# Patient Record
Sex: Male | Born: 1950 | Race: White | Hispanic: No | Marital: Married | Smoking: Former smoker
Health system: Southern US, Community
[De-identification: ages and names within clinical notes are randomized; demographics above are authoritative.]

## PROBLEM LIST (undated history)

## (undated) ENCOUNTER — Emergency Department (HOSPITAL_COMMUNITY): Admission: EM | Payer: Medicare Other

## (undated) DIAGNOSIS — J189 Pneumonia, unspecified organism: Secondary | ICD-10-CM

## (undated) DIAGNOSIS — I739 Peripheral vascular disease, unspecified: Secondary | ICD-10-CM

## (undated) DIAGNOSIS — I219 Acute myocardial infarction, unspecified: Secondary | ICD-10-CM

## (undated) DIAGNOSIS — I639 Cerebral infarction, unspecified: Secondary | ICD-10-CM

## (undated) DIAGNOSIS — K219 Gastro-esophageal reflux disease without esophagitis: Secondary | ICD-10-CM

## (undated) DIAGNOSIS — E041 Nontoxic single thyroid nodule: Secondary | ICD-10-CM

## (undated) DIAGNOSIS — E785 Hyperlipidemia, unspecified: Secondary | ICD-10-CM

## (undated) DIAGNOSIS — I714 Abdominal aortic aneurysm, without rupture, unspecified: Secondary | ICD-10-CM

## (undated) DIAGNOSIS — R51 Headache: Secondary | ICD-10-CM

## (undated) DIAGNOSIS — K4090 Unilateral inguinal hernia, without obstruction or gangrene, not specified as recurrent: Secondary | ICD-10-CM

## (undated) DIAGNOSIS — R0902 Hypoxemia: Secondary | ICD-10-CM

## (undated) DIAGNOSIS — J449 Chronic obstructive pulmonary disease, unspecified: Secondary | ICD-10-CM

## (undated) DIAGNOSIS — I10 Essential (primary) hypertension: Secondary | ICD-10-CM

## (undated) DIAGNOSIS — I2510 Atherosclerotic heart disease of native coronary artery without angina pectoris: Secondary | ICD-10-CM

## (undated) DIAGNOSIS — I509 Heart failure, unspecified: Secondary | ICD-10-CM

## (undated) DIAGNOSIS — I6529 Occlusion and stenosis of unspecified carotid artery: Secondary | ICD-10-CM

## (undated) HISTORY — PX: EXPLORATION POST OPERATIVE OPEN HEART: SHX5061

## (undated) HISTORY — DX: Abdominal aortic aneurysm, without rupture: I71.4

## (undated) HISTORY — DX: Peripheral vascular disease, unspecified: I73.9

## (undated) HISTORY — DX: Atherosclerotic heart disease of native coronary artery without angina pectoris: I25.10

## (undated) HISTORY — DX: Abdominal aortic aneurysm, without rupture, unspecified: I71.40

## (undated) HISTORY — DX: Pneumonia, unspecified organism: J18.9

## (undated) HISTORY — DX: Hypoxemia: R09.02

## (undated) HISTORY — DX: Hyperlipidemia, unspecified: E78.5

## (undated) HISTORY — DX: Occlusion and stenosis of unspecified carotid artery: I65.29

## (undated) HISTORY — PX: EXTERNAL EAR SURGERY: SHX627

## (undated) HISTORY — DX: Cerebral infarction, unspecified: I63.9

## (undated) HISTORY — DX: Chronic obstructive pulmonary disease, unspecified: J44.9

## (undated) HISTORY — DX: Essential (primary) hypertension: I10

## (undated) HISTORY — PX: HIP SURGERY: SHX245

---

## 2009-08-28 ENCOUNTER — Encounter: Admission: RE | Admit: 2009-08-28 | Discharge: 2009-08-28 | Payer: Self-pay | Admitting: Cardiovascular Disease

## 2009-09-02 ENCOUNTER — Ambulatory Visit (HOSPITAL_COMMUNITY): Admission: RE | Admit: 2009-09-02 | Discharge: 2009-09-02 | Payer: Self-pay | Admitting: Cardiovascular Disease

## 2009-09-25 HISTORY — PX: EXPLORATORY LAPAROTOMY: SUR591

## 2009-10-03 ENCOUNTER — Ambulatory Visit: Payer: Self-pay | Admitting: Pulmonary Disease

## 2009-10-03 ENCOUNTER — Encounter: Payer: Self-pay | Admitting: Surgery

## 2009-10-03 ENCOUNTER — Ambulatory Visit: Payer: Self-pay | Admitting: Surgery

## 2009-10-03 ENCOUNTER — Inpatient Hospital Stay (HOSPITAL_COMMUNITY): Admission: EM | Admit: 2009-10-03 | Discharge: 2009-10-31 | Payer: Self-pay | Admitting: Emergency Medicine

## 2009-10-03 HISTORY — PX: ABDOMINAL AORTIC ANEURYSM REPAIR: SUR1152

## 2009-10-10 ENCOUNTER — Encounter: Payer: Self-pay | Admitting: Surgery

## 2009-10-11 ENCOUNTER — Encounter: Payer: Self-pay | Admitting: Surgery

## 2009-10-11 ENCOUNTER — Ambulatory Visit: Payer: Self-pay | Admitting: Physical Medicine & Rehabilitation

## 2010-01-13 ENCOUNTER — Ambulatory Visit: Payer: Self-pay | Admitting: Surgery

## 2010-02-28 ENCOUNTER — Encounter
Admission: RE | Admit: 2010-02-28 | Discharge: 2010-04-24 | Payer: Self-pay | Source: Home / Self Care | Attending: Family Medicine | Admitting: Family Medicine

## 2010-04-24 ENCOUNTER — Encounter
Admission: RE | Admit: 2010-04-24 | Discharge: 2010-05-01 | Payer: Self-pay | Source: Home / Self Care | Attending: Family Medicine | Admitting: Family Medicine

## 2010-05-05 ENCOUNTER — Ambulatory Visit
Admission: RE | Admit: 2010-05-05 | Discharge: 2010-05-05 | Payer: Self-pay | Source: Home / Self Care | Attending: Surgery | Admitting: Surgery

## 2010-05-18 ENCOUNTER — Encounter: Payer: Self-pay | Admitting: Surgery

## 2010-06-13 ENCOUNTER — Emergency Department (HOSPITAL_COMMUNITY)
Admission: EM | Admit: 2010-06-13 | Discharge: 2010-06-13 | Disposition: A | Discharge status: Home / Self Care | Payer: Medicare Other | Attending: Emergency Medicine | Admitting: Emergency Medicine

## 2010-06-13 ENCOUNTER — Emergency Department (HOSPITAL_COMMUNITY): Payer: Medicare Other

## 2010-06-13 DIAGNOSIS — E785 Hyperlipidemia, unspecified: Secondary | ICD-10-CM | POA: Insufficient documentation

## 2010-06-13 DIAGNOSIS — I10 Essential (primary) hypertension: Secondary | ICD-10-CM | POA: Insufficient documentation

## 2010-06-13 DIAGNOSIS — I2510 Atherosclerotic heart disease of native coronary artery without angina pectoris: Secondary | ICD-10-CM | POA: Insufficient documentation

## 2010-06-13 DIAGNOSIS — R079 Chest pain, unspecified: Secondary | ICD-10-CM | POA: Insufficient documentation

## 2010-06-13 DIAGNOSIS — Z79899 Other long term (current) drug therapy: Secondary | ICD-10-CM | POA: Insufficient documentation

## 2010-06-13 DIAGNOSIS — Z8673 Personal history of transient ischemic attack (TIA), and cerebral infarction without residual deficits: Secondary | ICD-10-CM | POA: Insufficient documentation

## 2010-06-13 DIAGNOSIS — Z9889 Other specified postprocedural states: Secondary | ICD-10-CM | POA: Insufficient documentation

## 2010-06-13 LAB — COMPREHENSIVE METABOLIC PANEL
Alkaline Phosphatase: 79 U/L (ref 39–117)
CO2: 27 mEq/L (ref 19–32)
Creatinine, Ser: 0.8 mg/dL (ref 0.4–1.5)
GFR calc Af Amer: >60 mL/min (ref >60)
Glucose, Bld: 170 mg/dL — ABNORMAL HIGH (ref 70–99)
Sodium: 138 mEq/L (ref 135–145)
Total Bilirubin: 1.2 mg/dL (ref 0.3–1.2)
Total Protein: 7.6 g/dL (ref 6.0–8.3)

## 2010-06-13 LAB — POCT CARDIAC MARKERS
CKMB, poc: <1 ng/mL — ABNORMAL LOW (ref 1.0–8.0)
Myoglobin, poc: 31.6 ng/mL (ref 12–200)

## 2010-06-13 LAB — CBC
MCH: 34.1 pg — ABNORMAL HIGH (ref 26.0–34.0)
MCHC: 34.9 g/dL (ref 30.0–36.0)
MCV: 97.8 fL (ref 78.0–100.0)
Platelets: 272 10*3/uL (ref 150–400)
RBC: 4.95 MIL/uL (ref 4.22–5.81)

## 2010-06-19 ENCOUNTER — Emergency Department (HOSPITAL_COMMUNITY): Payer: Medicare Other

## 2010-06-19 ENCOUNTER — Inpatient Hospital Stay: Admission: AD | Admit: 2010-06-19 | Payer: Self-pay | Source: Ambulatory Visit | Admitting: Cardiovascular Disease

## 2010-06-19 ENCOUNTER — Inpatient Hospital Stay (HOSPITAL_COMMUNITY)
Admission: EM | Admit: 2010-06-19 | Discharge: 2010-06-29 | DRG: 235 | Disposition: A | Discharge status: Home Health | Payer: Medicare Other | Attending: Thoracic Surgery (Cardiothoracic Vascular Surgery) | Admitting: Thoracic Surgery (Cardiothoracic Vascular Surgery)

## 2010-06-19 DIAGNOSIS — Z7982 Long term (current) use of aspirin: Secondary | ICD-10-CM

## 2010-06-19 DIAGNOSIS — J9600 Acute respiratory failure, unspecified whether with hypoxia or hypercapnia: Secondary | ICD-10-CM | POA: Diagnosis not present

## 2010-06-19 DIAGNOSIS — D62 Acute posthemorrhagic anemia: Secondary | ICD-10-CM | POA: Diagnosis not present

## 2010-06-19 DIAGNOSIS — I200 Unstable angina: Secondary | ICD-10-CM | POA: Diagnosis present

## 2010-06-19 DIAGNOSIS — I2510 Atherosclerotic heart disease of native coronary artery without angina pectoris: Principal | ICD-10-CM | POA: Diagnosis present

## 2010-06-19 DIAGNOSIS — I10 Essential (primary) hypertension: Secondary | ICD-10-CM | POA: Diagnosis present

## 2010-06-19 DIAGNOSIS — D696 Thrombocytopenia, unspecified: Secondary | ICD-10-CM | POA: Diagnosis present

## 2010-06-19 DIAGNOSIS — Z8673 Personal history of transient ischemic attack (TIA), and cerebral infarction without residual deficits: Secondary | ICD-10-CM

## 2010-06-19 DIAGNOSIS — I6529 Occlusion and stenosis of unspecified carotid artery: Secondary | ICD-10-CM | POA: Diagnosis present

## 2010-06-19 DIAGNOSIS — R578 Other shock: Secondary | ICD-10-CM | POA: Diagnosis present

## 2010-06-19 DIAGNOSIS — F17200 Nicotine dependence, unspecified, uncomplicated: Secondary | ICD-10-CM | POA: Diagnosis present

## 2010-06-19 DIAGNOSIS — E785 Hyperlipidemia, unspecified: Secondary | ICD-10-CM | POA: Diagnosis present

## 2010-06-19 LAB — BRAIN NATRIURETIC PEPTIDE: Pro B Natriuretic peptide (BNP): <30 pg/mL (ref 0.0–100.0)

## 2010-06-19 LAB — CBC
MCHC: 34.6 g/dL (ref 30.0–36.0)
MCV: 100.2 fL — ABNORMAL HIGH (ref 78.0–100.0)
RBC: 4.93 MIL/uL (ref 4.22–5.81)
RDW: 12.8 % (ref 11.5–15.5)
WBC: 10.1 10*3/uL (ref 4.0–10.5)

## 2010-06-19 LAB — DIFFERENTIAL
Eosinophils Absolute: 0.1 10*3/uL (ref 0.0–0.7)
Lymphocytes Relative: 27 % (ref 12–46)
Lymphs Abs: 2.7 10*3/uL (ref 0.7–4.0)
Monocytes Absolute: 0.9 10*3/uL (ref 0.1–1.0)
Neutro Abs: 6.4 10*3/uL (ref 1.7–7.7)
Neutrophils Relative %: 63 % (ref 43–77)

## 2010-06-19 LAB — LIPID PANEL
Cholesterol: 188 mg/dL (ref 0–200)
Triglycerides: 306 mg/dL — ABNORMAL HIGH (ref <150)

## 2010-06-19 LAB — COMPREHENSIVE METABOLIC PANEL
AST: 24 U/L (ref 0–37)
Albumin: 3.8 g/dL (ref 3.5–5.2)
BUN: 9 mg/dL (ref 6–23)
CO2: 27 mEq/L (ref 19–32)
Creatinine, Ser: 0.82 mg/dL (ref 0.4–1.5)
Glucose, Bld: 84 mg/dL (ref 70–99)
Potassium: 4.8 mEq/L (ref 3.5–5.1)

## 2010-06-19 LAB — CK TOTAL AND CKMB (NOT AT ARMC): Relative Index: INVALID (ref 0.0–2.5)

## 2010-06-19 LAB — MRSA PCR SCREENING: MRSA by PCR: NEGATIVE

## 2010-06-19 LAB — APTT: aPTT: 35 seconds (ref 24–37)

## 2010-06-19 LAB — MAGNESIUM: Magnesium: 2.3 mg/dL (ref 1.5–2.5)

## 2010-06-20 DIAGNOSIS — I2510 Atherosclerotic heart disease of native coronary artery without angina pectoris: Secondary | ICD-10-CM

## 2010-06-20 DIAGNOSIS — I6529 Occlusion and stenosis of unspecified carotid artery: Secondary | ICD-10-CM

## 2010-06-20 DIAGNOSIS — Z01810 Encounter for preprocedural cardiovascular examination: Secondary | ICD-10-CM

## 2010-06-20 DIAGNOSIS — I712 Thoracic aortic aneurysm, without rupture: Secondary | ICD-10-CM

## 2010-06-20 LAB — CARDIAC PANEL(CRET KIN+CKTOT+MB+TROPI)
CK, MB: 0.8 ng/mL (ref 0.3–4.0)
Relative Index: INVALID (ref 0.0–2.5)
Relative Index: INVALID (ref 0.0–2.5)
Troponin I: 0.02 ng/mL (ref 0.00–0.06)

## 2010-06-20 LAB — CBC
HCT: 44 % (ref 39.0–52.0)
MCH: 33.9 pg (ref 26.0–34.0)
MCHC: 34.1 g/dL (ref 30.0–36.0)
Platelets: 233 10*3/uL (ref 150–400)
RBC: 4.43 MIL/uL (ref 4.22–5.81)
RDW: 12.9 % (ref 11.5–15.5)

## 2010-06-20 LAB — HEPARIN LEVEL (UNFRACTIONATED): Heparin Unfractionated: <0.1 IU/mL — ABNORMAL LOW (ref 0.30–0.70)

## 2010-06-21 ENCOUNTER — Inpatient Hospital Stay (HOSPITAL_COMMUNITY): Payer: Medicare Other

## 2010-06-21 LAB — CBC
HCT: 43.2 % (ref 39.0–52.0)
Hemoglobin: 14.5 g/dL (ref 13.0–17.0)
MCH: 33.7 pg (ref 26.0–34.0)
MCHC: 33.6 g/dL (ref 30.0–36.0)
MCV: 100.5 fL — ABNORMAL HIGH (ref 78.0–100.0)
RDW: 12.9 % (ref 11.5–15.5)

## 2010-06-21 LAB — CARDIAC PANEL(CRET KIN+CKTOT+MB+TROPI)
CK, MB: 0.6 ng/mL (ref 0.3–4.0)
Relative Index: INVALID (ref 0.0–2.5)
Troponin I: <0.01 ng/mL (ref 0.00–0.06)

## 2010-06-21 LAB — BASIC METABOLIC PANEL
CO2: 28 mEq/L (ref 19–32)
Creatinine, Ser: 0.88 mg/dL (ref 0.4–1.5)

## 2010-06-21 LAB — MRSA PCR SCREENING: MRSA by PCR: NEGATIVE

## 2010-06-21 MED ORDER — IOHEXOL 350 MG/ML SOLN
75.0000 mL | Freq: Once | INTRAVENOUS | Status: AC | PRN
  Administered 2010-06-21: 09:00:00 75 mL via INTRAVENOUS

## 2010-06-21 MED ORDER — IOHEXOL 350 MG/ML SOLN
75.0000 mL | Freq: Once | INTRAVENOUS | Status: AC | PRN
  Administered 2010-06-21: 75 mL via INTRAVENOUS

## 2010-06-21 NOTE — Consult Note (Signed)
NAME:  Chris Payne, Chris Payne NO.:  [id number]  MEDICAL RECORD NO.:  [id number]           PATIENT TYPE:  LOCATION:                                 FACILITY:  PHYSICIAN:  Fransisco Hertz, MD       DATE OF BIRTH:  September 29, 1950  DATE OF CONSULTATION: DATE OF DISCHARGE:                                CONSULTATION   REQUESTING PHYSICIAN:  Salvatore Decent. Dorris Fetch, MD  REASON FOR CONSULTATION:  Left carotid artery stenosis greater than 80%.  HISTORY OF PRESENT ILLNESS:  This is a 60 year old gentleman well known to Vascular Surgery service from previous abdominal aortic aneurysm repair by Dr. Myra Gianotti.  He presented yesterday with sharp chest pain and was brought back to the cardiac cath suite today, and underwent a cardiac catheterization demonstrating multiple vessel coronary artery disease that will require intervention by Cardiothoracic Surgery.  In addition, on this study, there was a possible ascending aortic aneurysm identified, so at this point, the cardiac service is considering on combined CABG and ascending aortic aneurysm replacement.  Additionally as part of the preoperative workup, this patient underwent a bilateral carotid artery duplex which demonstrated a greater than 80% stenosis in  the left internal carotid artery.  The consultation is for evaluation of  this stenosis.  The patient notes that previously he had had a pontine stroke  after his abdominal aortic aneurysm repair and has recovered most function, except for some residual weakness in his left leg and also some dysphagia that he has been effectively trained to deal with.  This patient is able to ambulate at this point and complete his activities of daily living.  He is able to swallow without any aspiration.  Today, he denies any new stroke or TIA symptomatology, specifically no monocular blindness, no amaurosis fugax.  He has never had recently any type of facial drooping or hemiplegia.  He denies any  receptive or expressive aphasia.  His carotid risk factors include hypertension, hyperlipidemia, and extensive tobacco abuse history.  PAST MEDICAL HISTORY: 1. Hypertension. 2. Hyperlipidemia. 3. Coronary artery disease. 4. History of abdominal aortic aneurysm. 5. Some type of left hip osteoarthritis. 6. Chronic tobacco abuse. 7. Pontine stroke.  PAST SURGICAL HISTORY:  Some type of left hip pinning.  He has also underwent abdominal aortic aneurysm repair with reimplantation of accessory right renal artery and reimplantation of inferior mesenteric artery.  He also underwent exploratory laparotomy and ligation of lumbar arteries.  SOCIAL HISTORY:  This patient smoked for a greater than 30-pack-year history, only recently quit smoking.  In terms of alcohol, he notes drank a couple of 6 packs on the weekend and only recently quit about 10 months ago.  He denies any illicit drug use.  FAMILY HISTORY:  Mother died of old age.  Father died of complications related to alcoholism.  REVIEW OF SYSTEMS:  As listed above, otherwise was noted to be negative on all system review.  MEDICATIONS:  He came in on folic acid, thiamine, Celebrex, and Lortab.  He had no known drug allergies.  PHYSICAL EXAMINATION:  VITAL SIGNS:  Temperature 97.8, blood pressure  125/68 with a heart rate of 69, respirations were 12.  He currently is on both heparin drip and also a nitroglycerin drip. GENERAL:  He appears his stated age, looks ill appearing, alert and oriented x3. HEENT:  Head, normocephalic and atraumatic.   ENT: hearing is grossly intact.  His oropharynx with some erythema without any exudate.  He has an upper denture plate.  His nares had no erythema or any drainage. EYES: his pupils were equal, round, and reactive to light.  Extraocular movements were intact. NECK:  Supple without any nuchal rigidity.  No obvious lymphadenopathy. No obvious JVD. PULMONARY:  He had symmetric expansion.  Good  air movement.  There were definite rales on exhalation rubbing down with end-exhalation and no obvious wheezes or stridor was noted, however. CARDIAC:  He had a regular rate and rhythm.  Normal S1 and S2.  No murmurs, rubs, thrills, or gallops were appreciated. VASCULAR:  He had easily palpable radial and brachial pulses.  Carotids were palpable.  He had no bruit in either carotid.  The aorta was not palpable in the midline.  He had palpable left femoral pulse.  I did not palpate the right one as it was under a bandage from recent cardiac catheterization.  He has an easily palpable right popliteal artery, the left was not palpable.  I could feel pedal pulses in the right leg but not his left leg. ABDOMEN:  He has a well-healed midline scar.  He has had no obvious masses.  No tenderness to palpation.  No guarding, no rebound, no hepatosplenomegaly appreciated. MUSCULOSKELETAL:  He had 5/5 strength in the upper arms, his legs are 5/5 also, but the left leg is weaker than the right.  Looking at the legs, there are no ischemic changes noted.  His left foot has about 1+ edema noted. NEUROLOGIC:  Cranial nerves II through XII are grossly intact.  His motor was as listed above.  Sensation is grossly intact in all extremities. PSYCHIATRIC:  His judgment was intact.  Mood and affect were appropriate for his clinical situation. SKIN:  His extremities were as listed above, otherwise the rest of his body I did not appreciate any rashes. LYMPHATIC:  He had no cervical, axillary, or inguinal lymphadenopathy.  This gentleman had some laboratory studies including a recent cardiac panel with a CK of 331 and MB fraction of 0.6, troponin was only 0.01. He had a CBC white count of 6.9, hemoglobin of 15.0 and a hematocrit of 44.0, and a platelet count of 233.  He had a total cholesterol of 188 with a triglyceride of 300, HDL of 35 with LDL 92.  He had a comprehensive metabolic panel with a sodium of 140,  potassium of 4.8, chloride of 103, bicarb of 27, glucose of 84, BUN was 9, creatinine is 0.8.  His total bilirubin was 0.8, alkaline phos was 91, AST 24, ALT is 19, total protein 7.3, albumin is 3.9, calcium is 9.4.  BNP was less than 3.0.  Noninvasive vascular imaging.  He had bilateral carotid duplex completed.  I completed the final interpretation on the patient's carotid duplex.  The right internal carotid artery demonstrates no hemodynamically significant stenosis.  However, on his left side, the velocities escalated up to 340 cm/sec with diastolics of 137 c/s.  This is a significant step up from previously velocity and ICA:CCA ratio would be consistent with greater than 80% stenosis in the left internal carotid artery.  MEDICAL DECISION-MAKING:  This is a 60 year old  gentleman with coronary artery disease and also possibly ascending aortic aneurysm based on  his cardiac catheterization.  In reviewing the chart, it is evident that CT Surgery has already requested a CTA of the chest, so I would extend the study to include a CTA of the neck.  Given the significant step-up of velocities over a less than 1-year period, I would confirm the in fact this patient does have a left internal carotid artery stenosis greater than 80% prior to any preoperative planning in regards to management of this.  If this is greater than 80%, the options would include endarterectomy versus carotid artery stenting obviously with the need to do such an extensive surgical procedure there will be concern with use of Plavix which is necessary after carotid artery stenting. One consideration would be to undertake the cardiac procedure and possible ascending aortic aneurysm repair first and then a later time proceed forward with a carotid artery stenting.  Ultimately, any surgical plans will be deferred to Dr. Myra Gianotti as he has been managing this patient on a long-term basis.  Additionally, I would first confirm  whether in fact this is a left carotid artery stenosis greater than 80% given the significant increase in velocities and the dependence of the operator expertise with carotid duplex.  Thank you for giving [place] the opportunity participate in his care.  We will be following along with this patient's progress.  There are no immediate plans to intervene over the weekend.     Fransisco Hertz, MD     BLC/MEDQ  D:  06/20/2010  T:  06/21/2010  Job:  [id number]  Electronically Signed by Leonides Sake MD on 06/21/2010 05:03:33 PM

## 2010-06-22 LAB — CBC
HCT: 44.9 % (ref 39.0–52.0)
Hemoglobin: 14.9 g/dL (ref 13.0–17.0)
MCH: 33.4 pg (ref 26.0–34.0)
MCHC: 33.2 g/dL (ref 30.0–36.0)
MCV: 100.7 fL — ABNORMAL HIGH (ref 78.0–100.0)
Platelets: 220 K/uL (ref 150–400)
RBC: 4.46 MIL/uL (ref 4.22–5.81)
RDW: 12.7 % (ref 11.5–15.5)
WBC: 10.2 K/uL (ref 4.0–10.5)

## 2010-06-22 LAB — HEPARIN LEVEL (UNFRACTIONATED): Heparin Unfractionated: 0.28 IU/mL — ABNORMAL LOW (ref 0.30–0.70)

## 2010-06-23 ENCOUNTER — Inpatient Hospital Stay (HOSPITAL_COMMUNITY): Payer: Medicare Other

## 2010-06-23 DIAGNOSIS — Z01810 Encounter for preprocedural cardiovascular examination: Secondary | ICD-10-CM

## 2010-06-23 DIAGNOSIS — J479 Bronchiectasis, uncomplicated: Secondary | ICD-10-CM

## 2010-06-23 DIAGNOSIS — J449 Chronic obstructive pulmonary disease, unspecified: Secondary | ICD-10-CM

## 2010-06-23 DIAGNOSIS — F17200 Nicotine dependence, unspecified, uncomplicated: Secondary | ICD-10-CM

## 2010-06-23 DIAGNOSIS — R0902 Hypoxemia: Secondary | ICD-10-CM

## 2010-06-23 DIAGNOSIS — I2510 Atherosclerotic heart disease of native coronary artery without angina pectoris: Secondary | ICD-10-CM

## 2010-06-23 LAB — BLOOD GAS, ARTERIAL
Acid-Base Excess: 3.2 mmol/L — ABNORMAL HIGH (ref 0.0–2.0)
Bicarbonate: 26.8 mEq/L — ABNORMAL HIGH (ref 20.0–24.0)
Drawn by: 205171
FIO2: 0.21 %
O2 Saturation: 89.7 %
Patient temperature: 98.6
Patient temperature: 98.6
TCO2: 28.6 mmol/L (ref 0–100)
pH, Arterial: 7.42 (ref 7.350–7.450)
pH, Arterial: 7.432 (ref 7.350–7.450)
pO2, Arterial: 61.5 mmHg — ABNORMAL LOW (ref 80.0–100.0)

## 2010-06-23 LAB — CBC
HCT: 41.7 % (ref 39.0–52.0)
MCHC: 33.3 g/dL (ref 30.0–36.0)
RDW: 12.8 % (ref 11.5–15.5)

## 2010-06-23 LAB — URINALYSIS, ROUTINE W REFLEX MICROSCOPIC
Bilirubin Urine: NEGATIVE
Nitrite: NEGATIVE
Protein, ur: NEGATIVE mg/dL
Specific Gravity, Urine: 1.016 (ref 1.005–1.030)
Urobilinogen, UA: 1 mg/dL (ref 0.0–1.0)

## 2010-06-23 LAB — BASIC METABOLIC PANEL
BUN: 14 mg/dL (ref 6–23)
Calcium: 8.9 mg/dL (ref 8.4–10.5)
Creatinine, Ser: 0.81 mg/dL (ref 0.4–1.5)
GFR calc non Af Amer: >60 mL/min (ref >60)
Glucose, Bld: 92 mg/dL (ref 70–99)
Sodium: 136 mEq/L (ref 135–145)

## 2010-06-24 ENCOUNTER — Inpatient Hospital Stay (HOSPITAL_COMMUNITY): Payer: Medicare Other

## 2010-06-24 ENCOUNTER — Other Ambulatory Visit: Payer: Self-pay | Admitting: Thoracic Surgery (Cardiothoracic Vascular Surgery)

## 2010-06-24 DIAGNOSIS — I2510 Atherosclerotic heart disease of native coronary artery without angina pectoris: Secondary | ICD-10-CM

## 2010-06-24 HISTORY — PX: CORONARY ARTERY BYPASS GRAFT: SHX141

## 2010-06-24 LAB — POCT I-STAT 3, ART BLOOD GAS (G3+)
O2 Saturation: 100 %
TCO2: 26 mmol/L (ref 0–100)

## 2010-06-24 LAB — BASIC METABOLIC PANEL
BUN: 15 mg/dL (ref 6–23)
CO2: 27 mEq/L (ref 19–32)
Calcium: 9.1 mg/dL (ref 8.4–10.5)
Chloride: 103 mEq/L (ref 96–112)
Creatinine, Ser: 0.94 mg/dL (ref 0.4–1.5)
GFR calc Af Amer: >60 mL/min (ref >60)
Glucose, Bld: 102 mg/dL — ABNORMAL HIGH (ref 70–99)

## 2010-06-24 LAB — HEMOGLOBIN AND HEMATOCRIT, BLOOD
HCT: 34.5 % — ABNORMAL LOW (ref 39.0–52.0)
Hemoglobin: 11.6 g/dL — ABNORMAL LOW (ref 13.0–17.0)

## 2010-06-24 LAB — POCT I-STAT 4, (NA,K, GLUC, HGB,HCT)
Glucose, Bld: 111 mg/dL — ABNORMAL HIGH (ref 70–99)
Glucose, Bld: 82 mg/dL (ref 70–99)
Glucose, Bld: 112 mg/dL — ABNORMAL HIGH (ref 70–99)
HCT: 39 % (ref 39.0–52.0)
HCT: 27 % — ABNORMAL LOW (ref 39.0–52.0)
HCT: 28 % — ABNORMAL LOW (ref 39.0–52.0)
Hemoglobin: 13.6 g/dL (ref 13.0–17.0)
Hemoglobin: 13.3 g/dL (ref 13.0–17.0)
Hemoglobin: 9.5 g/dL — ABNORMAL LOW (ref 13.0–17.0)
Potassium: 4.2 mEq/L (ref 3.5–5.1)
Potassium: 4 mEq/L (ref 3.5–5.1)
Potassium: 5.1 mEq/L (ref 3.5–5.1)
Sodium: 137 mEq/L (ref 135–145)
Sodium: 136 mEq/L (ref 135–145)

## 2010-06-24 LAB — SURGICAL PCR SCREEN: MRSA, PCR: NEGATIVE

## 2010-06-24 LAB — CBC
HCT: 43.3 % (ref 39.0–52.0)
Hemoglobin: 14.7 g/dL (ref 13.0–17.0)
MCH: 34.2 pg — ABNORMAL HIGH (ref 26.0–34.0)
MCH: 33.5 pg (ref 26.0–34.0)
MCH: 33.5 pg (ref 26.0–34.0)
MCHC: 33.9 g/dL (ref 30.0–36.0)
MCHC: 33.9 g/dL (ref 30.0–36.0)
MCV: 100.8 fL — ABNORMAL HIGH (ref 78.0–100.0)
Platelets: 123 10*3/uL — ABNORMAL LOW (ref 150–400)
Platelets: 136 10*3/uL — ABNORMAL LOW (ref 150–400)
RBC: 3.7 MIL/uL — ABNORMAL LOW (ref 4.22–5.81)
RDW: 12.8 % (ref 11.5–15.5)
RDW: 12.6 % (ref 11.5–15.5)

## 2010-06-24 LAB — GLUCOSE, CAPILLARY
Glucose-Capillary: 97 mg/dL (ref 70–99)
Glucose-Capillary: 107 mg/dL — ABNORMAL HIGH (ref 70–99)

## 2010-06-24 LAB — MAGNESIUM: Magnesium: 3.5 mg/dL — ABNORMAL HIGH (ref 1.5–2.5)

## 2010-06-24 LAB — PROTIME-INR: Prothrombin Time: 16 seconds — ABNORMAL HIGH (ref 11.6–15.2)

## 2010-06-24 LAB — POCT I-STAT GLUCOSE: Operator id: 3406

## 2010-06-25 ENCOUNTER — Inpatient Hospital Stay (HOSPITAL_COMMUNITY): Payer: Medicare Other

## 2010-06-25 LAB — POCT I-STAT 3, ART BLOOD GAS (G3+)
Acid-base deficit: 2 mmol/L (ref 0.0–2.0)
Bicarbonate: 24.3 mEq/L — ABNORMAL HIGH (ref 20.0–24.0)
Patient temperature: 38
TCO2: 25 mmol/L (ref 0–100)
TCO2: 26 mmol/L (ref 0–100)
TCO2: 25 mmol/L (ref 0–100)
pCO2 arterial: 40.1 mmHg (ref 35.0–45.0)
pCO2 arterial: 44.4 mmHg (ref 35.0–45.0)
pH, Arterial: 7.383 (ref 7.350–7.450)
pH, Arterial: 7.347 — ABNORMAL LOW (ref 7.350–7.450)
pO2, Arterial: 62 mmHg — ABNORMAL LOW (ref 80.0–100.0)

## 2010-06-25 LAB — POCT I-STAT 4, (NA,K, GLUC, HGB,HCT)
Hemoglobin: 12.2 g/dL — ABNORMAL LOW (ref 13.0–17.0)
Potassium: 4.4 mEq/L (ref 3.5–5.1)
Sodium: 139 mEq/L (ref 135–145)

## 2010-06-25 LAB — POCT I-STAT, CHEM 8
BUN: 11 mg/dL (ref 6–23)
BUN: 16 mg/dL (ref 6–23)
Calcium, Ion: 1.3 mmol/L (ref 1.12–1.32)
Calcium, Ion: 1.24 mmol/L (ref 1.12–1.32)
Chloride: 99 mEq/L (ref 96–112)
Creatinine, Ser: 0.8 mg/dL (ref 0.4–1.5)
Glucose, Bld: 99 mg/dL (ref 70–99)
HCT: 33 % — ABNORMAL LOW (ref 39.0–52.0)
Hemoglobin: 11.2 g/dL — ABNORMAL LOW (ref 13.0–17.0)
Potassium: 4.4 mEq/L (ref 3.5–5.1)
TCO2: 22 mmol/L (ref 0–100)
TCO2: 24 mmol/L (ref 0–100)

## 2010-06-25 LAB — GLUCOSE, CAPILLARY

## 2010-06-25 LAB — CBC
HCT: 34.2 % — ABNORMAL LOW (ref 39.0–52.0)
Hemoglobin: 11.1 g/dL — ABNORMAL LOW (ref 13.0–17.0)
MCH: 32.6 pg (ref 26.0–34.0)
MCHC: 32.5 g/dL (ref 30.0–36.0)
MCV: 100.3 fL — ABNORMAL HIGH (ref 78.0–100.0)
MCV: 101.2 fL — ABNORMAL HIGH (ref 78.0–100.0)
Platelets: 135 10*3/uL — ABNORMAL LOW (ref 150–400)
RBC: 3.27 MIL/uL — ABNORMAL LOW (ref 4.22–5.81)
RDW: 13.2 % (ref 11.5–15.5)
WBC: 11.6 10*3/uL — ABNORMAL HIGH (ref 4.0–10.5)

## 2010-06-25 LAB — CREATININE, SERUM
GFR calc Af Amer: >60 mL/min (ref >60)
GFR calc non Af Amer: >60 mL/min (ref >60)

## 2010-06-25 LAB — BASIC METABOLIC PANEL
CO2: 25 mEq/L (ref 19–32)
Calcium: 8.5 mg/dL (ref 8.4–10.5)
Creatinine, Ser: 0.95 mg/dL (ref 0.4–1.5)
GFR calc Af Amer: >60 mL/min (ref >60)
Glucose, Bld: 106 mg/dL — ABNORMAL HIGH (ref 70–99)

## 2010-06-26 ENCOUNTER — Inpatient Hospital Stay (HOSPITAL_COMMUNITY): Payer: Medicare Other

## 2010-06-26 DIAGNOSIS — J4489 Other specified chronic obstructive pulmonary disease: Secondary | ICD-10-CM

## 2010-06-26 DIAGNOSIS — F17200 Nicotine dependence, unspecified, uncomplicated: Secondary | ICD-10-CM

## 2010-06-26 DIAGNOSIS — J449 Chronic obstructive pulmonary disease, unspecified: Secondary | ICD-10-CM

## 2010-06-26 DIAGNOSIS — Z951 Presence of aortocoronary bypass graft: Secondary | ICD-10-CM

## 2010-06-26 LAB — BASIC METABOLIC PANEL
CO2: 28 mEq/L (ref 19–32)
Chloride: 102 mEq/L (ref 96–112)
GFR calc Af Amer: >60 mL/min (ref >60)
Glucose, Bld: 110 mg/dL — ABNORMAL HIGH (ref 70–99)
Sodium: 136 mEq/L (ref 135–145)

## 2010-06-26 LAB — GLUCOSE, CAPILLARY
Glucose-Capillary: 124 mg/dL — ABNORMAL HIGH (ref 70–99)
Glucose-Capillary: 121 mg/dL — ABNORMAL HIGH (ref 70–99)

## 2010-06-26 LAB — CBC
HCT: 32.6 % — ABNORMAL LOW (ref 39.0–52.0)
Hemoglobin: 10.7 g/dL — ABNORMAL LOW (ref 13.0–17.0)
MCH: 32.7 pg (ref 26.0–34.0)
RBC: 3.27 MIL/uL — ABNORMAL LOW (ref 4.22–5.81)

## 2010-06-27 ENCOUNTER — Inpatient Hospital Stay (HOSPITAL_COMMUNITY): Payer: Medicare Other

## 2010-06-27 LAB — CROSSMATCH: Unit division: 0

## 2010-06-27 LAB — CBC
HCT: 35 % — ABNORMAL LOW (ref 39.0–52.0)
Hemoglobin: 11.4 g/dL — ABNORMAL LOW (ref 13.0–17.0)
RBC: 3.49 MIL/uL — ABNORMAL LOW (ref 4.22–5.81)

## 2010-06-27 LAB — BASIC METABOLIC PANEL
GFR calc non Af Amer: >60 mL/min (ref >60)
Glucose, Bld: 113 mg/dL — ABNORMAL HIGH (ref 70–99)
Potassium: 4.7 mEq/L (ref 3.5–5.1)
Sodium: 132 mEq/L — ABNORMAL LOW (ref 135–145)

## 2010-06-27 NOTE — Procedures (Signed)
NAME:  Chris Payne, Chris Payne               ACCOUNT NO.:  [id number]  MEDICAL RECORD NO.:  [id number]           PATIENT TYPE:  LOCATION:                                 FACILITY:  PHYSICIAN:  Thurmon Fair, MD     DATE OF BIRTH:  1950/12/09  DATE OF PROCEDURE: DATE OF DISCHARGE:                           CARDIAC CATHETERIZATION   PROCEDURES PERFORMED: 1. Left heart catheterization. 2. Selective coronary angiography. 3. Selective left subclavian angiography. 4. Left ventriculography.  Chris Payne is a 60 year old man with extensive history of coronary artery disease and peripheral vascular disease.  By previous cardiac catheterization it is known to have a chronic total occlusion of the right coronary artery and severe disease in left coronary system.  He now presents with unstable angina.  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  After risks and benefits of the procedure were described, the patient provided informed consent, he was brought to the cardiac cath lab in fasting state.  Using the modified Seldinger technique, a 5-French right common femoral artery sheath was introduced with minimal difficulty. Using a 5-French JL-4 catheter, the aortic root was reached, but the catheter was unable to cannulate the left coronary artery due to a very dilated ascending aorta.  The catheter was subsequently exchanged for JL- 5 catheter.  This provided satisfactory cannulation of left main coronary artery, but severe dampening was noted with ventricularization of the pressure waveform.  A single angiogram was performed, this demonstrated severe ostial stenosis of the left main coronary artery estimated to be at least 80% in severity.  Further angiograms were therefore not performed.  A JR-4 catheter was used to perform nonselective angiogram of the remnants of the right coronary artery.   Only infundibular branch was seen that provided some collateral flow to the distal right coronary artery.  The vessel was too small to selectively cannulate.  The same JR catheter was used to selectively cannulate the left subclavian artery and perform a nonselective injection, which showed that the left internal mammary artery and proximal left subclavian artery were healthy.  The mammary artery was a large vessel.  Finally, an angled pigtail catheter was used across the aortic valve and perform a hand injection left ventriculogram.  A pullback across the aortic valve did not show any evidence of aortic stenosis.  Left ventricle end-diastolic pressure was 24 mmHg.  No immediate complications occurred.  FINDINGS: 1. The left main coronary artery is calcified and has an approximately     80% ostial stenosis.  A 5-French catheter is virtually occlusive.     Since only one angiogram in the LAO caudal projection was     performed, additional information is gleaned from a previous     cardiac catheterization performed in May of 2011. 2. The left anterior descending artery is not well-visualized on the     current study, but on the previous study, was found to have an     approximately 60% mid vessel stenosis.  A single major diagonal     artery  is seen and this itself has a 80% ostial stenosis. 3. The left circumflex coronary artery is a small to medium sized     vessel that provides a single important oblique marginal artery.     There is a 90% ostial stenosis of the left circumflex coronary     artery. 4. There was a medium to large ramus intermedius artery that has     approximately 80% ostial stenosis. 5. The right coronary artery is a dominant vessel but is totally     occluded immediately following infundibular branch.  Excellent     collaterals from the septal branches of the LAD artery and mediocre     collaterals from the infundibular branch of the right coronary     artery are  seen filling the distal PDA artery.  The left ventriculogram was of poor quality and virtually uninterpretable for the purposes of left ventricular systolic function.  CONCLUSION:  Chris Payne has extensive and severe coronary artery disease including ostial 80% stenosis of the left main coronary artery and a chronic total occlusion of the right coronary artery.  In addition, there is ostial disease in the left circumflex and ramus intermedius arteries, as well as stenosis in the mid LAD artery.  He will be best served by multivessel coronary artery bypass surgery.  Before this is performed, attention should be given to evaluate the ascending aorta since there appears to be an aneurysm in this area.  A CT angiogram will be performed tomorrow.  Reevaluation of known carotid artery disease is also in order.  An echocardiogram will be performed to better evaluate left ventricular regional wall motion and systolic function.     Thurmon Fair, MD     MC/MEDQ  D:  06/20/2010  T:  06/21/2010  Job:  [id number]  cc:   [facility] V. Charlena Cross, MD  Electronically Signed by Thurmon Fair M.D. on 06/27/2010 12:55:29 PM

## 2010-06-29 NOTE — Op Note (Signed)
  NAME:  Chris Payne, Chris Payne NO.:  [id number]  MEDICAL RECORD NO.:  [id number]           PATIENT TYPE:  LOCATION:                                 FACILITY:  PHYSICIAN:  Zenon Mayo, MDDATE OF BIRTH:  [birth year]  DATE OF PROCEDURE:  06/24/2010 DATE OF DISCHARGE:                              OPERATIVE REPORT   PROCEDURE:  Transesophageal echocardiogram.  INDICATION:  Coronary artery disease.  DESCRIPTION:  [name] is a 60 year old gentleman with coronary artery disease who was brought to the operating room today by Dr. Dorris Fetch for coronary artery bypass grafting.  Intraoperative echocardiogram was requested to evaluate possible aortic valve disease.  The patient was brought to the operating room and placed under general anesthesia. After confirmation of endotracheal tube placement and orogastric suctioning, a transesophageal echo probe was placed into the patient's esophagus without complication.  The left ventricle was assessed first and revealed a left ventricular wall with mild concentric hypertrophy. There did not appear to be any wall motion abnormalities.  The estimated ejection fraction was 55%.  The aortic valve was imaged next.  The aortic valve was trileaflet in nature with no calcifications or vegetations seen.  The valve did appear to be mildly dilated, however, there was no aortic insufficiency seen and the leaflets did appear to coapt well.  Upon further evaluation of the aortic root, there was some mild dilatation seen.  The sinus of Valsalva measured 4.0 cm with the sinotubular ridge measuring 3.4 cm and the ascending aorta measuring 3.4 cm, all of which indicated some mild-to-moderate dilatation of the aortic root.  The mitral valve was imaged next.  This valve and the annulus did not appear to have any calcifications or vegetations.  The valve leaflets appeared to coapt well.  When color Doppler was placed across the valve, there  were multiple small jets centrally located consistent with mild mitral regurgitation.  The pulmonic valve was imaged next which was normal in both structure and function.  The tricuspid valve was also normal in structure and function.  The interatrial septum was intact.  The left atrial appendage was imaged and was free from thrombus.  The descending aorta was free from atherosclerotic disease.  At the conclusion of coronary artery bypass grafting and weaning from the bypass machine, the heart was again assessed.  There were no significant changes to any of the pre-bypass evaluations.  The left ventricle remained vigorous without any inotropic support.  The patient weaned from bypass without complication and had an uncomplicated post bypass course.  At the conclusion of the procedure, the echo probe was removed from the patient's esophagus without evidence of trauma.  The patient was taken directly from the operating room to the Intensive Care Unit in stable condition.          ______________________________ Zenon Mayo, MD WEF/MEDQ  D:  06/24/2010  T:  06/25/2010  Job:  [id number]  cc:   Anesthesia Medical illustrator by W. Khadeeja Elden MD on 06/29/2010 09:46:41 AM

## 2010-07-01 NOTE — Consult Note (Addendum)
NAME:  Chris Payne, Chris Payne               ACCOUNT NO.:  [id number]  MEDICAL RECORD NO.:  [id number]           PATIENT TYPE:  LOCATION:                                 FACILITY:  PHYSICIAN:  Salvatore Decent. Dorris Fetch, M.D.DATE OF BIRTH:  1950-09-24  DATE OF CONSULTATION:  06/20/2010 DATE OF DISCHARGE:                                CONSULTATION   REASON FOR CONSULTATION:  Left main disease with unstable angina.  HISTORY OF PRESENT ILLNESS:  Chris Payne is a 60 year old gentleman with a history of multiple cardiac risk factors and severe atherosclerotic cardiovascular disease.  He has a history of known coronary artery disease with a 40% left main and chronic totally occluded right coronary with an ejection fraction of 50% noted back in May 2011 and June 2011. Around that time, he presented with abdominal pain and had aortobifemoral bypass grafting for a symptomatic abdominal aortic aneurysm that was complicated by postoperative bleeding, requiring take back to the operating room and also pontine stroke which eventually recovered with rehab although he does have some slight residual left- sided weakness.  He initially was having trouble with speech and swallowing, but those have returned to normal.  The patient was doing fairly well until Friday last week when he noted he had chest pain, this initially came on at rest, and he has had intermittently for the past week.  He saw Dr. Allyson Sabal yesterday and was admitted today, underwent cardiac catheterization by Dr. Royann Shivers, which was a limited study, but did show chronic total occlusion of the right coronary.  There was an 80% ostial left main stenosis.  There are known tight lesions in the ramus with an ostial 80% lesion, circumflex with 90% ostial lesion, and a 60% mid LAD lesion.  The patient currently is without chest pain.  PAST MEDICAL HISTORY:  Significant for hypertension, hyperlipidemia, atherosclerotic cardiovascular disease status post  aortobifemoral bypass graft, previous pontine CVA, COPD, tobacco abuse.  HOME MEDICATIONS: 1. Folic acid 1 mg daily. 2. Thiamine 100 mg daily. 3. Simvastatin 20 mg daily. 4. Nexium 40 mg daily. 5. Multivitamin 1 daily. 6. Lortab 10/500 one tablet every 6 hours. 7. Celebrex 200 mg daily. 8. Colace 100 mg b.i.d. 9. Aspirin 81 mg daily.  FAMILY HISTORY:  Notable for atherosclerotic cardiovascular disease.  SOCIAL HISTORY:  The patient lives at home with his wife.  He was smoking actively until yesterday, but says he was down to about 4 cigarettes a day after previous history of heavy smoking.  REVIEW OF SYSTEMS:  Residual swelling in the left leg, some mild residual weakness on the left side, otherwise negative except as noted in the HPI.  PHYSICAL EXAMINATION:  GENERAL:  Chris Payne is a 60 year old gentleman, in no acute distress. VITAL SIGNS:  Blood pressure is 136/84, pulse 67, respirations 15, his oxygen saturation is 96% on 2 liters. NEUROLOGICAL:  The patient is alert and oriented x3, with slight weakness with dorsiflexion on the left foot.  Complete motor exam was not performed. HEENT:  Unremarkable. NECK:  Supple without thyromegaly, adenopathy, or bruits. CARDIAC:  He has regular rate and rhythm.  Normal  S1 and S2.  No rubs, murmurs, or gallops although his heart sounds are very difficult to auscultate due to bilateral rhonchi in the lungs. ABDOMEN:  Soft, nontender.  Well-healed surgical scar in the abdomen and both groins. EXTREMITIES:  He has faintly palpable dorsalis pedis pulse on the right, not able to palpate pulses on the left.  He has trace edema on the right and 1+ on the left.  LABORATORY DATA:  MRSA screening negative.  CK was 27, MB 0.8, troponin 0.01.  White count 6.9, hematocrit 44, platelets 233.  Cholesterol 188, triglycerides 306, HDL was 35, LDL 92.  Sodium 140, potassium 4.8, BUN 9, creatinine 0.82, glucose was 84.  BNP less than 30, PT 13.2, PTT  35. CT angio of the chest from June of last year was reviewed, documented a 4.5-cm aortic root although on the coronal, it appears that true diameters in the range of 4.0 cm.  IMPRESSION:  Chris Payne is a 60 year old gentleman who presents with unstable angina.  He has an 80% ostial left main stenosis in the setting of known preexisting 3-vessel coronary artery disease with impaired left ventricular function.  Coronary artery bypass grafting is indicated for survival benefit and relief of symptoms.  Unfortunately, he also has enlarged aorta and history of symptomatic abdominal aortic aneurysm, requiring repair.  He may well need his ascending aorta and possible his aortic root replaced at the time of his bypass surgery, but we need another CT angio to reassess that area and determine if that is indeed necessary.  Certainly that would significantly increase complexity time and risk of the surgery.  He is at increased risk for perioperative CVA given his history of a pontine stroke roughly 8 months ago.  I discussed in detail with the patient and his wife the indications, risks, benefits, and alternative treatments.  They understand the final determination will depend on the results of the CT angio and intraoperative assessment as to whether the aorta needs to be replaced at the same time.  He understands the risks of surgery include but not limited to death, which in his case likely 3-5% range, stroke that is probably in the 10% range, DVT, PE, bleeding, possible need for transfusions, infections, as well as other organ system dysfunction including respiratory, renal, hepatic, or GI complications.  He understands and accepts these risks and does agree to proceed.  We will await the results of the CT angio of the chest, also carotid studies, and echocardiogram.  We will also go ahead and schedule him for pulmonary function testing with a room air blood gas.  At the present time, it  appears the first available operating time will be Thursday, June 26, 2010, we will do this sooner that if time were to become available.     Salvatore Decent Dorris Fetch, M.D.     SCH/MEDQ  D:  06/20/2010  T:  06/21/2010  Job:  [id number]  cc:   Nanetta Batty, M.D. Jorge Ny, MD Marcos Eke. Hal Hope, M.D.  Electronically Signed by Charlett Lango M.D. on 07/01/2010 12:31:25 PM Electronically Signed by Charlett Lango M.D. on 07/01/2010 12:49:54 PM Electronically Signed by Charlett Lango M.D. on 07/01/2010 01:07:55 PM Electronically Signed by Charlett Lango M.D. on 07/01/2010 01:28:52 PM Electronically Signed by Charlett Lango M.D. on 07/01/2010 01:49:57 PM Electronically Signed by Charlett Lango M.D. on 07/01/2010 02:09:43 PM Electronically Signed by Charlett Lango M.D. on 07/01/2010 02:30:27 PM Electronically Signed by Charlett Lango M.D. on 07/01/2010 02:53:37 PM Electronically  Signed by Charlett Lango M.D. on 07/01/2010 03:18:04 PM Electronically Signed by Charlett Lango M.D. on 07/01/2010 03:42:47 PM Electronically Signed by Charlett Lango M.D. on 07/01/2010 04:19:40 PM Electronically Signed by Charlett Lango M.D. on 07/01/2010 04:50:45 PM Electronically Signed by Charlett Lango M.D. on 07/01/2010 04:50:45 PM Electronically Signed by Charlett Lango M.D. on 07/01/2010 07:36:58 PM

## 2010-07-01 NOTE — Op Note (Addendum)
NAME:  Chris Payne, Chris Payne               ACCOUNT NO.:  [id number]  MEDICAL RECORD NO.:  [id number]           PATIENT TYPE:  I  LOCATION:  2306                         FACILITY:  [facility]  PHYSICIAN:  Salvatore Decent. Dorris Fetch, M.D.DATE OF BIRTH:  1950/12/28  DATE OF PROCEDURE:  06/24/2010 DATE OF DISCHARGE:  06/19/2010                              OPERATIVE REPORT   PREOPERATIVE DIAGNOSIS:  Left main and three-vessel disease with unstable angina.  POSTOPERATIVE DIAGNOSIS:  Left main and three-vessel disease with unstable angina.  PROCEDURES:  Median sternotomy, extracorporeal circulation, coronary artery bypass grafting x4 (left internal mammary artery to LAD, saphenous vein graft to first diagonal, saphenous vein graft to ramus intermedius, saphenous vein graft to acute marginal), endoscopic vein harvest, right leg.  SURGEON:  Salvatore Decent. Dorris Fetch, MD  FIRST ASSISTANT:  Stephanie Acre Dasovich, PA  ANESTHESIA:  General.  FINDINGS:  Circumflex and posterolateral branches of the right coronary too small to graft, LAD poor quality target with spiral plaque and coronary endarterectomy, diagonal acute marginal fair-quality ramus intermedius, good-quality intramyocardial.  Transesophageal echocardiography revealed good left ventricular function with no significant valvular pathology.  There was mild mitral regurgitation pre and post bypass.  CLINICAL NOTE:  Chris Payne is a 60 year old gentleman with a known history of coronary artery disease who presented with unstable angina. On cardiac catheterization, he had progression of known coronary disease with now an 80% left main stenosis.  The patient was referred for coronary artery bypass grafting.  He was seen in consultation by Dr. Myra Gianotti regarding possible carotid disease, but that was not felt sufficiently severe to warrant surgical intervention.  The patient continued to have episodic chest pain even while at rest on  heparin, nitroglycerin, and was advised to undergo coronary artery bypass grafting on June 24, 2010.  The indications, risks, benefits, and alternatives were discussed in detail with the patient.  He understood that he was high-risk given his previous history of stroke and COPD secondary to heavy tobacco abuse.  He accepted risks and agreed to proceed.  OPERATIVE NOTE:  Chris Payne was brought to the preop holding area on June 24, 2010.  There Anesthesia placed a Swan-Ganz catheter and arterial blood pressure monitoring line and established adequate intravenous access.  The patient was taken to the operating room, anesthetized, and intubated.  Intravenous antibiotics were administered. A Foley catheter was placed.  The chest, abdomen, and legs were prepped and draped in usual sterile fashion.  Transesophageal echocardiography was performed that revealed relatively well-preserved left ventricular systolic function.  There was mild mitral regurgitation.  The aorta measured 3.7 cm in the mid ascending aorta approximately 4 cm of the sinuses of Valsalva.  Sinotubular anatomy was preserved.  The chest, abdomen, and legs were prepped and draped in usual sterile fashion.  Incision made in the medial aspect of the right leg at the level of the knee.  The greater saphenous vein was harvested endoscopically from the right leg from groin to mid calf.  Heparin 2000 units was administered during the vessel harvest.  Simultaneously a median sternotomy was performed.  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  The left internal mammary artery was harvested using standard technique.  It was a good- quality vessel.  After harvesting the conduits, remaining of the full heparin dose was given.  Anticoagulation was monitored with ACT measurement and heparin dosed accordingly.  The pericardium was opened. There was a small bloody pericardial effusion.   The ascending aorta was inspected and was slightly less than 4 cm in diameter, therefore there was no indication to replace the ascending aorta.  The ascending aorta was cannulated via concentric 2-0 Ethibond pledgeted pursestring sutures.  A dual-stage venous cannula was placed via pursestring suture in the rectal appendage.  Cardiopulmonary bypass was instituted and the patient was cooled to 32 degrees Celsius.  The coronary arteries were inspected and anastomotic sites were chosen.  Inspection of the heart revealed the LAD to have diffuse plaque within it.  The ramus intermedius was bifurcated vessel, which was intramyocardial.  There was a collateral branch of unclear origin providing an additional supply to the lateral wall.  The circumflex marginal was far too small to be a graftable vessel and the circumflex itself was not accessible.  There was no true posterior descending branch in the right coronary artery. There was a posterior lateral branch, it was too small to graft.  The acute marginal was a graftable vessel.  The conduits were inspected and cut to length.  A foam pad was placed in the pericardium to insulate the arm and protect the left phrenic nerve.  A temperature probe was placed in myocardial septum and a cardioplegic cannula was placed in the ascending aorta.  The aorta was crossclamped.  The left ventricle was emptied via the aortic root vent.  Cardiac arrest was then achieved with a combination of cold antegrade blood cardioplegia and topical iced saline.  1.5 L of cardioplegia was administered.  The myocardial septal temperature fell to 10 degrees Celsius.  The following distal anastomoses were performed.  First, a reverse saphenous vein graft was placed end-to-side to the acute marginal branch of the right coronary artery.  This accepted a 1.5- mm probe, it was a fair-quality target.  The vein graft was anastomosed end-to-side with a running 7-0 Prolene suture.   There was excellent flow through the graft.  Cardioplegia was administered.  There was good flow and good hemostasis.  Next, reverse saphenous vein graft was placed end-to-side to the ramus intermedius.  This was a 1.5-mm good-quality target, it was intramyocardial however.  Vein graft was anastomosed end-to-side with running 7-0 Prolene suture.  Again with cardioplegia administration there was good flow and good hemostasis.  Next, a reverse saphenous vein graft was placed end-to-side to the first diagonal branch of the LAD.  This was a fairly diffusely diseased vessel, it was fair-quality and accepted a 1-mm probe.  Vein graft was anastomosed end-to-side with a running 7-0 Prolene suture.  Probe passed easily proximally and distally at completion of the anastomosis.  There was good flow and good hemostasis with cardioplegia administration.  While dissecting out the LAD, the anterior wall was entered.  There was a spiral plaque in this area with extension of the arteriotomy proximally and distally.  It was clear that his spiral plaque was going to preclude a clean anastomosis, therefore an endarterectomy was performed.  A 1-mm probe passed easily distally into the endarterectomized LAD and a 1.5 mm probe passed easily proximally.  The distal end of the left internal mammary artery then was beveled  and a relatively long end-to-side anastomosis was performed with a running 8-0 Prolene suture.  At completion of the anastomosis, the bulldog clamp was briefly removed to inspect for hemostasis.  Immediate rapid septal rewarming was noted.  The bulldog clamp was replaced.  The mammary pedicle was tacked to the epicardial surface of the heart with 6-0 Prolene sutures.  Additional cardioplegia was administered.  The vein grafts were cut to length.  The proximal vein graft anastomoses then were performed with 4.0 mm punch aortotomies with running 6-0 Prolene sutures.  At completion of all  proximal anastomosis, the patient was placed in Trendelenburg position.  Lidocaine was administered.  Bulldog clamp was again removed from the left mammary artery.  The aortic root was de- aired and the aortic crossclamp was removed.  Total crossclamp time was 101 minutes.  While the patient was being rewarmed, all proximal and distal anastomoses were inspected for hemostasis.  Epicardial pacing wires were placed on the right ventricle and right atrium.  When the patient had rewarmed to a core temperature of 37 degrees Celsius, he was weaned from cardiopulmonary bypass on the first attempt.  Total bypass time was 144 minutes.  He was DDD paced at the time of separation from bypass, but did not require inotropic support.  Postbypass transesophageal echocardiography revealed no change in left ventricular wall function from the prebypass study.  The initial cardiac index was greater than 2 L per meter squared and the exception of decrease in blood pressure with protamine administration, the patient remained hemodynamically stable throughout postbypass period.  Test dose protamine was administered.  There was transient decrease in blood pressure.  Remainder of the protamine was administered slowly without incident.  Chest was irrigated with warm saline.  Hemostasis was achieved.  The pericardium was reapproximated over the aorta and heart with interrupted 3-0 silk sutures.  It came together easily without tension or kinking underlying grafts.  Left pleural and single mediastinal chest tubes were placed as a separate subcostal incisions. The sternum was closed with interrupted heavy-gauge stainless steel wires.  The pectoralis fascia, subcutaneous tissue, and skin were closed in a standard fashion.  All sponge, needle, and instrument counts were correct at the end of the procedure.  The patient was taken from the operating room to the surgical intensive care unit in a fair  condition.     Salvatore Decent Dorris Fetch, M.D.     SCH/MEDQ  D:  06/24/2010  T:  06/25/2010  Job:  [id number]  cc:   Nanetta Batty, M.D. Marcos Eke. Hal Hope, M.D. Jorge Ny, MD  Electronically Signed by Charlett Lango M.D. on 07/01/2010 12:32:11 PM Electronically Signed by Charlett Lango M.D. on 07/01/2010 12:49:26 PM Electronically Signed by Charlett Lango M.D. on 07/01/2010 01:07:26 PM Electronically Signed by Charlett Lango M.D. on 07/01/2010 01:28:17 PM Electronically Signed by Charlett Lango M.D. on 07/01/2010 01:49:26 PM Electronically Signed by Charlett Lango M.D. on 07/01/2010 02:09:12 PM Electronically Signed by Charlett Lango M.D. on 07/01/2010 02:29:50 PM Electronically Signed by Charlett Lango M.D. on 07/01/2010 02:52:56 PM Electronically Signed by Charlett Lango M.D. on 07/01/2010 03:17:18 PM Electronically Signed by Charlett Lango M.D. on 07/01/2010 03:42:03 PM Electronically Signed by Charlett Lango M.D. on 07/01/2010 04:18:44 PM Electronically Signed by Charlett Lango M.D. on 07/01/2010 04:49:55 PM Electronically Signed by Charlett Lango M.D. on 07/01/2010 07:36:58 PM

## 2010-07-01 NOTE — Discharge Summary (Addendum)
NAME:  Chris Payne, Chris Payne               ACCOUNT NO.:  [id number]  MEDICAL RECORD NO.:  [id number]           PATIENT TYPE:  LOCATION:                                 FACILITY:  PHYSICIAN:  Salvatore Decent. Dorris Fetch, M.D.DATE OF BIRTH:  May 22, 1950  DATE OF ADMISSION:  06/19/2010 DATE OF DISCHARGE:                              DISCHARGE SUMMARY   ADMITTING DIAGNOSES: 1. History of coronary artery disease. 2. History of abdominal aortic aneurysm (status post abdominal aortic     aneurysm repair, October 03, 2009). 3. History of hypertension. 4. History of hyperlipidemia. 5. History of pontine cerebrovascular accident. 6. History of chronic obstructive pulmonary disease. 7. History of tobacco abuse.  DISCHARGE DIAGNOSES: 1. History of coronary artery disease. 2. History of abdominal aortic aneurysm (status post abdominal aortic     aneurysm repair, October 03, 2009). 3. History of hypertension. 4. History of hyperlipidemia. 5. History of pontine cerebrovascular accident. 6. History of chronic obstructive pulmonary disease. 7. History of tobacco abuse. 8. Volume overload. 9. Thrombocytopenia. 10.Mild acute blood loss anemia. 11.Left carotid artery disease (approximately 60%).  PROCEDURES: 1. Cardiac catheterization performed by Dr. Royann Shivers on June 21, 2010. 2. Two-D echocardiogram done, February 26. 3. CABG x4 (LIMA to LAD, SVG to diagonal-1, SVG to ramus intermedius,     SVG to acute marginal with EVH of the right saphenous vein by Dr.     Dorris Fetch on June 24, 2010. 4. Intraoperative TEE done by Dr. Sampson Goon on June 24, 2010.  HISTORY OF PRESENT ILLNESS:  This is a 60 year old Caucasian male with a history of known coronary artery disease (approximately 40% left main, chronic totally occluded right coronary with an EF of 50% as of this past summer) as well as the aforementioned past medical history.  The patient as previously stated underwent aortobifemoral bypass  grafting for symptomatic abdominal aortic aneurysm.  He then had postoperative bleeding and had to undergo an exploratory laparotomy with ligation of the lumbar arteries.  These surgeries were done in June 2011.  Following this, he did have a pontine CVA and has recovered with rehab, although he does have some slight left-sided weakness.  The patient had been doing fairly well until the Friday prior to this admission when he began experiencing chest pain while at rest.  He was seen and evaluated by Dr. Allyson Sabal on June 18, 2010.  He was admitted to [facility] on June 19, 2010, to undergo cardiac catheterization which was done by Dr. Royann Shivers.  The patient was found to have an 80% ostial left main stenosis, ostial 80% lesion in the ramus, circumflex with a 90% ostial lesion, and a 60% mid LAD lesion.  A cardiothoracic consultation was obtained with Dr. Dorris Fetch for consideration of coronary artery bypass grafting surgery.  Potential risks, complications, and benefits of the surgery were discussed with the patient.  He agreed to proceed.      Prior to undergoing the surgery, however, preoperative carotid duplex carotid ultrasound was done which showed the right internal carotid to be within normal limits, however, greater than 80% left internal carotid artery stenosis.  A CTA of the neck was obtained and according to Dr. Imogene Burn, there was no evidence of greater than 80% stenosis rather more likely around a 60% stenosis.  Dr. Imogene Burn did discuss this with Dr. Delight Hoh as the patient was previously known to Dr. Myra Gianotti and it was ultimately decided that the patient will be monitored as an outpatient for his carotid artery disease and did not require surgical intervention at this time.  The patient then underwent a CABG x4 on June 24, 2010, by Dr. Dorris Fetch.  BRIEF HOSPITAL COURSE STAY:  The patient was extubated early evening the day of surgery without difficulty.  He initially did  require AI pacing. His Swan-Ganz, A-line, chest tubes, and Foley were all removed early in his postoperative course.  He was started on a low-dose beta-blocker and this was then changed to Toprol.  He was found to be volume overloaded and diuresed accordingly.  He was also found to have mild acute blood loss anemia.  His H and H went as low as 10.7 and 32.6, did not require postop transfusion.  His last H and H were 11.4 and 35 respectively.  He was felt surgically stable for transfer from the Intensive Care Unit to [facility] for further convalescence on June 26, 2010.  He continued to progress with cardiac rehab.  Currently on postop day #3, he is afebrile, his heart rate is in the 70s, BP 97/72, O2 sat 95% on 2 liters nasal cannula.  He has been tolerating a diet and is passing flatus.  He has not had a bowel movement yet.  PHYSICAL EXAMINATION:  CARDIOVASCULAR:  Regular rate and rhythm. PULMONARY:  Slight decrease at the base. ABDOMEN:  Soft and nontender.  Bowel sounds present. EXTREMITIES:  Mild lower extremity edema. SKIN:  Sternal and right lower extremity wounds are clean, dry, and continuing to heal.  Provided the patient remains afebrile, hemodynamically stable, is weaned off O2, and pending morning round evaluation, will be surgically stable for discharge on June 29, 2010.  Please note that epicardial pacing wires and chest tube sutures will be removed prior to his discharge.  LATEST LABORATORY STUDIES:  BMET done on March 2, potassium 4.7, sodium 132, BUN and creatinine 14 and 0.83 respectively.  CBC on this date, H and H 1.4 and 35 respectively, white blood cell count 10,100, and platelet count 164,000.  Last chest x-ray done on June 27, 2010, shows moderate bibasilar atelectasis with small bilateral pleural effusions, no pneumothorax.  DISCHARGE INSTRUCTIONS:  Activity:  The patient may walk up steps.  He may shower.  He is not to lift more than 10 pounds for 4 weeks.  He  is not to drive until 4 weeks.  He is to continue with breathing exercise daily.  He is to walk daily and increase frequency and duration as tolerates.  Diet:  The patient to remain on a low sodium, heart healthy.  Wound Care:  The patient is to use soap and water on his wounds and let his wounds open to the air.  FOLLOWUP APPOINTMENTS: 1. The patient has an appointment to see Dr. Delton Coombes on August 04, 2010,     at 2:30 p.m. 2. The patient is to contact Dr. Hazle Coca office for followup     appointment in 2 weeks. 3. The patient has an appointment to see Dr. Dorris Fetch on July 21, 2010, at 12:15 p.m. and 30 minutes prior to this office     appointment, a  chest x-ray will be obtained.  DISCHARGE MEDICATIONS:  At time of dictation include the following: 1. Fenofibrate 54 mg p.o. daily. 2. Lasix 40 mg p.o. daily x5 days. 3. Potassium chloride 20 mEq p.o. daily x5 days. 4. Robitussin DM 15 mL p.o. q.4 h p.r.n. cough. 5. Toprol-XL 25 mg p.o. daily. 6. Oxycodone 5 mg 1-2 tablets every 4-6 hours as needed for pain. 7. Enteric-coated aspirin 325 mg p.o. daily. 8. Simvastatin 40 mg p.o. at bedtime. 9. Docusate sodium 100 mg 1 tablet p.o. 2 times daily. 10.Folic acid 1 mg p.o. daily. 11.Multivitamin p.o. daily. 12.Nexium 40 mg p.o. daily p.r.n. 13.Thiamine 100 mg p.o. daily.     Doree Fudge, PA   ______________________________ Salvatore Decent Dorris Fetch, M.D.    DZ/MEDQ  D:  06/27/2010  T:  06/28/2010  Job:  [id number]  cc:   Nanetta Batty, M.D. Leslye Peer, MD  Electronically Signed by Doree Fudge PA on 06/30/2010 10:42:34 AM Electronically Signed by Charlett Lango M.D. on 07/01/2010 12:32:15 PM Electronically Signed by Charlett Lango M.D. on 07/01/2010 12:52:39 PM Electronically Signed by Charlett Lango M.D. on 07/01/2010 01:10:41 PM Electronically Signed by Charlett Lango M.D. on 07/01/2010 01:32:09 PM Electronically Signed by Charlett Lango M.D. on 07/01/2010 01:52:53 PM Electronically Signed by Charlett Lango M.D. on 07/01/2010 02:12:49 PM Electronically Signed by Charlett Lango M.D. on 07/01/2010 02:33:45 PM Electronically Signed by Charlett Lango M.D. on 07/01/2010 02:57:07 PM Electronically Signed by Charlett Lango M.D. on 07/01/2010 03:22:00 PM Electronically Signed by Charlett Lango M.D. on 07/01/2010 03:46:16 PM Electronically Signed by Charlett Lango M.D. on 07/01/2010 04:24:00 PM Electronically Signed by Charlett Lango M.D. on 07/01/2010 04:55:17 PM Electronically Signed by Charlett Lango M.D. on 07/01/2010 07:43:20 PM

## 2010-07-02 ENCOUNTER — Ambulatory Visit (INDEPENDENT_AMBULATORY_CARE_PROVIDER_SITE_OTHER): Payer: Self-pay | Admitting: Thoracic Surgery (Cardiothoracic Vascular Surgery)

## 2010-07-02 DIAGNOSIS — I2510 Atherosclerotic heart disease of native coronary artery without angina pectoris: Secondary | ICD-10-CM

## 2010-07-02 NOTE — Assessment & Plan Note (Signed)
OFFICE VISIT  HILLERY, BHALLA DOB:  [birth year]                                        July 02, 2010 CHART #:  [id number]  REASON FOR VISIT:  Sternal drainage after recent coronary bypass grafting.  HISTORY OF PRESENT ILLNESS:  The patient is a 60 year old gentleman with history of COPD who presented with unstable angina back in February he had coronary artery bypass grafting x4 done on June 24, 2010.  He did have any major issues postop and he was discharged home on June 29, 2010, 3 days ago.  Last night, he noticed some drainage for the lower part of his sternal incision, and he comes in to the have that checked. He also was complaining of heartburn.  He has not received a prescription for Nexium when he was discharged the hospital.  He also complains of a productive cough with yellowish sputum and he denies any fevers, chills, or sweats.  PHYSICAL EXAMINATION:  VITAL SIGNS:  On physical examination he is afebrile.  Stable.  Blood pressure is 120/85, pulse 88, respirations 16, and oxygen saturation 97% on room air. CHEST:  His sternum is stable.  Sternal incision is intact.  There is no drainage.  There is some mild erythema around the edges.  There is some minimal drainage from a chest tube site which had been covered with Steri-Strips that was removed.  Lungs have diminished breath sounds throughout.  There is some coarser bronchial breath sounds at the right base.  IMPRESSION: 1. Status post coronary bypass grafting.     a.     Drainage from sternal incision.  Sternal incision is intact.      The sternum is stable.  There is some very mild erythema.  It is      possible this could represent early cellulitis, but it may just be      irritation from the absorbable sutures.  He has no evidence of      sternal disruption. 2. Cough with productive sputum which is consistent with bronchitis.     Given his history of chronic obstructive pulmonary  disease.  I am     going to start him on Cipro 500 mg p.o. b.i.d. for 7 days.  I     assume he will take all of these pills even if his cough resolves,     so he begins feeling better in the meantime.  Indigestion:  I gave him a prescription for Nexium 40 mg tablets one p.o. daily, 30 tablets, no refills.  He will need to see his medical doctor and see if that is the right medication for him long-term.  I have an appointment already scheduled to see him back in about 2 weeks. We will check on his progress at that time to see if he has any further issues related to his sternal incision.  He should give [place] a call in the meantime.  Thank your very much.  Salvatore Decent Dorris Fetch, M.D. Electronically Signed  SCH/MEDQ  D:  07/02/2010  T:  07/02/2010  Job:  [id number]  cc:   Nanetta Batty, M.D. Marcos Eke. Hal Hope, M.D.

## 2010-07-09 NOTE — Consult Note (Signed)
NAME:  NENO, HOHENSEE NO.:  [id number]  MEDICAL RECORD NO.:  [id number]           PATIENT TYPE:  I  LOCATION:  2506                         FACILITY:  [facility]  PHYSICIAN:  Nanetta Batty, M.D.   DATE OF BIRTH:  Jan 05, 1951  DATE OF CONSULTATION:  06/19/2010 DATE OF DISCHARGE:  06/19/2010                                CONSULTATION   Mr. Darco is a 60 year old thin-appearing single Caucasian male with no children who I last saw in the office 2 months ago.  He was initially referred to me by Dr. Hal Hope for claudication.  His Dopplers revealed right ABI of 0.8 and left of 0.52 with what appeared to be occluded left iliac and moderate SFA disease bilaterally.  He also had a moderate- sized abdominal aortic aneurysm.  Myoview stress test showed inferior scar and echo showed normal LV function.  Ultimate catheterization on Sep 02, 2009, revealing an occluded RCA with left and right collaterals, 40% left main, high-grade circumflex and ramus branch stenosis with EF of 45%, moderate inferior hypokinesia.  I elected to treat him medically at that time given the fact that he was asymptomatic and his Myoview was nonischemic.  Peripheral angiography revealed 60% left renal artery stenosis, and infrarenal abdominal aortic aneurysm with include left iliac and patent right iliac with a dilated aneurysmal segment.  I referred him to Dr. Durene Cal for consideration of aortobifemoral bypass grafting.  He came back to the ER.  We had Dr. Myra Gianotti to come emergently to the OR where he performed abdominal aortic aneurysm resection grafting with bypass right renal artery and inferior mesenteric artery.  He was explored with ligation of his lumbar arteries because of bleeding.  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  He does continue to smoke 4 cigarettes a day.  He also has mild left internal  carotid stenosis.  Over the last week, he has noticed daily chest pain.  He actually presented to the ER on June 13, 2010, he was evaluated by Dr. Donnetta Hutching.  His markers were negative and he was sent home.  MEDICATIONS:  Folic acid, thiamine, Celebrex, Lortab.  ALLERGIES:  None.  REVIEW OF SYSTEMS:  Twelve-point review of systems was negative except as already noted.  PHYSICAL EXAMINATION:  VITAL SIGNS:  Blood pressure 128/84, pulse was 88.  Weight is up to 169 pounds. GENERAL:  The patient is alert and oriented. NEUROLOGIC:  Intact. LUNGS:  Clear.  Carotids are 2+ without bruits. HEART:  Regular rate and rhythm without murmurs, gallops, rubs, or clicks.  Mr. Shiffler has chest pain that is worrisome for unstable angina, especially in light of his coronary anatomy from 9 months ago with ongoing risk factors.  We are admitting him to step-down bed and we will put him on IV heparin and nitro.  He will need diagnostic coronary arteriography to define his anatomy.  He may ultimately require bypass grafting.     Nanetta Batty, M.D.     Cordelia Pen  D:  06/19/2010  T:  06/20/2010  Job:  [id number]  cc:   Clydie Braun L. Hal Hope, M.D.  Electronically Signed by Nanetta Batty M.D. on 07/09/2010 01:59:39 PM

## 2010-07-13 LAB — CBC
HCT: 36.2 % — ABNORMAL LOW (ref 39.0–52.0)
HCT: 30.2 % — ABNORMAL LOW (ref 39.0–52.0)
HCT: 31.6 % — ABNORMAL LOW (ref 39.0–52.0)
HCT: 31.7 % — ABNORMAL LOW (ref 39.0–52.0)
HCT: 31.9 % — ABNORMAL LOW (ref 39.0–52.0)
HCT: 32.6 % — ABNORMAL LOW (ref 39.0–52.0)
Hemoglobin: 12.3 g/dL — ABNORMAL LOW (ref 13.0–17.0)
Hemoglobin: 10.7 g/dL — ABNORMAL LOW (ref 13.0–17.0)
Hemoglobin: 10.9 g/dL — ABNORMAL LOW (ref 13.0–17.0)
Hemoglobin: 11.1 g/dL — ABNORMAL LOW (ref 13.0–17.0)
Hemoglobin: 11.1 g/dL — ABNORMAL LOW (ref 13.0–17.0)
MCH: 32.8 pg (ref 26.0–34.0)
MCH: 33.1 pg (ref 26.0–34.0)
MCHC: 33.7 g/dL (ref 30.0–36.0)
MCHC: 34 g/dL (ref 30.0–36.0)
MCHC: 34.1 g/dL (ref 30.0–36.0)
MCHC: 34.3 g/dL (ref 30.0–36.0)
MCV: 97.2 fL (ref 78.0–100.0)
MCV: 97.2 fL (ref 78.0–100.0)
MCV: 97.5 fL (ref 78.0–100.0)
MCV: 97.7 fL (ref 78.0–100.0)
MCV: 98.4 fL (ref 78.0–100.0)
Platelets: 281 10*3/uL (ref 150–400)
Platelets: 332 10*3/uL (ref 150–400)
Platelets: 508 10*3/uL — ABNORMAL HIGH (ref 150–400)
Platelets: 531 10*3/uL — ABNORMAL HIGH (ref 150–400)
Platelets: 577 10*3/uL — ABNORMAL HIGH (ref 150–400)
RBC: 3.73 MIL/uL — ABNORMAL LOW (ref 4.22–5.81)
RBC: 3.76 MIL/uL — ABNORMAL LOW (ref 4.22–5.81)
RBC: 3 MIL/uL — ABNORMAL LOW (ref 4.22–5.81)
RBC: 3.28 MIL/uL — ABNORMAL LOW (ref 4.22–5.81)
RDW: 15.6 % — ABNORMAL HIGH (ref 11.5–15.5)
RDW: 15.8 % — ABNORMAL HIGH (ref 11.5–15.5)
RDW: 16.4 % — ABNORMAL HIGH (ref 11.5–15.5)
RDW: 16.4 % — ABNORMAL HIGH (ref 11.5–15.5)
RDW: 16.5 % — ABNORMAL HIGH (ref 11.5–15.5)
RDW: 16.6 % — ABNORMAL HIGH (ref 11.5–15.5)
WBC: 7.7 10*3/uL (ref 4.0–10.5)
WBC: 10.1 10*3/uL (ref 4.0–10.5)
WBC: 10.7 10*3/uL — ABNORMAL HIGH (ref 4.0–10.5)
WBC: 12.3 10*3/uL — ABNORMAL HIGH (ref 4.0–10.5)

## 2010-07-13 LAB — GLUCOSE, CAPILLARY
Glucose-Capillary: 101 mg/dL — ABNORMAL HIGH (ref 70–99)
Glucose-Capillary: 102 mg/dL — ABNORMAL HIGH (ref 70–99)
Glucose-Capillary: 105 mg/dL — ABNORMAL HIGH (ref 70–99)
Glucose-Capillary: 106 mg/dL — ABNORMAL HIGH (ref 70–99)
Glucose-Capillary: 108 mg/dL — ABNORMAL HIGH (ref 70–99)
Glucose-Capillary: 109 mg/dL — ABNORMAL HIGH (ref 70–99)
Glucose-Capillary: 127 mg/dL — ABNORMAL HIGH (ref 70–99)
Glucose-Capillary: 131 mg/dL — ABNORMAL HIGH (ref 70–99)
Glucose-Capillary: 133 mg/dL — ABNORMAL HIGH (ref 70–99)
Glucose-Capillary: 136 mg/dL — ABNORMAL HIGH (ref 70–99)
Glucose-Capillary: 137 mg/dL — ABNORMAL HIGH (ref 70–99)
Glucose-Capillary: 143 mg/dL — ABNORMAL HIGH (ref 70–99)
Glucose-Capillary: 143 mg/dL — ABNORMAL HIGH (ref 70–99)
Glucose-Capillary: 150 mg/dL — ABNORMAL HIGH (ref 70–99)
Glucose-Capillary: 150 mg/dL — ABNORMAL HIGH (ref 70–99)
Glucose-Capillary: 151 mg/dL — ABNORMAL HIGH (ref 70–99)
Glucose-Capillary: 152 mg/dL — ABNORMAL HIGH (ref 70–99)
Glucose-Capillary: 153 mg/dL — ABNORMAL HIGH (ref 70–99)
Glucose-Capillary: 154 mg/dL — ABNORMAL HIGH (ref 70–99)
Glucose-Capillary: 155 mg/dL — ABNORMAL HIGH (ref 70–99)
Glucose-Capillary: 160 mg/dL — ABNORMAL HIGH (ref 70–99)
Glucose-Capillary: 170 mg/dL — ABNORMAL HIGH (ref 70–99)
Glucose-Capillary: 172 mg/dL — ABNORMAL HIGH (ref 70–99)
Glucose-Capillary: 179 mg/dL — ABNORMAL HIGH (ref 70–99)
Glucose-Capillary: 193 mg/dL — ABNORMAL HIGH (ref 70–99)
Glucose-Capillary: 203 mg/dL — ABNORMAL HIGH (ref 70–99)
Glucose-Capillary: 216 mg/dL — ABNORMAL HIGH (ref 70–99)
Glucose-Capillary: 229 mg/dL — ABNORMAL HIGH (ref 70–99)
Glucose-Capillary: 232 mg/dL — ABNORMAL HIGH (ref 70–99)
Glucose-Capillary: 247 mg/dL — ABNORMAL HIGH (ref 70–99)
Glucose-Capillary: 253 mg/dL — ABNORMAL HIGH (ref 70–99)
Glucose-Capillary: 283 mg/dL — ABNORMAL HIGH (ref 70–99)
Glucose-Capillary: 299 mg/dL — ABNORMAL HIGH (ref 70–99)
Glucose-Capillary: 305 mg/dL — ABNORMAL HIGH (ref 70–99)
Glucose-Capillary: 80 mg/dL (ref 70–99)
Glucose-Capillary: 85 mg/dL (ref 70–99)
Glucose-Capillary: 93 mg/dL (ref 70–99)

## 2010-07-13 LAB — BASIC METABOLIC PANEL
BUN: 19 mg/dL (ref 6–23)
BUN: 22 mg/dL (ref 6–23)
BUN: 22 mg/dL (ref 6–23)
BUN: 22 mg/dL (ref 6–23)
BUN: 23 mg/dL (ref 6–23)
BUN: 24 mg/dL — ABNORMAL HIGH (ref 6–23)
CO2: 26 mEq/L (ref 19–32)
CO2: 28 mEq/L (ref 19–32)
CO2: 29 mEq/L (ref 19–32)
Calcium: 8.6 mg/dL (ref 8.4–10.5)
Calcium: 8 mg/dL — ABNORMAL LOW (ref 8.4–10.5)
Calcium: 8.1 mg/dL — ABNORMAL LOW (ref 8.4–10.5)
Chloride: 100 mEq/L (ref 96–112)
Chloride: 100 mEq/L (ref 96–112)
Chloride: 100 mEq/L (ref 96–112)
Chloride: 105 mEq/L (ref 96–112)
Chloride: 108 mEq/L (ref 96–112)
Chloride: 113 mEq/L — ABNORMAL HIGH (ref 96–112)
Creatinine, Ser: 0.49 mg/dL (ref 0.4–1.5)
Creatinine, Ser: 0.55 mg/dL (ref 0.4–1.5)
Creatinine, Ser: 0.56 mg/dL (ref 0.4–1.5)
Creatinine, Ser: 0.58 mg/dL (ref 0.4–1.5)
GFR calc Af Amer: >60 mL/min (ref >60)
GFR calc Af Amer: >60 mL/min (ref >60)
GFR calc Af Amer: >60 mL/min (ref >60)
GFR calc Af Amer: >60 mL/min (ref >60)
GFR calc Af Amer: >60 mL/min (ref >60)
GFR calc non Af Amer: >60 mL/min (ref >60)
GFR calc non Af Amer: >60 mL/min (ref >60)
GFR calc non Af Amer: >60 mL/min (ref >60)
GFR calc non Af Amer: >60 mL/min (ref >60)
GFR calc non Af Amer: >60 mL/min (ref >60)
Glucose, Bld: 132 mg/dL — ABNORMAL HIGH (ref 70–99)
Glucose, Bld: 158 mg/dL — ABNORMAL HIGH (ref 70–99)
Glucose, Bld: 176 mg/dL — ABNORMAL HIGH (ref 70–99)
Glucose, Bld: 181 mg/dL — ABNORMAL HIGH (ref 70–99)
Glucose, Bld: 191 mg/dL — ABNORMAL HIGH (ref 70–99)
Potassium: 4.2 mEq/L (ref 3.5–5.1)
Potassium: 3.8 mEq/L (ref 3.5–5.1)
Potassium: 3.8 mEq/L (ref 3.5–5.1)
Potassium: 4.1 mEq/L (ref 3.5–5.1)
Potassium: 4.4 mEq/L (ref 3.5–5.1)
Sodium: 137 mEq/L (ref 135–145)
Sodium: 136 mEq/L (ref 135–145)
Sodium: 138 mEq/L (ref 135–145)
Sodium: 143 mEq/L (ref 135–145)
Sodium: 144 mEq/L (ref 135–145)

## 2010-07-13 LAB — VITAMIN B12: Vitamin B-12: 1006 pg/mL — ABNORMAL HIGH (ref 211–911)

## 2010-07-13 LAB — CLOSTRIDIUM DIFFICILE EIA
C difficile Toxins A+B, EIA: NEGATIVE
C difficile Toxins A+B, EIA: NEGATIVE

## 2010-07-13 LAB — PROTIME-INR
INR: 1.22 (ref 0.00–1.49)
Prothrombin Time: 15.3 seconds — ABNORMAL HIGH (ref 11.6–15.2)

## 2010-07-13 LAB — LIPID PANEL
Cholesterol: 112 mg/dL (ref 0–200)
LDL Cholesterol: 64 mg/dL (ref 0–99)
Total CHOL/HDL Ratio: 4.7 RATIO
Triglycerides: 118 mg/dL (ref <150)
VLDL: 24 mg/dL (ref 0–40)

## 2010-07-13 LAB — BRAIN NATRIURETIC PEPTIDE: Pro B Natriuretic peptide (BNP): 80 pg/mL (ref 0.0–100.0)

## 2010-07-13 LAB — CK: Total CK: 59 U/L (ref 7–232)

## 2010-07-13 LAB — TSH: TSH: 0.521 u[IU]/mL (ref 0.350–4.500)

## 2010-07-13 LAB — MRSA PCR SCREENING: MRSA by PCR: NEGATIVE

## 2010-07-13 LAB — VITAMIN D 25 HYDROXY (VIT D DEFICIENCY, FRACTURES): Vit D, 25-Hydroxy: 33 ng/mL (ref 30–89)

## 2010-07-14 LAB — PREPARE FRESH FROZEN PLASMA

## 2010-07-14 LAB — POCT I-STAT 3, ART BLOOD GAS (G3+)
Acid-base deficit: 1 mmol/L (ref 0.0–2.0)
Acid-base deficit: 1 mmol/L (ref 0.0–2.0)
Bicarbonate: 22.8 mEq/L (ref 20.0–24.0)
Bicarbonate: 23.2 mEq/L (ref 20.0–24.0)
Bicarbonate: 23.3 mEq/L (ref 20.0–24.0)
Bicarbonate: 23.5 mEq/L (ref 20.0–24.0)
Bicarbonate: 23.8 mEq/L (ref 20.0–24.0)
Bicarbonate: 25.7 mEq/L — ABNORMAL HIGH (ref 20.0–24.0)
O2 Saturation: 100 %
O2 Saturation: 89 %
O2 Saturation: 91 %
Patient temperature: 36.8
Patient temperature: 37.7
Patient temperature: 37.7
Patient temperature: 97.8
TCO2: 24 mmol/L (ref 0–100)
TCO2: 24 mmol/L (ref 0–100)
TCO2: 24 mmol/L (ref 0–100)
TCO2: 25 mmol/L (ref 0–100)
TCO2: 27 mmol/L (ref 0–100)
pCO2 arterial: 35 mmHg (ref 35.0–45.0)
pCO2 arterial: 35.8 mmHg (ref 35.0–45.0)
pCO2 arterial: 36 mmHg (ref 35.0–45.0)
pCO2 arterial: 39.1 mmHg (ref 35.0–45.0)
pCO2 arterial: 49.8 mmHg — ABNORMAL HIGH (ref 35.0–45.0)
pH, Arterial: 7.294 — ABNORMAL LOW (ref 7.350–7.450)
pH, Arterial: 7.373 (ref 7.350–7.450)
pH, Arterial: 7.382 (ref 7.350–7.450)
pH, Arterial: 7.386 (ref 7.350–7.450)
pH, Arterial: 7.389 (ref 7.350–7.450)
pH, Arterial: 7.397 (ref 7.350–7.450)
pH, Arterial: 7.477 — ABNORMAL HIGH (ref 7.350–7.450)
pO2, Arterial: 117 mmHg — ABNORMAL HIGH (ref 80.0–100.0)
pO2, Arterial: 265 mmHg — ABNORMAL HIGH (ref 80.0–100.0)
pO2, Arterial: 59 mmHg — ABNORMAL LOW (ref 80.0–100.0)
pO2, Arterial: 61 mmHg — ABNORMAL LOW (ref 80.0–100.0)
pO2, Arterial: 63 mmHg — ABNORMAL LOW (ref 80.0–100.0)
pO2, Arterial: 64 mmHg — ABNORMAL LOW (ref 80.0–100.0)

## 2010-07-14 LAB — TYPE AND SCREEN

## 2010-07-14 LAB — CBC
HCT: 20.1 % — ABNORMAL LOW (ref 39.0–52.0)
HCT: 25.8 % — ABNORMAL LOW (ref 39.0–52.0)
HCT: 28.6 % — ABNORMAL LOW (ref 39.0–52.0)
HCT: 29.1 % — ABNORMAL LOW (ref 39.0–52.0)
HCT: 32.9 % — ABNORMAL LOW (ref 39.0–52.0)
HCT: 32.9 % — ABNORMAL LOW (ref 39.0–52.0)
HCT: 32.9 % — ABNORMAL LOW (ref 39.0–52.0)
HCT: 33.6 % — ABNORMAL LOW (ref 39.0–52.0)
HCT: 42.9 % (ref 39.0–52.0)
Hemoglobin: 11.1 g/dL — ABNORMAL LOW (ref 13.0–17.0)
Hemoglobin: 11.2 g/dL — ABNORMAL LOW (ref 13.0–17.0)
Hemoglobin: 11.8 g/dL — ABNORMAL LOW (ref 13.0–17.0)
Hemoglobin: 12 g/dL — ABNORMAL LOW (ref 13.0–17.0)
Hemoglobin: 8.2 g/dL — ABNORMAL LOW (ref 13.0–17.0)
Hemoglobin: 8.8 g/dL — ABNORMAL LOW (ref 13.0–17.0)
MCHC: 34 g/dL (ref 30.0–36.0)
MCHC: 34.6 g/dL (ref 30.0–36.0)
MCHC: 34.9 g/dL (ref 30.0–36.0)
MCHC: 35.7 g/dL (ref 30.0–36.0)
MCV: 91.9 fL (ref 78.0–100.0)
MCV: 93 fL (ref 78.0–100.0)
MCV: 93.2 fL (ref 78.0–100.0)
MCV: 94.7 fL (ref 78.0–100.0)
MCV: 97.3 fL (ref 78.0–100.0)
Platelets: 109 10*3/uL — ABNORMAL LOW (ref 150–400)
Platelets: 235 10*3/uL (ref 150–400)
Platelets: 45 10*3/uL — ABNORMAL LOW (ref 150–400)
Platelets: 50 10*3/uL — ABNORMAL LOW (ref 150–400)
Platelets: 61 10*3/uL — ABNORMAL LOW (ref 150–400)
Platelets: 72 10*3/uL — ABNORMAL LOW (ref 150–400)
Platelets: 79 10*3/uL — ABNORMAL LOW (ref 150–400)
Platelets: 82 10*3/uL — ABNORMAL LOW (ref 150–400)
Platelets: 83 10*3/uL — ABNORMAL LOW (ref 150–400)
Platelets: 84 10*3/uL — ABNORMAL LOW (ref 150–400)
Platelets: 99 10*3/uL — ABNORMAL LOW (ref 150–400)
RBC: 2.07 MIL/uL — ABNORMAL LOW (ref 4.22–5.81)
RBC: 2.23 MIL/uL — ABNORMAL LOW (ref 4.22–5.81)
RBC: 2.58 MIL/uL — ABNORMAL LOW (ref 4.22–5.81)
RBC: 3.02 MIL/uL — ABNORMAL LOW (ref 4.22–5.81)
RBC: 3.04 MIL/uL — ABNORMAL LOW (ref 4.22–5.81)
RBC: 3.54 MIL/uL — ABNORMAL LOW (ref 4.22–5.81)
RDW: 13.1 % (ref 11.5–15.5)
RDW: 13.6 % (ref 11.5–15.5)
RDW: 15.7 % — ABNORMAL HIGH (ref 11.5–15.5)
RDW: 16 % — ABNORMAL HIGH (ref 11.5–15.5)
RDW: 16 % — ABNORMAL HIGH (ref 11.5–15.5)
RDW: 16 % — ABNORMAL HIGH (ref 11.5–15.5)
RDW: 16.1 % — ABNORMAL HIGH (ref 11.5–15.5)
RDW: 16.1 % — ABNORMAL HIGH (ref 11.5–15.5)
WBC: 10.3 10*3/uL (ref 4.0–10.5)
WBC: 10.5 10*3/uL (ref 4.0–10.5)
WBC: 10.6 10*3/uL — ABNORMAL HIGH (ref 4.0–10.5)
WBC: 12.2 10*3/uL — ABNORMAL HIGH (ref 4.0–10.5)
WBC: 6.5 10*3/uL (ref 4.0–10.5)
WBC: 8.4 10*3/uL (ref 4.0–10.5)

## 2010-07-14 LAB — GLUCOSE, CAPILLARY
Glucose-Capillary: 101 mg/dL — ABNORMAL HIGH (ref 70–99)
Glucose-Capillary: 111 mg/dL — ABNORMAL HIGH (ref 70–99)
Glucose-Capillary: 113 mg/dL — ABNORMAL HIGH (ref 70–99)
Glucose-Capillary: 116 mg/dL — ABNORMAL HIGH (ref 70–99)
Glucose-Capillary: 117 mg/dL — ABNORMAL HIGH (ref 70–99)
Glucose-Capillary: 118 mg/dL — ABNORMAL HIGH (ref 70–99)
Glucose-Capillary: 123 mg/dL — ABNORMAL HIGH (ref 70–99)
Glucose-Capillary: 123 mg/dL — ABNORMAL HIGH (ref 70–99)
Glucose-Capillary: 123 mg/dL — ABNORMAL HIGH (ref 70–99)
Glucose-Capillary: 125 mg/dL — ABNORMAL HIGH (ref 70–99)
Glucose-Capillary: 126 mg/dL — ABNORMAL HIGH (ref 70–99)
Glucose-Capillary: 127 mg/dL — ABNORMAL HIGH (ref 70–99)
Glucose-Capillary: 127 mg/dL — ABNORMAL HIGH (ref 70–99)
Glucose-Capillary: 135 mg/dL — ABNORMAL HIGH (ref 70–99)
Glucose-Capillary: 152 mg/dL — ABNORMAL HIGH (ref 70–99)
Glucose-Capillary: 280 mg/dL — ABNORMAL HIGH (ref 70–99)
Glucose-Capillary: 326 mg/dL — ABNORMAL HIGH (ref 70–99)
Glucose-Capillary: 98 mg/dL (ref 70–99)
Glucose-Capillary: 99 mg/dL (ref 70–99)

## 2010-07-14 LAB — POCT I-STAT 7, (LYTES, BLD GAS, ICA,H+H)
Bicarbonate: 23.5 mEq/L (ref 20.0–24.0)
Calcium, Ion: 0.93 mmol/L — ABNORMAL LOW (ref 1.12–1.32)
Calcium, Ion: 1 mmol/L — ABNORMAL LOW (ref 1.12–1.32)
Calcium, Ion: 1.22 mmol/L (ref 1.12–1.32)
HCT: 20 % — ABNORMAL LOW (ref 39.0–52.0)
HCT: 28 % — ABNORMAL LOW (ref 39.0–52.0)
HCT: 31 % — ABNORMAL LOW (ref 39.0–52.0)
HCT: 41 % (ref 39.0–52.0)
Hemoglobin: 10.5 g/dL — ABNORMAL LOW (ref 13.0–17.0)
Hemoglobin: 13.9 g/dL (ref 13.0–17.0)
Hemoglobin: 6.8 g/dL — CL (ref 13.0–17.0)
Hemoglobin: 9.5 g/dL — ABNORMAL LOW (ref 13.0–17.0)
O2 Saturation: 100 %
O2 Saturation: 100 %
Potassium: 3.6 mEq/L (ref 3.5–5.1)
Potassium: 4.1 mEq/L (ref 3.5–5.1)
Potassium: 4.3 mEq/L (ref 3.5–5.1)
Sodium: 139 mEq/L (ref 135–145)
Sodium: 140 mEq/L (ref 135–145)
pCO2 arterial: 37.8 mmHg (ref 35.0–45.0)
pCO2 arterial: 49.1 mmHg — ABNORMAL HIGH (ref 35.0–45.0)
pCO2 arterial: 52.5 mmHg — ABNORMAL HIGH (ref 35.0–45.0)
pH, Arterial: 7.364 (ref 7.350–7.450)
pH, Arterial: 7.394 (ref 7.350–7.450)
pO2, Arterial: 186 mmHg — ABNORMAL HIGH (ref 80.0–100.0)
pO2, Arterial: 313 mmHg — ABNORMAL HIGH (ref 80.0–100.0)
pO2, Arterial: 36 mmHg — CL (ref 80.0–100.0)
pO2, Arterial: 366 mmHg — ABNORMAL HIGH (ref 80.0–100.0)

## 2010-07-14 LAB — AMYLASE: Amylase: 47 U/L (ref 0–105)

## 2010-07-14 LAB — POCT I-STAT, CHEM 8
BUN: 9 mg/dL (ref 6–23)
Calcium, Ion: 1.18 mmol/L (ref 1.12–1.32)
Chloride: 105 mEq/L (ref 96–112)
Chloride: 110 mEq/L (ref 96–112)
Creatinine, Ser: 0.8 mg/dL (ref 0.4–1.5)
HCT: 26 % — ABNORMAL LOW (ref 39.0–52.0)
HCT: 46 % (ref 39.0–52.0)
Hemoglobin: 15.6 g/dL (ref 13.0–17.0)
Potassium: 3.6 mEq/L (ref 3.5–5.1)
Potassium: 4.1 mEq/L (ref 3.5–5.1)
Sodium: 138 mEq/L (ref 135–145)
Sodium: 141 mEq/L (ref 135–145)

## 2010-07-14 LAB — POCT I-STAT 4, (NA,K, GLUC, HGB,HCT)
Glucose, Bld: 126 mg/dL — ABNORMAL HIGH (ref 70–99)
Hemoglobin: 8.5 g/dL — ABNORMAL LOW (ref 13.0–17.0)
Potassium: 3.8 mEq/L (ref 3.5–5.1)
Sodium: 141 mEq/L (ref 135–145)

## 2010-07-14 LAB — PROTIME-INR
INR: 0.74 (ref 0.00–1.49)
INR: 0.83 (ref 0.00–1.49)
INR: 1.29 (ref 0.00–1.49)
INR: 1.44 (ref 0.00–1.49)
INR: 1.61 — ABNORMAL HIGH (ref 0.00–1.49)
Prothrombin Time: 13.7 seconds (ref 11.6–15.2)
Prothrombin Time: 16 seconds — ABNORMAL HIGH (ref 11.6–15.2)
Prothrombin Time: 17.4 seconds — ABNORMAL HIGH (ref 11.6–15.2)

## 2010-07-14 LAB — CULTURE, RESPIRATORY W GRAM STAIN

## 2010-07-14 LAB — URINALYSIS, ROUTINE W REFLEX MICROSCOPIC
Bilirubin Urine: NEGATIVE
Glucose, UA: NEGATIVE mg/dL
Ketones, ur: NEGATIVE mg/dL
Protein, ur: NEGATIVE mg/dL

## 2010-07-14 LAB — COMPREHENSIVE METABOLIC PANEL
AST: 20 U/L (ref 0–37)
Albumin: 2.1 g/dL — ABNORMAL LOW (ref 3.5–5.2)
Albumin: 2.2 g/dL — ABNORMAL LOW (ref 3.5–5.2)
Alkaline Phosphatase: 34 U/L — ABNORMAL LOW (ref 39–117)
Alkaline Phosphatase: 54 U/L (ref 39–117)
BUN: 12 mg/dL (ref 6–23)
CO2: 26 mEq/L (ref 19–32)
Calcium: 7.5 mg/dL — ABNORMAL LOW (ref 8.4–10.5)
Chloride: 112 mEq/L (ref 96–112)
GFR calc Af Amer: >60 mL/min (ref >60)
GFR calc non Af Amer: >60 mL/min (ref >60)
Potassium: 3.5 mEq/L (ref 3.5–5.1)
Potassium: 3.6 mEq/L (ref 3.5–5.1)
Sodium: 140 mEq/L (ref 135–145)
Total Bilirubin: 1.5 mg/dL — ABNORMAL HIGH (ref 0.3–1.2)
Total Protein: 3.7 g/dL — ABNORMAL LOW (ref 6.0–8.3)

## 2010-07-14 LAB — BASIC METABOLIC PANEL
BUN: 16 mg/dL (ref 6–23)
BUN: 18 mg/dL (ref 6–23)
BUN: 20 mg/dL (ref 6–23)
Calcium: 7 mg/dL — ABNORMAL LOW (ref 8.4–10.5)
Calcium: 7.6 mg/dL — ABNORMAL LOW (ref 8.4–10.5)
Calcium: 7.8 mg/dL — ABNORMAL LOW (ref 8.4–10.5)
Creatinine, Ser: 0.75 mg/dL (ref 0.4–1.5)
GFR calc Af Amer: >60 mL/min (ref >60)
GFR calc Af Amer: >60 mL/min (ref >60)
GFR calc non Af Amer: >60 mL/min (ref >60)
GFR calc non Af Amer: >60 mL/min (ref >60)
GFR calc non Af Amer: >60 mL/min (ref >60)
GFR calc non Af Amer: >60 mL/min (ref >60)
Glucose, Bld: 100 mg/dL — ABNORMAL HIGH (ref 70–99)
Glucose, Bld: 113 mg/dL — ABNORMAL HIGH (ref 70–99)
Glucose, Bld: 140 mg/dL — ABNORMAL HIGH (ref 70–99)
Potassium: 3.6 mEq/L (ref 3.5–5.1)
Potassium: 4.1 mEq/L (ref 3.5–5.1)
Sodium: 137 mEq/L (ref 135–145)
Sodium: 138 mEq/L (ref 135–145)
Sodium: 143 mEq/L (ref 135–145)

## 2010-07-14 LAB — CARDIAC PANEL(CRET KIN+CKTOT+MB+TROPI)
CK, MB: 1.8 ng/mL (ref 0.3–4.0)
CK, MB: 2.1 ng/mL (ref 0.3–4.0)
CK, MB: 2.2 ng/mL (ref 0.3–4.0)
Relative Index: 0.3 (ref 0.0–2.5)
Relative Index: 0.4 (ref 0.0–2.5)
Troponin I: 0.03 ng/mL (ref 0.00–0.06)

## 2010-07-14 LAB — PREPARE PLATELETS

## 2010-07-14 LAB — APTT
aPTT: 30 seconds (ref 24–37)
aPTT: 30 seconds (ref 24–37)
aPTT: 30 seconds (ref 24–37)
aPTT: 32 seconds (ref 24–37)
aPTT: 32 seconds (ref 24–37)
aPTT: 34 seconds (ref 24–37)
aPTT: 36 seconds (ref 24–37)

## 2010-07-14 LAB — PREPARE RBC (CROSSMATCH)

## 2010-07-14 LAB — CK TOTAL AND CKMB (NOT AT ARMC)
CK, MB: 2.2 ng/mL (ref 0.3–4.0)
Relative Index: 0.4 (ref 0.0–2.5)
Total CK: 505 U/L — ABNORMAL HIGH (ref 7–232)

## 2010-07-14 LAB — DIFFERENTIAL
Basophils Absolute: 0 10*3/uL (ref 0.0–0.1)
Basophils Relative: 1 % (ref 0–1)
Eosinophils Absolute: 0.1 10*3/uL (ref 0.0–0.7)
Eosinophils Relative: 1 % (ref 0–5)
Lymphocytes Relative: 22 % (ref 12–46)

## 2010-07-14 LAB — URINE MICROSCOPIC-ADD ON

## 2010-07-14 LAB — HEPARIN INDUCED THROMBOCYTOPENIA PNL: Heparin Induced Plt Ab: NEGATIVE

## 2010-07-14 LAB — URINE CULTURE: Colony Count: NO GROWTH

## 2010-07-14 LAB — MAGNESIUM: Magnesium: 2.1 mg/dL (ref 1.5–2.5)

## 2010-07-14 LAB — POTASSIUM: Potassium: 3.3 mEq/L — ABNORMAL LOW (ref 3.5–5.1)

## 2010-07-18 ENCOUNTER — Other Ambulatory Visit: Payer: Self-pay | Admitting: Thoracic Surgery (Cardiothoracic Vascular Surgery)

## 2010-07-18 DIAGNOSIS — I2510 Atherosclerotic heart disease of native coronary artery without angina pectoris: Secondary | ICD-10-CM

## 2010-07-21 ENCOUNTER — Encounter (INDEPENDENT_AMBULATORY_CARE_PROVIDER_SITE_OTHER): Payer: Self-pay | Admitting: Thoracic Surgery (Cardiothoracic Vascular Surgery)

## 2010-07-21 ENCOUNTER — Ambulatory Visit
Admission: RE | Admit: 2010-07-21 | Discharge: 2010-07-21 | Disposition: A | Discharge status: Home / Self Care | Payer: Medicare Other | Source: Ambulatory Visit | Attending: Thoracic Surgery (Cardiothoracic Vascular Surgery) | Admitting: Thoracic Surgery (Cardiothoracic Vascular Surgery)

## 2010-07-21 DIAGNOSIS — I2510 Atherosclerotic heart disease of native coronary artery without angina pectoris: Secondary | ICD-10-CM

## 2010-07-22 NOTE — Assessment & Plan Note (Signed)
OFFICE VISIT  SCHUYLER, OLDEN DOB:  1950-11-30                                        July 21, 2010 CHART #:  [id number]  REASON FOR VISIT:  Followup after recent coronary bypass grafting.  The patient is a 60 year old gentleman who presented with unstable angina back in February 2012.  On catheterization, he had 80% left main stenosis.  He underwent coronary bypass grafting x4 on June 24, 2010, noted at that time that he really had diffusely diseased fair-to- poor quality targets.  He did have good left ventricular function. Postoperatively, he did fairly well, did not have any significant complications.  He was seen back in the office on July 02, 2010, for some drainage from his sternal incision.  He was treated with some antibiotics.  He also had a cough and some sputum production.  We gave Cipro at that time.  He now returns for scheduled postoperative followup visit.  He says his cough is better.  He is breathing well.  He is not having any anginal-type chest pain.  He does still have some incisional pain for which he is taking hydrocodone several times a day.  His exercise tolerance is improving.  He is having some difficulty sleeping, but now when he finally does get to sleep, he can sleep through the night and his appetite improved a little bit over the past week or so.  PHYSICAL EXAMINATION:  The patient is a 60 year old gentleman, in no acute distress.  Blood pressure 130/89, pulse 74, respirations 18, his oxygen saturation is 90% on room air.  Lungs are clear bilaterally. There is no rales or wheezing.  His cardiac exam has regular rate and rhythm.  Normal S1 and S2.  No murmurs, rubs, or gallops.  His sternal incision is clean, dry, and intact and sternum is stable.  Chest tube sites have some eschar but no signs of infection.  Leg incision is healing well.  He has no peripheral edema.  Chest x-ray shows resolution of pleural  effusions.  IMPRESSION:  The patient is a 60 year old gentleman.  He is now about a month out from coronary bypass grafting.  He is having a good result at this point in time.  His exercise tolerance is improving and in general, he is getting over the operation.  I advised him that on days when he is not hurting bad enough to take Vicodin, then he could drive a car but to limit himself to short trips in low speed, he should not be doing any highway or high-speed driving or anything over 54-09 minutes for at least another month.  He is not to lift anything over 10 pounds for another 2 weeks.  After that, he can gradually start increasing his upper arm activity.  We are going to refill his pain medication.  He questions about cardiac rehab.  I think he will be fine to start that anytime in the next week or 2.  He has an appointment with Dr. Allyson Sabal in early April 2012.  I would be happy to see the patient anytime if I can be of any further assistance with his care.  Salvatore Decent Dorris Fetch, M.D. Electronically Signed  SCH/MEDQ  D:  07/21/2010  T:  07/22/2010  Job:  [id number]  cc:   Clydie Braun L. Hal Hope, M.D. Leslye Peer, MD Nanetta Batty,  M.D. 

## 2010-07-30 ENCOUNTER — Encounter (HOSPITAL_COMMUNITY): Payer: Medicare Other

## 2010-08-01 ENCOUNTER — Encounter (HOSPITAL_COMMUNITY): Payer: Medicare Other

## 2010-08-04 ENCOUNTER — Encounter (HOSPITAL_COMMUNITY): Payer: Medicare Other

## 2010-08-04 ENCOUNTER — Inpatient Hospital Stay: Payer: Medicare Other | Admitting: Emergency Medicine

## 2010-08-06 ENCOUNTER — Encounter (HOSPITAL_COMMUNITY): Payer: Medicare Other

## 2010-08-08 ENCOUNTER — Encounter (HOSPITAL_COMMUNITY): Payer: Medicare Other

## 2010-08-11 ENCOUNTER — Encounter (HOSPITAL_COMMUNITY): Payer: Medicare Other

## 2010-08-13 ENCOUNTER — Encounter (HOSPITAL_COMMUNITY): Payer: Medicare Other

## 2010-08-15 ENCOUNTER — Encounter (HOSPITAL_COMMUNITY): Payer: Medicare Other

## 2010-08-18 ENCOUNTER — Encounter (HOSPITAL_COMMUNITY): Payer: Medicare Other

## 2010-08-20 ENCOUNTER — Encounter (HOSPITAL_COMMUNITY): Payer: Medicare Other

## 2010-08-22 ENCOUNTER — Encounter (HOSPITAL_COMMUNITY): Payer: Medicare Other

## 2010-08-25 ENCOUNTER — Encounter (HOSPITAL_COMMUNITY): Payer: Medicare Other

## 2010-08-27 ENCOUNTER — Encounter (HOSPITAL_COMMUNITY): Payer: Medicare Other

## 2010-08-29 ENCOUNTER — Encounter (HOSPITAL_COMMUNITY): Payer: Medicare Other

## 2010-09-01 ENCOUNTER — Encounter (HOSPITAL_COMMUNITY): Payer: Medicare Other

## 2010-09-03 ENCOUNTER — Encounter (HOSPITAL_COMMUNITY): Payer: Medicare Other

## 2010-09-05 ENCOUNTER — Encounter (HOSPITAL_COMMUNITY): Payer: Medicare Other

## 2010-09-08 ENCOUNTER — Encounter (HOSPITAL_COMMUNITY): Payer: Medicare Other

## 2010-09-09 NOTE — Assessment & Plan Note (Signed)
OFFICE VISIT   VERYL, WINEMILLER  DOB:  10-Jan-1951                                       01/13/2010  CHART#:[id number]   Mr. Dermody comes back today for follow-up.  He is status post abdominal  aortic aneurysm repair using a 16 x 8 Dacron graft with reimplantation  of his right accessory renal artery as well as his inferior mesenteric  artery.  This was performed on October 03, 2009.  The patient did require a  return trip to the operating room for bleeding.  At the time of his  return trip, a lumbar artery was identified which was the source.  Unfortunately, the patient suffered a pontine stroke during his hospital  stay.  He was ultimately discharged to home.  He comes back in today.  He is nearly fully recovered from his stroke.  He is able to ambulate on  his own using a walker.  He has minimal lower extremity symptoms.  His  only deficit right now according to him is some language difficulty  although his speech is audible.   On physical examination, his incisions are well-healed.  He does have  some swelling in his ankles.   I think the patient is recovering nicely from his operation as well as  his stroke.  I expect him to make a full recovery.  I have refilled his  Lortab today and given him 40 tablets.  I have also given him on Lasix  and potassium to help the swelling and am giving him a prescription for  compression stockings for swelling.  I will plan on seeing him back in 3  months.     Jorge Ny, MD  Electronically Signed   VWB/MEDQ  D:  01/13/2010  T:  01/14/2010  Job:  [id number]   cc:   Nanetta Batty, M.D.

## 2010-09-09 NOTE — Procedures (Signed)
   NAME:  Chris Payne, Chris Payne NO.:  [id number]   MEDICAL RECORD NO.:  [id number]          PATIENT TYPE:  AMB   LOCATION:  SDS                          FACILITY:  [facility]   PHYSICIAN:  Nanetta Batty, M.D.   DATE OF BIRTH:  1951/04/04   DATE OF PROCEDURE:  09/02/2009  DATE OF DISCHARGE:                    PERIPHERAL VASCULAR INVASIVE PROCEDURE   Chris Payne is a 60 year old gentleman with multiple cardiac risk  factors, claudication with abnormal Dopplers and an abdominal aortic  aneurysm by abdominal duplex.  He had inferior scar on Myoview stress  testing with catheterization just performed revealing total right with  left-to-right collaterals and disease of the circumflex ostium and ramus  branch with moderate left ventricle dysfunction.  He presents now for  abdominal aortography with bifemoral runoffs.   PROCEDURE DESCRIPTION:  The existing 5-French sheath in the right  femoral artery was used.  The pigtail catheter was pulled down to the  level of the renal arteries.  Digital subtraction bolus-chase step table  technique was used to perform abdominal aortography.   ANGIOGRAPHIC RESULTS:  1. Abdominal aortogram      a.     Renal arteries - 50% left renal artery stenosis.      b.     Infrarenal abdominal aorta - __________ abdominal aortic       aneurysm.  2. Left lower extremity      a.     Totally occluded left common iliac artery with       reconstitution after the short segment occlusion.  There was 50%       segmental stenosis of the left superficial femoral artery with two-       vessel runoff through the posterior tibial.  3. Right lower extremity      a.     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     50% segmental mid-right superficial femoral artery stenosis       with two-vessel runoff.   IMPRESSION:  Chris Payne has a moderate-size abdominal aortic aneurysm  with a totally occluded left common iliac artery and a large  right  common iliac artery aneurysm.  He will need aorta bifemoral bypass  grafting for treatment of his abdominal aortic aneurysm and  claudication.   The sheath was removed and pressure was held in order to achieve  hemostasis.  The patient left the lab in stable condition.  He will be  gently hydrated for the next 5 hours and discharged home.  We will see  him back in the office in approximately 1 week for followup.      Nanetta Batty, M.D.     JB/MEDQ  D:  09/02/2009  T:  09/02/2009  Job:  [id number]   cc:   [facility]  2nd floor [facility] PV/angiographic ste  Marcos Eke. Hal Hope, M.D.  [facility]   Electronically Signed by Nanetta Batty M.D. on 09/10/2009 03:27:09 PM

## 2010-09-09 NOTE — Assessment & Plan Note (Signed)
OFFICE VISIT   Chris Payne, Chris Payne  DOB:  01-30-1951                                       05/05/2010  CHART#:[id number]   REASON FOR VISIT:  Followup.   HISTORY:  This is a 60 year old gentleman who underwent emergent repair  of a symptomatic abdominal aortic aneurysm on October 03, 2009.  He  underwent reimplantation of a right accessory renal artery as well as  inferior mesenteric artery.  He did require return to the operating room  for bleeding where a lumbar artery was identified as the source and  ligated.  He suffered a pontine stroke postoperatively, he has been  recovering well, undergoing the appropriate therapies.  He comes back in  today.  He is ambulating by himself.  He does use a cane.  His speech is  back to normal.  He still has some bowel constipation issues that his  appetite is very healthy.   PHYSICAL EXAMINATION:  Vital signs:  His heart rate is 101, blood  pressure 128/84, respiratory rate 20.  General:  He is well-appearing,  no distress, respirations are nonlabored.  Cardiovascular:  Regular rate  and rhythm.  Abdomen:  Soft, nontender.  Midline incision is well healed  without hernia.  Extremities:  Warm and well-perfused without edema.   ASSESSMENT:  Status post open aneurysm repair.   PLAN:  The patient is recovering nicely.  I will plan on seeing him back  in 6 months.     Jorge Ny, MD  Electronically Signed   VWB/MEDQ  D:  05/05/2010  T:  05/06/2010  Job:  [id number]

## 2010-09-10 ENCOUNTER — Encounter (HOSPITAL_COMMUNITY): Payer: Medicare Other

## 2010-09-12 ENCOUNTER — Encounter (HOSPITAL_COMMUNITY): Payer: Medicare Other

## 2010-09-15 ENCOUNTER — Encounter (HOSPITAL_COMMUNITY): Payer: Medicare Other

## 2010-09-17 ENCOUNTER — Encounter (HOSPITAL_COMMUNITY): Payer: Medicare Other

## 2010-09-19 ENCOUNTER — Encounter (HOSPITAL_COMMUNITY): Payer: Medicare Other

## 2010-09-22 ENCOUNTER — Encounter (HOSPITAL_COMMUNITY): Payer: Medicare Other

## 2010-09-24 ENCOUNTER — Encounter (HOSPITAL_COMMUNITY): Payer: Medicare Other

## 2010-09-26 ENCOUNTER — Encounter (HOSPITAL_COMMUNITY): Payer: Medicare Other

## 2010-09-29 ENCOUNTER — Encounter (HOSPITAL_COMMUNITY): Payer: Medicare Other

## 2010-09-29 ENCOUNTER — Encounter (HOSPITAL_COMMUNITY)
Admission: RE | Admit: 2010-09-29 | Discharge: 2010-09-29 | Disposition: A | Discharge status: Home / Self Care | Payer: Medicare Other | Source: Ambulatory Visit | Attending: Cardiovascular Disease | Admitting: Cardiovascular Disease

## 2010-09-29 DIAGNOSIS — Z8673 Personal history of transient ischemic attack (TIA), and cerebral infarction without residual deficits: Secondary | ICD-10-CM | POA: Insufficient documentation

## 2010-09-29 DIAGNOSIS — I200 Unstable angina: Secondary | ICD-10-CM | POA: Insufficient documentation

## 2010-09-29 DIAGNOSIS — F17200 Nicotine dependence, unspecified, uncomplicated: Secondary | ICD-10-CM | POA: Insufficient documentation

## 2010-09-29 DIAGNOSIS — Z7982 Long term (current) use of aspirin: Secondary | ICD-10-CM | POA: Insufficient documentation

## 2010-09-29 DIAGNOSIS — Z951 Presence of aortocoronary bypass graft: Secondary | ICD-10-CM | POA: Insufficient documentation

## 2010-09-29 DIAGNOSIS — E785 Hyperlipidemia, unspecified: Secondary | ICD-10-CM | POA: Insufficient documentation

## 2010-09-29 DIAGNOSIS — I10 Essential (primary) hypertension: Secondary | ICD-10-CM | POA: Insufficient documentation

## 2010-09-29 DIAGNOSIS — I6529 Occlusion and stenosis of unspecified carotid artery: Secondary | ICD-10-CM | POA: Insufficient documentation

## 2010-09-29 DIAGNOSIS — I2510 Atherosclerotic heart disease of native coronary artery without angina pectoris: Secondary | ICD-10-CM | POA: Insufficient documentation

## 2010-09-29 DIAGNOSIS — Z5189 Encounter for other specified aftercare: Secondary | ICD-10-CM | POA: Insufficient documentation

## 2010-10-01 ENCOUNTER — Encounter (HOSPITAL_COMMUNITY): Payer: Medicare Other

## 2010-10-03 ENCOUNTER — Encounter (HOSPITAL_COMMUNITY): Payer: Medicare Other

## 2010-10-06 ENCOUNTER — Encounter (HOSPITAL_COMMUNITY): Payer: Medicare Other

## 2010-10-08 ENCOUNTER — Encounter (HOSPITAL_COMMUNITY): Payer: Medicare Other

## 2010-10-10 ENCOUNTER — Encounter (HOSPITAL_COMMUNITY): Payer: Medicare Other

## 2010-10-13 ENCOUNTER — Encounter (HOSPITAL_COMMUNITY): Payer: Medicare Other

## 2010-10-15 ENCOUNTER — Encounter (HOSPITAL_COMMUNITY): Payer: Medicare Other

## 2010-10-17 ENCOUNTER — Encounter (HOSPITAL_COMMUNITY): Payer: Medicare Other

## 2010-10-20 ENCOUNTER — Encounter (HOSPITAL_COMMUNITY): Payer: Medicare Other

## 2010-10-22 ENCOUNTER — Encounter (HOSPITAL_COMMUNITY): Payer: Medicare Other

## 2010-10-24 ENCOUNTER — Encounter (HOSPITAL_COMMUNITY): Payer: Medicare Other

## 2010-10-27 ENCOUNTER — Encounter (HOSPITAL_COMMUNITY): Payer: Medicare Other | Attending: Cardiovascular Disease

## 2010-10-27 ENCOUNTER — Encounter (HOSPITAL_COMMUNITY): Payer: Medicare Other

## 2010-10-27 DIAGNOSIS — I6529 Occlusion and stenosis of unspecified carotid artery: Secondary | ICD-10-CM | POA: Insufficient documentation

## 2010-10-27 DIAGNOSIS — Z7982 Long term (current) use of aspirin: Secondary | ICD-10-CM | POA: Insufficient documentation

## 2010-10-27 DIAGNOSIS — Z5189 Encounter for other specified aftercare: Secondary | ICD-10-CM | POA: Insufficient documentation

## 2010-10-27 DIAGNOSIS — Z951 Presence of aortocoronary bypass graft: Secondary | ICD-10-CM | POA: Insufficient documentation

## 2010-10-27 DIAGNOSIS — I2510 Atherosclerotic heart disease of native coronary artery without angina pectoris: Secondary | ICD-10-CM | POA: Insufficient documentation

## 2010-10-27 DIAGNOSIS — I200 Unstable angina: Secondary | ICD-10-CM | POA: Insufficient documentation

## 2010-10-27 DIAGNOSIS — I10 Essential (primary) hypertension: Secondary | ICD-10-CM | POA: Insufficient documentation

## 2010-10-27 DIAGNOSIS — Z8673 Personal history of transient ischemic attack (TIA), and cerebral infarction without residual deficits: Secondary | ICD-10-CM | POA: Insufficient documentation

## 2010-10-27 DIAGNOSIS — F17200 Nicotine dependence, unspecified, uncomplicated: Secondary | ICD-10-CM | POA: Insufficient documentation

## 2010-10-27 DIAGNOSIS — E785 Hyperlipidemia, unspecified: Secondary | ICD-10-CM | POA: Insufficient documentation

## 2010-10-29 ENCOUNTER — Encounter (HOSPITAL_COMMUNITY): Payer: Medicare Other

## 2010-10-31 ENCOUNTER — Encounter (HOSPITAL_COMMUNITY): Payer: Medicare Other

## 2010-11-03 ENCOUNTER — Encounter (HOSPITAL_COMMUNITY): Payer: Medicare Other

## 2010-11-05 ENCOUNTER — Encounter (HOSPITAL_COMMUNITY): Payer: Medicare Other

## 2010-11-07 ENCOUNTER — Encounter (HOSPITAL_COMMUNITY): Payer: Medicare Other

## 2010-11-10 ENCOUNTER — Encounter (HOSPITAL_COMMUNITY): Payer: Medicare Other

## 2010-11-10 ENCOUNTER — Ambulatory Visit: Payer: Medicare Other | Admitting: Surgery

## 2010-11-12 ENCOUNTER — Encounter (HOSPITAL_COMMUNITY): Payer: Medicare Other

## 2010-11-14 ENCOUNTER — Encounter (HOSPITAL_COMMUNITY): Payer: Medicare Other

## 2010-11-17 ENCOUNTER — Encounter (HOSPITAL_COMMUNITY): Payer: Medicare Other

## 2010-11-19 ENCOUNTER — Encounter (HOSPITAL_COMMUNITY): Payer: Medicare Other

## 2010-11-21 ENCOUNTER — Encounter (HOSPITAL_COMMUNITY): Payer: Medicare Other

## 2010-11-24 ENCOUNTER — Encounter (HOSPITAL_COMMUNITY): Payer: Medicare Other

## 2010-11-26 ENCOUNTER — Encounter (HOSPITAL_COMMUNITY): Payer: Medicare Other

## 2010-11-28 ENCOUNTER — Encounter (HOSPITAL_COMMUNITY): Payer: Medicare Other | Attending: Cardiovascular Disease

## 2010-11-28 DIAGNOSIS — E785 Hyperlipidemia, unspecified: Secondary | ICD-10-CM | POA: Insufficient documentation

## 2010-11-28 DIAGNOSIS — Z5189 Encounter for other specified aftercare: Secondary | ICD-10-CM | POA: Insufficient documentation

## 2010-11-28 DIAGNOSIS — F17200 Nicotine dependence, unspecified, uncomplicated: Secondary | ICD-10-CM | POA: Insufficient documentation

## 2010-11-28 DIAGNOSIS — I6529 Occlusion and stenosis of unspecified carotid artery: Secondary | ICD-10-CM | POA: Insufficient documentation

## 2010-11-28 DIAGNOSIS — I2510 Atherosclerotic heart disease of native coronary artery without angina pectoris: Secondary | ICD-10-CM | POA: Insufficient documentation

## 2010-11-28 DIAGNOSIS — I10 Essential (primary) hypertension: Secondary | ICD-10-CM | POA: Insufficient documentation

## 2010-11-28 DIAGNOSIS — Z8673 Personal history of transient ischemic attack (TIA), and cerebral infarction without residual deficits: Secondary | ICD-10-CM | POA: Insufficient documentation

## 2010-11-28 DIAGNOSIS — Z7982 Long term (current) use of aspirin: Secondary | ICD-10-CM | POA: Insufficient documentation

## 2010-11-28 DIAGNOSIS — Z951 Presence of aortocoronary bypass graft: Secondary | ICD-10-CM | POA: Insufficient documentation

## 2010-11-28 DIAGNOSIS — I200 Unstable angina: Secondary | ICD-10-CM | POA: Insufficient documentation

## 2010-12-01 ENCOUNTER — Encounter (HOSPITAL_COMMUNITY): Payer: Medicare Other

## 2010-12-02 HISTORY — PX: NM MYOCAR PERF WALL MOTION: HXRAD629

## 2010-12-03 ENCOUNTER — Encounter (HOSPITAL_COMMUNITY): Payer: Medicare Other

## 2010-12-05 ENCOUNTER — Encounter (HOSPITAL_COMMUNITY): Payer: Medicare Other

## 2010-12-08 ENCOUNTER — Encounter (HOSPITAL_COMMUNITY): Payer: Medicare Other

## 2010-12-10 ENCOUNTER — Encounter (HOSPITAL_COMMUNITY): Payer: Medicare Other

## 2010-12-12 ENCOUNTER — Encounter: Payer: Self-pay | Admitting: Surgery

## 2010-12-12 ENCOUNTER — Encounter (HOSPITAL_COMMUNITY): Payer: Medicare Other

## 2010-12-15 ENCOUNTER — Encounter (HOSPITAL_COMMUNITY): Payer: Medicare Other

## 2010-12-17 ENCOUNTER — Encounter (HOSPITAL_COMMUNITY): Payer: Medicare Other

## 2010-12-19 ENCOUNTER — Encounter (HOSPITAL_COMMUNITY): Payer: Medicare Other

## 2010-12-22 ENCOUNTER — Encounter (HOSPITAL_COMMUNITY): Payer: Medicare Other

## 2010-12-24 ENCOUNTER — Encounter (HOSPITAL_COMMUNITY): Payer: Medicare Other

## 2010-12-26 ENCOUNTER — Encounter (HOSPITAL_COMMUNITY): Payer: Medicare Other

## 2010-12-29 ENCOUNTER — Encounter (HOSPITAL_COMMUNITY): Payer: Medicare Other

## 2010-12-31 ENCOUNTER — Encounter (HOSPITAL_COMMUNITY): Payer: Medicare Other | Attending: Cardiovascular Disease

## 2010-12-31 DIAGNOSIS — I2510 Atherosclerotic heart disease of native coronary artery without angina pectoris: Secondary | ICD-10-CM | POA: Insufficient documentation

## 2010-12-31 DIAGNOSIS — Z5189 Encounter for other specified aftercare: Secondary | ICD-10-CM | POA: Insufficient documentation

## 2010-12-31 DIAGNOSIS — I10 Essential (primary) hypertension: Secondary | ICD-10-CM | POA: Insufficient documentation

## 2010-12-31 DIAGNOSIS — E785 Hyperlipidemia, unspecified: Secondary | ICD-10-CM | POA: Insufficient documentation

## 2010-12-31 DIAGNOSIS — Z7982 Long term (current) use of aspirin: Secondary | ICD-10-CM | POA: Insufficient documentation

## 2010-12-31 DIAGNOSIS — I6529 Occlusion and stenosis of unspecified carotid artery: Secondary | ICD-10-CM | POA: Insufficient documentation

## 2010-12-31 DIAGNOSIS — Z8673 Personal history of transient ischemic attack (TIA), and cerebral infarction without residual deficits: Secondary | ICD-10-CM | POA: Insufficient documentation

## 2010-12-31 DIAGNOSIS — Z951 Presence of aortocoronary bypass graft: Secondary | ICD-10-CM | POA: Insufficient documentation

## 2010-12-31 DIAGNOSIS — I200 Unstable angina: Secondary | ICD-10-CM | POA: Insufficient documentation

## 2010-12-31 DIAGNOSIS — F17200 Nicotine dependence, unspecified, uncomplicated: Secondary | ICD-10-CM | POA: Insufficient documentation

## 2011-01-02 ENCOUNTER — Encounter (HOSPITAL_COMMUNITY): Payer: Medicare Other

## 2011-01-05 ENCOUNTER — Encounter (HOSPITAL_COMMUNITY): Payer: Medicare Other

## 2011-01-05 ENCOUNTER — Ambulatory Visit: Payer: Self-pay | Admitting: Surgery

## 2011-01-07 ENCOUNTER — Encounter (HOSPITAL_COMMUNITY): Payer: Medicare Other

## 2011-01-09 ENCOUNTER — Encounter (HOSPITAL_COMMUNITY): Payer: Medicare Other

## 2011-01-12 ENCOUNTER — Encounter (HOSPITAL_COMMUNITY): Payer: Medicare Other

## 2011-01-14 ENCOUNTER — Encounter (HOSPITAL_COMMUNITY): Payer: Medicare Other

## 2011-01-16 ENCOUNTER — Encounter (HOSPITAL_COMMUNITY): Payer: Medicare Other

## 2011-01-19 ENCOUNTER — Encounter (HOSPITAL_COMMUNITY): Payer: Medicare Other

## 2011-01-21 ENCOUNTER — Encounter (HOSPITAL_COMMUNITY): Payer: Medicare Other

## 2011-01-23 ENCOUNTER — Encounter (HOSPITAL_COMMUNITY): Payer: Medicare Other

## 2011-01-23 ENCOUNTER — Encounter: Payer: Self-pay | Admitting: Surgery

## 2011-01-26 ENCOUNTER — Ambulatory Visit (INDEPENDENT_AMBULATORY_CARE_PROVIDER_SITE_OTHER): Payer: MEDICARE | Admitting: Surgery

## 2011-01-26 ENCOUNTER — Encounter (HOSPITAL_COMMUNITY): Payer: Medicare Other

## 2011-01-26 ENCOUNTER — Encounter: Payer: Self-pay | Admitting: Surgery

## 2011-01-26 VITALS — BP 193/100 | HR 54 | Resp 20 | Ht 67.0 in | Wt 170.0 lb

## 2011-01-26 DIAGNOSIS — I714 Abdominal aortic aneurysm, without rupture, unspecified: Secondary | ICD-10-CM | POA: Insufficient documentation

## 2011-01-26 DIAGNOSIS — Z9889 Other specified postprocedural states: Secondary | ICD-10-CM

## 2011-01-26 NOTE — Progress Notes (Signed)
[facility]   Patient name: Chris Payne MRN: [id number] DOB: 01-27-1951 Sex: male     Chief Complaint  Patient presents with  . Follow-up    AAA     HISTORY OF PRESENT ILLNESS: The patient returns today for followup. He was originally scheduled to see me the summer of 2011 4 claudication. However, on 10/03/2009 he presented to the emergency department with abdominal pain and known aortic aneurysm. He also had an occluded left iliac arterial system. At that time it was presented he had a symptomatic abdominal aortic aneurysm and was taken urgently to the operating room for open repair which consisted of an aortobifemoral bypass graft. This was a 16 x 8 graft. At that time I reimplanted in the large accessory right renal artery as well as his inferior mesenteric artery. Postoperatively he was found to have a lumbar artery bleed which required a return trip to the operating room for ligation of a bleeding lumbar artery. He suffered a pontine stroke during the perioperative period. He did however I recover from this. He is back today for followup. He stated he continues to have back pain which does limit his ability to walk. He is using a walker for ambulation. He also complains of a little bit of the inguinal pain otherwise he is doing very well his appetite is back to normal he is gaining weight.  Past Medical History  Diagnosis Date  . Hypertension   . Stroke   . Arthritis   . Leg pain     with walking  . Chest pain   . Weight loss   . AAA (abdominal aortic aneurysm)   . Hyperlipidemia   . CAD (coronary artery disease)     Past Surgical History  Procedure Date  . Hip surgery 40 years ago    left hip removal and pinning  . Abdominal aortic aneurysm repair 10-03-2009  . Exploratory laparotomy 09/2009    ligation of lumbar arteries    History   Social History  . Marital Status: Single    Spouse Name: N/A    Number of Children: N/A  . Years of  Education: N/A   Occupational History  . Not on file.   Social History Main Topics  . Smoking status: Former Smoker -- 1.0 packs/day    Types: Cigarettes    Quit date: 01/25/2010  . Smokeless tobacco: Not on file  . Alcohol Use: No     social drinker  . Drug Use: No  . Sexually Active:    Other Topics Concern  . Not on file   Social History Narrative  . No narrative on file    Family History  Problem Relation Age of Onset  . Coronary artery disease Other   . Alcohol abuse Father   . Cancer Sister   . Heart disease Sister     Allergies as of 01/26/2011  . (No Known Allergies)    Current Outpatient Prescriptions on File Prior to Visit  Medication Sig Dispense Refill  . aspirin 81 MG tablet Take 81 mg by mouth daily.        . celecoxib (CELEBREX) 200 MG capsule Take 200 mg by mouth daily.        Marland Kitchen docusate sodium (COLACE) 100 MG capsule Take 100 mg by mouth 2 (two) times daily.        . folic acid (FOLVITE) 1 MG tablet Take 1 mg by mouth daily.        Marland Kitchen  HYDROcodone-acetaminophen (LORTAB) 10-500 MG per tablet Take 1 tablet by mouth every 6 (six) hours as needed.        Marland Kitchen lisinopril (PRINIVIL,ZESTRIL) 10 MG tablet Take 10 mg by mouth daily.        . pantoprazole (PROTONIX) 40 MG tablet Take 40 mg by mouth daily.        Marland Kitchen PRAVASTATIN SODIUM PO Take by mouth daily.        . Thiamine HCl (VITAMIN B-1) 100 MG tablet Take 100 mg by mouth daily.           REVIEW OF SYSTEMS: Positive for weight gain stroke diarrhea constipation arthritis joint pain or muscle pain all her to systems negative as documented in the encounter for PHYSICAL EXAMINATION: General: The patient appears their stated age.  Vital signs are BP 193/100  Pulse 54  Resp 20  Ht 5\' 7"  (1.702 m)  Wt 170 lb (77.111 kg)  BMI 26.63 kg/m2  SpO2 96% Pulmonary: There is a good air exchange bilaterally without wheezing or rales. Abdomen: Soft and non-tender with normal pitch bowel sounds. Midline incision is  well-healed without hernia inguinal incisions are well-healed without hernia or seroma Musculoskeletal: There are no major deformities.  There is no significant extremity pain. Bilateral lower extremity edema Neurologic: No focal weakness or paresthesias are detected, Skin: There are no ulcer or rashes noted. Psychiatric: The patient has normal affect. Cardiovascular: There is a regular rate and rhythm without significant murmur appreciated.   Diagnostic Studies None Assessment: Status post open aortic aneurysm repair Plan: Overall patient doing very well. He is nearly recovered from his stroke. His appetite is back to normal and he is gaining weight his bowels are regulated from my perspective he is doing very well this time. He is getting rest of his cardiovascular care with Dr. Allyson Sabal. He is due to have carotid ultrasound and ABIs done in an immediate future. At this point mosof his vascular surgical issues are resolved. He will continue to need surveillance ultrasound of his abdominal aorta to make sure there were no aneurysmal changes at the proximal and distal anastomoses of his grafts. I scheduled him to come back to see me in 2 years. He will get his ultrasound studies at Dr. Hazle Coca office  V. Charlena Cross, M.D. [facility] [place] Office: [phone]

## 2011-01-28 ENCOUNTER — Other Ambulatory Visit: Payer: Self-pay

## 2011-01-28 ENCOUNTER — Encounter (HOSPITAL_COMMUNITY): Payer: Medicare Other

## 2011-01-28 DIAGNOSIS — I10 Essential (primary) hypertension: Secondary | ICD-10-CM

## 2011-01-28 DIAGNOSIS — G8918 Other acute postprocedural pain: Secondary | ICD-10-CM

## 2011-01-28 MED ORDER — LISINOPRIL 10 MG PO TABS: 10.0000 mg | ORAL_TABLET | Freq: Every day | ORAL | Status: DC

## 2011-01-28 MED ORDER — HYDROCODONE-ACETAMINOPHEN 10-500 MG PO TABS: 1.0000 | ORAL_TABLET | Freq: Four times a day (QID) | ORAL | Status: DC | PRN

## 2011-01-28 NOTE — Telephone Encounter (Signed)
Son called and stated pt. saw Dr. Myra Gianotti on 01/26/11, and was to have prescriptions called to pharmacy to refill Lortab and Lisinopril.  Phonecall to Dr. Myra Gianotti and rec'd telephone order to refill pt's medication as requested.

## 2011-01-30 ENCOUNTER — Encounter (HOSPITAL_COMMUNITY): Payer: Medicare Other

## 2011-02-14 ENCOUNTER — Other Ambulatory Visit: Payer: Self-pay | Admitting: Surgery

## 2011-02-16 ENCOUNTER — Other Ambulatory Visit: Payer: Self-pay | Admitting: Surgery

## 2011-03-30 IMAGING — CR DG CHEST 1V PORT
1 series · 1 of 1 positions shown · non-contrast
Comparison: 10/10/2009.

CLINICAL DATA: Ruptured abdominal aortic aneurysm.  Follow-up
radiographs.

PORTABLE CHEST - 1 VIEW

[view not recorded]
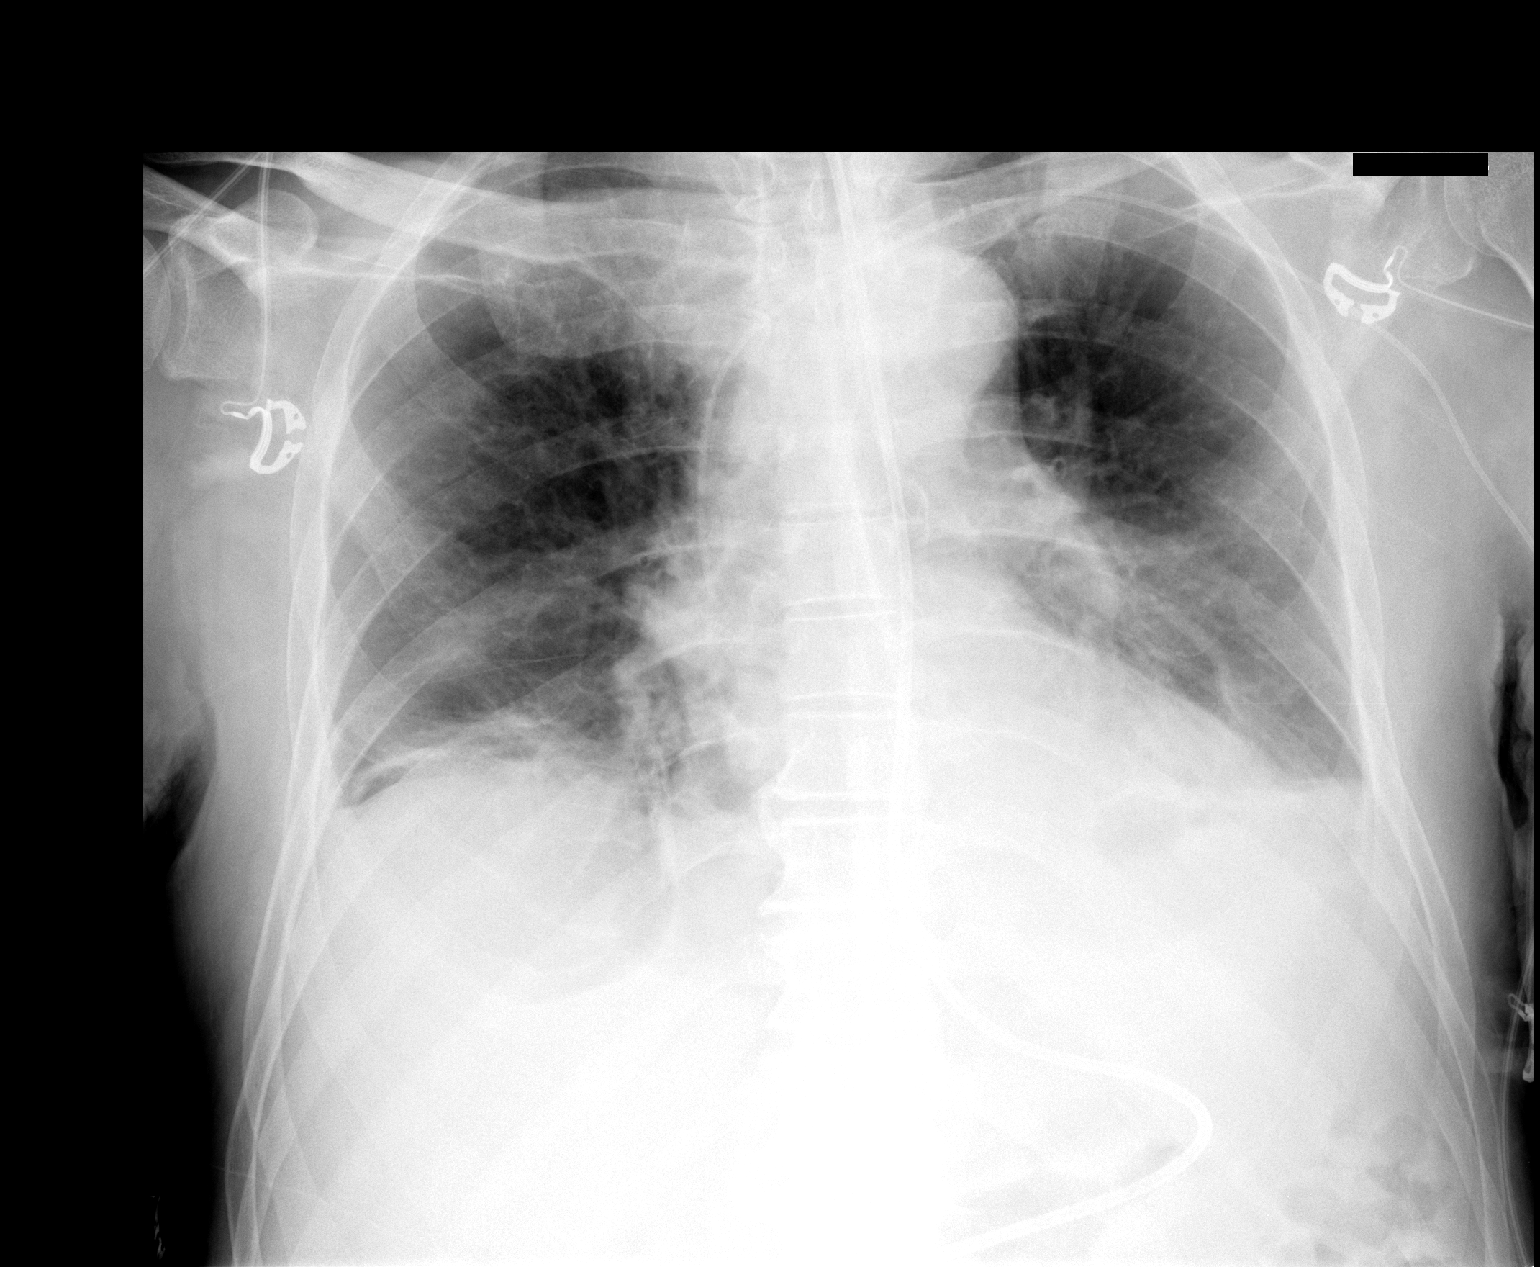

[1 of 1 positions shown; findings below may reference images not displayed]

FINDINGS: Feeding tube present within the esophagus.  Left upper
extremity PICC.  Increasing bilateral basilar atelectasis.  Small
left pleural effusion.  Cardiomediastinal contours unchanged with
tortuous thoracic aorta.  Pulmonary vascular congestion.
IMPRESSION: 1.  Exchange of nasogastric tube for feeding tube.
2.  Unchanged left upper extremity PICC.
3.  Increasing basilar atelectasis.

## 2011-03-31 IMAGING — CR DG CHEST 1V PORT
1 series · 1 of 1 positions shown · non-contrast
Comparison: Chest 10/11/2009.

CLINICAL DATA: Shortness of breath.  Ruptured abdominal aortic
aneurysm.

PORTABLE CHEST - 1 VIEW

[AP]
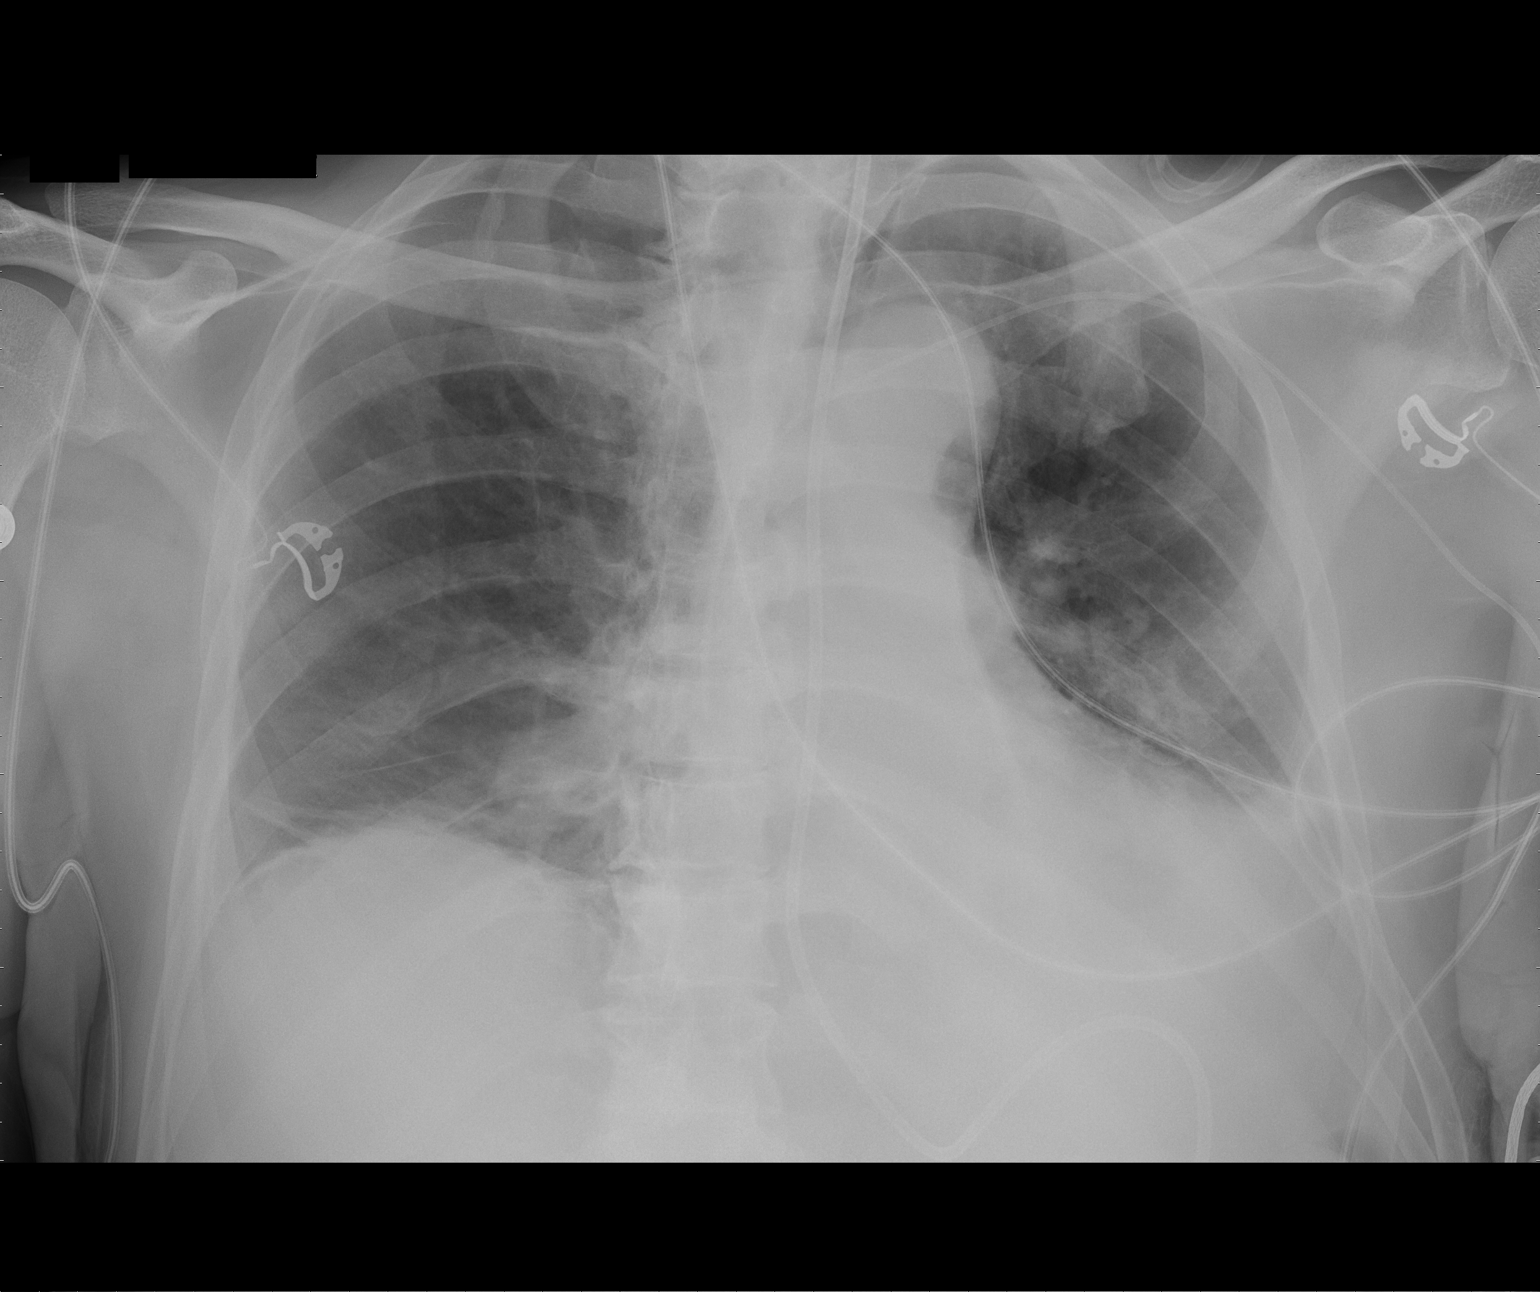

[1 of 1 positions shown; findings below may reference images not displayed]

FINDINGS: Support apparatus is unchanged.  There has been interval
increase in left effusion and basilar airspace disease.  Streaky
atelectasis in the right lung base is unchanged.  Heart size is
upper normal.
IMPRESSION: Some increase in small left effusion basilar airspace disease.  No
other change.

## 2011-04-01 IMAGING — CR DG ABD PORTABLE 1V
1 series · 1 of 1 positions shown · non-contrast
Comparison: 10/11/2009

CLINICAL DATA: Feeding tube

ABDOMEN - 1 VIEW

[AP]
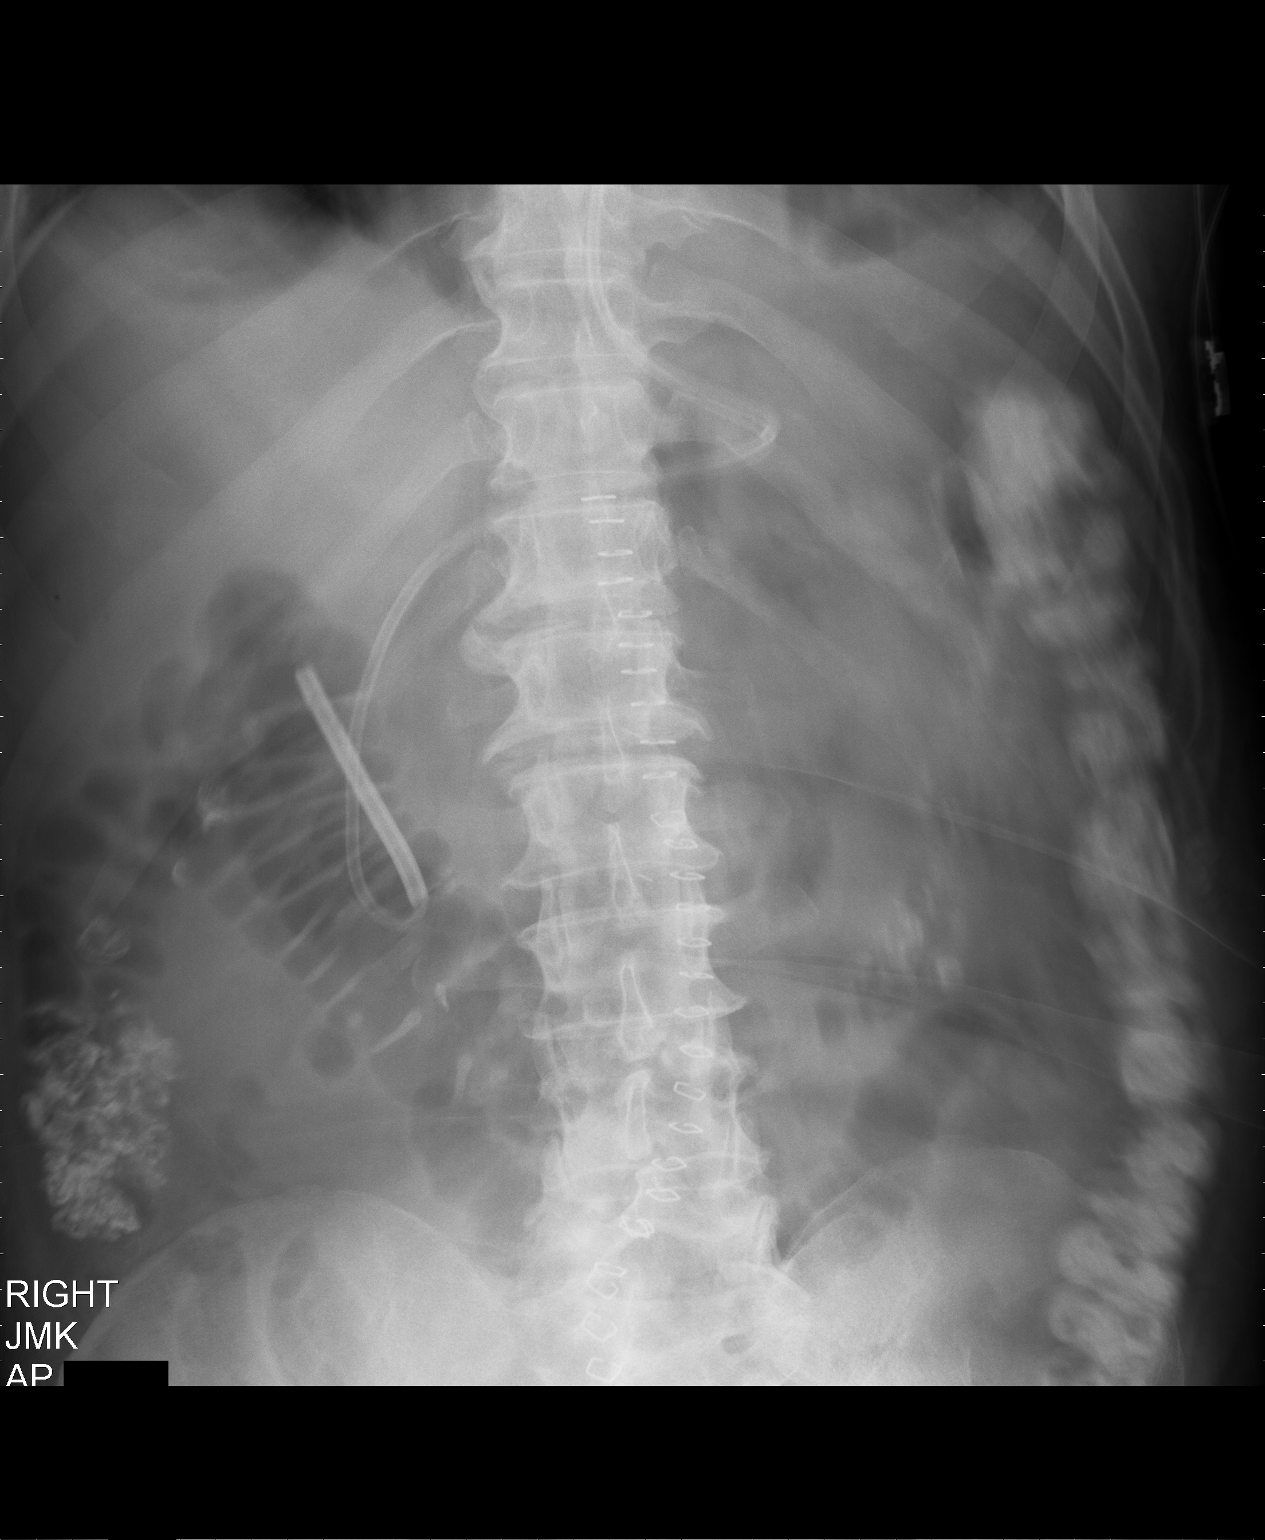

[1 of 1 positions shown; findings below may reference images not displayed]

FINDINGS: The tip of the feeding tube is coiled in the second
portion of the duodenum.  Bowel gas pattern is stable.  Contrast
now fills the descending colon.
IMPRESSION: Feeding tube is coiled in the second portion of the duodenum.

## 2011-07-28 ENCOUNTER — Encounter (HOSPITAL_COMMUNITY): Payer: Self-pay | Admitting: *Deleted

## 2011-07-28 ENCOUNTER — Emergency Department (INDEPENDENT_AMBULATORY_CARE_PROVIDER_SITE_OTHER)
Admission: EM | Admit: 2011-07-28 | Discharge: 2011-07-28 | Disposition: A | Discharge status: Home / Self Care | Payer: Medicare Other | Source: Home / Self Care | Attending: Emergency Medicine | Admitting: Emergency Medicine

## 2011-07-28 DIAGNOSIS — B3789 Other sites of candidiasis: Secondary | ICD-10-CM

## 2011-07-28 MED ORDER — NYSTATIN EX POWD: CUTANEOUS | Status: DC

## 2011-07-28 MED ORDER — NYSTATIN 100000 UNIT/GM EX OINT: TOPICAL_OINTMENT | Freq: Two times a day (BID) | CUTANEOUS | Status: DC

## 2011-07-28 NOTE — Discharge Instructions (Signed)
As discussed use prescribed powder as area continues to be wet and humid. When area becomes drier switched to prescribe ointment. As well we have encouraged to followup with her primary care doctor as it looks like you have been seen at [facility] urgent care and you have seen a primary care doctor as you have multiple medical problems and need consistent continuity of care.  Try to keep area dry as much as possible as this is the major factor for you to continue having this infections     Candida Infection, Adult A candida infection (also called yeast, fungus and Monilia infection) is an overgrowth of yeast that can occur anywhere on the body. A yeast infection commonly occurs in warm, moist body areas. Usually, the infection remains localized but can spread to become a systemic infection. A yeast infection may be a sign of a more severe disease such as diabetes, leukemia, or AIDS. A yeast infection can occur in both men and women. In women, Candida vaginitis is a vaginal infection. It is one of the most common causes of vaginitis. Men usually do not have symptoms or know they have an infection until other problems develop. Men may find out they have a yeast infection because their sex partner has a yeast infection. Uncircumcised men are more likely to get a yeast infection than circumcised men. This is because the uncircumcised glans is not exposed to air and does not remain as dry as that of a circumcised glans. Older adults may develop yeast infections around dentures. CAUSES  Women  Antibiotics.   Steroid medication taken for a long time.   Being overweight (obese).   Diabetes.   Poor immune condition.   Certain serious medical conditions.   Immune suppressive medications for organ transplant patients.   Chemotherapy.   Pregnancy.   Menstration.   Stress and fatigue.   Intravenous drug use.   Oral contraceptives.   Wearing tight-fitting clothes in the crotch area.    Catching it from a sex partner who has a yeast infection.   Spermicide.   Intravenous, urinary, or other catheters.  Men  Catching it from a sex partner who has a yeast infection.   Having oral or anal sex with a person who has the infection.   Spermicide.   Diabetes.   Antibiotics.   Poor immune system.   Medications that suppress the immune system.   Intravenous drug use.   Intravenous, urinary, or other catheters.  SYMPTOMS  Women  Thick, white vaginal discharge.   Vaginal itching.   Redness and swelling in and around the vagina.   Irritation of the lips of the vagina and perineum.   Blisters on the vaginal lips and perineum.   Painful sexual intercourse.   Low blood sugar (hypoglycemia).   Painful urination.   Bladder infections.   Intestinal problems such as constipation, indigestion, bad breath, bloating, increase in gas, diarrhea, or loose stools.  Men  Men may develop intestinal problems such as constipation, indigestion, bad breath, bloating, increase in gas, diarrhea, or loose stools.   Dry, cracked skin on the penis with itching or discomfort.   Jock itch.   Dry, flaky skin.   Athlete's foot.   Hypoglycemia.  DIAGNOSIS  Women  A history and an exam are performed.   The discharge may be examined under a microscope.   A culture may be taken of the discharge.  Men  A history and an exam are performed.   Any discharge from  the penis or areas of cracked skin will be looked at under the microscope and cultured.   Stool samples may be cultured.  TREATMENT  Women  Vaginal antifungal suppositories and creams.   Medicated creams to decrease irritation and itching on the outside of the vagina.   Warm compresses to the perineal area to decrease swelling and discomfort.   Oral antifungal medications.   Medicated vaginal suppositories or cream for repeated or recurrent infections.   Wash and dry the irritation areas before  applying the cream.   Eating yogurt with lactobacillus may help with prevention and treatment.   Sometimes painting the vagina with gentian violet solution may help if creams and suppositories do not work.  Men  Antifungal creams and oral antifungal medications.   Sometimes treatment must continue for 30 days after the symptoms go away to prevent recurrence.  HOME CARE INSTRUCTIONS  Women  Use cotton underwear and avoid tight-fitting clothing.   Avoid colored, scented toilet paper and deodorant tampons or pads.   Do not douche.   Keep your diabetes under control.   Finish all the prescribed medications.   Keep your skin clean and dry.   Consume milk or yogurt with lactobacillus active culture regularly. If you get frequent yeast infections and think that is what the infection is, there are over-the-counter medications that you can get. If the infection does not show healing in 3 days, talk to your caregiver.   Tell your sex partner you have a yeast infection. Your partner may need treatment also, especially if your infection does not clear up or recurs.  Men  Keep your skin clean and dry.   Keep your diabetes under control.   Finish all prescribed medications.   Tell your sex partner that you have a yeast infection so they can be treated if necessary.  SEEK MEDICAL CARE IF:   Your symptoms do not clear up or worsen in one week after treatment.   You have an oral temperature above 102 F (38.9 C).   You have trouble swallowing or eating for a prolonged time.   You develop blisters on and around your vagina.   You develop vaginal bleeding and it is not your menstrual period.   You develop abdominal pain.   You develop intestinal problems as mentioned above.   You get weak or lightheaded.   You have painful or increased urination.   You have pain during sexual intercourse.  MAKE SURE YOU:   Understand these instructions.   Will watch your condition.    Will get help right away if you are not doing well or get worse.  Document Released: 05/21/2004 Document Revised: 04/02/2011 Document Reviewed: 09/02/2009 [facility] Patient Information 2012 [place], [place].

## 2011-07-28 NOTE — ED Notes (Signed)
Pt here c/o yeast infection between his legs onset 2 months ago.  States he was seen at [facility] and given some type of cream ( with 1 refill) and has used it all up and it's not any better.  States he's been using Neosporin and it's caused it to scab over.

## 2011-07-28 NOTE — ED Provider Notes (Signed)
History     CSN: [id number]  Arrival date & time 07/28/11  1120   First MD Initiated Contact with Patient 07/28/11 1301      No chief complaint on file.   (Consider location/radiation/quality/duration/timing/severity/associated sxs/prior treatment) HPI Comments: Patient has been having this yeast infection both of his groins area for months went to another clinical facility uncertain of the medicine that he tried before it seemed to have helped but as he stopped irritation and rash and itchiness have returned in both groin areas. Since then he has been trying with Neosporin cream and has been putting a lot of it both of his groins but this doesn't seem to be working  Patient is a 61 y.o. male presenting with rash.  Rash  This is a new problem. The current episode started more than 1 week ago. The problem has not changed since onset.There has been no fever. The rash is present on the groin. The pain is at a severity of 0/10. The patient is experiencing no pain. The pain has been constant since onset. Associated symptoms include itching. Pertinent negatives include no weeping. He has tried nothing for the symptoms.    Past Medical History  Diagnosis Date  . Hypertension   . Stroke   . Arthritis   . Leg pain     with walking  . Chest pain   . Weight loss   . AAA (abdominal aortic aneurysm)   . Hyperlipidemia   . CAD (coronary artery disease)     Past Surgical History  Procedure Date  . Hip surgery 40 years ago    left hip removal and pinning  . Abdominal aortic aneurysm repair 10-03-2009  . Exploratory laparotomy 09/2009    ligation of lumbar arteries  . Abdominal aortic aneurysm repair     Family History  Problem Relation Age of Onset  . Coronary artery disease Other   . Alcohol abuse Father   . Cancer Sister   . Heart disease Sister     History  Substance Use Topics  . Smoking status: Former Smoker -- 1.0 packs/day    Types: Cigarettes    Quit date: 01/25/2010  .  Smokeless tobacco: Not on file  . Alcohol Use: No     former drinker      Review of Systems  Constitutional: Negative for fever, chills and activity change.  Skin: Positive for itching and rash. Negative for color change and wound.    Allergies  Review of patient's allergies indicates no known allergies.  Home Medications   Current Outpatient Rx  Name Route Sig Dispense Refill  . ASPIRIN 81 MG PO TABS Oral Take 81 mg by mouth daily.      . CELECOXIB 200 MG PO CAPS Oral Take 200 mg by mouth daily.      Marland Kitchen DOCUSATE SODIUM 100 MG PO CAPS Oral Take 100 mg by mouth 2 (two) times daily.      Marland Kitchen NEXIUM PO Oral Take by mouth 1 day or 1 dose.    Marland Kitchen LISINOPRIL 10 MG PO TABS Oral Take 1 tablet (10 mg total) by mouth daily. 30 tablet 3  . PANTOPRAZOLE SODIUM 40 MG PO TBEC Oral Take 40 mg by mouth daily.      Marland Kitchen PRAVASTATIN SODIUM PO Oral Take by mouth daily.      Marland Kitchen FOLIC ACID 1 MG PO TABS Oral Take 1 mg by mouth daily.      Marland Kitchen HYDROCODONE-ACETAMINOPHEN 10-500 MG PO TABS  Oral Take 1 tablet by mouth every 6 (six) hours as needed. 30 tablet 0  . NYSTATIN 100000 UNIT/GM EX OINT Topical Apply topically 2 (two) times daily. Apply bid x 2 weeks- if area its dry- 30 g 0  . NYSTATIN EX POWD  Apply powder twice a day as area feels wet switch to cream when area becomes dry- x 3 weeks 1 Bottle 0  . VITAMIN B-1 100 MG PO TABS Oral Take 100 mg by mouth daily.        BP 138/99  Pulse 57  Temp(Src) 97.4 F (36.3 C) (Oral)  Resp 16  SpO2 100%  Physical Exam  Constitutional: He appears well-developed and well-nourished.  Skin: Rash noted. There is erythema.       ED Course  Procedures (including critical care time)  Labs Reviewed - No data to display No results found.   1. Candida rash of groin       MDM  Recurrent fungal inguinal infections. Patient was unaware that he needed to keep area dry and has been using Neosporin as he ran out of previously prescribed cream (unable to recall name).  Exam was consistent with Tinea cruris have encouraged patient to discontinue antibiotics and use powder and ointment for months of antifungal based on whether area is dry or wet patient understood plan of care. Patient describes that he has chronic pain and also requested some pain medicines. Patient is a active patient of, urgent care I have encouraged patient to call his doctor for further management of his chronic pain. Patient agreed to followup instructions        Jimmie Molly, MD 07/28/11 1438

## 2011-08-10 ENCOUNTER — Ambulatory Visit (INDEPENDENT_AMBULATORY_CARE_PROVIDER_SITE_OTHER): Payer: Medicare Other | Admitting: Family Medicine

## 2011-08-10 ENCOUNTER — Encounter: Payer: Self-pay | Admitting: Family Medicine

## 2011-08-10 VITALS — BP 134/90 | HR 72 | Temp 98.3°F | Resp 16 | Ht 65.0 in | Wt 180.0 lb

## 2011-08-10 DIAGNOSIS — F1011 Alcohol abuse, in remission: Secondary | ICD-10-CM

## 2011-08-10 DIAGNOSIS — I739 Peripheral vascular disease, unspecified: Secondary | ICD-10-CM

## 2011-08-10 DIAGNOSIS — Z87891 Personal history of nicotine dependence: Secondary | ICD-10-CM

## 2011-08-10 DIAGNOSIS — E785 Hyperlipidemia, unspecified: Secondary | ICD-10-CM

## 2011-08-10 DIAGNOSIS — I10 Essential (primary) hypertension: Secondary | ICD-10-CM

## 2011-08-10 DIAGNOSIS — I2510 Atherosclerotic heart disease of native coronary artery without angina pectoris: Secondary | ICD-10-CM

## 2011-08-10 DIAGNOSIS — I714 Abdominal aortic aneurysm, without rupture, unspecified: Secondary | ICD-10-CM

## 2011-08-10 DIAGNOSIS — I639 Cerebral infarction, unspecified: Secondary | ICD-10-CM

## 2011-08-10 DIAGNOSIS — M545 Low back pain, unspecified: Secondary | ICD-10-CM

## 2011-08-10 DIAGNOSIS — Z79899 Other long term (current) drug therapy: Secondary | ICD-10-CM

## 2011-08-10 DIAGNOSIS — G629 Polyneuropathy, unspecified: Secondary | ICD-10-CM

## 2011-08-10 LAB — COMPREHENSIVE METABOLIC PANEL
ALT: 15 U/L (ref 0–53)
CO2: 24 mEq/L (ref 19–32)
Calcium: 9.6 mg/dL (ref 8.4–10.5)
Chloride: 104 mEq/L (ref 96–112)
Creat: 0.86 mg/dL (ref 0.50–1.35)
Sodium: 140 mEq/L (ref 135–145)
Total Protein: 7.6 g/dL (ref 6.0–8.3)

## 2011-08-10 LAB — TSH: TSH: 0.945 u[IU]/mL (ref 0.350–4.500)

## 2011-08-10 LAB — VITAMIN B12: Vitamin B-12: 469 pg/mL (ref 211–911)

## 2011-08-10 LAB — LIPID PANEL: Cholesterol: 170 mg/dL (ref 0–200)

## 2011-08-10 MED ORDER — GABAPENTIN 100 MG PO CAPS: ORAL_CAPSULE | ORAL | Status: DC

## 2011-08-10 NOTE — Progress Notes (Signed)
  Subjective:    Patient ID: Chris Payne, male    DOB: 1950/05/02, 61 y.o.   MRN: [id number]  HPI Patient presents after absence from our practice. Multiple medical problems(see problem list)  Concerned about burning and swelling in his feet that has been progressive Symptoms limit ambulation  No history of DM, pernicious anemia, thyroid or liver disease History of heavy ETOH in the past.  LBP across lower back without radiation to buttocks or LE Denies weakness to LE  No history of CHF  Quit smoking 7/12 Quit drinking 7/12  Review of Systems     Objective:   Physical Exam  Constitutional: He appears well-developed.  HENT:  Mouth/Throat: Oropharynx is clear and moist.  Neck: Neck supple.  Cardiovascular: Normal rate, regular rhythm and normal heart sounds.   Pulses:      Dorsalis pedis pulses are 0 on the right side, and 0 on the left side.       Posterior tibial pulses are 0 on the right side, and 0 on the left side.       Unable to palpate distal pulses. Sluggish refill  Pulmonary/Chest: Effort normal and breath sounds normal.  Abdominal: Soft. Bowel sounds are normal.  Musculoskeletal: Normal range of motion.  Neurological: He is alert.  Skin:       1+ edema; shiny neuropathic changes (B)     Cannot feel distal pulses on (R) Neuropathic cahgnes Sluggish refill         Assessment & Plan:   1. Peripheral neuropathy  Comprehensive metabolic panel, Lipid panel, POCT glycosylated hemoglobin (Hb A1C), TSH, Vitamin B12  2. PVD (peripheral vascular disease)  Ambulatory referral to Cardiology  3. LBP (low back pain)    4. Abdominal aneurysm without mention of rupture    5. HTN (hypertension)    6. Hyperlipidemia    7. Stroke    8. History of ETOH abuse    9. History of tobacco abuse     Anticipatory guidance Patient to follow up after Cardiology visit for arterial dopplers.

## 2011-08-24 ENCOUNTER — Encounter: Payer: Self-pay | Admitting: Family Medicine

## 2011-08-24 ENCOUNTER — Ambulatory Visit (INDEPENDENT_AMBULATORY_CARE_PROVIDER_SITE_OTHER): Payer: Medicare Other | Admitting: Family Medicine

## 2011-08-24 VITALS — BP 141/94 | HR 57 | Temp 98.3°F | Resp 18 | Ht 66.0 in | Wt 184.8 lb

## 2011-08-24 DIAGNOSIS — Z139 Encounter for screening, unspecified: Secondary | ICD-10-CM | POA: Insufficient documentation

## 2011-08-24 DIAGNOSIS — Z8673 Personal history of transient ischemic attack (TIA), and cerebral infarction without residual deficits: Secondary | ICD-10-CM | POA: Insufficient documentation

## 2011-08-24 DIAGNOSIS — I70219 Atherosclerosis of native arteries of extremities with intermittent claudication, unspecified extremity: Secondary | ICD-10-CM

## 2011-08-24 DIAGNOSIS — I10 Essential (primary) hypertension: Secondary | ICD-10-CM

## 2011-08-24 DIAGNOSIS — I119 Hypertensive heart disease without heart failure: Secondary | ICD-10-CM | POA: Insufficient documentation

## 2011-08-24 DIAGNOSIS — M519 Unspecified thoracic, thoracolumbar and lumbosacral intervertebral disc disorder: Secondary | ICD-10-CM | POA: Insufficient documentation

## 2011-08-24 DIAGNOSIS — I639 Cerebral infarction, unspecified: Secondary | ICD-10-CM

## 2011-08-24 DIAGNOSIS — K219 Gastro-esophageal reflux disease without esophagitis: Secondary | ICD-10-CM | POA: Insufficient documentation

## 2011-08-24 DIAGNOSIS — I7090 Unspecified atherosclerosis: Secondary | ICD-10-CM

## 2011-08-24 DIAGNOSIS — M545 Low back pain: Secondary | ICD-10-CM

## 2011-08-24 DIAGNOSIS — J449 Chronic obstructive pulmonary disease, unspecified: Secondary | ICD-10-CM | POA: Insufficient documentation

## 2011-08-24 DIAGNOSIS — Z9889 Other specified postprocedural states: Secondary | ICD-10-CM

## 2011-08-24 DIAGNOSIS — E785 Hyperlipidemia, unspecified: Secondary | ICD-10-CM

## 2011-08-24 DIAGNOSIS — I2510 Atherosclerotic heart disease of native coronary artery without angina pectoris: Secondary | ICD-10-CM | POA: Insufficient documentation

## 2011-08-24 DIAGNOSIS — Z8679 Personal history of other diseases of the circulatory system: Secondary | ICD-10-CM | POA: Insufficient documentation

## 2011-08-24 MED ORDER — HYDROCODONE-ACETAMINOPHEN 5-500 MG PO TABS: 1.0000 | ORAL_TABLET | Freq: Four times a day (QID) | ORAL | Status: AC | PRN

## 2011-08-24 NOTE — Progress Notes (Signed)
  Subjective:    Patient ID: Chris Payne, male    DOB: 1951-04-11, 61 y.o.   MRN: [id number]  HPI  Patient presents in follow up of multiple medcal issues  Ambulation limited to 1 block as patient develops significant cramping in his (L) > (R) leg that resolves with rest. Significant history of atherosclerotic disease. Has not seen cardiology recently.  Chronic LBP- known discogenic disease; lumbar artery bleed at time of emergent repair of AAA.                        Persistant pain.  Using walking for stability.  Compliant with medications; no side effects.  LE edema; neuropathic pain much improved  SH/ quit smoking 1 year ago; however living with step son who is a heavy smoker and drinker.  Review of Systems    Objective:   Physical Exam  Constitutional: He appears well-developed.  HENT:  Head: Normocephalic and atraumatic.  Neck: Neck supple. Carotid bruit is not present. No thyromegaly present.  Cardiovascular: Normal rate, regular rhythm and normal heart sounds.  Exam reveals decreased pulses (unable to palpate DP pulse (L) foot; sluggish refill).   Pulmonary/Chest: Effort normal and breath sounds normal.  Neurological: He is alert. Sensory deficit: neuropathic changes (B) feet.        Assessment & Plan:   1. LBP (low back pain)  HYDROcodone-acetaminophen (VICODIN) 5-500 MG per tablet, Ambulatory referral to Physical Medicine Rehab   Degenerative disc disease Perioperative lumbar artery bleed 6/11  2. Atherosclerotic PVD with intermittent claudication  Ambulatory referral to Cardiology  3. HTN (hypertension)    4. Stroke     (L) LE hemiplegia  5. S/P AAA repair     complicated by perioperative pontine stroke  6. CAD (coronary artery disease)     CABG X 4; 2/12  7. Dyslipidemia    8. Screening colonoscopy  Ambulatory referral to Gastroenterology; once cleared by cardiologoy  9. COPD (chronic obstructive pulmonary disease)     quit smoking 6/11  10. GERD  (gastroesophageal reflux disease)      Continue current medications; follow up 3 weeks

## 2011-09-29 ENCOUNTER — Other Ambulatory Visit: Payer: Self-pay | Admitting: Family Medicine

## 2011-10-17 ENCOUNTER — Encounter (HOSPITAL_COMMUNITY): Payer: Self-pay | Admitting: Emergency Medicine

## 2011-10-17 ENCOUNTER — Emergency Department (HOSPITAL_COMMUNITY)
Admission: EM | Admit: 2011-10-17 | Discharge: 2011-10-17 | Disposition: A | Discharge status: Home / Self Care | Payer: Medicare Other | Attending: Emergency Medicine | Admitting: Emergency Medicine

## 2011-10-17 DIAGNOSIS — I10 Essential (primary) hypertension: Secondary | ICD-10-CM | POA: Insufficient documentation

## 2011-10-17 DIAGNOSIS — E785 Hyperlipidemia, unspecified: Secondary | ICD-10-CM | POA: Insufficient documentation

## 2011-10-17 DIAGNOSIS — Z8673 Personal history of transient ischemic attack (TIA), and cerebral infarction without residual deficits: Secondary | ICD-10-CM | POA: Insufficient documentation

## 2011-10-17 DIAGNOSIS — I714 Abdominal aortic aneurysm, without rupture, unspecified: Secondary | ICD-10-CM | POA: Insufficient documentation

## 2011-10-17 DIAGNOSIS — I509 Heart failure, unspecified: Secondary | ICD-10-CM | POA: Insufficient documentation

## 2011-10-17 DIAGNOSIS — B372 Candidiasis of skin and nail: Secondary | ICD-10-CM | POA: Insufficient documentation

## 2011-10-17 DIAGNOSIS — I2510 Atherosclerotic heart disease of native coronary artery without angina pectoris: Secondary | ICD-10-CM | POA: Insufficient documentation

## 2011-10-17 DIAGNOSIS — Z87891 Personal history of nicotine dependence: Secondary | ICD-10-CM | POA: Insufficient documentation

## 2011-10-17 DIAGNOSIS — Z7982 Long term (current) use of aspirin: Secondary | ICD-10-CM | POA: Insufficient documentation

## 2011-10-17 DIAGNOSIS — M7989 Other specified soft tissue disorders: Secondary | ICD-10-CM

## 2011-10-17 DIAGNOSIS — R609 Edema, unspecified: Secondary | ICD-10-CM | POA: Insufficient documentation

## 2011-10-17 DIAGNOSIS — Z8739 Personal history of other diseases of the musculoskeletal system and connective tissue: Secondary | ICD-10-CM | POA: Insufficient documentation

## 2011-10-17 LAB — COMPREHENSIVE METABOLIC PANEL
ALT: 11 U/L (ref 0–53)
AST: 20 U/L (ref 0–37)
Albumin: 3.7 g/dL (ref 3.5–5.2)
CO2: 26 mEq/L (ref 19–32)
Calcium: 9.6 mg/dL (ref 8.4–10.5)
Chloride: 101 mEq/L (ref 96–112)
Creatinine, Ser: 0.91 mg/dL (ref 0.50–1.35)
Sodium: 138 mEq/L (ref 135–145)

## 2011-10-17 LAB — CBC
HCT: 48.1 % (ref 39.0–52.0)
MCHC: 34.3 g/dL (ref 30.0–36.0)
Platelets: 234 10*3/uL (ref 150–400)
RDW: 12.2 % (ref 11.5–15.5)
WBC: 6.9 10*3/uL (ref 4.0–10.5)

## 2011-10-17 LAB — PROTIME-INR
INR: 1.02 (ref 0.00–1.49)
Prothrombin Time: 13.6 seconds (ref 11.6–15.2)

## 2011-10-17 LAB — DIFFERENTIAL
Basophils Absolute: 0 10*3/uL (ref 0.0–0.1)
Basophils Relative: 0 % (ref 0–1)
Eosinophils Relative: 2 % (ref 0–5)
Lymphocytes Relative: 34 % (ref 12–46)
Monocytes Absolute: 0.5 10*3/uL (ref 0.1–1.0)
Neutro Abs: 3.9 10*3/uL (ref 1.7–7.7)

## 2011-10-17 LAB — URINALYSIS, ROUTINE W REFLEX MICROSCOPIC
Bilirubin Urine: NEGATIVE
Glucose, UA: NEGATIVE mg/dL
Hgb urine dipstick: NEGATIVE
Specific Gravity, Urine: 1.01 (ref 1.005–1.030)
pH: 7.5 (ref 5.0–8.0)

## 2011-10-17 LAB — PRO B NATRIURETIC PEPTIDE: Pro B Natriuretic peptide (BNP): 131.2 pg/mL — ABNORMAL HIGH (ref 0–125)

## 2011-10-17 LAB — APTT: aPTT: 34 seconds (ref 24–37)

## 2011-10-17 MED ORDER — FLUCONAZOLE 100 MG PO TABS: 100.0000 mg | ORAL_TABLET | Freq: Every day | ORAL | Status: AC

## 2011-10-17 MED ORDER — FLUCONAZOLE 200 MG PO TABS
200.0000 mg | ORAL_TABLET | Freq: Once | ORAL | Status: AC
  Administered 2011-10-17: 200 mg via ORAL
  Filled 2011-10-17: qty 1

## 2011-10-17 NOTE — Progress Notes (Signed)
Bilateral lower extremity venous duplex completed.  Preliminary report is negative for DVT, SVT, or a Baker's cyst. 

## 2011-10-17 NOTE — ED Provider Notes (Signed)
History     CSN: [id number]  Arrival date & time 10/17/11  0830   First MD Initiated Contact with Patient 10/17/11 0840      Chief Complaint  Patient presents with  . Rash    B/L groin area    (Consider location/radiation/quality/duration/timing/severity/associated sxs/prior treatment) HPI Patient presents emergency department with a, rash that is in his bilateral inguinal region extends down to the scrotal area and upper, inner thighs.  The patient states areas have been there for about 3 months.  He states that he was seen at urgent care and given nystatin cream, which does not seem to be offering any relief.  Patient denies nausea, vomiting, fever, dysuria, weakness, scrotal swelling, or diarrhea. The patient has tried no other treatments for this condition and has not followed up with his PCP. Past Medical History  Diagnosis Date  . Hypertension   . Stroke   . Arthritis   . Leg pain     with walking  . Chest pain   . Weight loss   . AAA (abdominal aortic aneurysm)   . Hyperlipidemia   . CAD (coronary artery disease)     Past Surgical History  Procedure Date  . Hip surgery 40 years ago    left hip removal and pinning  . Abdominal aortic aneurysm repair 10-03-2009  . Exploratory laparotomy 09/2009    ligation of lumbar arteries  . Abdominal aortic aneurysm repair     Family History  Problem Relation Age of Onset  . Coronary artery disease Other   . Alcohol abuse Father   . Cancer Sister   . Heart disease Sister     History  Substance Use Topics  . Smoking status: Former Smoker -- 1.0 packs/day    Types: Cigarettes    Quit date: 01/25/2010  . Smokeless tobacco: Not on file  . Alcohol Use: No     former drinker      Review of Systems All other systems negative except as documented in the HPI. All pertinent positives and negatives as reviewed in the HPI.  Allergies  Review of patient's allergies indicates no known allergies.  Home Medications    Current Outpatient Rx  Name Route Sig Dispense Refill  . ASPIRIN EC 81 MG PO TBEC Oral Take 81 mg by mouth daily.    Marland Kitchen DOCUSATE SODIUM 100 MG PO CAPS Oral Take 100 mg by mouth daily as needed. For constipation.    Marland Kitchen ESOMEPRAZOLE MAGNESIUM 40 MG PO CPDR Oral Take 40 mg by mouth daily before breakfast.    . FENOFIBRATE 54 MG PO TABS Oral Take 54 mg by mouth daily.    Marland Kitchen GABAPENTIN 100 MG PO CAPS Oral Take 300 mg by mouth at bedtime.    Marland Kitchen HYDROCODONE-ACETAMINOPHEN 5-500 MG PO TABS Oral Take 1 tablet by mouth every 6 (six) hours as needed. For pain.    Marland Kitchen LISINOPRIL 10 MG PO TABS Oral Take 1 tablet (10 mg total) by mouth daily. 30 tablet 3  . METOPROLOL SUCCINATE ER 25 MG PO TB24 Oral Take 25 mg by mouth daily.    . NYSTATIN 100000 UNIT/GM EX OINT Topical Apply 1 application topically 3 (three) times daily. Apply to both sides of groin.    Marland Kitchen SIMVASTATIN 40 MG PO TABS Oral Take 40 mg by mouth at bedtime.      BP 134/78  Pulse 66  Temp 97.8 F (36.6 C) (Oral)  Resp 18  SpO2 99%  Physical Exam  Constitutional: He is oriented to person, place, and time. He appears well-developed and well-nourished. No distress.  Cardiovascular: Normal rate.   Pulmonary/Chest: Effort normal and breath sounds normal. No respiratory distress.  Musculoskeletal: He exhibits edema.  Neurological: He is alert and oriented to person, place, and time.  Skin:       ED Course  Procedures (including critical care time)  Labs Reviewed  COMPREHENSIVE METABOLIC PANEL - Abnormal; Notable for the following:    GFR calc non Af Amer 89 (*)     All other components within normal limits  PRO B NATRIURETIC PEPTIDE - Abnormal; Notable for the following:    Pro B Natriuretic peptide (BNP) 131.2 (*)     All other components within normal limits  CBC  DIFFERENTIAL  URINALYSIS, ROUTINE W REFLEX MICROSCOPIC  PROTIME-INR  APTT   Patient be treated with Diflucan 100 mg daily for 7 days.  Patient is advised to follow his  primary care Dr. for recheck.  Told to return here for any worsening in his condition.  Patient has, what appears to be a candidal type infection vs. Fungal.   MDM         Carlyle Dolly, PA-C 10/17/11 1248

## 2011-10-17 NOTE — ED Notes (Signed)
Pt reports red rash on B/L groin sides x 3 months. Pt has been using Nystatin prescribed by another hospital without relief.

## 2011-10-17 NOTE — Discharge Instructions (Signed)
Followup with your primary care Dr. for recheck.  Return here as needed for any worsening in your condition

## 2011-10-17 NOTE — ED Provider Notes (Signed)
Medical screening examination/treatment/procedure(s) were conducted as a shared visit with non-physician practitioner(s) and myself.  I personally evaluated the patient during the encounter 61 year old man with tinea cruris resistant to topical antifungals.  He has had prior AAA repair, prior CABG, complicated by CVA, not known to be diabetic.  He had bilateral ankle edema on exam in addition to his rash.  Venous dopplers negative.  Rx po diflucan for one week, followup [facility] Hal [facility].  Carleene Cooper III, MD 10/17/11 1728

## 2011-10-20 ENCOUNTER — Other Ambulatory Visit: Payer: Self-pay | Admitting: Physician Assistant

## 2011-10-21 ENCOUNTER — Other Ambulatory Visit: Payer: Self-pay | Admitting: Family Medicine

## 2011-10-23 ENCOUNTER — Ambulatory Visit (INDEPENDENT_AMBULATORY_CARE_PROVIDER_SITE_OTHER): Payer: Medicare Other | Admitting: Cardiology

## 2011-10-23 ENCOUNTER — Encounter: Payer: Self-pay | Admitting: Cardiology

## 2011-10-23 VITALS — BP 147/98 | HR 73 | Ht 68.0 in | Wt 182.8 lb

## 2011-10-23 DIAGNOSIS — R609 Edema, unspecified: Secondary | ICD-10-CM | POA: Insufficient documentation

## 2011-10-23 DIAGNOSIS — I2510 Atherosclerotic heart disease of native coronary artery without angina pectoris: Secondary | ICD-10-CM

## 2011-10-23 DIAGNOSIS — E785 Hyperlipidemia, unspecified: Secondary | ICD-10-CM

## 2011-10-23 DIAGNOSIS — I10 Essential (primary) hypertension: Secondary | ICD-10-CM

## 2011-10-23 MED ORDER — FUROSEMIDE 20 MG PO TABS: 20.0000 mg | ORAL_TABLET | Freq: Every day | ORAL | Status: DC

## 2011-10-23 NOTE — Progress Notes (Signed)
HPI The patient presents as a new patient for followup of vascular disease. He's had an abdominal aortic aneurysm requiring surgical repair. 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 Not long after this he had chest discomfort and was found to have three-vessel coronary disease and was status post CABG. He was followed by another cardiology group is changing care to here. He doesn't really recall the chest discomfort he had before but he denies any currently. He gets around with a walker being unsteady on his gait and also with leg length discrepancy since hip surgery many years ago. He looks somewhat frail and has limited activities. He does not describe chest pressure, neck or arm discomfort. He doesn't describe palpitations, presyncope or syncope. He's not having PND or orthopnea. He does have a cough productive of thick yellow sputum but has had no fevers or chills. He has bilateral lower extremity swelling which has been somewhat chronic. He says he does his feet elevated.  No Known Allergies  Current Outpatient Prescriptions  Medication Sig Dispense Refill  . aspirin EC 81 MG tablet Take 81 mg by mouth daily.      Marland Kitchen docusate sodium (COLACE) 100 MG capsule Take 100 mg by mouth daily as needed. For constipation.      Marland Kitchen esomeprazole (NEXIUM) 40 MG capsule Take 40 mg by mouth daily before breakfast.      . fenofibrate 54 MG tablet Take 54 mg by mouth daily.      . fluconazole (DIFLUCAN) 100 MG tablet Take 1 tablet (100 mg total) by mouth daily.  7 tablet  0  . gabapentin (NEURONTIN) 100 MG capsule Take 300 mg by mouth at bedtime.      Marland Kitchen HYDROcodone-acetaminophen (VICODIN) 5-500 MG per tablet TAKE ONE TABLET BY MOUTH EVERY 6 HOURS AS NEEDED FOR PAIN  20 tablet  0  . metoprolol succinate (TOPROL-XL) 25 MG 24 hr tablet Take 25 mg by mouth daily.      Marland Kitchen nystatin ointment (MYCOSTATIN) Apply 1 application topically 3 (three) times daily. Apply to both sides of groin.      .  simvastatin (ZOCOR) 40 MG tablet Take 40 mg by mouth at bedtime.      Marland Kitchen lisinopril (PRINIVIL,ZESTRIL) 10 MG tablet Take 1 tablet (10 mg total) by mouth daily.  30 tablet  3    Past Medical History  Diagnosis Date  . Hypertension   . Stroke   . Arthritis   . Leg pain     with walking  . Chest pain   . Weight loss   . AAA (abdominal aortic aneurysm)   . Hyperlipidemia   . CAD (coronary artery disease)     Past Surgical History  Procedure Date  . Hip surgery 40 years ago    left hip removal and pinning  . Abdominal aortic aneurysm repair 10-03-2009  . Exploratory laparotomy 09/2009    ligation of lumbar arteries  . Abdominal aortic aneurysm repair     Family History  Problem Relation Age of Onset  . Coronary artery disease Other   . Alcohol abuse Father   . Cancer Sister   . Heart disease Sister     History   Social History  . Marital Status: Single    Spouse Name: N/A    Number of Children: N/A  . Years of Education: N/A   Occupational History  . Not on file.   Social History Main Topics  . Smoking status:  Former Smoker -- 1.0 packs/day    Types: Cigarettes    Quit date: 01/25/2010  . Smokeless tobacco: Not on file  . Alcohol Use: No     former drinker  . Drug Use: No  . Sexually Active: Not on file   Other Topics Concern  . Not on file   Social History Narrative  . No narrative on file    ROS:  Positive for constipation, leg cramping. Otherwise as stated in the HPI and negative for all other systems.  PHYSICAL EXAM BP 147/98  Pulse 73  Ht 5\' 8"  (1.727 m)  Wt 182 lb 12.8 oz (82.918 kg)  BMI 27.79 kg/m2 GENERAL: Looks older than stated age HEENT:  Pupils equal round and reactive, fundi not visualized, oral mucosa unremarkable, dentures NECK:  No jugular venous distention, waveform within normal limits, carotid upstroke brisk and symmetric, no bruits, no thyromegaly LYMPHATICS:  No cervical, inguinal adenopathy LUNGS:  Clear to auscultation  bilaterally BACK:  No CVA tenderness CHEST:  Well healed sternotomy scar. HEART:  PMI not displaced or sustained,S1 and S2 within normal limits, no S3, no S4, no clicks, no rubs, no murmurs ABD:  Flat, positive bowel sounds normal in frequency in pitch, no bruits, no rebound, no guarding, no midline pulsatile mass, no hepatomegaly, no splenomegaly, abdominal scar. EXT:  2 plus pulses upper, diminished bilateral lower, moderate bilateral lower extremity edema, no cyanosis no clubbing, bilateral bruits, left leg is shorter than right  SKIN:  No rashes no nodules NEURO:  Cranial nerves II through XII grossly intact, motor grossly intact throughout PSYCH:  Cognitively intact, oriented to person place and time   EKG:  Sinus rhythm, rate 73, axis within normal limits, intervals within normal limits, nonspecific anterior T-wave flattening, poor anterior R wave progression, no acute ST-T wave changes. 10/23/2011  ASSESSMENT AND PLAN

## 2011-10-23 NOTE — Patient Instructions (Addendum)
Please start Lasix (Furosemide) 20 mg a day Continue all other medications as listed  Please wear compression stockings daily and remove at night.  Follow up with PA in 2 months.

## 2011-10-23 NOTE — Assessment & Plan Note (Signed)
This is actually his predominant complaint. I'm going to give him some knee-high compression stockings. We talked about keeping his feet elevated. I'm going to give him Lasix 20 mg daily. His renal function and potassium were recently normal. I will check a basic metabolic profile again in about one week.

## 2011-10-23 NOTE — Assessment & Plan Note (Signed)
He is on combination therapy with an LDL of 109 HDL 38. I will defer to [facility] MD with a goal LDL less than 100 and HDL greater than 40.

## 2011-10-23 NOTE — Assessment & Plan Note (Signed)
The patient has no new sypmtoms.  No further cardiovascular testing is indicated.  We will continue with aggressive risk reduction and meds as listed.  

## 2011-10-23 NOTE — Assessment & Plan Note (Signed)
The blood pressure is at target. No change in medications is indicated. We will continue with therapeutic lifestyle changes (TLC).  

## 2011-11-03 ENCOUNTER — Other Ambulatory Visit: Payer: Self-pay | Admitting: Physician Assistant

## 2011-11-04 ENCOUNTER — Other Ambulatory Visit: Payer: Self-pay | Admitting: *Deleted

## 2011-11-16 ENCOUNTER — Other Ambulatory Visit: Payer: Self-pay | Admitting: Family Medicine

## 2011-11-17 ENCOUNTER — Other Ambulatory Visit: Payer: Self-pay | Admitting: *Deleted

## 2011-11-27 ENCOUNTER — Ambulatory Visit (INDEPENDENT_AMBULATORY_CARE_PROVIDER_SITE_OTHER): Payer: Medicare Other | Admitting: Family Medicine

## 2011-11-27 ENCOUNTER — Encounter: Payer: Self-pay | Admitting: Family Medicine

## 2011-11-27 VITALS — BP 134/82 | HR 71 | Temp 98.7°F | Resp 16 | Ht 66.25 in | Wt 185.6 lb

## 2011-11-27 DIAGNOSIS — G629 Polyneuropathy, unspecified: Secondary | ICD-10-CM

## 2011-11-27 DIAGNOSIS — I10 Essential (primary) hypertension: Secondary | ICD-10-CM

## 2011-11-27 DIAGNOSIS — G609 Hereditary and idiopathic neuropathy, unspecified: Secondary | ICD-10-CM

## 2011-11-27 DIAGNOSIS — Z23 Encounter for immunization: Secondary | ICD-10-CM

## 2011-11-27 DIAGNOSIS — I2510 Atherosclerotic heart disease of native coronary artery without angina pectoris: Secondary | ICD-10-CM

## 2011-11-27 MED ORDER — SIMVASTATIN 40 MG PO TABS: 40.0000 mg | ORAL_TABLET | Freq: Every day | ORAL | Status: DC

## 2011-11-27 MED ORDER — GABAPENTIN 300 MG PO CAPS: ORAL_CAPSULE | ORAL | Status: DC

## 2011-11-27 MED ORDER — METOPROLOL SUCCINATE ER 25 MG PO TB24: 25.0000 mg | ORAL_TABLET | Freq: Every day | ORAL | Status: DC

## 2011-11-27 MED ORDER — FENOFIBRATE 54 MG PO TABS: 54.0000 mg | ORAL_TABLET | Freq: Every day | ORAL | Status: DC

## 2011-11-27 NOTE — Patient Instructions (Addendum)
Contact [facility] to ask amount compression stockings and if they are covered by Medicare.

## 2011-11-30 ENCOUNTER — Other Ambulatory Visit: Payer: Self-pay | Admitting: *Deleted

## 2011-11-30 MED ORDER — HYDROCODONE-ACETAMINOPHEN 5-500 MG PO TABS: 1.0000 | ORAL_TABLET | Freq: Four times a day (QID) | ORAL | Status: DC | PRN

## 2011-11-30 NOTE — Telephone Encounter (Signed)
Patient notified

## 2011-11-30 NOTE — Telephone Encounter (Signed)
Pt was seen on 8/2 and was suppose to get a refill on his pain med hydrocodone.  Can we refill this for him?

## 2011-11-30 NOTE — Telephone Encounter (Signed)
We talked at length about his pain issue but he did not ask for refills for Hydrocodone-APAP. I will call this med to his pharmacy. I called in: Hydrocodone-APAP 5-500 mg   #30  with 2 RFs to [facility].

## 2011-11-30 NOTE — Telephone Encounter (Signed)
Dr. Audria Nine- This was refilled by phone 7/09 and he was advised he needed an OV for additional refills.  I see that you saw him 8/02, but I don't see that his need for pain medication was addressed.  Please advise.

## 2011-12-02 ENCOUNTER — Encounter: Payer: Self-pay | Admitting: Family Medicine

## 2011-12-02 NOTE — Progress Notes (Signed)
  Subjective:    Patient ID: Chris Payne, male    DOB: 1951-01-07, 61 y.o.   MRN: [id number]  HPI  This 61 y.o. Cauc male is here for medication review and to discuss ongoing chronic leg pain.  He has known Lumbar DDD and PAD with neuropathy of lower extremities- has been referred to  Cardiology for further work-up. He quit smoking almost 1year ago.  He has chronic LE edema with pain and difficulty ambulating. Dr. Hal Hope started Gabapentin in  April; pt not taking it because he says it was ineffective. He takes Hydrocodone-APAP as directed  for pain.    Review of Systems  Constitutional: Positive for activity change, appetite change and fatigue. Negative for fever and unexpected weight change.  Respiratory: Positive for shortness of breath. Negative for cough and chest tightness.   Cardiovascular: Positive for leg swelling. Negative for chest pain and palpitations.  Gastrointestinal: Negative for abdominal pain and abdominal distention.  Genitourinary: Negative for flank pain, decreased urine volume and difficulty urinating.  Musculoskeletal: Positive for back pain, joint swelling and gait problem.  Skin: Positive for color change.  Neurological: Positive for weakness and numbness. Negative for dizziness and headaches.  Psychiatric/Behavioral: Positive for disturbed wake/sleep cycle and dysphoric mood. Negative for suicidal ideas and confusion. The patient is not nervous/anxious.        Objective:   Physical Exam  Nursing note and vitals reviewed. Constitutional: He is oriented to person, place, and time. He appears well-developed and well-nourished. No distress.       Appears chronically ill  HENT:  Head: Normocephalic and atraumatic.  Eyes: Conjunctivae and EOM are normal. No scleral icterus.  Cardiovascular: Normal rate, regular rhythm and normal heart sounds.  Exam reveals decreased pulses. Exam reveals no gallop.   No murmur heard. Pulses:      Popliteal pulses are 0 on the  right side, and 0 on the left side.       Dorsalis pedis pulses are 0 on the right side, and 0 on the left side.       Posterior tibial pulses are 0 on the right side, and 0 on the left side.  Pulmonary/Chest: Effort normal. No respiratory distress.  Musculoskeletal: He exhibits edema.       Decreased ROM in knee and ankle joints  Neurological: He is alert and oriented to person, place, and time. No cranial nerve deficit.       Gait is slow and difficult; has L-sided hemiplegia; pt uses walker   Skin: Skin is warm and dry. There is erythema.       Lower ext- brawny edema with trace pitting  Psychiatric:       Flat affect and slightly depressed mood         Assessment & Plan:   1. HTN (hypertension) -continue current medications; Metoprolol refilled. Pt has refills on Lisinopril  2. CAD (coronary artery disease) -follow-up with specialist as scheduled  3. Peripheral neuropathy -restart Gabapentin at 300 mg  1 capsule hs  X 1 week then 2 caps hs until follow-up.

## 2011-12-23 ENCOUNTER — Ambulatory Visit: Payer: Medicare Other | Admitting: Physician Assistant

## 2011-12-23 ENCOUNTER — Encounter: Payer: Self-pay | Admitting: Physician Assistant

## 2011-12-23 DIAGNOSIS — Z1211 Encounter for screening for malignant neoplasm of colon: Secondary | ICD-10-CM

## 2012-01-11 ENCOUNTER — Encounter: Payer: Self-pay | Admitting: Physician Assistant

## 2012-01-12 ENCOUNTER — Other Ambulatory Visit: Payer: Self-pay | Admitting: Physician Assistant

## 2012-01-12 ENCOUNTER — Ambulatory Visit (INDEPENDENT_AMBULATORY_CARE_PROVIDER_SITE_OTHER)
Admission: RE | Admit: 2012-01-12 | Discharge: 2012-01-12 | Disposition: A | Discharge status: Home / Self Care | Payer: Medicare Other | Source: Ambulatory Visit | Attending: Physician Assistant | Admitting: Physician Assistant

## 2012-01-12 ENCOUNTER — Ambulatory Visit (INDEPENDENT_AMBULATORY_CARE_PROVIDER_SITE_OTHER): Payer: Medicare Other | Admitting: Physician Assistant

## 2012-01-12 ENCOUNTER — Encounter: Payer: Self-pay | Admitting: Physician Assistant

## 2012-01-12 VITALS — BP 126/90 | HR 63 | Ht 67.5 in | Wt 181.4 lb

## 2012-01-12 DIAGNOSIS — I6529 Occlusion and stenosis of unspecified carotid artery: Secondary | ICD-10-CM

## 2012-01-12 DIAGNOSIS — M79609 Pain in unspecified limb: Secondary | ICD-10-CM

## 2012-01-12 DIAGNOSIS — R05 Cough: Secondary | ICD-10-CM

## 2012-01-12 DIAGNOSIS — I2510 Atherosclerotic heart disease of native coronary artery without angina pectoris: Secondary | ICD-10-CM

## 2012-01-12 DIAGNOSIS — M79606 Pain in leg, unspecified: Secondary | ICD-10-CM

## 2012-01-12 DIAGNOSIS — R079 Chest pain, unspecified: Secondary | ICD-10-CM

## 2012-01-12 DIAGNOSIS — I10 Essential (primary) hypertension: Secondary | ICD-10-CM

## 2012-01-12 DIAGNOSIS — R059 Cough, unspecified: Secondary | ICD-10-CM

## 2012-01-12 LAB — BASIC METABOLIC PANEL
Calcium: 9 mg/dL (ref 8.4–10.5)
GFR: 79.39 mL/min (ref >60.00)
Glucose, Bld: 91 mg/dL (ref 70–99)
Potassium: 4.1 mEq/L (ref 3.5–5.1)
Sodium: 138 mEq/L (ref 135–145)

## 2012-01-12 MED ORDER — LISINOPRIL 10 MG PO TABS: 20.0000 mg | ORAL_TABLET | Freq: Every day | ORAL | Status: DC

## 2012-01-12 MED ORDER — NITROGLYCERIN 0.4 MG SL SUBL: 0.4000 mg | SUBLINGUAL_TABLET | SUBLINGUAL | Status: DC | PRN

## 2012-01-12 NOTE — Patient Instructions (Addendum)
Your physician has recommended you make the following change in your medication: INCREASE LISINPRIL (20MG ) DAILY, START NITROGLYCERIN AS NEEDED, WAS CALLED INTO YOUR PHARMACY Your physician recommends that you return for lab work in: ONE WEEK FOR A BMET AND HAVE A BMET TODAY  Your physician has requested that you have a lexiscan myoview. For further information please visit [email or web address] Please follow instruction sheet, as given.  A chest x-ray takes a picture of the organs and structures inside the chest, including the heart, lungs, and blood vessels. This test can show several things, including, whether the heart is enlarges; whether fluid is building up in the lungs; and whether pacemaker / defibrillator leads are still in place. DX: COUGH  Your physician recommends that you schedule a follow-up appointment in: ONE  MONTH WITH SCOTT WEAVER, PA-C WHEN DR. HOCHRIEN IS IN THE OFFICE

## 2012-01-12 NOTE — Addendum Note (Signed)
Addended by: Brien Mates R on: 01/12/2012 12:05 PM   Modules accepted: Orders

## 2012-01-12 NOTE — Progress Notes (Signed)
[address] [place], [place]  [address] Phone: [phone] Fax:  [phone]  Date:  01/12/2012   Name:  Chris Payne   DOB:  Dec 17, 1950   MRN:  [id number]  PCP:  Dois Davenport., MD  Primary Cardiologist:  Dr. Rollene Rotunda  Primary Electrophysiologist:  None    History of Present Illness: Chris Payne is a 61 y.o. male who returns for followup.  He has a history of CAD, status post CABG in 05/2010, abdominal aortic aneurysm, status post AAA repair 6/11, prior stroke, carotid stenosis, PAD, HTN, HL, COPD.  Echo 05/2010: EF 45-50%, basal-mid inferolateral and inferior HK, grade 1 diastolic dysfunction to he recently established with Dr. Antoine Poche. He was noted to have LE edema. He was placed on Lasix and asked to obtain compression stockings.  His edema is improved. He notes bilateral leg burning. This seems to occur at rest. He denies exertional symptoms. It is not clear to me when his last ABIs were obtained. He a significant history of low back pain. He does note chest tightness. This occurs at rest. He has noted it for the last 2 months. It is nonexertional. However, he is fairly sedentary. The pain lasts about 1-2 minutes. He denies orthopnea, PND or edema. He denies significant dyspnea. He notes a chronic cough. This is productive of yellow sputum. He denies mod assist.  Wt Readings from Last 3 Encounters:  11/27/11 185 lb 9.6 oz (84.188 kg)  10/23/11 182 lb 12.8 oz (82.918 kg)  08/24/11 184 lb 12.8 oz (83.825 kg)     Past Medical History  Diagnosis Date  . Hypertension   . Stroke     h/o pontine CVA  . Arthritis   . Leg pain     with walking  . Weight loss   . AAA (abdominal aortic aneurysm)     s/p repair 6/11  . Hyperlipidemia   . CAD (coronary artery disease)     s/p CABG 2/12:  LIMA to LAD, SVG to diagonal-1, SVG to ramus intermedius,, SVG to AM (Dr. Dorris Fetch)   . Carotid stenosis     Current Outpatient Prescriptions  Medication  Sig Dispense Refill  . aspirin EC 81 MG tablet Take 81 mg by mouth daily.      Marland Kitchen docusate sodium (COLACE) 100 MG capsule Take 100 mg by mouth daily as needed. For constipation.      Marland Kitchen esomeprazole (NEXIUM) 40 MG capsule Take 40 mg by mouth daily before breakfast.      . fenofibrate 54 MG tablet Take 1 tablet (54 mg total) by mouth daily.  30 tablet  5  . furosemide (LASIX) 20 MG tablet Take 1 tablet (20 mg total) by mouth daily.  30 tablet  11  . gabapentin (NEURONTIN) 300 MG capsule Take 1 capsule at bedtime for 1 week then 2 capsules at bedtime or as directed.  90 capsule  3  . HYDROcodone-acetaminophen (VICODIN) 5-500 MG per tablet Take 1 tablet by mouth every 6 (six) hours as needed for pain. Needs office visit (second notice)  30 tablet  2  . lisinopril (PRINIVIL,ZESTRIL) 10 MG tablet Take 1 tablet (10 mg total) by mouth daily.  30 tablet  3  . metoprolol succinate (TOPROL-XL) 25 MG 24 hr tablet Take 1 tablet (25 mg total) by mouth daily.  30 tablet  5  . nystatin ointment (MYCOSTATIN) Apply 1 application topically 3 (three) times daily. Apply to both sides of groin.      Marland Kitchen  simvastatin (ZOCOR) 40 MG tablet Take 1 tablet (40 mg total) by mouth at bedtime.  30 tablet  5    Allergies: Not on File  History  Substance Use Topics  . Smoking status: Former Smoker -- 1.0 packs/day for 30 years    Types: Cigarettes    Quit date: 01/25/2010  . Smokeless tobacco: Not on file  . Alcohol Use: No     former drinker     ROS:  Please see the history of present illness.   He does complain of candidiasis in the groin.  All other systems reviewed and negative.   PHYSICAL EXAM: VS:  BP 126/90  Pulse 63  Ht 5' 7.5" (1.715 m)  Wt 181 lb 6.4 oz (82.283 kg)  BMI 27.99 kg/m2 Well nourished, well developed, in no acute distress HEENT: normal Neck: no JVD Vascular: No carotid bruits; DP/PT diminished bilaterally Cardiac:  normal S1, S2; RRR; no murmur Lungs:  Decreased breaths bilaterally, no  wheezing, rhonchi or rales Abd: soft, nontender, no hepatomegaly Ext: no edema Skin: warm and dry Neuro:  CNs 2-12 intact, no focal abnormalities noted  EKG:  Sinus rhythm, heart rate 63, normal axis, poor R wave progression, T-wave inversions in V1-V5      ASSESSMENT AND PLAN:  1. Chest Pain:  Somewhat atypical.  But, he has some changes on his ECG when compared to the last tracing.  He is not that active.  I will set him up for a Lexiscan Myoview and a CXR.  Follow up with me in one month or sooner if his myoview is abnormal.  2. Leg Pain:  He apparently has a hx of PAD.  I see no record of an ABI.  Will see if this was done recently at Dr. Hazle Coca office.  If not, get ABIs.  If ABIs are ok, suggest follow up with [facility], MD as symptoms are likely neuropathic.  3. Ischemic Cardiomyopathy:  EF mildly depressed in the past.  Continue beta blocker and ACE.  Will see what EF is on myoview.  4. Coronary Artery Disease:  Continue ASA and statin.  Proceed with myoview as noted.  5. Carotid Stenosis:  I do not see any recent dopplers.  Will see if done at Dr. Hazle Coca.  If not, get carotid dopplers.  6. Edema:  Improved.  Check bmet today.  7. Hypertension:  Increase Lisinopril to 20 mg QD.  Check bmet in one week.  8. Hyperlipidemia:  Managed by PCP.   9. Abdominal Aortic Aneurysm, s/p Repair:  Followed by [facility].  Signed, Tereso Newcomer, PA-C  10:06 AM 01/12/2012

## 2012-01-13 ENCOUNTER — Encounter: Payer: Self-pay | Admitting: Physician Assistant

## 2012-01-13 ENCOUNTER — Telehealth: Payer: Self-pay | Admitting: *Deleted

## 2012-01-13 NOTE — Telephone Encounter (Signed)
pt notified about cxr and lab results w/verbal understanding today.

## 2012-01-13 NOTE — Telephone Encounter (Signed)
Message copied by Tarri Fuller on Wed Jan 13, 2012 12:13 PM ------      Message from: [place], [place] T      Created: Tue Jan 12, 2012  5:07 PM       chest X-ray ok      Tereso Newcomer, [place]  5:07 PM 01/12/2012

## 2012-01-19 ENCOUNTER — Other Ambulatory Visit: Payer: Self-pay | Admitting: Family Medicine

## 2012-01-20 ENCOUNTER — Encounter (HOSPITAL_COMMUNITY): Payer: Medicare Other

## 2012-01-27 ENCOUNTER — Other Ambulatory Visit: Payer: Self-pay | Admitting: Radiology

## 2012-01-31 ENCOUNTER — Other Ambulatory Visit: Payer: Self-pay | Admitting: Family Medicine

## 2012-02-02 ENCOUNTER — Other Ambulatory Visit: Payer: Self-pay | Admitting: *Deleted

## 2012-02-02 ENCOUNTER — Ambulatory Visit (HOSPITAL_COMMUNITY): Payer: Medicare Other | Attending: Cardiology | Admitting: Radiology

## 2012-02-02 VITALS — BP 137/75 | HR 54 | Ht 67.5 in | Wt 180.0 lb

## 2012-02-02 DIAGNOSIS — R059 Cough, unspecified: Secondary | ICD-10-CM

## 2012-02-02 DIAGNOSIS — I10 Essential (primary) hypertension: Secondary | ICD-10-CM

## 2012-02-02 DIAGNOSIS — I739 Peripheral vascular disease, unspecified: Secondary | ICD-10-CM | POA: Insufficient documentation

## 2012-02-02 DIAGNOSIS — M79606 Pain in leg, unspecified: Secondary | ICD-10-CM

## 2012-02-02 DIAGNOSIS — R05 Cough: Secondary | ICD-10-CM

## 2012-02-02 DIAGNOSIS — I2510 Atherosclerotic heart disease of native coronary artery without angina pectoris: Secondary | ICD-10-CM

## 2012-02-02 DIAGNOSIS — R9431 Abnormal electrocardiogram [ECG] [EKG]: Secondary | ICD-10-CM

## 2012-02-02 DIAGNOSIS — R0789 Other chest pain: Secondary | ICD-10-CM | POA: Insufficient documentation

## 2012-02-02 DIAGNOSIS — R079 Chest pain, unspecified: Secondary | ICD-10-CM

## 2012-02-02 DIAGNOSIS — I6529 Occlusion and stenosis of unspecified carotid artery: Secondary | ICD-10-CM

## 2012-02-02 MED ORDER — METOPROLOL SUCCINATE ER 25 MG PO TB24: 25.0000 mg | ORAL_TABLET | Freq: Every day | ORAL | Status: DC

## 2012-02-02 MED ORDER — TECHNETIUM TC 99M SESTAMIBI GENERIC - CARDIOLITE
10.8000 | Freq: Once | INTRAVENOUS | Status: AC | PRN
  Administered 2012-02-02: 11 via INTRAVENOUS

## 2012-02-02 MED ORDER — LISINOPRIL 10 MG PO TABS: 20.0000 mg | ORAL_TABLET | Freq: Every day | ORAL | Status: DC

## 2012-02-02 MED ORDER — TECHNETIUM TC 99M SESTAMIBI GENERIC - CARDIOLITE
33.0000 | Freq: Once | INTRAVENOUS | Status: AC | PRN
  Administered 2012-02-02: 33 via INTRAVENOUS

## 2012-02-02 MED ORDER — REGADENOSON 0.4 MG/5ML IV SOLN
0.4000 mg | Freq: Once | INTRAVENOUS | Status: AC
  Administered 2012-02-02: 0.4 mg via INTRAVENOUS

## 2012-02-02 NOTE — Progress Notes (Signed)
[facility] SITE 3 NUCLEAR MED [id number] [place] [address] [phone]  Cardiology Nuclear Med Study  Chris Payne is a 61 y.o. male     MRN : [id number]     DOB: May 03, 1950  Procedure Date: 02/02/2012  Nuclear Med Background Indication for Stress Test:  Evaluation for Ischemia, Graft Patency and Abnormal EKG History:  '12 CABG; '12 Echo:EF=45-50% Cardiac Risk Factors: Carotid Disease, CVA, History of Smoking, Hypertension, Lipids and PVD  Symptoms:  Chest Tightness (last episode of chest discomfort was about one month ago)   Nuclear Pre-Procedure Caffeine/Decaff Intake:  7:30pm NPO After: 7:00pm   Lungs:  Clear. O2 Sat: 96% on room air. IV 0.9% NS with Angio Cath:  22g  IV Site: R Hand  IV Started by:  Cathlyn Parsons, RN  Chest Size (in):  36 Cup Size: n/a  Height: 5' 7.5" (1.715 m)  Weight:  180 lb (81.647 kg)  BMI:  Body mass index is 27.78 kg/(m^2). Tech Comments:  Toprol held x 24 hrs    Nuclear Med Study 1 or 2 day study: 1 day  Stress Test Type:  Lexiscan  Reading MD: Olga Millers, MD  Order Authorizing Provider:  Melany Guernsey and Lorin Picket [facility]  Resting Radionuclide: Technetium 52m Sestamibi  Resting Radionuclide Dose: 11.0 mCi   Stress Radionuclide:  Technetium 2m Sestamibi  Stress Radionuclide Dose: 33.0 mCi           Stress Protocol Rest HR: 54 Stress HR: 83  Rest BP: 137/75 Stress BP: 128/88  Exercise Time (min): n/a METS: n/a   Predicted Max HR: 158 bpm % Max HR: 52.53 bpm Rate Pressure Product: 24580   Dose of Adenosine (mg):  n/a Dose of Lexiscan: 0.4 mg  Dose of Atropine (mg): n/a Dose of Dobutamine: n/a  Stress Test Technologist: Smiley Houseman, CMA-N  Nuclear Technologist:  Domenic Polite, CNMT     Rest Procedure:  Myocardial perfusion imaging was performed at rest 45 minutes following the intravenous administration of Technetium 34m Sestamibi.  Rest ECG: Nonspecific T-wave changes withh  occasional PVC's.  Stress Procedure:  The patient received IV Lexiscan 0.4 mg over 15-seconds.  Technetium 29m Sestamibi was injected at 30-seconds.  There were no significant changes with Lexiscan, occasional PVC's were noted.  Quantitative spect images were obtained after a 45 minute delay.  Stress ECG: No significant ST segment change suggestive of ischemia.  QPS Raw Data Images:  Acquisition technically good; normal left ventricular size. Stress Images:  There is decreased uptake in the inferior wall. Rest Images:  There is decreased uptake in the inferior wall, less prominent compared to the stress images. Subtraction (SDS):  These findings are consistent with prior inferior infarct and very mild peri-infarct ischemia. Transient Ischemic Dilatation (Normal <1.22):  1.14 Lung/Heart Ratio (Normal <0.45):  0.39  Quantitative Gated Spect Images QGS EDV:  92 ml QGS ESV:  47 ml  Impression Exercise Capacity:  Lexiscan with no exercise. BP Response:  Normal blood pressure response. Clinical Symptoms:  There is dyspnea. ECG Impression:  No significant ST segment change suggestive of ischemia. Comparison with Prior Nuclear Study: No previous nuclear study performed  Overall Impression:  Abnormal stress nuclear study with a moderate size, moderate intensity, partially reversible inferior defect consistent with prior inferior infarct and very mild peri-infarct ischemia.  LV Ejection Fraction: 49%.  LV Wall Motion:  Inferior hypokinesis.   Olga Millers

## 2012-02-03 ENCOUNTER — Encounter (INDEPENDENT_AMBULATORY_CARE_PROVIDER_SITE_OTHER): Payer: Medicare Other

## 2012-02-03 DIAGNOSIS — M79606 Pain in leg, unspecified: Secondary | ICD-10-CM

## 2012-02-03 DIAGNOSIS — R079 Chest pain, unspecified: Secondary | ICD-10-CM

## 2012-02-03 DIAGNOSIS — I739 Peripheral vascular disease, unspecified: Secondary | ICD-10-CM

## 2012-02-03 DIAGNOSIS — I2510 Atherosclerotic heart disease of native coronary artery without angina pectoris: Secondary | ICD-10-CM

## 2012-02-03 DIAGNOSIS — M79609 Pain in unspecified limb: Secondary | ICD-10-CM

## 2012-02-03 DIAGNOSIS — R05 Cough: Secondary | ICD-10-CM

## 2012-02-03 DIAGNOSIS — I10 Essential (primary) hypertension: Secondary | ICD-10-CM

## 2012-02-03 DIAGNOSIS — I6529 Occlusion and stenosis of unspecified carotid artery: Secondary | ICD-10-CM

## 2012-02-04 ENCOUNTER — Encounter: Payer: Self-pay | Admitting: Physician Assistant

## 2012-02-15 ENCOUNTER — Ambulatory Visit: Payer: Medicare Other | Admitting: Cardiology

## 2012-02-16 ENCOUNTER — Telehealth: Payer: Self-pay

## 2012-02-16 DIAGNOSIS — M545 Low back pain: Secondary | ICD-10-CM

## 2012-02-16 NOTE — Telephone Encounter (Signed)
We have notes concerning lumbar DDD and LBP in OV notes in Apr and Aug/2013. Can we refer pt?

## 2012-02-16 NOTE — Telephone Encounter (Signed)
PATIENT WOULD LIKE [place] TO SET UP A REFERRAL FOR HIM WITH DR Danielle Dess FOR HIS BACK.  BEST [phone]

## 2012-02-16 NOTE — Telephone Encounter (Signed)
Patient advised we are working on this.  

## 2012-02-16 NOTE — Telephone Encounter (Signed)
Referral made 

## 2012-02-19 ENCOUNTER — Telehealth: Payer: Self-pay | Admitting: Cardiology

## 2012-02-19 NOTE — Telephone Encounter (Signed)
This patient was a no show for a carotid Doppler.  In hospital records from last year indicate possible high grade carotid stenosis.  He has cancelled follow up and I cannot get him on the phone.  We need to send a letter directing him to reschedule his Doppler.  I would suggest that he have follow up in [facility] since they have seen him before.  Call Chris Payne with the results and send results to [facility], MD.

## 2012-02-20 ENCOUNTER — Other Ambulatory Visit: Payer: Self-pay | Admitting: Family Medicine

## 2012-02-25 ENCOUNTER — Telehealth: Payer: Self-pay

## 2012-02-25 NOTE — Telephone Encounter (Signed)
Malachi Bonds with [employer] called to inquire about completion of orders for a back brace for the patient.  Malachi Bonds stated that the request was faxed 3 times, the last being 02/16/12,and she has had no response.  The patient is a patient of Dr. Audria Nine.  Please call Malachi Bonds at [phone] ext [phone].

## 2012-02-25 NOTE — Telephone Encounter (Signed)
Checked w/Dr Audria Nine who stated that she did get the order and she will address it today. Notified Gloria at [facility] that we have received it and we will return it shortly.

## 2012-03-01 ENCOUNTER — Telehealth: Payer: Self-pay

## 2012-03-01 NOTE — Telephone Encounter (Signed)
PATIENT STATES HIS PHARMACY ([facility] ON [address]) FAXED OVER A REFILL REQUEST FOR THE CREAM DR. MCPHERSON GIVES HIM FOR THE BREAKING OUT HE HAS BETWEEN HIS LEGS 3 OR 4 DAYS AGO. THEY HAVE NOT HEARD ANYTHING BACK FROM [place] YET. HE WOULD LIKE TO KNOW WHAT THE HOLD UP IS BECAUSE HE IS BREAKING OUT FAST. BEST PHONE [phone] (HOME)  PHARMACY CHOICE IS [facility] ON [address].  MBC

## 2012-03-02 NOTE — Telephone Encounter (Signed)
I called in RF for Nystatin ointment  60 grams (2 RFs)   Sig: Apply to clean dry skin bid.  I spoke with pt's son and advised that this would be done.

## 2012-03-02 NOTE — Telephone Encounter (Signed)
I have not gotten a request from pharmacy, he does not know name of med

## 2012-03-04 ENCOUNTER — Encounter: Payer: Self-pay | Admitting: Family Medicine

## 2012-03-04 ENCOUNTER — Ambulatory Visit (INDEPENDENT_AMBULATORY_CARE_PROVIDER_SITE_OTHER): Payer: Medicare Other | Admitting: Family Medicine

## 2012-03-04 ENCOUNTER — Ambulatory Visit: Payer: Medicare Other

## 2012-03-04 VITALS — BP 122/78 | HR 62 | Temp 97.9°F | Resp 16 | Ht 64.0 in | Wt 180.0 lb

## 2012-03-04 DIAGNOSIS — J209 Acute bronchitis, unspecified: Secondary | ICD-10-CM

## 2012-03-04 DIAGNOSIS — R600 Localized edema: Secondary | ICD-10-CM

## 2012-03-04 DIAGNOSIS — R05 Cough: Secondary | ICD-10-CM

## 2012-03-04 DIAGNOSIS — G894 Chronic pain syndrome: Secondary | ICD-10-CM

## 2012-03-04 DIAGNOSIS — R609 Edema, unspecified: Secondary | ICD-10-CM

## 2012-03-04 MED ORDER — AMOXICILLIN-POT CLAVULANATE 875-125 MG PO TABS: 1.0000 | ORAL_TABLET | Freq: Two times a day (BID) | ORAL | Status: DC

## 2012-03-04 MED ORDER — HYDROCODONE-ACETAMINOPHEN 7.5-750 MG PO TABS: 1.0000 | ORAL_TABLET | Freq: Three times a day (TID) | ORAL | Status: DC | PRN

## 2012-03-04 NOTE — Progress Notes (Signed)
S: This 61 y.o. Cauc male presents with productive cough and SOB for 4 days. Other symptoms include low-grade fever w/o chills and decreased appetite. He denies n/v/d. He does have a chronic rash on lower ext associated with chronic edema. There is some redness but no streaking; there has not been significant ulceration or drainage.  Chronic pain persists and pt requests change in strength of HC-APAP; current dose not sufficient for pain control.  ROS: As per HPI.  Pt denies sinus pain or nasal drainage, HA, hemoptysis, increasing SOB, increasing weakness or syncope.   O: Filed Vitals:   03/04/12 1044  BP: 122/78  Pulse: 62  Temp: 97.9 F (36.6 C)  Resp: 16   GEN: In NAD; WN,WD. Does not appear sickly but does look chronically ill. HEENT: [place]/AT. EOMI w/ injected conj. Sclerae nonicteric. TMs normal. Nontender sinuses. Post ph erythematous w/o exudate. NECK: No LAN or JVD. LUNGS: Decreased BS at bases; no wheezes or rhonchi. Cough is productive. Normal resp rate and effort. SKIN: Lower ext- per-tibial edema with erythema; minimal skin breakdown. No ulcerations. NEURO: A&O x 3; CNs intact. Mentation- flat affect.   [facility] reading (PRIMARY) by  Dr. Audria Nine:  CXR- flattened diaphragm; no active disease , effusion, mass or areas of consolidation; sternotomy wires in place.    A/P:  1. Cough  DG Chest 2 View  2. Bronchitis, acute  RX: Augmentin 875-125 mg  1 tab bid with food   #20  3. Chronic pain disorder  RX: HC-APAP  7.5-750  1 tab every 8 hours. Consider checking Vit D level.  4. Bilateral lower extremity edema  Elevate legs; follow-up with vascular specialist and continue current medications.

## 2012-03-10 ENCOUNTER — Ambulatory Visit: Payer: Medicare Other | Admitting: Cardiology

## 2012-03-13 ENCOUNTER — Other Ambulatory Visit: Payer: Self-pay | Admitting: Family Medicine

## 2012-03-14 ENCOUNTER — Encounter: Payer: Self-pay | Admitting: *Deleted

## 2012-03-22 ENCOUNTER — Encounter (HOSPITAL_COMMUNITY): Payer: Self-pay | Admitting: *Deleted

## 2012-03-22 ENCOUNTER — Emergency Department (HOSPITAL_COMMUNITY)
Admission: EM | Admit: 2012-03-22 | Discharge: 2012-03-22 | Disposition: A | Discharge status: Home / Self Care | Payer: Medicare Other | Attending: Emergency Medicine | Admitting: Emergency Medicine

## 2012-03-22 ENCOUNTER — Emergency Department (HOSPITAL_COMMUNITY): Payer: Medicare Other

## 2012-03-22 DIAGNOSIS — K4090 Unilateral inguinal hernia, without obstruction or gangrene, not specified as recurrent: Secondary | ICD-10-CM | POA: Insufficient documentation

## 2012-03-22 DIAGNOSIS — I714 Abdominal aortic aneurysm, without rupture, unspecified: Secondary | ICD-10-CM | POA: Insufficient documentation

## 2012-03-22 DIAGNOSIS — I10 Essential (primary) hypertension: Secondary | ICD-10-CM | POA: Insufficient documentation

## 2012-03-22 DIAGNOSIS — E785 Hyperlipidemia, unspecified: Secondary | ICD-10-CM | POA: Insufficient documentation

## 2012-03-22 DIAGNOSIS — Z7982 Long term (current) use of aspirin: Secondary | ICD-10-CM | POA: Insufficient documentation

## 2012-03-22 DIAGNOSIS — Z8673 Personal history of transient ischemic attack (TIA), and cerebral infarction without residual deficits: Secondary | ICD-10-CM | POA: Insufficient documentation

## 2012-03-22 DIAGNOSIS — Z79899 Other long term (current) drug therapy: Secondary | ICD-10-CM | POA: Insufficient documentation

## 2012-03-22 DIAGNOSIS — M79609 Pain in unspecified limb: Secondary | ICD-10-CM | POA: Insufficient documentation

## 2012-03-22 DIAGNOSIS — I6529 Occlusion and stenosis of unspecified carotid artery: Secondary | ICD-10-CM | POA: Insufficient documentation

## 2012-03-22 DIAGNOSIS — Z87891 Personal history of nicotine dependence: Secondary | ICD-10-CM | POA: Insufficient documentation

## 2012-03-22 DIAGNOSIS — Z8739 Personal history of other diseases of the musculoskeletal system and connective tissue: Secondary | ICD-10-CM | POA: Insufficient documentation

## 2012-03-22 DIAGNOSIS — I2510 Atherosclerotic heart disease of native coronary artery without angina pectoris: Secondary | ICD-10-CM | POA: Insufficient documentation

## 2012-03-22 MED ORDER — OXYCODONE-ACETAMINOPHEN 5-325 MG PO TABS: 2.0000 | ORAL_TABLET | ORAL | Status: DC | PRN

## 2012-03-22 MED ORDER — OXYCODONE-ACETAMINOPHEN 5-325 MG PO TABS
2.0000 | ORAL_TABLET | Freq: Once | ORAL | Status: AC
  Administered 2012-03-22: 2 via ORAL
  Filled 2012-03-22: qty 2

## 2012-03-22 NOTE — ED Notes (Signed)
Pt returned from radiology.

## 2012-03-22 NOTE — ED Notes (Signed)
Pt using walker leaving ED. Pt leaving with family. Pt given d/c teaching and prescriptions. Pt has no further questions upon d/c.

## 2012-03-22 NOTE — ED Notes (Signed)
The pt has had lt groin pain since yesterday.  He has a history of the same.  No urinary difficulty.  More pain with walking

## 2012-03-22 NOTE — ED Notes (Signed)
Pt denies nausea; pt denies chest pain; denies shortness of breath;pt mentating appropriately.

## 2012-03-22 NOTE — ED Provider Notes (Signed)
History     CSN: [id number]  Arrival date & time 03/22/12  1904   First MD Initiated Contact with Patient 03/22/12 1914      Chief Complaint  Patient presents with  . Groin Pain    (Consider location/radiation/quality/duration/timing/severity/associated sxs/prior treatment) The history is provided by the patient.  Chris Payne is a 61 y.o. male hx of HTN, AAA s/p stent placement here with L groin pain. Intermittent groin pain for years. Yesterday, groin pain worse. Increased pain with walking. No vomiting or abdominal pain. Still passing gas and had nl bowel movements. No testicular pain.    Past Medical History  Diagnosis Date  . Hypertension   . Stroke     h/o pontine CVA  . Arthritis   . Leg pain     with walking  . Weight loss   . AAA (abdominal aortic aneurysm)     s/p repair 6/11  . Hyperlipidemia   . CAD (coronary artery disease)     s/p CABG 2/12:  LIMA to LAD, SVG to diagonal-1, SVG to ramus intermedius,, SVG to AM (Dr. Dorris Fetch) ;  b.  Myoview 10/13:  inf infarct with very mild peri-infarct ischemia, EF 49%, inf HK   . Carotid stenosis     a.  dopplers 8/12: LICA 50-69% => repeat 11/2011  . PAD (peripheral artery disease)     ABIs 4/12:  R 0.88, L 0.92; R SFA 40%, L CFA 50%, L SFA 50-60%    Past Surgical History  Procedure Date  . Hip surgery 40 years ago    left hip removal and pinning  . Abdominal aortic aneurysm repair 10-03-2009  . Exploratory laparotomy 09/2009    ligation of lumbar arteries  . Abdominal aortic aneurysm repair   . Coronary artery bypass graft     LIMA to the LAD, SVG to first diagonal, SVG to ramus intermediate, SVG to acute marginal. EF 50%. 2/12    Family History  Problem Relation Age of Onset  . Alcohol abuse Father   . Cancer Sister     History  Substance Use Topics  . Smoking status: Former Smoker -- 1.0 packs/day for 30 years    Types: Cigarettes    Quit date: 01/25/2010  . Smokeless tobacco: Not on file  . Alcohol  Use: No     Comment: former drinker      Review of Systems  Genitourinary:       Groin pain   All other systems reviewed and are negative.    Allergies  Review of patient's allergies indicates no known allergies.  Home Medications   Current Outpatient Rx  Name  Route  Sig  Dispense  Refill  . ASPIRIN EC 81 MG PO TBEC   Oral   Take 81 mg by mouth daily.         Marland Kitchen DOCUSATE SODIUM 100 MG PO CAPS   Oral   Take 100 mg by mouth daily as needed. For constipation.         Marland Kitchen ESOMEPRAZOLE MAGNESIUM 40 MG PO CPDR   Oral   Take 40 mg by mouth daily as needed. For acid reflux         . FENOFIBRATE 54 MG PO TABS   Oral   Take 1 tablet (54 mg total) by mouth daily.   30 tablet   5   . FUROSEMIDE 20 MG PO TABS   Oral   Take 1 tablet (20 mg total) by mouth daily.  30 tablet   11   . GABAPENTIN 300 MG PO CAPS   Oral   Take 600 mg by mouth at bedtime.         Marland Kitchen LISINOPRIL 10 MG PO TABS   Oral   Take 2 tablets (20 mg total) by mouth daily.   60 tablet   12   . METOPROLOL SUCCINATE ER 25 MG PO TB24   Oral   Take 1 tablet (25 mg total) by mouth daily.   30 tablet   12   . MORPHINE SULFATE ER 100 MG PO TBCR   Oral   Take 25 mg by mouth 2 (two) times daily as needed. For pain         . NITROGLYCERIN 0.4 MG SL SUBL   Sublingual   Place 1 tablet (0.4 mg total) under the tongue every 5 (five) minutes as needed for chest pain.   25 tablet   3   . NYSTATIN 100000 UNIT/GM EX OINT   Topical   Apply 1 application topically 2 (two) times daily. For 2 weeks , filled 03/04/2012         . OXYCODONE-ACETAMINOPHEN 10-325 MG PO TABS   Oral   Take 0.5 tablets by mouth every 8 (eight) hours as needed. For pain         . SIMVASTATIN 40 MG PO TABS   Oral   Take 1 tablet (40 mg total) by mouth at bedtime.   30 tablet   5     BP 133/75  Pulse 57  Temp 98.3 F (36.8 C) (Oral)  Resp 18  SpO2 95%  Physical Exam  Nursing note and vitals  reviewed. Constitutional: He is oriented to person, place, and time. He appears well-developed and well-nourished.  HENT:  Head: Normocephalic.  Mouth/Throat: Oropharynx is clear and moist.  Eyes: Conjunctivae normal are normal. Pupils are equal, round, and reactive to light.  Neck: Normal range of motion. Neck supple.  Cardiovascular: Normal rate, regular rhythm and normal heart sounds.   Pulmonary/Chest: Effort normal and breath sounds normal. No respiratory distress. He has no wheezes. He has no rales.  Abdominal: Soft. Bowel sounds are normal. He exhibits no distension. There is no tenderness.       + L indirect inguinal hernia that is soft and reducible. No evidence of incarceration.   Genitourinary:       Testicles nontender   Musculoskeletal: Normal range of motion.  Neurological: He is alert and oriented to person, place, and time.  Skin: Skin is warm and dry.  Psychiatric: He has a normal mood and affect. His behavior is normal. Judgment and thought content normal.    ED Course  Procedures (including critical care time)  Labs Reviewed - No data to display Dg Abd Acute W/chest  03/22/2012  *RADIOLOGY REPORT*  Clinical Data: Left inguinal hernia pain.  ACUTE ABDOMEN SERIES (ABDOMEN 2 VIEW & CHEST 1 VIEW)  Comparison: Chest x-ray 01/12/2012.  Findings: Lung volumes are low.  Linear opacities in the left mid - lower lung are compatible with areas of subsegmental atelectasis and/or scarring.  No acute consolidative airspace disease.  No pleural effusions.  No evidence of pulmonary edema.  No pneumothorax.  Heart size is normal.  Tortuosity and atherosclerosis of the thoracic aorta.  Status post median sternotomy for CABG.  Supine and upright views of the abdomen demonstrate gas and stool scattered throughout the colon extending to the level of the distal rectum.  There are  a few nondilated loops of gas-filled small bowel scattered throughout the abdomen which are nonspecific.  Postoperative changes of ORIF are noted in the left hip with significant post-traumatic deformity.  IMPRESSION: 1.  Nonspecific, nonobstructive bowel gas pattern. 2.  No pneumoperitoneum. 3.  No radiographic evidence of acute cardiopulmonary disease.   Original Report Authenticated By: Trudie Reed, M.D.      No diagnosis found.    MDM  Chris Payne is a 61 y.o. male here with L inguinal hernia. Hernia reducible, not incarcerated. Will give pain meds and d/c home with surgery f/u.          Richardean Canal, MD 03/22/12 2036

## 2012-04-01 ENCOUNTER — Ambulatory Visit: Payer: Medicare Other | Admitting: Family Medicine

## 2012-04-04 ENCOUNTER — Encounter (INDEPENDENT_AMBULATORY_CARE_PROVIDER_SITE_OTHER): Payer: Self-pay | Admitting: General Surgery

## 2012-04-04 ENCOUNTER — Ambulatory Visit (INDEPENDENT_AMBULATORY_CARE_PROVIDER_SITE_OTHER): Payer: Medicare Other | Admitting: General Surgery

## 2012-04-04 VITALS — BP 132/88 | HR 65 | Temp 97.8°F | Ht 67.0 in | Wt 178.2 lb

## 2012-04-04 DIAGNOSIS — R1031 Right lower quadrant pain: Secondary | ICD-10-CM | POA: Insufficient documentation

## 2012-04-04 DIAGNOSIS — R109 Unspecified abdominal pain: Secondary | ICD-10-CM

## 2012-04-04 NOTE — Progress Notes (Signed)
Subjective:     Patient ID: Chris Payne, male   DOB: Apr 04, 1951, 61 y.o.   MRN: [id number]  HPI We are asked to see the patient in consultation by Dr. Audria Nine to evaluate him for a left inguinal hernia. The patient is a 61 year old white male whose been experiencing pain in his left groin when he coughs for the last month. He also notes constipation from time to time. He denies any fevers or chills. He denies any nausea or vomiting. He has not noticed a bulge in the left groin. He actually states that he has discomfort in both groins.  Review of Systems  Constitutional: Negative.   HENT: Negative.   Eyes: Negative.   Respiratory: Negative.   Cardiovascular: Negative.   Gastrointestinal: Negative.   Genitourinary: Negative.   Musculoskeletal: Negative.   Skin: Negative.   Neurological: Negative.   Hematological: Negative.   Psychiatric/Behavioral: Negative.        Objective:   Physical Exam  Constitutional: He is oriented to person, place, and time. He appears well-developed and well-nourished.  HENT:  Head: Normocephalic and atraumatic.  Eyes: Conjunctivae normal and EOM are normal. Pupils are equal, round, and reactive to light.  Neck: Normal range of motion. Neck supple.  Cardiovascular: Normal rate, regular rhythm and normal heart sounds.   Pulmonary/Chest: Effort normal and breath sounds normal.  Abdominal: Soft. Bowel sounds are normal.  Genitourinary:       The patient is unable to stand up straight but I do think there may be some fullness in the left groin that reduces easily. I do not palpate any fullness or impulse with straining in the right groin.  Musculoskeletal: Normal range of motion.  Neurological: He is alert and oriented to person, place, and time.  Skin: Skin is warm and dry.  Psychiatric: He has a normal mood and affect. His behavior is normal.       Assessment:     The patient is having bilateral groin pain. It is difficult to tell on physical  exam whether he has hernias on both sides. Because of this I would recommend a CT scan of his pelvis to look for evidence of inguinal hernias. He also has significant cardiac disease. We will plan to get cardiac clearance on him.    Plan:     Plan for CT of the pelvis and cardiac clearance. We will plan to see him back in about 3 weeks ago the results of the studies.

## 2012-04-04 NOTE — Patient Instructions (Signed)
Plan for cardiac clearance and CT pelvis

## 2012-04-05 ENCOUNTER — Ambulatory Visit
Admission: RE | Admit: 2012-04-05 | Discharge: 2012-04-05 | Disposition: A | Discharge status: Home / Self Care | Payer: Medicare Other | Source: Ambulatory Visit | Attending: General Surgery | Admitting: General Surgery

## 2012-04-05 DIAGNOSIS — R1031 Right lower quadrant pain: Secondary | ICD-10-CM

## 2012-04-05 MED ORDER — IOHEXOL 300 MG/ML  SOLN
100.0000 mL | Freq: Once | INTRAMUSCULAR | Status: AC | PRN
  Administered 2012-04-05: 100 mL via INTRAVENOUS

## 2012-04-07 ENCOUNTER — Telehealth: Payer: Self-pay | Admitting: Cardiology

## 2012-04-07 NOTE — Telephone Encounter (Signed)
New problem:   Office notes receive from [facility] - Per Elita Quick - RN to schedule an appt with PA or Extender for upcoming surgery - possible hernia repair.   Unable to reach patient through the phone number [phone] voice mail is not set up  tried the son phone number [phone] operator is stating the voice mail is not set up as well.   Contact Jade from [facility] to notified her of the situation . Forwarding message over to nurse Pam.

## 2012-04-07 NOTE — Telephone Encounter (Signed)
noted 

## 2012-04-08 ENCOUNTER — Telehealth (INDEPENDENT_AMBULATORY_CARE_PROVIDER_SITE_OTHER): Payer: Self-pay | Admitting: General Surgery

## 2012-04-08 NOTE — Telephone Encounter (Signed)
Message copied by Littie Deeds on Fri Apr 08, 2012  9:10 AM ------      Message from: [place], [place]      Created: Thu Apr 07, 2012  2:22 PM       [facility] called re: this patient. I guess I requested cardiac clearance when I was working with Dr Carolynne Edouard. They said patient needs an appt with them first. They tried contacting patient with no answer and no way to leave message.             Lesly Rubenstein

## 2012-04-08 NOTE — Telephone Encounter (Signed)
Spoke with patient and informed him that [facility] informed us that he will need to be seen before they will give him clearance for surgery.  Explained that they have been trying to get in touch with him and could not to give him an appt.  Explained that he needs to call [phone] to set up an appt to receive cardiac clearance.  He said he would do this.

## 2012-04-14 ENCOUNTER — Ambulatory Visit: Payer: Medicare Other | Admitting: Cardiology

## 2012-05-04 ENCOUNTER — Encounter (INDEPENDENT_AMBULATORY_CARE_PROVIDER_SITE_OTHER): Payer: Medicare Other | Admitting: General Surgery

## 2012-05-05 ENCOUNTER — Telehealth (INDEPENDENT_AMBULATORY_CARE_PROVIDER_SITE_OTHER): Payer: Self-pay

## 2012-05-05 NOTE — Telephone Encounter (Signed)
Message copied by Brennan Bailey on Thu May 05, 2012  9:02 AM ------      Message from: Littie Deeds      Created: Wed May 04, 2012  8:44 AM                   ----- Message -----         From: Zacarias Pontes         Sent: 05/03/2012   9:27 AM           To: Littie Deeds            Pt called to cx 1/8 apt date due to another apt elsewhere.He needs another apt please.i looked and didn't see anything will you please r/s this ?

## 2012-05-05 NOTE — Telephone Encounter (Signed)
Called pt and gave appt for 2/3 @ 4:50.

## 2012-05-10 ENCOUNTER — Encounter: Payer: Self-pay | Admitting: Cardiology

## 2012-05-10 ENCOUNTER — Ambulatory Visit (INDEPENDENT_AMBULATORY_CARE_PROVIDER_SITE_OTHER): Payer: Medicare Other | Admitting: Cardiology

## 2012-05-10 VITALS — BP 125/82 | HR 54 | Ht 67.0 in | Wt 180.0 lb

## 2012-05-10 DIAGNOSIS — I2510 Atherosclerotic heart disease of native coronary artery without angina pectoris: Secondary | ICD-10-CM

## 2012-05-10 DIAGNOSIS — I6350 Cerebral infarction due to unspecified occlusion or stenosis of unspecified cerebral artery: Secondary | ICD-10-CM

## 2012-05-10 DIAGNOSIS — R609 Edema, unspecified: Secondary | ICD-10-CM

## 2012-05-10 DIAGNOSIS — I10 Essential (primary) hypertension: Secondary | ICD-10-CM

## 2012-05-10 DIAGNOSIS — I714 Abdominal aortic aneurysm, without rupture: Secondary | ICD-10-CM

## 2012-05-10 DIAGNOSIS — I639 Cerebral infarction, unspecified: Secondary | ICD-10-CM

## 2012-05-10 MED ORDER — ATORVASTATIN CALCIUM 40 MG PO TABS: 40.0000 mg | ORAL_TABLET | Freq: Every day | ORAL | Status: DC

## 2012-05-10 NOTE — Patient Instructions (Addendum)
Please stop folic acid and simvastatin. Start Lipitor (atrovastatin) 40 mg a day Continue all other medications as listed  Follow up in 1 year with Dr Antoine Poche.  You will receive a letter in the mail 2 months before you are due.  Please call us when you receive this letter to schedule your follow up appointment.

## 2012-05-10 NOTE — Progress Notes (Signed)
HPI The patient presents as a new patient for followup of vascular disease. He's had an abdominal aortic aneurysm requiring surgical repair. 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 Not long after this he had chest discomfort and was found to have three-vessel coronary disease and was status post CABG. He is being evaluated for inguinal hernia repair.  At the last visit he saw Mr. Alben Spittle in did mention some chest discomfort. Stress perfusion study demonstrated an EF of 49%. There was evidence of an inferior defect consistent with previous infarct with only very mild surrounding ischemia. This was felt to be a low risk study.  Since that time he has had no further chest discomfort. He gets around with a walker. He is limited by back pain and leg discomfort. Of note ABIs are recorded below. He denies any new shortness of breath, PND or orthopnea. He's having no new fevers chills or cough.  No Known Allergies  Current Outpatient Prescriptions  Medication Sig Dispense Refill  . aspirin EC 81 MG tablet Take 81 mg by mouth daily.      Marland Kitchen docusate sodium (COLACE) 100 MG capsule Take 100 mg by mouth daily as needed. For constipation.      Marland Kitchen esomeprazole (NEXIUM) 40 MG capsule Take 40 mg by mouth daily as needed. For acid reflux      . fenofibrate 54 MG tablet Take 1 tablet (54 mg total) by mouth daily.  30 tablet  5  . furosemide (LASIX) 20 MG tablet Take 1 tablet (20 mg total) by mouth daily.  30 tablet  11  . gabapentin (NEURONTIN) 300 MG capsule Take 600 mg by mouth at bedtime.      Marland Kitchen lisinopril (PRINIVIL,ZESTRIL) 10 MG tablet Take 2 tablets (20 mg total) by mouth daily.  60 tablet  12  . metoprolol succinate (TOPROL-XL) 25 MG 24 hr tablet Take 1 tablet (25 mg total) by mouth daily.  30 tablet  12  . morphine (MS CONTIN) 100 MG 12 hr tablet Take 25 mg by mouth 2 (two) times daily as needed. For pain      . nitroGLYCERIN (NITROSTAT) 0.4 MG SL tablet Place 1 tablet (0.4 mg total)  under the tongue every 5 (five) minutes as needed for chest pain.  25 tablet  3  . nystatin ointment (MYCOSTATIN) Apply 1 application topically 2 (two) times daily. For 2 weeks , filled 03/04/2012      . oxyCODONE-acetaminophen (PERCOCET) 10-325 MG per tablet Take 0.5 tablets by mouth every 8 (eight) hours as needed. For pain      . oxyCODONE-acetaminophen (PERCOCET) 5-325 MG per tablet Take 2 tablets by mouth every 4 (four) hours as needed for pain.  10 tablet  0  . simvastatin (ZOCOR) 40 MG tablet Take 1 tablet (40 mg total) by mouth at bedtime.  30 tablet  5    Past Medical History  Diagnosis Date  . Hypertension   . Stroke     h/o pontine CVA  . Arthritis   . Leg pain     with walking  . Weight loss   . AAA (abdominal aortic aneurysm)     s/p repair 6/11  . Hyperlipidemia   . CAD (coronary artery disease)     s/p CABG 2/12:  LIMA to LAD, SVG to diagonal-1, SVG to ramus intermedius,, SVG to AM (Dr. Dorris Fetch) ;  b.  Myoview 10/13:  inf infarct with very mild peri-infarct ischemia, EF 49%, inf  HK   . Carotid stenosis     a.  dopplers 8/12: LICA 50-69% => repeat 11/2011  . PAD (peripheral artery disease)     ABIs 4/12:  R 0.88, L 0.92; R SFA 40%, L CFA 50%, L SFA 50-60%  . COPD (chronic obstructive pulmonary disease)     Past Surgical History  Procedure Date  . Hip surgery 40 years ago    left hip removal and pinning  . Abdominal aortic aneurysm repair 10-03-2009  . Exploratory laparotomy 09/2009    ligation of lumbar arteries  . Abdominal aortic aneurysm repair   . Coronary artery bypass graft     LIMA to the LAD, SVG to first diagonal, SVG to ramus intermediate, SVG to acute marginal. EF 50%. 2/12   I ROS:  Positive for constipation, leg cramping. Otherwise as stated in the HPI and negative for all other systems.  PHYSICAL EXAM BP 125/82  Pulse 54  Ht 5\' 7"  (1.702 m)  Wt 180 lb (81.647 kg)  BMI 28.19 kg/m2 GENERAL: Looks older than stated age HEENT:  Pupils equal  round and reactive, fundi not visualized, oral mucosa unremarkable, dentures NECK:  No jugular venous distention, waveform within normal limits, carotid upstroke brisk and symmetric, no bruits, no thyromegaly LYMPHATICS:  No cervical, inguinal adenopathy LUNGS:  Clear to auscultation bilaterally BACK:  No CVA tenderness CHEST:  Well healed sternotomy scar. HEART:  PMI not displaced or sustained,S1 and S2 within normal limits, no S3, no S4, no clicks, no rubs, no murmurs ABD:  Flat, positive bowel sounds normal in frequency in pitch, no bruits, no rebound, no guarding, no midline pulsatile mass, no hepatomegaly, no splenomegaly, abdominal scar. EXT:  2 plus pulses upper, diminished bilateral lower, moderate bilateral lower extremity edema, no cyanosis no clubbing, bilateral bruits, left leg is shorter than right  SKIN:  No rashes no nodules NEURO:  Cranial nerves II through XII grossly intact, motor grossly intact throughout PSYCH:  Cognitively intact, oriented to person place and time  EKG:  Sinus rhythm, rate 54, axis within normal limits, intervals within normal limits, nonspecific anterior T-wave inversion, poor anterior R wave progression, no acute ST-T wave changes since previous ECG.  Rate is slower.  PAC.   05/10/2012  ASSESSMENT AND PLAN  CAD:  The patient had a low risk perfusion study. He will continue with aggressive risk reduction. No further cardiovascular testing is suggested. Based on ACC/AHA guidelines, the patient would be at acceptable risk for the planned procedure without further cardiovascular testing.  Leg Pain: I suspect this is more related to back pain and neuropathy. ABIs demonstrated some distal vessel disease at the level of his feet but are normal down to the ankle. He will continue to follow his primary provider for management of this.  Ischemic Cardiomyopathy: EF mildly depressed.  He seems to be euvolemic.  At this point, no change in therapy is indicated.  We have  reviewed salt and fluid restrictions.  No further cardiovascular testing is indicated.  Carotid Stenosis: I do not see any recent dopplers. Will see if done at Dr. Hazle Coca. If not, get carotid dopplers.   Hyperlipidemia:  I would like to switch him to a moderate to high dose of statin per recent guidelines. I will stop his fibric acid and start Lipitor 40 mg daily.  Abdominal Aortic Aneurysm, s/p Repair: Followed by [facility].

## 2012-05-12 ENCOUNTER — Telehealth: Payer: Self-pay | Admitting: Cardiology

## 2012-05-12 ENCOUNTER — Other Ambulatory Visit: Payer: Self-pay | Admitting: Family Medicine

## 2012-05-12 ENCOUNTER — Telehealth: Payer: Self-pay | Admitting: Radiology

## 2012-05-12 MED ORDER — HYDROCODONE-APAP-DIETARY PROD 7.5-750 MG PO MISC: ORAL | Status: DC

## 2012-05-12 MED ORDER — HYDROCODONE-ACETAMINOPHEN 7.5-325 MG PO TABS: ORAL_TABLET | ORAL | Status: DC

## 2012-05-12 NOTE — Telephone Encounter (Signed)
New problem:     seen in the office on 1/14 . Patient need clarification on which medication he was put on.

## 2012-05-12 NOTE — Telephone Encounter (Signed)
Patient advised.

## 2012-05-12 NOTE — Telephone Encounter (Signed)
Pharmacy called to clarify rx, states dr Audria Nine called in an rx for a strength that is not made anymore Please call, carol at [facility] on [address]

## 2012-05-12 NOTE — Telephone Encounter (Signed)
Message copied by Caffie Damme on Thu May 12, 2012  9:50 AM ------      Message from: JEFFERY, CHELLE S      Created: Thu May 12, 2012  9:36 AM      Regarding: FW: Medication refill request       Please call the pharmacy as below and I'll do a Controlled Substance Database search.                  ----- Message -----         From: Maurice March, MD         Sent: 05/12/2012   8:58 AM           To: [facility] Refill Pool      Subject: Medication refill request                                Can you call the [facility] on [address]- Ph # [phone] and find out when pt last had Hydrocodone-APAP or Oxycodone filled.      The last time I prescribed Hydrocodone-APAP for him was in Nov 2013 but I do not see this medication on his medication list (I may have hand-written it but I don't think that is the case). I need to know if pt had HC-APAP 7.5-750 RX filled there; if not, he will have to wait until his follow-up appt w/ me to address this.      ----- Message -----         From: Rollene Rotunda, MD         Sent: 05/10/2012  12:12 PM           To: Maurice March, MD            Hey Dr. Audria Nine,  This patient was telling me he needed pain meds for his back.  I asked him to check with you and that I would drop you a note.  Thanks.  Jake Hochrein.

## 2012-05-12 NOTE — Telephone Encounter (Signed)
Reviewed changes with pt and reminded him they are on the discharge instruction given to him 05/10/12 - pt stated understanding and had no further questions

## 2012-05-12 NOTE — Telephone Encounter (Signed)
Please contact pt to let him know that his pain medication has been phoned in to his pharmacy.

## 2012-05-12 NOTE — Telephone Encounter (Signed)
Spoke w/ pharmacist and changed med: HC-APAP 7.5-325 mg  #30  w/ 1 RF- 1/2 tab every 8 hours prn pain.

## 2012-05-12 NOTE — Telephone Encounter (Signed)
Spoke to pharmacy and Nov 8th was last Rx Hydrocodone , he did get Percocet 5/325 from Dr Thurnell Lose #10 03/23/12 and then on 03/29/12 #90 from Dr Jeri Cos. I will bring you the Database.

## 2012-05-30 ENCOUNTER — Encounter (INDEPENDENT_AMBULATORY_CARE_PROVIDER_SITE_OTHER): Payer: Self-pay | Admitting: General Surgery

## 2012-05-30 ENCOUNTER — Ambulatory Visit (INDEPENDENT_AMBULATORY_CARE_PROVIDER_SITE_OTHER): Payer: Medicare Other | Admitting: General Surgery

## 2012-05-30 VITALS — BP 126/72 | HR 62 | Temp 98.0°F | Resp 18 | Ht 67.0 in | Wt 181.0 lb

## 2012-05-30 DIAGNOSIS — K4090 Unilateral inguinal hernia, without obstruction or gangrene, not specified as recurrent: Secondary | ICD-10-CM | POA: Insufficient documentation

## 2012-05-30 NOTE — Progress Notes (Signed)
Subjective:     Patient ID: Chris Payne, male   DOB: May 04, 1950, 62 y.o.   MRN: [id number]  HPI The patient is a 62 year old white male who we saw recently with bilateral groin pain. Most of his pain is on the left side. It was difficult to appreciate an inguinal hernia on physical exam so we had him undergo a CT scan of his pelvis. The CT scan did show a left inguinal hernia. He continues to have some occasional pain in the left groin. He denies any nausea or vomiting. He denies any chest pain or shortness of breath. He has had a recent cold which is slowly resolving.  Review of Systems  Constitutional: Negative.   HENT: Negative.   Eyes: Negative.   Respiratory: Negative.   Cardiovascular: Negative.   Gastrointestinal: Negative.   Genitourinary: Negative.   Musculoskeletal: Negative.   Skin: Negative.   Neurological: Negative.   Hematological: Negative.   Psychiatric/Behavioral: Negative.        Objective:   Physical Exam  Constitutional: He is oriented to person, place, and time. He appears well-developed and well-nourished.  HENT:  Head: Normocephalic and atraumatic.  Eyes: Conjunctivae normal and EOM are normal. Pupils are equal, round, and reactive to light.  Neck: Normal range of motion. Neck supple.  Cardiovascular: Normal rate, regular rhythm and normal heart sounds.   Pulmonary/Chest: Effort normal and breath sounds normal.  Abdominal: Soft. Bowel sounds are normal.  Genitourinary:       The patient is unable to stand straight. It is still difficult to palpate a bulge or impulse with straining in the left groin.  Musculoskeletal:       He walks with a walker secondary to a previous stroke  Neurological: He is alert and oriented to person, place, and time.  Skin: Skin is warm and dry.  Psychiatric: He has a normal mood and affect. His behavior is normal.       Assessment:     The patient has a symptomatic left inguinal hernia. Because of the risk of  incarceration stimulation at think he would benefit from having this fixed. He would also like to have this done. I've discussed with him in detail the risks and benefits of the operation to fix the hernia as well as some of the technical aspects and he understands and wishes to proceed. We have received cardiac clearance and they feel as though he is low risk    Plan:     Plan for left inguinal hernia repair with mesh

## 2012-05-30 NOTE — Patient Instructions (Signed)
Plan for left inguinal hernia repair with mesh 

## 2012-06-19 ENCOUNTER — Emergency Department (HOSPITAL_COMMUNITY): Payer: Medicare Other

## 2012-06-19 ENCOUNTER — Observation Stay (HOSPITAL_COMMUNITY)
Admission: EM | Admit: 2012-06-19 | Discharge: 2012-06-22 | Disposition: A | Discharge status: Home / Self Care | Payer: Medicare Other | Attending: Cardiology | Admitting: Cardiology

## 2012-06-19 ENCOUNTER — Encounter (HOSPITAL_COMMUNITY): Payer: Self-pay | Admitting: *Deleted

## 2012-06-19 DIAGNOSIS — I739 Peripheral vascular disease, unspecified: Secondary | ICD-10-CM | POA: Insufficient documentation

## 2012-06-19 DIAGNOSIS — R079 Chest pain, unspecified: Secondary | ICD-10-CM | POA: Insufficient documentation

## 2012-06-19 DIAGNOSIS — I10 Essential (primary) hypertension: Secondary | ICD-10-CM | POA: Insufficient documentation

## 2012-06-19 DIAGNOSIS — I6529 Occlusion and stenosis of unspecified carotid artery: Secondary | ICD-10-CM | POA: Insufficient documentation

## 2012-06-19 DIAGNOSIS — J449 Chronic obstructive pulmonary disease, unspecified: Secondary | ICD-10-CM | POA: Insufficient documentation

## 2012-06-19 DIAGNOSIS — E785 Hyperlipidemia, unspecified: Secondary | ICD-10-CM | POA: Insufficient documentation

## 2012-06-19 DIAGNOSIS — I2510 Atherosclerotic heart disease of native coronary artery without angina pectoris: Secondary | ICD-10-CM | POA: Insufficient documentation

## 2012-06-19 DIAGNOSIS — J4489 Other specified chronic obstructive pulmonary disease: Secondary | ICD-10-CM | POA: Insufficient documentation

## 2012-06-19 DIAGNOSIS — I25810 Atherosclerosis of coronary artery bypass graft(s) without angina pectoris: Principal | ICD-10-CM | POA: Insufficient documentation

## 2012-06-19 DIAGNOSIS — K5900 Constipation, unspecified: Secondary | ICD-10-CM | POA: Insufficient documentation

## 2012-06-19 DIAGNOSIS — I200 Unstable angina: Secondary | ICD-10-CM | POA: Diagnosis present

## 2012-06-19 HISTORY — DX: Unilateral inguinal hernia, without obstruction or gangrene, not specified as recurrent: K40.90

## 2012-06-19 LAB — URINALYSIS, ROUTINE W REFLEX MICROSCOPIC
Glucose, UA: NEGATIVE mg/dL
Ketones, ur: NEGATIVE mg/dL
Leukocytes, UA: NEGATIVE
Nitrite: NEGATIVE
pH: 5.5 (ref 5.0–8.0)

## 2012-06-19 LAB — CBC WITH DIFFERENTIAL/PLATELET
Basophils Absolute: 0 10*3/uL (ref 0.0–0.1)
Eosinophils Relative: 2 % (ref 0–5)
Lymphocytes Relative: 41 % (ref 12–46)
Lymphs Abs: 2.5 10*3/uL (ref 0.7–4.0)
MCV: 96.6 fL (ref 78.0–100.0)
Neutro Abs: 2.8 10*3/uL (ref 1.7–7.7)
Neutrophils Relative %: 47 % (ref 43–77)
Platelets: 252 10*3/uL (ref 150–400)
RBC: 5.02 MIL/uL (ref 4.22–5.81)
RDW: 12.4 % (ref 11.5–15.5)
WBC: 6 10*3/uL (ref 4.0–10.5)

## 2012-06-19 LAB — COMPREHENSIVE METABOLIC PANEL
ALT: 19 U/L (ref 0–53)
Alkaline Phosphatase: 91 U/L (ref 39–117)
BUN: 8 mg/dL (ref 6–23)
CO2: 25 mEq/L (ref 19–32)
Chloride: 102 mEq/L (ref 96–112)
GFR calc Af Amer: >90 mL/min (ref >90)
Glucose, Bld: 108 mg/dL — ABNORMAL HIGH (ref 70–99)
Potassium: 3.6 mEq/L (ref 3.5–5.1)
Sodium: 138 mEq/L (ref 135–145)
Total Bilirubin: 0.4 mg/dL (ref 0.3–1.2)

## 2012-06-19 LAB — BASIC METABOLIC PANEL
BUN: 9 mg/dL (ref 6–23)
Calcium: 9.2 mg/dL (ref 8.4–10.5)
GFR calc Af Amer: >90 mL/min (ref >90)
GFR calc non Af Amer: >90 mL/min (ref >90)
Glucose, Bld: 119 mg/dL — ABNORMAL HIGH (ref 70–99)
Sodium: 137 mEq/L (ref 135–145)

## 2012-06-19 LAB — POCT I-STAT TROPONIN I: Troponin i, poc: 0 ng/mL (ref 0.00–0.08)

## 2012-06-19 MED ORDER — ATORVASTATIN CALCIUM 40 MG PO TABS
40.0000 mg | ORAL_TABLET | Freq: Every day | ORAL | Status: DC
  Administered 2012-06-20 – 2012-06-22 (×3): 40 mg via ORAL
  Filled 2012-06-19 (×3): qty 1

## 2012-06-19 MED ORDER — ONDANSETRON HCL 4 MG/2ML IJ SOLN: 4.0000 mg | Freq: Four times a day (QID) | INTRAMUSCULAR | Status: DC | PRN

## 2012-06-19 MED ORDER — GABAPENTIN 300 MG PO CAPS
600.0000 mg | ORAL_CAPSULE | Freq: Every day | ORAL | Status: DC
  Administered 2012-06-19 – 2012-06-21 (×4): 600 mg via ORAL
  Filled 2012-06-19 (×6): qty 2

## 2012-06-19 MED ORDER — ASPIRIN EC 81 MG PO TBEC
81.0000 mg | DELAYED_RELEASE_TABLET | Freq: Every day | ORAL | Status: DC
  Administered 2012-06-20: 81 mg via ORAL
  Filled 2012-06-19: qty 1

## 2012-06-19 MED ORDER — SODIUM CHLORIDE 0.9 % IJ SOLN
3.0000 mL | Freq: Two times a day (BID) | INTRAMUSCULAR | Status: DC
  Administered 2012-06-19: 3 mL via INTRAVENOUS

## 2012-06-19 MED ORDER — SODIUM CHLORIDE 0.9 % IV SOLN
Freq: Once | INTRAVENOUS | Status: AC
  Administered 2012-06-19: 19:00:00 via INTRAVENOUS

## 2012-06-19 MED ORDER — NITROGLYCERIN 0.4 MG SL SUBL: 0.4000 mg | SUBLINGUAL_TABLET | SUBLINGUAL | Status: DC | PRN

## 2012-06-19 MED ORDER — FENOFIBRATE 54 MG PO TABS
54.0000 mg | ORAL_TABLET | Freq: Every day | ORAL | Status: DC
Start: 2012-06-20 — End: 2012-06-22
  Administered 2012-06-20 – 2012-06-22 (×3): 54 mg via ORAL
  Filled 2012-06-19 (×3): qty 1

## 2012-06-19 MED ORDER — GI COCKTAIL ~~LOC~~
30.0000 mL | Freq: Once | ORAL | Status: AC
  Administered 2012-06-19: 30 mL via ORAL
  Filled 2012-06-19: qty 30

## 2012-06-19 MED ORDER — SODIUM CHLORIDE 0.9 % IV SOLN
250.0000 mL | INTRAVENOUS | Status: DC | PRN
  Administered 2012-06-20: 10 mL via INTRAVENOUS

## 2012-06-19 MED ORDER — HYDROCODONE-ACETAMINOPHEN 7.5-325 MG PO TABS
1.0000 | ORAL_TABLET | Freq: Three times a day (TID) | ORAL | Status: DC | PRN
  Administered 2012-06-21: 1 via ORAL
  Filled 2012-06-19: qty 1

## 2012-06-19 MED ORDER — METOPROLOL SUCCINATE ER 25 MG PO TB24
25.0000 mg | ORAL_TABLET | Freq: Every day | ORAL | Status: DC
  Administered 2012-06-21 – 2012-06-22 (×2): 25 mg via ORAL
  Filled 2012-06-19 (×3): qty 1

## 2012-06-19 MED ORDER — PANTOPRAZOLE SODIUM 40 MG PO TBEC
40.0000 mg | DELAYED_RELEASE_TABLET | Freq: Every day | ORAL | Status: DC
  Administered 2012-06-20 – 2012-06-22 (×3): 40 mg via ORAL
  Filled 2012-06-19 (×3): qty 1

## 2012-06-19 MED ORDER — ACETAMINOPHEN 325 MG PO TABS
650.0000 mg | ORAL_TABLET | ORAL | Status: DC | PRN
  Administered 2012-06-21: 650 mg via ORAL
  Filled 2012-06-19: qty 2

## 2012-06-19 MED ORDER — FUROSEMIDE 20 MG PO TABS
20.0000 mg | ORAL_TABLET | Freq: Every day | ORAL | Status: DC
  Administered 2012-06-20 – 2012-06-22 (×2): 20 mg via ORAL
  Filled 2012-06-19 (×3): qty 1

## 2012-06-19 MED ORDER — ASPIRIN 81 MG PO CHEW: 162.0000 mg | CHEWABLE_TABLET | Freq: Once | ORAL | Status: DC

## 2012-06-19 MED ORDER — SODIUM CHLORIDE 0.9 % IJ SOLN: 3.0000 mL | INTRAMUSCULAR | Status: DC | PRN

## 2012-06-19 MED ORDER — HEPARIN (PORCINE) IN NACL 100-0.45 UNIT/ML-% IJ SOLN
1150.0000 [IU]/h | INTRAMUSCULAR | Status: DC
  Administered 2012-06-20: 1000 [IU]/h via INTRAVENOUS
  Filled 2012-06-19 (×4): qty 250

## 2012-06-19 MED ORDER — HEPARIN BOLUS VIA INFUSION
4000.0000 [IU] | Freq: Once | INTRAVENOUS | Status: AC
  Administered 2012-06-20: 4000 [IU] via INTRAVENOUS

## 2012-06-19 NOTE — ED Provider Notes (Addendum)
History     CSN: [id number]  Arrival date & time 06/19/12  1733   First MD Initiated Contact with Patient 06/19/12 1734      Chief Complaint  Patient presents with  . Chest Pain    (Consider location/radiation/quality/duration/timing/severity/associated sxs/prior treatment) HPI Comments: Pt comes in with cc of chest pain. Pt has hx of CAD, s/p CABG 3 years ago and a recent MPI that showed no reversible ischemia just last month. Pt reports having chest pain on the left side, pressure like for the past 3 days. The pain is constant, and radiating to the left elbow. There is no associated n/v/dib/diophoresis. Pt got nitro x 2 enroute-  And his discomfort responded. He is still having mild pain.  Patient is a 62 y.o. male presenting with chest pain. The history is provided by the patient.  Chest Pain Associated symptoms: no cough, no dizziness, no fever, no headache and no shortness of breath     Past Medical History  Diagnosis Date  . Hypertension   . Stroke     h/o pontine CVA  . Arthritis   . Leg pain     with walking  . Weight loss   . AAA (abdominal aortic aneurysm)     s/p repair 6/11  . Hyperlipidemia   . CAD (coronary artery disease)     s/p CABG 2/12:  LIMA to LAD, SVG to diagonal-1, SVG to ramus intermedius,, SVG to AM (Dr. Dorris Fetch) ;  b.  Myoview 10/13:  inf infarct with very mild peri-infarct ischemia, EF 49%, inf HK   . Carotid stenosis     a.  dopplers 8/12: LICA 50-69% => repeat 11/2011  . PAD (peripheral artery disease)     ABIs 4/12:  R 0.88, L 0.92; R SFA 40%, L CFA 50%, L SFA 50-60%  . COPD (chronic obstructive pulmonary disease)     Past Surgical History  Procedure Laterality Date  . Hip surgery  40 years ago    left hip removal and pinning  . Abdominal aortic aneurysm repair  10-03-2009  . Exploratory laparotomy  09/2009    ligation of lumbar arteries  . Abdominal aortic aneurysm repair    . Coronary artery bypass graft      LIMA to the LAD, SVG  to first diagonal, SVG to ramus intermediate, SVG to acute marginal. EF 50%. 2/12    Family History  Problem Relation Age of Onset  . Alcohol abuse Father   . Cancer Sister     History  Substance Use Topics  . Smoking status: Former Smoker -- 1.00 packs/day for 30 years    Types: Cigarettes    Quit date: 01/25/2010  . Smokeless tobacco: Not on file  . Alcohol Use: No     Comment: former drinker      Review of Systems  Constitutional: Negative for fever, chills and activity change.  HENT: Negative for neck pain.   Eyes: Negative for visual disturbance.  Respiratory: Negative for cough, chest tightness and shortness of breath.   Cardiovascular: Positive for chest pain.  Gastrointestinal: Negative for abdominal distention.  Genitourinary: Negative for dysuria, enuresis and difficulty urinating.  Musculoskeletal: Negative for arthralgias.  Neurological: Negative for dizziness, light-headedness and headaches.  Psychiatric/Behavioral: Negative for confusion.    Allergies  Review of patient's allergies indicates no known allergies.  Home Medications   Current Outpatient Rx  Name  Route  Sig  Dispense  Refill  . aspirin EC 81 MG tablet  Oral   Take 81 mg by mouth daily.         Marland Kitchen atorvastatin (LIPITOR) 40 MG tablet   Oral   Take 1 tablet (40 mg total) by mouth daily.   30 tablet   11   . esomeprazole (NEXIUM) 40 MG capsule   Oral   Take 40 mg by mouth daily as needed. For acid reflux         . furosemide (LASIX) 20 MG tablet   Oral   Take 1 tablet (20 mg total) by mouth daily.   30 tablet   11   . gabapentin (NEURONTIN) 300 MG capsule   Oral   Take 600 mg by mouth at bedtime.         Marland Kitchen HYDROcodone-acetaminophen (NORCO) 7.5-325 MG per tablet   Oral   Take 1 tablet by mouth every 8 (eight) hours as needed for pain. Take 1/2 tablet every 8 hours prn pain.         . metoprolol succinate (TOPROL-XL) 25 MG 24 hr tablet   Oral   Take 1 tablet (25 mg  total) by mouth daily.   30 tablet   12   . nitroGLYCERIN (NITROSTAT) 0.4 MG SL tablet   Sublingual   Place 1 tablet (0.4 mg total) under the tongue every 5 (five) minutes as needed for chest pain.   25 tablet   3   . nystatin ointment (MYCOSTATIN)   Topical   Apply 1 application topically 2 (two) times daily. For 2 weeks , filled 03/04/2012         . oxyCODONE-acetaminophen (PERCOCET) 10-325 MG per tablet   Oral   Take 0.5 tablets by mouth every 8 (eight) hours as needed. For pain         . fenofibrate 54 MG tablet                 BP 138/89  Pulse 71  Temp(Src) 98.5 F (36.9 C) (Oral)  Resp 18  SpO2 97%  Physical Exam  Nursing note and vitals reviewed. Constitutional: He is oriented to person, place, and time. He appears well-developed.  HENT:  Head: Normocephalic and atraumatic.  Eyes: Conjunctivae and EOM are normal. Pupils are equal, round, and reactive to light.  Neck: Normal range of motion. Neck supple.  Cardiovascular: Normal rate and regular rhythm.   Pulmonary/Chest: Effort normal and breath sounds normal.  Abdominal: Soft. Bowel sounds are normal. He exhibits no distension. There is no tenderness. There is no rebound and no guarding.  Neurological: He is alert and oriented to person, place, and time.  Skin: Skin is warm.    ED Course  Procedures (including critical care time)  Labs Reviewed  CBC WITH DIFFERENTIAL  BASIC METABOLIC PANEL  URINALYSIS, ROUTINE W REFLEX MICROSCOPIC   No results found.   No diagnosis found.    MDM  Differential diagnosis includes: ACS syndrome CHF exacerbation Valvular disorder Myocarditis Pericarditis Pericardial effusion Pneumonia Pleural effusion Pulmonary edema PE Anemia Musculoskeletal pain   Date: 06/19/2012  Rate: 70  Rhythm: normal sinus rhythm  QRS Axis: normal  Intervals: normal  ST/T Wave abnormalities: normal  Conduction Disutrbances: none  Narrative Interpretation:  unremarkable  Pt comes in with cc of chest pain. Has CAD. The pain is typical, however, EKG shows no acute findings. His pain also responded to nitro - and his BP dropped to 90 SBP per RN, so we wilget posterior EKG as well. Recent MPI was negative, and  rest of the hx and exam are benign.  With this typcial appearing pain and known CAD and BP drop - we will get Cards consult. Pt has hernia repair surgery coming up soon as well - so it will beneficia for the surgery team as well.    Derwood Kaplan, MD 06/19/12 1945   Date: 06/19/2012  Rate: 62  Rhythm: normal sinus rhythm  QRS Axis: normal  Intervals: normal  ST/T Wave abnormalities: normal  Conduction Disutrbances: none  Narrative Interpretation: unremarkable      Derwood Kaplan, MD 06/19/12 1946

## 2012-06-19 NOTE — ED Notes (Signed)
Report given to John RN.

## 2012-06-19 NOTE — ED Notes (Signed)
Pt stood at bedside to provide urine sample.

## 2012-06-19 NOTE — ED Notes (Signed)
Consulting MD in room with pt

## 2012-06-19 NOTE — Progress Notes (Signed)
ANTICOAGULATION CONSULT NOTE - Initial Consult  Pharmacy Consult for Heparin Indication: chest pain/ACS  No Known Allergies  Patient Measurements: Height: 5' 6.93" (170 cm) Weight: 181 lb (82.1 kg) IBW/kg (Calculated) : 65.94 Heparin Dosing Weight: 80kg  Vital Signs: Temp: 98.5 F (36.9 C) (02/23 1901) Temp src: Oral (02/23 1901) BP: 137/79 mmHg (02/23 2030) Pulse Rate: 65 (02/23 2030)  Labs:  Recent Labs  06/19/12 1800 06/19/12 1856  HGB 16.6  --   HCT 48.5  --   PLT 252  --   CREATININE  --  0.76    Estimated Creatinine Clearance: 96.8 ml/min (by C-G formula based on Cr of 0.76).   Medical History: Past Medical History  Diagnosis Date  . Hypertension   . Stroke     h/o pontine CVA  . AAA (abdominal aortic aneurysm)     s/p repair 6/11  . Hyperlipidemia   . CAD (coronary artery disease)     s/p CABG 2/12:  LIMA to LAD, SVG to diagonal-1, SVG to ramus intermedius,, SVG to AM (Dr. Dorris Fetch) ;  b.  Myoview 10/13:  inf infarct with very mild peri-infarct ischemia, EF 49%, inf HK   . Carotid stenosis     a.  dopplers 8/12: LICA 50-69% => repeat 11/2011  . PAD (peripheral artery disease)     ABIs 4/12:  R 0.88, L 0.92; R SFA 40%, L CFA 50%, L SFA 50-60%  . COPD (chronic obstructive pulmonary disease)    Assessment: 62 year old male presenting with chest pain. Patient has history of significant vascular disease including previous cabg and pontine cva. Orders to initiate IV heparin.   Goal of Therapy:  Heparin level 0.3-0.7 units/ml Monitor platelets by anticoagulation protocol: Yes   Plan:  Give 4000 units bolus x 1 Start heparin infusion at 1000 units/hr Check anti-Xa level in 6 hours and daily while on heparin Continue to monitor H&H and platelets  Severiano Gilbert 06/19/2012,10:15 PM

## 2012-06-19 NOTE — ED Notes (Signed)
Attempted to call report to floor.  Was told RN would return my call shortly.   

## 2012-06-19 NOTE — ED Notes (Signed)
Per EMS:  Pt reports that pt has had CP, (L) arm numbness x 3 days.  BLE edema present.  Denies SOB, N/V.  Pt reports being lightheaded.  324 asa, 2SL nitro, 170s initially-99 systolic after 2SL nitro.  78 NSR.

## 2012-06-19 NOTE — ED Notes (Signed)
Pt given urinal for urine sample 

## 2012-06-19 NOTE — H&P (Addendum)
History and Physical   Admit date: 06/19/2012 Name:  Chris Payne Medical record number: [id number] DOB/Age:  [birth year]  62 y.o. male  Referring Physician:   [facility]  Primary Cardiologist:  Dr. Angelina Sheriff  Primary Physician:  Dr. Dow Adolph  Chief complaint/reason for admission: Chest pain  HPI:  This 62 year old male has a history of significant vascular disease. He had a previous abdominal aneurysm repair complicated by bleeding requiring ligation of the lumbar arteries. He also had a pontine stroke complicating his infarction. Following this he had unstable angina and  was demonstrated to have severe left main disease and an occluded right coronary artery and had bypass grafting by Dr. Dorris Fetch.  He changed care to Dr. Antoine Poche last June after having been seen by Dr. Allyson Sabal. In September he had a [place] Myoview that showed a previous inferior infarction with a small focus of peri-infarction ischemia. He developed an inguinal hernia and was recently seen and was given clearance for the operation.   He has chronic leg numbness and paresthesias as well as significant back pain and takes narcotics. He presented to the emergency room with a somewhat vague 4 day history of chest pain. He came this evening because he became somewhat dizzy and thought he might pass out and was given 2 nitroglycerin that resulted in significant relief of his pain. He also complained of some pain in his left inner arm and noted that if he held his arm in a certain position that it would get better. He also complained of some neck pain. His description of the chest pain was vague but it was not pleuritic and was not made worse with food. and he stated that it was fairly continuous over the past 3-4 days but was relieved by nitroglycerin when he arrived here. He denies PND, orthopnea, or syncope. He continues to smoke.  He mentions he is supposed to have inguinal hernia surgery on Feb  27.   Past Medical History  Diagnosis Date  . Hypertension   . Stroke     h/o pontine CVA  . AAA (abdominal aortic aneurysm)     s/p repair 6/11  . Hyperlipidemia   . CAD (coronary artery disease)     s/p CABG 2/12:  LIMA to LAD, SVG to diagonal-1, SVG to ramus intermedius,, SVG to AM (Dr. Dorris Fetch) ;  b.  Myoview 10/13:  inf infarct with very mild peri-infarct ischemia, EF 49%, inf HK   . Carotid stenosis     a.  dopplers 8/12: LICA 50-69% => repeat 11/2011  . PAD (peripheral artery disease)     ABIs 4/12:  R 0.88, L 0.92; R SFA 40%, L CFA 50%, L SFA 50-60%  . COPD (chronic obstructive pulmonary disease)       Past Surgical History  Procedure Laterality Date  . Hip surgery  40 years ago    left hip removal and pinning  . Abdominal aortic aneurysm repair  10-03-2009  . Exploratory laparotomy  09/2009    ligation of lumbar arteries  . Coronary artery bypass graft      LIMA to the LAD, SVG to first diagonal, SVG to ramus intermediate, SVG to acute marginal. EF 50%. 2/12  .  Allergies: has No Known Allergies.   Medications: Prior to Admission medications   Medication Sig Start Date End Date Taking? Authorizing Provider  aspirin EC 81 MG tablet Take 81 mg by mouth daily.   Yes Historical Provider, MD  atorvastatin (  LIPITOR) 40 MG tablet Take 1 tablet (40 mg total) by mouth daily. 05/10/12 05/10/13 Yes Rollene Rotunda, MD  esomeprazole (NEXIUM) 40 MG capsule Take 40 mg by mouth daily as needed. For acid reflux   Yes Historical Provider, MD  furosemide (LASIX) 20 MG tablet Take 1 tablet (20 mg total) by mouth daily. 10/23/11 10/22/12 Yes Rollene Rotunda, MD  gabapentin (NEURONTIN) 300 MG capsule Take 600 mg by mouth at bedtime.   Yes Historical Provider, MD  HYDROcodone-acetaminophen (NORCO) 7.5-325 MG per tablet Take 1 tablet by mouth every 8 (eight) hours as needed for pain.  05/12/12  Yes Maurice March, MD  metoprolol succinate (TOPROL-XL) 25 MG 24 hr tablet Take 1 tablet (25 mg  total) by mouth daily. 02/02/12  Yes Rollene Rotunda, MD  nitroGLYCERIN (NITROSTAT) 0.4 MG SL tablet Place 1 tablet (0.4 mg total) under the tongue every 5 (five) minutes as needed for chest pain. 01/12/12  Yes Beatrice Lecher, PA  nystatin ointment (MYCOSTATIN) Apply 1 application topically daily as needed. For leg area   Yes Historical Provider, MD  oxyCODONE-acetaminophen (PERCOCET) 10-325 MG per tablet Take 0.5 tablets by mouth every 8 (eight) hours as needed. For pain   Yes Historical Provider, MD  fenofibrate 54 MG tablet Take 54 mg by mouth daily.    Historical Provider, MD   Family History:  Family Status  Relation Status Death Age  . Mother Deceased 56    natural causes  . Father Deceased 92    alcoholism  . Sister Deceased   . Sister Deceased     Social History: Has resumed smoking less than one pack per day. His smoking use included Cigarettes. He has a 30 pack-year smoking history. He has never used smokeless tobacco. He reports that he does not drink alcohol or use illicit drugs.   History   Social History Narrative   Retired Metallurgist.  Lives with wife and stepson. Married x 13 years.     Review of Systems: He has significant pain involving his low back and states he might need to have an operation he also has significant numbness and paresthesias of his feet. He has occasional abdominal discomfort and excess gas. He has some urinary frequency. Other than as noted above, the remainder of the review of systems is normal  Physical Exam: BP 137/79  Pulse 65  Temp(Src) 98.5 F (36.9 C) (Oral)  Resp 17  SpO2 98%  General appearance: alert, cooperative, appears older than stated age and no distress Head: Normocephalic, without obvious abnormality, atraumatic Eyes: conjunctivae/corneas clear. PERRL, EOM's intact. Fundi not examined, slight lesion on right eyelid  Throat: Lower teeth absent Neck: no adenopathy, no JVD, supple, symmetrical, trachea midline and Left  carotid bruit noted Lungs: clear to auscultation bilaterally Heart: regular rate and rhythm, S1, S2 normal, no murmur, click, rub or gallop Abdomen: soft, non-tender; bowel sounds normal; no masses,  no organomegaly Extremities: Peripheral pulses are 1-2+, scars present in right and left groin as well as previous saphenous harvesting scars in the right leg Pulses: Femoral pulses 2+ peripheral pulses are 1+ Skin: Skin color, texture, turgor normal. No rashes or lesions Neurologic: Grossly normal  Labs: CBC  Recent Labs  06/19/12 1800  WBC 6.0  RBC 5.02  HGB 16.6  HCT 48.5  PLT 252  MCV 96.6  MCH 33.1  MCHC 34.2  RDW 12.4  LYMPHSABS 2.5  MONOABS 0.6  EOSABS 0.1  BASOSABS 0.0   CMP  Recent Labs  06/19/12 1856  NA 137  K 3.9  CL 99  CO2 29  GLUCOSE 119*  BUN 9  CREATININE 0.76  CALCIUM 9.2  GFRNONAA >90  GFRAA >90    Troponin (Point of Care Test)  Recent Labs  06/19/12 1815  TROPIPOC 0.00   Thyroid  Lab Results  Component Value Date   TSH 0.945 08/10/2011    EKG: Sinus rhythm, mild ST depression noted in V2 and V3  Radiology: Mild basilar atelectasis   IMPRESSIONS: 1. Prolonged chest and left arm pain with atypical features in a patient with a previous abnormal Myoview test and previous bypass grafting 2. Coronary artery disease with previous ostial left main disease and three-vessel disease with previous bypass grafting 3. Previous pontine cerebrovascular accident 4. Significant carotid artery disease 5. Previous inguinal hernia in need of repair 6. Hyperlipidemia 7. Hypertension 8. Previous abdominal aneurysm repair  PLAN: His pain is atypical but he had some minor ST depression in V2 and V3. He is scheduled to have a hernia repair on February 27. Because of his recent symptoms and prior vascular history he may require cardiac catheterization but will defer this to primary team. Check serial cardiac enzymes and EKG. Initiate intravenous  heparin.  Signed: Darden Palmer MD [facility]  06/19/2012, 9:49 PM

## 2012-06-20 DIAGNOSIS — I200 Unstable angina: Secondary | ICD-10-CM

## 2012-06-20 LAB — HEPARIN LEVEL (UNFRACTIONATED): Heparin Unfractionated: 0.32 IU/mL (ref 0.30–0.70)

## 2012-06-20 LAB — CBC
MCH: 33.7 pg (ref 26.0–34.0)
MCHC: 34.9 g/dL (ref 30.0–36.0)
MCV: 96.5 fL (ref 78.0–100.0)
Platelets: 215 10*3/uL (ref 150–400)

## 2012-06-20 LAB — TROPONIN I: Troponin I: <0.3 ng/mL (ref <0.30)

## 2012-06-20 MED ORDER — SODIUM CHLORIDE 0.9 % IV SOLN: 250.0000 mL | INTRAVENOUS | Status: DC | PRN

## 2012-06-20 MED ORDER — SODIUM CHLORIDE 0.9 % IJ SOLN: 3.0000 mL | Freq: Two times a day (BID) | INTRAMUSCULAR | Status: DC

## 2012-06-20 MED ORDER — ASPIRIN EC 81 MG PO TBEC
81.0000 mg | DELAYED_RELEASE_TABLET | Freq: Every day | ORAL | Status: DC
  Administered 2012-06-22: 81 mg via ORAL
  Filled 2012-06-20: qty 1

## 2012-06-20 MED ORDER — CEFAZOLIN SODIUM-DEXTROSE 2-3 GM-% IV SOLR
2.0000 g | INTRAVENOUS | Status: DC
  Filled 2012-06-20: qty 50

## 2012-06-20 MED ORDER — ASPIRIN 81 MG PO CHEW
324.0000 mg | CHEWABLE_TABLET | ORAL | Status: AC
  Administered 2012-06-21: 324 mg via ORAL
  Filled 2012-06-20: qty 4

## 2012-06-20 MED ORDER — SODIUM CHLORIDE 0.9 % IV SOLN: INTRAVENOUS | Status: DC

## 2012-06-20 MED ORDER — POLYETHYLENE GLYCOL 3350 17 G PO PACK
17.0000 g | PACK | Freq: Every day | ORAL | Status: DC
  Administered 2012-06-20 – 2012-06-21 (×2): 17 g via ORAL
  Filled 2012-06-20 (×3): qty 1

## 2012-06-20 MED ORDER — SODIUM CHLORIDE 0.9 % IJ SOLN: 3.0000 mL | INTRAMUSCULAR | Status: DC | PRN

## 2012-06-20 MED ORDER — DIAZEPAM 5 MG PO TABS
5.0000 mg | ORAL_TABLET | ORAL | Status: AC
  Administered 2012-06-21: 5 mg via ORAL
  Filled 2012-06-20: qty 1

## 2012-06-20 MED ORDER — SODIUM CHLORIDE 0.9 % IV SOLN
1.0000 mL/kg/h | INTRAVENOUS | Status: DC
Start: 2012-06-21 — End: 2012-06-21

## 2012-06-20 NOTE — Progress Notes (Signed)
ANTICOAGULATION CONSULT NOTE - Follow Up Consult  Pharmacy Consult for Heparin Indication: chest pain/ACS  No Known Allergies  Labs:  Recent Labs  06/19/12 1800 06/19/12 1856 06/19/12 2153 06/19/12 2202 06/20/12 0700 06/20/12 1019  HGB 16.6  --   --   --   --  15.6  HCT 48.5  --   --   --   --  44.7  PLT 252  --   --   --   --  215  APTT  --   --   --  35  --   --   LABPROT  --   --   --  14.2  --   --   INR  --   --   --  1.11  --   --   HEPARINUNFRC  --   --   --   --   --  0.32  CREATININE  --  0.76  --  0.68  --   --   TROPONINI  --   --  <0.30  --  <0.30  --     Estimated Creatinine Clearance: 95.8 ml/min (by C-G formula based on Cr of 0.68).  Assessment: 62 year old admitted with CP with plans for cath 06/21/12. Heparin level therapeutic on low end of range  Goal of Therapy:  Heparin level 0.3-0.7 units/ml Monitor platelets by anticoagulation protocol: Yes   Plan:  1) Increase heparin to 1150 units / hr 2) Follow up AM 3) Cath planned for 10:30 am 06/21/12  Thank you. Okey Regal, PharmD [phone]  06/20/2012,11:56 AM

## 2012-06-20 NOTE — Progress Notes (Addendum)
Spoke with [facility], pt to get R/L heart cath from Right groin on 2/25 at 10:30 am. Pt on board and orders written.

## 2012-06-20 NOTE — Progress Notes (Signed)
 SUBJECTIVE:  No chest pain at present.  Constipated.     PHYSICAL EXAM Filed Vitals:   06/19/12 2130 06/19/12 2145 06/19/12 2200 06/19/12 2328  BP: 129/85 140/82 131/75 155/83  Pulse: 63 66 70 59  Temp:    99.1 F (37.3 C)  TempSrc:      Resp: 18 18 17 18  Height:    5' 7" (1.702 m)  Weight:    176 lb 12.8 oz (80.196 kg)  SpO2: 96% 96% 96% 94%   General:  No distress HEENT:  PERRL Lungs:  Decreased breath sounds without wheezing or crackles. Heart:  RRR Abdomen:  Positive bowel sounds, no rebound no guarding Extremities:  No edema Neuro:  Nonfocal  LABS: Lab Results  Component Value Date   CKTOTAL 29 06/21/2010   CKMB 0.6 06/21/2010   TROPONINI <0.30 06/20/2012   Results for orders placed during the hospital encounter of 06/19/12 (from the past 24 hour(s))  CBC WITH DIFFERENTIAL     Status: None   Collection Time    06/19/12  6:00 PM      Result Value Range   WBC 6.0  4.0 - 10.5 K/uL   RBC 5.02  4.22 - 5.81 MIL/uL   Hemoglobin 16.6  13.0 - 17.0 g/dL   HCT 48.5  39.0 - 52.0 %   MCV 96.6  78.0 - 100.0 fL   MCH 33.1  26.0 - 34.0 pg   MCHC 34.2  30.0 - 36.0 g/dL   RDW 12.4  11.5 - 15.5 %   Platelets 252  150 - 400 K/uL   Neutrophils Relative 47  43 - 77 %   Neutro Abs 2.8  1.7 - 7.7 K/uL   Lymphocytes Relative 41  12 - 46 %   Lymphs Abs 2.5  0.7 - 4.0 K/uL   Monocytes Relative 10  3 - 12 %   Monocytes Absolute 0.6  0.1 - 1.0 K/uL   Eosinophils Relative 2  0 - 5 %   Eosinophils Absolute 0.1  0.0 - 0.7 K/uL   Basophils Relative 1  0 - 1 %   Basophils Absolute 0.0  0.0 - 0.1 K/uL  POCT I-STAT TROPONIN I     Status: None   Collection Time    06/19/12  6:15 PM      Result Value Range   Troponin i, poc 0.00  0.00 - 0.08 ng/mL   Comment 3           BASIC METABOLIC PANEL     Status: Abnormal   Collection Time    06/19/12  6:56 PM      Result Value Range   Sodium 137  135 - 145 mEq/L   Potassium 3.9  3.5 - 5.1 mEq/L   Chloride 99  96 - 112 mEq/L   CO2 29  19 -  32 mEq/L   Glucose, Bld 119 (*) 70 - 99 mg/dL   BUN 9  6 - 23 mg/dL   Creatinine, Ser 0.76  0.50 - 1.35 mg/dL   Calcium 9.2  8.4 - 10.5 mg/dL   GFR calc non Af Amer >90  >90 mL/min   GFR calc Af Amer >90  >90 mL/min  URINALYSIS, ROUTINE W REFLEX MICROSCOPIC     Status: None   Collection Time    06/19/12  8:18 PM      Result Value Range   Color, Urine YELLOW  YELLOW   APPearance CLEAR  CLEAR   Specific   Gravity, Urine 1.014  1.005 - 1.030   pH 5.5  5.0 - 8.0   Glucose, UA NEGATIVE  NEGATIVE mg/dL   Hgb urine dipstick NEGATIVE  NEGATIVE   Bilirubin Urine NEGATIVE  NEGATIVE   Ketones, ur NEGATIVE  NEGATIVE mg/dL   Protein, ur NEGATIVE  NEGATIVE mg/dL   Urobilinogen, UA 1.0  0.0 - 1.0 mg/dL   Nitrite NEGATIVE  NEGATIVE   Leukocytes, UA NEGATIVE  NEGATIVE  TROPONIN I     Status: None   Collection Time    06/19/12  9:53 PM      Result Value Range   Troponin I <0.30  <0.30 ng/mL  PROTIME-INR     Status: None   Collection Time    06/19/12 10:02 PM      Result Value Range   Prothrombin Time 14.2  11.6 - 15.2 seconds   INR 1.11  0.00 - 1.49  APTT     Status: None   Collection Time    06/19/12 10:02 PM      Result Value Range   aPTT 35  24 - 37 seconds  COMPREHENSIVE METABOLIC PANEL     Status: Abnormal   Collection Time    06/19/12 10:02 PM      Result Value Range   Sodium 138  135 - 145 mEq/L   Potassium 3.6  3.5 - 5.1 mEq/L   Chloride 102  96 - 112 mEq/L   CO2 25  19 - 32 mEq/L   Glucose, Bld 108 (*) 70 - 99 mg/dL   BUN 8  6 - 23 mg/dL   Creatinine, Ser 0.68  0.50 - 1.35 mg/dL   Calcium 8.9  8.4 - 10.5 mg/dL   Total Protein 7.5  6.0 - 8.3 g/dL   Albumin 3.4 (*) 3.5 - 5.2 g/dL   AST 20  0 - 37 U/L   ALT 19  0 - 53 U/L   Alkaline Phosphatase 91  39 - 117 U/L   Total Bilirubin 0.4  0.3 - 1.2 mg/dL   GFR calc non Af Amer >90  >90 mL/min   GFR calc Af Amer >90  >90 mL/min  TROPONIN I     Status: None   Collection Time    06/20/12  7:00 AM      Result Value Range    Troponin I <0.30  <0.30 ng/mL    Intake/Output Summary (Last 24 hours) at 06/20/12 0847 Last data filed at 06/20/12 0600  Gross per 24 hour  Intake 1235.34 ml  Output    400 ml  Net 835.34 ml    EKG:  NSR, no acute ST T wave changes.    ASSESSMENT AND PLAN:  Chest pain:  Given his recurrent pain similar to his previous angina and pending hernia surgery he needs to have a cardia cath. The patient understands that risks included but are not limited to stroke (1 in 1000), death (1 in 1000), kidney failure [usually temporary] (1 in 500), bleeding (1 in 200), allergic reaction [possibly serious] (1 in 200).  The patient understands and agrees to proceed.  We will be unable to do this today so he will be done in the AM.   CAD:  As above.      Oline Belk 06/20/2012 8:47 AM   

## 2012-06-21 ENCOUNTER — Encounter (HOSPITAL_COMMUNITY)
Admission: EM | Disposition: A | Discharge status: Home / Self Care | Payer: Self-pay | Source: Home / Self Care | Attending: Emergency Medicine

## 2012-06-21 ENCOUNTER — Encounter: Payer: Self-pay | Admitting: Family Medicine

## 2012-06-21 ENCOUNTER — Encounter (HOSPITAL_COMMUNITY): Payer: Self-pay | Admitting: *Deleted

## 2012-06-21 DIAGNOSIS — I739 Peripheral vascular disease, unspecified: Secondary | ICD-10-CM

## 2012-06-21 DIAGNOSIS — I2510 Atherosclerotic heart disease of native coronary artery without angina pectoris: Secondary | ICD-10-CM

## 2012-06-21 HISTORY — PX: LEFT HEART CATHETERIZATION WITH CORONARY/GRAFT ANGIOGRAM: SHX5450

## 2012-06-21 LAB — CBC
Hemoglobin: 15 g/dL (ref 13.0–17.0)
MCH: 32.9 pg (ref 26.0–34.0)
RBC: 4.56 MIL/uL (ref 4.22–5.81)

## 2012-06-21 LAB — HEPARIN LEVEL (UNFRACTIONATED): Heparin Unfractionated: 0.58 IU/mL (ref 0.30–0.70)

## 2012-06-21 SURGERY — LEFT HEART CATHETERIZATION WITH CORONARY/GRAFT ANGIOGRAM

## 2012-06-21 MED ORDER — ZOLPIDEM TARTRATE 5 MG PO TABS: 5.0000 mg | ORAL_TABLET | Freq: Every evening | ORAL | Status: DC | PRN

## 2012-06-21 MED ORDER — MIDAZOLAM HCL 2 MG/2ML IJ SOLN
INTRAMUSCULAR | Status: AC
  Filled 2012-06-21: qty 2

## 2012-06-21 MED ORDER — SODIUM CHLORIDE 0.9 % IV SOLN: INTRAVENOUS | Status: AC

## 2012-06-21 MED ORDER — LIDOCAINE HCL (PF) 1 % IJ SOLN
INTRAMUSCULAR | Status: AC
  Filled 2012-06-21: qty 30

## 2012-06-21 MED ORDER — FENTANYL CITRATE 0.05 MG/ML IJ SOLN
INTRAMUSCULAR | Status: AC
  Filled 2012-06-21: qty 2

## 2012-06-21 MED ORDER — HEPARIN (PORCINE) IN NACL 2-0.9 UNIT/ML-% IJ SOLN
INTRAMUSCULAR | Status: AC
  Filled 2012-06-21: qty 1000

## 2012-06-21 MED ORDER — ALUM & MAG HYDROXIDE-SIMETH 200-200-20 MG/5ML PO SUSP
30.0000 mL | ORAL | Status: DC | PRN
  Administered 2012-06-21: 30 mL via ORAL
  Filled 2012-06-21: qty 30

## 2012-06-21 MED ORDER — VERAPAMIL HCL 2.5 MG/ML IV SOLN
INTRAVENOUS | Status: AC
  Filled 2012-06-21: qty 2

## 2012-06-21 MED ORDER — HEPARIN SODIUM (PORCINE) 1000 UNIT/ML IJ SOLN
INTRAMUSCULAR | Status: AC
  Filled 2012-06-21: qty 1

## 2012-06-21 MED ORDER — HEPARIN SODIUM (PORCINE) 5000 UNIT/ML IJ SOLN
5000.0000 [IU] | Freq: Three times a day (TID) | INTRAMUSCULAR | Status: DC
  Administered 2012-06-21 – 2012-06-22 (×2): 5000 [IU] via SUBCUTANEOUS
  Filled 2012-06-21 (×5): qty 1

## 2012-06-21 NOTE — Progress Notes (Signed)
Utilization Review Completed Adelma Bowdoin J. Naika Noto, RN, BSN, NCM [phone]  

## 2012-06-21 NOTE — H&P (View-Only) (Signed)
SUBJECTIVE:  No chest pain at present.  Constipated.     PHYSICAL EXAM Filed Vitals:   06/19/12 2130 06/19/12 2145 06/19/12 2200 06/19/12 2328  BP: 129/85 140/82 131/75 155/83  Pulse: 63 66 70 59  Temp:    99.1 F (37.3 C)  TempSrc:      Resp: 18 18 17 18   Height:    5\' 7"  (1.702 m)  Weight:    176 lb 12.8 oz (80.196 kg)  SpO2: 96% 96% 96% 94%   General:  No distress HEENT:  PERRL Lungs:  Decreased breath sounds without wheezing or crackles. Heart:  RRR Abdomen:  Positive bowel sounds, no rebound no guarding Extremities:  No edema Neuro:  Nonfocal  LABS: Lab Results  Component Value Date   CKTOTAL 29 06/21/2010   CKMB 0.6 06/21/2010   TROPONINI <0.30 06/20/2012   Results for orders placed during the hospital encounter of 06/19/12 (from the past 24 hour(s))  CBC WITH DIFFERENTIAL     Status: None   Collection Time    06/19/12  6:00 PM      Result Value Range   WBC 6.0  4.0 - 10.5 K/uL   RBC 5.02  4.22 - 5.81 MIL/uL   Hemoglobin 16.6  13.0 - 17.0 g/dL   HCT 16.1  09.6 - 04.5 %   MCV 96.6  78.0 - 100.0 fL   MCH 33.1  26.0 - 34.0 pg   MCHC 34.2  30.0 - 36.0 g/dL   RDW 40.9  81.1 - 91.4 %   Platelets 252  150 - 400 K/uL   Neutrophils Relative 47  43 - 77 %   Neutro Abs 2.8  1.7 - 7.7 K/uL   Lymphocytes Relative 41  12 - 46 %   Lymphs Abs 2.5  0.7 - 4.0 K/uL   Monocytes Relative 10  3 - 12 %   Monocytes Absolute 0.6  0.1 - 1.0 K/uL   Eosinophils Relative 2  0 - 5 %   Eosinophils Absolute 0.1  0.0 - 0.7 K/uL   Basophils Relative 1  0 - 1 %   Basophils Absolute 0.0  0.0 - 0.1 K/uL  POCT I-STAT TROPONIN I     Status: None   Collection Time    06/19/12  6:15 PM      Result Value Range   Troponin i, poc 0.00  0.00 - 0.08 ng/mL   Comment 3           BASIC METABOLIC PANEL     Status: Abnormal   Collection Time    06/19/12  6:56 PM      Result Value Range   Sodium 137  135 - 145 mEq/L   Potassium 3.9  3.5 - 5.1 mEq/L   Chloride 99  96 - 112 mEq/L   CO2 29  19 -  32 mEq/L   Glucose, Bld 119 (*) 70 - 99 mg/dL   BUN 9  6 - 23 mg/dL   Creatinine, Ser 7.82  0.50 - 1.35 mg/dL   Calcium 9.2  8.4 - 95.6 mg/dL   GFR calc non Af Amer >90  >90 mL/min   GFR calc Af Amer >90  >90 mL/min  URINALYSIS, ROUTINE W REFLEX MICROSCOPIC     Status: None   Collection Time    06/19/12  8:18 PM      Result Value Range   Color, Urine YELLOW  YELLOW   APPearance CLEAR  CLEAR   Specific  Gravity, Urine 1.014  1.005 - 1.030   pH 5.5  5.0 - 8.0   Glucose, UA NEGATIVE  NEGATIVE mg/dL   Hgb urine dipstick NEGATIVE  NEGATIVE   Bilirubin Urine NEGATIVE  NEGATIVE   Ketones, ur NEGATIVE  NEGATIVE mg/dL   Protein, ur NEGATIVE  NEGATIVE mg/dL   Urobilinogen, UA 1.0  0.0 - 1.0 mg/dL   Nitrite NEGATIVE  NEGATIVE   Leukocytes, UA NEGATIVE  NEGATIVE  TROPONIN I     Status: None   Collection Time    06/19/12  9:53 PM      Result Value Range   Troponin I <0.30  <0.30 ng/mL  PROTIME-INR     Status: None   Collection Time    06/19/12 10:02 PM      Result Value Range   Prothrombin Time 14.2  11.6 - 15.2 seconds   INR 1.11  0.00 - 1.49  APTT     Status: None   Collection Time    06/19/12 10:02 PM      Result Value Range   aPTT 35  24 - 37 seconds  COMPREHENSIVE METABOLIC PANEL     Status: Abnormal   Collection Time    06/19/12 10:02 PM      Result Value Range   Sodium 138  135 - 145 mEq/L   Potassium 3.6  3.5 - 5.1 mEq/L   Chloride 102  96 - 112 mEq/L   CO2 25  19 - 32 mEq/L   Glucose, Bld 108 (*) 70 - 99 mg/dL   BUN 8  6 - 23 mg/dL   Creatinine, Ser 9.60  0.50 - 1.35 mg/dL   Calcium 8.9  8.4 - 45.4 mg/dL   Total Protein 7.5  6.0 - 8.3 g/dL   Albumin 3.4 (*) 3.5 - 5.2 g/dL   AST 20  0 - 37 U/L   ALT 19  0 - 53 U/L   Alkaline Phosphatase 91  39 - 117 U/L   Total Bilirubin 0.4  0.3 - 1.2 mg/dL   GFR calc non Af Amer >90  >90 mL/min   GFR calc Af Amer >90  >90 mL/min  TROPONIN I     Status: None   Collection Time    06/20/12  7:00 AM      Result Value Range    Troponin I <0.30  <0.30 ng/mL    Intake/Output Summary (Last 24 hours) at 06/20/12 0847 Last data filed at 06/20/12 0600  Gross per 24 hour  Intake 1235.34 ml  Output    400 ml  Net 835.34 ml    EKG:  NSR, no acute ST T wave changes.    ASSESSMENT AND PLAN:  Chest pain:  Given his recurrent pain similar to his previous angina and pending hernia surgery he needs to have a cardia cath. The patient understands that risks included but are not limited to stroke (1 in 1000), death (1 in 1000), kidney failure [usually temporary] (1 in 500), bleeding (1 in 200), allergic reaction [possibly serious] (1 in 200).  The patient understands and agrees to proceed.  We will be unable to do this today so he will be done in the AM.   CAD:  As above.      [name] [facility] 06/20/2012 8:47 AM

## 2012-06-21 NOTE — CV Procedure (Signed)
   Cardiac Catheterization Procedure Note  Name: Chris Payne MRN: [id number] DOB: 05-May-1950  Procedure: Left Heart Cath, Selective Coronary Angiography, LV angiography  Indication: Unstable angina, CAD s/p CABG  Procedural details: The right groin was prepped, draped, and anesthetized with 1% lidocaine.  I was able to cannulate the vessel but unable to advance a guidewire.  The left radial was prepped and draped  Anesthetized with 1% lidocaine.  Using modified Seldinger technique, a 5 French sheath was introduced into the left radial artery. Standard Judkins catheters were and used for coronary angiography and left ventriculography. Catheter exchanges were performed over a guidewire. A distal aortogram was obtained.  There were no immediate procedural complications. The patient was transferred to the post catheterization recovery area for further monitoring.  Procedural Findings:  Hemodynamics:     AO 172/101    LV  168/7   Coronary angiography:   Coronary dominance: Right  Left mainstem:   LM ostial 70%  Left anterior descending (LAD):   Small caliber vessel wrapping the apex with long mid subtotal stenosis after the insertion site of the LIMA.  Diagonal is large with long proximal 90% stenosis.  Patent SVG.  Left circumflex (LCx):  Large RI is patent without high grade disease.  Osital AV groove with 80% stenosis but the vessel is somewhat small distal to this with a small to moderate sized OM.      Right coronary artery (RCA):  Dominant and occluded proximal with a large RV branch feeding some collateral flow to the distal RCA.  There is also left to right collateral flow.  The SVG to the RCA is occluded.  Grafts:      LIMA to LAD:  Patent but high grade disease distal to the graft insertion      SVG to RI:  Occluded      SVG to PDA:  Occluded      SVG to Diag:  Widely patent  Distal Aorta:  Patent with non obstructive plaque in the right iliac.  (Unclear why I was  unable to advance the guidewire.)  Left ventriculography: Left ventricular systolic function with mild inferior hypokinesis, LVEF is estimated at 55%, there is no significant mitral regurgitation   Final Conclusions:  Severe native vessel disease with 2/4 grafts occluded and high grade stenosis in the LAD distal to the LIMA insertion.  This vessel is not amenable to PCI.    Recommendations: Medical management.  Rollene Rotunda 06/21/2012, 10:52 AM

## 2012-06-21 NOTE — Interval H&P Note (Signed)
History and Physical Interval Note:  06/21/2012 10:51 AM  Curtis Sites  has presented today for surgery, with the diagnosis of cp  The various methods of treatment have been discussed with the patient and family. After consideration of risks, benefits and other options for treatment, the patient has consented to  Procedure(s): LEFT HEART CATHETERIZATION WITH CORONARY/GRAFT ANGIOGRAM as a surgical intervention .  The patient's history has been reviewed, patient examined, no change in status, stable for surgery.  I have reviewed the patient's chart and labs.  Questions were answered to the patient's satisfaction.     Rollene Rotunda

## 2012-06-21 NOTE — Progress Notes (Signed)
TR BAND REMOVAL  LOCATION:    left radial  DEFLATED PER PROTOCOL:    yes  TIME BAND OFF / DRESSING APPLIED:    1400   SITE UPON ARRIVAL:    Level 0  SITE AFTER BAND REMOVAL:    Level 0  REVERSE ALLEN'S TEST:     positive  CIRCULATION SENSATION AND MOVEMENT:    Within Normal Limits   yes  COMMENTS:   Tolerated procedure well

## 2012-06-21 NOTE — Progress Notes (Signed)
ANTICOAGULATION CONSULT NOTE - Initial Consult  Pharmacy Consult for heparin Indication: VTE prophylaxis  No Known Allergies  Patient Measurements: Height: 5\' 7"  (170.2 cm) Weight: 175 lb 12.8 oz (79.742 kg) IBW/kg (Calculated) : 66.1 Heparin Dosing Weight:   Vital Signs: Temp: 97.8 F (36.6 C) (02/25 1210) Temp src: Oral (02/25 1210) BP: 138/86 mmHg (02/25 1210) Pulse Rate: 55 (02/25 1210)  Labs:  Recent Labs  06/19/12 1800 06/19/12 1856 06/19/12 2153 06/19/12 2202 06/20/12 0700 06/20/12 1019 06/20/12 1020 06/21/12 0557  HGB 16.6  --   --   --   --  15.6  --  15.0  HCT 48.5  --   --   --   --  44.7  --  44.5  PLT 252  --   --   --   --  215  --  206  APTT  --   --   --  35  --   --   --   --   LABPROT  --   --   --  14.2  --   --   --   --   INR  --   --   --  1.11  --   --   --   --   HEPARINUNFRC  --   --   --   --   --  0.32  --  0.58  CREATININE  --  0.76  --  0.68  --   --   --   --   TROPONINI  --   --  <0.30  --  <0.30  --  <0.30  --     Estimated Creatinine Clearance: 95.6 ml/min (by C-G formula based on Cr of 0.68).   Medical History: Past Medical History  Diagnosis Date  . Hypertension   . Stroke     h/o pontine CVA  . AAA (abdominal aortic aneurysm)     s/p repair 6/11  . Hyperlipidemia   . CAD (coronary artery disease)     s/p CABG 2/12:  LIMA to LAD, SVG to diagonal-1, SVG to ramus intermedius,, SVG to AM (Dr. Dorris Fetch) ;  b.  Myoview 10/13:  inf infarct with very mild peri-infarct ischemia, EF 49%, inf HK   . Carotid stenosis     a.  dopplers 8/12: LICA 50-69% => repeat 11/2011  . PAD (peripheral artery disease)     ABIs 4/12:  R 0.88, L 0.92; R SFA 40%, L CFA 50%, L SFA 50-60%  . COPD (chronic obstructive pulmonary disease)     Medications:  Scheduled:  . [COMPLETED] aspirin  324 mg Oral Pre-Cath  . [START ON 06/22/2012] aspirin EC  81 mg Oral Daily  . atorvastatin  40 mg Oral Daily  . [COMPLETED] diazepam  5 mg Oral On Call  .  fenofibrate  54 mg Oral Daily  . [COMPLETED] fentaNYL      . furosemide  20 mg Oral Daily  . gabapentin  600 mg Oral QHS  . [COMPLETED] heparin      . [COMPLETED] heparin      . [COMPLETED] heparin      . [COMPLETED] lidocaine (PF)      . [COMPLETED] lidocaine (PF)      . metoprolol succinate  25 mg Oral Daily  . [COMPLETED] midazolam      . [COMPLETED] midazolam      . pantoprazole  40 mg Oral Daily  . polyethylene glycol  17 g  Oral Daily  . sodium chloride  3 mL Intravenous Q12H  . [COMPLETED] verapamil      . [DISCONTINUED] aspirin EC  81 mg Oral Daily  . [DISCONTINUED]  ceFAZolin (ANCEF) IV  2 g Intravenous On Call to OR  . [DISCONTINUED] sodium chloride  3 mL Intravenous Q12H  . [DISCONTINUED] sodium chloride  3 mL Intravenous Q12H   Infusions:  . sodium chloride    . [DISCONTINUED] sodium chloride    . [DISCONTINUED] sodium chloride 1 mL/kg/hr (06/21/12 0529)  . [DISCONTINUED] heparin 1,150 Units/hr (06/20/12 1255)    Assessment: 62 yo male with chest pain s/p cath will be switched from treatment dose of heparin to prophylactic dose.    Goal of Therapy:      Plan:  1) D/c heparin drip.  Start heparin 5000 units SQ every 8 hours, 1st dose at 2200 tonight   Kiondre Grenz, Tsz-Yin 06/21/2012,1:26 PM

## 2012-06-22 ENCOUNTER — Encounter (HOSPITAL_COMMUNITY): Payer: Self-pay | Admitting: Cardiology

## 2012-06-22 LAB — CBC
HCT: 43.4 % (ref 39.0–52.0)
Hemoglobin: 14.3 g/dL (ref 13.0–17.0)
MCV: 98.2 fL (ref 78.0–100.0)
WBC: 7.3 10*3/uL (ref 4.0–10.5)

## 2012-06-22 MED ORDER — ISOSORBIDE MONONITRATE ER 30 MG PO TB24: 30.0000 mg | ORAL_TABLET | Freq: Every day | ORAL | Status: DC

## 2012-06-22 NOTE — Discharge Summary (Signed)
Discharge Summary   Patient ID: Chris Payne MRN: [id number], DOB/AGE: 1951-02-01 62 y.o.  Primary MD: Dow Adolph, MD Primary Cardiologist: Rollene Rotunda MD Admit date: 06/19/2012 D/C date:     06/22/2012      Primary Discharge Diagnoses:  1. Unstable Angina/CAD  - Cath 2/25 showed 2/4 occluded grafts & high grade stenosis in the LAD distal to the LIMA insertion, not amenable to PCI - Med Rx, Imdur Added  2. Hypertension 3. Hyperlipidemia  Secondary Discharge Diagnoses:  . Stroke     h/o pontine CVA  . AAA (abdominal aortic aneurysm)     s/p repair 6/11  . CAD (coronary artery disease)     s/p CABG 2/12:  LIMA to LAD, SVG to diagonal-1, SVG to ramus intermedius,, SVG to AM (Dr. Dorris Fetch) ;  b.  Myoview 10/13:  inf infarct with very mild peri-infarct ischemia, EF 49%, inf HK; c. Cath 2/25 showed 2/4 occluded grafts & high grade stenosis in the LAD distal to the LIMA insertion, not amenable to PCI - Med Rx  . Carotid stenosis     a.  dopplers 8/12: LICA 50-69% => repeat 11/2011  . PAD (peripheral artery disease)     ABIs 4/12:  R 0.88, L 0.92; R SFA 40%, L CFA 50%, L SFA 50-60%  . COPD (chronic obstructive pulmonary disease)     Allergies No Known Allergies  Diagnostic Studies/Procedures:   06/21/12 - Cardiac Cath  Hemodynamics:  AO 172/101  LV 168/7  Coronary angiography:  Coronary dominance: Right  Left mainstem: LM ostial 70%  Left anterior descending (LAD): Small caliber vessel wrapping the apex with long mid subtotal stenosis after the insertion site of the LIMA. Diagonal is large with long proximal 90% stenosis. Patent SVG.  Left circumflex (LCx): Large RI is patent without high grade disease. Osital AV groove with 80% stenosis but the vessel is somewhat small distal to this with a small to moderate sized OM.  Right coronary artery (RCA): Dominant and occluded proximal with a large RV branch feeding some collateral flow to the distal RCA. There is also  left to right collateral flow. The SVG to the RCA is occluded.  Grafts:  LIMA to LAD: Patent but high grade disease distal to the graft insertion  SVG to RI: Occluded  SVG to PDA: Occluded  SVG to Diag: Widely patent  Distal Aorta: Patent with non obstructive plaque in the right iliac. (Unclear why I was unable to advance the guidewire.)  Left ventriculography: Left ventricular systolic function with mild inferior hypokinesis, LVEF is estimated at 55%, there is no significant mitral regurgitation  Final Conclusions: Severe native vessel disease with 2/4 grafts occluded and high grade stenosis in the LAD distal to the LIMA insertion. This vessel is not amenable to PCI.  Recommendations: Medical management  History of Present Illness: 62 y.o. male w/ the above medical problems who presented to [facility]12 with complaints of chest pain. He reported ~4 day history of vague chest pain that worsened on day of presentation prompting him to seek medical attention. It was relieved with NTG given by EMS.  Hospital Course: In the ED, EKG revealed NSR with minor ST depression in V2 and V3. CXR was without acute cardiopulmonary abnormalities. Labs were significant for normal troponin, unremarkable CBC/BMET. He was placed in observation for further evaluation and treatment. Cardiac enzymes were cycled and remained negative. Given his recurrent pain was similar to his previous angina and he was  pending hernia surgery it was felt he would benefit from ischemic evaluation with cardiac cath. Cath on 2/25 showed 2/4 occluded grafts & high grade stenosis in the LAD distal to the LIMA insertion. This was not felt to be amenable to PCI. He tolerated the procedure well without complications. Recommendations were made for medical therapy. Imdur was added to his medical regimen and he was educated on diet and exercise. Cath site remained stable.   He was seen and evaluated by Dr. Antoine Poche who felt he was  stable for discharge home with plans for follow up as scheduled below.  Discharge Vitals: Blood pressure 142/87, pulse 52, temperature 98.4 F (36.9 C), temperature source Oral, resp. rate 16, height 5\' 7"  (1.702 m), weight 183 lb 13.8 oz (83.4 kg), SpO2 95.00%.  Labs: Component Value Date   WBC 7.3 06/22/2012   HGB 14.3 06/22/2012   HCT 43.4 06/22/2012   MCV 98.2 06/22/2012   PLT 180 06/22/2012    Lab 06/19/12 2202  NA 138  K 3.6  CL 102  CO2 25  BUN 8  CREATININE 0.68  CALCIUM 8.9  PROT 7.5  BILITOT 0.4  ALKPHOS 91  ALT 19  AST 20  GLUCOSE 108*    06/19/12 2153  06/20/12 0700  06/20/12 1020   TROPONINI  <0.30  <0.30  <0.30     06/19/12 2202   TSH  0.811      Discharge Medications     Medication List    STOP taking these medications       oxyCODONE-acetaminophen 10-325 MG per tablet  Commonly known as:  PERCOCET      TAKE these medications       aspirin EC 81 MG tablet  Take 81 mg by mouth daily.     atorvastatin 40 MG tablet  Commonly known as:  LIPITOR  Take 1 tablet (40 mg total) by mouth daily.     esomeprazole 40 MG capsule  Commonly known as:  NEXIUM  Take 40 mg by mouth daily as needed. For acid reflux     fenofibrate 54 MG tablet  Take 54 mg by mouth daily.     furosemide 20 MG tablet  Commonly known as:  LASIX  Take 1 tablet (20 mg total) by mouth daily.     gabapentin 300 MG capsule  Commonly known as:  NEURONTIN  Take 600 mg by mouth at bedtime.     HYDROcodone-acetaminophen 7.5-325 MG per tablet  Commonly known as:  NORCO  Take 1 tablet by mouth every 8 (eight) hours as needed for pain.     isosorbide mononitrate 30 MG 24 hr tablet  Commonly known as:  IMDUR  Take 1 tablet (30 mg total) by mouth daily.     metoprolol succinate 25 MG 24 hr tablet  Commonly known as:  TOPROL-XL  Take 1 tablet (25 mg total) by mouth daily.     nitroGLYCERIN 0.4 MG SL tablet  Commonly known as:  NITROSTAT  Place 1 tablet (0.4 mg total)  under the tongue every 5 (five) minutes as needed for chest pain.     nystatin ointment  Commonly known as:  MYCOSTATIN  Apply 1 application topically daily as needed. For leg area        Disposition   Discharge Orders   Future Appointments Provider Department Dept Phone   07/06/2012 8:50 AM Beatrice Lecher, PA [facility] [place]) [phone]   07/14/2012 1:00 PM [place] Dennie [facility] [address] [facility]  [facility] SAME DAY SURGERY [phone]   08/08/2012 11:00 AM Robyne Askew, MD [facility], [place] [phone]   Future Orders Complete By Expires     Diet - low sodium heart healthy  As directed     Discharge instructions  As directed     Comments:      * Please take all medications as prescribed and bring them with you to your office visit.  * KEEP WRIST/GROIN CATHETERIZATION SITES CLEAN AND DRY. Call the office for any signs of bleeding, pus, swelling, increased pain, or any other concerns. * NO HEAVY LIFTING (>10lbs) OR SEXUAL ACTIVITY X 7 DAYS. * NO DRIVING X 2-3 DAYS. * NO SOAKING BATHS, HOT TUBS, POOLS, ETC., X 7 DAYS.    Increase activity slowly  As directed       Follow-up Information   Follow up with Tereso Newcomer, PA On 07/06/2012. (8:50)    Contact information:   [facility] [address] [place] [address] [phone]        Outstanding Labs/Studies:  None  Duration of Discharge Encounter: Greater than 30 minutes including physician and PA time.  Signed, Bennett Vanscyoc PA-C 06/22/2012, 9:43 AM

## 2012-06-22 NOTE — Progress Notes (Signed)
Cardiology Progress Note Patient Name: Chris Payne Date of Encounter: 06/22/2012, 6:50 AM     Subjective  Rested well overnight. No chest pain or sob.   Objective   Telemetry: NSR 50-60s  Medications: . aspirin EC  81 mg Oral Daily  . atorvastatin  40 mg Oral Daily  . fenofibrate  54 mg Oral Daily  . furosemide  20 mg Oral Daily  . gabapentin  600 mg Oral QHS  . heparin subcutaneous  5,000 Units Subcutaneous Q8H  . metoprolol succinate  25 mg Oral Daily  . pantoprazole  40 mg Oral Daily  . polyethylene glycol  17 g Oral Daily  . sodium chloride  3 mL Intravenous Q12H      Physical Exam: Temp:  [97.8 F (36.6 C)-98.7 F (37.1 C)] 98.4 F (36.9 C) (02/26 0459) Pulse Rate:  [52-65] 52 (02/25 2300) Resp:  [16-18] 16 (02/25 1523) BP: (109-158)/(66-92) 142/87 mmHg (02/26 0648) SpO2:  [93 %-97 %] 93 % (02/25 2300) Weight:  [183 lb 13.8 oz (83.4 kg)] 183 lb 13.8 oz (83.4 kg) (02/26 0459)  General: Pleasant elderly white male, in no acute distress. Head: Normocephalic, atraumatic, sclera non-icteric, nares are without discharge.  Neck: Supple. Negative for carotid bruits or JVD Lungs: Clear bilaterally to auscultation without wheezes, rales, or rhonchi. Breathing is unlabored. Heart: RRR S1 S2 without murmurs, rubs, or gallops.  Abdomen: Soft, non-tender, non-distended with normoactive bowel sounds. No rebound/guarding. No obvious abdominal masses. Msk:  Strength and tone appear normal for age. Extremities: Left wrist and right groin without bleeding, hematoma, or bruit. No edema. No clubbing or cyanosis. Distal pedal pulses are intact and equal bilaterally. Neuro: Alert and oriented X 3. Moves all extremities spontaneously. Psych:  Responds to questions appropriately with a normal affect.   Intake/Output Summary (Last 24 hours) at 06/22/12 0650 Last data filed at 06/22/12 0300  Gross per 24 hour  Intake 2084.44 ml  Output    200 ml  Net 1884.44 ml     Labs:   06/19/12 1856 06/19/12 2202  NA 137 138  K 3.9 3.6  CL 99 102  CO2 29 25  GLUCOSE 119* 108*  BUN 9 8  CREATININE 0.76 0.68  CALCIUM 9.2 8.9    06/19/12 2202  AST 20  ALT 19  ALKPHOS 91  BILITOT 0.4  PROT 7.5  ALBUMIN 3.4*    06/19/12 1800 06/20/12 1019 06/21/12 0557  WBC 6.0 6.2 8.1  NEUTROABS 2.8  --   --   HGB 16.6 15.6 15.0  HCT 48.5 44.7 44.5  MCV 96.6 96.5 97.6  PLT 252 215 206    06/19/12 2153 06/20/12 0700 06/20/12 1020  TROPONINI <0.30 <0.30 <0.30    06/19/12 2202  TSH 0.811   Radiology/Studies:   06/19/2012  - PORTABLE CHEST - 1 VIEW  Findings: The cardiac silhouette remains normal in size.  Stable post CABG changes.  Interval mild bibasilar atelectasis and decreased depth of inspiration.  Unremarkable bones.  IMPRESSION: Poor inspiration with mild bibasilar atelectasis.     06/21/12 - Cardiac Cath Hemodynamics:  AO 172/101  LV 168/7  Coronary angiography:  Coronary dominance: Right  Left mainstem: LM ostial 70%  Left anterior descending (LAD): Small caliber vessel wrapping the apex with long mid subtotal stenosis after the insertion site of the LIMA. Diagonal is large with long proximal 90% stenosis. Patent SVG.  Left circumflex (LCx): Large RI is patent without high grade disease.  Osital AV groove with 80% stenosis but the vessel is somewhat small distal to this with a small to moderate sized OM.  Right coronary artery (RCA): Dominant and occluded proximal with a large RV branch feeding some collateral flow to the distal RCA. There is also left to right collateral flow. The SVG to the RCA is occluded.  Grafts:  LIMA to LAD: Patent but high grade disease distal to the graft insertion  SVG to RI: Occluded  SVG to PDA: Occluded  SVG to Diag: Widely patent  Distal Aorta: Patent with non obstructive plaque in the right iliac. (Unclear why I was unable to advance the guidewire.)  Left ventriculography: Left ventricular systolic  function with mild inferior hypokinesis, LVEF is estimated at 55%, there is no significant mitral regurgitation  Final Conclusions: Severe native vessel disease with 2/4 grafts occluded and high grade stenosis in the LAD distal to the LIMA insertion. This vessel is not amenable to PCI.  Recommendations: Medical management     Assessment and Plan   1. Unstable Angina: Cath 2/25 showed 2/4 occluded grafts & high grade stenosis in the LAD distal to the LIMA insertion, not amenable to PCI. Recommendations are for medical management. Cont ASA, BB, statin. Consider adding Imdur. Cath site stable. Discussed diet and exercise.   2. Coronary Artery Disease: s/p 4v CABG 2012. Cath and plans as above  3. Hypertension: BP stable  4. Hyperlipidemia: Cont statin  Disposition: Likely DC this morning after MD evaluation   Signed, HOPE, JESSICA PA-C  History and all data above reviewed.  Patient examined.  I agree with the findings as above.   No further chest pain.  No SOB.  The patient exam reveals COR:RRR  ,  Lungs: Decreased breath sounds  ,  Abd: Positive bowel sounds, no rebound no guarding, Ext No edema, right femoral no hematoma, left radial access site OK  .  All available labs, radiology testing, previous records reviewed. Agree with documented assessment and plan. OK to discharge.  Add Imdur 30 mg at discharge.     Chris Payne Chris Payne  9:04 AM  06/22/2012

## 2012-07-04 ENCOUNTER — Other Ambulatory Visit: Payer: Self-pay | Admitting: Family Medicine

## 2012-07-06 ENCOUNTER — Ambulatory Visit (INDEPENDENT_AMBULATORY_CARE_PROVIDER_SITE_OTHER): Payer: Medicare Other | Admitting: Physician Assistant

## 2012-07-06 ENCOUNTER — Encounter: Payer: Self-pay | Admitting: Physician Assistant

## 2012-07-06 VITALS — BP 118/80 | HR 62 | Wt 173.0 lb

## 2012-07-06 DIAGNOSIS — I739 Peripheral vascular disease, unspecified: Secondary | ICD-10-CM

## 2012-07-06 DIAGNOSIS — I6529 Occlusion and stenosis of unspecified carotid artery: Secondary | ICD-10-CM

## 2012-07-06 DIAGNOSIS — M79609 Pain in unspecified limb: Secondary | ICD-10-CM

## 2012-07-06 DIAGNOSIS — Z01810 Encounter for preprocedural cardiovascular examination: Secondary | ICD-10-CM

## 2012-07-06 DIAGNOSIS — I2510 Atherosclerotic heart disease of native coronary artery without angina pectoris: Secondary | ICD-10-CM

## 2012-07-06 NOTE — Progress Notes (Addendum)
[address] [place], [place]  [address] Phone: [phone], Fax:  [phone]  Date:  07/06/2012   ID:  Chris Payne, DOB Nov 06, 1950, MRN [id number]  PCP:  Dow Adolph, MD  Primary Cardiologist:  Dr. Rollene Rotunda   History of Present Illness: Chris Payne is a 62 y.o. male who returns for followup after recent admission to the hospital with unstable angina.  He has a hx of CAD, status post CABG in 05/2010, abdominal aortic aneurysm, status post AAA repair 6/11, prior stroke, carotid stenosis, PAD, HTN, HL, COPD.  Echo 05/2010: EF 45-50%, basal-mid inferolateral and inferior HK, grade 1 diastolic dysfunction.  He was admitted 2/23-2/26. He presented with 4 days of chest discomfort. Cardiac enzymes remained normal.  LHC 06/21/12: LIMA-LAD patent with high-grade disease distal to graft insertion, SVG-RI occluded, SVG-PDA occluded, SVG-diagonal patent, EF 55%. Patient was noted to have severe native vessel disease with 2 of 4 grafts occluded and high-grade stenosis in the distal LAD beyond the LIMA insertion. This was not amenable to PCI and medical management was recommended.  He was placed on isosorbide. Since discharge, he denies any further chest pain. He walks with a walker due to leg length discrepancy.  Denies significant dyspnea. Denies syncope. Denies orthopnea, PND.  He needs surgical clearance for left inguinal hernia repair with Dr. Carolynne Edouard in 2 weeks.   Labs (2/14):  K 3.6, creatinine 0.68, ALT 19, Hgb 14.3, TSH 0.811  Wt Readings from Last 3 Encounters:  07/06/12 173 lb (78.472 kg)  06/22/12 183 lb 13.8 oz (83.4 kg)  06/22/12 183 lb 13.8 oz (83.4 kg)     Past Medical History  Diagnosis Date  . Hypertension   . Stroke     h/o pontine CVA  . AAA (abdominal aortic aneurysm)     s/p repair 6/11  . Hyperlipidemia   . CAD (coronary artery disease)     s/p CABG 2/12:  LIMA to LAD, SVG to diagonal-1, SVG to ramus intermedius,, SVG to AM (Dr.  Dorris Fetch) ;  b.  Myoview 10/13:  inf infarct with very mild peri-infarct ischemia, EF 49%, inf HK; c. Cath 2/25 showed 2/4 occluded grafts & high grade stenosis in the LAD distal to the LIMA insertion, not amenable to PCI - Med Rx  . Carotid stenosis     a.  dopplers 8/12: LICA 50-69% => repeat 11/2011  . PAD (peripheral artery disease)     ABIs 4/12:  R 0.88, L 0.92; R SFA 40%, L CFA 50%, L SFA 50-60%  . COPD (chronic obstructive pulmonary disease)   . Inguinal hernia     Current Outpatient Prescriptions  Medication Sig Dispense Refill  . aspirin EC 81 MG tablet Take 81 mg by mouth daily.      Marland Kitchen atorvastatin (LIPITOR) 40 MG tablet Take 1 tablet (40 mg total) by mouth daily.  30 tablet  11  . esomeprazole (NEXIUM) 40 MG capsule Take 40 mg by mouth daily as needed. For acid reflux      . fenofibrate 54 MG tablet Take 54 mg by mouth daily.      . furosemide (LASIX) 20 MG tablet Take 1 tablet (20 mg total) by mouth daily.  30 tablet  11  . gabapentin (NEURONTIN) 300 MG capsule Take 600 mg by mouth at bedtime.      Marland Kitchen Hydrocodone-Acetaminophen (VICODIN PO) Take by mouth as needed.      . isosorbide mononitrate (IMDUR) 30 MG 24 hr  tablet Take 1 tablet (30 mg total) by mouth daily.  30 tablet  6  . metoprolol succinate (TOPROL-XL) 25 MG 24 hr tablet Take 1 tablet (25 mg total) by mouth daily.  30 tablet  12  . nitroGLYCERIN (NITROSTAT) 0.4 MG SL tablet Place 1 tablet (0.4 mg total) under the tongue every 5 (five) minutes as needed for chest pain.  25 tablet  3  . nystatin ointment (MYCOSTATIN) Apply 1 application topically daily as needed. For leg area       No current facility-administered medications for this visit.    Allergies:   No Known Allergies  Social History:  The patient  reports that he quit smoking about 2 years ago. His smoking use included Cigarettes. He has a 30 pack-year smoking history. He has never used smokeless tobacco. He reports that he does not drink alcohol or use  illicit drugs.   ROS:  Please see the history of present illness.   He has significant bilateral leg pain worse at night that keeps him awake.   All other systems reviewed and negative.   PHYSICAL EXAM: VS:  BP 118/80  Pulse 62  Wt 173 lb (78.472 kg)  BMI 27.09 kg/m2 Well nourished, well developed, in no acute distress HEENT: normal Neck: no JVD Cardiac:  normal S1, S2; RRR; no murmur Lungs:  clear to auscultation bilaterally, no wheezing, rhonchi or rales Abd: soft, nontender, no hepatomegaly Ext: no edema; right groin without hematoma or bruit, right wrist without hematoma or bruit  Skin: warm and dry Neuro:  CNs 2-12 intact, no focal abnormalities noted  EKG:  NSR, HR 62, leftward axis, nonspecific ST-T wave changes, no change from prior tracing     ASSESSMENT AND PLAN:  1. Coronary Artery Disease:  Stable. No further angina. As noted, his cardiac catheterization demonstrated 2/4 grafts occluded with severe distal LAD disease beyond LIMA insertion not amenable to PCI. He seems to be doing well with medical therapy. Continue aspirin, statin. 2. Surgical Clearance: As he has undergone recent cardiac catheterization, he does not require further cardiovascular testing. His risk is likely moderate at best for any procedure. I will review this further with Dr. Antoine Poche before making final recommendations regarding surgical clearance. 3. Carotid Stenosis:  He is due for followup carotid Dopplers. 4. Leg Pain: ABIs were last performed in 2012. He does have a history of PAD. I will obtain ABIs in followup. If these are largely unchanged, he should followup with his PCP for other causes of his leg pain. 5. Hypertension: Controlled. 6. Hyperlipidemia: Continue statin. 7. Disposition: Followup with Dr. Antoine Poche 3 months.  Luna Glasgow, PA-C  9:16 AM 07/06/2012     ADDENDUM 07/08/2012 1:16 PM  Reviewed case with Dr. Rollene Rotunda.  Patient is moderate risk for cardiovascular  complications for any procedure.  Patient and his surgeon will need to weigh benefits of his surgery against the risk and decide if his surgery is necessary.  Signed,  Tereso Newcomer, PA-C  1:17 PM 07/08/2012

## 2012-07-06 NOTE — Patient Instructions (Addendum)
Your physician has requested that you have a lower extremity arterial duplex THIS IS TO BE DONE WITH ABI'S DX PAD, CLAUDICATION. This test is an ultrasound of the arteries in the legs or arms. It looks at arterial blood flow in the legs and arms. Allow one hour for Lower and Upper Arterial scans. There are no restrictions or special instructions  Your physician has requested that you have a carotid duplex DX V72.81, 414.01. This test is an ultrasound of the carotid arteries in your neck. It looks at blood flow through these arteries that supply the brain with blood. Allow one hour for this exam. There are no restrictions or special instructions.  PLEASE FOLLOW UP WITH DR. HOCHREIN IN 3 MONTHS  NO CHANGES WERE MADE TODAY

## 2012-07-08 ENCOUNTER — Encounter (HOSPITAL_COMMUNITY): Payer: Self-pay | Admitting: Pharmacy Technician

## 2012-07-11 ENCOUNTER — Encounter (INDEPENDENT_AMBULATORY_CARE_PROVIDER_SITE_OTHER): Payer: Medicare Other

## 2012-07-11 DIAGNOSIS — I6529 Occlusion and stenosis of unspecified carotid artery: Secondary | ICD-10-CM

## 2012-07-14 ENCOUNTER — Encounter (HOSPITAL_COMMUNITY): Payer: Self-pay

## 2012-07-14 ENCOUNTER — Encounter (HOSPITAL_COMMUNITY)
Admission: RE | Admit: 2012-07-14 | Discharge: 2012-07-14 | Disposition: A | Discharge status: Home / Self Care | Payer: Medicare Other | Source: Ambulatory Visit | Attending: General Surgery | Admitting: General Surgery

## 2012-07-14 ENCOUNTER — Telehealth: Payer: Self-pay | Admitting: *Deleted

## 2012-07-14 DIAGNOSIS — E041 Nontoxic single thyroid nodule: Secondary | ICD-10-CM

## 2012-07-14 HISTORY — DX: Nontoxic single thyroid nodule: E04.1

## 2012-07-14 NOTE — Pre-Procedure Instructions (Signed)
Chris Payne  07/14/2012   Your procedure is scheduled on: Thursday, March 27,2014  Report to [facility] at 6;00 AM.  Call this number if you have problems the morning of surgery: [phone]   Remember:   Do not eat food or drink liquids after midnight.   Take these medicines the morning of surgery with A SIP OF WATER:esomeprazole, gabapentin (Neurontin), isosorbide (Imdur), and if needed nitroglycerine (Nitrostat)    Do not wear jewelry, make-up or nail polish.  Do not wear lotions, powders, or perfumes. You may wear deodorant.   Men may shave face and neck.  Do not bring valuables to the hospital.  Contacts, dentures or bridgework may not be worn into surgery.  Leave suitcase in the car. After surgery it may be brought to your room.  For patients admitted to the hospital, checkout time is 11:00 AM the day of discharge.   Patients discharged the day of surgery will not be allowed to drive home.  Name and phone number of your driver friend/family  Special Instructions: Shower using CHG 2 nights before surgery and the night before surgery.  If you shower the day of surgery use CHG.  Use special wash - you have one bottle of CHG for all showers.  You should use approximately 1/3 of the bottle for each shower.   Please read over the following fact sheets that you were given: Pain Booklet, Coughing and Deep Breathing and Surgical Site Infection Prevention

## 2012-07-14 NOTE — Telephone Encounter (Signed)
Message copied by Antony Odea on Thu Jul 14, 2012  2:43 PM ------      Message from: [place], [place] T      Created: Thu Jul 14, 2012  1:38 PM       Slightly worsened LICA stenosis (60-79%; previously 50-69%).      Repeat carotid dopplers in 6 mos.      Incidental small thyroid nodules noted.       Arrange thyroid ultrasound.      Tereso Newcomer, PA-C  1:38 PM 07/14/2012 ------

## 2012-07-14 NOTE — Progress Notes (Signed)
Patient did not come for PAT.  I called patient and he thought appointment was next week. Scheduler will call patient to re schedule.

## 2012-07-14 NOTE — Telephone Encounter (Signed)
Pt called with results, agreed to thyroid u/s for nodule seen on carotid u/s. Order sent to pcc to arrange,

## 2012-07-15 ENCOUNTER — Other Ambulatory Visit: Payer: Self-pay | Admitting: *Deleted

## 2012-07-15 ENCOUNTER — Encounter (HOSPITAL_COMMUNITY): Payer: Self-pay

## 2012-07-15 ENCOUNTER — Encounter (HOSPITAL_COMMUNITY)
Admission: RE | Admit: 2012-07-15 | Discharge: 2012-07-15 | Disposition: A | Discharge status: Home / Self Care | Payer: Medicare Other | Source: Ambulatory Visit | Attending: General Surgery | Admitting: General Surgery

## 2012-07-15 HISTORY — DX: Gastro-esophageal reflux disease without esophagitis: K21.9

## 2012-07-15 HISTORY — DX: Acute myocardial infarction, unspecified: I21.9

## 2012-07-15 HISTORY — DX: Pneumonia, unspecified organism: J18.9

## 2012-07-15 LAB — CBC
HCT: 47.2 % (ref 39.0–52.0)
Hemoglobin: 16.2 g/dL (ref 13.0–17.0)
MCHC: 34.3 g/dL (ref 30.0–36.0)
RBC: 4.81 MIL/uL (ref 4.22–5.81)
WBC: 8.2 10*3/uL (ref 4.0–10.5)

## 2012-07-15 LAB — BASIC METABOLIC PANEL
BUN: 15 mg/dL (ref 6–23)
Chloride: 104 mEq/L (ref 96–112)
GFR calc Af Amer: >90 mL/min (ref >90)
GFR calc non Af Amer: >90 mL/min (ref >90)
Potassium: 4.5 mEq/L (ref 3.5–5.1)
Sodium: 140 mEq/L (ref 135–145)

## 2012-07-15 LAB — SURGICAL PCR SCREEN: MRSA, PCR: NEGATIVE

## 2012-07-15 NOTE — Progress Notes (Signed)
Cardiac note by scott weaver [place] 3/14/clearance ekg 3.14 cxr 11/13,echo 6/11 and?2/12 per nite stress 10/13 cath 2/14

## 2012-07-15 NOTE — Pre-Procedure Instructions (Addendum)
Alyus L Vines  07/15/2012   Your procedure is scheduled on: Thursday, March 27,2014  Report to [facility] at 600 AM.  Call this number if you have problems the morning of surgery: [phone]   Remember:   Do not eat food or drink liquids after midnight.                          (nexium)  Take these medicines the morning of surgery with A SIP OF WATER:esomeprazole, gabapentin (Neurontin), isosorbide (Imdur), and if needed nitroglycerine (Nitrostat)     Do not wear jewelry, make-up or nail polish.  Do not wear lotions, powders, or perfumes. You may wear deodorant.   Men may shave face and neck.  Do not bring valuables to the hospital.  Contacts, dentures or bridgework may not be worn into surgery.  Leave suitcase in the car. After surgery it may be brought to your room.  For patients admitted to the hospital, checkout time is 11:00 AM the day of discharge.   Patients discharged the day of surgery will not be allowed to drive home.  Name and phone number of your driver friend/family  Special Instructions: Shower using CHG 2 nights before surgery and the night before surgery.  If you shower the day of surgery use CHG.  Use special wash - you have one bottle of CHG for all showers.  You should use approximately 1/3 of the bottle for each shower.   Please read over the following fact sheets that you were given: Pain Booklet, Coughing and Deep Breathing and Surgical Site Infection Prevention   

## 2012-07-15 NOTE — Pre-Procedure Instructions (Signed)
[name]  07/15/2012   Your procedure is scheduled on: Thursday, March 27,2014  Report to [facility] at 600 AM.  Call this number if you have problems the morning of surgery: [phone]   Remember:   Do not eat food or drink liquids after midnight.                          (nexium)  Take these medicines the morning of surgery with A SIP OF WATER:esomeprazole, gabapentin (Neurontin), isosorbide (Imdur), and if needed nitroglycerine (Nitrostat)     Do not wear jewelry, make-up or nail polish.  Do not wear lotions, powders, or perfumes. You may wear deodorant.   Men may shave face and neck.  Do not bring valuables to the hospital.  Contacts, dentures or bridgework may not be worn into surgery.  Leave suitcase in the car. After surgery it may be brought to your room.  For patients admitted to the hospital, checkout time is 11:00 AM the day of discharge.   Patients discharged the day of surgery will not be allowed to drive home.  Name and phone number of your driver friend/family  Special Instructions: Shower using CHG 2 nights before surgery and the night before surgery.  If you shower the day of surgery use CHG.  Use special wash - you have one bottle of CHG for all showers.  You should use approximately 1/3 of the bottle for each shower.   Please read over the following fact sheets that you were given: Pain Booklet, Coughing and Deep Breathing and Surgical Site Infection Prevention

## 2012-07-18 ENCOUNTER — Ambulatory Visit (HOSPITAL_COMMUNITY)
Admission: RE | Admit: 2012-07-18 | Discharge: 2012-07-18 | Disposition: A | Discharge status: Home / Self Care | Payer: Medicare Other | Source: Ambulatory Visit | Attending: Physician Assistant | Admitting: Physician Assistant

## 2012-07-18 DIAGNOSIS — E041 Nontoxic single thyroid nodule: Secondary | ICD-10-CM | POA: Insufficient documentation

## 2012-07-18 DIAGNOSIS — K219 Gastro-esophageal reflux disease without esophagitis: Secondary | ICD-10-CM | POA: Insufficient documentation

## 2012-07-18 DIAGNOSIS — I2510 Atherosclerotic heart disease of native coronary artery without angina pectoris: Secondary | ICD-10-CM | POA: Insufficient documentation

## 2012-07-18 DIAGNOSIS — J449 Chronic obstructive pulmonary disease, unspecified: Secondary | ICD-10-CM | POA: Insufficient documentation

## 2012-07-18 DIAGNOSIS — I6529 Occlusion and stenosis of unspecified carotid artery: Secondary | ICD-10-CM | POA: Insufficient documentation

## 2012-07-18 DIAGNOSIS — Z87891 Personal history of nicotine dependence: Secondary | ICD-10-CM | POA: Insufficient documentation

## 2012-07-18 DIAGNOSIS — J4489 Other specified chronic obstructive pulmonary disease: Secondary | ICD-10-CM | POA: Insufficient documentation

## 2012-07-18 DIAGNOSIS — Z01812 Encounter for preprocedural laboratory examination: Secondary | ICD-10-CM | POA: Insufficient documentation

## 2012-07-18 DIAGNOSIS — I10 Essential (primary) hypertension: Secondary | ICD-10-CM | POA: Insufficient documentation

## 2012-07-18 DIAGNOSIS — Z01818 Encounter for other preprocedural examination: Secondary | ICD-10-CM | POA: Insufficient documentation

## 2012-07-18 DIAGNOSIS — E785 Hyperlipidemia, unspecified: Secondary | ICD-10-CM | POA: Insufficient documentation

## 2012-07-18 DIAGNOSIS — Z01810 Encounter for preprocedural cardiovascular examination: Secondary | ICD-10-CM | POA: Insufficient documentation

## 2012-07-18 NOTE — Progress Notes (Signed)
Anesthesia Chart Review:  Patient is a 62 year old male scheduled for left inguinal hernia repair with mesh by Dr. Carolynne Edouard on 07/21/12.  History includes former smoker, CAD s/p CABG (LIMA to LAD, SVG to D1, SVG to Ramus, SVG to AM) 05/2010, HTN, HLD, PAD with carotid artery stenosis (60-79% LICA stenosis and 0-39% RICA stenosis 07/11/12), COPD, CVA, GERD, AAA open repair 2011, recent thyroid nodules seen on carotid duplex with plans for further evaluation by her PCP.  PCP is Dr. Dow Adolph.  Cardiologist is Dr. Rollene Rotunda.  He was seen by Tereso Newcomer, PA-C on 07/08/12 for preoperative cardiac clearance.  Patient chest pain had resolved following addition of Imdur.  No further testing was recommended, but he was felt moderate risk at best for any procedure.  Scott also reviewed this with Dr. Antoine Poche who said, "Patient is moderate risk for cardiovascular complications for any procedure. Patient and his surgeon will need to weigh benefits of his surgery against the risk and decide if his surgery is necessary."  Coronary angiography on 06/21/12:  Coronary dominance: Right  Left mainstem: LM ostial 70%  Left anterior descending (LAD): Small caliber vessel wrapping the apex with long mid subtotal stenosis after the insertion site of the LIMA. Diagonal is large with long proximal 90% stenosis. Patent SVG.  Left circumflex (LCx): Large RI is patent without high grade disease. Osital AV groove with 80% stenosis but the vessel is somewhat small distal to this with a small to moderate sized OM.  Right coronary artery (RCA): Dominant and occluded proximal with a large RV branch feeding some collateral flow to the distal RCA. There is also left to right collateral flow. The SVG to the RCA is occluded.  Grafts:  LIMA to LAD: Patent but high grade disease distal to the graft insertion  SVG to RI: Occluded  SVG to PDA: Occluded  SVG to Diag: Widely patent  Distal Aorta: Patent with non obstructive plaque in the  right iliac. (Unclear why I was unable to advance the guidewire.)  Left ventriculography: Left ventricular systolic function with mild inferior hypokinesis, LVEF is estimated at 55%, there is no significant mitral regurgitation  Final Conclusions: Severe native vessel disease with 2/4 grafts occluded and high grade stenosis in the LAD distal to the LIMA insertion. This vessel is not amenable to PCI.  Recommendations: Medical management. (Imdur added.)  Nuclear Stress Test on 02/02/12 showed: Abnormal stress nuclear study with a moderate size, moderate intensity, partially reversible inferior defect consistent with prior inferior infarct and very mild peri-infarct ischemia. LV Ejection Fraction: 49%. LV Wall Motion: Inferior hypokinesis.  Echo on 06/22/10 showed: - Left ventricle: The cavity size was normal. Systolic function was mildly reduced. The estimated ejection fraction was in the range of 45% to 50%. Moderate hypokinesis of the basal-mid inferolateral and inferior myocardium.  Doppler parameters are consistent with abnormal left ventricular relaxation (grade 1 diastolic dysfunction). - Mitral valve: Trivial regurgitation.  EKG on 07/06/12 showed NSR, leftward axis, non-specific ST/T wave changes.  CXR report on 03/04/12 showed: Pleural scarring at the right base. Linear parenchymal scar at the left base. Lungs are otherwise grossly clear. Postoperative changes from sternotomy and coronary artery bypass grafting. No lung mass or consolidation. No pneumothorax or pleural effusion.  Preoperative labs noted.  I was not asked to evaluate Chris Payne during his PAT visit, but he has had recent cardiology evaluation with recommendations as outlined above.  Dr. Carolynne Edouard has since scheduled IHR.  Patient will  be evaluated by his assigned anesthesiologist on the day of surgery.  If no acute CV symptoms then would anticipate he could proceed.  Definitve anesthesia plan to be determined on the day of surgery.     Velna Ochs [facility]Anesthesiology Phone [phone] 07/18/2012 2:25 PM

## 2012-07-19 ENCOUNTER — Encounter: Payer: Self-pay | Admitting: Cardiology

## 2012-07-19 ENCOUNTER — Telehealth: Payer: Self-pay | Admitting: *Deleted

## 2012-07-19 ENCOUNTER — Encounter: Payer: Self-pay | Admitting: *Deleted

## 2012-07-19 DIAGNOSIS — E042 Nontoxic multinodular goiter: Secondary | ICD-10-CM

## 2012-07-19 NOTE — Telephone Encounter (Signed)
Message copied by Tarri Fuller on Tue Jul 19, 2012  9:31 AM ------      Message from: [place], [place] T      Created: Mon Jul 18, 2012  3:59 PM       Several sub-centimeter solid and cystic nodules in bilateral lobes of the thyroid.      Recent TSH normal.      Refer to Endocrinology for further evaluation and management (Dx: thyroid nodules).      Tereso Newcomer, PA-C  3:59 PM 07/18/2012 ------

## 2012-07-19 NOTE — Telephone Encounter (Signed)
voice mail box not set up yet. son's ph [phone] not working. I will send a letter today for ptcb and go over results and recommnedation. I will place referral in for endocrinologist dx thyroid nodules.

## 2012-07-21 ENCOUNTER — Ambulatory Visit (HOSPITAL_COMMUNITY): Payer: Medicare Other | Admitting: Certified Registered"

## 2012-07-21 ENCOUNTER — Encounter (HOSPITAL_COMMUNITY): Payer: Self-pay | Admitting: Vascular Surgery

## 2012-07-21 ENCOUNTER — Encounter (HOSPITAL_COMMUNITY)
Admission: RE | Disposition: A | Discharge status: Home / Self Care | Payer: Self-pay | Source: Ambulatory Visit | Attending: General Surgery

## 2012-07-21 ENCOUNTER — Encounter (HOSPITAL_COMMUNITY): Payer: Self-pay | Admitting: *Deleted

## 2012-07-21 ENCOUNTER — Ambulatory Visit (HOSPITAL_COMMUNITY)
Admission: RE | Admit: 2012-07-21 | Discharge: 2012-07-21 | Disposition: A | Discharge status: Home / Self Care | Payer: Medicare Other | Source: Ambulatory Visit | Attending: General Surgery | Admitting: General Surgery

## 2012-07-21 DIAGNOSIS — K4090 Unilateral inguinal hernia, without obstruction or gangrene, not specified as recurrent: Secondary | ICD-10-CM

## 2012-07-21 DIAGNOSIS — Z8673 Personal history of transient ischemic attack (TIA), and cerebral infarction without residual deficits: Secondary | ICD-10-CM | POA: Insufficient documentation

## 2012-07-21 DIAGNOSIS — K219 Gastro-esophageal reflux disease without esophagitis: Secondary | ICD-10-CM | POA: Insufficient documentation

## 2012-07-21 DIAGNOSIS — I739 Peripheral vascular disease, unspecified: Secondary | ICD-10-CM | POA: Insufficient documentation

## 2012-07-21 DIAGNOSIS — J449 Chronic obstructive pulmonary disease, unspecified: Secondary | ICD-10-CM | POA: Insufficient documentation

## 2012-07-21 DIAGNOSIS — I209 Angina pectoris, unspecified: Secondary | ICD-10-CM | POA: Insufficient documentation

## 2012-07-21 DIAGNOSIS — I2510 Atherosclerotic heart disease of native coronary artery without angina pectoris: Secondary | ICD-10-CM | POA: Insufficient documentation

## 2012-07-21 DIAGNOSIS — Z8701 Personal history of pneumonia (recurrent): Secondary | ICD-10-CM | POA: Insufficient documentation

## 2012-07-21 DIAGNOSIS — I252 Old myocardial infarction: Secondary | ICD-10-CM | POA: Insufficient documentation

## 2012-07-21 DIAGNOSIS — J4489 Other specified chronic obstructive pulmonary disease: Secondary | ICD-10-CM | POA: Insufficient documentation

## 2012-07-21 DIAGNOSIS — I10 Essential (primary) hypertension: Secondary | ICD-10-CM | POA: Insufficient documentation

## 2012-07-21 HISTORY — PX: INGUINAL HERNIA REPAIR: SHX194

## 2012-07-21 HISTORY — PX: INSERTION OF MESH: SHX5868

## 2012-07-21 SURGERY — REPAIR, HERNIA, INGUINAL, ADULT
Anesthesia: General | Site: Groin | Laterality: Left | Wound class: Clean

## 2012-07-21 MED ORDER — CHLORHEXIDINE GLUCONATE 4 % EX LIQD: 1.0000 "application " | Freq: Once | CUTANEOUS | Status: DC

## 2012-07-21 MED ORDER — HYDROCODONE-ACETAMINOPHEN 5-325 MG PO TABS: 1.0000 | ORAL_TABLET | ORAL | Status: DC | PRN

## 2012-07-21 MED ORDER — BUPIVACAINE-EPINEPHRINE 0.25% -1:200000 IJ SOLN
INTRAMUSCULAR | Status: DC | PRN
  Administered 2012-07-21: 20 mL

## 2012-07-21 MED ORDER — LACTATED RINGERS IV SOLN
INTRAVENOUS | Status: DC | PRN
  Administered 2012-07-21 (×2): via INTRAVENOUS

## 2012-07-21 MED ORDER — LIDOCAINE HCL (CARDIAC) 20 MG/ML IV SOLN
INTRAVENOUS | Status: DC | PRN
  Administered 2012-07-21: 40 mg via INTRAVENOUS

## 2012-07-21 MED ORDER — ACETAMINOPHEN 10 MG/ML IV SOLN: 1000.0000 mg | Freq: Once | INTRAVENOUS | Status: DC | PRN

## 2012-07-21 MED ORDER — FENTANYL CITRATE 0.05 MG/ML IJ SOLN
INTRAMUSCULAR | Status: DC | PRN
  Administered 2012-07-21: 25 ug via INTRAVENOUS
  Administered 2012-07-21: 50 ug via INTRAVENOUS
  Administered 2012-07-21: 25 ug via INTRAVENOUS
  Administered 2012-07-21 (×2): 50 ug via INTRAVENOUS

## 2012-07-21 MED ORDER — PROPOFOL 10 MG/ML IV BOLUS
INTRAVENOUS | Status: DC | PRN
  Administered 2012-07-21: 120 mg via INTRAVENOUS

## 2012-07-21 MED ORDER — CEFAZOLIN SODIUM-DEXTROSE 2-3 GM-% IV SOLR
INTRAVENOUS | Status: AC
  Administered 2012-07-21: 2 g via INTRAVENOUS
  Filled 2012-07-21: qty 50

## 2012-07-21 MED ORDER — MIDAZOLAM HCL 5 MG/5ML IJ SOLN
INTRAMUSCULAR | Status: DC | PRN
  Administered 2012-07-21: 1 mg via INTRAVENOUS

## 2012-07-21 MED ORDER — HYDROMORPHONE HCL PF 1 MG/ML IJ SOLN: 0.2500 mg | INTRAMUSCULAR | Status: DC | PRN

## 2012-07-21 MED ORDER — PHENYLEPHRINE HCL 10 MG/ML IJ SOLN
INTRAMUSCULAR | Status: DC | PRN
  Administered 2012-07-21: 40 ug via INTRAVENOUS
  Administered 2012-07-21 (×3): 80 ug via INTRAVENOUS

## 2012-07-21 MED ORDER — ONDANSETRON HCL 4 MG/2ML IJ SOLN: 4.0000 mg | Freq: Once | INTRAMUSCULAR | Status: DC | PRN

## 2012-07-21 MED ORDER — BUPIVACAINE-EPINEPHRINE 0.25% -1:200000 IJ SOLN
INTRAMUSCULAR | Status: AC
  Filled 2012-07-21: qty 1

## 2012-07-21 MED ORDER — ONDANSETRON HCL 4 MG/2ML IJ SOLN
INTRAMUSCULAR | Status: DC | PRN
  Administered 2012-07-21: 4 mg via INTRAVENOUS

## 2012-07-21 MED ORDER — 0.9 % SODIUM CHLORIDE (POUR BTL) OPTIME
TOPICAL | Status: DC | PRN
  Administered 2012-07-21: 1000 mL

## 2012-07-21 MED ORDER — ARTIFICIAL TEARS OP OINT
TOPICAL_OINTMENT | OPHTHALMIC | Status: DC | PRN
  Administered 2012-07-21: 1 via OPHTHALMIC

## 2012-07-21 SURGICAL SUPPLY — 49 items
BLADE SURG 10 STRL SS (BLADE) ×2 IMPLANT
BLADE SURG 15 STRL LF DISP TIS (BLADE) ×1 IMPLANT
BLADE SURG 15 STRL SS (BLADE) ×1
BLADE SURG ROTATE 9660 (MISCELLANEOUS) ×2 IMPLANT
CHLORAPREP W/TINT 26ML (MISCELLANEOUS) ×2 IMPLANT
CLOTH BEACON ORANGE TIMEOUT ST (SAFETY) ×2 IMPLANT
COVER SURGICAL LIGHT HANDLE (MISCELLANEOUS) ×2 IMPLANT
DECANTER SPIKE VIAL GLASS SM (MISCELLANEOUS) ×2 IMPLANT
DERMABOND ADVANCED (GAUZE/BANDAGES/DRESSINGS) ×1
DERMABOND ADVANCED .7 DNX12 (GAUZE/BANDAGES/DRESSINGS) ×1 IMPLANT
DRAIN PENROSE 1/2X12 LTX STRL (WOUND CARE) ×2 IMPLANT
DRAPE LAPAROSCOPIC ABDOMINAL (DRAPES) ×2 IMPLANT
DRAPE UTILITY 15X26 W/TAPE STR (DRAPE) ×4 IMPLANT
ELECT CAUTERY BLADE 6.4 (BLADE) ×2 IMPLANT
ELECT REM PT RETURN 9FT ADLT (ELECTROSURGICAL) ×2
ELECTRODE REM PT RTRN 9FT ADLT (ELECTROSURGICAL) ×1 IMPLANT
GLOVE BIO SURGEON STRL SZ7.5 (GLOVE) ×4 IMPLANT
GLOVE BIOGEL PI IND STRL 7.0 (GLOVE) ×2 IMPLANT
GLOVE BIOGEL PI IND STRL 7.5 (GLOVE) ×1 IMPLANT
GLOVE BIOGEL PI INDICATOR 7.0 (GLOVE) ×2
GLOVE BIOGEL PI INDICATOR 7.5 (GLOVE) ×1
GLOVE SURG SS PI 7.0 STRL IVOR (GLOVE) ×2 IMPLANT
GOWN PREVENTION PLUS XXLARGE (GOWN DISPOSABLE) ×2 IMPLANT
GOWN STRL NON-REIN LRG LVL3 (GOWN DISPOSABLE) ×4 IMPLANT
KIT BASIN OR (CUSTOM PROCEDURE TRAY) ×2 IMPLANT
KIT ROOM TURNOVER OR (KITS) ×2 IMPLANT
MESH ULTRAPRO 3X6 7.6X15CM (Mesh General) ×2 IMPLANT
NEEDLE HYPO 25GX1X1/2 BEV (NEEDLE) ×2 IMPLANT
NS IRRIG 1000ML POUR BTL (IV SOLUTION) ×2 IMPLANT
PACK SURGICAL SETUP 50X90 (CUSTOM PROCEDURE TRAY) ×2 IMPLANT
PAD ARMBOARD 7.5X6 YLW CONV (MISCELLANEOUS) ×4 IMPLANT
PENCIL BUTTON HOLSTER BLD 10FT (ELECTRODE) ×2 IMPLANT
SPONGE LAP 18X18 X RAY DECT (DISPOSABLE) ×2 IMPLANT
SUT MNCRL AB 4-0 PS2 18 (SUTURE) ×2 IMPLANT
SUT PROLENE 2 0 SH DA (SUTURE) ×4 IMPLANT
SUT SILK 2 0 SH (SUTURE) ×2 IMPLANT
SUT SILK 3 0 (SUTURE) ×1
SUT SILK 3-0 18XBRD TIE 12 (SUTURE) ×1 IMPLANT
SUT VIC AB 0 CT1 27 (SUTURE) ×1
SUT VIC AB 0 CT1 27XBRD ANBCTR (SUTURE) ×1 IMPLANT
SUT VIC AB 2-0 SH 27 (SUTURE) ×1
SUT VIC AB 2-0 SH 27X BRD (SUTURE) ×1 IMPLANT
SUT VIC AB 3-0 SH 27 (SUTURE) ×1
SUT VIC AB 3-0 SH 27XBRD (SUTURE) ×1 IMPLANT
SYR BULB 3OZ (MISCELLANEOUS) ×2 IMPLANT
SYR CONTROL 10ML LL (SYRINGE) ×2 IMPLANT
TOWEL OR 17X24 6PK STRL BLUE (TOWEL DISPOSABLE) ×2 IMPLANT
TOWEL OR 17X26 10 PK STRL BLUE (TOWEL DISPOSABLE) ×2 IMPLANT
WATER STERILE IRR 1000ML POUR (IV SOLUTION) IMPLANT

## 2012-07-21 NOTE — Interval H&P Note (Signed)
History and Physical Interval Note:  07/21/2012 7:53 AM  Chris Payne  has presented today for surgery, with the diagnosis of LEFT INGUINAL HERNIA  The various methods of treatment have been discussed with the patient and family. After consideration of risks, benefits and other options for treatment, the patient has consented to  Procedure(s): HERNIA REPAIR INGUINAL ADULT (Left) INSERTION OF MESH (Left) as a surgical intervention .  The patient's history has been reviewed, patient examined, no change in status, stable for surgery.  I have reviewed the patient's chart and labs.  Questions were answered to the patient's satisfaction.     TOTH III,Essa Wenk S

## 2012-07-21 NOTE — Anesthesia Postprocedure Evaluation (Signed)
  Anesthesia Post-op Note  Patient: Chris Payne  Procedure(s) Performed: Procedure(s): HERNIA REPAIR INGUINAL ADULT (Left) INSERTION OF MESH (Left)  Patient Location: PACU  Anesthesia Type:General  Level of Consciousness: awake, alert  and oriented  Airway and Oxygen Therapy: Patient Spontanous Breathing  Post-op Pain: mild  Post-op Assessment: Post-op Vital signs reviewed, Patient's Cardiovascular Status Stable, Respiratory Function Stable, Patent Airway and Adequate PO intake  Post-op Vital Signs: stable  Complications: No apparent anesthesia complications

## 2012-07-21 NOTE — Anesthesia Preprocedure Evaluation (Addendum)
Anesthesia Evaluation  Patient identified by MRN, date of birth, ID band Patient awake    Reviewed: Allergy & Precautions, H&P , NPO status , Patient's Chart, lab work & pertinent test results, reviewed documented beta blocker date and time   Airway Mallampati: II TM Distance: >3 FB Neck ROM: Full    Dental  (+) Edentulous Upper and Edentulous Lower   Pulmonary shortness of breath, pneumonia -, COPD breath sounds clear to auscultation        Cardiovascular hypertension, + angina + CAD, + Past MI and + Peripheral Vascular Disease Rhythm:Regular Rate:Normal     Neuro/Psych Speech affected slightly. CVA, Residual Symptoms    GI/Hepatic GERD-  ,  Endo/Other    Renal/GU      Musculoskeletal   Abdominal   Peds  Hematology   Anesthesia Other Findings   Reproductive/Obstetrics                          Anesthesia Physical Anesthesia Plan  ASA: III  Anesthesia Plan: General   Post-op Pain Management:    Induction: Intravenous  Airway Management Planned: LMA  Additional Equipment:   Intra-op Plan:   Post-operative Plan:   Informed Consent: I have reviewed the patients History and Physical, chart, labs and discussed the procedure including the risks, benefits and alternatives for the proposed anesthesia with the patient or authorized representative who has indicated his/her understanding and acceptance.     Plan Discussed with: CRNA, Anesthesiologist and Surgeon  Anesthesia Plan Comments: (L. Inguinal hernia CAD S/P CABG 2/12 cath 2/14 2/4 grafts occluded treated medically, denies angina at present EF 55% S/P open AAA repair 6/11 S/P CVA  Plan GA with LMA Kipp Brood, MD  )        Anesthesia Quick Evaluation

## 2012-07-21 NOTE — Transfer of Care (Signed)
Immediate Anesthesia Transfer of Care Note  Patient: Chris Payne  Procedure(s) Performed: Procedure(s): HERNIA REPAIR INGUINAL ADULT (Left) INSERTION OF MESH (Left)  Patient Location: PACU  Anesthesia Type:General  Level of Consciousness: awake, alert  and oriented  Airway & Oxygen Therapy: Patient Spontanous Breathing and Patient connected to nasal cannula oxygen  Post-op Assessment: Report given to PACU RN, Post -op Vital signs reviewed and stable and Patient moving all extremities X 4  Post vital signs: Reviewed and stable  Complications: No apparent anesthesia complications

## 2012-07-21 NOTE — H&P (Signed)
Chris Payne Description: 62 year old male  05/30/2012 4:50 PM Office Visit Provider: Robyne Askew, MD  MRN: [id number] Department: [facility]            Diagnoses Reason for Visit   Left inguinal hernia - Primary  Follow-up   550.90  groin hernia          Reason For Visit History Recorded             Current Vitals - Last Recorded    BP Pulse Temp(Src) Resp Ht Wt   126/72 62 98 F (36.7 C) 18 5\' 7"  (1.702 m) 181 lb (82.101 kg)       BMI             28.34 kg/m2                 Progress Notes    Robyne Askew, MD at 05/30/2012 4:48 PM    Status: Signed             Subjective:     Patient ID: Chris Payne, male DOB: Aug 26, 1950, 62 y.o. MRN: [id number]  HPI  The patient is a 62 year old white male who we saw recently with bilateral groin pain. Most of his pain is on the left side. It was difficult to appreciate an inguinal hernia on physical exam so we had him undergo a CT scan of his pelvis. The CT scan did show a left inguinal hernia. He continues to have some occasional pain in the left groin. He denies any nausea or vomiting. He denies any chest pain or shortness of breath. He has had a recent cold which is slowly resolving.  Review of Systems  Constitutional: Negative.  HENT: Negative.  Eyes: Negative.  Respiratory: Negative.  Cardiovascular: Negative.  Gastrointestinal: Negative.  Genitourinary: Negative.  Musculoskeletal: Negative.  Skin: Negative.  Neurological: Negative.  Hematological: Negative.  Psychiatric/Behavioral: Negative.     Objective:     Physical Exam  Constitutional: He is oriented to person, place, and time. He appears well-developed and well-nourished.  HENT:  Head: Normocephalic and atraumatic.  Eyes: Conjunctivae normal and EOM are normal. Pupils are equal, round, and reactive to light.  Neck: Normal range of motion. Neck supple.  Cardiovascular: Normal rate, regular rhythm and normal heart sounds.   Pulmonary/Chest: Effort normal and breath sounds normal.  Abdominal: Soft. Bowel sounds are normal.  Genitourinary:  The patient is unable to stand straight. It is still difficult to palpate a bulge or impulse with straining in the left groin.  Musculoskeletal:  He walks with a walker secondary to a previous stroke  Neurological: He is alert and oriented to person, place, and time.  Skin: Skin is warm and dry.  Psychiatric: He has a normal mood and affect. His behavior is normal.     Assessment:     The patient has a symptomatic left inguinal hernia. Because of the risk of incarceration stimulation at think he would benefit from having this fixed. He would also like to have this done. I've discussed with him in detail the risks and benefits of the operation to fix the hernia as well as some of the technical aspects and he understands and wishes to proceed. We have received cardiac clearance and they feel as though he is low risk     Plan:     Plan for left inguinal hernia repair with mesh  Not recorded                            Patient Instructions    Plan for left inguinal hernia repair with mesh          Level of Service    PR OFFICE OUTPATIENT VISIT 10 MINUTES [40102]           All Flowsheet Templates (all recorded)    Encounter Vitals Flowsheet   Custom Formula Data Flowsheet   Anthropometrics Flowsheet                        Referring Provider    Maurice March, MD            All Charges for This Encounter    Code Description Service Date Service Provider Modifiers Quantity   [phone] PR OFFICE OUTPATIENT VISIT 10 MINUTES 05/30/2012 Robyne Askew, MD  1               Other Encounter Related Information    Allergies & Medications      Problem List      History      Patient-Entered Questionnaires      AVS Reports    Date/Time Report Action User   05/30/2012 4:42 PM After Visit Summary  Printed Robyne Askew, MD          No data filed

## 2012-07-21 NOTE — Preoperative (Signed)
Beta Blockers   Reason not to administer Beta Blockers:Hold  beta blocker due to Bradycardia (HR less than 50 bpm) 

## 2012-07-21 NOTE — Op Note (Signed)
07/21/2012  9:32 AM  PATIENT:  Chris Payne  62 y.o. male  PRE-OPERATIVE DIAGNOSIS:  LEFT INGUINAL HERNIA  POST-OPERATIVE DIAGNOSIS:  LEFT INGUINAL HERNIA  PROCEDURE:  Procedure(s): HERNIA REPAIR INGUINAL ADULT (Left) INSERTION OF MESH (Left)  SURGEON:  Surgeon(s) and Role:    * Robyne Askew, MD - Primary  PHYSICIAN ASSISTANT:   ASSISTANTS: none   ANESTHESIA:   general  EBL:  Total I/O In: 1000 [I.V.:1000] Out: -   BLOOD ADMINISTERED:none  DRAINS: none   LOCAL MEDICATIONS USED:  MARCAINE     SPECIMEN:  No Specimen  DISPOSITION OF SPECIMEN:  N/A  COUNTS:  YES  TOURNIQUET:  * No tourniquets in log *  DICTATION: .Dragon Dictation Informed consent was obtained patient was brought to the operating room placed in supine position on the operating room table. After adequate induction of general anesthesia the patient's left abdomen and groin were prepped with ChloraPrep and Betadine and draped in usual sterile manner. The left groin was then infiltrated with quarter percent Marcaine. A small incision was made from the edge of the pubic tubercle on the left towards the anterior superior iliac spine. This incision was then carried to the skin and subcutaneous tissue sharply with the electrocautery until the fascia of the external oblique was encountered. A small bridging vein was clamped with hemostats, divided, and ligated with 3-0 silk ties. The external oblique fascia was opened along its fibers towards the apex of the external ring with a 15 blade knife and Metzenbaum scissors. A wheatland retractor was deployed. Blunt dissection was carried out of the cord until it could be surrounded between 2 fingers. The ilioinguinal nerve was also identified and involved with some scar tissue. It was freed and then clamped proximally and distally, divided, and ligated with 3-0 silk ties. A half-inch Penrose drain was placed around the cord chargers for retraction purposes. The cord was  then gently skeletonized by blunt hemostat dissection until a hernia sac was identified. The hernia sac was gently separated from the rest of the cord structures. The sac was opened and there was some fat adherent to the wall of the sac. The sac was then closed with a 2-0 silk suture ligature. The sac was reduced beneath the transversalis muscle. The floor of the canal is then reapproximated with interrupted 0 Vicryl stitches. A 3 x 6 piece of ultra Pro mesh was then chosen and cut to fit. The mesh was sewn inferiorly to the shelving edge of the inguinal ligament with a running 2-0 Prolene stitch. Superiorly the mesh was sewn to the muscular aponeurotic strength layer of the transversalis with interrupted 2-0 Prolene vertical mattress stitches. Tails were cut in the mesh laterally and the tails were wrapped around the cord structures. Lateral to the cord the tails of the mesh were anchored to the shelving edge of the inguinal ligament with an interrupted 2-0 Prolene stitch. Once this was accomplished the hernia appeared to be well repaired and the mesh was in good position. The wound was then irrigated with copious amounts of saline. The fascia of the external oblique was then reapproximated with a running 2-0 Vicryl stitch. The wound was infiltrated with more cords and Marcaine. The subcutaneous fascia was closed with a running 3-0 Vicryl stitch. The skin was then closed with a running 4-0 Monocryl subcuticular stitch. Dermabond dressings were applied. The patient tolerated the procedure well. At the end of the case all needle sponge and instrument counts are correct.  The patient was then awakened and taken to recovery in stable condition.  PLAN OF CARE: Discharge to home after PACU  PATIENT DISPOSITION:  PACU - hemodynamically stable.   Delay start of Pharmacological VTE agent (>24hrs) due to surgical blood loss or risk of bleeding: not applicable

## 2012-07-22 ENCOUNTER — Encounter (HOSPITAL_COMMUNITY): Payer: Self-pay | Admitting: General Surgery

## 2012-07-28 ENCOUNTER — Encounter: Payer: Self-pay | Admitting: Family Medicine

## 2012-07-28 ENCOUNTER — Ambulatory Visit (INDEPENDENT_AMBULATORY_CARE_PROVIDER_SITE_OTHER): Payer: Medicare Other | Admitting: Family Medicine

## 2012-07-28 VITALS — BP 110/64 | HR 57 | Temp 97.0°F | Resp 20 | Ht 67.0 in | Wt 177.0 lb

## 2012-07-28 DIAGNOSIS — J069 Acute upper respiratory infection, unspecified: Secondary | ICD-10-CM

## 2012-07-28 DIAGNOSIS — G8929 Other chronic pain: Secondary | ICD-10-CM

## 2012-07-28 DIAGNOSIS — R609 Edema, unspecified: Secondary | ICD-10-CM

## 2012-07-28 DIAGNOSIS — R600 Localized edema: Secondary | ICD-10-CM

## 2012-07-28 DIAGNOSIS — M545 Low back pain: Secondary | ICD-10-CM
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
## 2012-08-02 ENCOUNTER — Ambulatory Visit: Payer: Medicare Other | Admitting: Internal Medicine

## 2012-08-05 ENCOUNTER — Inpatient Hospital Stay
Admission: RE | Admit: 2012-08-05 | Discharge: 2012-08-05 | Disposition: A | Discharge status: Home / Self Care | Payer: Medicare Other | Source: Ambulatory Visit | Attending: Family Medicine | Admitting: Family Medicine

## 2012-08-08 ENCOUNTER — Ambulatory Visit (INDEPENDENT_AMBULATORY_CARE_PROVIDER_SITE_OTHER): Payer: Medicare Other | Admitting: General Surgery

## 2012-08-08 ENCOUNTER — Encounter (INDEPENDENT_AMBULATORY_CARE_PROVIDER_SITE_OTHER): Payer: Self-pay | Admitting: General Surgery

## 2012-08-08 VITALS — BP 128/86 | HR 76 | Temp 97.6°F | Resp 18 | Ht 67.0 in | Wt 176.0 lb

## 2012-08-08 DIAGNOSIS — K4090 Unilateral inguinal hernia, without obstruction or gangrene, not specified as recurrent: Secondary | ICD-10-CM

## 2012-08-08 NOTE — Progress Notes (Signed)
Subjective:     Patient ID: Chris Payne, male   DOB: 1950/11/08, 62 y.o.   MRN: [id number]  HPI The patient is almost 3 weeks status post left inguinal hernia repair with mesh. He denies any significant pain but still has some residual soreness. His appetite is good and his bowels are working normally.  Review of Systems     Objective:   Physical Exam On exam his left inguinal incision is healing nicely with no sign of infection or significant seroma. There is no palpable evidence for recurrence of the hernia    Assessment:     The patient is 3 weeks status post left inguinal hernia repair with mesh     Plan:     At this point I would like him to refrain from any strenuous activity. We will plan to see him back in one month to check his progress

## 2012-08-08 NOTE — Patient Instructions (Signed)
No heavy lifting 

## 2012-08-11 ENCOUNTER — Telehealth: Payer: Self-pay

## 2012-08-11 MED ORDER — NYSTATIN 100000 UNIT/GM EX OINT: 1.0000 "application " | TOPICAL_OINTMENT | Freq: Every day | CUTANEOUS | Status: DC | PRN

## 2012-08-11 NOTE — Telephone Encounter (Signed)
Thanks, patient advised.  

## 2012-08-11 NOTE — Telephone Encounter (Signed)
I have sent Nystatin to his pharmacy.  It looks like that was given to him during a hospital admission, and we have not prescribed it before.  If that does not improve the rash, pt needs to be evaluated in clinic

## 2012-08-11 NOTE — Telephone Encounter (Signed)
Pt is calling because he is wanting to know the name of the cream that was given to him for his rash that he had in his groin area. He states that the rash has came back. Call back number is [phone]

## 2012-08-11 NOTE — Telephone Encounter (Signed)
Chart pulled to PA pool at nurses station [phone]

## 2012-08-11 NOTE — Telephone Encounter (Signed)
May have been nystantin ointment, he is advised, he is requesting refill, please advise.

## 2012-08-11 NOTE — Telephone Encounter (Signed)
Please pull paper chart.  Nystatin listed on med list from recent hospital admission, but no record of [place] prescribing this in Epic

## 2012-08-12 ENCOUNTER — Encounter: Payer: Self-pay | Admitting: Cardiology

## 2012-09-13 ENCOUNTER — Emergency Department (HOSPITAL_COMMUNITY)
Admission: EM | Admit: 2012-09-13 | Discharge: 2012-09-13 | Disposition: A | Discharge status: Home / Self Care | Payer: Medicare Other | Attending: Emergency Medicine | Admitting: Emergency Medicine

## 2012-09-13 ENCOUNTER — Emergency Department (HOSPITAL_COMMUNITY): Payer: Medicare Other

## 2012-09-13 ENCOUNTER — Encounter (INDEPENDENT_AMBULATORY_CARE_PROVIDER_SITE_OTHER): Payer: Medicare Other | Admitting: General Surgery

## 2012-09-13 ENCOUNTER — Encounter (HOSPITAL_COMMUNITY): Payer: Self-pay | Admitting: Emergency Medicine

## 2012-09-13 DIAGNOSIS — Z7982 Long term (current) use of aspirin: Secondary | ICD-10-CM | POA: Insufficient documentation

## 2012-09-13 DIAGNOSIS — R072 Precordial pain: Secondary | ICD-10-CM | POA: Insufficient documentation

## 2012-09-13 DIAGNOSIS — Z951 Presence of aortocoronary bypass graft: Secondary | ICD-10-CM | POA: Insufficient documentation

## 2012-09-13 DIAGNOSIS — Z87891 Personal history of nicotine dependence: Secondary | ICD-10-CM | POA: Insufficient documentation

## 2012-09-13 DIAGNOSIS — M7989 Other specified soft tissue disorders: Secondary | ICD-10-CM

## 2012-09-13 DIAGNOSIS — Z862 Personal history of diseases of the blood and blood-forming organs and certain disorders involving the immune mechanism: Secondary | ICD-10-CM | POA: Insufficient documentation

## 2012-09-13 DIAGNOSIS — R0989 Other specified symptoms and signs involving the circulatory and respiratory systems: Secondary | ICD-10-CM

## 2012-09-13 DIAGNOSIS — Z8639 Personal history of other endocrine, nutritional and metabolic disease: Secondary | ICD-10-CM | POA: Insufficient documentation

## 2012-09-13 DIAGNOSIS — Z8719 Personal history of other diseases of the digestive system: Secondary | ICD-10-CM | POA: Insufficient documentation

## 2012-09-13 DIAGNOSIS — J449 Chronic obstructive pulmonary disease, unspecified: Secondary | ICD-10-CM | POA: Insufficient documentation

## 2012-09-13 DIAGNOSIS — I252 Old myocardial infarction: Secondary | ICD-10-CM | POA: Insufficient documentation

## 2012-09-13 DIAGNOSIS — I639 Cerebral infarction, unspecified: Secondary | ICD-10-CM

## 2012-09-13 DIAGNOSIS — R059 Cough, unspecified: Secondary | ICD-10-CM | POA: Insufficient documentation

## 2012-09-13 DIAGNOSIS — M79609 Pain in unspecified limb: Secondary | ICD-10-CM

## 2012-09-13 DIAGNOSIS — I2510 Atherosclerotic heart disease of native coronary artery without angina pectoris: Secondary | ICD-10-CM

## 2012-09-13 DIAGNOSIS — Z8701 Personal history of pneumonia (recurrent): Secondary | ICD-10-CM | POA: Insufficient documentation

## 2012-09-13 DIAGNOSIS — I6350 Cerebral infarction due to unspecified occlusion or stenosis of unspecified cerebral artery: Secondary | ICD-10-CM | POA: Insufficient documentation

## 2012-09-13 DIAGNOSIS — R21 Rash and other nonspecific skin eruption: Secondary | ICD-10-CM

## 2012-09-13 DIAGNOSIS — I209 Angina pectoris, unspecified: Secondary | ICD-10-CM

## 2012-09-13 DIAGNOSIS — R05 Cough: Secondary | ICD-10-CM | POA: Insufficient documentation

## 2012-09-13 DIAGNOSIS — I771 Stricture of artery: Secondary | ICD-10-CM

## 2012-09-13 DIAGNOSIS — I10 Essential (primary) hypertension: Secondary | ICD-10-CM

## 2012-09-13 DIAGNOSIS — J4489 Other specified chronic obstructive pulmonary disease: Secondary | ICD-10-CM | POA: Insufficient documentation

## 2012-09-13 DIAGNOSIS — Z8679 Personal history of other diseases of the circulatory system: Secondary | ICD-10-CM | POA: Insufficient documentation

## 2012-09-13 DIAGNOSIS — Z79899 Other long term (current) drug therapy: Secondary | ICD-10-CM | POA: Insufficient documentation

## 2012-09-13 LAB — CBC WITH DIFFERENTIAL/PLATELET
Hemoglobin: 17.6 g/dL — ABNORMAL HIGH (ref 13.0–17.0)
Lymphs Abs: 2.1 10*3/uL (ref 0.7–4.0)
Monocytes Relative: 9 % (ref 3–12)
Neutro Abs: 3.9 10*3/uL (ref 1.7–7.7)
Neutrophils Relative %: 57 % (ref 43–77)
Platelets: 178 10*3/uL (ref 150–400)
RBC: 5.33 MIL/uL (ref 4.22–5.81)
WBC: 6.8 10*3/uL (ref 4.0–10.5)

## 2012-09-13 LAB — COMPREHENSIVE METABOLIC PANEL
ALT: 35 U/L (ref 0–53)
Albumin: 3.6 g/dL (ref 3.5–5.2)
Alkaline Phosphatase: 116 U/L (ref 39–117)
BUN: 11 mg/dL (ref 6–23)
Chloride: 100 mEq/L (ref 96–112)
Glucose, Bld: 100 mg/dL — ABNORMAL HIGH (ref 70–99)
Potassium: 4.3 mEq/L (ref 3.5–5.1)
Sodium: 138 mEq/L (ref 135–145)
Total Bilirubin: 0.4 mg/dL (ref 0.3–1.2)

## 2012-09-13 LAB — PROTIME-INR: Prothrombin Time: 13.4 seconds (ref 11.6–15.2)

## 2012-09-13 LAB — POCT I-STAT TROPONIN I: Troponin i, poc: 0 ng/mL (ref 0.00–0.08)

## 2012-09-13 LAB — D-DIMER, QUANTITATIVE: D-Dimer, Quant: 3.93 ug/mL-FEU — ABNORMAL HIGH (ref 0.00–0.48)

## 2012-09-13 MED ORDER — IOHEXOL 350 MG/ML SOLN
100.0000 mL | Freq: Once | INTRAVENOUS | Status: AC | PRN
  Administered 2012-09-13: 100 mL via INTRAVENOUS

## 2012-09-13 NOTE — ED Notes (Signed)
Patient transported to CT 

## 2012-09-13 NOTE — ED Notes (Signed)
Pt transported to Vascular Lab

## 2012-09-13 NOTE — Progress Notes (Signed)
*  Preliminary Results*  VENOUS Bilateral lower extremity venous duplex completed. Bilateral lower extremities are negative for deep vein thrombosis. There is no evidence of Baker's cyst bilaterally.    ARTERIAL  ABI completed:    RIGHT    LEFT    PRESSURE WAVEFORM  PRESSURE WAVEFORM  BRACHIAL 143 Triphasic BRACHIAL 146 Biphasic  DP 140 Biphasic DP 115 Biphasic  AT   AT    PT  Unable to insonate PT 126 Biphasic  PER   PER    GREAT TOE  NA GREAT TOE  NA    RIGHT LEFT  ABI 0.96 0.86   The right ABI is within normal limits, the left ABI is suggestive of mild arterial insufficiency.    09/13/2012 4:38 PM Gertie Fey, RDMS, RDCS

## 2012-09-13 NOTE — ED Provider Notes (Signed)
History     CSN: [id number]  Arrival date & time 09/13/12  1032   First MD Initiated Contact with Patient 09/13/12 1147      Chief Complaint  Patient presents with  . Leg Swelling  . Rash    (Consider location/radiation/quality/duration/timing/severity/associated sxs/prior treatment) HPI Comments: Chris Payne is a 62 y/o M with PMHx of HTN, stroke, CAD (CABG 05/2010 LIMA to LAD performed by Dr. Dorris Fetch), carotid stenosis, PAD, COPD, angina pain presenting to the ED with swelling to the legs and feet that has been ongoing for the past 3-4 months. Patient stated that he has been having a "pin and needles," burning, sensation to the legs bilaterally that has been constant. Patient noted that the swelling of the legs are intermittent for the past 3-4 months - stated that every once and a while there is swelling in the legs, but noticed that it has gotten worse. Stated that he has been having coldness to the feet bilaterally. Patient reported using a walker to get around. Patient stated that he has not seen is cardiologist in the past 2-3 months - stated that he is on a water pill, but only takes it once in a while, as per patient he stated that he took a water pill yesterday, but that was the first time in a month where he took his pill. Patient on Lazix 20 mg scheduled to take one tablet by mouth daily. Patient reported that he has been having ongoing cough with bringing of phlegm that has been ongoing for the past couple of months - stated continuous since he stopped smoking. Patient reported having mild episodes of chest pain, localized to the left side of the chest described as a pressure sensation that is sporadic at rest and with activity, without radiation to the left arm - stated that he has an episode of chest pain at least once per month that has been ongoing for the past couple of years - stated that the pain is alleviated when he takes his nitroglycerin. Patient reported that he has  been having a rash in his groin that has been ongoing for the past 3-4 months - stated that the rash comes and goes. Reported that he has been seen by Dr. Audria Nine regarding rash and was placed on nystatin - stated that he applies the cream "once in a while," does not follow prescription instructions - denied penile discharge, penile pain, penile swelling, testicular swelling, pruritus. Denied fever, chills, shortness of breathe, dyspnea, orthopnea, nasal congestion, gi symptoms, urinary symptoms, visual distortions, headache.  Reviewed patient's chart - patient had cath performed in 05/2012 - two out of the four grafts are vastly occluded. Most recent documented ejection fraction noted to be 55%.     PCP Dr. Audria Nine Cardiologist Dr. Peter Garter at [facility]    The history is provided by the patient. No language interpreter was used.    Past Medical History  Diagnosis Date  . Hypertension   . Stroke     h/o pontine CVA  . AAA (abdominal aortic aneurysm)     s/p repair 6/11  . Hyperlipidemia   . CAD (coronary artery disease)     s/p CABG 2/12:  LIMA to LAD, SVG to diagonal-1, SVG to ramus intermedius,, SVG to AM (Dr. Dorris Fetch) ;  b.  Myoview 10/13:  inf infarct with very mild peri-infarct ischemia, EF 49%, inf HK; c. Cath 2/25 showed 2/4 occluded grafts & high grade stenosis in the LAD distal to the  LIMA insertion, not amenable to PCI - Med Rx  . Carotid stenosis     a.  dopplers 8/12: LICA 50-69%;  b. Carotid dopplers 3/14:  R 0-39%, L 60-79%, repeat 6 mos  . PAD (peripheral artery disease)     ABIs 4/12:  R 0.88, L 0.92; R SFA 40%, L CFA 50%, L SFA 50-60%  . COPD (chronic obstructive pulmonary disease)   . Inguinal hernia   . Thyroid nodule     incidental finding on carotid doppler 3/14 => thyroid U/S ordered  . Myocardial infarction   . Anginal pain     occ last 1-2 months ago  . Shortness of breath   . Pneumonia     hx  . GERD (gastroesophageal reflux disease)     Past  Surgical History  Procedure Laterality Date  . Hip surgery  40 years ago    left hip bone removal and pinning  . Abdominal aortic aneurysm repair  10-03-2009  . Exploratory laparotomy  09/2009    ligation of lumbar arteries  . Coronary artery bypass graft      LIMA to the LAD, SVG to first diagonal, SVG to ramus intermediate, SVG to acute marginal. EF 50%. 2/12  . External ear surgery Left     laceration child  . Inguinal hernia repair Left 07/21/2012    Procedure: HERNIA REPAIR INGUINAL ADULT;  Surgeon: Robyne Askew, MD;  Location: [facility] OR;  Service: General;  Laterality: Left;  . Insertion of mesh Left 07/21/2012    Procedure: INSERTION OF MESH;  Surgeon: Robyne Askew, MD;  Location: [facility] OR;  Service: General;  Laterality: Left;    Family History  Problem Relation Age of Onset  . Alcohol abuse Father   . Cancer Sister     History  Substance Use Topics  . Smoking status: Former Smoker -- 1.00 packs/day for 30 years    Types: Cigarettes    Quit date: 01/25/2010  . Smokeless tobacco: Never Used  . Alcohol Use: No     Comment: former drinker      Review of Systems  Constitutional: Negative for fever, chills and fatigue.  HENT: Negative for hearing loss, ear pain, congestion, sore throat, rhinorrhea, trouble swallowing, neck pain and neck stiffness.   Eyes: Negative for photophobia, pain and visual disturbance.  Respiratory: Positive for cough. Negative for chest tightness and shortness of breath.   Cardiovascular: Positive for chest pain and leg swelling.  Gastrointestinal: Negative for nausea, vomiting, abdominal pain, diarrhea, constipation, blood in stool and anal bleeding.  Genitourinary: Negative for dysuria, urgency, hematuria, flank pain, decreased urine volume, discharge, penile swelling, scrotal swelling, difficulty urinating, penile pain and testicular pain.  Musculoskeletal: Negative for back pain and arthralgias.  Skin: Positive for rash (bilateral groin). Negative  for pallor and wound.  Neurological: Negative for dizziness, weakness, light-headedness, numbness and headaches.  All other systems reviewed and are negative.    Allergies  Review of patient's allergies indicates no known allergies.  Home Medications   Current Outpatient Rx  Name  Route  Sig  Dispense  Refill  . aspirin EC 81 MG tablet   Oral   Take 81 mg by mouth daily.         Marland Kitchen atorvastatin (LIPITOR) 40 MG tablet   Oral   Take 1 tablet (40 mg total) by mouth daily.   30 tablet   11   . fenofibrate 54 MG tablet   Oral  Take 54 mg by mouth daily.         . furosemide (LASIX) 20 MG tablet   Oral   Take 1 tablet (20 mg total) by mouth daily.   30 tablet   11   . isosorbide mononitrate (IMDUR) 30 MG 24 hr tablet   Oral   Take 30 mg by mouth daily.         . metoprolol succinate (TOPROL-XL) 25 MG 24 hr tablet   Oral   Take 25 mg by mouth daily.         . nitroGLYCERIN (NITROSTAT) 0.4 MG SL tablet   Sublingual   Place 1 tablet (0.4 mg total) under the tongue every 5 (five) minutes as needed for chest pain.   25 tablet   3   . gabapentin (NEURONTIN) 300 MG capsule   Oral   Take 600 mg by mouth 3 (three) times daily.         Marland Kitchen nystatin ointment (MYCOSTATIN)   Topical   Apply 1 application topically daily as needed (for leg.). For leg area           BP 154/87  Pulse 46  Temp(Src) 98.4 F (36.9 C) (Oral)  Resp 17  Ht 5\' 7"  (1.702 m)  Wt 175 lb (79.379 kg)  BMI 27.4 kg/m2  SpO2 95%  Physical Exam  Nursing note and vitals reviewed. Constitutional: He is oriented to person, place, and time. He appears well-developed and well-nourished. No distress.  HENT:  Head: Normocephalic and atraumatic.  Mouth/Throat: Oropharynx is clear and moist. No oropharyngeal exudate.  Uvula midline, symmetrical elevation  Eyes: Conjunctivae and EOM are normal. Pupils are equal, round, and reactive to light. Right eye exhibits no discharge. Left eye exhibits no  discharge.  Neck: Normal range of motion. Neck supple. No tracheal deviation present. No thyromegaly present.  Negative neck stiffness Negative nuchal rigidity Negative lymphadenopathy  Cardiovascular: Normal rate, regular rhythm and normal heart sounds.  Exam reveals no friction rub.   No murmur heard. Pulses:      Radial pulses are 2+ on the right side, and 2+ on the left side.       Dorsalis pedis pulses are 1+ on the right side, and 1+ on the left side.  Doppler used for pedal pulses - audible pedal pulses  Warmth above the knee bilaterally Coolness to touch of skin below the knee and feet bilaterally  Pulmonary/Chest: Effort normal. No respiratory distress. He has decreased breath sounds in the right lower field and the left lower field. He has no wheezes. He has rhonchi in the right middle field and the left upper field. He has rales.  Genitourinary:     Lymphadenopathy:    He has no cervical adenopathy.  Neurological: He is alert and oriented to person, place, and time. No cranial nerve deficit. He exhibits normal muscle tone. Coordination normal.  Cranial nerves III-XII grossly intact  Skin: Skin is warm and dry. Rash noted. He is not diaphoretic. There is erythema.  Rash in groin  Psychiatric: He has a normal mood and affect. His behavior is normal. Thought content normal.    ED Course  Procedures (including critical care time)  Patient placed on oxygen therapy via nasal cannula 2L due to saturation dropping to 87% on room air Placed on cardiac monitoring and pulse oximetry monitoring.     Date: 09/13/2012  Rate: 48  Rhythm: sinus bradycardia  QRS Axis: left  Intervals: normal  ST/T Wave  abnormalities: nonspecific ST/T changes  Conduction Disutrbances:none  Narrative Interpretation:   Old EKG Reviewed: unchanged    Labs Reviewed  CBC WITH DIFFERENTIAL - Abnormal; Notable for the following:    Hemoglobin 17.6 (*)    All other components within normal limits    COMPREHENSIVE METABOLIC PANEL - Abnormal; Notable for the following:    Glucose, Bld 100 (*)    All other components within normal limits  D-DIMER, QUANTITATIVE - Abnormal; Notable for the following:    D-Dimer, Quant 3.93 (*)    All other components within normal limits  PROTIME-INR  PRO B NATRIURETIC PEPTIDE  POCT I-STAT TROPONIN I   Dg Chest 2 View  09/13/2012   *RADIOLOGY REPORT*  Clinical Data: Shortness of breath  CHEST - 2 VIEW  Comparison: 06/19/2012  Findings: Bibasilar scarring again noted.  Evidence of CABG noted. Aorta is ectatic and unfolded.  Trace right pleural fluid noted. No new pulmonary opacity is identified.  The patient is rotated to the right.  IMPRESSION: Stable bibasilar scarring with trace pleural effusion on the right. No new acute finding otherwise.   Original Report Authenticated By: Christiana Pellant, M.D.   Ct Angio Chest Pe W/cm &/or Wo Cm  09/13/2012   *RADIOLOGY REPORT*  Clinical Data: Chest pain.  Elevated D-dimer.  Diaphoresis.  CT ANGIOGRAPHY CHEST  Technique:  Multidetector CT imaging of the chest using the standard protocol during bolus administration of intravenous contrast. Multiplanar reconstructed images including MIPs were obtained and reviewed to evaluate the vascular anatomy.  Contrast: OMNIPAQUE IOHEXOL 350 MG/ML SOLN  Comparison: CTA chest or dissection 06/21/2010.  Findings: Contrast opacification of the pulmonary arteries is excellent.  Respiratory motion blurred images of the bases. Overall, the study is of good diagnostic quality.  No filling defects within either main pulmonary artery or their branches in either lung to suggest pulmonary embolism.  Prior sternotomy for CABG.  Patent left coronary patent graft to the LAD. I cannot comment on whether or not the other grafts are patent due to contrast bolus timing.  Ectatic ascending thoracic aorta measuring approximately 4.5 cm diameter, unchanged since the prior examination.  No pericardial  effusion.  Heart enlarged with left ventricular predominance.  Emphysematous changes throughout both lungs, unchanged.  Scarring deep in the lower lobes and in the right middle lobe and lingula, unchanged.  No confluent airspace consolidation.  No pulmonary parenchymal nodules or masses.  No pleural effusions.  Central airways patent with moderate to marked bronchial wall thickening.  No significant mediastinal, hilar, or axillary lymphadenopathy. Visualized thyroid gland unremarkable.  Numerous cysts within the visualized liver as noted previously, unchanged.  Visualized extreme upper abdomen otherwise unremarkable.  Bone window images demonstrate lower thoracic spondylosis.  IMPRESSION:  1.  No evidence of pulmonary embolism. 2.  COPD/emphysema.  Scarring in the lower lobes, right middle lobe, and lingula.  No acute cardiopulmonary disease. 3.  Ectatic ascending thoracic aorta measuring 4.5 cm diameter, unchanged since February, 2012.   Original Report Authenticated By: Hulan Saas, M.D.     1. Leg swelling   2. Groin rash   3. Anginal pain   4. HTN (hypertension)   5. Stroke   6. CAD (coronary artery disease)   7. Arterial insufficiency       MDM  Patient afebrile, normotensive, non-tachypneic, adequate saturation on room air.  Patient mildly bradycardic.   Diminished pedal pulses bilaterally - arterial doppler ordered to rule out arterial insufficiency.  EKG negative for STEMI/NSTEMI, negative  troponins (0.00) CBC negative findings CMP negative findings BNP 117.4 - decreased level since 10/17/2011 when level was 131.2 D-dimer elevated (3.93) Chest xray stable bibasiliar scarring with trace of pleural effusion on the right - no acute findings.  Negative Lower Extremity DVT noted bilaterally Negative ABI findings to left lower extremity Positive ABI findings to right lower extremity - suspicious of mild arterial insufficiency  Based on decreased saturation on room air to 87%,  elevated D-dimer (3.93), and intermittent episodes of chest pain - CT angio ordered to rule out PE - discussed with Dr. Lars Mage, agreed to imaging requirement.  CT angio chest - negative pulmonary embolism noted. COPD/emphysema noted - scarring of the lower lobes, right middle lobe, - no cardiopulmonary disease noted. Ectatic ascending thoracic aorta measuring 4.5cm diameter - unchanged since 05/2010.   Arterial insufficiency noted to right lower extremity. Negative PE noted on CT angio. COPD noted to CT chest Angio. Negative changes to ectatic ascending thoracic aorta. Negative pneumonia findings. Patient bradycardic while in ED setting, blood pressure controlled - patient on beta-blocker at home. Adequate saturation on room air. Discussed findings and case with Dr. Lars Mage - assessed and cleared patient for discharge. Discharged patient. Referred patient to PCP, cardiologist, and vascular physician - discussed patient to see these physicians no later than next week. Discussed with patient the importance of taking medications as prescribed at home - discussed with patient to take medications at home at all times. Discussed with patient to continue applying nystatin cream to groin region - discussed that it may take several weeks to be cured. Discussed with patient to rest and stay hydrated. Discussed with patient to legs elevated and to stay in motion to aid in proper flow. Discussed with patient to monitor symptoms and if symptoms are to worsen or change to report back to the ED. Patient agreed to plan of care, understood, all questions answered.               Raymon Mutton, PA-C 09/13/12 2119  Raymon Mutton, PA-C 09/14/12 0015

## 2012-09-13 NOTE — ED Provider Notes (Signed)
Patient states that he started getting swelling and a pins and needle feeling in his lower legs about 3 months ago. He denies any chest pain or shortness of breath. He states he did have chest pain last week that was relieved with one nitroglycerin. He also complains of his feet feeling cold and he now has to use a walker to get around. Patient states he used to smoke however he states he quit 3 years ago.  Patient is alert and cooperative. He's noted to have mild diffuse nonpitting edema of his feet with purplish mottling of his skin worse on the left foot than the right. His feet are cold to touch in his lower cavities are cool and show an area just below the knee where his extremities get warm again. He has no palpable peripheral dorsalis pedis.  Review of his prior records show he had a cardiac catheterization done in February which showed two out of four of his grafts are occluded with severe native coronary artery disease. His ejection fraction was 55%.  Medical screening examination/treatment/procedure(s) were conducted as a shared visit with non-physician practitioner(s) and myself.  I personally evaluated the patient during the encounter  Devoria Albe, MD, Franz Dell, MD 09/13/12 [phone]

## 2012-09-13 NOTE — ED Notes (Signed)
Pt reports pins and needles sensation across both feet for the last 3-4 months. Also noted increased foot swelling. Reports taking medications as directed. Pt also c/o rash to groin.

## 2012-09-14 NOTE — ED Provider Notes (Signed)
See prior note   Ward Givens, MD 09/14/12 1054

## 2012-09-15 ENCOUNTER — Other Ambulatory Visit: Payer: Self-pay | Admitting: Physician Assistant

## 2012-09-16 ENCOUNTER — Encounter: Payer: Self-pay | Admitting: Family Medicine

## 2012-09-16 ENCOUNTER — Ambulatory Visit (INDEPENDENT_AMBULATORY_CARE_PROVIDER_SITE_OTHER): Payer: Medicare Other | Admitting: Family Medicine

## 2012-09-16 VITALS — BP 128/80 | HR 63 | Temp 97.9°F | Resp 16 | Ht 67.0 in | Wt 168.5 lb

## 2012-09-16 DIAGNOSIS — R9389 Abnormal findings on diagnostic imaging of other specified body structures: Secondary | ICD-10-CM

## 2012-09-16 DIAGNOSIS — G894 Chronic pain syndrome: Secondary | ICD-10-CM

## 2012-09-16 DIAGNOSIS — R600 Localized edema: Secondary | ICD-10-CM

## 2012-09-16 DIAGNOSIS — R609 Edema, unspecified: Secondary | ICD-10-CM

## 2012-09-16 DIAGNOSIS — R946 Abnormal results of thyroid function studies: Secondary | ICD-10-CM

## 2012-09-16 MED ORDER — GABAPENTIN 300 MG PO CAPS: ORAL_CAPSULE | ORAL | Status: DC

## 2012-09-16 NOTE — Progress Notes (Signed)
S:  This 62 y.o. Cauc male has chronic lower ext edema, PAD, CAD and chronic pain syndrome related to lumbar disc disease. He was seen 3 days ago in ED for leg swelling. Medication noncompliance w/ Lasix reported. Pt has resumed this medication daily but still reports some soreness and tightness in lower legs. His main concern is ongoing pain. His daily activities are limited due to pain; Gabapentin does help with nocturnal pain and causes sedation. His daily routine consists of Getting up, having cup of coffee/light meal, others minor ADLs and watching television. He is not very active and could tolerate an increase of Gabapentin dose.  Appointments w/ Cardiology [facility]) and [facility] within next few weeks. Pt states he was never contacted about Endocrine evaluation w/ Dr. Elvera Lennox; he is agreeable to having that referral rescheduled. He denies any swallowing difficulties, voice change, temperature intolerance, change in skin or hair or GI problems.  PMHx, Soc Hx and Problem List reviewed. I also reviewed last ED visit, recent labs as well as the thyroid ultrasound report.  ROS: As per HPI.   O: Filed Vitals:   09/16/12 1452  BP: 128/80  Pulse: 63  Temp: 97.9 F (36.6 C)  Resp: 16   GEN: In NAD; Appears older than stated age and chronically ill. HENT: [place]/AT; EOMI w/ injected conj and dull sclerae. EACs/ nose/oroph normal. COR: RRR. 1+ pretibial edema. LUNGS: CTA; no wheezes or rales. Normal resp rate and effort. SKIN: W&D; Fair turgor. No rashes or pallor. MS: MAEs; degenerative changes in major joints. NEURO; A&O x 3; CNs grossly intact. PSYCH: Flat affect, calm demeanor, occasional eye contact. Speech pattern and thought content are normal. Judgement is sound.  A/P: Chronic pain syndrome- Increase Gabapentin daily dose by adding a late AM dose (1 capsule 300 mg); continue 2 capsules at bedtime.  Bilateral lower extremity edema- Follow-up w/ Cardiology;  continue daily Lasix and leg elevation.  Abnormal ultrasound of thyroid gland - Reviewed thyroid imaging w/ pt and attempts to contact him re: referral. He agrees to see specialist.  Plan: Ambulatory referral to Endocrinology

## 2012-09-16 NOTE — Patient Instructions (Signed)
Medication for chronic pain- GABAPENTIN 300 mg  Take 1 capsule about 10 AM and continue taking 2 capsules at bedtime.

## 2012-09-20 ENCOUNTER — Encounter: Payer: Medicare Other | Admitting: Nurse Practitioner

## 2012-09-27 ENCOUNTER — Ambulatory Visit (INDEPENDENT_AMBULATORY_CARE_PROVIDER_SITE_OTHER): Payer: Medicare Other | Admitting: Nurse Practitioner

## 2012-09-27 ENCOUNTER — Encounter: Payer: Self-pay | Admitting: Nurse Practitioner

## 2012-09-27 VITALS — BP 160/100 | HR 56 | Ht 67.0 in | Wt 169.8 lb

## 2012-09-27 DIAGNOSIS — R609 Edema, unspecified: Secondary | ICD-10-CM

## 2012-09-27 NOTE — Progress Notes (Signed)
Curtis Sites Date of Birth: 01-09-1951 Medical Record #[id number]  History of Present Illness: Mr. Cheatum is seen back today for a post ER visit. Seen for Dr. Antoine Poche. He has a history of CAD, with CABG in 05/2010, AAA s/p repair in 09/2009, past stroke, carotid stenosis, PAD, HTN, HLD, COPD and mild LV dysfunction. Echo from 05/2010 showed an EF of 45 to 50%. Also with diastolic dysfunction. Admitted earlier this year in February with chest pain. LHC 06/21/12: LIMA-LAD patent with high-grade disease distal to graft insertion, SVG-RI occluded, SVG-PDA occluded, SVG-diagonal patent, EF 55%. Patient was noted to have severe native vessel disease with 2 of 4 grafts occluded and high-grade stenosis in the distal LAD beyond the LIMA insertion. This was not amenable to PCI and medical management was recommended. He was placed on isosorbide.  He was most recently in the ER with swelling of his legs. Had extensive evaluation with EKG, labs, elevated d dimer and negative CT angio of the chest. Ectatic ascending thoracic aorta was 4.5 and unchanged since 05/2010. Had bilateral ABIs that was suspicious for mild arterial insufficiency. Negative venous dopplers for DVT. He was to see his PCP and vascular surgeon along with [place].   Comes back today. He is here alone. Asking for pain medicine for his back and legs. Still with some swelling in his feet and ankles. Had stopped taking his fluid pills prior to the ER visit and is now back on. Still with some swelling. Weight is coming down. Some cough. Says he is not short of breath. His legs stay cold. Occasional chest pain and will use NTG on occasion - not recently. Says he is not smoking. He is wanting a note for his step son, Cherylynn Ridges, apparently he is behind in his child support. Mr. Pollan would like a note saying that Omelia Blackwater helps with transportation for he and his wife.   Current Outpatient Prescriptions on File Prior to Visit  Medication Sig Dispense Refill    . aspirin EC 81 MG tablet Take 81 mg by mouth daily.      Marland Kitchen atorvastatin (LIPITOR) 40 MG tablet Take 1 tablet (40 mg total) by mouth daily.  30 tablet  11  . fenofibrate 54 MG tablet Take 54 mg by mouth daily.      . furosemide (LASIX) 20 MG tablet Take 1 tablet (20 mg total) by mouth daily.  30 tablet  11  . gabapentin (NEURONTIN) 300 MG capsule Take 1 capsule every morning and 2 capsules at bedtime.  90 capsule  3  . isosorbide mononitrate (IMDUR) 30 MG 24 hr tablet Take 30 mg by mouth daily.      . metoprolol succinate (TOPROL-XL) 25 MG 24 hr tablet Take 25 mg by mouth daily.      . nitroGLYCERIN (NITROSTAT) 0.4 MG SL tablet Place 1 tablet (0.4 mg total) under the tongue every 5 (five) minutes as needed for chest pain.  25 tablet  3  . nystatin ointment (MYCOSTATIN) Apply 1 application topically daily as needed (for leg.). For leg area       No current facility-administered medications on file prior to visit.    No Known Allergies  Past Medical History  Diagnosis Date  . Hypertension   . Stroke     h/o pontine CVA  . AAA (abdominal aortic aneurysm)     s/p repair 6/11  . Hyperlipidemia   . CAD (coronary artery disease)     s/p CABG 2/12:  LIMA to LAD, SVG to diagonal-1, SVG to ramus intermedius,, SVG to AM (Dr. Dorris Fetch) ;  b.  Myoview 10/13:  inf infarct with very mild peri-infarct ischemia, EF 49%, inf HK; c. Cath 2/25 showed 2/4 occluded grafts & high grade stenosis in the LAD distal to the LIMA insertion, not amenable to PCI - Med Rx  . Carotid stenosis     a.  dopplers 8/12: LICA 50-69%;  b. Carotid dopplers 3/14:  R 0-39%, L 60-79%, repeat 6 mos  . PAD (peripheral artery disease)     ABIs 4/12:  R 0.88, L 0.92; R SFA 40%, L CFA 50%, L SFA 50-60%  . COPD (chronic obstructive pulmonary disease)   . Inguinal hernia   . Thyroid nodule     incidental finding on carotid doppler 3/14 => thyroid U/S ordered  . Myocardial infarction   . Anginal pain     occ last 1-2 months ago   . Shortness of breath   . Pneumonia     hx  . GERD (gastroesophageal reflux disease)     Past Surgical History  Procedure Laterality Date  . Hip surgery  40 years ago    left hip bone removal and pinning  . Abdominal aortic aneurysm repair  10-03-2009  . Exploratory laparotomy  09/2009    ligation of lumbar arteries  . Coronary artery bypass graft      LIMA to the LAD, SVG to first diagonal, SVG to ramus intermediate, SVG to acute marginal. EF 50%. 2/12  . External ear surgery Left     laceration child  . Inguinal hernia repair Left 07/21/2012    Procedure: HERNIA REPAIR INGUINAL ADULT;  Surgeon: Robyne Askew, MD;  Location: [facility] OR;  Service: General;  Laterality: Left;  . Insertion of mesh Left 07/21/2012    Procedure: INSERTION OF MESH;  Surgeon: Robyne Askew, MD;  Location: [facility] OR;  Service: General;  Laterality: Left;    History  Smoking status  . Former Smoker -- 1.00 packs/day for 30 years  . Types: Cigarettes  . Quit date: 01/25/2010  Smokeless tobacco  . Never Used    History  Alcohol Use No    Comment: former drinker    Family History  Problem Relation Age of Onset  . Alcohol abuse Father   . Cancer Sister     Review of Systems: The review of systems is per the HPI.  All other systems were reviewed and are negative.  Physical Exam: BP 160/100  Pulse 56  Ht 5\' 7"  (1.702 m)  Wt 169 lb 12.8 oz (77.021 kg)  BMI 26.59 kg/m2 Recheck of his BP is 130/90 in both arms by me. Patient appears chronically ill but in no acute distress. Using a walker. Smells of tobacco. Congested cough. Skin is warm and dry. Color is normal.  HEENT is unremarkable. Normocephalic/atraumatic. PERRL. Sclera are nonicteric. Neck is supple. No masses. No JVD. Lungs are clear. Cardiac exam shows a regular rate and rhythm. Abdomen is soft. Extremities are with 1+ ankle/pedal edema. Legs are splotchy in appearance and cook to touch. Gait and ROM are intact. He is using a walker. No gross  neurologic deficits noted.  LABORATORY DATA:  Lab Results  Component Value Date   WBC 6.8 09/13/2012   HGB 17.6* 09/13/2012   HCT 51.4 09/13/2012   PLT 178 09/13/2012   GLUCOSE 100* 09/13/2012   CHOL 170 08/10/2011   TRIG 116 08/10/2011   HDL 38*  08/10/2011   LDLCALC 109* 08/10/2011   ALT 35 09/13/2012   AST 24 09/13/2012   NA 138 09/13/2012   K 4.3 09/13/2012   CL 100 09/13/2012   CREATININE 0.78 09/13/2012   BUN 11 09/13/2012   CO2 27 09/13/2012   TSH 0.811 06/19/2012   INR 1.03 09/13/2012   HGBA1C 5.4 08/10/2011     Assessment / Plan: 1. Swelling of the lower extremities - had been off of his Lasix. Now back on. Will update his echo. See him back as planned later this month. His overall prognosis looks tenuous at best.   2. CAD - prior CABG with 2/4 grafts patent - managed medically  3. Mild arterial insufficiency on ABI's of the lower extremities - seeing [facility] later this month.   4. Back/leg pain - asking for narcotics - I have deferred this to his PCP.   I did write a note saying that his step son, Cherylynn Ridges helps provide his transportation (nothing else stated) for he and his wife.   Patient is agreeable to this plan and will call if any problems develop in the interim.   Rosalio Macadamia, RN, ANP-C [facility] [address] [place], [place]  [address]

## 2012-09-27 NOTE — Patient Instructions (Signed)
We are going to get an ultrasound of your heart  Stay on your current medicines  Keep your follow up visits as planned  Minimize your salt  Elevate your legs as much as possible  Call the [facility] office at [phone] if you have any questions, problems or concerns.

## 2012-09-28 ENCOUNTER — Encounter (INDEPENDENT_AMBULATORY_CARE_PROVIDER_SITE_OTHER): Payer: Self-pay | Admitting: General Surgery

## 2012-09-29 ENCOUNTER — Ambulatory Visit: Payer: Medicare Other | Admitting: Internal Medicine

## 2012-10-05 ENCOUNTER — Ambulatory Visit (HOSPITAL_COMMUNITY): Payer: Medicare Other | Attending: Cardiology

## 2012-10-05 DIAGNOSIS — I2510 Atherosclerotic heart disease of native coronary artery without angina pectoris: Secondary | ICD-10-CM | POA: Insufficient documentation

## 2012-10-05 DIAGNOSIS — J4489 Other specified chronic obstructive pulmonary disease: Secondary | ICD-10-CM | POA: Insufficient documentation

## 2012-10-05 DIAGNOSIS — I739 Peripheral vascular disease, unspecified: Secondary | ICD-10-CM | POA: Insufficient documentation

## 2012-10-05 DIAGNOSIS — I714 Abdominal aortic aneurysm, without rupture, unspecified: Secondary | ICD-10-CM | POA: Insufficient documentation

## 2012-10-05 DIAGNOSIS — Z87891 Personal history of nicotine dependence: Secondary | ICD-10-CM | POA: Insufficient documentation

## 2012-10-05 DIAGNOSIS — J449 Chronic obstructive pulmonary disease, unspecified: Secondary | ICD-10-CM | POA: Insufficient documentation

## 2012-10-05 DIAGNOSIS — E785 Hyperlipidemia, unspecified: Secondary | ICD-10-CM | POA: Insufficient documentation

## 2012-10-05 DIAGNOSIS — R609 Edema, unspecified: Secondary | ICD-10-CM

## 2012-10-05 DIAGNOSIS — I6529 Occlusion and stenosis of unspecified carotid artery: Secondary | ICD-10-CM | POA: Insufficient documentation

## 2012-10-05 NOTE — Progress Notes (Signed)
Echocardiogram performed.  

## 2012-10-07 ENCOUNTER — Ambulatory Visit (INDEPENDENT_AMBULATORY_CARE_PROVIDER_SITE_OTHER): Payer: Medicare Other | Admitting: Internal Medicine

## 2012-10-07 ENCOUNTER — Encounter: Payer: Self-pay | Admitting: Internal Medicine

## 2012-10-07 VITALS — BP 128/72 | HR 85 | Temp 98.8°F | Resp 16 | Ht 65.5 in | Wt 175.0 lb

## 2012-10-07 DIAGNOSIS — E042 Nontoxic multinodular goiter: Secondary | ICD-10-CM

## 2012-10-07 NOTE — Progress Notes (Signed)
Subjective:     Patient ID: Chris Payne, male   DOB: 12/17/1950, 62 y.o.   MRN: [id number]  HPI Mr. Siever is a pleasant 62 year old man, referred by his cardiologist, Dr. Antoine Poche, for management of recently diagnosed nontoxic multinodular goiter. The patient is here with his wife.  Patient had a carotid Doppler exam in 06/2012, with an incidental finding of a right thyroid nodule. He had a thyroid U/S performed (08/05/2012): slightly enlarged right thyroid lobe, with subcentimeter fluid-filled cysts, containing inspissated colloid with "comet tail" artifact. There are also some mixed/solid nodules, <5 mm. I reviewed the images along with the patient and his wife.  He does not have any history of thyroid disease, her latest TSH was normal, 0.81 on 06/19/2012. He denies weight changes, palpitations, tremors, anxiety, hyperdefecation, insomnia, to suggest hyperthyroidism. He also denies neck compression spx: hoarseness, choking, dysphagia, shortness of breath with lying down. He does not feel nodules in his neck.   No FH of thyroid cancer. No RxTx to head or neck.  PMH: I reviewed his chart including office notes, telephone notes, labs, and available scanned records - patient has a complicated medical history: he is status post AAA repair in 09/2009, has a history of CAD-status post CABG on 05/2010 with high-grade stenosis in distal LAD not amenable to PCI (per recent cath), on medical management, also diastolic CHF with mild LV dysfunction, carotid stenosis, history of stroke-pontine CVA, hypertension, COPD, GERD, left inguinal hernia. He was in the ED on 09/13/2012 for bilateral leg swelling and anginal pain after he stopped taking his Lasix.   Review of Systems Constitutional: no weight gain/loss, no fatigue, no subjective hyperthermia/hypothermia Eyes: no blurry vision, no xerophthalmia ENT: no sore throat, no nodules palpated in throat, no dysphagia/odynophagia, no  hoarseness Cardiovascular: no CP/SOB/palpitations/+ leg swelling Respiratory: no cough/SOB Gastrointestinal: no N/V/D/+ C/+ GERD Musculoskeletal: no muscle/joint aches Skin: no rashes, + easy bruising Neurological: no tremors/numbness/tingling/dizziness Psychiatric: no depression/anxiety Difficulty with erections  Past Surgical History  Procedure Laterality Date  . Hip surgery  40 years ago    left hip bone removal and pinning  . Abdominal aortic aneurysm repair  10-03-2009  . Exploratory laparotomy  09/2009    ligation of lumbar arteries  . Coronary artery bypass graft      LIMA to the LAD, SVG to first diagonal, SVG to ramus intermediate, SVG to acute marginal. EF 50%. 2/12  . External ear surgery Left     laceration child  . Inguinal hernia repair Left 07/21/2012    Procedure: HERNIA REPAIR INGUINAL ADULT;  Surgeon: Robyne Askew, MD;  Location: [facility] OR;  Service: General;  Laterality: Left;  . Insertion of mesh Left 07/21/2012    Procedure: INSERTION OF MESH;  Surgeon: Robyne Askew, MD;  Location: [facility] OR;  Service: General;  Laterality: Left;   History   Social History  . Marital Status: Married    Spouse Name: N/A    Number of Children: 0   Occupational History  . Retired Metallurgist.    Social History Main Topics  . Smoking status: Former Smoker -- 1.00 packs/day for 30 years    Types: Cigarettes    Quit date: 01/25/2010  . Smokeless tobacco: Never Used  . Alcohol Use: No     Comment: former drinker  . Drug Use: No  . Sexually Active: No   Social History Narrative   Retired Metallurgist.  Lives with wife and stepson. Married x 13  years.   Current Outpatient Prescriptions on File Prior to Visit  Medication Sig Dispense Refill  . aspirin EC 81 MG tablet Take 81 mg by mouth daily.      Marland Kitchen atorvastatin (LIPITOR) 40 MG tablet Take 1 tablet (40 mg total) by mouth daily.  30 tablet  11  . fenofibrate 54 MG tablet Take 54 mg by mouth daily.      . furosemide  (LASIX) 20 MG tablet Take 1 tablet (20 mg total) by mouth daily.  30 tablet  11  . gabapentin (NEURONTIN) 300 MG capsule Take 1 capsule every morning and 2 capsules at bedtime.  90 capsule  3  . isosorbide mononitrate (IMDUR) 30 MG 24 hr tablet Take 30 mg by mouth daily.      . metoprolol succinate (TOPROL-XL) 25 MG 24 hr tablet Take 25 mg by mouth daily.      . nitroGLYCERIN (NITROSTAT) 0.4 MG SL tablet Place 1 tablet (0.4 mg total) under the tongue every 5 (five) minutes as needed for chest pain.  25 tablet  3  . nystatin ointment (MYCOSTATIN) Apply 1 application topically daily as needed (for leg.). For leg area       No current facility-administered medications on file prior to visit.   No Known Allergies  Family History  Problem Relation Age of Onset  . Alcohol abuse Father   . Cancer Sister    Objective:   Physical Exam BP 128/72  Pulse 85  Temp(Src) 98.8 F (37.1 C) (Oral)  Resp 16  Ht 5' 5.5" (1.664 m)  Wt 175 lb (79.379 kg)  BMI 28.67 kg/m2  SpO2 94% Wt Readings from Last 3 Encounters:  10/07/12 175 lb (79.379 kg)  09/27/12 169 lb 12.8 oz (77.021 kg)  09/16/12 168 lb 8 oz (76.431 kg)   Constitutional: normal  in NAD, walks with a walker, appears weak Eyes: PERRLA, EOMI, no exophthalmos ENT: moist mucous membranes, no thyromegaly, no thyroid nodules felt on palpation, no cervical lymphadenopathy Cardiovascular: RRR, No MRG, + 3 bilteral pitting edema Respiratory: CTA B Gastrointestinal: abdomen soft, NT, ND, BS+ Musculoskeletal: no deformities Skin: moist, warm, no rashes Neurological: mild tremor with outstretched hands, DTR normal and symmetric in upper extremities  Assessment:     1. Thyroid cysts - normal TSH - thyroid ultrasound (08/05/2012):  Right thyroid lobe: 6.0 x 2.0 x 1.8 cm.   Left thyroid lobe: 3.9 x 1.6 x 2.0 cm.   Isthmus: 4 mm.   Focal nodules: Several solid and cystic lesions are seen within both lobes of the thyroid gland. The largest  cystic lesion in the  right lobe is 8 x 8 x 7 mm in the interpolar region with central hyperechoic foci. There is a heterogeneous solid nodule in the  lower pole of the right lobe measuring 4 x 2 x 4 mm. Smaller lesions are present in the left lobe.  ** the largest cysts contain hyperechoic crystallized colloid with "comment tail" artifact, a sign of benignity**  Lymphadenopathy: None visualized.     Plan:     I discussed with the patient and his wife about his thyroid ultrasound results, and we reviewed the images together. I also printed him the report.  We discussed about the fact the thyroid lesions appear fluid filled, and the fact that they have inspissated colloid in them is a good prognostic sign for a benign, rather than malignant lesion. Also, the nodules are very small, less than 1 cm, well delimited from  the surrounding thyroid tissue and without internal blood flow. These are all good signs, pointing away from the diagnosis of thyroid cancer. He also does not have a family history of thyroid cancer or history of radiation therapy to the head and neck area. Due to all of the above, I think he is at very low risk for cancer, and would probably not re-image the thyroid for at least 2 years from now to monitor for growth of his cysts. However, if he is to develop neck compression symptoms, he would need to have a new ultrasound sooner. - I will not schedule another appointment for him now, but I will be glad to see him back if his thyroid lesions increase in size. I advised him to let me know if he becomes symptomatic from this. Otherwise, continue to follow with his PCP, to whom I will forward this note.

## 2012-10-07 NOTE — Patient Instructions (Signed)
Your thyroid contains small cysts filled with fluid, and these are not dangerous unless they grow and they press on the structures in your neck. Please follow up with Dr. Audria Nine, you can have a repeat thyroid ultrasound in 2 years or if you have problems swallowing, shortness of breath with lying down, you feel lumps or bumps in your neck or you feel that you are choked. Please return to see me as needed.

## 2012-10-12 ENCOUNTER — Encounter: Payer: Self-pay | Admitting: Vascular Surgery

## 2012-10-13 ENCOUNTER — Encounter: Payer: Medicare Other | Admitting: Vascular Surgery

## 2012-10-17 ENCOUNTER — Ambulatory Visit: Payer: Medicare Other | Admitting: Cardiology

## 2012-10-25 ENCOUNTER — Ambulatory Visit: Payer: Medicare Other | Admitting: Cardiology

## 2012-10-26 ENCOUNTER — Other Ambulatory Visit: Payer: Self-pay | Admitting: Cardiology

## 2012-10-27 ENCOUNTER — Encounter: Payer: Self-pay | Admitting: Vascular Surgery

## 2012-10-31 ENCOUNTER — Encounter: Payer: Self-pay | Admitting: Vascular Surgery

## 2012-10-31 ENCOUNTER — Ambulatory Visit (INDEPENDENT_AMBULATORY_CARE_PROVIDER_SITE_OTHER): Payer: Medicare Other | Admitting: Vascular Surgery

## 2012-10-31 VITALS — BP 143/91 | HR 73 | Resp 18 | Ht 67.0 in | Wt 175.0 lb

## 2012-10-31 DIAGNOSIS — M79605 Pain in left leg: Secondary | ICD-10-CM

## 2012-10-31 DIAGNOSIS — M79609 Pain in unspecified limb: Secondary | ICD-10-CM

## 2012-10-31 NOTE — Progress Notes (Signed)
Subjective:     Patient ID: Chris Payne, male   DOB: Aug 01, 1950, 62 y.o.   MRN: [id number]  HPI this 62 year old male was referred for evaluation of bilateral swelling and pain in the lower extremities. He had previous vein treatments and another vein center of 4-5 years ago-likely laser ablation of both great saphenous veins. 4 months ago he developed swelling in both legs he states. He has no history of DVT or thrombophlebitis. He is not wear elastic compression stockings. He is able to ambulate about one block without stopping.  Past Medical History  Diagnosis Date  . Hypertension   . Stroke     h/o pontine CVA  . AAA (abdominal aortic aneurysm)     s/p repair 6/11  . Hyperlipidemia   . CAD (coronary artery disease)     s/p CABG 2/12:  LIMA to LAD, SVG to diagonal-1, SVG to ramus intermedius,, SVG to AM (Dr. Dorris Fetch) ;  b.  Myoview 10/13:  inf infarct with very mild peri-infarct ischemia, EF 49%, inf HK; c. Cath 2/25 showed 2/4 occluded grafts & high grade stenosis in the LAD distal to the LIMA insertion, not amenable to PCI - Med Rx  . Carotid stenosis     a.  dopplers 8/12: LICA 50-69%;  b. Carotid dopplers 3/14:  R 0-39%, L 60-79%, repeat 6 mos  . PAD (peripheral artery disease)     ABIs 4/12:  R 0.88, L 0.92; R SFA 40%, L CFA 50%, L SFA 50-60%  . COPD (chronic obstructive pulmonary disease)   . Inguinal hernia   . Thyroid nodule     incidental finding on carotid doppler 3/14 => thyroid U/S ordered  . Myocardial infarction   . Anginal pain     occ last 1-2 months ago  . Shortness of breath   . Pneumonia     hx  . GERD (gastroesophageal reflux disease)     History  Substance Use Topics  . Smoking status: Former Smoker -- 1.00 packs/day for 30 years    Types: Cigarettes    Quit date: 01/25/2010  . Smokeless tobacco: Never Used  . Alcohol Use: No     Comment: former drinker    Family History  Problem Relation Age of Onset  . Alcohol abuse Father   . Cancer  Sister     No Known Allergies  Current outpatient prescriptions:aspirin EC 81 MG tablet, Take 81 mg by mouth daily., Disp: , Rfl: ;  atorvastatin (LIPITOR) 40 MG tablet, Take 1 tablet (40 mg total) by mouth daily., Disp: 30 tablet, Rfl: 11;  fenofibrate 54 MG tablet, Take 54 mg by mouth daily., Disp: , Rfl: ;  furosemide (LASIX) 20 MG tablet, TAKE ONE TABLET BY MOUTH EVERY DAY, Disp: 30 tablet, Rfl: 0 gabapentin (NEURONTIN) 300 MG capsule, Take 1 capsule every morning and 2 capsules at bedtime., Disp: 90 capsule, Rfl: 3;  isosorbide mononitrate (IMDUR) 30 MG 24 hr tablet, Take 30 mg by mouth daily., Disp: , Rfl: ;  metoprolol succinate (TOPROL-XL) 25 MG 24 hr tablet, Take 25 mg by mouth daily., Disp: , Rfl:  nitroGLYCERIN (NITROSTAT) 0.4 MG SL tablet, Place 1 tablet (0.4 mg total) under the tongue every 5 (five) minutes as needed for chest pain., Disp: 25 tablet, Rfl: 3;  nystatin ointment (MYCOSTATIN), Apply 1 application topically daily as needed (for leg.). For leg area, Disp: , Rfl:   BP 143/91  Pulse 73  Resp 18  Ht 5\' 7"  (1.702 m)  Wt 175 lb (79.379 kg)  BMI 27.4 kg/m2  Body mass index is 27.4 kg/(m^2).         Review of Systems complains of occasional chest pain, chest pressure, pain with walking in legs, pain in feet while lying down, swelling in legs, previous stroke with left-sided weakness, chronic back pain. All other systems negative and complete review of systems     Objective:   Physical Exam blood pressure 143/91 heart rate 73 respirations Gen.-alert and oriented x3 in no apparent distress HEENT normal for age Lungs no rhonchi or wheezing Cardiovascular regular rhythm no murmurs carotid pulses 3+ palpable no bruits audible Abdomen soft nontender no palpable masses Musculoskeletal left leg with flexion contracture at hip and knee Skin clear -no rashes except in the bilateral inguinal crease Neurologic normal Lower extremities 3+ femoral and dorsalis pedis pulses  palpable bilaterally with 1+ edema  Today I reviewed previous arterial and venous studies performed on May 20 of this year. There is no evidence of DVT or other venous abnormalities noted. Arterial study reveals ABI of 0.96 on the right and 0.86 on the left with biphasic flow  Today I performed a bedside SonoSite ultrasound exam and there is no visible great saphenous veins bilaterally.      Assessment:     History of bilateral great saphenous vein ablation at another venous center-now with chronic edema-no evidence of DVT or acute venous process    Plan:     1 elevate foot of bed 2 inches #2 short light elastic compression stockings 20-30 mm gradient #3 no further recommendations or treatment necessary

## 2012-11-18 ENCOUNTER — Other Ambulatory Visit: Payer: Self-pay | Admitting: Cardiology

## 2012-11-29 ENCOUNTER — Telehealth: Payer: Self-pay

## 2012-11-29 DIAGNOSIS — G894 Chronic pain syndrome: Secondary | ICD-10-CM

## 2012-11-29 DIAGNOSIS — M5136 Other intervertebral disc degeneration, lumbar region: Secondary | ICD-10-CM

## 2012-11-29 DIAGNOSIS — M51369 Other intervertebral disc degeneration, lumbar region without mention of lumbar back pain or lower extremity pain: Secondary | ICD-10-CM

## 2012-11-29 NOTE — Telephone Encounter (Signed)
Pt is having a lot of back pain and the son is wanting dr Audria Nine to refer him to a pain clinic   Best number [phone]

## 2012-11-29 NOTE — Telephone Encounter (Signed)
Called him to advise, voice mail box not set up

## 2012-11-29 NOTE — Telephone Encounter (Signed)
Pended please advise.  

## 2012-11-29 NOTE — Telephone Encounter (Signed)
I have ordered the referral; also I made a suggestion for an alternative clinic in [place] if nothing available locally in the near future.

## 2012-12-14 NOTE — Telephone Encounter (Signed)
Pt has appt 12/12/12 and is aware.

## 2012-12-19 DIAGNOSIS — Z029 Encounter for administrative examinations, unspecified: Secondary | ICD-10-CM

## 2012-12-26 ENCOUNTER — Other Ambulatory Visit: Payer: Self-pay

## 2012-12-26 MED ORDER — GABAPENTIN 300 MG PO CAPS: ORAL_CAPSULE | ORAL | Status: DC

## 2012-12-28 ENCOUNTER — Ambulatory Visit: Payer: Medicare Other | Admitting: Cardiology

## 2013-01-20 ENCOUNTER — Ambulatory Visit: Payer: Medicare Other | Admitting: Family Medicine

## 2013-02-08 ENCOUNTER — Other Ambulatory Visit: Payer: Self-pay | Admitting: Cardiology

## 2013-02-19 ENCOUNTER — Other Ambulatory Visit: Payer: Self-pay | Admitting: Physician Assistant

## 2013-04-05 ENCOUNTER — Other Ambulatory Visit: Payer: Self-pay

## 2013-04-05 MED ORDER — NITROGLYCERIN 0.4 MG SL SUBL: SUBLINGUAL_TABLET | SUBLINGUAL | Status: DC

## 2013-06-01 ENCOUNTER — Encounter (INDEPENDENT_AMBULATORY_CARE_PROVIDER_SITE_OTHER): Payer: Self-pay

## 2013-06-01 ENCOUNTER — Ambulatory Visit (INDEPENDENT_AMBULATORY_CARE_PROVIDER_SITE_OTHER): Payer: Commercial Managed Care - HMO | Admitting: Cardiology

## 2013-06-01 ENCOUNTER — Encounter: Payer: Self-pay | Admitting: Cardiology

## 2013-06-01 VITALS — BP 130/100 | HR 68 | Ht >= 80 in | Wt 169.0 lb

## 2013-06-01 DIAGNOSIS — I77810 Thoracic aortic ectasia: Secondary | ICD-10-CM

## 2013-06-01 DIAGNOSIS — I6529 Occlusion and stenosis of unspecified carotid artery: Secondary | ICD-10-CM

## 2013-06-01 DIAGNOSIS — I119 Hypertensive heart disease without heart failure: Secondary | ICD-10-CM

## 2013-06-01 DIAGNOSIS — I2510 Atherosclerotic heart disease of native coronary artery without angina pectoris: Secondary | ICD-10-CM

## 2013-06-01 DIAGNOSIS — I200 Unstable angina: Secondary | ICD-10-CM

## 2013-06-01 DIAGNOSIS — E042 Nontoxic multinodular goiter: Secondary | ICD-10-CM

## 2013-06-01 MED ORDER — ISOSORBIDE MONONITRATE ER 60 MG PO TB24: 60.0000 mg | ORAL_TABLET | Freq: Every day | ORAL | Status: DC

## 2013-06-01 MED ORDER — NITROGLYCERIN 0.4 MG SL SUBL: SUBLINGUAL_TABLET | SUBLINGUAL | Status: DC

## 2013-06-01 NOTE — Patient Instructions (Signed)
Please stop your Fenofibrate, increase Imdur to 60 mg a day. Continue all other medications as listed.  Your physician has requested that you have a carotid duplex. This test is an ultrasound of the carotid arteries in your neck. It looks at blood flow through these arteries that supply the brain with blood. Allow one hour for this exam. There are no restrictions or special instructions.  CT Angiography (CTA), is a special type of CT scan that uses a computer to produce multi-dimensional views of major blood vessels throughout the body. In CT angiography, a contrast material is injected through an IV to help visualize the blood vessels. This is due in May 2015.  Follow up in 6 months with Dr Percival Spanish.  You will receive a letter in the mail 2 months before you are due.  Please call us when you receive this letter to schedule your follow up appointment.

## 2013-06-01 NOTE — Progress Notes (Signed)
HPI The patient presents as a new patient for followup of vascular disease and multiple medical problems. He's had an abdominal aortic aneurysm requiring surgical repair. 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   He has severe three-vessel native coronary artery disease. Catheterization last year demonstrated a LIMA to the LAD is patent but with high-grade disease distal to the graft insertion, SVG to the ramus intermediate is occluded, SVG to the PDA is occluded, SVG to the diagonal is widely patent.  He does have aortic root dilatation 44 mm by echo 45 by CT. He has increasing carotid stenosis but has not had a six-month followup as was suggested.   He is here for routine followup. He is limited by back pain and gets around with a walker. He needs back injections. With his current level of activity he occasionally get some chest discomfort. He takes about 2 sublingual nitroglycerin per month and actually is out of this. He thinks this is a stable pattern. He has some chronic dyspnea but no acute shortness of breath, PND or orthopnea. He's had no new palpitations, presyncope or syncope.   No Known Allergies  Current Outpatient Prescriptions  Medication Sig Dispense Refill  . aspirin EC 81 MG tablet Take 81 mg by mouth daily.      Marland Kitchen atorvastatin (LIPITOR) 40 MG tablet Take 1 tablet (40 mg total) by mouth daily.  30 tablet  11  . furosemide (LASIX) 20 MG tablet TAKE ONE TABLET BY MOUTH ONCE DAILY  30 tablet  5  . gabapentin (NEURONTIN) 300 MG capsule Take 1 capsule every morning and 2 capsules at bedtime.  90 capsule  2  . HYDROcodone-acetaminophen (NORCO) 10-325 MG per tablet Take 2 tablets by mouth every 8 (eight) hours as needed.      . isosorbide mononitrate (IMDUR) 30 MG 24 hr tablet Take 30 mg by mouth daily.      . metoprolol succinate (TOPROL-XL) 25 MG 24 hr tablet TAKE ONE TABLET BY MOUTH EVERY DAY  30 tablet  0  . nitroGLYCERIN (NITROSTAT) 0.4 MG SL tablet DISSOLVE  ONE TABLET UNDER THE TONGUE EVERY 5 MINUTES AS NEEDED FOR CHEST PAIN.  DO NOT EXCEED A TOTAL OF 3 DOSES IN 15 MINUTES  25 tablet  3  . nystatin ointment (MYCOSTATIN) Apply 1 application topically daily as needed (for leg.). For leg area      . fenofibrate 54 MG tablet Take 54 mg by mouth daily.       No current facility-administered medications for this visit.    Past Medical History  Diagnosis Date  . Hypertension   . Stroke     h/o pontine CVA  . AAA (abdominal aortic aneurysm)     s/p repair 6/11  . Hyperlipidemia   . CAD (coronary artery disease)     s/p CABG 2/12:  LIMA to LAD, SVG to diagonal-1, SVG to ramus intermedius,, SVG to AM (Dr. Roxan Hockey) ;  b.  Myoview 10/13:  inf infarct with very mild peri-infarct ischemia, EF 49%, inf HK; c. Cath 2/25 showed 2/4 occluded grafts & high grade stenosis in the LAD distal to the LIMA insertion, not amenable to PCI - Med Rx  . Carotid stenosis     a.  dopplers 3/76: LICA 28-31%;  b. Carotid dopplers 3/14:  R 0-39%, L 60-79%, repeat 6 mos  . PAD (peripheral artery disease)     ABIs 4/12:  R 0.88, L 0.92; R SFA 40%,  L CFA 50%, L SFA 50-60%  . COPD (chronic obstructive pulmonary disease)   . Inguinal hernia   . Thyroid nodule     incidental finding on carotid doppler 3/14 => thyroid U/S ordered  . Myocardial infarction   . Anginal pain     occ last 1-2 months ago  . Shortness of breath   . Pneumonia     hx  . GERD (gastroesophageal reflux disease)     Past Surgical History  Procedure Laterality Date  . Hip surgery  40 years ago    left hip bone removal and pinning  . Abdominal aortic aneurysm repair  10-03-2009  . Exploratory laparotomy  09/2009    ligation of lumbar arteries  . Coronary artery bypass graft      LIMA to the LAD, SVG to first diagonal, SVG to ramus intermediate, SVG to acute marginal. EF 50%. 2/12  . External ear surgery Left     laceration child  . Inguinal hernia repair Left 07/21/2012    Procedure: HERNIA  REPAIR INGUINAL ADULT;  Surgeon: Merrie Roof, MD;  Location: [facility];  Service: General;  Laterality: Left;  . Insertion of mesh Left 07/21/2012    Procedure: INSERTION OF MESH;  Surgeon: Merrie Roof, MD;  Location: [facility];  Service: General;  Laterality: Left;   ROS:  Occasional abdominal pain. Otherwise as stated in the HPI and negative for all other systems.  PHYSICAL EXAM BP 130/100  Pulse 68  Ht 8' (2.438 m)  Wt 169 lb (76.658 kg)  BMI 12.90 kg/m2  PF 57 L/min GENERAL: Looks older than stated age 63:  Pupils equal round and reactive, fundi not visualized, oral mucosa unremarkable, dentures NECK:  No jugular venous distention, waveform within normal limits, carotid upstroke brisk and symmetric, no bruits, no thyromegaly LYMPHATICS:  No cervical, inguinal adenopathy LUNGS:  Clear to auscultation bilaterally BACK:  No CVA tenderness CHEST:  Well healed sternotomy scar. HEART:  PMI not displaced or sustained,S1 and S2 within normal limits, no S3, no S4, no clicks, no rubs, no murmurs ABD:  Flat, positive bowel sounds normal in frequency in pitch, no bruits, no rebound, no guarding, no midline pulsatile mass, no hepatomegaly, no splenomegaly, abdominal scar. EXT:  2 plus pulses upper, diminished bilateral lower, moderate bilateral lower extremity edema, no cyanosis no clubbing, bilateral bruits, left leg is shorter than right  SKIN:  No rashes no nodules NEURO:  Cranial nerves II through XII grossly intact, motor grossly intact throughout [facility]:  Cognitively intact, oriented to person place and time  EKG:  Sinus rhythm, rate 68, axis within normal limits, intervals within normal limits, nonspecific anterior T-wave inversion, poor anterior R wave progression, no acute ST-T wave changes since previous ECG.  06/01/2013  ASSESSMENT AND PLAN  CAD:  The patient has severe three-vessel and wrapped disease. He needs however continue medical management. I will increase his Imdur to 60 mg  daily.   Carotid Stenosis:  He is overdue for followup and I will arrange carotid Dopplers.  Hyperlipidemia:  I  switched him to a moderate to high dose of statin per recent guidelines in the past .He was to stop his fibric acid. However, I see that he is still doing this. I will stop the fenofibrate.  Abdominal Aortic Aneurysm, s/p Repair: Followed by [facility].  Thoracic aortic enlargement I will follow up with a CT angiogram in May.

## 2013-06-12 ENCOUNTER — Encounter (HOSPITAL_COMMUNITY): Payer: Medicare HMO

## 2013-07-03 ENCOUNTER — Encounter (HOSPITAL_COMMUNITY): Payer: Self-pay | Admitting: Emergency Medicine

## 2013-07-03 ENCOUNTER — Emergency Department (HOSPITAL_COMMUNITY): Payer: Medicare HMO

## 2013-07-03 ENCOUNTER — Inpatient Hospital Stay (HOSPITAL_COMMUNITY)
Admission: EM | Admit: 2013-07-03 | Discharge: 2013-07-05 | DRG: 287 | Disposition: A | Discharge status: Home / Self Care | Payer: Medicare HMO | Attending: Internal Medicine | Admitting: Internal Medicine

## 2013-07-03 DIAGNOSIS — I252 Old myocardial infarction: Secondary | ICD-10-CM

## 2013-07-03 DIAGNOSIS — R079 Chest pain, unspecified: Secondary | ICD-10-CM

## 2013-07-03 DIAGNOSIS — J4489 Other specified chronic obstructive pulmonary disease: Secondary | ICD-10-CM | POA: Diagnosis present

## 2013-07-03 DIAGNOSIS — I2510 Atherosclerotic heart disease of native coronary artery without angina pectoris: Secondary | ICD-10-CM | POA: Diagnosis present

## 2013-07-03 DIAGNOSIS — I25810 Atherosclerosis of coronary artery bypass graft(s) without angina pectoris: Principal | ICD-10-CM | POA: Diagnosis present

## 2013-07-03 DIAGNOSIS — K219 Gastro-esophageal reflux disease without esophagitis: Secondary | ICD-10-CM | POA: Diagnosis present

## 2013-07-03 DIAGNOSIS — I200 Unstable angina: Secondary | ICD-10-CM | POA: Diagnosis present

## 2013-07-03 DIAGNOSIS — I739 Peripheral vascular disease, unspecified: Secondary | ICD-10-CM | POA: Diagnosis present

## 2013-07-03 DIAGNOSIS — I6529 Occlusion and stenosis of unspecified carotid artery: Secondary | ICD-10-CM | POA: Diagnosis present

## 2013-07-03 DIAGNOSIS — J449 Chronic obstructive pulmonary disease, unspecified: Secondary | ICD-10-CM | POA: Diagnosis present

## 2013-07-03 DIAGNOSIS — Z87891 Personal history of nicotine dependence: Secondary | ICD-10-CM

## 2013-07-03 DIAGNOSIS — I119 Hypertensive heart disease without heart failure: Secondary | ICD-10-CM | POA: Diagnosis present

## 2013-07-03 DIAGNOSIS — E785 Hyperlipidemia, unspecified: Secondary | ICD-10-CM | POA: Diagnosis present

## 2013-07-03 DIAGNOSIS — R29898 Other symptoms and signs involving the musculoskeletal system: Secondary | ICD-10-CM | POA: Diagnosis present

## 2013-07-03 DIAGNOSIS — I69998 Other sequelae following unspecified cerebrovascular disease: Secondary | ICD-10-CM

## 2013-07-03 DIAGNOSIS — I209 Angina pectoris, unspecified: Secondary | ICD-10-CM | POA: Diagnosis present

## 2013-07-03 DIAGNOSIS — I2582 Chronic total occlusion of coronary artery: Secondary | ICD-10-CM | POA: Diagnosis present

## 2013-07-03 LAB — CBC
HEMATOCRIT: 50.5 % (ref 39.0–52.0)
Hemoglobin: 17.6 g/dL — ABNORMAL HIGH (ref 13.0–17.0)
MCH: 34.1 pg — ABNORMAL HIGH (ref 26.0–34.0)
MCHC: 34.9 g/dL (ref 30.0–36.0)
MCV: 97.9 fL (ref 78.0–100.0)
Platelets: 184 10*3/uL (ref 150–400)
RBC: 5.16 MIL/uL (ref 4.22–5.81)
RDW: 12.1 % (ref 11.5–15.5)
WBC: 7.7 10*3/uL (ref 4.0–10.5)

## 2013-07-03 LAB — I-STAT TROPONIN, ED
TROPONIN I, POC: 0 ng/mL (ref 0.00–0.08)
Troponin i, poc: 0 ng/mL (ref 0.00–0.08)

## 2013-07-03 LAB — COMPREHENSIVE METABOLIC PANEL
ALBUMIN: 4 g/dL (ref 3.5–5.2)
ALK PHOS: 85 U/L (ref 39–117)
ALT: 14 U/L (ref 0–53)
AST: 18 U/L (ref 0–37)
BUN: 11 mg/dL (ref 6–23)
CO2: 25 mEq/L (ref 19–32)
Calcium: 9.3 mg/dL (ref 8.4–10.5)
Chloride: 100 mEq/L (ref 96–112)
Creatinine, Ser: 0.71 mg/dL (ref 0.50–1.35)
GFR calc Af Amer: >90 mL/min (ref >90)
GFR calc non Af Amer: >90 mL/min (ref >90)
Glucose, Bld: 93 mg/dL (ref 70–99)
POTASSIUM: 4.1 meq/L (ref 3.7–5.3)
Sodium: 139 mEq/L (ref 137–147)
TOTAL PROTEIN: 7.7 g/dL (ref 6.0–8.3)
Total Bilirubin: 0.5 mg/dL (ref 0.3–1.2)

## 2013-07-03 LAB — APTT: APTT: 35 s (ref 24–37)

## 2013-07-03 LAB — PROTIME-INR
INR: 1.07 (ref 0.00–1.49)
Prothrombin Time: 13.7 seconds (ref 11.6–15.2)

## 2013-07-03 LAB — TROPONIN I

## 2013-07-03 MED ORDER — SODIUM CHLORIDE 0.9 % IJ SOLN: 3.0000 mL | INTRAMUSCULAR | Status: DC | PRN

## 2013-07-03 MED ORDER — GABAPENTIN 300 MG PO CAPS
300.0000 mg | ORAL_CAPSULE | Freq: Every morning | ORAL | Status: DC
  Administered 2013-07-04 – 2013-07-05 (×2): 300 mg via ORAL
  Filled 2013-07-03 (×2): qty 1

## 2013-07-03 MED ORDER — ALUM & MAG HYDROXIDE-SIMETH 200-200-20 MG/5ML PO SUSP
30.0000 mL | ORAL | Status: DC | PRN
  Administered 2013-07-03: 30 mL via ORAL
  Filled 2013-07-03: qty 30

## 2013-07-03 MED ORDER — SODIUM CHLORIDE 0.9 % IV SOLN
1.0000 mL/kg/h | INTRAVENOUS | Status: DC
Start: 2013-07-04 — End: 2013-07-04

## 2013-07-03 MED ORDER — ASPIRIN 81 MG PO CHEW
324.0000 mg | CHEWABLE_TABLET | Freq: Once | ORAL | Status: AC
  Administered 2013-07-03: 162 mg via ORAL
  Filled 2013-07-03: qty 4

## 2013-07-03 MED ORDER — ASPIRIN 81 MG PO CHEW
324.0000 mg | CHEWABLE_TABLET | ORAL | Status: AC
Start: 2013-07-03 — End: 2013-07-04
  Administered 2013-07-04: 324 mg via ORAL
  Filled 2013-07-03: qty 4

## 2013-07-03 MED ORDER — HEPARIN BOLUS VIA INFUSION
4000.0000 [IU] | Freq: Once | INTRAVENOUS | Status: AC
  Administered 2013-07-03: 4000 [IU] via INTRAVENOUS
  Filled 2013-07-03: qty 4000

## 2013-07-03 MED ORDER — ASPIRIN EC 81 MG PO TBEC
81.0000 mg | DELAYED_RELEASE_TABLET | Freq: Every day | ORAL | Status: DC
  Administered 2013-07-04 – 2013-07-05 (×2): 81 mg via ORAL
  Filled 2013-07-03 (×2): qty 1

## 2013-07-03 MED ORDER — HYDROCODONE-ACETAMINOPHEN 10-325 MG PO TABS: 2.0000 | ORAL_TABLET | Freq: Two times a day (BID) | ORAL | Status: DC | PRN

## 2013-07-03 MED ORDER — NITROGLYCERIN IN D5W 200-5 MCG/ML-% IV SOLN
3.0000 ug/min | INTRAVENOUS | Status: DC
  Administered 2013-07-03: 20 ug/min via INTRAVENOUS
  Filled 2013-07-03: qty 250

## 2013-07-03 MED ORDER — METOPROLOL TARTRATE 12.5 MG HALF TABLET
12.5000 mg | ORAL_TABLET | Freq: Two times a day (BID) | ORAL | Status: DC
Start: 2013-07-03 — End: 2013-07-05
  Administered 2013-07-03 – 2013-07-05 (×4): 12.5 mg via ORAL
  Filled 2013-07-03 (×5): qty 1

## 2013-07-03 MED ORDER — ASPIRIN 300 MG RE SUPP
300.0000 mg | RECTAL | Status: AC
  Filled 2013-07-03: qty 1

## 2013-07-03 MED ORDER — HEPARIN (PORCINE) IN NACL 100-0.45 UNIT/ML-% IJ SOLN
1000.0000 [IU]/h | INTRAMUSCULAR | Status: DC
  Administered 2013-07-03: 900 [IU]/h via INTRAVENOUS
  Filled 2013-07-03 (×2): qty 250

## 2013-07-03 MED ORDER — SODIUM CHLORIDE 0.9 % IV SOLN: 250.0000 mL | INTRAVENOUS | Status: DC | PRN

## 2013-07-03 MED ORDER — ASPIRIN EC 81 MG PO TBEC: 81.0000 mg | DELAYED_RELEASE_TABLET | Freq: Every day | ORAL | Status: DC

## 2013-07-03 MED ORDER — NITROGLYCERIN 2 % TD OINT
1.0000 [in_us] | TOPICAL_OINTMENT | Freq: Once | TRANSDERMAL | Status: AC
  Administered 2013-07-03: 1 [in_us] via TOPICAL
  Filled 2013-07-03: qty 1

## 2013-07-03 MED ORDER — SODIUM CHLORIDE 0.9 % IV SOLN
1000.0000 mL | INTRAVENOUS | Status: DC
  Administered 2013-07-03: 1000 mL via INTRAVENOUS

## 2013-07-03 MED ORDER — SODIUM CHLORIDE 0.9 % IJ SOLN: 3.0000 mL | Freq: Two times a day (BID) | INTRAMUSCULAR | Status: DC

## 2013-07-03 MED ORDER — SODIUM CHLORIDE 0.9 % IJ SOLN
3.0000 mL | Freq: Two times a day (BID) | INTRAMUSCULAR | Status: DC
  Administered 2013-07-03: 3 mL via INTRAVENOUS

## 2013-07-03 MED ORDER — ATORVASTATIN CALCIUM 40 MG PO TABS
40.0000 mg | ORAL_TABLET | Freq: Every day | ORAL | Status: DC
  Administered 2013-07-03 – 2013-07-04 (×2): 40 mg via ORAL
  Filled 2013-07-03 (×2): qty 1

## 2013-07-03 MED ORDER — GABAPENTIN 300 MG PO CAPS
600.0000 mg | ORAL_CAPSULE | Freq: Every day | ORAL | Status: DC
  Administered 2013-07-03 – 2013-07-04 (×2): 600 mg via ORAL
  Filled 2013-07-03 (×3): qty 2

## 2013-07-03 NOTE — Progress Notes (Signed)
ANTICOAGULATION CONSULT NOTE - Initial Consult  Pharmacy Consult for heparin Indication: chest pain/ACS  No Known Allergies  Patient Measurements: Height: 5\' 7"  (170.2 cm) Weight: 170 lb (77.111 kg) IBW/kg (Calculated) : 66.1 Heparin Dosing Weight: 77 kg  Vital Signs: Temp: 98.7 F (37.1 C) (03/09 1236) Temp src: Oral (03/09 1236) BP: 157/95 mmHg (03/09 1900) Pulse Rate: 79 (03/09 1900)  Labs:  Recent Labs  07/03/13 1126  HGB 17.6*  HCT 50.5  PLT 184  APTT 35  LABPROT 13.7  INR 1.07  CREATININE 0.71    Estimated Creatinine Clearance: 87.2 ml/min (by C-G formula based on Cr of 0.71).   Medical History: Past Medical History  Diagnosis Date  . Hypertension   . Stroke     h/o pontine CVA  . AAA (abdominal aortic aneurysm)     s/p repair 6/11  . Hyperlipidemia   . CAD (coronary artery disease)     s/p CABG 2/12:  LIMA to LAD, SVG to diagonal-1, SVG to ramus intermedius,, SVG to AM (Dr. Roxan Hockey) ;  b.  Myoview 10/13:  inf infarct with very mild peri-infarct ischemia, EF 49%, inf HK; c. Cath 2/25 showed 2/4 occluded grafts & high grade stenosis in the LAD distal to the LIMA insertion, not amenable to PCI - Med Rx  . Carotid stenosis     a.  dopplers 2/26: LICA 33-35%;  b. Carotid dopplers 3/14:  R 0-39%, L 60-79%, repeat 6 mos  . PAD (peripheral artery disease)     ABIs 4/12:  R 0.88, L 0.92; R SFA 40%, L CFA 50%, L SFA 50-60%  . COPD (chronic obstructive pulmonary disease)   . Inguinal hernia   . Thyroid nodule     incidental finding on carotid doppler 3/14 => thyroid U/S ordered  . Myocardial infarction   . Anginal pain     occ last 1-2 months ago  . Shortness of breath   . Pneumonia     hx  . GERD (gastroesophageal reflux disease)     Medications:  See med rec  Assessment: Patient is a 63 y.o M with hx CAD.  He presented to the ED today with c/o CP.  To start heparin for r/o ACS with plan for cardiac cath procedure on 3/10.  Goal of Therapy:   Heparin level 0.3-0.7 units/ml Monitor platelets by anticoagulation protocol: Yes   Plan:  1) Heparin 4000 units IV x1 bolus, then drip at 900 units/hr 2) check 6 hour heparin level  Delford Wingert P 07/03/2013,7:38 PM

## 2013-07-03 NOTE — ED Notes (Signed)
Pt called out stating he was having severe CP again and that his body felt as though it was on fire. 2nd EKG obtained and showed to EDP and EDP aware of episode. Lab at bedside.

## 2013-07-03 NOTE — ED Notes (Signed)
Family member Grayland Ormond left phone number [phone] if needed for anything.

## 2013-07-03 NOTE — H&P (Signed)
ADMISSION HISTORY & PHYSICAL   Chief Complaint:  Chest pain  Cardiologist: Hochrein  Primary Care Physician: Ellsworth Lennox, MD  HPI:  This is a 63 y.o. male with a past medical history significant for abdominal aortic aneurysm requiring surgical repair. 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 He has severe three-vessel native coronary artery disease. Catheterization last year demonstrated a LIMA to the LAD is patent but with high-grade disease distal to the graft insertion, SVG to the ramus intermediate is occluded, SVG to the PDA is occluded, SVG to the diagonal is widely patent. He does have aortic root dilatation 44 mm by echo 45 by CT. He was last seen by Dr. Percival Spanish about 1 month ago and was reporting what was thought to be stable angina. He said he took nitroglycerin about twice a month but had ran out of it. His imdur was increased to 60 mg daily last month and then he reports 2 weeks after that he started having worsening chest pain that crescendoed to the point where he came in today. He took 2 additional SL nitro this morning with relief of his symptoms.  PMHx:  Past Medical History  Diagnosis Date  . Hypertension   . Stroke     h/o pontine CVA  . AAA (abdominal aortic aneurysm)     s/p repair 6/11  . Hyperlipidemia   . CAD (coronary artery disease)     s/p CABG 2/12:  LIMA to LAD, SVG to diagonal-1, SVG to ramus intermedius,, SVG to AM (Dr. Roxan Hockey) ;  b.  Myoview 10/13:  inf infarct with very mild peri-infarct ischemia, EF 49%, inf HK; c. Cath 2/25 showed 2/4 occluded grafts & high grade stenosis in the LAD distal to the LIMA insertion, not amenable to PCI - Med Rx  . Carotid stenosis     a.  dopplers 1/28: LICA 78-67%;  b. Carotid dopplers 3/14:  R 0-39%, L 60-79%, repeat 6 mos  . PAD (peripheral artery disease)     ABIs 4/12:  R 0.88, L 0.92; R SFA 40%, L CFA 50%, L SFA 50-60%  . COPD (chronic obstructive pulmonary disease)   .  Inguinal hernia   . Thyroid nodule     incidental finding on carotid doppler 3/14 => thyroid U/S ordered  . Myocardial infarction   . Anginal pain     occ last 1-2 months ago  . Shortness of breath   . Pneumonia     hx  . GERD (gastroesophageal reflux disease)     Past Surgical History  Procedure Laterality Date  . Hip surgery  40 years ago    left hip bone removal and pinning  . Abdominal aortic aneurysm repair  10-03-2009  . Exploratory laparotomy  09/2009    ligation of lumbar arteries  . Coronary artery bypass graft      LIMA to the LAD, SVG to first diagonal, SVG to ramus intermediate, SVG to acute marginal. EF 50%. 2/12  . External ear surgery Left     laceration child  . Inguinal hernia repair Left 07/21/2012    Procedure: HERNIA REPAIR INGUINAL ADULT;  Surgeon: Merrie Roof, MD;  Location: [place];  Service: General;  Laterality: Left;  . Insertion of mesh Left 07/21/2012    Procedure: INSERTION OF MESH;  Surgeon: Merrie Roof, MD;  Location: [facility];  Service: General;  Laterality: Left;    FAMHx:  Family History  Problem Relation Age  of Onset  . Alcohol abuse Father   . Cancer Sister     SOCHx:   reports that he quit smoking about 3 years ago. His smoking use included Cigarettes. He has a 30 pack-year smoking history. He has never used smokeless tobacco. He reports that he does not drink alcohol or use illicit drugs.  ALLERGIES:  No Known Allergies  ROS: A comprehensive review of systems was negative except for: Respiratory: positive for chronic bronchitis and dyspnea on exertion Cardiovascular: positive for chest pain  HOME MEDS:  (Not in a hospital admission)  LABS/IMAGING: Results for orders placed during the hospital encounter of 07/03/13 (from the past 48 hour(s))  APTT     Status: None   Collection Time    07/03/13 11:26 AM      Result Value Ref Range   aPTT 35  24 - 37 seconds  CBC     Status: Abnormal   Collection Time    07/03/13 11:26 AM       Result Value Ref Range   WBC 7.7  4.0 - 10.5 K/uL   RBC 5.16  4.22 - 5.81 MIL/uL   Hemoglobin 17.6 (*) 13.0 - 17.0 g/dL   HCT 50.5  39.0 - 52.0 %   MCV 97.9  78.0 - 100.0 fL   MCH 34.1 (*) 26.0 - 34.0 pg   MCHC 34.9  30.0 - 36.0 g/dL   RDW 12.1  11.5 - 15.5 %   Platelets 184  150 - 400 K/uL  COMPREHENSIVE METABOLIC PANEL     Status: None   Collection Time    07/03/13 11:26 AM      Result Value Ref Range   Sodium 139  137 - 147 mEq/L   Potassium 4.1  3.7 - 5.3 mEq/L   Chloride 100  96 - 112 mEq/L   CO2 25  19 - 32 mEq/L   Glucose, Bld 93  70 - 99 mg/dL   BUN 11  6 - 23 mg/dL   Creatinine, Ser 0.71  0.50 - 1.35 mg/dL   Calcium 9.3  8.4 - 10.5 mg/dL   Total Protein 7.7  6.0 - 8.3 g/dL   Albumin 4.0  3.5 - 5.2 g/dL   AST 18  0 - 37 U/L   ALT 14  0 - 53 U/L   Alkaline Phosphatase 85  39 - 117 U/L   Total Bilirubin 0.5  0.3 - 1.2 mg/dL   GFR calc non Af Amer >90  >90 mL/min   GFR calc Af Amer >90  >90 mL/min   Comment: (NOTE)     The eGFR has been calculated using the CKD EPI equation.     This calculation has not been validated in all clinical situations.     eGFR's persistently <90 mL/min signify possible Chronic Kidney     Disease.  PROTIME-INR     Status: None   Collection Time    07/03/13 11:26 AM      Result Value Ref Range   Prothrombin Time 13.7  11.6 - 15.2 seconds   INR 1.07  0.00 - 1.49  I-STAT TROPOININ, ED     Status: None   Collection Time    07/03/13  1:28 PM      Result Value Ref Range   Troponin i, poc 0.00  0.00 - 0.08 ng/mL   Comment 3            Comment: Due to the release kinetics of cTnI,  a negative result within the first hours     of the onset of symptoms does not rule out     myocardial infarction with certainty.     If myocardial infarction is still suspected,     repeat the test at appropriate intervals.  Randolm Idol, ED     Status: None   Collection Time    07/03/13  4:48 PM      Result Value Ref Range   Troponin i, poc 0.00   0.00 - 0.08 ng/mL   Comment 3            Comment: Due to the release kinetics of cTnI,     a negative result within the first hours     of the onset of symptoms does not rule out     myocardial infarction with certainty.     If myocardial infarction is still suspected,     repeat the test at appropriate intervals.   Dg Chest 2 View  07/03/2013   CLINICAL DATA:  Chest pain/tightness  EXAM: CHEST  2 VIEW  COMPARISON:  08/2012  FINDINGS: Stable lung base scarring. Lungs are hyperexpanded but otherwise clear.  Cardiac silhouette is normal in size. 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 Normal mediastinal and hilar contours.  Bony thorax is demineralized but intact.  IMPRESSION: No acute cardiopulmonary disease. COPD and stable lung base scarring.   Electronically Signed   By: Lajean Manes M.D.   On: 07/03/2013 12:16    VITALS: Filed Vitals:   07/03/13 1752  BP: 166/86  Pulse: 74  Temp:   Resp: 17    EXAM: General appearance: alert and no distress Neck: no carotid bruit and no JVD Lungs: clear to auscultation bilaterally Heart: regular rate and rhythm, S1, S2 normal, no murmur, click, rub or gallop Abdomen: soft, non-tender; bowel sounds normal; no masses,  no organomegaly Extremities: edema trace sockline edema Pulses: 2+ and symmetric Skin: Skin color, texture, turgor normal. No rashes or lesions Neurologic: Grossly normal Psych: Fairly relaxed  IMPRESSION: Principal Problem:   Unstable angina Active Problems:   Hypertensive heart disease   Hyperlipidemia   CAD (coronary artery disease)   PAD (peripheral artery disease)   PLAN: 1. Mr. Bolin is describing unstable angina with increasing nitroglycerin component. He has known multivessel CAD with 2 occluded grafts at his last cath in 2012.  He had high-grade disease distal to the insertion of the LIMA graft and the graft to the LCX was patent at that time. He was managed medically, but it seems he may be failing that at this  point. 2. I would recommend admission.  Start heparin and nitroglycerin. Keep NPO p MN for LHC tomorrow.  Thanks for consulting [place].  Pixie Casino, MD, [facility] Attending Cardiologist [facility]  Shlome Baldree C 07/03/2013, 6:10 PM

## 2013-07-03 NOTE — ED Notes (Signed)
Patient transported by [facility] for complaint of Chest pain, cough and fever starting last night at approximately 2130.  Patient given 162 mg Aspirin by Fire Department on scene.

## 2013-07-03 NOTE — ED Provider Notes (Signed)
CSN: [id number]     Arrival date & time 07/03/13  1051 History   First MD Initiated Contact with Patient 07/03/13 1055     Chief Complaint  Patient presents with  . Chest Pain  . Cough   Patient is a 63 y.o. male presenting with chest pain and cough. The history is provided by the patient.  Chest Pain Pain location:  L chest Pain quality: burning and pressure   Pain radiates to:  L arm (also right and left leg) Duration:  2 minutes Timing:  Intermittent Relieved by:  Nothing Worsened by:  Nothing tried Associated symptoms: cough (spitting up yellow sputum) and shortness of breath   Associated symptoms: no fever, no nausea and not vomiting   Cough Associated symptoms: chest pain and shortness of breath   Associated symptoms: no fever   Pt has had a pesistent cough for a couple of years.  He also feels like he has wheezing pretty regularly.  All this occurred after quitting smoking.   Pt has history of CAD.  He does not think this feels similar but his angina did feel like a pressure on his chest like his current symptoms.  Pt has run out of some of his heart medications.  Past Medical History  Diagnosis Date  . Hypertension   . Stroke     h/o pontine CVA  . AAA (abdominal aortic aneurysm)     s/p repair 6/11  . Hyperlipidemia   . CAD (coronary artery disease)     s/p CABG 2/12:  LIMA to LAD, SVG to diagonal-1, SVG to ramus intermedius,, SVG to AM (Dr. Dorris Fetch) ;  b.  Myoview 10/13:  inf infarct with very mild peri-infarct ischemia, EF 49%, inf HK; c. Cath 2/25 showed 2/4 occluded grafts & high grade stenosis in the LAD distal to the LIMA insertion, not amenable to PCI - Med Rx  . Carotid stenosis     a.  dopplers 8/12: LICA 50-69%;  b. Carotid dopplers 3/14:  R 0-39%, L 60-79%, repeat 6 mos  . PAD (peripheral artery disease)     ABIs 4/12:  R 0.88, L 0.92; R SFA 40%, L CFA 50%, L SFA 50-60%  . COPD (chronic obstructive pulmonary disease)   . Inguinal hernia   . Thyroid nodule      incidental finding on carotid doppler 3/14 => thyroid U/S ordered  . Myocardial infarction   . Anginal pain     occ last 1-2 months ago  . Shortness of breath   . Pneumonia     hx  . GERD (gastroesophageal reflux disease)    Past Surgical History  Procedure Laterality Date  . Hip surgery  40 years ago    left hip bone removal and pinning  . Abdominal aortic aneurysm repair  10-03-2009  . Exploratory laparotomy  09/2009    ligation of lumbar arteries  . Coronary artery bypass graft      LIMA to the LAD, SVG to first diagonal, SVG to ramus intermediate, SVG to acute marginal. EF 50%. 2/12  . External ear surgery Left     laceration child  . Inguinal hernia repair Left 07/21/2012    Procedure: HERNIA REPAIR INGUINAL ADULT;  Surgeon: Robyne Askew, MD;  Location: [facility] OR;  Service: General;  Laterality: Left;  . Insertion of mesh Left 07/21/2012    Procedure: INSERTION OF MESH;  Surgeon: Robyne Askew, MD;  [facility];  Service: General;  Laterality:  Left;   Family History  Problem Relation Age of Onset  . Alcohol abuse Father   . Cancer Sister    History  Substance Use Topics  . Smoking status: Former Smoker -- 1.00 packs/day for 30 years    Types: Cigarettes    Quit date: 01/25/2010  . Smokeless tobacco: Never Used  . Alcohol Use: No     Comment: former drinker    Review of Systems  Constitutional: Negative for fever.  Respiratory: Positive for cough (spitting up yellow sputum) and shortness of breath.   Cardiovascular: Positive for chest pain.  Gastrointestinal: Negative for nausea and vomiting.  All other systems reviewed and are negative.      Allergies  Review of patient's allergies indicates no known allergies.  Home Medications   Current Outpatient Rx  Name  Route  Sig  Dispense  Refill  . aspirin EC 81 MG tablet   Oral   Take 81 mg by mouth daily.         Marland Kitchen gabapentin (NEURONTIN) 300 MG capsule      Take 1 capsule every morning and 2  capsules at bedtime.   90 capsule   2   . HYDROcodone-acetaminophen (NORCO) 10-325 MG per tablet   Oral   Take 2 tablets by mouth every 12 (twelve) hours as needed for moderate pain.          . isosorbide mononitrate (IMDUR) 60 MG 24 hr tablet   Oral   Take 60 mg by mouth daily.         . nitroGLYCERIN (NITROSTAT) 0.4 MG SL tablet   Sublingual   Place 0.4 mg under the tongue every 5 (five) minutes as needed for chest pain.          Marland Kitchen nystatin ointment (MYCOSTATIN)   Topical   Apply 1 application topically daily as needed (for leg.). For leg area         . Pseudoephedrine-Ibuprofen (ADVIL COLD/SINUS PO)   Oral   Take 1 tablet by mouth daily as needed (cold).          . EXPIRED: atorvastatin (LIPITOR) 40 MG tablet   Oral   Take 1 tablet (40 mg total) by mouth daily.   30 tablet   11    BP 163/80  Pulse 60  Temp(Src) 98.7 F (37.1 C) (Oral)  Resp 16  Ht 5\' 7"  (1.702 m)  Wt 170 lb (77.111 kg)  BMI 26.62 kg/m2  SpO2 95% Physical Exam  Nursing note and vitals reviewed. Constitutional: He appears well-developed and well-nourished. No distress.  HENT:  Head: Normocephalic and atraumatic.  Right Ear: External ear normal.  Left Ear: External ear normal.  Eyes: Conjunctivae are normal. Right eye exhibits no discharge. Left eye exhibits no discharge. No scleral icterus.  Neck: Neck supple. No tracheal deviation present.  Cardiovascular: Normal rate, regular rhythm and intact distal pulses.   Pulmonary/Chest: Effort normal and breath sounds normal. No stridor. No respiratory distress. He has no wheezes. He has no rales.  Abdominal: Soft. Bowel sounds are normal. He exhibits no distension. There is no tenderness. There is no rebound and no guarding.  Musculoskeletal: He exhibits no edema and no tenderness.  Neurological: He is alert. He has normal strength. No cranial nerve deficit (no facial droop, extraocular movements intact, no slurred speech) or sensory deficit.  He exhibits normal muscle tone. He displays no seizure activity. Coordination normal.  Skin: Skin is warm and dry. No rash noted.  Psychiatric: He has a normal mood and affect.    ED Course  Procedures (including critical care time) Labs Review Labs Reviewed  CBC - Abnormal; Notable for the following:    Hemoglobin 17.6 (*)    MCH 34.1 (*)    All other components within normal limits  APTT  COMPREHENSIVE METABOLIC PANEL  PROTIME-INR  I-STAT TROPOININ, ED  Randolm Idol, ED   Imaging Review Dg Chest 2 View  07/03/2013   CLINICAL DATA:  Chest pain/tightness  EXAM: CHEST  2 VIEW  COMPARISON:  08/2012  FINDINGS: Stable lung base scarring. Lungs are hyperexpanded but otherwise clear.  Cardiac silhouette is normal in size. 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 Normal mediastinal and hilar contours.  Bony thorax is demineralized but intact.  IMPRESSION: No acute cardiopulmonary disease. COPD and stable lung base scarring.   Electronically Signed   By: Lajean Manes M.D.   On: 07/03/2013 12:16     EKG Interpretation   Date/Time:  Monday July 03 2013 13:07:51 EDT Ventricular Rate:  82 PR Interval:  169 QRS Duration: 99 QT Interval:  378 QTC Calculation: 441 R Axis:   -9 Text Interpretation:  Sinus rhythm Ventricular premature complex  Borderline T abnormalities, anterior leads No significant change since  last tracing Confirmed by Brooklyn Jeff  MD-J, Anay Walter ([id number]) on 07/03/2013 1:09:20 PM      MDM   Final diagnoses:  Chest pain  CAD (coronary artery disease)   Pt presents with chest pain.  Pt is unable to say if this pain is similar to his prior angina but he described the pain is severe and concerning enough to come to the ED.  Known history of CAD.  Has not been taking his medication when they ran out.  Will consult with cardiology regarding possible admission for further evaluation.   Kathalene Frames, MD 07/03/13 [phone]

## 2013-07-04 ENCOUNTER — Encounter (HOSPITAL_COMMUNITY)
Admission: EM | Disposition: A | Discharge status: Home / Self Care | Payer: Self-pay | Source: Home / Self Care | Attending: Internal Medicine

## 2013-07-04 DIAGNOSIS — E785 Hyperlipidemia, unspecified: Secondary | ICD-10-CM

## 2013-07-04 DIAGNOSIS — I2510 Atherosclerotic heart disease of native coronary artery without angina pectoris: Secondary | ICD-10-CM

## 2013-07-04 DIAGNOSIS — I209 Angina pectoris, unspecified: Secondary | ICD-10-CM | POA: Diagnosis present

## 2013-07-04 HISTORY — PX: LEFT HEART CATHETERIZATION WITH CORONARY/GRAFT ANGIOGRAM: SHX5450

## 2013-07-04 LAB — LIPID PANEL
CHOL/HDL RATIO: 4.4 ratio
Cholesterol: 188 mg/dL (ref 0–200)
HDL: 43 mg/dL (ref >39)
LDL Cholesterol: 129 mg/dL — ABNORMAL HIGH (ref 0–99)
TRIGLYCERIDES: 81 mg/dL (ref <150)
VLDL: 16 mg/dL (ref 0–40)

## 2013-07-04 LAB — CBC
HCT: 46.7 % (ref 39.0–52.0)
Hemoglobin: 16 g/dL (ref 13.0–17.0)
MCH: 33.5 pg (ref 26.0–34.0)
MCHC: 34.3 g/dL (ref 30.0–36.0)
MCV: 97.9 fL (ref 78.0–100.0)
Platelets: 189 10*3/uL (ref 150–400)
RBC: 4.77 MIL/uL (ref 4.22–5.81)
RDW: 12.2 % (ref 11.5–15.5)
WBC: 6.4 10*3/uL (ref 4.0–10.5)

## 2013-07-04 LAB — BASIC METABOLIC PANEL
BUN: 8 mg/dL (ref 6–23)
CALCIUM: 9 mg/dL (ref 8.4–10.5)
CO2: 23 meq/L (ref 19–32)
CREATININE: 0.6 mg/dL (ref 0.50–1.35)
Chloride: 103 mEq/L (ref 96–112)
GFR calc non Af Amer: >90 mL/min (ref >90)
Glucose, Bld: 85 mg/dL (ref 70–99)
Potassium: 4.3 mEq/L (ref 3.7–5.3)
Sodium: 140 mEq/L (ref 137–147)

## 2013-07-04 LAB — TROPONIN I
Troponin I: <0.3 ng/mL (ref <0.30)
Troponin I: <0.3 ng/mL (ref <0.30)

## 2013-07-04 LAB — MRSA PCR SCREENING: MRSA by PCR: NEGATIVE

## 2013-07-04 LAB — GLUCOSE, CAPILLARY: GLUCOSE-CAPILLARY: 94 mg/dL (ref 70–99)

## 2013-07-04 LAB — PROTIME-INR
INR: 1.16 (ref 0.00–1.49)
PROTHROMBIN TIME: 14.6 s (ref 11.6–15.2)

## 2013-07-04 LAB — HEPARIN LEVEL (UNFRACTIONATED): Heparin Unfractionated: 0.28 IU/mL — ABNORMAL LOW (ref 0.30–0.70)

## 2013-07-04 SURGERY — LEFT HEART CATHETERIZATION WITH CORONARY/GRAFT ANGIOGRAM
Anesthesia: LOCAL

## 2013-07-04 MED ORDER — NITROGLYCERIN 0.2 MG/ML ON CALL CATH LAB
INTRAVENOUS | Status: AC
  Filled 2013-07-04: qty 1

## 2013-07-04 MED ORDER — HEPARIN (PORCINE) IN NACL 2-0.9 UNIT/ML-% IJ SOLN
INTRAMUSCULAR | Status: AC
  Filled 2013-07-04: qty 1000

## 2013-07-04 MED ORDER — AMLODIPINE BESYLATE 5 MG PO TABS
5.0000 mg | ORAL_TABLET | Freq: Every day | ORAL | Status: DC
  Administered 2013-07-04 – 2013-07-05 (×2): 5 mg via ORAL
  Filled 2013-07-04 (×2): qty 1

## 2013-07-04 MED ORDER — ENOXAPARIN SODIUM 40 MG/0.4ML ~~LOC~~ SOLN
40.0000 mg | SUBCUTANEOUS | Status: DC
  Administered 2013-07-05: 40 mg via SUBCUTANEOUS
  Filled 2013-07-04 (×2): qty 0.4

## 2013-07-04 MED ORDER — ATORVASTATIN CALCIUM 80 MG PO TABS
80.0000 mg | ORAL_TABLET | Freq: Every day | ORAL | Status: DC
  Administered 2013-07-05: 80 mg via ORAL
  Filled 2013-07-04: qty 1

## 2013-07-04 MED ORDER — MIDAZOLAM HCL 2 MG/2ML IJ SOLN
INTRAMUSCULAR | Status: AC
  Filled 2013-07-04: qty 2

## 2013-07-04 MED ORDER — OXYCODONE-ACETAMINOPHEN 5-325 MG PO TABS: 1.0000 | ORAL_TABLET | ORAL | Status: DC | PRN

## 2013-07-04 MED ORDER — SODIUM CHLORIDE 0.9 % IV SOLN
INTRAVENOUS | Status: AC
  Administered 2013-07-04: 09:00:00 via INTRAVENOUS

## 2013-07-04 MED ORDER — ISOSORBIDE MONONITRATE ER 60 MG PO TB24
120.0000 mg | ORAL_TABLET | Freq: Every day | ORAL | Status: DC
  Administered 2013-07-04 – 2013-07-05 (×2): 120 mg via ORAL
  Filled 2013-07-04 (×2): qty 2

## 2013-07-04 MED ORDER — LIDOCAINE HCL (PF) 1 % IJ SOLN
INTRAMUSCULAR | Status: AC
  Filled 2013-07-04: qty 30

## 2013-07-04 MED ORDER — FENTANYL CITRATE 0.05 MG/ML IJ SOLN
INTRAMUSCULAR | Status: AC
  Filled 2013-07-04: qty 2

## 2013-07-04 MED ORDER — VERAPAMIL HCL 2.5 MG/ML IV SOLN
INTRAVENOUS | Status: AC
  Filled 2013-07-04: qty 2

## 2013-07-04 MED ORDER — HEPARIN SODIUM (PORCINE) 1000 UNIT/ML IJ SOLN
INTRAMUSCULAR | Status: AC
  Filled 2013-07-04: qty 1

## 2013-07-04 NOTE — Interval H&P Note (Signed)
History and Physical Interval Note:  07/04/2013 7:03 AM I have reviewed the prior angiograms. He has severe diffuse distal vessel disease and bypass failure (two occluded vein grafts and a functionally occluded LIMA to LAD - small vessel with 99% stenosis distal to insertion). The SVG to diagonal is patent. Only treatable scenario is SVG to diagonal. Discussed with patient the risks and benefits, and he is willing to proceed.Cath Lab Visit (complete for each Cath Lab visit)  Clinical Evaluation Leading to the Procedure:   ACS: yes  Non-ACS:    Anginal Classification: CCS III  Anti-ischemic medical therapy: Maximal Therapy (2 or more classes of medications)  Non-Invasive Test Results: No non-invasive testing performed  Prior CABG: Previous CABG       Chris Payne  has presented today for surgery, with the diagnosis of CP  The various methods of treatment have been discussed with the patient and family. After consideration of risks, benefits and other options for treatment, the patient has consented to  Procedure(s): LEFT HEART CATHETERIZATION WITH CORONARY/GRAFT ANGIOGRAM (N/A) as a surgical intervention .  The patient's history has been reviewed, patient examined, no change in status, stable for surgery.  I have reviewed the patient's chart and labs.  Questions were answered to the patient's satisfaction.     Sinclair Grooms

## 2013-07-04 NOTE — Progress Notes (Signed)
Chest pain upon arrival to unit,pt hypertensive-ekg done and pain only for 2 min.

## 2013-07-04 NOTE — Progress Notes (Signed)
SUBJECTIVE:  S/P cath today with no change from prior cath  OBJECTIVE:   Vitals:   Filed Vitals:   07/04/13 1000 07/04/13 1100 07/04/13 1200 07/04/13 1300  BP: 160/97 111/65 110/67 136/109  Pulse: 57 55 50 57  Temp:  98.4 F (36.9 C)    TempSrc:  Oral    Resp: 15 15 14 22   Height:      Weight:      SpO2: 95% 95% 92% 96%   I&O's:   Intake/Output Summary (Last 24 hours) at 07/04/13 1554 Last data filed at 07/04/13 1300  Gross per 24 hour  Intake   1125 ml  Output   1500 ml  Net   -375 ml   TELEMETRY: Reviewed telemetry pt in NSR:     PHYSICAL EXAM General: Well developed, well nourished, in no acute distress Head: Eyes PERRLA, No xanthomas.   Normal cephalic and atramatic  Lungs:   Clear bilaterally to auscultation and percussion. Heart:   HRRR S1 S2 Pulses are 2+ & equal. Abdomen: Bowel sounds are positive, abdomen soft and non-tender without masses  Extremities:   No clubbing, cyanosis or edema.  DP +1 Neuro: Alert and oriented X 3. Psych:  Good affect, responds appropriately   LABS: Basic Metabolic Panel:  Recent Labs  07/03/13 1126 07/04/13 0255  NA 139 140  K 4.1 4.3  CL 100 103  CO2 25 23  GLUCOSE 93 85  BUN 11 8  CREATININE 0.71 0.60  CALCIUM 9.3 9.0   Liver Function Tests:  Recent Labs  07/03/13 1126  AST 18  ALT 14  ALKPHOS 85  BILITOT 0.5  PROT 7.7  ALBUMIN 4.0   No results found for this basename: LIPASE, AMYLASE,  in the last 72 hours CBC:  Recent Labs  07/03/13 1126 07/04/13 0255  WBC 7.7 6.4  HGB 17.6* 16.0  HCT 50.5 46.7  MCV 97.9 97.9  PLT 184 189   Cardiac Enzymes:  Recent Labs  07/03/13 2143 07/04/13 0255 07/04/13 0948  TROPONINI <0.30 <0.30 <0.30   BNP: No components found with this basename: POCBNP,  D-Dimer: No results found for this basename: DDIMER,  in the last 72 hours Hemoglobin A1C: No results found for this basename: HGBA1C,  in the last 72 hours Fasting Lipid Panel:  Recent Labs   07/04/13 0255  CHOL 188  HDL 43  LDLCALC 129*  TRIG 81  CHOLHDL 4.4   Thyroid Function Tests: No results found for this basename: TSH, T4TOTAL, FREET3, T3FREE, THYROIDAB,  in the last 72 hours Anemia Panel: No results found for this basename: VITAMINB12, FOLATE, FERRITIN, TIBC, IRON, RETICCTPCT,  in the last 72 hours Coag Panel:   Lab Results  Component Value Date   INR 1.16 07/04/2013   INR 1.07 07/03/2013   INR 1.03 09/13/2012    RADIOLOGY: Dg Chest 2 View  07/03/2013   CLINICAL DATA:  Chest pain/tightness  EXAM: CHEST  2 VIEW  COMPARISON:  08/2012  FINDINGS: Stable lung base scarring. Lungs are hyperexpanded but otherwise clear.  Cardiac silhouette is normal in size. 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 Normal mediastinal and hilar contours.  Bony thorax is demineralized but intact.  IMPRESSION: No acute cardiopulmonary disease. COPD and stable lung base scarring.   Electronically Signed   By: Lajean Manes M.D.   On: 07/03/2013 12:16   Principle Problem:  1.  Unstable angina Active Problems: 1. Hypertensive heart disease 2. Hyperlipidemia 3. CAD (coronary artery disease) 4. PAD (  peripheral artery disease)  ASSESSMENT/PLAN: 1.  Unstable Angina with negative cardiac enzymes 2.  ASCAD with cath today revealing multiple graft failure with occluded SVG to diag, occluded SVG to acute marginal RCA, patent SVG to RI and patent LIMA to LAD with distal disease and totally occluded distal to graft insertion with left to left collaterals and too small for PCI.  Medical therapy recommended.   - continue ASA - adjust statin as LDL is not at goal - continue Imdur/beta blocker - cannot increase Lopressor further due to bradycardia - add amlodipine for chronic angina as well as  BP control 3.  HTN poorly controlled - add amlodipine - continue Lopressor 4.  Dyslipidemia - LDL still not at goal (129)  - increase atorvastatin to 80mg  daily  - recheck fasting lipid and ALT in 6 weeks 5.   COPD 6.  GERD    Sueanne Margarita, MD  07/04/2013  3:54 PM

## 2013-07-04 NOTE — Progress Notes (Signed)
ANTICOAGULATION CONSULT NOTE - Follow Up Consult  Pharmacy Consult for Heparin  Indication: chest pain/ACS  No Known Allergies  Patient Measurements: Height: 5\' 7"  (170.2 cm) Weight: 168 lb 3.4 oz (76.3 kg) IBW/kg (Calculated) : 66.1  Vital Signs: Temp: 98.4 F (36.9 C) (03/10 0337) Temp src: Oral (03/10 0337) BP: 147/77 mmHg (03/10 0300) Pulse Rate: 52 (03/10 0300)  Labs:  Recent Labs  07/03/13 1126 07/03/13 2143 07/04/13 0255  HGB 17.6*  --  16.0  HCT 50.5  --  46.7  PLT 184  --  189  APTT 35  --   --   LABPROT 13.7  --  14.6  INR 1.07  --  1.16  HEPARINUNFRC  --   --  0.28*  CREATININE 0.71  --  0.60  TROPONINI  --  <0.30 <0.30    Estimated Creatinine Clearance: 87.2 ml/min (by C-G formula based on Cr of 0.6).   Medications:  Heparin 900 units/hr  Assessment: 63 y/o M on heparin for CP. HL 0.28. Other labs as above. No issues per RN. Cath today.   Goal of Therapy:  Heparin level 0.3-0.7 units/ml Monitor platelets by anticoagulation protocol: Yes   Plan:  -Increase heparin drip to 1000 units/hr -1200 HL pending cath -Daily CBC/HL pending cath -Monitor for bleeding  Narda Bonds 07/04/2013,4:51 AM

## 2013-07-04 NOTE — CV Procedure (Signed)
     Left Heart Catheterization with Coronary and Bypass Graft Angiography Report  Chris Payne  63 y.o.  male [birth year]  Procedure Date: 07/04/2013  Referring Physician: Debara Pickett, M.D. Primary Cardiologist:: Vita Barley, MD  INDICATIONS: Prolonged chest pain  PROCEDURE: 1. Left heart catheterization; 2. Coronary angiography; 3. Left internal mammary graft angiography; 4. Saphenous Vein Bypass graft angiography  CONSENT:  The risks, benefits, and details of the procedure were explained in detail to the patient. Risks including death, stroke, heart attack, kidney injury, allergy, limb ischemia, bleeding and radiation injury were discussed.  The patient verbalized understanding and wanted to proceed.  Informed written consent was obtained.  PROCEDURE TECHNIQUE:  After Xylocaine anesthesia a 5 French Slender sheath was placed in the left radial artery after multiple passes using an Angiocath and the Seldinger technique.  Coronary angiography was done using a 5 [place] JR 4, 5 [place] AL-1 diagnostic catheters.  Left ventriculography was not performed because of significant subclavian tortuosity in difficulty crossing the aortic valve.  No palpitations occurred during the procedure.  MEDICATIONS: Versed 1 mg and fentanyl 75 mcg IV  CONTRAST:  Total of 135 cc.  COMPLICATIONS:  None   HEMODYNAMICS:  Aortic pressure 156/86 L mercury; LV pressure not obtained  ANGIOGRAPHIC DATA:   The left main coronary artery is patent with generalized narrowing but no focal obstruction.  The left anterior descending artery is patent to the mid vessel.  The left circumflex artery is patent with a small distal distribution. .  The ramus intermedius is patent and also there is competitive flow with saphenous and graft is inserted distally  The right coronary artery is previously documented to be totally occluded.  BYPASS GRAFT ANGIOGRAPHY:   Left internal mammary graft to the LAD: Widely patent. The  mid to distal LAD is diffusely diseased with a total occlusion distal to the graft insertion site and left to left homocollaterals. The vessel is very small in diameter, less than 2 mm and is felt to be best treated with medical therapy.  Saphenous vein graft to the diagonal: Totally occluded  Saphenous vein graft to the acute marginal of the right coronary: Totally occluded  Saphenous vein graft to the ramus intermedius: Widely patent and unchanged from prior stud  LEFT VENTRICULOGRAM:  Left ventricular angiogram was not performed   IMPRESSIONS:  1. 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  2. Patent LIMA to the LAD with diffuse disease of the LAD beyond the graft insertion site. The LAD is also very small in caliber and is felt to be a reasonable PCI target site.  3. Total occlusion of the right coronary, total occlusion of the mid LAD, and diffuse narrowing of the left main.  4. Left ventriculogram not performed  5. No change in anatomy since prior catheterization approximately one year ago   RECOMMENDATION:  Aggressive medical therapy. The patient will have angina. Not a candidate for repeat cath unless enzyme elevation with evidence of STEMI.

## 2013-07-04 NOTE — Progress Notes (Signed)
Bilateral groin redness/yeastlike R.L w/ odor noted-cleansed and placed antifungal powder.

## 2013-07-05 ENCOUNTER — Other Ambulatory Visit: Payer: Self-pay | Admitting: Physician Assistant

## 2013-07-05 DIAGNOSIS — R079 Chest pain, unspecified: Secondary | ICD-10-CM

## 2013-07-05 DIAGNOSIS — E785 Hyperlipidemia, unspecified: Secondary | ICD-10-CM

## 2013-07-05 LAB — CBC
HCT: 44.2 % (ref 39.0–52.0)
Hemoglobin: 14.9 g/dL (ref 13.0–17.0)
MCH: 33.6 pg (ref 26.0–34.0)
MCHC: 33.7 g/dL (ref 30.0–36.0)
MCV: 99.5 fL (ref 78.0–100.0)
Platelets: 174 10*3/uL (ref 150–400)
RBC: 4.44 MIL/uL (ref 4.22–5.81)
RDW: 12.6 % (ref 11.5–15.5)
WBC: 7.1 10*3/uL (ref 4.0–10.5)

## 2013-07-05 MED ORDER — ATORVASTATIN CALCIUM 80 MG PO TABS: 80.0000 mg | ORAL_TABLET | Freq: Every day | ORAL | Status: DC

## 2013-07-05 MED ORDER — METOPROLOL TARTRATE 25 MG PO TABS: 12.5000 mg | ORAL_TABLET | Freq: Two times a day (BID) | ORAL | Status: DC

## 2013-07-05 MED ORDER — RANOLAZINE ER 500 MG PO TB12
1000.0000 mg | ORAL_TABLET | Freq: Two times a day (BID) | ORAL | Status: DC
  Administered 2013-07-05: 1000 mg via ORAL
  Filled 2013-07-05 (×2): qty 2

## 2013-07-05 MED ORDER — RANOLAZINE ER 1000 MG PO TB12: 1000.0000 mg | ORAL_TABLET | Freq: Two times a day (BID) | ORAL | Status: DC

## 2013-07-05 MED ORDER — AMLODIPINE BESYLATE 5 MG PO TABS: 5.0000 mg | ORAL_TABLET | Freq: Every day | ORAL | Status: DC

## 2013-07-05 MED ORDER — ISOSORBIDE MONONITRATE ER 120 MG PO TB24: 120.0000 mg | ORAL_TABLET | Freq: Every day | ORAL | Status: DC

## 2013-07-05 NOTE — Progress Notes (Signed)
DAILY PROGRESS NOTE  Subjective:  BP appears to be improved with addition of amlodipine. Cath revealed no change in anatomy with collaterals.  Objective:  Temp:  [97.7 F (36.5 C)-98.7 F (37.1 C)] 97.8 F (36.6 C) (03/11 0616) Pulse Rate:  [46-61] 57 (03/11 0616) Resp:  [13-22] 18 (03/11 0616) BP: (101-182)/(56-111) 125/70 mmHg (03/11 0616) SpO2:  [88 %-96 %] 92 % (03/11 0616) Weight:  [171 lb 11.8 oz (77.9 kg)] 171 lb 11.8 oz (77.9 kg) (03/11 0616) Weight change: 1 lb 11.8 oz (0.788 kg)  Intake/Output from previous day: 03/10 0701 - 03/11 0700 In: 1535 [P.O.:720; I.V.:815] Out: 450 [Urine:450]  Intake/Output from this shift:    Medications: Current Facility-Administered Medications  Medication Dose Route Frequency Provider Last Rate Last Dose  . alum & mag hydroxide-simeth (MAALOX/MYLANTA) 200-200-20 MG/5ML suspension 30 mL  30 mL Oral Q4H PRN Pixie Casino, MD   30 mL at 07/03/13 2215  . amLODipine (NORVASC) tablet 5 mg  5 mg Oral Daily Sueanne Margarita, MD   5 mg at 07/04/13 1746  . aspirin EC tablet 81 mg  81 mg Oral Daily Pixie Casino, MD   81 mg at 07/04/13 0930  . atorvastatin (LIPITOR) tablet 80 mg  80 mg Oral Daily Sueanne Margarita, MD      . enoxaparin (LOVENOX) injection 40 mg  40 mg Subcutaneous Q24H Belva Crome III, MD      . gabapentin (NEURONTIN) capsule 300 mg  300 mg Oral q morning - 10a Pixie Casino, MD   300 mg at 07/04/13 9528  . gabapentin (NEURONTIN) capsule 600 mg  600 mg Oral QHS Pixie Casino, MD   600 mg at 07/04/13 2105  . HYDROcodone-acetaminophen (NORCO) 10-325 MG per tablet 2 tablet  2 tablet Oral Q12H PRN Pixie Casino, MD      . isosorbide mononitrate (IMDUR) 24 hr tablet 120 mg  120 mg Oral Daily Belva Crome III, MD   120 mg at 07/04/13 1021  . metoprolol tartrate (LOPRESSOR) tablet 12.5 mg  12.5 mg Oral BID Pixie Casino, MD   12.5 mg at 07/04/13 2106  . nitroGLYCERIN 0.2 mg/mL in dextrose 5 % infusion  3-30 mcg/min  Intravenous Titrated Pixie Casino, MD 6 mL/hr at 07/04/13 0600 20 mcg/min at 07/04/13 0600  . oxyCODONE-acetaminophen (PERCOCET/ROXICET) 5-325 MG per tablet 1-2 tablet  1-2 tablet Oral Q4H PRN Sinclair Grooms, MD        Physical Exam: General appearance: alert and no distress Lungs: clear to auscultation bilaterally Heart: regular rate and rhythm, S1, S2 normal, no murmur, click, rub or gallop Extremities: extremities normal, atraumatic, no cyanosis or edema and left radial cath site intact, no bruit or hematoma  Lab Results: Results for orders placed during the hospital encounter of 07/03/13 (from the past 48 hour(s))  APTT     Status: None   Collection Time    07/03/13 11:26 AM      Result Value Ref Range   aPTT 35  24 - 37 seconds  CBC     Status: Abnormal   Collection Time    07/03/13 11:26 AM      Result Value Ref Range   WBC 7.7  4.0 - 10.5 K/uL   RBC 5.16  4.22 - 5.81 MIL/uL   Hemoglobin 17.6 (*) 13.0 - 17.0 g/dL   HCT 50.5  39.0 - 52.0 %   MCV 97.9  78.0 -  100.0 fL   MCH 34.1 (*) 26.0 - 34.0 pg   MCHC 34.9  30.0 - 36.0 g/dL   RDW 12.1  11.5 - 15.5 %   Platelets 184  150 - 400 K/uL  COMPREHENSIVE METABOLIC PANEL     Status: None   Collection Time    07/03/13 11:26 AM      Result Value Ref Range   Sodium 139  137 - 147 mEq/L   Potassium 4.1  3.7 - 5.3 mEq/L   Chloride 100  96 - 112 mEq/L   CO2 25  19 - 32 mEq/L   Glucose, Bld 93  70 - 99 mg/dL   BUN 11  6 - 23 mg/dL   Creatinine, Ser 0.71  0.50 - 1.35 mg/dL   Calcium 9.3  8.4 - 10.5 mg/dL   Total Protein 7.7  6.0 - 8.3 g/dL   Albumin 4.0  3.5 - 5.2 g/dL   AST 18  0 - 37 U/L   ALT 14  0 - 53 U/L   Alkaline Phosphatase 85  39 - 117 U/L   Total Bilirubin 0.5  0.3 - 1.2 mg/dL   GFR calc non Af Amer >90  >90 mL/min   GFR calc Af Amer >90  >90 mL/min   Comment: (NOTE)     The eGFR has been calculated using the CKD EPI equation.     This calculation has not been validated in all clinical situations.      eGFR's persistently <90 mL/min signify possible Chronic Kidney     Disease.  PROTIME-INR     Status: None   Collection Time    07/03/13 11:26 AM      Result Value Ref Range   Prothrombin Time 13.7  11.6 - 15.2 seconds   INR 1.07  0.00 - 1.49  I-STAT TROPOININ, ED     Status: None   Collection Time    07/03/13  1:28 PM      Result Value Ref Range   Troponin i, poc 0.00  0.00 - 0.08 ng/mL   Comment 3            Comment: Due to the release kinetics of cTnI,     a negative result within the first hours     of the onset of symptoms does not rule out     myocardial infarction with certainty.     If myocardial infarction is still suspected,     repeat the test at appropriate intervals.  Randolm Idol, ED     Status: None   Collection Time    07/03/13  4:48 PM      Result Value Ref Range   Troponin i, poc 0.00  0.00 - 0.08 ng/mL   Comment 3            Comment: Due to the release kinetics of cTnI,     a negative result within the first hours     of the onset of symptoms does not rule out     myocardial infarction with certainty.     If myocardial infarction is still suspected,     repeat the test at appropriate intervals.  TROPONIN I     Status: None   Collection Time    07/03/13  9:43 PM      Result Value Ref Range   Troponin I <0.30  <0.30 ng/mL   Comment:            Due to the  release kinetics of cTnI,     a negative result within the first hours     of the onset of symptoms does not rule out     myocardial infarction with certainty.     If myocardial infarction is still suspected,     repeat the test at appropriate intervals.  MRSA PCR SCREENING     Status: None   Collection Time    07/03/13  9:52 PM      Result Value Ref Range   MRSA by PCR NEGATIVE  NEGATIVE   Comment:            The GeneXpert MRSA Assay (FDA     approved for NASAL specimens     only), is one component of a     comprehensive MRSA colonization     surveillance program. It is not     intended to  diagnose MRSA     infection nor to guide or     monitor treatment for     MRSA infections.  HEPARIN LEVEL (UNFRACTIONATED)     Status: Abnormal   Collection Time    07/04/13  2:55 AM      Result Value Ref Range   Heparin Unfractionated 0.28 (*) 0.30 - 0.70 IU/mL   Comment:            IF HEPARIN RESULTS ARE BELOW     EXPECTED VALUES, AND PATIENT     DOSAGE HAS BEEN CONFIRMED,     SUGGEST FOLLOW UP TESTING     OF ANTITHROMBIN III LEVELS.  TROPONIN I     Status: None   Collection Time    07/04/13  2:55 AM      Result Value Ref Range   Troponin I <0.30  <0.30 ng/mL   Comment:            Due to the release kinetics of cTnI,     a negative result within the first hours     of the onset of symptoms does not rule out     myocardial infarction with certainty.     If myocardial infarction is still suspected,     repeat the test at appropriate intervals.  CBC     Status: None   Collection Time    07/04/13  2:55 AM      Result Value Ref Range   WBC 6.4  4.0 - 10.5 K/uL   RBC 4.77  4.22 - 5.81 MIL/uL   Hemoglobin 16.0  13.0 - 17.0 g/dL   HCT 46.7  39.0 - 52.0 %   MCV 97.9  78.0 - 100.0 fL   MCH 33.5  26.0 - 34.0 pg   MCHC 34.3  30.0 - 36.0 g/dL   RDW 12.2  11.5 - 15.5 %   Platelets 189  150 - 400 K/uL  BASIC METABOLIC PANEL     Status: None   Collection Time    07/04/13  2:55 AM      Result Value Ref Range   Sodium 140  137 - 147 mEq/L   Potassium 4.3  3.7 - 5.3 mEq/L   Comment: HEMOLYSIS AT THIS LEVEL MAY AFFECT RESULT   Chloride 103  96 - 112 mEq/L   CO2 23  19 - 32 mEq/L   Glucose, Bld 85  70 - 99 mg/dL   BUN 8  6 - 23 mg/dL   Creatinine, Ser 0.60  0.50 - 1.35 mg/dL   Calcium 9.0  8.4 -  10.5 mg/dL   GFR calc non Af Amer >90  >90 mL/min   GFR calc Af Amer >90  >90 mL/min   Comment: (NOTE)     The eGFR has been calculated using the CKD EPI equation.     This calculation has not been validated in all clinical situations.     eGFR's persistently <90 mL/min signify possible  Chronic Kidney     Disease.  PROTIME-INR     Status: None   Collection Time    07/04/13  2:55 AM      Result Value Ref Range   Prothrombin Time 14.6  11.6 - 15.2 seconds   INR 1.16  0.00 - 1.49  LIPID PANEL     Status: Abnormal   Collection Time    07/04/13  2:55 AM      Result Value Ref Range   Cholesterol 188  0 - 200 mg/dL   Triglycerides 81  <150 mg/dL   HDL 43  >39 mg/dL   Total CHOL/HDL Ratio 4.4     VLDL 16  0 - 40 mg/dL   LDL Cholesterol 129 (*) 0 - 99 mg/dL   Comment:            Total Cholesterol/HDL:CHD Risk     Coronary Heart Disease Risk Table                         Men   Women      1/2 Average Risk   3.4   3.3      Average Risk       5.0   4.4      2 X Average Risk   9.6   7.1      3 X Average Risk  23.4   11.0                Use the calculated Patient Ratio     above and the CHD Risk Table     to determine the patient's CHD Risk.                ATP III CLASSIFICATION (LDL):      <100     mg/dL   Optimal      100-129  mg/dL   Near or Above                        Optimal      130-159  mg/dL   Borderline      160-189  mg/dL   High      >190     mg/dL   Very High  GLUCOSE, CAPILLARY     Status: None   Collection Time    07/04/13  9:00 AM      Result Value Ref Range   Glucose-Capillary 94  70 - 99 mg/dL  TROPONIN I     Status: None   Collection Time    07/04/13  9:48 AM      Result Value Ref Range   Troponin I <0.30  <0.30 ng/mL   Comment:            Due to the release kinetics of cTnI,     a negative result within the first hours     of the onset of symptoms does not rule out     myocardial infarction with certainty.     If myocardial infarction is still suspected,  repeat the test at appropriate intervals.  CBC     Status: None   Collection Time    07/05/13  4:39 AM      Result Value Ref Range   WBC 7.1  4.0 - 10.5 K/uL   RBC 4.44  4.22 - 5.81 MIL/uL   Hemoglobin 14.9  13.0 - 17.0 g/dL   HCT 44.2  39.0 - 52.0 %   MCV 99.5  78.0 - 100.0 fL     MCH 33.6  26.0 - 34.0 pg   MCHC 33.7  30.0 - 36.0 g/dL   RDW 12.6  11.5 - 15.5 %   Platelets 174  150 - 400 K/uL    Imaging: Dg Chest 2 View  07/03/2013   CLINICAL DATA:  Chest pain/tightness  EXAM: CHEST  2 VIEW  COMPARISON:  08/2012  FINDINGS: Stable lung base scarring. Lungs are hyperexpanded but otherwise clear.  Cardiac silhouette is normal in size. 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 Normal mediastinal and hilar contours.  Bony thorax is demineralized but intact.  IMPRESSION: No acute cardiopulmonary disease. COPD and stable lung base scarring.   Electronically Signed   By: Lajean Manes M.D.   On: 07/03/2013 12:16    Assessment:  1. Principal Problem: 2.   Unstable angina 3. Active Problems: 4.   Hypertensive heart disease 5.   Hyperlipidemia 6.   CAD (coronary artery disease) 7.   PAD (peripheral artery disease) 8.   Other and unspecified angina pectoris 9.   Plan:  1. O'Brien for discharge home today. Statin increased yesterday. Amlodipine added. Will also add Ranexa 1000 mg BID for anti-anginal benefit. Plan for follow-up in 7-10 days with Dr. Percival Spanish.   Time Spent Directly with Patient:  15 minutes  Length of Stay:  LOS: 2 days   Pixie Casino, MD, [facility] Attending Cardiologist [facility]  Gyneth Hubka C 07/05/2013, 8:34 AM

## 2013-07-05 NOTE — Discharge Instructions (Signed)
Future labs:  Cholesterol August 16, 2013.  Needs to be fasting.

## 2013-07-05 NOTE — Discharge Summary (Signed)
Physician Discharge Summary     Patient ID: Chris Payne MRN: [id number] DOB/AGE: 64/12/1950 64 y.o.  Admit date: 07/03/2013 Discharge date: 07/05/2013 Cardiologist:  Hochrein  Admission Diagnoses:  Unstable angina  Discharge Diagnoses:  Principal Problem:   Unstable angina Active Problems:   Hypertensive heart disease   Hyperlipidemia   CAD (coronary artery disease)   PAD (peripheral artery disease)   Other and unspecified angina pectoris   Discharged Condition: stable  Hospital Course:   This is a 63 y.o. male with a past medical history significant for abdominal aortic aneurysm requiring surgical repair. 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 He has severe three-vessel native coronary artery disease. Catheterization last year demonstrated a LIMA to the LAD is patent but with high-grade disease distal to the graft insertion, SVG to the ramus intermediate is occluded, SVG to the PDA is occluded, SVG to the diagonal is widely patent. He does have aortic root dilatation 44 mm by echo 45 by CT. He was last seen by Dr. Percival Spanish about 1 month ago and was reporting what was thought to be stable angina. He said he took nitroglycerin about twice a month but had ran out of it. His imdur was increased to 60 mg daily last month and then he reports 2 weeks after that he started having worsening chest pain that crescendoed to the point where he came in today. He took 2 additional SL nitro this morning with relief of his symptoms.  The patient was admitted.  IV heparin and NTG were started.  Ruled out for MI. Left heart cath was completed and revealed occlusion of the saphenous vein graft to the diagonal and to the acute marginal branch of the right coronary, patent LIMA to the LAD with diffuse disease of the LAD beyond the graft insertion site, Total occlusion of the right coronary, total occlusion of the mid LAD, and diffuse narrowing of the left main. No change in anatomy  from prior cath.  Imdur was increased to 120mg .  Amlodipine was increased for better BP control.  lipitor increased to 80mg .  Ranexa was added.  The patient was seen by Dr. Debara Pickett who felt he was stable for DC home.  Follow up lipids is 6 weeks.    Outpatient needs:  Lipid panel ordered for August 16, 2013.   Consults: None  Significant Diagnostic Studies:  LEft heart cath PROCEDURE TECHNIQUE: After Xylocaine anesthesia a 5 French Slender sheath was placed in the left radial artery after multiple passes using an Angiocath and the Seldinger technique. Coronary angiography was done using a 5 [place] JR 4, 5 [place] AL-1 diagnostic catheters. Left ventriculography was not performed because of significant subclavian tortuosity in difficulty crossing the aortic valve.  No palpitations occurred during the procedure.  MEDICATIONS: Versed 1 mg and fentanyl 75 mcg IV  CONTRAST: Total of 135 cc.  COMPLICATIONS: None  HEMODYNAMICS: Aortic pressure 156/86 L mercury; LV pressure not obtained  ANGIOGRAPHIC DATA: The left main coronary artery is patent with generalized narrowing but no focal obstruction.  The left anterior descending artery is patent to the mid vessel.  The left circumflex artery is patent with a small distal distribution.  .  The ramus intermedius is patent and also there is competitive flow with saphenous and graft is inserted distally  The right coronary artery is previously documented to be totally occluded.  BYPASS GRAFT ANGIOGRAPHY:  Left internal mammary graft to the LAD: Widely patent. The mid to  distal LAD is diffusely diseased with a total occlusion distal to the graft insertion site and left to left homocollaterals. The vessel is very small in diameter, less than 2 mm and is felt to be best treated with medical therapy.  Saphenous vein graft to the diagonal: Totally occluded  Saphenous vein graft to the acute marginal of the right coronary: Totally occluded  Saphenous vein graft to  the ramus intermedius: Widely patent and unchanged from prior stud  LEFT VENTRICULOGRAM: Left ventricular angiogram was not performed  IMPRESSIONS: 1. 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  2. Patent LIMA to the LAD with diffuse disease of the LAD beyond the graft insertion site. The LAD is also very small in caliber and is felt to be a reasonable PCI target site.  3. Total occlusion of the right coronary, total occlusion of the mid LAD, and diffuse narrowing of the left main.  4. Left ventriculogram not performed  5. No change in anatomy since prior catheterization approximately one year ago  RECOMMENDATION: Aggressive medical therapy. The patient will have angina. Not a candidate for repeat cath unless enzyme elevation with evidence of STEMI.   Treatments: see above  Discharge Exam: Blood pressure 125/70, pulse 57, temperature 97.8 F (36.6 C), temperature source Oral, resp. rate 18, height 5\' 7"  (1.702 m), weight 171 lb 11.8 oz (77.9 kg), SpO2 92.00%.   Disposition: 01-Home or Self Care      Discharge Orders   Future Appointments Provider Department Dept Phone   07/19/2013 12:00 PM [facility] P[facility] 3 [facility] [address] [phone]   07/20/2013 8:30 AM Beatrice Lecher, PA-C [facility] [phone]   Future Orders Complete By Expires   Diet - low sodium heart healthy  As directed    Increase activity slowly  As directed        Medication List         ADVIL COLD/SINUS PO  Take 1 tablet by mouth daily as needed (cold).     amLODipine 5 MG tablet  Commonly known as:  NORVASC  Take 1 tablet (5 mg total) by mouth daily.     aspirin EC 81 MG tablet  Take 81 mg by mouth daily.     atorvastatin 40 MG tablet  Commonly known as:  LIPITOR  Take 1 tablet (40 mg total) by mouth daily.     atorvastatin 80 MG tablet  Commonly known as:  LIPITOR   Take 1 tablet (80 mg total) by mouth daily.     gabapentin 300 MG capsule  Commonly known as:  NEURONTIN  Take 1 capsule every morning and 2 capsules at bedtime.     HYDROcodone-acetaminophen 10-325 MG per tablet  Commonly known as:  NORCO  Take 2 tablets by mouth every 12 (twelve) hours as needed for moderate pain.     isosorbide mononitrate 120 MG 24 hr tablet  Commonly known as:  IMDUR  Take 1 tablet (120 mg total) by mouth daily.     metoprolol tartrate 25 MG tablet  Commonly known as:  LOPRESSOR  Take 0.5 tablets (12.5 mg total) by mouth 2 (two) times daily.     nitroGLYCERIN 0.4 MG SL tablet  Commonly known as:  NITROSTAT  Place 0.4 mg under the tongue every 5 (five) minutes as needed for chest pain.     nystatin ointment  Commonly known as:  MYCOSTATIN  Apply 1 application topically daily as needed (for leg.). For leg area     ranolazine 1000 MG SR tablet  Commonly known as:  RANEXA  Take 1 tablet (1,000 mg total) by mouth 2 (two) times daily.       Follow-up Information   Follow up with Richardson Dopp, PA-C On 07/20/2013. (8:30am)    Specialty:  Physician Assistant   Contact information:   [address] [place] [address] [phone]       Signed: Tarri Fuller 07/05/2013, 9:37 AM

## 2013-07-10 ENCOUNTER — Encounter (HOSPITAL_COMMUNITY): Payer: Self-pay | Admitting: Emergency Medicine

## 2013-07-10 ENCOUNTER — Emergency Department (HOSPITAL_COMMUNITY): Payer: Medicare HMO

## 2013-07-10 ENCOUNTER — Inpatient Hospital Stay (HOSPITAL_COMMUNITY)
Admission: EM | Admit: 2013-07-10 | Discharge: 2013-07-12 | DRG: 194 | Disposition: A | Discharge status: Home Health | Payer: Medicare HMO | Attending: Family Medicine | Admitting: Family Medicine

## 2013-07-10 DIAGNOSIS — J4489 Other specified chronic obstructive pulmonary disease: Secondary | ICD-10-CM | POA: Diagnosis present

## 2013-07-10 DIAGNOSIS — Z7982 Long term (current) use of aspirin: Secondary | ICD-10-CM

## 2013-07-10 DIAGNOSIS — M519 Unspecified thoracic, thoracolumbar and lumbosacral intervertebral disc disorder: Secondary | ICD-10-CM | POA: Diagnosis present

## 2013-07-10 DIAGNOSIS — R5381 Other malaise: Secondary | ICD-10-CM

## 2013-07-10 DIAGNOSIS — R531 Weakness: Secondary | ICD-10-CM

## 2013-07-10 DIAGNOSIS — R600 Localized edema: Secondary | ICD-10-CM

## 2013-07-10 DIAGNOSIS — J449 Chronic obstructive pulmonary disease, unspecified: Secondary | ICD-10-CM

## 2013-07-10 DIAGNOSIS — I739 Peripheral vascular disease, unspecified: Secondary | ICD-10-CM

## 2013-07-10 DIAGNOSIS — E785 Hyperlipidemia, unspecified: Secondary | ICD-10-CM

## 2013-07-10 DIAGNOSIS — M79605 Pain in left leg: Secondary | ICD-10-CM

## 2013-07-10 DIAGNOSIS — I252 Old myocardial infarction: Secondary | ICD-10-CM

## 2013-07-10 DIAGNOSIS — R1319 Other dysphagia: Secondary | ICD-10-CM | POA: Diagnosis present

## 2013-07-10 DIAGNOSIS — E042 Nontoxic multinodular goiter: Secondary | ICD-10-CM | POA: Diagnosis present

## 2013-07-10 DIAGNOSIS — E871 Hypo-osmolality and hyponatremia: Secondary | ICD-10-CM | POA: Diagnosis present

## 2013-07-10 DIAGNOSIS — Z23 Encounter for immunization: Secondary | ICD-10-CM

## 2013-07-10 DIAGNOSIS — Z87891 Personal history of nicotine dependence: Secondary | ICD-10-CM

## 2013-07-10 DIAGNOSIS — Z8673 Personal history of transient ischemic attack (TIA), and cerebral infarction without residual deficits: Secondary | ICD-10-CM

## 2013-07-10 DIAGNOSIS — I6529 Occlusion and stenosis of unspecified carotid artery: Secondary | ICD-10-CM | POA: Diagnosis present

## 2013-07-10 DIAGNOSIS — M79604 Pain in right leg: Secondary | ICD-10-CM

## 2013-07-10 DIAGNOSIS — I2582 Chronic total occlusion of coronary artery: Secondary | ICD-10-CM | POA: Diagnosis present

## 2013-07-10 DIAGNOSIS — I25810 Atherosclerosis of coronary artery bypass graft(s) without angina pectoris: Secondary | ICD-10-CM | POA: Diagnosis present

## 2013-07-10 DIAGNOSIS — I872 Venous insufficiency (chronic) (peripheral): Secondary | ICD-10-CM | POA: Diagnosis present

## 2013-07-10 DIAGNOSIS — I2510 Atherosclerotic heart disease of native coronary artery without angina pectoris: Secondary | ICD-10-CM

## 2013-07-10 DIAGNOSIS — J189 Pneumonia, unspecified organism: Secondary | ICD-10-CM

## 2013-07-10 DIAGNOSIS — R5383 Other fatigue: Secondary | ICD-10-CM

## 2013-07-10 LAB — DIFFERENTIAL
BASOS PCT: 0 % (ref 0–1)
Basophils Absolute: 0 10*3/uL (ref 0.0–0.1)
EOS ABS: 0.1 10*3/uL (ref 0.0–0.7)
Eosinophils Relative: 1 % (ref 0–5)
LYMPHS ABS: 0.8 10*3/uL (ref 0.7–4.0)
Lymphocytes Relative: 8 % — ABNORMAL LOW (ref 12–46)
Monocytes Absolute: 0.9 10*3/uL (ref 0.1–1.0)
Monocytes Relative: 8 % (ref 3–12)
Neutro Abs: 9.1 10*3/uL — ABNORMAL HIGH (ref 1.7–7.7)
Neutrophils Relative %: 84 % — ABNORMAL HIGH (ref 43–77)

## 2013-07-10 LAB — I-STAT TROPONIN, ED: TROPONIN I, POC: 0 ng/mL (ref 0.00–0.08)

## 2013-07-10 LAB — URINALYSIS, ROUTINE W REFLEX MICROSCOPIC
Bilirubin Urine: NEGATIVE
GLUCOSE, UA: NEGATIVE mg/dL
Hgb urine dipstick: NEGATIVE
KETONES UR: NEGATIVE mg/dL
Leukocytes, UA: NEGATIVE
Nitrite: NEGATIVE
Protein, ur: NEGATIVE mg/dL
Specific Gravity, Urine: 1.02 (ref 1.005–1.030)
Urobilinogen, UA: 1 mg/dL (ref 0.0–1.0)
pH: 7.5 (ref 5.0–8.0)

## 2013-07-10 LAB — RAPID URINE DRUG SCREEN, HOSP PERFORMED
AMPHETAMINES: NOT DETECTED
BENZODIAZEPINES: NOT DETECTED
Barbiturates: NOT DETECTED
Cocaine: NOT DETECTED
OPIATES: POSITIVE — AB
Tetrahydrocannabinol: NOT DETECTED

## 2013-07-10 LAB — COMPREHENSIVE METABOLIC PANEL
ALBUMIN: 3.7 g/dL (ref 3.5–5.2)
ALT: 12 U/L (ref 0–53)
AST: 16 U/L (ref 0–37)
Alkaline Phosphatase: 80 U/L (ref 39–117)
BUN: 15 mg/dL (ref 6–23)
CALCIUM: 9.3 mg/dL (ref 8.4–10.5)
CO2: 26 mEq/L (ref 19–32)
Chloride: 96 mEq/L (ref 96–112)
Creatinine, Ser: 0.79 mg/dL (ref 0.50–1.35)
GFR calc Af Amer: >90 mL/min (ref >90)
GFR calc non Af Amer: >90 mL/min (ref >90)
Glucose, Bld: 94 mg/dL (ref 70–99)
Potassium: 4.3 mEq/L (ref 3.7–5.3)
Sodium: 136 mEq/L — ABNORMAL LOW (ref 137–147)
TOTAL PROTEIN: 7.4 g/dL (ref 6.0–8.3)
Total Bilirubin: 0.7 mg/dL (ref 0.3–1.2)

## 2013-07-10 LAB — LACTIC ACID, PLASMA: Lactic Acid, Venous: 1.3 mmol/L (ref 0.5–2.2)

## 2013-07-10 LAB — CBC
HCT: 50 % (ref 39.0–52.0)
HEMOGLOBIN: 17.1 g/dL — AB (ref 13.0–17.0)
MCH: 33.9 pg (ref 26.0–34.0)
MCHC: 34.2 g/dL (ref 30.0–36.0)
MCV: 99.2 fL (ref 78.0–100.0)
Platelets: 170 10*3/uL (ref 150–400)
RBC: 5.04 MIL/uL (ref 4.22–5.81)
RDW: 12.8 % (ref 11.5–15.5)
WBC: 10.8 10*3/uL — ABNORMAL HIGH (ref 4.0–10.5)

## 2013-07-10 LAB — PROTIME-INR
INR: 1.04 (ref 0.00–1.49)
Prothrombin Time: 13.4 seconds (ref 11.6–15.2)

## 2013-07-10 LAB — APTT: aPTT: 33 seconds (ref 24–37)

## 2013-07-10 LAB — ETHANOL: Alcohol, Ethyl (B): <11 mg/dL (ref 0–11)

## 2013-07-10 LAB — TROPONIN I

## 2013-07-10 MED ORDER — AMLODIPINE BESYLATE 5 MG PO TABS
5.0000 mg | ORAL_TABLET | Freq: Every day | ORAL | Status: DC
  Administered 2013-07-10 – 2013-07-12 (×3): 5 mg via ORAL
  Filled 2013-07-10 (×3): qty 1

## 2013-07-10 MED ORDER — RESOURCE THICKENUP CLEAR PO POWD
ORAL | Status: DC | PRN
  Filled 2013-07-10: qty 125

## 2013-07-10 MED ORDER — ASPIRIN EC 81 MG PO TBEC
81.0000 mg | DELAYED_RELEASE_TABLET | Freq: Every day | ORAL | Status: DC
  Administered 2013-07-10 – 2013-07-11 (×2): 81 mg via ORAL
  Filled 2013-07-10 (×3): qty 1

## 2013-07-10 MED ORDER — GABAPENTIN 300 MG PO CAPS
300600.0000 mg | ORAL_CAPSULE | Freq: Two times a day (BID) | ORAL | Status: DC
  Filled 2013-07-10: qty 1002

## 2013-07-10 MED ORDER — GABAPENTIN 600 MG PO TABS
600.0000 mg | ORAL_TABLET | Freq: Every day | ORAL | Status: DC
  Administered 2013-07-10 – 2013-07-11 (×2): 600 mg via ORAL
  Filled 2013-07-10 (×3): qty 1

## 2013-07-10 MED ORDER — METOPROLOL TARTRATE 12.5 MG HALF TABLET
12.5000 mg | ORAL_TABLET | Freq: Two times a day (BID) | ORAL | Status: DC
  Administered 2013-07-10 – 2013-07-12 (×4): 12.5 mg via ORAL
  Filled 2013-07-10 (×5): qty 1

## 2013-07-10 MED ORDER — VANCOMYCIN HCL IN DEXTROSE 750-5 MG/150ML-% IV SOLN
750.0000 mg | Freq: Three times a day (TID) | INTRAVENOUS | Status: DC
  Administered 2013-07-10 – 2013-07-11 (×3): 750 mg via INTRAVENOUS
  Filled 2013-07-10 (×4): qty 150

## 2013-07-10 MED ORDER — RANOLAZINE ER 500 MG PO TB12
1000.0000 mg | ORAL_TABLET | Freq: Two times a day (BID) | ORAL | Status: DC
  Administered 2013-07-10 – 2013-07-12 (×5): 1000 mg via ORAL
  Filled 2013-07-10 (×6): qty 2

## 2013-07-10 MED ORDER — ISOSORBIDE MONONITRATE ER 60 MG PO TB24
120.0000 mg | ORAL_TABLET | Freq: Every day | ORAL | Status: DC
  Administered 2013-07-10 – 2013-07-12 (×3): 120 mg via ORAL
  Filled 2013-07-10 (×3): qty 2

## 2013-07-10 MED ORDER — PIPERACILLIN-TAZOBACTAM 3.375 G IVPB
3.3750 g | Freq: Three times a day (TID) | INTRAVENOUS | Status: DC
  Administered 2013-07-10 – 2013-07-11 (×3): 3.375 g via INTRAVENOUS
  Filled 2013-07-10 (×5): qty 50

## 2013-07-10 MED ORDER — ATORVASTATIN CALCIUM 80 MG PO TABS
80.0000 mg | ORAL_TABLET | Freq: Every day | ORAL | Status: DC
  Administered 2013-07-10 – 2013-07-12 (×3): 80 mg via ORAL
  Filled 2013-07-10 (×3): qty 1

## 2013-07-10 MED ORDER — NITROGLYCERIN 0.4 MG SL SUBL: 0.4000 mg | SUBLINGUAL_TABLET | SUBLINGUAL | Status: DC | PRN

## 2013-07-10 MED ORDER — SODIUM CHLORIDE 0.9 % IV SOLN
INTRAVENOUS | Status: DC
  Administered 2013-07-10: 75 mL/h via INTRAVENOUS
  Administered 2013-07-12: 02:00:00 via INTRAVENOUS

## 2013-07-10 MED ORDER — VANCOMYCIN HCL 10 G IV SOLR
1.0000 g | Freq: Once | INTRAVENOUS | Status: DC
  Filled 2013-07-10: qty 1000

## 2013-07-10 MED ORDER — PANTOPRAZOLE SODIUM 40 MG IV SOLR
40.0000 mg | INTRAVENOUS | Status: DC
  Administered 2013-07-10: 40 mg via INTRAVENOUS
  Filled 2013-07-10 (×2): qty 40

## 2013-07-10 MED ORDER — IPRATROPIUM-ALBUTEROL 0.5-2.5 (3) MG/3ML IN SOLN
3.0000 mL | Freq: Once | RESPIRATORY_TRACT | Status: AC
  Administered 2013-07-10: 3 mL via RESPIRATORY_TRACT
  Filled 2013-07-10: qty 3

## 2013-07-10 MED ORDER — HYDROCODONE-ACETAMINOPHEN 10-325 MG PO TABS: 2.0000 | ORAL_TABLET | Freq: Two times a day (BID) | ORAL | Status: DC | PRN

## 2013-07-10 MED ORDER — MORPHINE SULFATE ER 15 MG PO TBCR
30.0000 mg | EXTENDED_RELEASE_TABLET | Freq: Two times a day (BID) | ORAL | Status: DC
  Administered 2013-07-10 – 2013-07-12 (×4): 30 mg via ORAL
  Filled 2013-07-10 (×4): qty 2

## 2013-07-10 MED ORDER — PIPERACILLIN-TAZOBACTAM 3.375 G IVPB
3.3750 g | Freq: Once | INTRAVENOUS | Status: AC
  Administered 2013-07-10: 3.375 g via INTRAVENOUS
  Filled 2013-07-10: qty 50

## 2013-07-10 MED ORDER — HEPARIN SODIUM (PORCINE) 5000 UNIT/ML IJ SOLN
5000.0000 [IU] | Freq: Three times a day (TID) | INTRAMUSCULAR | Status: DC
  Administered 2013-07-10 – 2013-07-12 (×5): 5000 [IU] via SUBCUTANEOUS
  Filled 2013-07-10 (×8): qty 1

## 2013-07-10 MED ORDER — POLYETHYLENE GLYCOL 3350 17 G PO PACK
17.0000 g | PACK | Freq: Every day | ORAL | Status: DC
  Administered 2013-07-10 – 2013-07-12 (×3): 17 g via ORAL
  Filled 2013-07-10 (×3): qty 1

## 2013-07-10 MED ORDER — GABAPENTIN 300 MG PO CAPS
300.0000 mg | ORAL_CAPSULE | Freq: Every day | ORAL | Status: DC
  Administered 2013-07-11 – 2013-07-12 (×2): 300 mg via ORAL
  Filled 2013-07-10 (×2): qty 1

## 2013-07-10 NOTE — H&P (Signed)
[facility] Admission History and Physical Service Pager: [phone]  Patient name: Chris Payne Medical record number: [id number] Date of birth: 06-19-1950 Age: 63 y.o. Gender: male  Primary Care Provider: Ellsworth Lennox, MD Consultants: none Code Status: Full  Chief Complaint: weakness  Assessment and Plan: Chris Payne is a 63 y.o. male presenting with weakness. 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  # Healthcare Acquired Pneumonia: left basilar infiltrate on CXR. Recent hospitalization last week qualifying him as HCAP.  - started on vanc and zosyn in the ED  - if afebrile with normal WBC in am, consider switching to oral coverage like levaquin - follow up blood cultures (although obtained after antibiotics)  # Weakness: could be from pneumonia. No evidence of UTI on UA.  Ethanol normal. UDS positive for opiates which he takes. CT head without acute findings.  - consult PT for evaluation of weakness.  - troponin neg x1. Will repeat second one to rule out ACS as etiology - possible from dehydration since patient had poor po intake and some vomiting yesterday - IV fluids  # h/o CAD: recently admitted for unstable angina. Had Left heart cath which showed occlusion of the saphenous vein graft to the diagonal and to the acute marginal branch of the right coronary, patent LIMA to the LAD with diffuse disease of the LAD beyond the graft insertion site, Total occlusion of the right coronary, total occlusion of the mid LAD, and diffuse narrowing of the left main. No change in anatomy from prior cath.  Aggressive medical management was initiated with increase in imdur to 120mg , incerase of amlodipine and lipitor increase with initiation of ranexa. No new symptoms.  - resume medications  # COPD: chronic cough. Not on controller  medications. Currently not hypoxic or with evidence of exacerbation.  - continue to monitor. Consider nebs if needed   # HTN:  Continue norvasc, lopressor stable  # Chronic back pain:  -resume home gabapentin, morphine and vicodin  # h/o CVA:  - weakness not consistent with CVA, but will continue to monitor. IF weakness worsens, consider MRI for further evaluation.  FEN/GI: heart healthy diet Prophylaxis: heparin  Disposition: tele admit   History of Present Illness: Chris Payne is a 63 y.o. malepresenting with weakness. Patient reports that he woke up in the morning feeling generalized weakness with difficulty walking. He reports having pins and needles in his lower extremities which has been ongoing chronically.  He denies any worsening chest pain, any worsening shortness of breath. He has had a productive cough for 2 months. He has had tightening of his chest which is stable for him and improved from his symptoms of unstable angina that he had last week when he was admitted by cardiology.  He denies any diarrhea. He reports some dysuria that has been ongoing for a few days. He reports feeling of bloating. He has been constipated since Saturday. Review of ED note reports that he had multiple episodes of emesis yesterday and had not eaten or drank anything since yesterday am.   Review Of Systems: Per HPI with the following additions: none Otherwise 12 point review of systems was performed and was unremarkable.  Patient Active Problem List   Diagnosis Date Noted  . HCAP (healthcare-associated pneumonia) 07/10/2013  . Other and unspecified angina pectoris 07/04/2013  . Bilateral leg pain 10/31/2012  .  Nontoxic multinodular goiter 10/07/2012  . Unstable angina 06/19/2012  . Carotid stenosis   . PAD (peripheral artery disease)   . Left inguinal hernia 05/30/2012  . COPD (chronic obstructive pulmonary disease) 08/24/2011  . S/P AAA repair 08/24/2011  . Lumbar disc disease   .  Hypertensive heart disease   . Hyperlipidemia   . CAD (coronary artery disease)   . History of pontine CVA   . GERD (gastroesophageal reflux disease)    Past Medical History: Past Medical History  Diagnosis Date  . Hypertension   . Stroke     h/o pontine CVA  . AAA (abdominal aortic aneurysm)     s/p repair 6/11  . Hyperlipidemia   . CAD (coronary artery disease)     s/p CABG 2/12:  LIMA to LAD, SVG to diagonal-1, SVG to ramus intermedius,, SVG to AM (Dr. Roxan Hockey) ;  b.  Myoview 10/13:  inf infarct with very mild peri-infarct ischemia, EF 49%, inf HK; c. Cath 2/25 showed 2/4 occluded grafts & high grade stenosis in the LAD distal to the LIMA insertion, not amenable to PCI - Med Rx  . Carotid stenosis     a.  dopplers 1/88: LICA 41-66%;  b. Carotid dopplers 3/14:  R 0-39%, L 60-79%, repeat 6 mos  . PAD (peripheral artery disease)     ABIs 4/12:  R 0.88, L 0.92; R SFA 40%, L CFA 50%, L SFA 50-60%  . COPD (chronic obstructive pulmonary disease)   . Inguinal hernia   . Thyroid nodule     incidental finding on carotid doppler 3/14 => thyroid U/S ordered  . Myocardial infarction   . Anginal pain     occ last 1-2 months ago  . Shortness of breath   . Pneumonia     hx  . GERD (gastroesophageal reflux disease)    Past Surgical History: Past Surgical History  Procedure Laterality Date  . Hip surgery  40 years ago    left hip bone removal and pinning  . Abdominal aortic aneurysm repair  10-03-2009  . Exploratory laparotomy  09/2009    ligation of lumbar arteries  . Coronary artery bypass graft      LIMA to the LAD, SVG to first diagonal, SVG to ramus intermediate, SVG to acute marginal. EF 50%. 2/12  . External ear surgery Left     laceration child  . Inguinal hernia repair Left 07/21/2012    Procedure: HERNIA REPAIR INGUINAL ADULT;  Surgeon: Merrie Roof, MD;  Location: [place];  Service: General;  Laterality: Left;  . Insertion of mesh Left 07/21/2012    Procedure: INSERTION OF  MESH;  Surgeon: Merrie Roof, MD;  Location: [place];  Service: General;  Laterality: Left;   Social History: History  Substance Use Topics  . Smoking status: Former Smoker -- 1.00 packs/day for 30 years    Types: Cigarettes    Quit date: 01/25/2010  . Smokeless tobacco: Never Used  . Alcohol Use: No     Comment: former drinker   Additional social history: none Please also refer to relevant sections of EMR.  Family History: Family History  Problem Relation Age of Onset  . Alcohol abuse Father   . Cancer Sister    Allergies and Medications: No Known Allergies No current facility-administered medications on file prior to encounter.   Current Outpatient Prescriptions on File Prior to Encounter  Medication Sig Dispense Refill  . amLODipine (NORVASC) 5 MG tablet Take 1  tablet (5 mg total) by mouth daily.  30 tablet  5  . aspirin EC 81 MG tablet Take 81 mg by mouth daily.      Marland Kitchen atorvastatin (LIPITOR) 80 MG tablet Take 1 tablet (80 mg total) by mouth daily.  30 tablet  5  . HYDROcodone-acetaminophen (NORCO) 10-325 MG per tablet Take 2 tablets by mouth every 12 (twelve) hours as needed for moderate pain.       . isosorbide mononitrate (IMDUR) 120 MG 24 hr tablet Take 1 tablet (120 mg total) by mouth daily.  30 tablet  5  . metoprolol tartrate (LOPRESSOR) 25 MG tablet Take 0.5 tablets (12.5 mg total) by mouth 2 (two) times daily.  60 tablet  5  . nitroGLYCERIN (NITROSTAT) 0.4 MG SL tablet Place 0.4 mg under the tongue every 5 (five) minutes as needed for chest pain.       Marland Kitchen nystatin ointment (MYCOSTATIN) Apply 1 application topically daily as needed (for leg.). For leg area      . ranolazine (RANEXA) 1000 MG SR tablet Take 1 tablet (1,000 mg total) by mouth 2 (two) times daily.  60 tablet  5    Objective: BP 140/98  Pulse 60  Temp(Src) 99.2 F (37.3 C) (Rectal)  Resp 22  SpO2 92% Exam: General: no acute distress, sitting in bed, alert and oriented x4 HEENT: dry mucous  membranes, normal oropharynx, normal nasal turbinates Cardiovascular: S1S2, RRR, no murmur appreciated Respiratory: coarse breath sounds bilaterally with occasional scattered expiratory wheezing.  Abdomen: soft, mild discomfort with palpation of periumbilical area.  Extremities: 3+ pitting pretibial edema Skin: no rashes Neuro: CN2-12 grossly intact, normal speech, normal finger to nose 4+/5 strength with dorsiflexion, plantarflexion and knee extension bilaterally 5/5 grip strength bilaterally  Labs and Imaging: CBC BMET   Recent Labs Lab 07/10/13 0941  WBC 10.8*  HGB 17.1*  HCT 50.0  PLT 170    Recent Labs Lab 07/10/13 0941  NA 136*  K 4.3  CL 96  CO2 26  BUN 15  CREATININE 0.79  GLUCOSE 94  CALCIUM 9.3     EXAM:  CT HEAD WITHOUT CONTRAST  TECHNIQUE:  Contiguous axial images were obtained from the base of the skull  through the vertex without intravenous contrast.  COMPARISON: Head CT 10/10/2009.  FINDINGS:  Well-defined areas of low attenuation in the pons bilaterally are  compatible with old pontine infarcts. Cavum septum pellucidum  (normal anatomical variant) incidentally noted. Well-defined focus  of low attenuation in the right external capsule is compatible with  an old lacunar infarction. Patchy and confluent areas of decreased  attenuation are noted throughout the deep and periventricular white  matter of the cerebral hemispheres bilaterally, compatible with  chronic microvascular ischemic disease. No acute intracranial  abnormalities. Specifically, no evidence of acute intracranial  hemorrhage, no definite findings of acute/subacute cerebral  ischemia, no mass, mass effect, hydrocephalus or abnormal intra or  extra-axial fluid collections. Visualized paranasal sinuses and  mastoids are well pneumatized, with exception of some mild  multifocal mucosal thickening throughout the ethmoid sinuses  bilaterally. No acute displaced skull fractures are  identified.  IMPRESSION:  1. No acute intracranial abnormalities.  2. Old pontine infarcts bilaterally, similar prior studies.  3. Chronic microvascular ischemic changes in the cerebral white  matter and old lacunar infarct in the right external capsule.  4. Mild paranasal sinus disease, as above, without acute features.    Kandis Nab, MD 07/10/2013, 2:01 PM PGY-3, [place]  [place] Intern pager: [phone], text pages welcome

## 2013-07-10 NOTE — Evaluation (Signed)
Clinical/Bedside Swallow Evaluation Patient Details  Name: Chris Payne MRN: [id number] Date of Birth: 1950-08-08  Today's Date: 07/10/2013 Time: 4098-1191 SLP Time Calculation (min): 41 min  Past Medical History:  Past Medical History  Diagnosis Date  . Hypertension   . Stroke     h/o pontine CVA  . AAA (abdominal aortic aneurysm)     s/p repair 6/11  . Hyperlipidemia   . CAD (coronary artery disease)     s/p CABG 2/12:  LIMA to LAD, SVG to diagonal-1, SVG to ramus intermedius,, SVG to AM (Dr. Roxan Hockey) ;  b.  Myoview 10/13:  inf infarct with very mild peri-infarct ischemia, EF 49%, inf HK; c. Cath 2/25 showed 2/4 occluded grafts & high grade stenosis in the LAD distal to the LIMA insertion, not amenable to PCI - Med Rx  . Carotid stenosis     a.  dopplers 4/78: LICA 29-56%;  b. Carotid dopplers 3/14:  R 0-39%, L 60-79%, repeat 6 mos  . PAD (peripheral artery disease)     ABIs 4/12:  R 0.88, L 0.92; R SFA 40%, L CFA 50%, L SFA 50-60%  . COPD (chronic obstructive pulmonary disease)   . Inguinal hernia   . Thyroid nodule     incidental finding on carotid doppler 3/14 => thyroid U/S ordered  . Myocardial infarction   . Anginal pain     occ last 1-2 months ago  . Shortness of breath   . Pneumonia     hx  . GERD (gastroesophageal reflux disease)    Past Surgical History:  Past Surgical History  Procedure Laterality Date  . Hip surgery  40 years ago    left hip bone removal and pinning  . Abdominal aortic aneurysm repair  10-03-2009  . Exploratory laparotomy  09/2009    ligation of lumbar arteries  . Coronary artery bypass graft      LIMA to the LAD, SVG to first diagonal, SVG to ramus intermediate, SVG to acute marginal. EF 50%. 2/12  . External ear surgery Left     laceration child  . Inguinal hernia repair Left 07/21/2012    Procedure: HERNIA REPAIR INGUINAL ADULT;  Surgeon: Merrie Roof, MD;  Location: [place];  Service: General;  Laterality: Left;  . Insertion of  mesh Left 07/21/2012    Procedure: INSERTION OF MESH;  Surgeon: Merrie Roof, MD;  Location: [place];  Service: General;  Laterality: Left;   HPI:  63 y.o. Male presents complaining of inability to walk.  He states he has a history of low back problems and leg weakness.  He was at his baseline yesterday walking with his walker.  Today he states he was unable to stand with his walker.  He denies that pain is worse than usual, in fact, denying back pain now.  He states, after questioning, that he ate breakfast yesterday, but then had vomiting multiple times throughout the day with the last episode of emesis at 0130 today.  He did not have anything else to eat or drink after breakfast yesterday but took his medicines.  He does not think he had fever.  He describes the weakness as both legs without numbness or lateralized weakness   Assessment / Plan / Recommendation Clinical Impression  Patient exhibits an intermittent congested cough and frequent throat clearing after most consistencies presented, however, lung sounds were rhonchus prior to any po's.  Pt. reports he does choke easily at home with both food  and liquids.  Pt. has a history of dysphagia following a pontine stroke in 2011.  Will begin a conservative diet tonight, and complete a MBS 3/17.  MD, please order if you agree.    Aspiration Risk  Moderate    Diet Recommendation Dysphagia 2 (Fine chop);Nectar-thick liquid   Liquid Administration via: Cup;No straw Medication Administration: Whole meds with puree Supervision: Patient able to self feed;Full supervision/cueing for compensatory strategies Compensations: Slow rate;Small sips/bites Postural Changes and/or Swallow Maneuvers: Seated upright 90 degrees    Other  Recommendations Recommended Consults: MBS Oral Care Recommendations: Oral care BID Other Recommendations: Order thickener from pharmacy;Prohibited food (jello, ice cream, thin soups);Have oral suction available;Clarify dietary  restrictions   Follow Up Recommendations  24 hour supervision/assistance    Frequency and Duration min 2x/week  2 weeks   Pertinent Vitals/Pain CXR: Patchy left basilar infiiltrate; LS: bilateral rhonchi; Low grade fevers    SLP Swallow Goals  Pending MBS    Swallow Study Prior Functional Status       General HPI: 63 y.o. Male presents complaining of inability to walk.  He states he has a history of low back problems and leg weakness.  He was at his baseline yesterday walking with his walker.  Today he states he was unable to stand with his walker.  He denies that pain is worse than usual, in fact, denying back pain now.  He states, after questioning, that he ate breakfast yesterday, but then had vomiting multiple times throughout the day with the last episode of emesis at 0130 today.  He did not have anything else to eat or drink after breakfast yesterday but took his medicines.  He does not think he had fever.  He describes the weakness as both legs without numbness or lateralized weakness Type of Study: Bedside swallow evaluation Previous Swallow Assessment: Failed swallow screen; MBS 10/24/09 following Pontine CVA.  Pt. reports his swallow recovered and he was on a regular diet with thin liquids. Diet Prior to this Study: NPO Temperature Spikes Noted: Yes Respiratory Status: Nasal cannula History of Recent Intubation: No Behavior/Cognition: Alert;Cooperative;Pleasant mood Oral Cavity - Dentition: Missing dentition (No lower teeth or dentures) Self-Feeding Abilities: Able to feed self Patient Positioning: Upright in bed Baseline Vocal Quality: Clear Volitional Cough: Congested Volitional Swallow: Able to elicit    Oral/Motor/Sensory Function Overall Oral Motor/Sensory Function: Appears within functional limits for tasks assessed   Ice Chips Ice chips: Within functional limits Presentation: Spoon   Thin Liquid Thin Liquid: Impaired Presentation: Spoon;Cup Pharyngeal  Phase  Impairments: Suspected delayed Swallow;Cough - Delayed    Nectar Thick Nectar Thick Liquid: Impaired Presentation: Cup;Spoon Pharyngeal Phase Impairments: Suspected delayed Swallow;Throat Clearing - Delayed   Honey Thick Honey Thick Liquid: Not tested   Puree Puree: Impaired Presentation: Spoon;Self Fed Pharyngeal Phase Impairments: Suspected delayed Swallow;Multiple swallows;Throat Clearing - Delayed   Solid   GO    Solid: Not tested       Quinn Axe T 07/10/2013,5:25 PM

## 2013-07-10 NOTE — ED Notes (Signed)
Pt from home via [facility] with c/o of "pins and needles" bilateral leg pain x 4 months.  Pt has not been seen by his PCP for this, no change in this complain since onset.  Pt also has a congested, productive cough, was seen by PCP and not given anything.  Pt in NAD, A&O.

## 2013-07-10 NOTE — Progress Notes (Signed)
PT Cancellation Note  Patient Details Name: Chris Payne MRN: [id number] DOB: July 07, 1950   Cancelled Treatment:    Reason Eval/Treat Not Completed: Other (comment) (pt on bedrest and PT ordered at the same time).  PT cannot proceed with assessment until activity orders are updated and pt is off of bedrest.    Thanks,   Wells Guiles B. [place], [place], DPT [phone]   07/10/2013, 5:12 PM

## 2013-07-10 NOTE — ED Notes (Signed)
Patient at xray.  Family is not in the room at this time.

## 2013-07-10 NOTE — ED Notes (Signed)
Patient transported to X-ray 

## 2013-07-10 NOTE — Progress Notes (Signed)
ANTIBIOTIC CONSULT NOTE - INITIAL  Pharmacy Consult for vancomycin + zosyn Indication: rule out pneumonia  No Known Allergies  Patient Measurements: Height: 5' 6.93" (170 cm) Weight: 171 lb 11.8 oz (77.9 kg) IBW/kg (Calculated) : 65.94 Adjusted Body Weight:   Vital Signs: Temp: 98.5 F (36.9 C) (03/16 1528) Temp src: Oral (03/16 1528) BP: 140/78 mmHg (03/16 1528) Pulse Rate: 55 (03/16 1528) Intake/Output from previous day:   Intake/Output from this shift:    Labs:  Recent Labs  07/10/13 0941  WBC 10.8*  HGB 17.1*  PLT 170  CREATININE 0.79   Estimated Creatinine Clearance: 87 ml/min (by C-G formula based on Cr of 0.79). No results found for this basename: VANCOTROUGH, Corlis Leak, VANCORANDOM, [place], GENTPEAK, GENTRANDOM, TOBRATROUGH, TOBRAPEAK, TOBRARND, AMIKACINPEAK, AMIKACINTROU, AMIKACIN,  in the last 72 hours   Microbiology: Recent Results (from the past 720 hour(s))  MRSA PCR SCREENING     Status: None   Collection Time    07/03/13  9:52 PM      Result Value Ref Range Status   MRSA by PCR NEGATIVE  NEGATIVE Final   Comment:            The GeneXpert MRSA Assay (FDA     approved for NASAL specimens     only), is one component of a     comprehensive MRSA colonization     surveillance program. It is not     intended to diagnose MRSA     infection nor to guide or     monitor treatment for     MRSA infections.    Medical History: Past Medical History  Diagnosis Date  . Hypertension   . Stroke     h/o pontine CVA  . AAA (abdominal aortic aneurysm)     s/p repair 6/11  . Hyperlipidemia   . CAD (coronary artery disease)     s/p CABG 2/12:  LIMA to LAD, SVG to diagonal-1, SVG to ramus intermedius,, SVG to AM (Dr. Roxan Hockey) ;  b.  Myoview 10/13:  inf infarct with very mild peri-infarct ischemia, EF 49%, inf HK; c. Cath 2/25 showed 2/4 occluded grafts & high grade stenosis in the LAD distal to the LIMA insertion, not amenable to PCI - Med Rx  .  Carotid stenosis     a.  dopplers 3/15: LICA 40-08%;  b. Carotid dopplers 3/14:  R 0-39%, L 60-79%, repeat 6 mos  . PAD (peripheral artery disease)     ABIs 4/12:  R 0.88, L 0.92; R SFA 40%, L CFA 50%, L SFA 50-60%  . COPD (chronic obstructive pulmonary disease)   . Inguinal hernia   . Thyroid nodule     incidental finding on carotid doppler 3/14 => thyroid U/S ordered  . Myocardial infarction   . Anginal pain     occ last 1-2 months ago  . Shortness of breath   . Pneumonia     hx  . GERD (gastroesophageal reflux disease)     Medications:  Anti-infectives   Start     Dose/Rate Route Frequency Ordered Stop   07/10/13 1800  piperacillin-tazobactam (ZOSYN) IVPB 3.375 g     3.375 g 12.5 mL/hr over 240 Minutes Intravenous Every 8 hours 07/10/13 1706     07/10/13 1800  vancomycin (VANCOCIN) IVPB 750 mg/150 ml premix     750 mg 150 mL/hr over 60 Minutes Intravenous Every 8 hours 07/10/13 1706     07/10/13 1115  piperacillin-tazobactam (ZOSYN) IVPB 3.375 g  3.375 g 12.5 mL/hr over 240 Minutes Intravenous  Once 07/10/13 1102 07/10/13 1538   07/10/13 1115  vancomycin (VANCOCIN) injection 1 g  Status:  Discontinued     1 g Intravenous  Once 07/10/13 1102 07/10/13 1702     Assessment: 54 yom presented to the hospital with leg pain and cough. To start empiric vancomycin + zosyn for treatment of possible pneumonia. Pt is afebrile and WBC is 10.8. Pt has good renal fxn. He has already received his first dose of zosyn.   Vanc 3/16>> Zosyn 3/16>>  Goal of Therapy:  Vancomycin trough level 15-20 mcg/ml  Plan:  1. Zosyn 3.375gm IV Q8H (4 hr inf) 2. Vancomycin 750mg  IV Q8H 3. F/u renal fxn, C&S, clinical status and trough at [facility], Rande Lawman 07/10/2013,5:06 PM

## 2013-07-10 NOTE — ED Provider Notes (Signed)
CSN: [id number]     Arrival date & time 07/10/13  3151 History   First MD Initiated Contact with Patient 07/10/13 0915     Chief Complaint  Patient presents with  . Leg Pain  . Cough     (Consider location/radiation/quality/duration/timing/severity/associated sxs/prior Treatment) HPI 63 y.o. Male presents complaining of inability to walk.  He states he has a history of low back problems and leg weakness.  He was at his baseline yesterday walking with his walker.  Today he states he was unable to stand with his walker.  He denies that pain is worse than usual, in fact, denying back pain now.  He states, after questioning, that he ate breakfast yesterday, but then had vomiting multiple times throughout the day with the last episode of emesis at 0130 today.  He did not have anything else to eat or drink after breakfast yesterday but took his medicines.  He does not think he had fever.  He describes the weakness as both legs without numbness or lateralized weakness.   Past Medical History  Diagnosis Date  . Hypertension   . Stroke     h/o pontine CVA  . AAA (abdominal aortic aneurysm)     s/p repair 6/11  . Hyperlipidemia   . CAD (coronary artery disease)     s/p CABG 2/12:  LIMA to LAD, SVG to diagonal-1, SVG to ramus intermedius,, SVG to AM (Dr. Roxan Hockey) ;  b.  Myoview 10/13:  inf infarct with very mild peri-infarct ischemia, EF 49%, inf HK; c. Cath 2/25 showed 2/4 occluded grafts & high grade stenosis in the LAD distal to the LIMA insertion, not amenable to PCI - Med Rx  . Carotid stenosis     a.  dopplers 7/61: LICA 60-73%;  b. Carotid dopplers 3/14:  R 0-39%, L 60-79%, repeat 6 mos  . PAD (peripheral artery disease)     ABIs 4/12:  R 0.88, L 0.92; R SFA 40%, L CFA 50%, L SFA 50-60%  . COPD (chronic obstructive pulmonary disease)   . Inguinal hernia   . Thyroid nodule     incidental finding on carotid doppler 3/14 => thyroid U/S ordered  . Myocardial infarction   . Anginal pain      occ last 1-2 months ago  . Shortness of breath   . Pneumonia     hx  . GERD (gastroesophageal reflux disease)    Past Surgical History  Procedure Laterality Date  . Hip surgery  40 years ago    left hip bone removal and pinning  . Abdominal aortic aneurysm repair  10-03-2009  . Exploratory laparotomy  09/2009    ligation of lumbar arteries  . Coronary artery bypass graft      LIMA to the LAD, SVG to first diagonal, SVG to ramus intermediate, SVG to acute marginal. EF 50%. 2/12  . External ear surgery Left     laceration child  . Inguinal hernia repair Left 07/21/2012    Procedure: HERNIA REPAIR INGUINAL ADULT;  Surgeon: Merrie Roof, MD;  Location: [facility];  Service: General;  Laterality: Left;  . Insertion of mesh Left 07/21/2012    Procedure: INSERTION OF MESH;  Surgeon: Merrie Roof, MD;  Location: [facility];  Service: General;  Laterality: Left;   Family History  Problem Relation Age of Onset  . Alcohol abuse Father   . Cancer Sister    History  Substance Use Topics  . Smoking status: Former Smoker --  1.00 packs/day for 30 years    Types: Cigarettes    Quit date: 01/25/2010  . Smokeless tobacco: Never Used  . Alcohol Use: No     Comment: former drinker    Review of Systems  All other systems reviewed and are negative.      Allergies  Review of patient's allergies indicates no known allergies.  Home Medications   Current Outpatient Rx  Name  Route  Sig  Dispense  Refill  . amLODipine (NORVASC) 5 MG tablet   Oral   Take 1 tablet (5 mg total) by mouth daily.   30 tablet   5   . aspirin EC 81 MG tablet   Oral   Take 81 mg by mouth daily.         Marland Kitchen atorvastatin (LIPITOR) 80 MG tablet   Oral   Take 1 tablet (80 mg total) by mouth daily.   30 tablet   5   . gabapentin (NEURONTIN) 300 MG capsule   Oral   Take 300,600 mg by mouth 2 (two) times daily. 1 capsule (300mg ) in the morning and 2 capsules (600mg ) at bedtime         .  HYDROcodone-acetaminophen (NORCO) 10-325 MG per tablet   Oral   Take 2 tablets by mouth every 12 (twelve) hours as needed for moderate pain.          . isosorbide mononitrate (IMDUR) 120 MG 24 hr tablet   Oral   Take 1 tablet (120 mg total) by mouth daily.   30 tablet   5   . metoprolol tartrate (LOPRESSOR) 25 MG tablet   Oral   Take 0.5 tablets (12.5 mg total) by mouth 2 (two) times daily.   60 tablet   5   . morphine (MS CONTIN) 30 MG 12 hr tablet   Oral   Take 30 mg by mouth every 12 (twelve) hours.          . nitroGLYCERIN (NITROSTAT) 0.4 MG SL tablet   Sublingual   Place 0.4 mg under the tongue every 5 (five) minutes as needed for chest pain.          Marland Kitchen nystatin ointment (MYCOSTATIN)   Topical   Apply 1 application topically daily as needed (for leg.). For leg area         . ranolazine (RANEXA) 1000 MG SR tablet   Oral   Take 1 tablet (1,000 mg total) by mouth 2 (two) times daily.   60 tablet   5    BP 137/69  Pulse 60  Temp(Src) 98.1 F (36.7 C) (Oral)  Resp 19  SpO2 93% Physical Exam  Nursing note and vitals reviewed. Constitutional: He is oriented to person, place, and time. He appears well-developed and well-nourished.  HENT:  Head: Normocephalic and atraumatic.  Right Ear: Tympanic membrane and external ear normal.  Left Ear: Tympanic membrane and external ear normal.  Nose: Nose normal. Right sinus exhibits no maxillary sinus tenderness and no frontal sinus tenderness. Left sinus exhibits no maxillary sinus tenderness and no frontal sinus tenderness.  Mucous membranes dry.   Eyes: Conjunctivae and EOM are normal. Pupils are equal, round, and reactive to light. Right eye exhibits no nystagmus. Left eye exhibits no nystagmus.  Neck: Normal range of motion. Neck supple.  Cardiovascular: Normal rate, regular rhythm, normal heart sounds and intact distal pulses.   Pulmonary/Chest: Effort normal. No respiratory distress. He has wheezes. He exhibits no  tenderness.  Decreased breaths  sounds bilateral bases with scattered rhonchi and some expiratory wheezes.    Abdominal: Soft. Bowel sounds are normal. He exhibits no distension and no mass. There is no tenderness.  Musculoskeletal: Normal range of motion. He exhibits no edema and no tenderness.  Neurological: He is alert and oriented to person, place, and time. He has normal strength and normal reflexes. No sensory deficit. He displays a negative Romberg sign. GCS eye subscore is 4. GCS verbal subscore is 5. GCS motor subscore is 6.  Reflex Scores:      Tricep reflexes are 2+ on the right side and 2+ on the left side.      Bicep reflexes are 2+ on the right side and 2+ on the left side.      Brachioradialis reflexes are 2+ on the right side and 2+ on the left side.      Patellar reflexes are 2+ on the right side and 2+ on the left side.      Achilles reflexes are 2+ on the right side and 2+ on the left side. Speech is normal without dysarthria, dysphasia, or aphasia. Muscle strength is 5/5 in bilateral shoulders, elbow flexor and extensors, wrist flexor and extensors, and intrinsic hand muscles. 5/5 bilateral lower extremity hip flexors, extensors, knee flexors and extensors, and ankle dorsi and plantar flexors.  No sensory deficit noted bilateral lower extremities.   Skin: Skin is warm and dry. No rash noted.  Psychiatric: He has a normal mood and affect. His behavior is normal. Judgment and thought content normal.    ED Course  Procedures (including critical care time) Labs Review Labs Reviewed  ETHANOL  PROTIME-INR  APTT  CBC  DIFFERENTIAL  COMPREHENSIVE METABOLIC PANEL  URINE RAPID DRUG SCREEN (HOSP PERFORMED)  URINALYSIS, ROUTINE W REFLEX MICROSCOPIC  LACTIC ACID, PLASMA  I-STAT CHEM 8, ED  I-STAT TROPOININ, ED   Imaging Review Dg Chest 2 View (if Patient Has Fever And/or Copd)  07/10/2013   CLINICAL DATA:  Cough and congestion  EXAM: CHEST  2 VIEW  COMPARISON:  07/03/2013   FINDINGS: Cardiac shadow is stable. Postsurgical changes are again seen. Patchy infiltrate is noted in the left lung base.  IMPRESSION: Patchy left basilar infiltrate.   Electronically Signed   By: Inez Catalina M.D.   On: 07/10/2013 10:04     EKG Interpretation   Date/Time:  Monday July 10 2013 09:03:29 EDT Ventricular Rate:  60 PR Interval:  185 QRS Duration: 106 QT Interval:  452 QTC Calculation: 452 R Axis:     Text Interpretation:  Sinus rhythm Low voltage, precordial leads  Borderline T abnormalities, anterior leads Baseline wander in lead(s) II  III aVF V3 Confirmed by Stephnie Parlier MD, Andee Poles ([id number]) on 07/10/2013 9:12:35 AM      MDM   Final diagnoses:  None    63 year old male with multiple health problems recently admitted with unstable angina and heart catheterization. Today he presents with diffuse weakness specifically unable to ambulate. He does not have any focal deficits noted on his exam and workup reveals a left lower lobe infiltrate. Given his recent hospitalization this would be associated with health care and had treated with Zosyn and vancomycin. Patient care discussed with Dr. Otis Dials on call for [facility].  Patient's pmd is Dr. Leward Quan at [facility].  Patient to be admitted to telemetry bed.    Shaune Pollack, MD 07/10/13 [phone]

## 2013-07-11 ENCOUNTER — Inpatient Hospital Stay (HOSPITAL_COMMUNITY): Payer: Medicare HMO

## 2013-07-11 ENCOUNTER — Encounter (HOSPITAL_COMMUNITY): Payer: Self-pay | Admitting: General Practice

## 2013-07-11 DIAGNOSIS — I2510 Atherosclerotic heart disease of native coronary artery without angina pectoris: Secondary | ICD-10-CM

## 2013-07-11 DIAGNOSIS — J4489 Other specified chronic obstructive pulmonary disease: Secondary | ICD-10-CM

## 2013-07-11 DIAGNOSIS — J189 Pneumonia, unspecified organism: Principal | ICD-10-CM

## 2013-07-11 DIAGNOSIS — M79609 Pain in unspecified limb: Secondary | ICD-10-CM

## 2013-07-11 DIAGNOSIS — I739 Peripheral vascular disease, unspecified: Secondary | ICD-10-CM

## 2013-07-11 DIAGNOSIS — J449 Chronic obstructive pulmonary disease, unspecified: Secondary | ICD-10-CM

## 2013-07-11 DIAGNOSIS — R609 Edema, unspecified: Secondary | ICD-10-CM

## 2013-07-11 DIAGNOSIS — E785 Hyperlipidemia, unspecified: Secondary | ICD-10-CM

## 2013-07-11 LAB — CBC
HEMATOCRIT: 43.5 % (ref 39.0–52.0)
HEMOGLOBIN: 14.8 g/dL (ref 13.0–17.0)
MCH: 33.6 pg (ref 26.0–34.0)
MCHC: 34 g/dL (ref 30.0–36.0)
MCV: 98.9 fL (ref 78.0–100.0)
Platelets: 174 10*3/uL (ref 150–400)
RBC: 4.4 MIL/uL (ref 4.22–5.81)
RDW: 12.8 % (ref 11.5–15.5)
WBC: 9.3 10*3/uL (ref 4.0–10.5)

## 2013-07-11 LAB — BASIC METABOLIC PANEL
BUN: 12 mg/dL (ref 6–23)
CHLORIDE: 103 meq/L (ref 96–112)
CO2: 28 mEq/L (ref 19–32)
Calcium: 9 mg/dL (ref 8.4–10.5)
Creatinine, Ser: 0.86 mg/dL (ref 0.50–1.35)
GFR, EST NON AFRICAN AMERICAN: 90 mL/min — AB (ref >90)
Glucose, Bld: 93 mg/dL (ref 70–99)
POTASSIUM: 4 meq/L (ref 3.7–5.3)
SODIUM: 141 meq/L (ref 137–147)

## 2013-07-11 LAB — TSH: TSH: 0.287 u[IU]/mL — AB (ref 0.350–4.500)

## 2013-07-11 MED ORDER — PANTOPRAZOLE SODIUM 40 MG PO TBEC
40.0000 mg | DELAYED_RELEASE_TABLET | Freq: Every day | ORAL | Status: DC
  Administered 2013-07-11 – 2013-07-12 (×2): 40 mg via ORAL
  Filled 2013-07-11 (×2): qty 1

## 2013-07-11 MED ORDER — INFLUENZA VAC SPLIT QUAD 0.5 ML IM SUSP
0.5000 mL | INTRAMUSCULAR | Status: AC
Start: 2013-07-12 — End: 2013-07-12
  Administered 2013-07-12: 0.5 mL via INTRAMUSCULAR
  Filled 2013-07-11: qty 0.5

## 2013-07-11 MED ORDER — LEVOFLOXACIN 750 MG PO TABS
750.0000 mg | ORAL_TABLET | Freq: Every day | ORAL | Status: DC
  Administered 2013-07-11 – 2013-07-12 (×2): 750 mg via ORAL
  Filled 2013-07-11 (×2): qty 1

## 2013-07-11 MED ORDER — ASPIRIN EC 81 MG PO TBEC
81.0000 mg | DELAYED_RELEASE_TABLET | Freq: Every day | ORAL | Status: DC
  Administered 2013-07-12: 81 mg via ORAL
  Filled 2013-07-11: qty 1

## 2013-07-11 MED ORDER — ASPIRIN 81 MG PO CHEW: 81.0000 mg | CHEWABLE_TABLET | Freq: Every day | ORAL | Status: DC

## 2013-07-11 NOTE — Discharge Summary (Signed)
[facility] Discharge Summary  Patient name: Chris Payne Medical record number: [id number] Date of birth: Sep 17, 1950 Age: 63 y.o. Gender: male Date of Admission: 07/10/2013  Date of Discharge: 07/12/13 Admitting Physician: Lupita Dawn, MD  Primary Care Provider: Ellsworth Lennox, MD Consultants: none  Indication for Hospitalization: weakness   Discharge Diagnoses/Problem List:  Patient Active Problem List   Diagnosis Date Noted  . HCAP (healthcare-associated pneumonia) 07/10/2013  . Other and unspecified angina pectoris 07/04/2013  . Bilateral leg pain 10/31/2012  . Nontoxic multinodular goiter 10/07/2012  . Unstable angina 06/19/2012  . Carotid stenosis   . PAD (peripheral artery disease)   . Left inguinal hernia 05/30/2012  . COPD (chronic obstructive pulmonary disease) 08/24/2011  . S/P AAA repair 08/24/2011  . Lumbar disc disease   . Hypertensive heart disease   . Hyperlipidemia   . CAD (coronary artery disease)   . History of pontine CVA   . GERD (gastroesophageal reflux disease)     Disposition: home with home health  Discharge Condition: improved  Discharge Exam:  General: no acute distress, sitting in bed, alert and oriented  HEENT: [place]/AT, MMM  Cardiovascular: S1S2, RRR, no murmur appreciated  Respiratory: coarse breath sounds bilaterally with occasional scattered expiratory wheezing.  Abdomen: soft, +BS  Extremities: 3+ pitting to his ankle.  Skin: no rashes  Neuro: normal speech, limited range of motion of dorsiflexion and plantar flexion   Brief Hospital Course:  Chris Payne is a 63 y.o. male presenting with weakness. 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   # Healthcare Acquired Pneumonia: Patient presented with left basilar infiltrate on CXR with recent hospitalization.  He was started on  vanc and zosyn and transitioned to Levaquin.  He improved with breathing treatments and antibiotics. Blood cutlures showed no growth despite being collected after antibiotics were started.  He will need to complete the course of antibiotics.    #Lower extremity edema: Patient with 3+ pitting edema in b/l lower extremities. His most recent ECHO 10/05/12 55-60% with grade 1 diastolic dysfunction. Normal albumin and normal kidney function. Is on Norvasc 5 mg which was started with her admission 3/9-3/11 after cath for better BP control.  He is also on Gabapentin.  The edema is most likely related to venous insufficiency, medications and grade 1 diastolic dysfunction.  Would benefit from compression stockings and possibly starting some lasix.   # Weakness: Patient initially complaining of weakness on admission. This improved as he was being treated above. Evaluated by PT and recommended SNF. Patient declined placement and stated he had 24 hours supervision at home. Agreed to go home with home health PT.   # h/o CAD: No new symptoms. Recently admitted for unstable angina. Recent heart cath showing occlusion. He was continued on his home medications.   # COPD: He complains of a chronic cough. He is not on a controller medications. He was not hypoxic and without evidence of exacerbation.   # HTN: well controlled and continued on home medications.  - Continue norvasc, lopressor   # Chronic back pain: Stable and continued on home medications (gabapentin, morphine and vicodin).   # h/o CVA: weakness not consistent with CVA.   Issues for Follow Up:  1. Monitor edema. Would benefit from continued use of compression stockings. Consider adding lasix as well due to it being multifactorial causes.  2.  Completion of Antibiotics.   Significant Procedures: none  Significant Labs and Imaging:   Recent Labs Lab 07/10/13 0941 07/11/13 0400  WBC 10.8* 9.3  HGB 17.1* 14.8  HCT 50.0 43.5  PLT 170 174     Recent Labs Lab 07/10/13 0941 07/11/13 0400  NA 136* 141  K 4.3 4.0  CL 96 103  CO2 26 28  GLUCOSE 94 93  BUN 15 12  CREATININE 0.79 0.86  CALCIUM 9.3 9.0  ALKPHOS 80  --   AST 16  --   ALT 12  --   ALBUMIN 3.7  --       Recent Labs Lab 07/10/13 1821  TROPONINI <0.30   Alcohol Level    Component Value Date/Time   ETH <11 07/10/2013 0941    Drugs of Abuse     Component Value Date/Time   LABOPIA POSITIVE* 07/10/2013 1022   COCAINSCRNUR NONE DETECTED 07/10/2013 1022   LABBENZ NONE DETECTED 07/10/2013 1022   AMPHETMU NONE DETECTED 07/10/2013 1022   THCU NONE DETECTED 07/10/2013 1022   LABBARB NONE DETECTED 07/10/2013 1022    Urinalysis    Component Value Date/Time   COLORURINE YELLOW 07/10/2013 1022   APPEARANCEUR HAZY* 07/10/2013 1022   LABSPEC 1.020 07/10/2013 1022   PHURINE 7.5 07/10/2013 1022   GLUCOSEU NEGATIVE 07/10/2013 1022   HGBUR NEGATIVE 07/10/2013 1022   BILIRUBINUR NEGATIVE 07/10/2013 1022   KETONESUR NEGATIVE 07/10/2013 1022   PROTEINUR NEGATIVE 07/10/2013 1022   UROBILINOGEN 1.0 07/10/2013 1022   NITRITE NEGATIVE 07/10/2013 1022   LEUKOCYTESUR NEGATIVE 07/10/2013 1022   CT head  1. No acute intracranial abnormalities.  2. Old pontine infarcts bilaterally, similar prior studies.  3. Chronic microvascular ischemic changes in the cerebral white matter and old lacunar infarct in the right external capsule.  4. Mild paranasal sinus disease, as above, without acute features.   Results/Tests Pending at Time of Discharge: Blood cultures (obtained after ABX)   Discharge Medications:    Medication List         amLODipine 5 MG tablet  Commonly known as:  NORVASC  Take 1 tablet (5 mg total) by mouth daily.     aspirin EC 81 MG tablet  Take 81 mg by mouth daily.     atorvastatin 80 MG tablet  Commonly known as:  LIPITOR  Take 1 tablet (80 mg total) by mouth daily.     gabapentin 300 MG capsule  Commonly known as:  NEURONTIN  Take 300,600 mg by mouth  2 (two) times daily. 1 capsule (300mg ) in the morning and 2 capsules (600mg ) at bedtime     HYDROcodone-acetaminophen 10-325 MG per tablet  Commonly known as:  NORCO  Take 2 tablets by mouth every 12 (twelve) hours as needed for moderate pain.     isosorbide mononitrate 120 MG 24 hr tablet  Commonly known as:  IMDUR  Take 1 tablet (120 mg total) by mouth daily.     levofloxacin 750 MG tablet  Commonly known as:  LEVAQUIN  Take 1 tablet (750 mg total) by mouth daily.     metoprolol tartrate 25 MG tablet  Commonly known as:  LOPRESSOR  Take 0.5 tablets (12.5 mg total) by mouth 2 (two) times daily.     morphine 30 MG 12 hr tablet  Commonly known as:  MS CONTIN  Take 30 mg by mouth every 12 (twelve) hours.     nitroGLYCERIN 0.4 MG SL tablet  Commonly known as:  NITROSTAT  Place  0.4 mg under the tongue every 5 (five) minutes as needed for chest pain.     nystatin ointment  Commonly known as:  MYCOSTATIN  Apply 1 application topically daily as needed (for leg.). For leg area     ranolazine 1000 MG SR tablet  Commonly known as:  RANEXA  Take 1 tablet (1,000 mg total) by mouth 2 (two) times daily.        Discharge Instructions: Please refer to Patient Instructions section of EMR for full details.  Patient was counseled important signs and symptoms that should prompt return to medical care, changes in medications, dietary instructions, activity restrictions, and follow up appointments.   Follow-Up Appointments: Follow-up Information   Follow up with [facility]. (Physical Therapy Services)    Contact information:   [address] [place] [address] [phone]       Follow up with Ellsworth Lennox, MD On 07/21/2013. (2:15)    Specialty:  Family Medicine   Contact information:   [place] [address] [place], MD 07/16/2013, 9:54 PM PGY-1, [place]

## 2013-07-11 NOTE — Progress Notes (Signed)
FMTS Attending Note  I personally saw and evaluated the patient. The plan of care was discussed with the resident team. I agree with the assessment and plan as documented by the resident.   Patient reports improvement of symptoms, decreased cough, no sob, no chest pain, still has significant LE edema, he reports previous vein stripping/removal.  1. HCAP - improved, agree with transition to Levaquin today, monitor for 24 hours on oral antibiotics 2. LE edema - likely related to venous insufficiency and medications (Gabapentin/Norvasc), Grade I diastolic dysfunction likely contributing, will start on compression stockings, no plan for diuretic at this time however could consider as an outpatient 3. Weakness - PT recs SNF, patient refusing, will attempt to set up home health.   Dossie Arbour MD

## 2013-07-11 NOTE — Progress Notes (Signed)
UR Completed Tiaria Biby Graves-Bigelow, RN,BSN [phone]  

## 2013-07-11 NOTE — H&P (Signed)
FMTS Attending Note  I personally saw and evaluated the patient. The plan of care was discussed with the resident team. I agree with the assessment and plan as documented by the resident.   Patient was seen and evaluated on 07/10/13 at 55.   63 y/o male with PMH AAA repair, CVA, CAD s/p CABG, COPD, PAD presents with generalized weakness. Patient recently hospitalized for angina. Cardiac cath identified multivessel disease that was not amenable for stenting. Aggressive medical management was pursued. Please refer to resident note for HPI. Patient reports cough that has been present for months. No productive sputum. Also reports increased lower extremity edema over the past few day. Patient denies slurred speech, no focal weakness, no new numbness/tingling in extremities.   Vitals: reviewed Gen: pleasant male, NAD, normal speech HEENT: normocephalic, PERRL, EOMI, no scleral icterus, MMM, neck supple Cardiac: RRR, S1 and S2 present, no murmurs, no heaves/thrills Resp: coarse breath sounds bilaterally, good air entry in all lung fields, no increased work of breathing Abd: soft, no tenderness, normal bowel sounds Ext: 1+ LE edema, 2+ radial and DP pulses bilatearlly Skin: warm, no rash Neuro: CN 2-12 intact, sensation to light touch intact in all extremities, strength 5/5 in all extremities, pronator drift negative  Reviewed lab work and imaging studies.   Assessment and Plan: 63 y/o male recently discharged from [facility] for unstable angina/multivessel coronary disease presents with increased fatigue.  1. Weakness/Fatigue - suspect due to Healthcare Pneumonia (left lower lobe infiltrate on CXR), will treat with broad spectrum antibiotics, may also be related to dehydration from poor PO intake therefore will rehydrate with IVF, no focal neurologic findings to suggest new CVA (no plan for further workup at this time).  2. HCAP - treat with Vanco/Zosyn, monitor patient clinically 3. CAD - Multivessel  disease, continue aggressive medical management, agree with rule out ACS, at this time his cardiac disease is not likely contributing to his symptoms.  4. COPD - Albuterol as needed 5. HTN - continue home medications 6. Grade I Diastolic dysfunction - based on Echo from 09/2012, likely causing his LE edema, no evidence of anemia, Liver, or kidney disease based on lab work, home Gabapentin may also be contributing, consider compression stockings/low dose lasix if symptoms persist.   Dossie Arbour MD

## 2013-07-11 NOTE — Discharge Instructions (Signed)
Chris Payne, you were admitted for a hospital acquired pneumonia. We will discharge you on some antibiotics and you will need to complete them as prescribed. You also had weakness which should improve as your pneumonia resolves. You were also found to have swelling of your lower legs. The best way to keep this down is wear compression stockings.   Please follow up with your primary doctor as scheduled.    Pneumonia, Adult Pneumonia is an infection of the lungs. It may be caused by a germ (virus or bacteria). Some types of pneumonia can spread easily from person to person. This can happen when you cough or sneeze. HOME CARE  Only take medicine as told by your doctor.  Take your medicine (antibiotics) as told. Finish it even if you start to feel better.  Do not smoke.  You may use a vaporizer or humidifier in your room. This can help loosen thick spit (mucus).  Sleep so you are almost sitting up (semi-upright). This helps reduce coughing.  Rest. A shot (vaccine) can help prevent pneumonia. Shots are often advised for:  People over 64 years old.  Patients on chemotherapy.  People with long-term (chronic) lung problems.  People with immune system problems. GET HELP RIGHT AWAY IF:   You are getting worse.  You cannot control your cough, and you are losing sleep.  You cough up blood.  Your pain gets worse, even with medicine.  You have a fever.  Any of your problems are getting worse, not better.  You have shortness of breath or chest pain. MAKE SURE YOU:   Understand these instructions.  Will watch your condition.  Will get help right away if you are not doing well or get worse. Document Released: 09/30/2007 Document Revised: 07/06/2011 Document Reviewed: 07/04/2010 [facility] Patient Information 2014 [place].

## 2013-07-11 NOTE — Progress Notes (Signed)
Family Medicine Teaching Service Daily Progress Note Intern Pager: [phone]  Patient name: Chris Payne Medical record number: [id number] Date of birth: November 02, 1950 Age: 63 y.o. Gender: male  Primary Care Provider: Ellsworth Lennox, MD Consultants: none Code Status: full   Pt Overview and Major Events to Date:  3/16: admitted for weakness  ABX/Culture Chris Payne 3/16>> Zosyn 3/16>>  Blood cx (obtained after ABX) 3/16  Assessment and Plan: Chris Payne is a 63 y.o. male presenting with weakness. 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   # Healthcare Acquired Pneumonia: On room air with normal work of breathing. Left basilar infiltrate on CXR. Recent hospitalization last week qualifying him as HCAP.  - vanc and zosyn   - if afebrile with normal WBC in am, consider switching to oral coverage like levaquin  - follow up blood cultures   #Lower extremity edema: most likely related to venous insufficiency. ECHO 10/05/12 55-60% with grade 1 diastolic dysfunction. Normal albumin and normal kidney function. Is on Norvasc 5 mg which was started with her admission 3/9-3/11 after cath for better BP control.  - compression stockings b/l   # Weakness: Improving this AM. Could be from pneumonia. - consult PT.  - troponin neg x 2 - fluids as below   # h/o CAD: No new symptoms. Recently admitted for unstable angina. Recent heart cath showing occlusion.  - increase in imdur to 120mg , incerase of amlodipine and lipitor increase with initiation of ranexa.  - resume medications   # COPD: chronic cough. Not on controller medications. Currently not hypoxic or with evidence of exacerbation.  - continue to monitor. Consider nebs if needed   # HTN: well controlled.  - Continue norvasc, lopressor   # Chronic back pain: stable  -resume home gabapentin, morphine and vicodin   #  h/o CVA: weakness not consistent with CVA - will continue to monitor. IF weakness worsens, consider MRI for further evaluation.   FEN/GI: heart healthy diet/ NS 125 mL/hr  Prophylaxis: heparin  Disposition: pending improvement   Subjective: patient feeling well this AM. His weakness is improved. He slept well last night.   Objective: Temp:  [98.1 F (36.7 C)-99.2 F (37.3 C)] 98.1 F (36.7 C) (03/17 0620) Pulse Rate:  [52-61] 52 (03/17 0620) Resp:  [14-22] 18 (03/17 0620) BP: (109-142)/(63-98) 109/65 mmHg (03/17 0620) SpO2:  [90 %-99 %] 92 % (03/17 0620) Weight:  [171 lb 11.8 oz (77.9 kg)] 171 lb 11.8 oz (77.9 kg) (03/16 1700) Physical Exam: General: no acute distress, sitting in bed, alert and oriented  HEENT: /AT, MMM Cardiovascular: S1S2, RRR, no murmur appreciated  Respiratory: coarse breath sounds bilaterally with occasional scattered expiratory wheezing.  Abdomen: soft, +BS  Extremities: 3+ pitting to his ankle.  Skin: no rashes  Neuro: CN2-12 grossly intact, normal speech, limited range of motion of dorsiflexion and plantar flexion    Laboratory:  Recent Labs Lab 07/05/13 0439 07/10/13 0941 07/11/13 0400  WBC 7.1 10.8* 9.3  HGB 14.9 17.1* 14.8  HCT 44.2 50.0 43.5  PLT 174 170 174    Recent Labs Lab 07/10/13 0941 07/11/13 0400  NA 136* 141  K 4.3 4.0  CL 96 103  CO2 26 28  BUN 15 12  CREATININE 0.79 0.86  CALCIUM 9.3 9.0  PROT 7.4  --   BILITOT 0.7  --   ALKPHOS 80  --  ALT 12  --   AST 16  --   GLUCOSE 94 93     Recent Labs Lab 07/04/13 0948 07/10/13 1821  TROPONINI <0.30 <0.30   Alcohol Level    Component Value Date/Time   ETH <11 07/10/2013 0941    Drugs of Abuse     Component Value Date/Time   LABOPIA POSITIVE* 07/10/2013 1022   COCAINSCRNUR NONE DETECTED 07/10/2013 1022   LABBENZ NONE DETECTED 07/10/2013 1022   AMPHETMU NONE DETECTED 07/10/2013 1022   THCU NONE DETECTED 07/10/2013 1022   LABBARB NONE DETECTED 07/10/2013 1022     Urinalysis    Component Value Date/Time   COLORURINE YELLOW 07/10/2013 1022   APPEARANCEUR HAZY* 07/10/2013 1022   LABSPEC 1.020 07/10/2013 1022   PHURINE 7.5 07/10/2013 1022   GLUCOSEU NEGATIVE 07/10/2013 1022   HGBUR NEGATIVE 07/10/2013 1022   BILIRUBINUR NEGATIVE 07/10/2013 1022   KETONESUR NEGATIVE 07/10/2013 1022   PROTEINUR NEGATIVE 07/10/2013 1022   UROBILINOGEN 1.0 07/10/2013 1022   NITRITE NEGATIVE 07/10/2013 1022   LEUKOCYTESUR NEGATIVE 07/10/2013 1022    Imaging/Diagnostic Tests: CT head  1. No acute intracranial abnormalities.  2. Old pontine infarcts bilaterally, similar prior studies.  3. Chronic microvascular ischemic changes in the cerebral white matter and old lacunar infarct in the right external capsule.  4. Mild paranasal sinus disease, as above, without acute features.   Rosemarie Ax, MD 07/11/2013, 8:15 AM PGY-1, [place] Intern pager: [phone], text pages welcome

## 2013-07-11 NOTE — Procedures (Signed)
Objective Swallowing Evaluation: Modified Barium Swallowing Study  Patient Details  Name: Chris Payne MRN: [id number] Date of Birth: [birth year]  Today's Date: 07/11/2013 Time: 1105-1120 SLP Time Calculation (min): 15 min  Past Medical History:  Past Medical History  Diagnosis Date  . Hypertension   . Stroke     h/o pontine CVA  . AAA (abdominal aortic aneurysm)     s/p repair 6/11  . Hyperlipidemia   . CAD (coronary artery disease)     s/p CABG 2/12:  LIMA to LAD, SVG to diagonal-1, SVG to ramus intermedius,, SVG to AM (Dr. Roxan Hockey) ;  b.  Myoview 10/13:  inf infarct with very mild peri-infarct ischemia, EF 49%, inf HK; c. Cath 2/25 showed 2/4 occluded grafts & high grade stenosis in the LAD distal to the LIMA insertion, not amenable to PCI - Med Rx  . Carotid stenosis     a.  dopplers 0/08: LICA 67-61%;  b. Carotid dopplers 3/14:  R 0-39%, L 60-79%, repeat 6 mos  . PAD (peripheral artery disease)     ABIs 4/12:  R 0.88, L 0.92; R SFA 40%, L CFA 50%, L SFA 50-60%  . COPD (chronic obstructive pulmonary disease)   . Inguinal hernia   . Thyroid nodule     incidental finding on carotid doppler 3/14 => thyroid U/S ordered  . Myocardial infarction   . Anginal pain     occ last 1-2 months ago  . Shortness of breath   . Pneumonia     hx  . GERD (gastroesophageal reflux disease)    Past Surgical History:  Past Surgical History  Procedure Laterality Date  . Hip surgery  40 years ago    left hip bone removal and pinning  . Abdominal aortic aneurysm repair  10-03-2009  . Exploratory laparotomy  09/2009    ligation of lumbar arteries  . Coronary artery bypass graft      LIMA to the LAD, SVG to first diagonal, SVG to ramus intermediate, SVG to acute marginal. EF 50%. 2/12  . External ear surgery Left     laceration child  . Inguinal hernia repair Left 07/21/2012    Procedure: HERNIA REPAIR INGUINAL ADULT;  Surgeon: Merrie Roof, MD;  Location: [facility];  Service: General;   Laterality: Left;  . Insertion of mesh Left 07/21/2012    Procedure: INSERTION OF MESH;  Surgeon: Merrie Roof, MD;  Location: [facility];  Service: General;  Laterality: Left;   HPI:  63 y.o. Male presents complaining of inability to walk.  He states he has a history of low back problems and leg weakness.  He was at his baseline yesterday walking with his walker.  Today he states he was unable to stand with his walker.  He denies that pain is worse than usual, in fact, denying back pain now.  He states, after questioning, that he ate breakfast yesterday, but then had vomiting multiple times throughout the day with the last episode of emesis at 0130 today.  He did not have anything else to eat or drink after breakfast yesterday but took his medicines.  He does not think he had fever.  He describes the weakness as both legs without numbness or lateralized weakness.  Bedside swallow evaluation 3/16 revealed concerns for proper airway protection with eating.  MBS recommended,.     Assessment / Plan / Recommendation Clinical Impression  Dysphagia Diagnosis: Within Functional Limits;Mild cervical esophageal phase dysphagia Clinical impression: Pt  presents with functional oropharyngeal swallow with no penetration/aspiration despite large, successive liquid and solid boluses.  Presence of osteophytes noted along distal cervical spine - MD not present to confirm.  Screening of esophagus notable for slowed emptying of solids/liquids.  Pt reports frequent regurgitation of POs during mealtime.  This symtoms was not observed today.  Recommend resuming a regular consistency diet with thin liquids.  No SLP f/u warranted.   Consider esophageal w/u if symptoms necessitate it.     Treatment Recommendation  No treatment recommended at this time    Diet Recommendation Regular;Thin liquid   Liquid Administration via: Cup;Straw Medication Administration: Crushed with puree Supervision: Patient able to self  feed Postural Changes and/or Swallow Maneuvers: Seated upright 90 degrees    Other  Recommendations Oral Care Recommendations: Oral care BID             SLP Swallow Goals     General Type of Study: Modified Barium Swallowing Study Reason for Referral: Objectively evaluate swallowing function Previous Swallow Assessment: Failed swallow screen; MBS 10/24/09 following Pontine CVA.  Pt. reports his swallow recovered and he was on a regular diet with thin liquids. Diet Prior to this Study: Dysphagia 2 (chopped);Nectar-thick liquids Temperature Spikes Noted: Yes Respiratory Status: Nasal cannula History of Recent Intubation: No Behavior/Cognition: Alert;Cooperative;Pleasant mood Oral Cavity - Dentition: Missing dentition Oral Motor / Sensory Function: Within functional limits Self-Feeding Abilities: Able to feed self Patient Positioning: Upright in chair Baseline Vocal Quality: Clear Volitional Cough: Congested Volitional Swallow: Able to elicit Anatomy: Other (Comment) (appearance of large osteophytes cervical vertebra, no MD pre) Pharyngeal Secretions: Not observed secondary MBS    Reason for Referral Objectively evaluate swallowing function   Oral Phase Oral Preparation/Oral Phase Oral Phase:  (prolonged mastication, but functional)   Pharyngeal Phase Pharyngeal Phase Pharyngeal Phase: Within functional limits  Cervical Esophageal Phase       Jabar Krysiak L. [place], [place] CCC/SLP Pager [phone]  Cervical Esophageal Phase Cervical Esophageal Phase: Impaired Cervical Esophageal Phase - Solids Puree: Prominent cricopharyngeal segment Cervical Esophageal Phase - Comment Cervical Esophageal Comment: per screen, slowed passage of barium through esophagus         Juan Quam Laurice 07/11/2013, 11:29 AM

## 2013-07-11 NOTE — Progress Notes (Signed)
I spoke with patient and he declined SNF placement at this time. He would like to go home and he has 24 hour supervision at home.  He was agreeable to home health PT.  I spoke with the PT supervisor and home health PT would be ok. I put in for home health PT and care management.   Rosemarie Ax, MD PGY-1, [place] [facility] 07/11/2013, 4:19 PM [place] Service pager: [phone] (text pages welcome through [facility])

## 2013-07-11 NOTE — Evaluation (Signed)
Physical Therapy Evaluation Patient Details Name: Chris Payne MRN: [id number] DOB: 13-Jun-1950 Today's Date: 07/11/2013 Time: 0998-3382 PT Time Calculation (min): 21 min  PT Assessment / Plan / Recommendation History of Present Illness  63 y/o male with PMH AAA repair, CVA, CAD s/p CABG, COPD, PAD presents with generalized weakness.  Diagnosed with HCAP.  Clinical Impression  Pt admitted with HCAP. Pt currently with functional limitations due to the deficits listed below (see PT Problem List). Pt. With significant balance deficits and posterior LOB in standing requiring mod A to control descent to recliner. Pt will benefit from skilled PT to increase their independence and safety with mobility to allow discharge to the venue listed below. Recommend SNF for safe d/c.    PT Assessment  Patient needs continued PT services    Follow Up Recommendations  SNF;Supervision/Assistance - 24 hour    Does the patient have the potential to tolerate intense rehabilitation      Barriers to Discharge Decreased caregiver support unsure if family able to provide necessary level of support    Equipment Recommendations  None recommended by PT    Recommendations for Other Services OT consult   Frequency Min 3X/week    Precautions / Restrictions Precautions Precautions: Fall Restrictions Weight Bearing Restrictions: No   Pertinent Vitals/Pain No c/o      Mobility  Bed Mobility Overal bed mobility: Needs Assistance Bed Mobility: Supine to Sit Supine to sit: Min assist;HOB elevated General bed mobility comments: min A needed to bring trunk off bed and to maintain postural control Transfers Overall transfer level: Needs assistance Equipment used: Rolling walker (2 wheeled) Transfers: Sit to/from [facility] Sit to Stand: Min assist Stand pivot transfers: Mod assist General transfer comment: pt. with decreased weight bearing on LLE. LOB posteriorly with pivot transfer  causing uncontrolled descent to recliner.  Mod A to control descent.    Exercises     PT Diagnosis: Difficulty walking;Abnormality of gait;Generalized weakness  PT Problem List: Decreased strength;Decreased range of motion;Decreased activity tolerance;Decreased balance;Decreased coordination;Decreased mobility;Decreased knowledge of use of DME;Decreased safety awareness;Decreased knowledge of precautions PT Treatment Interventions: DME instruction;Gait training;Functional mobility training;Therapeutic exercise;Therapeutic activities;Balance training;Neuromuscular re-education;Patient/family education     PT Goals(Current goals can be found in the care plan section) Acute Rehab PT Goals Patient Stated Goal: to return home; stay out of hospital PT Goal Formulation: With patient Time For Goal Achievement: 07/18/13 Potential to Achieve Goals: Fair  Visit Information  Last PT Received On: 07/11/13 Assistance Needed: +1 ((+2 for amb)) History of Present Illness: 63 y/o male with PMH AAA repair, CVA, CAD s/p CABG, COPD, PAD presents with generalized weakness.  Diagnosed with HCAP.       Prior [place] expects to be discharged to:: Private residence Living Arrangements: Spouse/significant other;Children (step son) Available Help at Discharge: Family;Available 24 hours/day Type of Home: Mobile home Home Access: Ramped entrance Home Layout: One level Home Equipment: [place] - single point;Wheelchair - [employer] - 4 wheels Prior Function Level of Independence: Independent with assistive device(s) Comments: amb with rollator RW Communication Communication: No difficulties Dominant Hand: Right    Cognition  Cognition Arousal/Alertness: Awake/alert Behavior During Therapy: WFL for tasks assessed/performed Overall Cognitive Status: Within Functional Limits for tasks assessed    Extremity/Trunk Assessment Upper Extremity Assessment Upper Extremity  Assessment: Generalized weakness Lower Extremity Assessment Lower Extremity Assessment: Generalized weakness (L > R) Cervical / Trunk Assessment Cervical / Trunk Assessment: Normal   Balance Balance Overall balance assessment:  Needs assistance;History of Falls Sitting-balance support: Bilateral upper extremity supported;Feet supported Sitting balance-Leahy Scale: Poor Sitting balance - Comments: posterior lean needing min A to correct Postural control: Posterior lean Standing balance support: During functional activity;Bilateral upper extremity supported Standing balance-Leahy Scale: Zero Standing balance comment: LOB with pivot to recliner General Comments General comments (skin integrity, edema, etc.): L foot with increased edema; decreased ROM L ankle.  Pt. reports L hip injury "about 30 years ago" and hasn't been able to fully weight bear since injury.  Decreased dorsiflexion ROM L ankle.  End of Session PT - End of Session Equipment Utilized During Treatment: Gait belt Activity Tolerance: Patient limited by fatigue;Patient tolerated treatment well Patient left: in chair;with call bell/phone within reach Nurse Communication: Mobility status  GP     Faustino Congress K 07/11/2013, 9:33 AM  Laureen Abrahams, PT, DPT [phone]

## 2013-07-12 MED ORDER — LEVOFLOXACIN 750 MG PO TABS: 750.0000 mg | ORAL_TABLET | Freq: Every day | ORAL | Status: DC

## 2013-07-12 NOTE — Progress Notes (Signed)
DC orders received.  Patient stable with no S/S of distress.  Medication and discharge information reviewed with patient.  Patient awaiting DC home with son. [place], Ardeth Sportsman

## 2013-07-12 NOTE — Progress Notes (Signed)
Family Medicine Teaching Service Daily Progress Note Intern Pager: [phone]  Patient name: Chris Payne Medical record number: [id number] Date of birth: 1950/10/16 Age: 63 y.o. Gender: male  Primary Care Provider: Ellsworth Lennox, MD Consultants: none Code Status: full   Pt Overview and Major Events to Date:  3/16: admitted for weakness  ABX/Culture Lucianne Lei 3/16>>3/17 Zosyn 3/16>>3/17 Levaquin 3/17>>  Blood cx (obtained after ABX) 3/16>>NGTD  Assessment and Plan: Chris Payne is a 63 y.o. male presenting with weakness. 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   # Healthcare Acquired Pneumonia: On room air with normal work of breathing. Left basilar infiltrate on CXR. Recent hospitalization last week qualifying him as HCAP.  - started levaquin  - follow up blood cultures   #Lower extremity edema: multifactorial in nature. Patient with history of PAD, heart failure and probable venous insufficiency. Is on Norvasc 5 mg which was started with her admission 3/9-3/11 after cath for better BP control.  - compression stockings b/l   # Weakness: Improving this AM. Could be from pneumonia. - consult PT.  - troponin neg x 2 - fluids as below   # h/o CAD: No new symptoms. Recently admitted for unstable angina. Recent heart cath showing occlusion.  - increase in imdur to 120mg , incerase of amlodipine and lipitor increase with initiation of ranexa.  - resume medications   # COPD: chronic cough. Not on controller medications. Currently not hypoxic or with evidence of exacerbation.  - continue to monitor. Consider nebs if needed   # HTN: well controlled.  - Continue norvasc, lopressor   # Chronic back pain: stable  -resume home gabapentin, morphine and vicodin   # h/o CVA: weakness not consistent with CVA - will continue to monitor. IF weakness worsens,  consider MRI for further evaluation.   FEN/GI: heart healthy diet/ NS 125 mL/hr  Prophylaxis: heparin  Disposition: pending improvement   Subjective: Patient is feeling better this AM. Patient is feeling back to normal.   Objective: Temp:  [97.8 F (36.6 C)-98.8 F (37.1 C)] 97.8 F (36.6 C) (03/18 0500) Pulse Rate:  [52-54] 54 (03/18 0500) Resp:  [18] 18 (03/17 2100) BP: (118-123)/(73-77) 118/73 mmHg (03/18 0500) SpO2:  [92 %-93 %] 93 % (03/17 2100) Physical Exam: General: no acute distress, sitting in bed, alert and oriented  HEENT: [place]/AT, MMM Cardiovascular: S1S2, RRR, no murmur appreciated  Respiratory: coarse breath sounds bilaterally with occasional scattered expiratory wheezing.  Abdomen: soft, +BS  Extremities: 3+ pitting to his ankle.  Skin: no rashes  Neuro: normal speech, limited range of motion of dorsiflexion and plantar flexion    Laboratory:  Recent Labs Lab 07/10/13 0941 07/11/13 0400  WBC 10.8* 9.3  HGB 17.1* 14.8  HCT 50.0 43.5  PLT 170 174    Recent Labs Lab 07/10/13 0941 07/11/13 0400  NA 136* 141  K 4.3 4.0  CL 96 103  CO2 26 28  BUN 15 12  CREATININE 0.79 0.86  CALCIUM 9.3 9.0  PROT 7.4  --   BILITOT 0.7  --   ALKPHOS 80  --   ALT 12  --   AST 16  --   GLUCOSE 94 93      Recent Labs Lab 07/10/13 1821  TROPONINI <0.30    Alcohol Level    Component Value Date/Time   [facility] <11 07/10/2013 0941    Drugs  of Abuse     Component Value Date/Time   LABOPIA POSITIVE* 07/10/2013 1022   COCAINSCRNUR NONE DETECTED 07/10/2013 1022   LABBENZ NONE DETECTED 07/10/2013 1022   AMPHETMU NONE DETECTED 07/10/2013 1022   THCU NONE DETECTED 07/10/2013 1022   LABBARB NONE DETECTED 07/10/2013 1022    Urinalysis    Component Value Date/Time   COLORURINE YELLOW 07/10/2013 1022   APPEARANCEUR HAZY* 07/10/2013 1022   LABSPEC 1.020 07/10/2013 1022   PHURINE 7.5 07/10/2013 1022   GLUCOSEU NEGATIVE 07/10/2013 1022   HGBUR NEGATIVE 07/10/2013 1022    BILIRUBINUR NEGATIVE 07/10/2013 1022   [place] 07/10/2013 1022   PROTEINUR NEGATIVE 07/10/2013 1022   UROBILINOGEN 1.0 07/10/2013 1022   NITRITE NEGATIVE 07/10/2013 1022   LEUKOCYTESUR NEGATIVE 07/10/2013 1022    Imaging/Diagnostic Tests: CT head  1. No acute intracranial abnormalities.  2. Old pontine infarcts bilaterally, similar prior studies.  3. Chronic microvascular ischemic changes in the cerebral white matter and old lacunar infarct in the right external capsule.  4. Mild paranasal sinus disease, as above, without acute features.   Rosemarie Ax, MD 07/12/2013, 8:44 AM PGY-1, [place] Intern pager: [phone], text pages welcome

## 2013-07-12 NOTE — Progress Notes (Signed)
FMTS Attending Note  I personally saw and evaluated the patient. The plan of care was discussed with the resident team. I agree with the assessment and plan as documented by the resident.   Dyspnea continues to improve. He reports decreased cough as well. No fevers or chills. Wearing compression stockings, will provided prescription for compression stockings at time of discharge.  Patient stable for discharge home with home health.   Chris Arbour MD

## 2013-07-12 NOTE — Care Management Note (Signed)
    Page 1 of 1   07/12/2013     12:17:10 PM   CARE MANAGEMENT NOTE 07/12/2013  Patient:  Chris Payne, Chris Payne   Account Number:  [id number]  Date Initiated:  07/12/2013  Documentation initiated by:  GRAVES-BIGELOW,Amariyon Maynes  Subjective/Objective Assessment:   Pt admitted for HCAP. Pt  is from home with wife and son.     Action/Plan:   Per PT recommendations SNF and pt is refusing. CM did speak to wife and she is agreeable to [facility] with 24 hr supervision with [facility]. CM did make referral with [facility]. SOC to begin within 24-48 hrs.   Anticipated DC Date:  07/12/2013   Anticipated DC Plan:  [place]  CM consult      [facility] Choice  HOME HEALTH   Choice offered to / List presented to:  C-1 Patient        [place] arranged  [place] PT      [place].   Status of service:  Completed, signed off Medicare Important Message given?   (If response is "NO", the following Medicare IM given date fields will be blank) Date Medicare IM given:   Date Additional Medicare IM given:    Discharge Disposition:  [place]  Per UR Regulation:  Reviewed for med. necessity/level of care/duration of stay  If discussed at [place] of Stay Meetings, dates discussed:    Comments:

## 2013-07-17 LAB — CULTURE, BLOOD (ROUTINE X 2)
CULTURE: NO GROWTH
Culture: NO GROWTH

## 2013-07-17 NOTE — Discharge Summary (Signed)
I agree with the discharge summary as documented.   Dayvion Sans MD  

## 2013-07-19 ENCOUNTER — Ambulatory Visit (HOSPITAL_COMMUNITY): Payer: Medicare HMO | Attending: Cardiology | Admitting: Cardiology

## 2013-07-19 DIAGNOSIS — I6529 Occlusion and stenosis of unspecified carotid artery: Secondary | ICD-10-CM

## 2013-07-19 NOTE — Progress Notes (Signed)
Carotid duplex complete 

## 2013-07-20 ENCOUNTER — Ambulatory Visit (INDEPENDENT_AMBULATORY_CARE_PROVIDER_SITE_OTHER): Payer: Commercial Managed Care - HMO | Admitting: Physician Assistant

## 2013-07-20 ENCOUNTER — Encounter: Payer: Self-pay | Admitting: Physician Assistant

## 2013-07-20 VITALS — BP 100/60 | HR 49 | Ht 66.95 in | Wt 173.1 lb

## 2013-07-20 DIAGNOSIS — I119 Hypertensive heart disease without heart failure: Secondary | ICD-10-CM

## 2013-07-20 DIAGNOSIS — I77810 Thoracic aortic ectasia: Secondary | ICD-10-CM | POA: Diagnosis not present

## 2013-07-20 DIAGNOSIS — I714 Abdominal aortic aneurysm, without rupture, unspecified: Secondary | ICD-10-CM

## 2013-07-20 DIAGNOSIS — E785 Hyperlipidemia, unspecified: Secondary | ICD-10-CM

## 2013-07-20 DIAGNOSIS — I2510 Atherosclerotic heart disease of native coronary artery without angina pectoris: Secondary | ICD-10-CM

## 2013-07-20 DIAGNOSIS — I6529 Occlusion and stenosis of unspecified carotid artery: Secondary | ICD-10-CM

## 2013-07-20 MED ORDER — AMLODIPINE BESYLATE 2.5 MG PO TABS
2.5000 mg | ORAL_TABLET | Freq: Every day | ORAL | Status: DC
Start: 2013-07-20 — End: 2013-09-22

## 2013-07-20 NOTE — Patient Instructions (Addendum)
DECREASE AMLODIPINE TO 2.5 MG DAILY; NEW RX SENT IN TO [facility] FOR THE 2.5 MG TABLET  GET SOME OTC COMPRESSION STOCKINGS AND WEAR DAILY  WHEN SITTING DOWN MAKE SURE TO ELEVATE YOUR LEGS   YOU WILL NEED TO FOLLOW UP WITH DR. [place] ON 09/15/13 @ 8:45  SAME DAY YOU SEE DR. HOCHREIN YOU WILL NEED FASTING LIPID AND LIVER PANEL TO BE DONE  AT YOUR APPT TOMORROW 07/21/13 WITH DR. Leward Quan HAVE HER GET LAB WORK (BMET) FOR SCOTT WEAVER, PAC

## 2013-07-20 NOTE — Progress Notes (Signed)
[place], [place], [place]  [address] Phone: [phone] Fax:  [phone]  Date:  07/20/2013   ID:  Chris Payne, DOB 22-Aug-1950, MRN [id number]  PCP:  Ellsworth Lennox, MD  Cardiologist:  Dr. Minus Breeding     History of Present Illness: Chris Payne is a 63 y.o. male with a hx of CAD s/p CABG in 05/2010, AAA s/p repair 6/11, prior stroke, carotid stenosis, PAD, HTN, HL, COPD.    Patient was recently admitted 3/9-3/11 with chest pain consistent with unstable angina. CEs were normal. He underwent LHC that demonstrated an occluded vein graft to the diagonal and an occluded vein graft to the acute marginal of the RCA. LIMA-LAD remained patent with distal disease beyond graft insertion. SVG-RI was also patent. His anatomy was unchanged from prior catheterization in 05/2012. Continued medical therapy was recommended. His Imdur was increased. Amlodipine and Ranexa were both added to his medical regimen. Statin therapy was increased to Lipitor 80. Patient was readmitted 3/16-3/18. He presented with weakness and was found to have a left basilar infiltrate on chest x-ray. He was treated for HCAP. Patient was noted to have LE edema felt to be related to amlodipine, diastolic CHF and venous insufficiency. He was evaluated by physical therapy. Skilled nursing was recommended but he declined and was set up for home health PT.  He is doing well. He denies any further chest pain. He denies significant dyspnea. He denies orthopnea, PND. He denies syncope, dizziness or near syncope. He does report LE edema. This is chronic and without significant change. He has a continued cough with yellow sputum. He denies fevers or hemoptysis. He sees his PCP tomorrow.   Studies:  - LHC (07/03/13):  SVG-Dx occluded, SVG-AM occluded, LIMA-LAD patent with diffuse disease beyond the graft insertion (LAD small in caliber and reasonable PCI target), SVG-RI patent. No change from prior study.  Medical  therapy.  - Echo (10/05/12):  EF 55-60%, true apex HK, Gr 1 DD, Ao root 40 mm, Asc Ao 44 mm, Tr MR, PASP 26.  - Nuclear (02/02/12):  Inf infarct with very mild peri-infarct ischemia, EF 49%.   Recent Labs: 09/13/2012: Pro B Natriuretic peptide (BNP) 117.4  07/04/2013: HDL Cholesterol 43; LDL (calc) 129*  07/10/2013: ALT 12  07/11/2013: Creatinine 0.86; Hemoglobin 14.8; Potassium 4.0; TSH 0.287*   Wt Readings from Last 3 Encounters:  07/20/13 173 lb 1.9 oz (78.527 kg)  07/10/13 171 lb 11.8 oz (77.9 kg)  07/05/13 171 lb 11.8 oz (77.9 kg)     Past Medical History  Diagnosis Date  . Hypertension   . Stroke     h/o pontine CVA  . AAA (abdominal aortic aneurysm)     s/p repair 6/11  . Hyperlipidemia   . CAD (coronary artery disease)     s/p CABG 2/12:  LIMA to LAD, SVG to diagonal-1, SVG to ramus intermedius,, SVG to AM (Dr. Roxan Hockey) ;  b.  Myoview 10/13:  inf infarct with very mild peri-infarct ischemia, EF 49%, inf HK; c. Cath 2/25 showed 2/4 occluded grafts & high grade stenosis in the LAD distal to the LIMA insertion, not amenable to PCI - Med Rx  . Carotid stenosis     a.  dopplers 3/24: LICA 40-10%;  b. Carotid dopplers 3/14:  R 0-39%, L 60-79%, repeat 6 mos  . PAD (peripheral artery disease)     ABIs 4/12:  R 0.88, L 0.92; R SFA 40%, L CFA 50%, L SFA  50-60%  . COPD (chronic obstructive pulmonary disease)   . Inguinal hernia   . Thyroid nodule     incidental finding on carotid doppler 3/14 => thyroid U/S ordered  . Myocardial infarction   . Anginal pain     occ last 1-2 months ago  . Shortness of breath   . Pneumonia     hx  . GERD (gastroesophageal reflux disease)     Current Outpatient Prescriptions  Medication Sig Dispense Refill  . amLODipine (NORVASC) 5 MG tablet Take 1 tablet (5 mg total) by mouth daily.  30 tablet  5  . aspirin EC 81 MG tablet Take 81 mg by mouth daily.      Marland Kitchen atorvastatin (LIPITOR) 80 MG tablet Take 1 tablet (80 mg total) by mouth daily.  30  tablet  5  . gabapentin (NEURONTIN) 300 MG capsule Take 300,600 mg by mouth 2 (two) times daily. 1 capsule (300mg ) in the morning and 2 capsules (600mg ) at bedtime      . HYDROcodone-acetaminophen (NORCO) 10-325 MG per tablet Take 2 tablets by mouth every 12 (twelve) hours as needed for moderate pain.       . isosorbide mononitrate (IMDUR) 120 MG 24 hr tablet Take 1 tablet (120 mg total) by mouth daily.  30 tablet  5  . levofloxacin (LEVAQUIN) 750 MG tablet Take 1 tablet (750 mg total) by mouth daily.  7 tablet  0  . metoprolol tartrate (LOPRESSOR) 25 MG tablet Take 0.5 tablets (12.5 mg total) by mouth 2 (two) times daily.  60 tablet  5  . morphine (MS CONTIN) 30 MG 12 hr tablet Take 30 mg by mouth every 12 (twelve) hours.       . nitroGLYCERIN (NITROSTAT) 0.4 MG SL tablet Place 0.4 mg under the tongue every 5 (five) minutes as needed for chest pain.       Marland Kitchen nystatin ointment (MYCOSTATIN) Apply 1 application topically daily as needed (for leg.). For leg area      . ranolazine (RANEXA) 1000 MG SR tablet Take 1 tablet (1,000 mg total) by mouth 2 (two) times daily.  60 tablet  5   No current facility-administered medications for this visit.    Allergies:   Review of patient's allergies indicates no known allergies.   Social History:  The patient  reports that he quit smoking about 3 years ago. His smoking use included Cigarettes. He has a 30 pack-year smoking history. He has never used smokeless tobacco. He reports that he does not drink alcohol or use illicit drugs.   Family History:  The patient's family history includes Alcohol abuse in his father; Cancer in his sister.   ROS:  Please see the history of present illness.      All other systems reviewed and negative.   PHYSICAL EXAM: VS:  BP 100/60  Pulse 49  Ht 5' 6.95" (1.701 m)  Wt 173 lb 1.9 oz (78.527 kg)  BMI 27.14 kg/m2 Well nourished, well developed, in no acute distress HEENT: normal Neck: no JVDAt 90 Cardiac:  normal S1, S2;  RRR; no murmur Lungs:  Decreased breath sounds bilaterally, no wheezing, rhonchi or rales Abd: soft, nontender, no hepatomegaly Ext: Trace-1+ bilateral LE edema Skin: warm and dry Neuro:  CNs 2-12 intact, no focal abnormalities noted  EKG:  Sinus bradycardia, HR 49, QTc 453     ASSESSMENT AND PLAN:  1. CAD: He denies any recurrent angina. He is stable on a combination of amlodipine, nitrates, beta  blocker and ranolazine. His blood pressure is somewhat low. I will decrease his amlodipine to 2 point, grams daily. This may in turn help with some of his LE edema. Continue aspirin and statin. 2. Carotid Stenosis: Followup Dopplers were performed yesterday. Results are pending. 3. Thoracic Aortic Aneurysm: Chest CTA is due in 08/2013. 4. AAA Status Post Repair: Followed by vascular surgery. 5. Hypertension: Controlled. Adjust amlodipine is noted.  Check basic metabolic panel. 6. Hyperlipidemia: Continue statin. Dose was recently increased. Check lipids and LFTs in 2 months. 7. Pneumonia:  He has continued cough. He denies fever. He has completed his antibiotics. He has follow up with primary care tomorrow. 8. Edema: I suspect that this is mostly related to venous insufficiency.  Amlodipine may be contributing. He does report some decreased urination as well as dark urine color. I am not certain if daily diuretic as indicated here. I will check a basic metabolic panel. I asked him to keep his legs elevated and to wear compression stockings. 9. Disposition: Follow up with Dr. Percival Spanish in May.  Signed, Richardson Dopp, PA-C  07/20/2013 8:31 AM

## 2013-07-21 ENCOUNTER — Ambulatory Visit (INDEPENDENT_AMBULATORY_CARE_PROVIDER_SITE_OTHER): Payer: Commercial Managed Care - HMO | Admitting: Family Medicine

## 2013-07-21 ENCOUNTER — Ambulatory Visit: Payer: Commercial Managed Care - HMO

## 2013-07-21 ENCOUNTER — Encounter: Payer: Self-pay | Admitting: Family Medicine

## 2013-07-21 VITALS — BP 122/62 | HR 60 | Temp 97.5°F | Resp 18 | Wt 166.0 lb

## 2013-07-21 DIAGNOSIS — M79604 Pain in right leg: Secondary | ICD-10-CM

## 2013-07-21 DIAGNOSIS — M79609 Pain in unspecified limb: Secondary | ICD-10-CM

## 2013-07-21 DIAGNOSIS — J189 Pneumonia, unspecified organism: Secondary | ICD-10-CM

## 2013-07-21 DIAGNOSIS — J449 Chronic obstructive pulmonary disease, unspecified: Secondary | ICD-10-CM

## 2013-07-21 DIAGNOSIS — E785 Hyperlipidemia, unspecified: Secondary | ICD-10-CM

## 2013-07-21 DIAGNOSIS — M79605 Pain in left leg: Secondary | ICD-10-CM

## 2013-07-21 MED ORDER — GABAPENTIN 300 MG PO CAPS: ORAL_CAPSULE | ORAL | Status: DC

## 2013-07-21 NOTE — Patient Instructions (Signed)
Wear compression hoses as directed to help reduce leg swelling. Try to elevate legs during the day. I have refilled Gabapentin- Take 1 capsule at bedtime through the weekend. Starting Monday, Take 1 capsule in the morning and 1 capsule at bedtime for 1 week. Then increase bedtime dose to 2 capsules and keep taking 1 capsule every morning.  Labs were checked today; BMET was supposed to be checked and Lipids and Hepatic panel were supposed to be checked in 2 months. Those labs may have to be rechecked by the cardiologist.  I will see you again in 4 months or sooner if you have an acute problem or problems with your medications.

## 2013-07-21 NOTE — Progress Notes (Signed)
S:  This 63 y.o. 24 male was recently hospitalized w/ cardiac issues- chronic lower ext edema and PAD, weakness, ongoing CAD/chest pain s/p CABG, COPD and Healthcare acquired pneumonia. He is here for follow-up re: pneumonia; antibiotic (Levaquin) completed. He still has slightly prod cough and SOB (at baseline). No report of fever/chills, anorexia, pleuritic CP, severe SOB or orthopnea, n/v/d, HA, asymmetric numbness or syncope.  Chronic leg pain due to PAD and lumbar DDD. Gabapentin was effective but he has been off this med; pt requests medication renewal. Chronic edema- pt advised to wear compression hose and he states compliance but not wearing them today. He is not consistent w/ leg elevation.   Pt has chronic lip[id disorder and medication was recently reduced by half per pt hx.  He reports some weakness in lower ext > upper ext. This may be due to factors in addition to statin.  Patient Active Problem List   Diagnosis Date Noted  . HCAP (healthcare-associated pneumonia) 07/10/2013  . Other and unspecified angina pectoris 07/04/2013  . Bilateral leg pain 10/31/2012  . Nontoxic multinodular goiter 10/07/2012  . Unstable angina 06/19/2012  . Carotid stenosis   . PAD (peripheral artery disease)   . Left inguinal hernia 05/30/2012  . COPD (chronic obstructive pulmonary disease) 08/24/2011  . S/P AAA repair 08/24/2011  . Lumbar disc disease   . Hypertensive heart disease   . Hyperlipidemia   . CAD (coronary artery disease)   . History of pontine CVA   . GERD (gastroesophageal reflux disease)    Prior to Admission medications   Medication Sig Start Date End Date Taking? Authorizing Provider  amLODipine (NORVASC) 2.5 MG tablet Take 1 tablet (2.5 mg total) by mouth daily. 07/20/13  Yes Liliane Shi, PA-C  aspirin EC 81 MG tablet Take 81 mg by mouth daily.   Yes Historical Provider, MD  atorvastatin (LIPITOR) 80 MG tablet Take 1 tablet (80 mg total) by mouth daily. 07/05/13  Yes Tarri Fuller, PA-C  gabapentin (NEURONTIN) 300 MG capsule Take 1 capsule (300mg ) in the morning and 2 capsules (600mg ) at bedtime   Yes Barton Fanny, MD  HYDROcodone-acetaminophen (NORCO) 10-325 MG per tablet Take 2 tablets by mouth every 12 (twelve) hours as needed for moderate pain.    Yes Historical Provider, MD  isosorbide mononitrate (IMDUR) 120 MG 24 hr tablet Take 1 tablet (120 mg total) by mouth daily. 07/05/13  Yes Tarri Fuller, PA-C  metoprolol tartrate (LOPRESSOR) 25 MG tablet Take 0.5 tablets (12.5 mg total) by mouth 2 (two) times daily. 07/05/13  Yes Tarri Fuller, PA-C  morphine (MS CONTIN) 30 MG 12 hr tablet Take 30 mg by mouth every 12 (twelve) hours.  07/07/13  Yes Historical Provider, MD  nitroGLYCERIN (NITROSTAT) 0.4 MG SL tablet Place 0.4 mg under the tongue every 5 (five) minutes as needed for chest pain.  06/01/13  Yes Minus Breeding, MD  nystatin ointment (MYCOSTATIN) Apply 1 application topically daily as needed (for leg.). For leg area 08/11/12  Yes Eleanore E Egan, PA-C  ranolazine (RANEXA) 1000 MG SR tablet Take 1 tablet (1,000 mg total) by mouth 2 (two) times daily. 07/05/13  Yes Tarri Fuller, PA-C   PMHx, Surg Hx, Soc and Fam Hx reveiwed.  ROS: As per HPI. No change since office visit yesterday at [facility]   O: Filed Vitals:   07/21/13 1445  BP: 122/62  Pulse: 60  Temp: 97.5 F (36.4 C)  Resp: 18   GEN: In NAD;  appears chronically ill. HENT: [place]/AT; EOMI w/ clear conj/ sclerae. Otherwise unremarkable. COR: RRR; LE edema- chronic 2-3+. LUNGS: Distant BS throughout; no rhonchi or rales. SKIN: Lower exr- erythema c/w venous stasis disease. No skin breakdown/ulcerations. NEURO: A&O x 3; CNs intact. Gait- unsteady, uses walker to ambulate.  [facility] reading (PRIMARY) by  Dr. Leward Quan: CXR- Compared to 07/10/2013- resolution of left basilar infiltrate.  A/P: Pneumonia - Resolved.   Plan: DG Chest 1 View  Bilateral leg pain - RX: Gabapentin 300 mg 1 cap hs to  start; after 3 days, add 1 cap in morning and continue 1 cap hs. After 1 week, maintenance dose is 1 cap in morning and 2 caps hs. Plan: Basic Metabolic Panel (BMET)  COPD (chronic obstructive pulmonary disease)- Stable.  HLD (hyperlipidemia) - Plan: Lipid Profile, Hepatic function panel, Lipid Profile  (Labs were checked today; note from [facility] states that lipids will be checked in 2 months since statin dose was reduced.)

## 2013-07-28 ENCOUNTER — Encounter (HOSPITAL_COMMUNITY): Payer: Self-pay | Admitting: Emergency Medicine

## 2013-07-28 ENCOUNTER — Telehealth: Payer: Self-pay | Admitting: *Deleted

## 2013-07-28 ENCOUNTER — Emergency Department (HOSPITAL_COMMUNITY)
Admission: EM | Admit: 2013-07-28 | Discharge: 2013-07-28 | Disposition: A | Discharge status: Home / Self Care | Payer: Medicare HMO | Attending: Emergency Medicine | Admitting: Emergency Medicine

## 2013-07-28 DIAGNOSIS — R300 Dysuria: Secondary | ICD-10-CM | POA: Diagnosis not present

## 2013-07-28 DIAGNOSIS — J441 Chronic obstructive pulmonary disease with (acute) exacerbation: Secondary | ICD-10-CM | POA: Insufficient documentation

## 2013-07-28 DIAGNOSIS — I498 Other specified cardiac arrhythmias: Secondary | ICD-10-CM | POA: Insufficient documentation

## 2013-07-28 DIAGNOSIS — I2510 Atherosclerotic heart disease of native coronary artery without angina pectoris: Secondary | ICD-10-CM | POA: Diagnosis not present

## 2013-07-28 DIAGNOSIS — I209 Angina pectoris, unspecified: Secondary | ICD-10-CM | POA: Insufficient documentation

## 2013-07-28 DIAGNOSIS — Z87891 Personal history of nicotine dependence: Secondary | ICD-10-CM | POA: Diagnosis not present

## 2013-07-28 DIAGNOSIS — R109 Unspecified abdominal pain: Secondary | ICD-10-CM | POA: Insufficient documentation

## 2013-07-28 DIAGNOSIS — E785 Hyperlipidemia, unspecified: Secondary | ICD-10-CM | POA: Insufficient documentation

## 2013-07-28 DIAGNOSIS — R339 Retention of urine, unspecified: Secondary | ICD-10-CM | POA: Diagnosis not present

## 2013-07-28 DIAGNOSIS — Z8673 Personal history of transient ischemic attack (TIA), and cerebral infarction without residual deficits: Secondary | ICD-10-CM | POA: Diagnosis not present

## 2013-07-28 DIAGNOSIS — Z8701 Personal history of pneumonia (recurrent): Secondary | ICD-10-CM | POA: Diagnosis not present

## 2013-07-28 DIAGNOSIS — I10 Essential (primary) hypertension: Secondary | ICD-10-CM | POA: Diagnosis not present

## 2013-07-28 DIAGNOSIS — Z7982 Long term (current) use of aspirin: Secondary | ICD-10-CM | POA: Insufficient documentation

## 2013-07-28 DIAGNOSIS — Z8719 Personal history of other diseases of the digestive system: Secondary | ICD-10-CM | POA: Diagnosis not present

## 2013-07-28 DIAGNOSIS — Z79899 Other long term (current) drug therapy: Secondary | ICD-10-CM | POA: Diagnosis not present

## 2013-07-28 DIAGNOSIS — Z951 Presence of aortocoronary bypass graft: Secondary | ICD-10-CM | POA: Insufficient documentation

## 2013-07-28 DIAGNOSIS — I252 Old myocardial infarction: Secondary | ICD-10-CM | POA: Diagnosis not present

## 2013-07-28 LAB — URINALYSIS, ROUTINE W REFLEX MICROSCOPIC
GLUCOSE, UA: NEGATIVE mg/dL
HGB URINE DIPSTICK: NEGATIVE
KETONES UR: NEGATIVE mg/dL
Nitrite: NEGATIVE
PH: 6 (ref 5.0–8.0)
PROTEIN: NEGATIVE mg/dL
Specific Gravity, Urine: 1.019 (ref 1.005–1.030)
Urobilinogen, UA: 1 mg/dL (ref 0.0–1.0)

## 2013-07-28 LAB — URINE MICROSCOPIC-ADD ON

## 2013-07-28 MED ORDER — LIDOCAINE HCL 2 % EX GEL
CUTANEOUS | Status: AC
  Filled 2013-07-28: qty 10

## 2013-07-28 MED ORDER — LIDOCAINE HCL 2 % EX GEL: Freq: Once | CUTANEOUS | Status: DC

## 2013-07-28 NOTE — ED Notes (Signed)
Bed: WA21 Expected date:  Expected time:  Means of arrival:  Comments: EMS 61M Urine Retention

## 2013-07-28 NOTE — ED Provider Notes (Signed)
CSN: [id number]     Arrival date & time 07/28/13  2033 History   First MD Initiated Contact with Patient 07/28/13 2053     Chief Complaint  Patient presents with  . Urinary Retention     (Consider location/radiation/quality/duration/timing/severity/associated sxs/prior Treatment) Patient is a 63 y.o. male presenting with dysuria.  Dysuria This is a new problem. The current episode started in the past 7 days. The problem occurs intermittently. The problem has been gradually worsening. Associated symptoms include abdominal pain and urinary symptoms. Pertinent negatives include no nausea. He has tried nothing for the symptoms.    Past Medical History  Diagnosis Date  . Hypertension   . Stroke     h/o pontine CVA  . AAA (abdominal aortic aneurysm)     s/p repair 6/11  . Hyperlipidemia   . CAD (coronary artery disease)     s/p CABG 2/12:  LIMA to LAD, SVG to diagonal-1, SVG to ramus intermedius,, SVG to AM (Dr. Roxan Hockey) ;  b.  Myoview 10/13:  inf infarct with very mild peri-infarct ischemia, EF 49%, inf HK; c. Cath 2/25 showed 2/4 occluded grafts & high grade stenosis in the LAD distal to the LIMA insertion, not amenable to PCI - Med Rx  . Carotid stenosis     a.  dopplers 0/86: LICA 76-19%;  b. Carotid dopplers 3/14:  R 0-39%, L 60-79%, repeat 6 mos  . PAD (peripheral artery disease)     ABIs 4/12:  R 0.88, L 0.92; R SFA 40%, L CFA 50%, L SFA 50-60%  . COPD (chronic obstructive pulmonary disease)   . Inguinal hernia   . Thyroid nodule     incidental finding on carotid doppler 3/14 => thyroid U/S ordered  . Myocardial infarction   . Anginal pain     occ last 1-2 months ago  . Shortness of breath   . Pneumonia     hx  . GERD (gastroesophageal reflux disease)    Past Surgical History  Procedure Laterality Date  . Hip surgery  40 years ago    left hip bone removal and pinning  . Abdominal aortic aneurysm repair  10-03-2009  . Exploratory laparotomy  09/2009    ligation of  lumbar arteries  . Coronary artery bypass graft      LIMA to the LAD, SVG to first diagonal, SVG to ramus intermediate, SVG to acute marginal. EF 50%. 2/12  . External ear surgery Left     laceration child  . Inguinal hernia repair Left 07/21/2012    Procedure: HERNIA REPAIR INGUINAL ADULT;  Surgeon: Merrie Roof, MD;  Location: [facility];  Service: General;  Laterality: Left;  . Insertion of mesh Left 07/21/2012    Procedure: INSERTION OF MESH;  Surgeon: Merrie Roof, MD;  Location: [place];  Service: General;  Laterality: Left;   Family History  Problem Relation Age of Onset  . Alcohol abuse Father   . Cancer Sister    History  Substance Use Topics  . Smoking status: Former Smoker -- 1.00 packs/day for 30 years    Types: Cigarettes    Quit date: 01/25/2010  . Smokeless tobacco: Never Used  . Alcohol Use: No     Comment: former drinker    Review of Systems  Respiratory: Positive for wheezing.   Gastrointestinal: Positive for abdominal pain. Negative for nausea and abdominal distention.  Genitourinary: Positive for dysuria and difficulty urinating.  All other systems reviewed and are negative.  Allergies  Review of patient's allergies indicates no known allergies.  Home Medications   Current Outpatient Rx  Name  Route  Sig  Dispense  Refill  . amLODipine (NORVASC) 2.5 MG tablet   Oral   Take 1 tablet (2.5 mg total) by mouth daily.   30 tablet   11   . aspirin EC 81 MG tablet   Oral   Take 81 mg by mouth daily.         Marland Kitchen atorvastatin (LIPITOR) 80 MG tablet   Oral   Take 1 tablet (80 mg total) by mouth daily.   30 tablet   5   . gabapentin (NEURONTIN) 300 MG capsule      Take 1 capsule (300mg ) in the morning and 2 capsules (600mg ) at bedtime   90 capsule   3   . HYDROcodone-acetaminophen (NORCO) 10-325 MG per tablet   Oral   Take 2 tablets by mouth every 12 (twelve) hours as needed for moderate pain.          . isosorbide mononitrate (IMDUR) 120  MG 24 hr tablet   Oral   Take 1 tablet (120 mg total) by mouth daily.   30 tablet   5   . metoprolol tartrate (LOPRESSOR) 25 MG tablet   Oral   Take 0.5 tablets (12.5 mg total) by mouth 2 (two) times daily.   60 tablet   5   . morphine (MS CONTIN) 30 MG 12 hr tablet   Oral   Take 30 mg by mouth every 12 (twelve) hours.          . ranolazine (RANEXA) 1000 MG SR tablet   Oral   Take 1 tablet (1,000 mg total) by mouth 2 (two) times daily.   60 tablet   5   . nitroGLYCERIN (NITROSTAT) 0.4 MG SL tablet   Sublingual   Place 0.4 mg under the tongue every 5 (five) minutes as needed for chest pain.           BP 155/86  Pulse 56  Temp(Src) 98.2 F (36.8 C) (Oral)  Resp 18  SpO2 96% Physical Exam  Nursing note and vitals reviewed. Constitutional: He is oriented to person, place, and time. He appears well-developed and well-nourished.  HENT:  Head: Normocephalic.  Eyes: Pupils are equal, round, and reactive to light.  Neck: Normal range of motion.  Cardiovascular: Regular rhythm.  Bradycardia present.   Pulmonary/Chest: No respiratory distress. He has wheezes. He has rales.  Abdominal: Soft. Bowel sounds are normal. There is no tenderness.  Musculoskeletal: He exhibits no edema and no tenderness.  Lymphadenopathy:    He has no cervical adenopathy.  Neurological: He is alert and oriented to person, place, and time.  Skin: Skin is warm and dry.  Psychiatric: He has a normal mood and affect. His behavior is normal. Judgment and thought content normal.    ED Course  Procedures (including critical care time) Labs Review Labs Reviewed  URINALYSIS, ROUTINE W REFLEX MICROSCOPIC   Imaging Review No results found.   EKG Interpretation None     Patient only voided about 50 cc.  Bladder scan revealed retained urine.  Foley cath inserted by nursing staff, with total output of ~1000 cc.   Patient states feels much more comfortable. No indication of infection in UA. Patient  instructed to follow-up with urology and/or PCP early next week. MDM   Final diagnoses:  None    Urinary retention.    Jenetta Downer  Tamala Julian, NP 07/28/13 2221

## 2013-07-28 NOTE — Discharge Instructions (Signed)
Acute Urinary Retention, Male °Acute urinary retention is the temporary inability to urinate. °This is a common problem in older men. As men age their prostates become larger and block the flow of urine from the bladder. This is usually a problem that has come on gradually.  °HOME CARE INSTRUCTIONS °If you are sent home with a Foley catheter and a drainage system, you will need to discuss the best course of action with your health care provider. While the catheter is in, maintain a good intake of fluids. Keep the drainage bag emptied and lower than your catheter. This is so that contaminated urine will not flow back into your bladder, which could lead to a urinary tract infection. °There are two main types of drainage bags. One is a large bag that usually is used at night. It has a good capacity that will allow you to sleep through the night without having to empty it. The second type is called a leg bag. It has a smaller capacity, so it needs to be emptied more frequently. However, the main advantage is that it can be attached by a leg strap and can go underneath your clothing, allowing you the freedom to move about or leave your home. °Only take over-the-counter or prescription medicines for pain, discomfort, or fever as directed by your health care provider.  °SEEK MEDICAL CARE IF: °· You develop a low-grade fever. °· You experience spasms or leakage of urine with the spasms. °SEEK IMMEDIATE MEDICAL CARE IF:  °· You develop chills or fever. °· Your catheter stops draining urine. °· Your catheter falls out. °· You start to develop increased bleeding that does not respond to rest and increased fluid intake. °MAKE SURE YOU: °· Understand these instructions. °· Will watch your condition. °· Will get help right away if you are not doing well or get worse. °Document Released: 07/20/2000 Document Revised: 12/14/2012 Document Reviewed: 09/22/2012 °Exit[employer]® Patient Information ©2014 [employer]. ° °

## 2013-07-28 NOTE — ED Notes (Signed)
PER EMS - pt from home with c/o urinary retention, saw PCP last week and told to come to ER today.  Pt c/o abd distention, denies other complaints.  A+Ox4.

## 2013-07-28 NOTE — ED Notes (Signed)
Initial Contact - pt resting on stretcher, reports difficulty fully emptying bladder for approx 1 mo.  Sts told to come to ER tonight by PCP.  Pt reports "it's not that bad right now".  Pt denies new complaints of pain.  Denies fevers/chills, painful urination.  Skin PWD.  Speaking full/clear sentences.  Congested cough noted, pt reports hx of COPD "and this is normal for me".  LS congested/rhonci.  Pt denies SOB.  Changed to hospital gown.  NAD.

## 2013-07-28 NOTE — ED Notes (Signed)
Pt foley changed to leg bag with instructions given, pt verbalized understanding.

## 2013-07-28 NOTE — Telephone Encounter (Signed)
I spoke with pt; he has been having trouble emptying his bladder for about 1 month. He forgot to mention this when he was in the office last week. He feels like his bladder is going to burst sometimes. During his recent hospitalization, he was not catheterized. This is a new problem for him.  I advised that he go to the ED tonight;  they may need to place a catheter until he can be evaluated by Urology. He states he understands and will go to ED tonight.

## 2013-07-28 NOTE — Telephone Encounter (Signed)
Dr. Leward Quan,  Pt's home health nurse called to inform you that pt has an enlarged prostate & is having trouble urinating.  She would like to know if you want to see him back in the office to discuss this.  Please call [phone] [name])

## 2013-07-29 ENCOUNTER — Telehealth: Payer: Self-pay

## 2013-07-29 NOTE — Telephone Encounter (Signed)
Pt went to the ER last night for urinary retention.

## 2013-07-29 NOTE — ED Provider Notes (Signed)
Medical screening examination/treatment/procedure(s) were performed by non-physician practitioner and as supervising physician I was immediately available for consultation/collaboration.   Lashell Moffitt E Nobie Alleyne, MD 07/29/13 1034 

## 2013-08-02 ENCOUNTER — Encounter: Payer: Self-pay | Admitting: *Deleted

## 2013-08-03 ENCOUNTER — Telehealth: Payer: Self-pay

## 2013-08-03 NOTE — Telephone Encounter (Signed)
Reed Pandy with [facility]  [phone]) called and wanted to inform dr Leward Quan this patient suffered a fall on Friday 07/28/13. she just wanted this info documented

## 2013-08-08 ENCOUNTER — Telehealth: Payer: Self-pay

## 2013-08-08 NOTE — Telephone Encounter (Signed)
[name] FROM [facility] WOULD LIKE ORDERS TO CONTINUE PT AND OT EVALUATION ON PT Chris Payne [phone]

## 2013-08-09 NOTE — Telephone Encounter (Signed)
Ok to put orders in to continue PT/OT?

## 2013-08-10 ENCOUNTER — Telehealth: Payer: Self-pay | Admitting: *Deleted

## 2013-08-10 DIAGNOSIS — R338 Other retention of urine: Principal | ICD-10-CM

## 2013-08-10 DIAGNOSIS — N401 Enlarged prostate with lower urinary tract symptoms: Secondary | ICD-10-CM

## 2013-08-10 NOTE — Telephone Encounter (Signed)
I have signed authorization for follow-up with Dr. Jasmine December w/ whom this pt is already established.Chris Payne

## 2013-08-10 NOTE — Telephone Encounter (Signed)
Yes, okay to continue PT/OT.

## 2013-08-10 NOTE — Telephone Encounter (Signed)
Tiffany from [facility] calling for a referral for urinary rention, prostate swelling. He has a urologist but he needs a new referral.  I have pended referral ok to place?    [facility] Fax [phone] April 30 is his next visit

## 2013-08-14 NOTE — Telephone Encounter (Signed)
LMOM on [name]'s VM w/VO to cont PT/OT per Dr Leward Quan.

## 2013-08-16 ENCOUNTER — Telehealth: Payer: Self-pay | Admitting: *Deleted

## 2013-08-16 NOTE — Telephone Encounter (Signed)
Faxed signed order to [facility], per Dr Leward Quan. Confirmation received at 9:38 am

## 2013-08-23 ENCOUNTER — Emergency Department (HOSPITAL_COMMUNITY): Payer: Medicare HMO

## 2013-08-23 ENCOUNTER — Telehealth: Payer: Self-pay

## 2013-08-23 ENCOUNTER — Ambulatory Visit (INDEPENDENT_AMBULATORY_CARE_PROVIDER_SITE_OTHER): Payer: Commercial Managed Care - HMO | Admitting: Family Medicine

## 2013-08-23 ENCOUNTER — Encounter (HOSPITAL_COMMUNITY): Payer: Self-pay | Admitting: Emergency Medicine

## 2013-08-23 ENCOUNTER — Inpatient Hospital Stay (HOSPITAL_COMMUNITY)
Admission: EM | Admit: 2013-08-23 | Discharge: 2013-08-26 | DRG: 069 | Disposition: A | Discharge status: Home Health | Payer: Medicare HMO | Attending: Family Medicine | Admitting: Family Medicine

## 2013-08-23 ENCOUNTER — Inpatient Hospital Stay (HOSPITAL_COMMUNITY): Payer: Medicare HMO

## 2013-08-23 VITALS — BP 126/74 | HR 60 | Temp 97.7°F | Resp 16 | Ht 64.0 in | Wt 156.5 lb

## 2013-08-23 DIAGNOSIS — I739 Peripheral vascular disease, unspecified: Secondary | ICD-10-CM

## 2013-08-23 DIAGNOSIS — R531 Weakness: Secondary | ICD-10-CM

## 2013-08-23 DIAGNOSIS — K219 Gastro-esophageal reflux disease without esophagitis: Secondary | ICD-10-CM | POA: Diagnosis present

## 2013-08-23 DIAGNOSIS — M545 Low back pain, unspecified: Secondary | ICD-10-CM | POA: Diagnosis present

## 2013-08-23 DIAGNOSIS — M79604 Pain in right leg: Secondary | ICD-10-CM

## 2013-08-23 DIAGNOSIS — Z8673 Personal history of transient ischemic attack (TIA), and cerebral infarction without residual deficits: Secondary | ICD-10-CM

## 2013-08-23 DIAGNOSIS — R292 Abnormal reflex: Secondary | ICD-10-CM

## 2013-08-23 DIAGNOSIS — G8929 Other chronic pain: Secondary | ICD-10-CM | POA: Diagnosis present

## 2013-08-23 DIAGNOSIS — J449 Chronic obstructive pulmonary disease, unspecified: Secondary | ICD-10-CM

## 2013-08-23 DIAGNOSIS — R29898 Other symptoms and signs involving the musculoskeletal system: Secondary | ICD-10-CM | POA: Diagnosis present

## 2013-08-23 DIAGNOSIS — G459 Transient cerebral ischemic attack, unspecified: Principal | ICD-10-CM

## 2013-08-23 DIAGNOSIS — Z8679 Personal history of other diseases of the circulatory system: Secondary | ICD-10-CM

## 2013-08-23 DIAGNOSIS — J4489 Other specified chronic obstructive pulmonary disease: Secondary | ICD-10-CM | POA: Diagnosis present

## 2013-08-23 DIAGNOSIS — I6529 Occlusion and stenosis of unspecified carotid artery: Secondary | ICD-10-CM

## 2013-08-23 DIAGNOSIS — Z87891 Personal history of nicotine dependence: Secondary | ICD-10-CM

## 2013-08-23 DIAGNOSIS — Z9889 Other specified postprocedural states: Secondary | ICD-10-CM

## 2013-08-23 DIAGNOSIS — I2510 Atherosclerotic heart disease of native coronary artery without angina pectoris: Secondary | ICD-10-CM

## 2013-08-23 DIAGNOSIS — Z7982 Long term (current) use of aspirin: Secondary | ICD-10-CM

## 2013-08-23 DIAGNOSIS — R258 Other abnormal involuntary movements: Secondary | ICD-10-CM

## 2013-08-23 DIAGNOSIS — M6281 Muscle weakness (generalized): Secondary | ICD-10-CM

## 2013-08-23 DIAGNOSIS — M519 Unspecified thoracic, thoracolumbar and lumbosacral intervertebral disc disorder: Secondary | ICD-10-CM

## 2013-08-23 DIAGNOSIS — R42 Dizziness and giddiness: Secondary | ICD-10-CM

## 2013-08-23 DIAGNOSIS — M79605 Pain in left leg: Secondary | ICD-10-CM

## 2013-08-23 DIAGNOSIS — I25810 Atherosclerosis of coronary artery bypass graft(s) without angina pectoris: Secondary | ICD-10-CM | POA: Diagnosis present

## 2013-08-23 DIAGNOSIS — I119 Hypertensive heart disease without heart failure: Secondary | ICD-10-CM

## 2013-08-23 DIAGNOSIS — N390 Urinary tract infection, site not specified: Secondary | ICD-10-CM

## 2013-08-23 DIAGNOSIS — I498 Other specified cardiac arrhythmias: Secondary | ICD-10-CM | POA: Diagnosis present

## 2013-08-23 DIAGNOSIS — E785 Hyperlipidemia, unspecified: Secondary | ICD-10-CM

## 2013-08-23 DIAGNOSIS — N400 Enlarged prostate without lower urinary tract symptoms: Secondary | ICD-10-CM | POA: Diagnosis present

## 2013-08-23 DIAGNOSIS — M1990 Unspecified osteoarthritis, unspecified site: Secondary | ICD-10-CM | POA: Diagnosis present

## 2013-08-23 DIAGNOSIS — I252 Old myocardial infarction: Secondary | ICD-10-CM

## 2013-08-23 LAB — CBC WITH DIFFERENTIAL/PLATELET
Basophils Absolute: 0 10*3/uL (ref 0.0–0.1)
Basophils Relative: 0 % (ref 0–1)
Eosinophils Absolute: 0.1 10*3/uL (ref 0.0–0.7)
Eosinophils Relative: 1 % (ref 0–5)
HCT: 40.8 % (ref 39.0–52.0)
Hemoglobin: 13.8 g/dL (ref 13.0–17.0)
LYMPHS PCT: 26 % (ref 12–46)
Lymphs Abs: 1.8 10*3/uL (ref 0.7–4.0)
MCH: 33.7 pg (ref 26.0–34.0)
MCHC: 33.8 g/dL (ref 30.0–36.0)
MCV: 99.5 fL (ref 78.0–100.0)
MONOS PCT: 11 % (ref 3–12)
Monocytes Absolute: 0.8 10*3/uL (ref 0.1–1.0)
NEUTROS PCT: 62 % (ref 43–77)
Neutro Abs: 4.3 10*3/uL (ref 1.7–7.7)
PLATELETS: 242 10*3/uL (ref 150–400)
RBC: 4.1 MIL/uL — ABNORMAL LOW (ref 4.22–5.81)
RDW: 13.3 % (ref 11.5–15.5)
WBC: 6.9 10*3/uL (ref 4.0–10.5)

## 2013-08-23 LAB — POCT CBC
Granulocyte percent: 66.5 %G (ref 37–80)
HCT, POC: 40.9 % — AB (ref 43.5–53.7)
Hemoglobin: 12.9 g/dL — AB (ref 14.1–18.1)
LYMPH, POC: 1.7 (ref 0.6–3.4)
MCH: 32.3 pg — AB (ref 27–31.2)
MCHC: 31.5 g/dL — AB (ref 31.8–35.4)
MCV: 102.2 fL — AB (ref 80–97)
MID (CBC): 0.5 (ref 0–0.9)
MPV: 7.6 fL (ref 0–99.8)
PLATELET COUNT, POC: 276 10*3/uL (ref 142–424)
POC Granulocyte: 4.2 (ref 2–6.9)
POC LYMPH %: 26.2 % (ref 10–50)
POC MID %: 7.3 % (ref 0–12)
RBC: 4 M/uL — AB (ref 4.69–6.13)
RDW, POC: 14.1 %
WBC: 6.3 10*3/uL (ref 4.6–10.2)

## 2013-08-23 LAB — POCT URINALYSIS DIPSTICK
Glucose, UA: NEGATIVE
NITRITE UA: POSITIVE
PH UA: 5.5
Protein, UA: 100
UROBILINOGEN UA: 1

## 2013-08-23 LAB — TROPONIN I

## 2013-08-23 LAB — URINALYSIS, ROUTINE W REFLEX MICROSCOPIC
Glucose, UA: NEGATIVE mg/dL
KETONES UR: NEGATIVE mg/dL
NITRITE: POSITIVE — AB
PROTEIN: NEGATIVE mg/dL
Specific Gravity, Urine: 1.021 (ref 1.005–1.030)
UROBILINOGEN UA: 1 mg/dL (ref 0.0–1.0)
pH: 6 (ref 5.0–8.0)

## 2013-08-23 LAB — COMPREHENSIVE METABOLIC PANEL
ALK PHOS: 91 U/L (ref 39–117)
ALT: 15 U/L (ref 0–53)
AST: 16 U/L (ref 0–37)
Albumin: 3.1 g/dL — ABNORMAL LOW (ref 3.5–5.2)
BILIRUBIN TOTAL: 0.6 mg/dL (ref 0.3–1.2)
BUN: 18 mg/dL (ref 6–23)
CHLORIDE: 96 meq/L (ref 96–112)
CO2: 26 meq/L (ref 19–32)
Calcium: 9.3 mg/dL (ref 8.4–10.5)
Creatinine, Ser: 0.89 mg/dL (ref 0.50–1.35)
GFR calc Af Amer: >90 mL/min (ref >90)
GFR calc non Af Amer: 88 mL/min — ABNORMAL LOW (ref >90)
GLUCOSE: 97 mg/dL (ref 70–99)
Potassium: 4.6 mEq/L (ref 3.7–5.3)
SODIUM: 135 meq/L — AB (ref 137–147)
Total Protein: 7.1 g/dL (ref 6.0–8.3)

## 2013-08-23 LAB — PROTIME-INR
INR: 1.1 (ref 0.00–1.49)
Prothrombin Time: 14 seconds (ref 11.6–15.2)

## 2013-08-23 LAB — GLUCOSE, POCT (MANUAL RESULT ENTRY): POC Glucose: 101 mg/dl — AB (ref 70–99)

## 2013-08-23 LAB — APTT: aPTT: 36 seconds (ref 24–37)

## 2013-08-23 LAB — URINE MICROSCOPIC-ADD ON

## 2013-08-23 NOTE — Progress Notes (Addendum)
Subjective:    Patient ID: Chris Payne, male    DOB: [birth year], 63 y.o.   MRN: [id number]  HPI This chart was scribed for Kristopher Attwood-MD by Lovena Le Day, Scribe. This patient was seen in room 11 and the patient's care was started at 5:39 PM.  HPI Comments: Chris Payne is a 63 y.o. male with a h/o CAD/CABG, previous CVA, hernia repair and aneurysm repair presents to the [facility] for a single episode of dizziness which occurred today during his physical therapy appointment.  He reports today he was having physical therapy and was walking down the block in front of his house. In this episode of dizziness which lasted about one minute; he describes the sensation as if he were walking on a boat, the entire right side of his body suddenly became weak and caused him to almost fall to the side but his therapist was there to hold him up.   He states felt weak in his right arm and right leg for x30-60 seconds. It took him about 45 seconds to stand back up; no LOC. He was able to stand back up and walk to his home. He states this dizziness lasted about one minute. He denies weakness/numbness of any of his extremities. He has a hx of CVA and reports that he did not have sx of weakness with that but did have speech abnormalities. He denies any speech or swallowing abnormalities today. After this dizziness occurred today, his physical therapist checked his BP twice, he states it was high the first time and low the second time. He also reports having some blurred vision during his dizziness episode which lasted also for about x30 seconds; he reports had another brief episode of blurred vision while in the lobby here at [facility]. He states that he has been eating well lately and has a good appetite; denies any recent weight loss or gain.   Today, he denies any speech abnormalities, difficulty swallowing, numbness/tingling, HA and is not dizzy at the moment. He states does have some residual  weakness of his lower legs from hip surgery many years ago. He also denies CP, palpitations, nausea, emesis, diarrhea and denies any recent medicine changes. He has been taking mucinex for an intermittent cough ongoing over the past x2 or x3 months; he states productive of white/yellow sputum; also states subjective fever. He reports did have some constipation last week which has since resolved.   He takes amlodipine and lipitor. He lives at home with his wife, his son and his girlfriend.  His PCP is Dr. Leward Quan.   Past Medical History  Diagnosis Date  . Hypertension   . Stroke     h/o pontine CVA  . AAA (abdominal aortic aneurysm)     s/p repair 6/11  . Hyperlipidemia   . CAD (coronary artery disease)     s/p CABG 2/12:  LIMA to LAD, SVG to diagonal-1, SVG to ramus intermedius,, SVG to AM (Dr. Roxan Hockey) ;  b.  Myoview 10/13:  inf infarct with very mild peri-infarct ischemia, EF 49%, inf HK; c. Cath 2/25 showed 2/4 occluded grafts & high grade stenosis in the LAD distal to the LIMA insertion, not amenable to PCI - Med Rx  . Carotid stenosis     a.  dopplers 3/01: LICA 60-10%;  b. Carotid dopplers 3/14:  R 0-39%, L 60-79%, repeat 6 mos  . PAD (peripheral artery disease)     ABIs 4/12:  R  0.88, L 0.92; R SFA 40%, L CFA 50%, L SFA 50-60%  . COPD (chronic obstructive pulmonary disease)   . Inguinal hernia   . Thyroid nodule     incidental finding on carotid doppler 3/14 => thyroid U/S ordered  . Myocardial infarction   . Anginal pain     occ last 1-2 months ago  . Shortness of breath   . Pneumonia     hx  . GERD (gastroesophageal reflux disease)    No Known Allergies  No orders of the defined types were placed in this encounter.   History   Social History  . Marital Status: Married    Spouse Name: N/A    Number of Children: N/A  . Years of Education: N/A   Occupational History  . Not on file.   Social History Main Topics  . Smoking status: Former Smoker -- 1.00  packs/day for 30 years    Types: Cigarettes    Quit date: 01/25/2010  . Smokeless tobacco: Never Used  . Alcohol Use: No     Comment: former drinker  . Drug Use: No  . Sexual Activity: No   Other Topics Concern  . Not on file   Social History Narrative   Retired Journalist, newspaper.  Lives with wife and stepson. Married x 13 years.    Review of Systems  Constitutional: Negative for fever, chills, diaphoresis and fatigue.  Eyes: Positive for visual disturbance. Negative for photophobia.  Respiratory: Positive for cough. Negative for shortness of breath.   Cardiovascular: Negative for chest pain and palpitations.  Gastrointestinal: Negative for nausea and vomiting.  Musculoskeletal: Negative for back pain.  Skin: Negative for rash.  Neurological: Positive for dizziness, weakness and light-headedness. Negative for tremors, seizures, syncope, facial asymmetry, speech difficulty, numbness and headaches.  Psychiatric/Behavioral: Negative for confusion.  All other systems reviewed and are negative.     Objective:   Physical Exam  Nursing note and vitals reviewed. Constitutional: He is oriented to person, place, and time. He appears well-developed and well-nourished. No distress.  HENT:  Head: Normocephalic and atraumatic.  Mouth/Throat: Oropharynx is clear and moist.  Eyes: Conjunctivae and EOM are normal. Pupils are equal, round, and reactive to light. Right eye exhibits no discharge. Left eye exhibits no discharge.  Neck: Normal range of motion. Neck supple. No JVD present. No thyromegaly present.  Cardiovascular: Normal rate, regular rhythm and normal heart sounds.  Exam reveals no gallop and no friction rub.   No murmur heard. Pulmonary/Chest: Effort normal and breath sounds normal. No respiratory distress. He has no wheezes. He has no rales.  Abdominal: Soft. Bowel sounds are normal. He exhibits no distension and no mass. There is no tenderness. There is no rebound and no  guarding.  Musculoskeletal: Normal range of motion. He exhibits no edema.  Motor 5/5 LUE; 4/5 RUE.  Grip 4/5 R hand. Finger to nose intact. 4/5 motor strength RLE. 5/5 motor strength LLE  Lymphadenopathy:    He has no cervical adenopathy.  Neurological: He is alert and oriented to person, place, and time. No cranial nerve deficit.  Skin: Skin is warm and dry. No rash noted. He is not diaphoretic.  Psychiatric: He has a normal mood and affect. His behavior is normal. Judgment and thought content normal.   Triage Vitals: BP 126/74  Pulse 60  Temp(Src) 97.7 F (36.5 C) (Oral)  Resp 16  Ht 5\' 4"  (1.626 m)  Wt 156 lb 8 oz (70.988 kg)  BMI 26.85  kg/m2  SpO2 95%     Results for orders placed in visit on 08/23/13  POCT CBC      Result Value Ref Range   WBC 6.3  4.6 - 10.2 K/uL   Lymph, poc 1.7  0.6 - 3.4   POC LYMPH PERCENT 26.2  10 - 50 %L   MID (cbc) 0.5  0 - 0.9   POC MID % 7.3  0 - 12 %M   POC Granulocyte 4.2  2 - 6.9   Granulocyte percent 66.5  37 - 80 %G   RBC 4.00 (*) 4.69 - 6.13 M/uL   Hemoglobin 12.9 (*) 14.1 - 18.1 g/dL   HCT, POC 40.9 (*) 43.5 - 53.7 %   MCV 102.2 (*) 80 - 97 fL   MCH, POC 32.3 (*) 27 - 31.2 pg   MCHC 31.5 (*) 31.8 - 35.4 g/dL   RDW, POC 14.1     Platelet Count, POC 276  142 - 424 K/uL   MPV 7.6  0 - 99.8 fL  POCT URINALYSIS DIPSTICK      Result Value Ref Range   Color, UA brown     Clarity, UA cloudy     Glucose, UA neg     Bilirubin, UA small     Ketones, UA trace     Spec Grav, UA >=1.030     Blood, UA sma;;     pH, UA 5.5     Protein, UA 100     Urobilinogen, UA 1.0     Nitrite, UA positive     Leukocytes, UA large (3+)    GLUCOSE, POCT (MANUAL RESULT ENTRY)      Result Value Ref Range   POC Glucose 101 (*) 70 - 99 mg/dl   EKG:  Sinus brady at 52; no acute ST changes.  Assessment & Plan:     Dizziness - Plan: EKG 12-Lead, POCT CBC, POCT urinalysis dipstick, POCT glucose (manual entry), Comprehensive metabolic panel  Right sided  weakness - Plan: EKG 12-Lead  CAD (coronary artery disease)  S/P AAA repair  History of pontine CVA  Carotid stenosis  1. Dizziness;  New.  Associated with intermittent R sided weakness with weakness appreciated on exam in office; to ED for evaluation of TIA. 2.  R sided weakness:  New.  Associated with dizziness.  Short duration yet concerning for TIA. To ED for CT scan, STAT labs.  History of CVA and carotid stenosis; thus, high risk for acute event. 3.  Carotid stenosis: stable; increased risk of CVA. 4.  History of CVA: no R sided weakness associated with previous CVA; charts notes L sided weakness with previous CVA.  I personally performed the services described in this documentation, which was scribed in my presence. The recorded information has been reviewed and is accurate.    Reginia Forts, M.D.  [facility] [address] [place], [place]  [address] [phone] phone [phone] fax

## 2013-08-23 NOTE — Telephone Encounter (Signed)
MARY BATH WAYNE IS A THERAPIST AND IS SEEING PT. WANTED DR Avera TO KNOW SHE HAD TAKEN HIM FOR A WALK AND HE GOT REALLY DIZZY PLEASE CALL [phone]. WANTED HER TO KNOW SINCE HE IS A HEART PATIENT

## 2013-08-23 NOTE — Patient Instructions (Signed)
1. Go to ED for evaluation for evaluation of a mini-stroke.

## 2013-08-23 NOTE — ED Notes (Addendum)
Pt presents to department for evaluation of fatigue and weakness. States he was walking with PT around 12pm today and became weak on R side. Also states he felt lightheaded and dizzy. Upon arrival strong and equal bilateral grip strengths noted. No facial droop. Able to move all extremities without difficulty. Pt is alert and oriented x4.

## 2013-08-23 NOTE — Telephone Encounter (Signed)
Spoke to therapist. Pt had SOB and dizzy while doing his exercises. Pt has been ill the past few days. Advised her to have pt RTC. She is going to have him come in. He has been taking OTC allergy medication and cold medication for this. Since he is having the dizziness she is going to bring him in.

## 2013-08-23 NOTE — ED Provider Notes (Signed)
CSN: [id number]     Arrival date & time 08/23/13  1832 History   None    Chief Complaint  Patient presents with  . Weakness   HPI  Chris Payne is a 63 y.o. male with a PMH of MI, AAA s/p repair, HTN, hyperlipidemia, CAD s/p CABG, carotid stenosis, PAD, COPD, and GERD who presents to the ED for evaluation of weakness.  History was provided by the patient. Patient states that at 12 noon today he was walking at home with his physical therapist when he had sudden onset right arm and leg weakness. He states that this lasted about a minute and then resolved. He states he sat down and then suddenly became lightheaded and dizzy which lasted about a minute and resolved. He states about 30 minutes later he had another episode of dizziness and lightheadedness which resolved. Patient is currently asymptomatic. He denies any slurred speech, ataxia, loss of sensation, numbness, tingling, headache, chest pain, abdominal pain, nausea, vomiting, fevers, or change in appetite or activity. Patient complains of a chronic productive cough for the past 3-4 months. Sputum is white and yellow. No hemoptysis. He denies any shortness of breath. He has bilateral leg swelling which is his baseline. Patient went to an urgent care prior to arrival and was sent to the ED for further evaluation. Patient takes an 81 mg aspirin daily.      Past Medical History  Diagnosis Date  . Hypertension   . Stroke     h/o pontine CVA  . AAA (abdominal aortic aneurysm)     s/p repair 6/11  . Hyperlipidemia   . CAD (coronary artery disease)     s/p CABG 2/12:  LIMA to LAD, SVG to diagonal-1, SVG to ramus intermedius,, SVG to AM (Dr. Roxan Hockey) ;  b.  Myoview 10/13:  inf infarct with very mild peri-infarct ischemia, EF 49%, inf HK; c. Cath 2/25 showed 2/4 occluded grafts & high grade stenosis in the LAD distal to the LIMA insertion, not amenable to PCI - Med Rx  . Carotid stenosis     a.  dopplers 7/37: LICA 10-62%;  b. Carotid dopplers  3/14:  R 0-39%, L 60-79%, repeat 6 mos  . PAD (peripheral artery disease)     ABIs 4/12:  R 0.88, L 0.92; R SFA 40%, L CFA 50%, L SFA 50-60%  . COPD (chronic obstructive pulmonary disease)   . Inguinal hernia   . Thyroid nodule     incidental finding on carotid doppler 3/14 => thyroid U/S ordered  . Myocardial infarction   . Anginal pain     occ last 1-2 months ago  . Shortness of breath   . Pneumonia     hx  . GERD (gastroesophageal reflux disease)    Past Surgical History  Procedure Laterality Date  . Hip surgery  40 years ago    left hip bone removal and pinning  . Abdominal aortic aneurysm repair  10-03-2009  . Exploratory laparotomy  09/2009    ligation of lumbar arteries  . Coronary artery bypass graft      LIMA to the LAD, SVG to first diagonal, SVG to ramus intermediate, SVG to acute marginal. EF 50%. 2/12  . External ear surgery Left     laceration child  . Inguinal hernia repair Left 07/21/2012    Procedure: HERNIA REPAIR INGUINAL ADULT;  Surgeon: Merrie Roof, MD;  Location: [place];  Service: General;  Laterality: Left;  . Insertion of  mesh Left 07/21/2012    Procedure: INSERTION OF MESH;  Surgeon: Merrie Roof, MD;  Location: [facility];  Service: General;  Laterality: Left;   Family History  Problem Relation Age of Onset  . Alcohol abuse Father   . Cancer Sister    History  Substance Use Topics  . Smoking status: Former Smoker -- 1.00 packs/day for 30 years    Types: Cigarettes    Quit date: 01/25/2010  . Smokeless tobacco: Never Used  . Alcohol Use: No     Comment: former drinker    Review of Systems  Constitutional: Negative for fever, chills, diaphoresis, activity change, appetite change and fatigue.  HENT: Negative for congestion and sore throat.   Eyes: Negative for photophobia and visual disturbance.  Respiratory: Negative for cough and shortness of breath.   Gastrointestinal: Negative for nausea, vomiting, abdominal pain, diarrhea and constipation.   Genitourinary: Negative for dysuria.  Musculoskeletal: Negative for back pain, myalgias and neck pain.  Neurological: Positive for dizziness, weakness and light-headedness. Negative for syncope, facial asymmetry, speech difficulty, numbness and headaches.  Psychiatric/Behavioral: Negative for confusion.    Allergies  Review of patient's allergies indicates no known allergies.  Home Medications   Prior to Admission medications   Medication Sig Start Date End Date Taking? Authorizing Provider  amLODipine (NORVASC) 2.5 MG tablet Take 1 tablet (2.5 mg total) by mouth daily. 07/20/13  Yes Liliane Shi, PA-C  aspirin EC 81 MG tablet Take 81 mg by mouth daily.   Yes Historical Provider, MD  atorvastatin (LIPITOR) 80 MG tablet Take 1 tablet (80 mg total) by mouth daily. 07/05/13  Yes Tarri Fuller, PA-C  guaiFENesin (MUCINEX) 600 MG 12 hr tablet Take 600 mg by mouth 2 (two) times daily.   Yes Historical Provider, MD  HYDROcodone-acetaminophen (NORCO) 10-325 MG per tablet Take 2 tablets by mouth every 12 (twelve) hours as needed for moderate pain.    Yes Historical Provider, MD  isosorbide mononitrate (IMDUR) 120 MG 24 hr tablet Take 1 tablet (120 mg total) by mouth daily. 07/05/13  Yes Tarri Fuller, PA-C  metoprolol tartrate (LOPRESSOR) 25 MG tablet Take 0.5 tablets (12.5 mg total) by mouth 2 (two) times daily. 07/05/13  Yes Tarri Fuller, PA-C  morphine (MS CONTIN) 30 MG 12 hr tablet Take 30 mg by mouth every 12 (twelve) hours.  07/07/13  Yes Historical Provider, MD  nitroGLYCERIN (NITROSTAT) 0.4 MG SL tablet Place 0.4 mg under the tongue every 5 (five) minutes as needed for chest pain.  06/01/13  Yes Minus Breeding, MD  ranolazine (RANEXA) 1000 MG SR tablet Take 1 tablet (1,000 mg total) by mouth 2 (two) times daily. 07/05/13  Yes Tarri Fuller, PA-C  gabapentin (NEURONTIN) 300 MG capsule Take 1 capsule (300mg ) in the morning and 2 capsules (600mg ) at bedtime 07/21/13   Barton Fanny, MD   BP 127/107   Pulse 58  Temp(Src) 98.1 F (36.7 C) (Oral)  Resp 14  SpO2 94%  Filed Vitals:   08/23/13 2100 08/23/13 2115 08/23/13 2126 08/23/13 2230  BP: 119/75   120/74  Pulse: 52 65  57  Temp:   97.9 F (36.6 C) 97.8 F (36.6 C)  TempSrc:    Oral  Resp: 13 16  13   SpO2: 93% 94%  96%    Physical Exam  Nursing note and vitals reviewed. Constitutional: He is oriented to person, place, and time. He appears well-developed and well-nourished. No distress.  HENT:  Head: Normocephalic and  atraumatic.  Right Ear: External ear normal.  Left Ear: External ear normal.  Nose: Nose normal.  Mouth/Throat: Oropharynx is clear and moist. No oropharyngeal exudate.  Eyes: Conjunctivae and EOM are normal. Pupils are equal, round, and reactive to light. Right eye exhibits no discharge. Left eye exhibits no discharge.  Neck: Normal range of motion. Neck supple.  Cardiovascular: Normal rate, regular rhythm, normal heart sounds and intact distal pulses.  Exam reveals no gallop and no friction rub.   No murmur heard. Pulmonary/Chest: Effort normal and breath sounds normal. No respiratory distress. He has no wheezes. He has no rales. He exhibits no tenderness.  Productive cough. Crackles heard in the lung bases bilaterally.   Abdominal: Soft. He exhibits no distension and no mass. There is no tenderness. There is no rebound and no guarding.  Musculoskeletal: Normal range of motion. He exhibits no edema and no tenderness.  Strength 5/5 in the upper and lower extremities bilaterally. Pitting trace pedal edema bilaterally.   Neurological: He is alert and oriented to person, place, and time.  GCS 15.  No focal neurological deficits.  CN 2-12 intact.  No pronator drift.  Finger to nose intact.  Heel to shin intact.    Skin: Skin is warm and dry. He is not diaphoretic.    ED Course  Procedures (including critical care time) Labs Review Labs Reviewed  CBC WITH DIFFERENTIAL - Abnormal; Notable for the following:     RBC 4.10 (*)    All other components within normal limits  COMPREHENSIVE METABOLIC PANEL - Abnormal; Notable for the following:    Sodium 135 (*)    Albumin 3.1 (*)    GFR calc non Af Amer 88 (*)    All other components within normal limits    Imaging Review Dg Chest 2 View  08/23/2013   CLINICAL DATA:  Chest pain  EXAM: CHEST  2 VIEW  COMPARISON:  Prior radiograph from 07/21/2013  FINDINGS: Median sternotomy wires with underlying CABG markers again noted. Mild cardiomegaly is stable.  Emphysematous changes noted. No focal infiltrate, pulmonary edema, or pleural effusion. There is no pneumothorax.  No acute osseus abnormality. Multilevel degenerative changes noted within the visualized spine.  IMPRESSION: Emphysema.  No acute cardiopulmonary abnormality.   Electronically Signed   By: Jeannine Boga M.D.   On: 08/23/2013 23:07   Mr Angiogram Head Wo Contrast  08/23/2013   CLINICAL DATA:  Sudden onset of right arm and leg weakness. Now resolved.  EXAM: MRI HEAD WITHOUT CONTRAST  MRA HEAD WITHOUT CONTRAST  TECHNIQUE: Multiplanar, multiecho pulse sequences of the brain and surrounding structures were obtained without intravenous contrast. Angiographic images of the head were obtained using MRA technique without contrast.  COMPARISON:  Prior CT performed earlier on the same day as well as prior MRI from 10/10/2009.  FINDINGS: MRI HEAD FINDINGS  Diffuse prominence of the CSF containing spaces is compatible with generalized cerebral atrophy. Scattered and confluent T2/FLAIR hyperintensity within the periventricular and deep white matter both cerebral hemispheres is present, most likely related to moderate chronic microvascular ischemic disease. Cystic encephalomalacia within the bilateral pons are most compatible with remote pontine infarcts. Additional wedge-shaped area of encephalomalacia with associated gliosis within the anterior left frontal lobe is also consistent with remote infarct. Tiny remote  left cerebellar and right basal ganglia infarcts also noted.  No mass lesion, midline shift, or extra-axial fluid collection. Ventricles are normal in size without evidence of hydrocephalus.  No diffusion-weighted signal abnormality is identified  to suggest acute intracranial infarct. Gray-white matter differentiation is maintained. Normal flow voids are seen within the intracranial vasculature. No intracranial hemorrhage identified.  The cervicomedullary junction is normal. Pituitary gland is within normal limits. Pituitary stalk is midline. The globes and optic nerves demonstrate a normal appearance with normal signal intensity. The  The bone marrow signal intensity is normal. Calvarium is intact. Visualized upper cervical spine is within normal limits.  Scalp soft tissues are unremarkable.  Paranasal sinuses are clear.  No mastoid effusion.  MRA HEAD FINDINGS  The visualized portions of the distal cervical internal carotid arteries are widely patent with antegrade flow. The petrous, cavernous, and supra clinoid segments are symmetric in caliber bilaterally. A1 segments, anterior communicating artery, and anterior cerebral arteries are widely patent. Middle cerebral arteries are widely patent with antegrade flow without high-grade flow-limiting stenosis or proximal branch occlusion. Distal MCA branches are symmetric bilaterally. No intracranial aneurysm within the anterior circulation.  The distal right vertebral artery is small bowel with patent antegrade flow. The right vertebral artery is seen to the level of the right posterior inferior cerebellar artery. No flow within the right vertebral artery is seen distal to this point. Flow within the distal left vertebral artery is absent, and is likely occluded. No flow with thrombosis seen within the basilar artery. Thready flow was seen within the posterior cerebral arteries bilaterally, likely related to collateral circulation via the posterior communicating  arteries from the anterior circulation. The left posterior communicating artery appears larger than the right. Overall, these findings are not significantly changed relative to previous MRA from 10/10/2009.  IMPRESSION: MRI HEAD IMPRESSION:  1. No acute intracranial infarct or other abnormality identified within the brain. 2. Age-related atrophy with moderate chronic microvascular ischemic disease involving the supratentorial white matter, advanced relative to prior MRI from 10/10/2009. 3. Remote pontine, left frontal lobe, right basal ganglia, and left cerebellar infarcts.  MRA HEAD IMPRESSION:  1. Small but patent right vertebral artery to the level of the right PICA. 2. 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 There is flow within the posterior cerebral arteries bilaterally, likely via collateral circulation from the anterior circulation. Overall, these findings are not significantly changed relative to previous MRA from 10/10/2009.   Electronically Signed   By: Jeannine Boga M.D.   On: 08/23/2013 22:41   Mr Brain Wo Contrast  08/23/2013   CLINICAL DATA:  Sudden onset of right arm and leg weakness. Now resolved.  EXAM: MRI HEAD WITHOUT CONTRAST  MRA HEAD WITHOUT CONTRAST  TECHNIQUE: Multiplanar, multiecho pulse sequences of the brain and surrounding structures were obtained without intravenous contrast. Angiographic images of the head were obtained using MRA technique without contrast.  COMPARISON:  Prior CT performed earlier on the same day as well as prior MRI from 10/10/2009.  FINDINGS: MRI HEAD FINDINGS  Diffuse prominence of the CSF containing spaces is compatible with generalized cerebral atrophy. Scattered and confluent T2/FLAIR hyperintensity within the periventricular and deep white matter both cerebral hemispheres is present, most likely related to moderate chronic microvascular ischemic disease. Cystic encephalomalacia within the bilateral pons are  most compatible with remote pontine infarcts. Additional wedge-shaped area of encephalomalacia with associated gliosis within the anterior left frontal lobe is also consistent with remote infarct. Tiny remote left cerebellar and right basal ganglia infarcts also noted.  No mass lesion, midline shift, or extra-axial fluid collection. Ventricles are normal in size without evidence of hydrocephalus.  No diffusion-weighted signal abnormality  is identified to suggest acute intracranial infarct. Gray-white matter differentiation is maintained. Normal flow voids are seen within the intracranial vasculature. No intracranial hemorrhage identified.  The cervicomedullary junction is normal. Pituitary gland is within normal limits. Pituitary stalk is midline. The globes and optic nerves demonstrate a normal appearance with normal signal intensity. The  The bone marrow signal intensity is normal. Calvarium is intact. Visualized upper cervical spine is within normal limits.  Scalp soft tissues are unremarkable.  Paranasal sinuses are clear.  No mastoid effusion.  MRA HEAD FINDINGS  The visualized portions of the distal cervical internal carotid arteries are widely patent with antegrade flow. The petrous, cavernous, and supra clinoid segments are symmetric in caliber bilaterally. A1 segments, anterior communicating artery, and anterior cerebral arteries are widely patent. Middle cerebral arteries are widely patent with antegrade flow without high-grade flow-limiting stenosis or proximal branch occlusion. Distal MCA branches are symmetric bilaterally. No intracranial aneurysm within the anterior circulation.  The distal right vertebral artery is small bowel with patent antegrade flow. The right vertebral artery is seen to the level of the right posterior inferior cerebellar artery. No flow within the right vertebral artery is seen distal to this point. Flow within the distal left vertebral artery is absent, and is likely occluded.  No flow with thrombosis seen within the basilar artery. Thready flow was seen within the posterior cerebral arteries bilaterally, likely related to collateral circulation via the posterior communicating arteries from the anterior circulation. The left posterior communicating artery appears larger than the right. Overall, these findings are not significantly changed relative to previous MRA from 10/10/2009.  IMPRESSION: MRI HEAD IMPRESSION:  1. No acute intracranial infarct or other abnormality identified within the brain. 2. Age-related atrophy with moderate chronic microvascular ischemic disease involving the supratentorial white matter, advanced relative to prior MRI from 10/10/2009. 3. Remote pontine, left frontal lobe, right basal ganglia, and left cerebellar infarcts.  MRA HEAD IMPRESSION:  1. Small but patent right vertebral artery to the level of the right PICA. 2. 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 There is flow within the posterior cerebral arteries bilaterally, likely via collateral circulation from the anterior circulation. Overall, these findings are not significantly changed relative to previous MRA from 10/10/2009.   Electronically Signed   By: Jeannine Boga M.D.   On: 08/23/2013 22:41     EKG Interpretation None       Date: 08/23/2013  Rate: 57  Rhythm: normal sinus rhythm  QRS Axis: left  Intervals: normal  ST/T Wave abnormalities: normal  Conduction Disutrbances:none  Narrative Interpretation:   Old EKG Reviewed: unchanged   Results for orders placed during the hospital encounter of 08/23/13  CBC WITH DIFFERENTIAL      Result Value Ref Range   WBC 6.9  4.0 - 10.5 K/uL   RBC 4.10 (*) 4.22 - 5.81 MIL/uL   Hemoglobin 13.8  13.0 - 17.0 g/dL   HCT 40.8  39.0 - 52.0 %   MCV 99.5  78.0 - 100.0 fL   MCH 33.7  26.0 - 34.0 pg   MCHC 33.8  30.0 - 36.0 g/dL   RDW 13.3  11.5 - 15.5 %   Platelets 242  150 - 400 K/uL    Neutrophils Relative % 62  43 - 77 %   Neutro Abs 4.3  1.7 - 7.7 K/uL   Lymphocytes Relative 26  12 - 46 %   Lymphs Abs 1.8  0.7 -  4.0 K/uL   Monocytes Relative 11  3 - 12 %   Monocytes Absolute 0.8  0.1 - 1.0 K/uL   Eosinophils Relative 1  0 - 5 %   Eosinophils Absolute 0.1  0.0 - 0.7 K/uL   Basophils Relative 0  0 - 1 %   Basophils Absolute 0.0  0.0 - 0.1 K/uL  COMPREHENSIVE METABOLIC PANEL      Result Value Ref Range   Sodium 135 (*) 137 - 147 mEq/L   Potassium 4.6  3.7 - 5.3 mEq/L   Chloride 96  96 - 112 mEq/L   CO2 26  19 - 32 mEq/L   Glucose, Bld 97  70 - 99 mg/dL   BUN 18  6 - 23 mg/dL   Creatinine, Ser 0.89  0.50 - 1.35 mg/dL   Calcium 9.3  8.4 - 10.5 mg/dL   Total Protein 7.1  6.0 - 8.3 g/dL   Albumin 3.1 (*) 3.5 - 5.2 g/dL   AST 16  0 - 37 U/L   ALT 15  0 - 53 U/L   Alkaline Phosphatase 91  39 - 117 U/L   Total Bilirubin 0.6  0.3 - 1.2 mg/dL   GFR calc non Af Amer 88 (*) >90 mL/min   GFR calc Af Amer >90  >90 mL/min  TROPONIN I      Result Value Ref Range   Troponin I <0.30  <0.30 ng/mL  URINALYSIS, ROUTINE W REFLEX MICROSCOPIC      Result Value Ref Range   Color, Urine AMBER (*) YELLOW   APPearance TURBID (*) CLEAR   Specific Gravity, Urine 1.021  1.005 - 1.030   pH 6.0  5.0 - 8.0   Glucose, UA NEGATIVE  NEGATIVE mg/dL   Hgb urine dipstick TRACE (*) NEGATIVE   Bilirubin Urine SMALL (*) NEGATIVE   Ketones, ur NEGATIVE  NEGATIVE mg/dL   Protein, ur NEGATIVE  NEGATIVE mg/dL   Urobilinogen, UA 1.0  0.0 - 1.0 mg/dL   Nitrite POSITIVE (*) NEGATIVE   Leukocytes, UA LARGE (*) NEGATIVE  PROTIME-INR      Result Value Ref Range   Prothrombin Time 14.0  11.6 - 15.2 seconds   INR 1.10  0.00 - 1.49  APTT      Result Value Ref Range   aPTT 36  24 - 37 seconds  URINE MICROSCOPIC-ADD ON      Result Value Ref Range   Squamous Epithelial / LPF FEW (*) RARE   WBC, UA TOO NUMEROUS TO COUNT  <3 WBC/hpf   RBC / HPF 3-6  <3 RBC/hpf   Bacteria, UA MANY (*) RARE    MDM    Chris Payne is a 63 y.o. male with a PMH of MI, AAA s/p repair, HTN, hyperlipidemia, CAD s/p CABG, carotid stenosis, PAD, COPD, and GERD who presents to the ED for evaluation of weakness   Consults  9:15 PM = Spoke with Dr. Leonel Ramsay who states to admit to hospitalist. He will consult. Order MRA and MRI without contrast.   9:50 PM = Spoke with resident. Patient will be admitted under Dr. Ree Kida.     Patient will be admitted for evaluation and management of a possible TIA. No neurological deficits noted on exam. Patient incidentally found to have a urinary tract infection. Labs otherwise unremarkable. MRI is pending. Patient will be admitted to hospitalist with neurology consulting.  Final impressions: 1. TIA (transient ischemic attack)   2. Urinary tract infection  Harold Hedge [place], PA-C 08/23/13 2340

## 2013-08-23 NOTE — H&P (Signed)
[facility] Admission History and Physical Service Pager: [phone]  Patient name: Chris Payne Medical record number: [id number] Date of birth: Sep 15, 1950 Age: 63 y.o. Gender: male  Primary Care Provider: Ellsworth Lennox, MD Consultants: Neurology Code Status: Full code  Chief Complaint: Right leg weakness  Assessment and Plan: Chris Payne is a 63 y.o. male presenting with transient right leg weakness . PMH is significant for CAD, HTN, PAD, AAA, carotid stenosis, CVA  # Transient right sided muscle weakness: patient has significant vascular disease. Has previous carotid dopplers showing 81-01% occulusion of LICA in March 7510. Last echo showed severe hypokinesis of apex and EF of 50-55% (10/05/2013). MRI shows no acute pathology. Patient states he is at baseline. Clonus-type movements and hyperreflexia.   Place in observation, telemetry, attending Dr. Ree Kida  Cardiac monitoring  Follow-up neurology recommendations  LICA currently within range for intervention with CEA. May consider consulting vascular surgery inpatient vs outpatient follow-up  # CAD with history of CVA and MI  Continue home atorvastatin, aspirin  Continue Imdur and Ranexa  # UTI: patient's urinalysis suggestive of infection. Has dysuria symptoms as well. No frequency, fevers, no CVA tenderness  Ceftriaxone 1g q24 while inpatient  # Hypertension  Continue home amlodipine and metoprolol  # Bradycardia; patient in high 50s in ED. Asymptomatic. Currently on metoprolol as a home medication. Pulse of 66 while on the floor.  Will continue metoprolol and watch pulse  Patient will be on tele  # Chronic back pain  Continue MS contin 30mg  BID  Morphine 2mg  q2 PRN  FEN/GI: Heart healthy, saline lock Prophylaxis: Heparin subq  Disposition: Place in telemetry for observation  History of Present Illness: Chris Payne is a 63 y.o. male presenting with right sided  weakness which has resolved.  Patient reports being outside today working with his physical therapist and walking back home when he had sudden weakness on his right side with associated lightheadedness and blurry vision which lasted about 30 seconds. Thirty minutes later, he had a similar episode which also lasted about 30 seconds and completely resolved. Patient went to urgent care who sent him to the ED.  In the ED, Neurology was consulted and recommended patient be admitted for TIA work up. MRI and MRA obtained.  Review Of Systems: Per HPI with the following additions: Dysuria Otherwise 12 point review of systems was performed and was unremarkable.  Patient Active Problem List   Diagnosis Date Noted  . TIA (transient ischemic attack) 08/23/2013  . HCAP (healthcare-associated pneumonia) 07/10/2013  . Other and unspecified angina pectoris 07/04/2013  . Bilateral leg pain 10/31/2012  . Nontoxic multinodular goiter 10/07/2012  . Unstable angina 06/19/2012  . Carotid stenosis   . PAD (peripheral artery disease)   . Left inguinal hernia 05/30/2012  . COPD (chronic obstructive pulmonary disease) 08/24/2011  . S/P AAA repair 08/24/2011  . Lumbar disc disease   . Hypertensive heart disease   . Hyperlipidemia   . CAD (coronary artery disease)   . History of pontine CVA   . GERD (gastroesophageal reflux disease)    Past Medical History: Past Medical History  Diagnosis Date  . Hypertension   . Stroke     h/o pontine CVA  . AAA (abdominal aortic aneurysm)     s/p repair 6/11  . Hyperlipidemia   . CAD (coronary artery disease)     s/p CABG 2/12:  LIMA to LAD, SVG to diagonal-1, SVG to ramus intermedius,, SVG to AM (Dr.  Hendrickson) ;  b.  Myoview 10/13:  inf infarct with very mild peri-infarct ischemia, EF 49%, inf HK; c. Cath 2/25 showed 2/4 occluded grafts & high grade stenosis in the LAD distal to the LIMA insertion, not amenable to PCI - Med Rx  . Carotid stenosis     a.  dopplers  8/24: LICA 23-53%;  b. Carotid dopplers 3/14:  R 0-39%, L 60-79%, repeat 6 mos  . PAD (peripheral artery disease)     ABIs 4/12:  R 0.88, L 0.92; R SFA 40%, L CFA 50%, L SFA 50-60%  . COPD (chronic obstructive pulmonary disease)   . Inguinal hernia   . Thyroid nodule     incidental finding on carotid doppler 3/14 => thyroid U/S ordered  . Myocardial infarction   . Anginal pain     occ last 1-2 months ago  . Shortness of breath   . Pneumonia     hx  . GERD (gastroesophageal reflux disease)    Past Surgical History: Past Surgical History  Procedure Laterality Date  . Hip surgery  40 years ago    left hip bone removal and pinning  . Abdominal aortic aneurysm repair  10-03-2009  . Exploratory laparotomy  09/2009    ligation of lumbar arteries  . Coronary artery bypass graft      LIMA to the LAD, SVG to first diagonal, SVG to ramus intermediate, SVG to acute marginal. EF 50%. 2/12  . External ear surgery Left     laceration child  . Inguinal hernia repair Left 07/21/2012    Procedure: HERNIA REPAIR INGUINAL ADULT;  Surgeon: Merrie Roof, MD;  Location: [facility];  Service: General;  Laterality: Left;  . Insertion of mesh Left 07/21/2012    Procedure: INSERTION OF MESH;  Surgeon: Merrie Roof, MD;  Location: [place];  Service: General;  Laterality: Left;   Social History: History  Substance Use Topics  . Smoking status: Former Smoker -- 1.00 packs/day for 30 years    Types: Cigarettes    Quit date: 01/25/2010  . Smokeless tobacco: Never Used  . Alcohol Use: No     Comment: former drinker   Additional social history: None  Please also refer to relevant sections of EMR.  Family History: Family History  Problem Relation Age of Onset  . Alcohol abuse Father   . Cancer Sister    Allergies and Medications: No Known Allergies No current facility-administered medications on file prior to encounter.   Current Outpatient Prescriptions on File Prior to Encounter  Medication Sig  Dispense Refill  . amLODipine (NORVASC) 2.5 MG tablet Take 1 tablet (2.5 mg total) by mouth daily.  30 tablet  11  . aspirin EC 81 MG tablet Take 81 mg by mouth daily.      Marland Kitchen atorvastatin (LIPITOR) 80 MG tablet Take 1 tablet (80 mg total) by mouth daily.  30 tablet  5  . HYDROcodone-acetaminophen (NORCO) 10-325 MG per tablet Take 2 tablets by mouth every 12 (twelve) hours as needed for moderate pain.       . isosorbide mononitrate (IMDUR) 120 MG 24 hr tablet Take 1 tablet (120 mg total) by mouth daily.  30 tablet  5  . metoprolol tartrate (LOPRESSOR) 25 MG tablet Take 0.5 tablets (12.5 mg total) by mouth 2 (two) times daily.  60 tablet  5  . morphine (MS CONTIN) 30 MG 12 hr tablet Take 30 mg by mouth every 12 (twelve) hours.       Marland Kitchen  nitroGLYCERIN (NITROSTAT) 0.4 MG SL tablet Place 0.4 mg under the tongue every 5 (five) minutes as needed for chest pain.       . ranolazine (RANEXA) 1000 MG SR tablet Take 1 tablet (1,000 mg total) by mouth 2 (two) times daily.  60 tablet  5  . gabapentin (NEURONTIN) 300 MG capsule Take 1 capsule (300mg ) in the morning and 2 capsules (600mg ) at bedtime  90 capsule  3    Objective: BP 132/66  Pulse 66  Temp(Src) 97.5 F (36.4 C) (Oral)  Resp 16  Ht 5\' 7"  (1.702 m)  Wt 165 lb 1.6 oz (74.889 kg)  BMI 25.85 kg/m2  SpO2 96%  Exam: General: Laying in bed, in no acute distres HEENT: PERRL, EMOI, moist mucous membranes Cardiovascular: Regular rate rhythm, 2+ radial pulses and 1+ pedal pulses Respiratory: Coarse breath soundsbilatearlly,  Abdomen: Soft, non-tender, non-distended Extremities: Cool extremities, slight erythema at tips of toes Skin: well perfused Neuro: Alert, oriented x3, no cranial nerve deficit, 5/5 upper and LE strength, patient has tremor when has intentional movements of legs bilaterally, clonus-type movements of bilateral feet on examination, reflexes 3+ BL.  Labs and Imaging: CBC BMET   Recent Labs Lab 08/23/13 1918  WBC 6.9  HGB  13.8  HCT 40.8  PLT 242    Recent Labs Lab 08/23/13 1918  NA 135*  K 4.6  CL 96  CO2 26  BUN 18  CREATININE 0.89  GLUCOSE 97  CALCIUM 9.3     Urinalysis    Component Value Date/Time   COLORURINE AMBER* 08/23/2013 2115   APPEARANCEUR TURBID* 08/23/2013 2115   LABSPEC 1.021 08/23/2013 2115   PHURINE 6.0 08/23/2013 2115   GLUCOSEU NEGATIVE 08/23/2013 2115   HGBUR TRACE* 08/23/2013 2115   BILIRUBINUR SMALL* 08/23/2013 2115   BILIRUBINUR small 08/23/2013 1736   KETONESUR NEGATIVE 08/23/2013 2115   PROTEINUR NEGATIVE 08/23/2013 2115   PROTEINUR 100 08/23/2013 1736   UROBILINOGEN 1.0 08/23/2013 2115   UROBILINOGEN 1.0 08/23/2013 1736   NITRITE POSITIVE* 08/23/2013 2115   NITRITE positive 08/23/2013 1736   LEUKOCYTESUR LARGE* 08/23/2013 2115    Dg Chest 2 View  08/23/2013   CLINICAL DATA:  Chest pain  EXAM: CHEST  2 VIEW  COMPARISON:  Prior radiograph from 07/21/2013  FINDINGS: Median sternotomy wires with underlying CABG markers again noted. Mild cardiomegaly is stable.  Emphysematous changes noted. No focal infiltrate, pulmonary edema, or pleural effusion. There is no pneumothorax.  No acute osseus abnormality. Multilevel degenerative changes noted within the visualized spine.  IMPRESSION: Emphysema.  No acute cardiopulmonary abnormality.   Electronically Signed   By: Jeannine Boga M.D.   On: 08/23/2013 23:07   Mr Angiogram Head Wo Contrast  08/23/2013   CLINICAL DATA:  Sudden onset of right arm and leg weakness. Now resolved.  EXAM: MRI HEAD WITHOUT CONTRAST  MRA HEAD WITHOUT CONTRAST  TECHNIQUE: Multiplanar, multiecho pulse sequences of the brain and surrounding structures were obtained without intravenous contrast. Angiographic images of the head were obtained using MRA technique without contrast.  COMPARISON:  Prior CT performed earlier on the same day as well as prior MRI from 10/10/2009.  FINDINGS: MRI HEAD FINDINGS  Diffuse prominence of the CSF containing spaces is compatible with  generalized cerebral atrophy. Scattered and confluent T2/FLAIR hyperintensity within the periventricular and deep white matter both cerebral hemispheres is present, most likely related to moderate chronic microvascular ischemic disease. Cystic encephalomalacia within the bilateral pons are most compatible with remote pontine infarcts.  Additional wedge-shaped area of encephalomalacia with associated gliosis within the anterior left frontal lobe is also consistent with remote infarct. Tiny remote left cerebellar and right basal ganglia infarcts also noted.  No mass lesion, midline shift, or extra-axial fluid collection. Ventricles are normal in size without evidence of hydrocephalus.  No diffusion-weighted signal abnormality is identified to suggest acute intracranial infarct. Gray-white matter differentiation is maintained. Normal flow voids are seen within the intracranial vasculature. No intracranial hemorrhage identified.  The cervicomedullary junction is normal. Pituitary gland is within normal limits. Pituitary stalk is midline. The globes and optic nerves demonstrate a normal appearance with normal signal intensity. The  The bone marrow signal intensity is normal. Calvarium is intact. Visualized upper cervical spine is within normal limits.  Scalp soft tissues are unremarkable.  Paranasal sinuses are clear.  No mastoid effusion.  MRA HEAD FINDINGS  The visualized portions of the distal cervical internal carotid arteries are widely patent with antegrade flow. The petrous, cavernous, and supra clinoid segments are symmetric in caliber bilaterally. A1 segments, anterior communicating artery, and anterior cerebral arteries are widely patent. Middle cerebral arteries are widely patent with antegrade flow without high-grade flow-limiting stenosis or proximal branch occlusion. Distal MCA branches are symmetric bilaterally. No intracranial aneurysm within the anterior circulation.  The distal right vertebral artery is  small bowel with patent antegrade flow. The right vertebral artery is seen to the level of the right posterior inferior cerebellar artery. No flow within the right vertebral artery is seen distal to this point. Flow within the distal left vertebral artery is absent, and is likely occluded. No flow with thrombosis seen within the basilar artery. Thready flow was seen within the posterior cerebral arteries bilaterally, likely related to collateral circulation via the posterior communicating arteries from the anterior circulation. The left posterior communicating artery appears larger than the right. Overall, these findings are not significantly changed relative to previous MRA from 10/10/2009.  IMPRESSION: MRI HEAD IMPRESSION:  1. No acute intracranial infarct or other abnormality identified within the brain. 2. Age-related atrophy with moderate chronic microvascular ischemic disease involving the supratentorial white matter, advanced relative to prior MRI from 10/10/2009. 3. Remote pontine, left frontal lobe, right basal ganglia, and left cerebellar infarcts.  MRA HEAD IMPRESSION:  1. Small but patent right vertebral artery to the level of the right PICA. 2. 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 There is flow within the posterior cerebral arteries bilaterally, likely via collateral circulation from the anterior circulation. Overall, these findings are not significantly changed relative to previous MRA from 10/10/2009.   Electronically Signed   By: Jeannine Boga M.D.   On: 08/23/2013 22:41   Mr Brain Wo Contrast  08/23/2013   CLINICAL DATA:  Sudden onset of right arm and leg weakness. Now resolved.  EXAM: MRI HEAD WITHOUT CONTRAST  MRA HEAD WITHOUT CONTRAST  TECHNIQUE: Multiplanar, multiecho pulse sequences of the brain and surrounding structures were obtained without intravenous contrast. Angiographic images of the head were obtained using MRA technique without  contrast.  COMPARISON:  Prior CT performed earlier on the same day as well as prior MRI from 10/10/2009.  FINDINGS: MRI HEAD FINDINGS  Diffuse prominence of the CSF containing spaces is compatible with generalized cerebral atrophy. Scattered and confluent T2/FLAIR hyperintensity within the periventricular and deep white matter both cerebral hemispheres is present, most likely related to moderate chronic microvascular ischemic disease. Cystic encephalomalacia within the bilateral pons are most compatible with remote  pontine infarcts. Additional wedge-shaped area of encephalomalacia with associated gliosis within the anterior left frontal lobe is also consistent with remote infarct. Tiny remote left cerebellar and right basal ganglia infarcts also noted.  No mass lesion, midline shift, or extra-axial fluid collection. Ventricles are normal in size without evidence of hydrocephalus.  No diffusion-weighted signal abnormality is identified to suggest acute intracranial infarct. Gray-white matter differentiation is maintained. Normal flow voids are seen within the intracranial vasculature. No intracranial hemorrhage identified.  The cervicomedullary junction is normal. Pituitary gland is within normal limits. Pituitary stalk is midline. The globes and optic nerves demonstrate a normal appearance with normal signal intensity. The  The bone marrow signal intensity is normal. Calvarium is intact. Visualized upper cervical spine is within normal limits.  Scalp soft tissues are unremarkable.  Paranasal sinuses are clear.  No mastoid effusion.  MRA HEAD FINDINGS  The visualized portions of the distal cervical internal carotid arteries are widely patent with antegrade flow. The petrous, cavernous, and supra clinoid segments are symmetric in caliber bilaterally. A1 segments, anterior communicating artery, and anterior cerebral arteries are widely patent. Middle cerebral arteries are widely patent with antegrade flow without  high-grade flow-limiting stenosis or proximal branch occlusion. Distal MCA branches are symmetric bilaterally. No intracranial aneurysm within the anterior circulation.  The distal right vertebral artery is small bowel with patent antegrade flow. The right vertebral artery is seen to the level of the right posterior inferior cerebellar artery. No flow within the right vertebral artery is seen distal to this point. Flow within the distal left vertebral artery is absent, and is likely occluded. No flow with thrombosis seen within the basilar artery. Thready flow was seen within the posterior cerebral arteries bilaterally, likely related to collateral circulation via the posterior communicating arteries from the anterior circulation. The left posterior communicating artery appears larger than the right. Overall, these findings are not significantly changed relative to previous MRA from 10/10/2009.  IMPRESSION: MRI HEAD IMPRESSION:  1. No acute intracranial infarct or other abnormality identified within the brain. 2. Age-related atrophy with moderate chronic microvascular ischemic disease involving the supratentorial white matter, advanced relative to prior MRI from 10/10/2009. 3. Remote pontine, left frontal lobe, right basal ganglia, and left cerebellar infarcts.  MRA HEAD IMPRESSION:  1. Small but patent right vertebral artery to the level of the right PICA. 2. 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 There is flow within the posterior cerebral arteries bilaterally, likely via collateral circulation from the anterior circulation. Overall, these findings are not significantly changed relative to previous MRA from 10/10/2009.   Electronically Signed   By: Jeannine Boga M.D.   On: 08/23/2013 22:41    Cordelia Poche, MD 08/24/2013, 12:21 AM PGY-1, [place] Intern pager: [phone], text pages welcome   Teaching Service Addendum. I have seen and  evaluated this pt and agree with Dr. Lisbeth Ply assessment and plan as it is documented on this note.   D. [place] Philippa Sicks, MD Family Medicine  PGY-3

## 2013-08-24 ENCOUNTER — Inpatient Hospital Stay (HOSPITAL_COMMUNITY): Payer: Medicare HMO

## 2013-08-24 DIAGNOSIS — M79609 Pain in unspecified limb: Secondary | ICD-10-CM

## 2013-08-24 DIAGNOSIS — I739 Peripheral vascular disease, unspecified: Secondary | ICD-10-CM

## 2013-08-24 DIAGNOSIS — E785 Hyperlipidemia, unspecified: Secondary | ICD-10-CM

## 2013-08-24 DIAGNOSIS — J449 Chronic obstructive pulmonary disease, unspecified: Secondary | ICD-10-CM

## 2013-08-24 DIAGNOSIS — N390 Urinary tract infection, site not specified: Secondary | ICD-10-CM

## 2013-08-24 DIAGNOSIS — G459 Transient cerebral ischemic attack, unspecified: Secondary | ICD-10-CM

## 2013-08-24 DIAGNOSIS — R5381 Other malaise: Secondary | ICD-10-CM

## 2013-08-24 DIAGNOSIS — I119 Hypertensive heart disease without heart failure: Secondary | ICD-10-CM

## 2013-08-24 DIAGNOSIS — I2510 Atherosclerotic heart disease of native coronary artery without angina pectoris: Secondary | ICD-10-CM

## 2013-08-24 DIAGNOSIS — R259 Unspecified abnormal involuntary movements: Secondary | ICD-10-CM

## 2013-08-24 DIAGNOSIS — J4489 Other specified chronic obstructive pulmonary disease: Secondary | ICD-10-CM

## 2013-08-24 DIAGNOSIS — I6529 Occlusion and stenosis of unspecified carotid artery: Secondary | ICD-10-CM

## 2013-08-24 DIAGNOSIS — R292 Abnormal reflex: Secondary | ICD-10-CM

## 2013-08-24 DIAGNOSIS — R5383 Other fatigue: Secondary | ICD-10-CM

## 2013-08-24 DIAGNOSIS — Z8673 Personal history of transient ischemic attack (TIA), and cerebral infarction without residual deficits: Secondary | ICD-10-CM

## 2013-08-24 DIAGNOSIS — R42 Dizziness and giddiness: Secondary | ICD-10-CM

## 2013-08-24 LAB — COMPREHENSIVE METABOLIC PANEL
ALBUMIN: 3.9 g/dL (ref 3.5–5.2)
ALK PHOS: 90 U/L (ref 39–117)
ALT: 15 U/L (ref 0–53)
AST: 15 U/L (ref 0–37)
BILIRUBIN TOTAL: 0.8 mg/dL (ref 0.2–1.2)
BUN: 16 mg/dL (ref 6–23)
CO2: 28 meq/L (ref 19–32)
Calcium: 9.6 mg/dL (ref 8.4–10.5)
Chloride: 98 mEq/L (ref 96–112)
Creat: 0.86 mg/dL (ref 0.50–1.35)
GLUCOSE: 95 mg/dL (ref 70–99)
Potassium: 5 mEq/L (ref 3.5–5.3)
SODIUM: 136 meq/L (ref 135–145)
TOTAL PROTEIN: 7 g/dL (ref 6.0–8.3)

## 2013-08-24 LAB — TSH: TSH: 1.68 u[IU]/mL (ref 0.350–4.500)

## 2013-08-24 LAB — VITAMIN B12: Vitamin B-12: 629 pg/mL (ref 211–911)

## 2013-08-24 LAB — T4, FREE: Free T4: 1.2 ng/dL (ref 0.80–1.80)

## 2013-08-24 LAB — HIV ANTIBODY (ROUTINE TESTING W REFLEX): HIV 1&2 Ab, 4th Generation: NONREACTIVE

## 2013-08-24 LAB — RPR

## 2013-08-24 MED ORDER — SODIUM CHLORIDE 0.9 % IJ SOLN
3.0000 mL | Freq: Two times a day (BID) | INTRAMUSCULAR | Status: DC
  Administered 2013-08-25 – 2013-08-26 (×2): 3 mL via INTRAVENOUS

## 2013-08-24 MED ORDER — PERFLUTREN LIPID MICROSPHERE
INTRAVENOUS | Status: AC
  Filled 2013-08-24: qty 10

## 2013-08-24 MED ORDER — POLYETHYLENE GLYCOL 3350 17 G PO PACK
17.0000 g | PACK | Freq: Every day | ORAL | Status: DC | PRN
  Administered 2013-08-24: 17 g via ORAL
  Filled 2013-08-24: qty 1

## 2013-08-24 MED ORDER — METOPROLOL TARTRATE 12.5 MG HALF TABLET
12.5000 mg | ORAL_TABLET | Freq: Two times a day (BID) | ORAL | Status: DC
  Administered 2013-08-24: 12.5 mg via ORAL
  Filled 2013-08-24 (×4): qty 1

## 2013-08-24 MED ORDER — ISOSORBIDE MONONITRATE ER 60 MG PO TB24
120.0000 mg | ORAL_TABLET | Freq: Every day | ORAL | Status: DC
  Administered 2013-08-26: 120 mg via ORAL
  Filled 2013-08-24 (×3): qty 2

## 2013-08-24 MED ORDER — MORPHINE SULFATE ER 15 MG PO TBCR
30.0000 mg | EXTENDED_RELEASE_TABLET | Freq: Two times a day (BID) | ORAL | Status: DC
  Administered 2013-08-24 – 2013-08-26 (×6): 30 mg via ORAL
  Filled 2013-08-24 (×6): qty 2

## 2013-08-24 MED ORDER — SODIUM CHLORIDE 0.9 % IJ SOLN: 3.0000 mL | INTRAMUSCULAR | Status: DC | PRN

## 2013-08-24 MED ORDER — RANOLAZINE ER 500 MG PO TB12
1000.0000 mg | ORAL_TABLET | Freq: Two times a day (BID) | ORAL | Status: DC
  Administered 2013-08-24 – 2013-08-26 (×5): 1000 mg via ORAL
  Filled 2013-08-24 (×8): qty 2

## 2013-08-24 MED ORDER — SODIUM CHLORIDE 0.9 % IV SOLN: 250.0000 mL | INTRAVENOUS | Status: DC | PRN

## 2013-08-24 MED ORDER — MORPHINE SULFATE 2 MG/ML IJ SOLN: 2.0000 mg | INTRAMUSCULAR | Status: DC | PRN

## 2013-08-24 MED ORDER — SODIUM CHLORIDE 0.9 % IJ SOLN
3.0000 mL | Freq: Two times a day (BID) | INTRAMUSCULAR | Status: DC
  Administered 2013-08-24 – 2013-08-26 (×6): 3 mL via INTRAVENOUS

## 2013-08-24 MED ORDER — PERFLUTREN LIPID MICROSPHERE
1.0000 mL | INTRAVENOUS | Status: AC | PRN
  Administered 2013-08-24: 7 mL via INTRAVENOUS
  Filled 2013-08-24: qty 10

## 2013-08-24 MED ORDER — ATORVASTATIN CALCIUM 80 MG PO TABS
80.0000 mg | ORAL_TABLET | Freq: Every day | ORAL | Status: DC
  Administered 2013-08-24 – 2013-08-26 (×3): 80 mg via ORAL
  Filled 2013-08-24 (×3): qty 1

## 2013-08-24 MED ORDER — ASPIRIN EC 81 MG PO TBEC
81.0000 mg | DELAYED_RELEASE_TABLET | Freq: Every day | ORAL | Status: DC
  Administered 2013-08-24 – 2013-08-26 (×3): 81 mg via ORAL
  Filled 2013-08-24 (×3): qty 1

## 2013-08-24 MED ORDER — DEXTROSE 5 % IV SOLN
1.0000 g | INTRAVENOUS | Status: DC
  Administered 2013-08-24 – 2013-08-26 (×3): 1 g via INTRAVENOUS
  Filled 2013-08-24 (×4): qty 10

## 2013-08-24 MED ORDER — AMLODIPINE BESYLATE 2.5 MG PO TABS
2.5000 mg | ORAL_TABLET | Freq: Every day | ORAL | Status: DC
Start: 2013-08-24 — End: 2013-08-26
  Administered 2013-08-26: 2.5 mg via ORAL
  Filled 2013-08-24 (×3): qty 1

## 2013-08-24 MED ORDER — HEPARIN SODIUM (PORCINE) 5000 UNIT/ML IJ SOLN
5000.0000 [IU] | Freq: Three times a day (TID) | INTRAMUSCULAR | Status: DC
  Administered 2013-08-24 – 2013-08-26 (×8): 5000 [IU] via SUBCUTANEOUS
  Filled 2013-08-24 (×10): qty 1

## 2013-08-24 NOTE — Progress Notes (Signed)
Family Medicine Teaching Service Daily Progress Note Intern Pager: [phone]  Patient name: Chris Payne Medical record number: [id number] Date of birth: 05/25/1950 Age: 63 y.o. Gender: male  Primary Care Provider: Ellsworth Lennox, MD Consultants: neuro Code Status: full  Pt Overview and Major Events to Date:   Assessment and Plan: Chris Payne is a 63 y.o. male presenting with transient right leg weakness . PMH is significant for CAD, HTN, PAD, AAA, carotid stenosis, CVA   # Neuro:Transient right sided muscle weakness: patient has significant vascular disease. Has previous carotid dopplers showing 66-06% occulusion of LICA in March 3016. Last echo showed severe hypokinesis of apex and EF of 50-55% (10/05/2013). MRI shows no acute pathology.However gives a report of recent falls, worsening lower extremity tremors in last 2 months 3 weeks ago some bladder hesitancy. Physical exam notable for multiple beats of clonus bilaterally and hyperreflexia. Concerning for UMN lesion (transverse myelitis, MS, ALS, demyelinating d/o) vs vitamin deficiency vs infectious etiology (HIV/syphilis)  -Cardiac monitoring  -Follow-up neurology recommendations, would like B12, C/T spine films -will additionally obtain Lumbar spine given intermittent hx of lower back pain and need for cath -B12, TSH, RPR, HIV -orthostatic vitals  # CV: CAD with history of CVA and MI/ Hypertension Currently on metoprolol as a home medication, given above. HR 50s-60s -Continue home aspirin, switch ator to crestor  -Continue Imdur and Ranexa -LICA currently within range for intervention with CEA. May consider consulting vascular surgery inpatient vs outpatient follow-up -Continue home amlodipine and metoprolol -monitor for events on tele, consider cards consult if conts to be bradycardic  # Uro: UTI: patient's urinalysis suggestive of infection. Has dysuria symptoms as well. No frequency, fevers, no CVA tenderness   -Ceftriaxone 1g q24 while inpatient -add on urine culture  # MSK: Chronic back pain  -Continue MS contin 30mg  BID  -Morphine 2mg  q2 PRN  FEN/GI: Heart healthy, saline lock  Prophylaxis: Heparin subq  Disposition: further neurology imaging; w/up of clonus, weakness and falls  Subjective:  Feels better this morning; no longer having dizziness; is worries about the loud noises of the MRI but does not wish to be medicated   Objective: Temp:  [97.5 F (36.4 C)-98.7 F (37.1 C)] 98.7 F (37.1 C) (04/30 0510) Pulse Rate:  [52-66] 55 (04/30 0510) Resp:  [12-20] 16 (04/30 0510) BP: (111-143)/(66-107) 125/76 mmHg (04/30 0510) SpO2:  [92 %-98 %] 93 % (04/30 0510) Weight:  [156 lb 8 oz (70.988 kg)-165 lb 1.6 oz (74.889 kg)] 165 lb 1.6 oz (74.889 kg) (04/30 0000) Physical Exam: General: Sitting up in bed, in no acute distres  HEENT: PERRL, EMOI, moist mucous membranes  Cardiovascular: Regular rate rhythm, 2+ radial pulses and 1+ pedal pulses (left foot with 2+ pitting edema) Respiratory: Coarse breath sounds bilatearlly, poor air movement; chronic cough Abdomen: Soft, non-tender, non-distended  Extremities: Cool extremities, slight erythema at tips of toes  Skin: multiple seborrheic lesions throughout  Neuro: Alert, oriented x3, no cranial nerve deficit, 5/5 upper strength, strength 5/5 in bilateral upper extremities, right hip flexion 3/5, left hip flexion 4/5, bilateral dorsi/planter flexion 4+/5; patient has tremor with intentional movements of legs bilaterally, sustained clonus at bilateral ankles, reflexes 3+ BL most notable at the brachioradialis  Laboratory:  Recent Labs Lab 08/23/13 1736 08/23/13 1918  WBC 6.3 6.9  HGB 12.9* 13.8  HCT 40.9* 40.8  PLT  --  242    Recent Labs Lab 08/23/13 1918  NA 135*  K 4.6  CL 96  CO2 26  BUN 18  CREATININE 0.89  CALCIUM 9.3  PROT 7.1  BILITOT 0.6  ALKPHOS 91  ALT 15  AST 16  GLUCOSE 97    Imaging/Diagnostic  Tests: IMPRESSION: MRI HEAD IMPRESSION: 1. No acute intracranial infarct or other abnormality identified within the brain. 2. Age-related atrophy with moderate chronic microvascular ischemic disease involving the supratentorial white matter, advanced relative to prior MRI from 10/10/2009. 3. Remote pontine, left frontal lobe, right basal ganglia, and left cerebellar infarcts.   MRA HEAD IMPRESSION: 1. Small but patent right vertebral artery to the level of the right PICA. 2. 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 There is flow within the posterior cerebral arteries bilaterally, likely via collateral circulation from the anterior circulation. Overall, these findings are not significantly changed relative to previous MRA from 10/10/2009  CXR 08/23/13 IMPRESSION: Emphysema. No acute cardiopulmonary abnormality  Langston Masker, MD 08/24/2013, 7:43 AM PGY-1, [place] Intern pager: [phone], text pages welcome

## 2013-08-24 NOTE — Progress Notes (Signed)
FMTS ATTENDING  NOTE Sheli Dorin,MD I have discussed this patient with the resident. I agree with the resident's findings, assessment and care plan.   

## 2013-08-24 NOTE — Consult Note (Addendum)
Neurology Consultation Reason for Consult: Right leg weakness Referring Physician: Vernie Murders  CC: Right leg weakness  History is obtained from: Patient  HPI: Chris Payne is a 63 y.o. male with a history of stroke who presents with right leg weakness that lasted for 30-45 seconds. He states that he has had progressive trouble walking over the past year due to lower back pain. He gets injections in his spine in the pain clinic in [place]. He states that the difficulty walking has not been significantly different over the past few months, but has noticed that he has been having abnormal movements of his lower extremities for the past month. He also had an episode of bilateral lower extremity giving out what happened approximately one month ago.  He presented today due to the transient right leg weakness as well as 2 episodes of lightheadedness. His symptoms have since resolved. He states that he has never had MRIs of his back.  He endorses occasional constipation, possibly some difficulties with urine over the past 2 weeks.   ROS: A 14 point ROS was performed and is negative except as noted in the HPI.  Past Medical History  Diagnosis Date  . Hypertension   . Stroke     h/o pontine CVA  . AAA (abdominal aortic aneurysm)     s/p repair 6/11  . Hyperlipidemia   . CAD (coronary artery disease)     s/p CABG 2/12:  LIMA to LAD, SVG to diagonal-1, SVG to ramus intermedius,, SVG to AM (Dr. Roxan Hockey) ;  b.  Myoview 10/13:  inf infarct with very mild peri-infarct ischemia, EF 49%, inf HK; c. Cath 2/25 showed 2/4 occluded grafts & high grade stenosis in the LAD distal to the LIMA insertion, not amenable to PCI - Med Rx  . Carotid stenosis     a.  dopplers 8/54: LICA 62-70%;  b. Carotid dopplers 3/14:  R 0-39%, L 60-79%, repeat 6 mos  . PAD (peripheral artery disease)     ABIs 4/12:  R 0.88, L 0.92; R SFA 40%, L CFA 50%, L SFA 50-60%  . COPD (chronic obstructive pulmonary disease)    . Inguinal hernia   . Thyroid nodule     incidental finding on carotid doppler 3/14 => thyroid U/S ordered  . Myocardial infarction   . Anginal pain     occ last 1-2 months ago  . Shortness of breath   . Pneumonia     hx  . GERD (gastroesophageal reflux disease)     Family History: EtOH abuse  Social History: Tob: Denies current smoking  Exam: Current vital signs: BP 132/66  Pulse 66  Temp(Src) 97.5 F (36.4 C) (Oral)  Resp 16  Ht 5\' 7"  (1.702 m)  Wt 74.889 kg (165 lb 1.6 oz)  BMI 25.85 kg/m2  SpO2 96% Vital signs in last 24 hours: Temp:  [97.5 F (36.4 C)-98.1 F (36.7 C)] 97.5 F (36.4 C) (04/30 0000) Pulse Rate:  [52-66] 66 (04/30 0000) Resp:  [12-20] 16 (04/30 0000) BP: (111-143)/(66-107) 132/66 mmHg (04/30 0000) SpO2:  [92 %-98 %] 96 % (04/30 0000) Weight:  [70.988 kg (156 lb 8 oz)-74.889 kg (165 lb 1.6 oz)] 74.889 kg (165 lb 1.6 oz) (04/30 0000)  General: In bed, NAD CV: Regular rate and rhythm Mental Status: Patient is awake, alert, oriented to person, place, month, year, and situation. Immediate and remote memory are intact. Patient is able to give a clear and coherent history. No signs of  aphasia or neglect Cranial Nerves: II: Visual Fields are full. Pupils are equal, round, and reactive to light.  Discs are difficult to visualize. III,IV, VI: EOMI without ptosis or diploplia.  V: Facial sensation is symmetric to temperature VII: Facial movement is symmetric.  VIII: hearing is intact to voice X: Uvula elevates symmetrically XI: Shoulder shrug is symmetric. XII: tongue is midline without atrophy or fasciculations.  Motor: Tone is normal. Bulk is normal. 5/5 strength was present in right upper extremities, he has 4/5 weakness of his right lower extremity. He is 4+/5 weakness of his left arm and leg. Sensory: Sensation is symmetric to light touch. He does not have any clear spinal level. Deep Tendon Reflexes: Sustained clonus in bilateral ankles,  3+ reflexes at the brachioradialis and patella. He has positive Hoffmann's on the right Cerebellar: FNF with mild difficulty bilaterally Gait: Not tested due to patient safety concerns         I have reviewed labs in epic and the results pertinent to this consultation are: CMP-unremarkable  I have reviewed the images obtained: MRI brain-no acute findings  Impression: 63 year old male with bilateral leg weakness and hyperreflexia. I am concerned for possible cervical spinal disease contributing to his difficulty with walking. With bilateral symptoms, persistent right-sided weakness with negative MRI brain, I suspect spinal disease to be more likely than vascular etiology.  Recommendations: 1) MRI T., C-spine 2) b12  Roland Rack, MD [facility] [phone]  If 7pm- 7am, please page neurology on call as listed in [place].

## 2013-08-24 NOTE — H&P (Signed)
FMTS Attending Note  I personally saw and evaluated the patient. The plan of care was discussed with the resident team. I agree with the assessment and plan as documented by the resident.   63 y/o male with PMH CAD s/p CABG, HTN, PAD, AAA repair, left carotid stenosis (60-79%), previous CVA presented with symptoms consistent with possible stroke. He was working with his PT yesterday, while walking outside developed 30 seconds of dizziness and associated right leg weakness, the symptoms resolved when he sat down, he had return of his symptoms a few minutes later that again lasted 30 seconds, no associated chest pain, no palpitations, no shortness of breath, no nausea or emesis, patient reports one month history of worsening right LE weakness/unstadines with ambulation, he also reports short episodes of dizziness/lightheadedness when standing/ambulation  Patient reports that he was seen in the ED approx. 3 weeks ago and had urinary retention, a foley catheter was placed, he followed up with urology and catheter was removed a week later, per the patient the Urology team believes that his symptoms were due to BPH, the patient reports some increased leg weakness after returning home from the initial ED visit, he denies numbness however states that his legs will often shake which causes the weakness, patient denies recent fall, no syncope  Vitals: reviewed Gen: pleasant male, NAD, laying in hospital bed HEENT: normocephalic, PERRL, EOMI, no sceral icterus, nasal septum midline, MMM, uvula midline, neck supple, no anterior or posterior cervical lymphadenopathy Cardiac: RRR, S1 and S2 present, no murmurs, no heaves/thrills Resp: normal effort, coarse breath sounds bilaterally Abd: soft, midline scar present, normal bowel sounds, no tenderness, no rebound, no guarding Skin: warm, dry, intact, no rash Neuro: Alert and Oriented X3, CN2-12 intact, strength 5/5 in bilateral upper extremities, right hip flexion  3/5, left hip flexion 4/5, bilateral dorsi/planter flexion 4+/5, 3+ bilateral brachioradialis/biceps/patellar reflexes, sustained clonus in both ankles, no axterisis, rhomberg/gait not tested due to patient safety.   Reviewed lab work and imaging completed thus far. No events on telemetry.  Assessment and Plan: 63 y/o male admitted with intermittent dizziness and right LE weakness.  1. Dizziness/right LE weakness - symptoms concerning for TIA/CVA however MRI brain negative for acute issue, agree with cervical/thoracic/lumbar spine imaging to rule out spinal cord lesion. Differential includes ALS/demyelinating disease/metabolic illness/deconditioning. Check B12/TSH/RPR/HIV. Will also check orthostatic BP's and 2D echocardiogram. 2. CAD s/p CABG - no current chest pain, no events on telemetry, no acute ischemic changes on EKG, POC troponin negative, continue home regimen as outlined in resident note 3. Left Carotid Stenosis - 60-79% occlusion based on most recent carotid doppler in March 2015, patient does not follow with vascular surgery, as no acute CVA no acute intervention needed at this time, would recommend outpatient vascular surgery consultation 4. HTN - controlled on home medications 5. Bradycardia - may be contributing to dizziness, patient not currently symptomatic, continue to monitor on telemetry, consider cardiology consultation if patient continues to be bradycardic 6. UTI - UA suggestive of UTI, started on Rocephin, culture pending  Dossie Arbour MD

## 2013-08-24 NOTE — Progress Notes (Signed)
Echocardiogram 2D Echocardiogram has been performed.  Alyson Locket Malcome Ambrocio 08/24/2013, 11:54 AM

## 2013-08-25 DIAGNOSIS — G459 Transient cerebral ischemic attack, unspecified: Principal | ICD-10-CM

## 2013-08-25 MED ORDER — CARVEDILOL 3.125 MG PO TABS
3.1250 mg | ORAL_TABLET | Freq: Every day | ORAL | Status: DC
  Administered 2013-08-25 – 2013-08-26 (×2): 3.125 mg via ORAL
  Filled 2013-08-25 (×3): qty 1

## 2013-08-25 MED ORDER — BENZONATATE 100 MG PO CAPS
100.0000 mg | ORAL_CAPSULE | Freq: Three times a day (TID) | ORAL | Status: DC | PRN
  Filled 2013-08-25: qty 1

## 2013-08-25 NOTE — Discharge Summary (Signed)
[facility] Discharge Summary  Patient name: Chris Payne Medical record number: [id number] Date of birth: [birth year] Age: 63 y.o. Gender: male Date of Admission: 08/23/2013  Date of Discharge: 08/26/2013 Admitting Physician: Lupita Dawn, MD  Primary Care Provider: Ellsworth Lennox, MD Consultants: neuro  Indication for Hospitalization: dizziness and assc right leg weakness  Discharge Diagnoses/Problem List:  -hx of ischemic stroke -CAD -HTN -PAD -AAA -Cartoid stenosis -transient leg weakness -DJD -UTI -chronic back pain  Disposition: home  Discharge Condition: stable  Discharge Exam:  BP 122/73  Pulse 59  Temp(Src) 98 F (36.7 C) (Oral)  Resp 20  Ht 5\' 7"  (1.702 m)  Wt 165 lb 1.6 oz (74.889 kg)  BMI 25.85 kg/m2  SpO2 98% General: Lying in bed, in no acute distress,  HEENT: NCAT, face symmetric, EOMI  Cardiovascular: Regular rate rhythm, no murmurs  Respiratory: Coarse breath sounds bilaterally via anterior auscultation, good air movement, chronic coarse cough  Abdomen: Soft, non-tender, non-distended, +BS  Extremities: WWP  Neuro: Alert, oriented x3, no obvious cranial nerve deficit, weakness 3/5 with hip flexion bilaterally, grip 5/5 bilaterally sustained clonus at bilateral ankles L worse than R  Brief Hospital Course:  Chris Payne is a 63 y.o. male presenting with transient right leg weakness . PMH is significant for CAD, HTN, PAD, AAA, carotid stenosis, CVA   # Neuro:Transient right sided muscle weakness: Pt presenting with several 30 sec episodes of dizziness and assc right leg weakness while working with PT on day of admission. Has also had 1 month hx of worsening right LE weakness/unsteadiness with ambulation. Additionally was seen in the ED approx 3 weeks prior for urinary retention (for which foley cath placed). Urology felt at that time sx assc with BPH. Pt initially went to UC who sent to the ED for further eval. In the  ED neuro consulted by EDP and rec admission for TIA w/up. On admission pt with bilat clonus and hyperreflexia. MRI/A neg for acute intracranial process. Given concerns for UMN lesion MRI of cervical/thoracic and lumbar spine obtained which was neg for spinal lesion or stenosis but did show sig multilevel DJD. B12/TSH/RPR/HIV all wnl. Last echo showed severe hypokinesis of apex and EF of 50-55% (10/05/2013). F/up largely unchanged (see below). Neuro consulted regarding imaging and felt that pt ok to d/c. Pt to f/up closely with neuro as imaging suggestive of peripheral rather than central nerve issues, which would not explain hyperreflexia/clonus. HH pt established.   # CV: CAD with history of ischemic stroke and MI, AAA, Hypertension Currently on metoprolol as a home medication, given above. HR 50s-60 throughout admission, pt switched to coreg 3.125mg  for better toleration.Cont'd on amlodipine. Continued on home ASA, consider switching ator to crestor on d/c given risk factors. Continue Imdur and Ranexa. Repeat ECHO EF 55-60 without thrombus. LICA currently within range for intervention with CEA. Vascular surgery consulted for outpt f/up. Will call pt for appointment.   # Uro: UTI: Patient's urinalysis suggestive of infection in addition to sx of dysuria symptoms. No frequency, fevers or CVA tenderness. Was placed on CTX 1g q24hrs while inpt. Ucx ecoli sensitive to keflex. Transitioned to PO to complete 4 additional days of abx.   # MSK: Chronic back pain Likely 2/2 DJD (see above). Continued on home MS contin 30mg  BID. Given additional morphine2mg q2prn. PT/OT consulted, recommended home health. Case management consulted to facilitate on discharge. Consider outpt ortho referral.  Issues for Follow Up:  -ensure receiving HH PT services,  set up prior to d/c -needs outpt f/u with neurology for clonus/hyperreflexia and weakness (pt given info to call and schedule) -also needs outpt f/u with vascular surgery  for carotid stenosis (they will call pt to schedule) -consider ortho referral as outpt given degenerative findings on MRI spine -instructed pt to f/u with urologist due to his UTI  Significant Procedures:   Significant Labs and Imaging:   Recent Labs Lab 08/23/13 1736 08/23/13 1918  WBC 6.3 6.9  HGB 12.9* 13.8  HCT 40.9* 40.8  PLT  --  242    Recent Labs Lab 08/23/13 1736 08/23/13 1918  NA 136 135*  K 5.0 4.6  CL 98 96  CO2 28 26  GLUCOSE 95 97  BUN 16 18  CREATININE 0.86 0.89  CALCIUM 9.6 9.3  ALKPHOS 90 91  AST 15 16  ALT 15 15  ALBUMIN 3.9 3.1*    IMPRESSION: MRI HEAD IMPRESSION: 1. No acute intracranial infarct or other abnormality identified within the brain. 2. Age-related atrophy with moderate chronic microvascular ischemic disease involving the supratentorial white matter, advanced relative to prior MRI from 10/10/2009. 3. Remote pontine, left frontal lobe, right basal ganglia, and left cerebellar infarcts.  MRA HEAD IMPRESSION: 1. Small but patent right vertebral artery to the level of the right PICA. 2. 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 There is flow within the posterior cerebral arteries bilaterally, likely via collateral circulation from the anterior circulation. Overall, these findings are not significantly changed relative to previous MRA from 10/10/2009   CXR 08/23/13 IMPRESSION: Emphysema. No acute cardiopulmonary abnormality   08/24/13  IMPRESSION:  MRI CERVICAL SPINE IMPRESSION:  1. Mild multilevel degenerative disc disease as detailed above, most  severe at C3-4 there is mild canal stenosis.  2. Degenerative disc osteophyte at C3-4 and C4-5 with resultant  moderate left foraminal stenosis.  MRI THORACIC SPINE IMPRESSION:  Normal MRI of the thoracic spine.  MRI LUMBAR SPINE IMPRESSION:  1. Degenerative disc bulge at L5-S1 with resultant severe right and  moderate left foraminal stenosis.  2.  Additional mild multilevel degenerative changes as above. No  significant canal stenosis identified within the lumbar spine   Results/Tests Pending at Time of Discharge:  Urine culture - E coli, sensitivities pending at time of d/c (discharged on keflex)  Discharge Medications:    Medication List    STOP taking these medications       atorvastatin 80 MG tablet  Commonly known as:  LIPITOR     metoprolol tartrate 25 MG tablet  Commonly known as:  LOPRESSOR      TAKE these medications       amLODipine 2.5 MG tablet  Commonly known as:  NORVASC  Take 1 tablet (2.5 mg total) by mouth daily.     aspirin EC 81 MG tablet  Take 81 mg by mouth daily.     carvedilol 3.125 MG tablet  Commonly known as:  COREG  Take 1 tablet (3.125 mg total) by mouth daily with breakfast.     cephALEXin 500 MG capsule  Commonly known as:  KEFLEX  Take 1 capsule (500 mg total) by mouth 3 (three) times daily. For 4 more days     gabapentin 300 MG capsule  Commonly known as:  NEURONTIN  Take 1 capsule (300mg ) in the morning and 2 capsules (600mg ) at bedtime     guaiFENesin 600 MG 12 hr tablet  Commonly known as:  MUCINEX  Take  600 mg by mouth 2 (two) times daily.     HYDROcodone-acetaminophen 10-325 MG per tablet  Commonly known as:  NORCO  Take 2 tablets by mouth every 12 (twelve) hours as needed for moderate pain.     isosorbide mononitrate 120 MG 24 hr tablet  Commonly known as:  IMDUR  Take 1 tablet (120 mg total) by mouth daily.     morphine 30 MG 12 hr tablet  Commonly known as:  MS CONTIN  Take 30 mg by mouth every 12 (twelve) hours.     nitroGLYCERIN 0.4 MG SL tablet  Commonly known as:  NITROSTAT  Place 0.4 mg under the tongue every 5 (five) minutes as needed for chest pain.     ranolazine 1000 MG SR tablet  Commonly known as:  RANEXA  Take 1 tablet (1,000 mg total) by mouth 2 (two) times daily.     rosuvastatin 40 MG tablet  Commonly known as:  CRESTOR  Take 1 tablet (40  mg total) by mouth daily.        Discharge Instructions: Please refer to Patient Instructions section of EMR for full details.  Patient was counseled important signs and symptoms that should prompt return to medical care, changes in medications, dietary instructions, activity restrictions, and follow up appointments.   Follow-Up Appointments:     Follow-up Information   Schedule an appointment as soon as possible for a visit with Ellsworth Lennox, MD. (for post hospital follow up)    Specialty:  Family Medicine   Contact information:   [place] [address] [phone]       Follow up with [facility]. (will call you for an appointment)    Contact information:   [place] [address] [phone]      Langston Masker, MD 08/25/2013, 7:39 PM PGY-1, [place]

## 2013-08-25 NOTE — Progress Notes (Signed)
UR complete.  Hakeen Shipes RN, MSN 

## 2013-08-25 NOTE — Progress Notes (Signed)
FMTS ATTENDING  NOTE Chris Eniola,MD I  have seen and examined this patient, reviewed their chart. I have discussed this patient with the resident. I agree with the resident's findings, assessment and care plan. MRI spine reviewed,does not correlate with his symptoms and signs. He still have b/l ankle clonus with mild hyperreflexia. Question if this is due to parkinson disease. Resident to contact neuro if trial of Sinemet is appropriate. Other infectious or metabolic causes ruled out, HIV neg,RPR neg, Vit B12 and TSH normal. He will benefit from neuro follow up either inpatient or outpatient. Continue Rocephin for UTI while awaiting culture report.

## 2013-08-25 NOTE — Progress Notes (Signed)
Family Medicine Teaching Service Daily Progress Note Intern Pager: [phone]  Patient name: Chris Payne Medical record number: [id number] Date of birth: 16-Dec-1950 Age: 63 y.o. Gender: male  Primary Care Provider: Ellsworth Lennox, MD Consultants: neuro Code Status: full  Pt Overview and Major Events to Date:   Assessment and Plan: Chris Payne is a 63 y.o. male presenting with transient right leg weakness . PMH is significant for CAD, HTN, PAD, AAA, carotid stenosis, CVA   # Neuro:Transient right sided muscle weakness: patient has significant vascular disease. Has previous carotid dopplers showing 70-62% occulusion of LICA in March 3762. Last echo showed severe hypokinesis of apex and EF of 50-55% (10/05/2013). MRI brain shows no acute pathology.However gives a report of recent falls, worsening lower extremity tremors in last 2 months 3 weeks ago some bladder hesitancy. MR spine with multilevel degenerative changes. Physical exam notable for multiple beats of clonus bilaterally and hyperreflexia. Concerning for UMN lesion (transverse myelitis, MS, ALS, demyelinating d/o) vs vitamin deficiency vs infectious etiology (HIV/syphilis) ;B12, TSH, RPR, HIV all wnl -Cardiac monitoring  -Follow-up neurology recommendations -orthostatic vitals  # CV: CAD with history of ischemic stroke and MI, AAA/ Hypertension Currently on metoprolol as a home medication, given above. HR 50s-60s. Repeat ECHO EF 55-60 without thrombus -Continue home aspirin, switch ator to crestor as outpt given risk factors -Continue Imdur and Ranexa -LICA currently within range for intervention with CEA. vascular surgery outpatient follow-up -Continue home amlodipine and metoprolol -monitor for events on tele, consider cards consult if conts to be bradycardic, could also consider switching to low dose coreg vs metoprolol  # Uro: UTI: patient's urinalysis suggestive of infection. Has dysuria symptoms as well. No frequency,  fevers, no CVA tenderness  -Ceftriaxone 1g q24 while inpatient -urine culture pending  # MSK: Chronic back pain  -Continue MS contin 30mg  BID  -Morphine 2mg  q2 PRN -Pt/ot consults  FEN/GI: Heart healthy, saline lock  Prophylaxis: Heparin subq  Disposition: further neurology recommendations and work up of clonus; PT/OT  Subjective:  No complaints this morning; had difficulty getting sleep 2/2 discomfort with beds but otherwise no further acute weakness  Objective: Temp:  [97.4 F (36.3 C)-98.8 F (37.1 C)] 97.8 F (36.6 C) (05/01 0617) Pulse Rate:  [53-75] 56 (05/01 0617) Resp:  [18-20] 18 (05/01 0617) BP: (103-132)/(57-83) 118/74 mmHg (05/01 0617) SpO2:  [94 %-98 %] 98 % (05/01 0617) Physical Exam: General: Sitting up in bed, in no acute distress,   HEENT: PERRL, EMOI, moist mucous membranes  Cardiovascular: Regular rate rhythm, 2+ radial pulses and 1+ pedal pulses (left foot with 2+ pitting edema) Respiratory: Coarse breath sounds bilatearlly, poor air movement; chronic cough coarse junky Abdomen: Soft, non-tender, non-distended  Extremities: Cool extremities, slight erythema at tips of toes  Skin: multiple seborrheic lesions throughout  Neuro: Alert, oriented x3, no cranial nerve deficit, strength 5/5 in bilateral upper extremities, right hip flexion 3/5, left hip flexion 4/5, bilateral dorsi/planter flexion 4+/5; patient has tremor with intentional movements of legs bilaterally, sustained clonus at bilateral ankles, reflexes 3+ BL most notable at the brachioradialis  Laboratory:  Recent Labs Lab 08/23/13 1736 08/23/13 1918  WBC 6.3 6.9  HGB 12.9* 13.8  HCT 40.9* 40.8  PLT  --  242    Recent Labs Lab 08/23/13 1736 08/23/13 1918  NA 136 135*  K 5.0 4.6  CL 98 96  CO2 28 26  BUN 16 18  CREATININE 0.86 0.89  CALCIUM 9.6 9.3  PROT 7.0 7.1  BILITOT 0.8 0.6  ALKPHOS 90 91  ALT 15 15  AST 15 16  GLUCOSE 95 97    Imaging/Diagnostic Tests: IMPRESSION: MRI  HEAD IMPRESSION: 1. No acute intracranial infarct or other abnormality identified within the brain. 2. Age-related atrophy with moderate chronic microvascular ischemic disease involving the supratentorial white matter, advanced relative to prior MRI from 10/10/2009. 3. Remote pontine, left frontal lobe, right basal ganglia, and left cerebellar infarcts.   MRA HEAD IMPRESSION: 1. Small but patent right vertebral artery to the level of the right PICA. 2. 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 There is flow within the posterior cerebral arteries bilaterally, likely via collateral circulation from the anterior circulation. Overall, these findings are not significantly changed relative to previous MRA from 10/10/2009  CXR 08/23/13 IMPRESSION: Emphysema. No acute cardiopulmonary abnormality  08/24/13 IMPRESSION:  MRI CERVICAL SPINE IMPRESSION:  1. Mild multilevel degenerative disc disease as detailed above, most  severe at C3-4 there is mild canal stenosis.  2. Degenerative disc osteophyte at C3-4 and C4-5 with resultant  moderate left foraminal stenosis.  MRI THORACIC SPINE IMPRESSION:  Normal MRI of the thoracic spine.  MRI LUMBAR SPINE IMPRESSION:  1. Degenerative disc bulge at L5-S1 with resultant severe right and  moderate left foraminal stenosis.  2. Additional mild multilevel degenerative changes as above. No  significant canal stenosis identified within the lumbar spine   Langston Masker, MD 08/25/2013, 7:32 AM PGY-1, [place] Intern pager: [phone], text pages welcome

## 2013-08-25 NOTE — Evaluation (Signed)
Occupational Therapy Evaluation Patient Details Name: Chris Payne MRN: [id number] DOB: 06-10-1950 Today's Date: 08/25/2013    History of Present Illness Chris Payne is a 63 y.o. male presenting with transient right leg weakness . PMH is significant for CAD, HTN, PAD, AAA, carotid stenosis, CVA    Clinical Impression   Pt admitted with above. Pt requires assist with LB ADLs at baseline (wife assists) and is able to ambulate around house with walker at [place] I level in order to retrieve items.   Pt will benefit from acute OT serivces to address below problem list.     Follow Up Recommendations  No OT follow up;Supervision/Assistance - 24 hour    Equipment Recommendations  None recommended by OT    Recommendations for Other Services       Precautions / Restrictions Precautions Precautions: Fall Precaution Comments: 1 fall 3 weeks ago per pt  Restrictions Weight Bearing Restrictions: No      Mobility Bed Mobility Overal bed mobility: Modified Independent             General bed mobility comments: incr time for bed mobility   Transfers Overall transfer level: Needs assistance Equipment used: Rolling walker (2 wheeled) Transfers: Sit to/from Stand Sit to Stand: Min assist         General transfer comment: Assist for power up. VCs for hand placement.    Balance Overall balance assessment: Needs assistance;History of Falls Sitting-balance support: Feet supported;No upper extremity supported Sitting balance-Leahy Scale: Fair Sitting balance - Comments: LE tremors                                     ADL Overall ADL's : Needs assistance/impaired     Grooming: Wash/dry hands;Wash/dry face;Applying deodorant;Minimal assistance;Standing       Lower Body Bathing: Moderate assistance;Sit to/from stand       Lower Body Dressing: Moderate assistance;Sit to/from stand   Toilet Transfer: Minimal assistance;Ambulation;RW           Functional mobility during ADLs: Minimal assistance;Rolling walker General ADL Comments: Pt fatigued quickly while standing with assist at sink.  He states he feels a little weaker than compared to his baseline.  Recommended pt use w/c in home for mobility as needed if he is feeling too weak to ambulate with RW.     Vision                     Perception     Praxis      Pertinent Vitals/Pain See vitals tab     Hand Dominance Right   Extremity/Trunk Assessment Upper Extremity Assessment Upper Extremity Assessment: Generalized weakness   Lower Extremity Assessment Lower Extremity Assessment: Generalized weakness;RLE deficits/detail;LLE deficits/detail RLE Deficits / Details: increased tremors with MMT; able to WB through LEs in standing  RLE Sensation:  (c/o numbness intermitent ) RLE Coordination: decreased gross motor LLE Deficits / Details: increased tremors with MMT; was able to WB through LEs LLE Sensation:  (c/o numbness intermittently ) LLE Coordination: decreased gross motor   Cervical / Trunk Assessment Cervical / Trunk Assessment: Normal   Communication Communication Communication: No difficulties   Cognition Arousal/Alertness: Awake/alert Behavior During Therapy: WFL for tasks assessed/performed Overall Cognitive Status: No family/caregiver present to determine baseline cognitive functioning                     General  Comments       Exercises       Shoulder Instructions      Home Living Family/patient expects to be discharged to:: Private residence Living Arrangements: Spouse/significant other;Children Available Help at Discharge: Family;Available 24 hours/day Type of Home: Mobile home Home Access: Ramped entrance     Home Layout: One level     Bathroom Shower/Tub: Occupational psychologist: Handicapped height     Home Equipment: [place] - single point;Wheelchair - [employer] - 4 wheels;Hospital bed;Shower  seat;Adaptive equipment Adaptive Equipment: [place] Additional Comments: reports he has an elevated toilet seat       Prior Functioning/Environment Level of Independence: Needs assistance  Gait / Transfers Assistance Needed: pt reports he was independent with ambulation with his RW in house ; reports he has wheelchair for increased ambulation distance  ADL's / Homemaking Assistance Needed: requires (A) with LE dressing and bathing         OT Diagnosis: Generalized weakness   OT Problem List: Decreased strength;Impaired balance (sitting and/or standing);Decreased activity tolerance;Decreased knowledge of use of DME or AE   OT Treatment/Interventions: Self-care/ADL training;DME and/or AE instruction;Therapeutic activities;Patient/family education;Balance training    OT Goals(Current goals can be found in the care plan section) Acute Rehab OT Goals Patient Stated Goal: to return home OT Goal Formulation: With patient Time For Goal Achievement: 09/08/13 Potential to Achieve Goals: Good  OT Frequency: Min 2X/week   Barriers to D/C:            Co-evaluation              End of Session Equipment Utilized During Treatment: Gait belt;Rolling walker Nurse Communication: Mobility status  Activity Tolerance: Patient limited by fatigue Patient left: in chair;with call bell/phone within reach   Time: 1126-1140 OT Time Calculation (min): 14 min Charges:  OT General Charges $OT Visit: 1 Procedure OT Evaluation $Initial OT Evaluation Tier I: 1 Procedure OT Treatments $Self Care/Home Management : 8-22 mins G-Codes:    Luther Bradley 09/13/13, 12:10 PM 2013-09-13 Luther Bradley OTR/L Pager [phone] Office [phone]

## 2013-08-25 NOTE — Progress Notes (Signed)
NEURO HOSPITALIST PROGRESS NOTE   SUBJECTIVE:                                                                                                                        Patient has no complaints. No further abnormal movements of his LE.   OBJECTIVE:                                                                                                                           Vital signs in last 24 hours: Temp:  [97.4 F (36.3 C)-98.8 F (37.1 C)] 97.8 F (36.6 C) (05/01 0617) Pulse Rate:  [53-75] 56 (05/01 0617) Resp:  [18-20] 18 (05/01 0617) BP: (103-132)/(57-83) 118/74 mmHg (05/01 0617) SpO2:  [94 %-98 %] 98 % (05/01 0617)  Intake/Output from previous day: 04/30 0701 - 05/01 0700 In: 963 [P.O.:960; I.V.:3] Out: 1125 [Urine:1125] Intake/Output this shift: Total I/O In: -  Out: 50 [Urine:50] Nutritional status: Cardiac  Past Medical History  Diagnosis Date  . Hypertension   . Stroke     h/o pontine CVA  . AAA (abdominal aortic aneurysm)     s/p repair 6/11  . Hyperlipidemia   . CAD (coronary artery disease)     s/p CABG 2/12:  LIMA to LAD, SVG to diagonal-1, SVG to ramus intermedius,, SVG to AM (Dr. Roxan Hockey) ;  b.  Myoview 10/13:  inf infarct with very mild peri-infarct ischemia, EF 49%, inf HK; c. Cath 2/25 showed 2/4 occluded grafts & high grade stenosis in the LAD distal to the LIMA insertion, not amenable to PCI - Med Rx  . Carotid stenosis     a.  dopplers 0000000: LICA 0000000;  b. Carotid dopplers 3/14:  R 0-39%, L 60-79%, repeat 6 mos  . PAD (peripheral artery disease)     ABIs 4/12:  R 0.88, L 0.92; R SFA 40%, L CFA 50%, L SFA 50-60%  . COPD (chronic obstructive pulmonary disease)   . Inguinal hernia   . Thyroid nodule     incidental finding on carotid doppler 3/14 => thyroid U/S ordered  . Myocardial infarction   . Anginal pain     occ last 1-2 months ago  . Shortness of breath   .  Pneumonia     hx  . GERD (gastroesophageal  reflux disease)      Neurologic Exam:   Mental Status: Alert, oriented, thought content appropriate.  Speech fluent without evidence of aphasia.  Able to follow 3 step commands without difficulty. Cranial Nerves: II: Discs flat bilaterally; Visual fields grossly normal, pupils equal, round, reactive to light and accommodation III,IV, VI: ptosis not present, extra-ocular motions intact bilaterally V,VII: smile symmetric, facial light touch sensation normal bilaterally VIII: hearing normal bilaterally IX,X: gag reflex present XI: bilateral shoulder shrug XII: midline tongue extension without atrophy or fasciculations  Motor: Right : Upper extremity   5/5    Left:     Upper extremity   5/5  Lower extremity   4/5     Lower extremity   4/5  PF/DF 4/5 mild clonus with movement  PF/DF 4/5 sustained clonus with movement Has decreased bulk in the left leg compared to the right leg.  Bilateral legs have mild increased tone. . No fasciculation.  Sensory: Pinprick and light touch intact throughout, bilaterally Deep Tendon Reflexes:  Right: Upper Extremity   Left: Upper extremity   biceps (C-5 to C-6) 2/4   biceps (C-5 to C-6) 2/4 tricep (C7) 2/4    triceps (C7) 2/4 Brachioradialis (C6) 2/4  Brachioradialis (C6) 2/4  Lower Extremity Lower Extremity  quadriceps (L-2 to L-4) 3/4   quadriceps (L-2 to L-4) 3/4 Achilles (S1) 4/4   Achilles (S1) 4/4 (+) hoffmans on the right.   Plantars: Right: downgoing   Left: downgoing Cerebellar: normal finger-to-nose with slight tremor,  normal heel-to-shin test   Lab Results: Basic Metabolic Panel:  Recent Labs Lab 08/23/13 1736 08/23/13 1918  NA 136 135*  K 5.0 4.6  CL 98 96  CO2 28 26  GLUCOSE 95 97  BUN 16 18  CREATININE 0.86 0.89  CALCIUM 9.6 9.3    Liver Function Tests:  Recent Labs Lab 08/23/13 1736 08/23/13 1918  AST 15 16  ALT 15 15  ALKPHOS 90 91  BILITOT 0.8 0.6  PROT 7.0 7.1  ALBUMIN 3.9 3.1*   No results found for  this basename: LIPASE, AMYLASE,  in the last 168 hours No results found for this basename: AMMONIA,  in the last 168 hours  CBC:  Recent Labs Lab 08/23/13 1736 08/23/13 1918  WBC 6.3 6.9  NEUTROABS  --  4.3  HGB 12.9* 13.8  HCT 40.9* 40.8  MCV 102.2* 99.5  PLT  --  242    Cardiac Enzymes:  Recent Labs Lab 08/23/13 2048  TROPONINI <0.30    Lipid Panel: No results found for this basename: CHOL, TRIG, HDL, CHOLHDL, VLDL, LDLCALC,  in the last 168 hours  CBG: No results found for this basename: GLUCAP,  in the last 168 hours  Microbiology: Results for orders placed during the hospital encounter of 07/10/13  CULTURE, BLOOD (ROUTINE X 2)     Status: None   Collection Time    07/10/13  6:21 PM      Result Value Ref Range Status   Specimen Description BLOOD RIGHT ARM   Final   Special Requests     Final   Value: BOTTLES DRAWN AEROBIC AND ANAEROBIC 10CC AER,5CC ANA   Culture  Setup Time     Final   Value: 07/11/2013 01:05     Performed at [facility]   Culture     Final   Value: NO GROWTH 5 DAYS     Performed  at [facility]   Report Status 07/17/2013 FINAL   Final  CULTURE, BLOOD (ROUTINE X 2)     Status: None   Collection Time    07/10/13  6:27 PM      Result Value Ref Range Status   Specimen Description BLOOD LEFT HAND   Final   Special Requests BOTTLES DRAWN AEROBIC ONLY [facility]   Final   Culture  Setup Time     Final   Value: 07/11/2013 01:05     Performed at [facility]   Culture     Final   Value: NO GROWTH 5 DAYS     Performed at [facility]   Report Status 07/17/2013 FINAL   Final    Coagulation Studies:  Recent Labs  08/23/13 2050  LABPROT 14.0  INR 1.10    Imaging: Dg Chest 2 View  08/23/2013   CLINICAL DATA:  Chest pain  EXAM: CHEST  2 VIEW  COMPARISON:  Prior radiograph from 07/21/2013  FINDINGS: Median sternotomy wires with underlying CABG markers again noted. Mild cardiomegaly is stable.  Emphysematous  changes noted. No focal infiltrate, pulmonary edema, or pleural effusion. There is no pneumothorax.  No acute osseus abnormality. Multilevel degenerative changes noted within the visualized spine.  IMPRESSION: Emphysema.  No acute cardiopulmonary abnormality.   Electronically Signed   By: Jeannine Boga M.D.   On: 08/23/2013 23:07   Mr Angiogram Head Wo Contrast  08/23/2013   CLINICAL DATA:  Sudden onset of right arm and leg weakness. Now resolved.  EXAM: MRI HEAD WITHOUT CONTRAST  MRA HEAD WITHOUT CONTRAST  TECHNIQUE: Multiplanar, multiecho pulse sequences of the brain and surrounding structures were obtained without intravenous contrast. Angiographic images of the head were obtained using MRA technique without contrast.  COMPARISON:  Prior CT performed earlier on the same day as well as prior MRI from 10/10/2009.  FINDINGS: MRI HEAD FINDINGS  Diffuse prominence of the CSF containing spaces is compatible with generalized cerebral atrophy. Scattered and confluent T2/FLAIR hyperintensity within the periventricular and deep white matter both cerebral hemispheres is present, most likely related to moderate chronic microvascular ischemic disease. Cystic encephalomalacia within the bilateral pons are most compatible with remote pontine infarcts. Additional wedge-shaped area of encephalomalacia with associated gliosis within the anterior left frontal lobe is also consistent with remote infarct. Tiny remote left cerebellar and right basal ganglia infarcts also noted.  No mass lesion, midline shift, or extra-axial fluid collection. Ventricles are normal in size without evidence of hydrocephalus.  No diffusion-weighted signal abnormality is identified to suggest acute intracranial infarct. Gray-white matter differentiation is maintained. Normal flow voids are seen within the intracranial vasculature. No intracranial hemorrhage identified.  The cervicomedullary junction is normal. Pituitary gland is within normal  limits. Pituitary stalk is midline. The globes and optic nerves demonstrate a normal appearance with normal signal intensity. The  The bone marrow signal intensity is normal. Calvarium is intact. Visualized upper cervical spine is within normal limits.  Scalp soft tissues are unremarkable.  Paranasal sinuses are clear.  No mastoid effusion.  MRA HEAD FINDINGS  The visualized portions of the distal cervical internal carotid arteries are widely patent with antegrade flow. The petrous, cavernous, and supra clinoid segments are symmetric in caliber bilaterally. A1 segments, anterior communicating artery, and anterior cerebral arteries are widely patent. Middle cerebral arteries are widely patent with antegrade flow without high-grade flow-limiting stenosis or proximal branch occlusion. Distal MCA branches are symmetric bilaterally. No intracranial aneurysm within the  anterior circulation.  The distal right vertebral artery is small bowel with patent antegrade flow. The right vertebral artery is seen to the level of the right posterior inferior cerebellar artery. No flow within the right vertebral artery is seen distal to this point. Flow within the distal left vertebral artery is absent, and is likely occluded. No flow with thrombosis seen within the basilar artery. Thready flow was seen within the posterior cerebral arteries bilaterally, likely related to collateral circulation via the posterior communicating arteries from the anterior circulation. The left posterior communicating artery appears larger than the right. Overall, these findings are not significantly changed relative to previous MRA from 10/10/2009.  IMPRESSION: MRI HEAD IMPRESSION:  1. No acute intracranial infarct or other abnormality identified within the brain. 2. Age-related atrophy with moderate chronic microvascular ischemic disease involving the supratentorial white matter, advanced relative to prior MRI from 10/10/2009. 3. Remote pontine, left  frontal lobe, right basal ganglia, and left cerebellar infarcts.  MRA HEAD IMPRESSION:  1. Small but patent right vertebral artery to the level of the right PICA. 2. 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 There is flow within the posterior cerebral arteries bilaterally, likely via collateral circulation from the anterior circulation. Overall, these findings are not significantly changed relative to previous MRA from 10/10/2009.   Electronically Signed   By: Jeannine Boga M.D.   On: 08/23/2013 22:41   Mr Brain Wo Contrast  08/23/2013   CLINICAL DATA:  Sudden onset of right arm and leg weakness. Now resolved.  EXAM: MRI HEAD WITHOUT CONTRAST  MRA HEAD WITHOUT CONTRAST  TECHNIQUE: Multiplanar, multiecho pulse sequences of the brain and surrounding structures were obtained without intravenous contrast. Angiographic images of the head were obtained using MRA technique without contrast.  COMPARISON:  Prior CT performed earlier on the same day as well as prior MRI from 10/10/2009.  FINDINGS: MRI HEAD FINDINGS  Diffuse prominence of the CSF containing spaces is compatible with generalized cerebral atrophy. Scattered and confluent T2/FLAIR hyperintensity within the periventricular and deep white matter both cerebral hemispheres is present, most likely related to moderate chronic microvascular ischemic disease. Cystic encephalomalacia within the bilateral pons are most compatible with remote pontine infarcts. Additional wedge-shaped area of encephalomalacia with associated gliosis within the anterior left frontal lobe is also consistent with remote infarct. Tiny remote left cerebellar and right basal ganglia infarcts also noted.  No mass lesion, midline shift, or extra-axial fluid collection. Ventricles are normal in size without evidence of hydrocephalus.  No diffusion-weighted signal abnormality is identified to suggest acute intracranial infarct. Gray-white matter  differentiation is maintained. Normal flow voids are seen within the intracranial vasculature. No intracranial hemorrhage identified.  The cervicomedullary junction is normal. Pituitary gland is within normal limits. Pituitary stalk is midline. The globes and optic nerves demonstrate a normal appearance with normal signal intensity. The  The bone marrow signal intensity is normal. Calvarium is intact. Visualized upper cervical spine is within normal limits.  Scalp soft tissues are unremarkable.  Paranasal sinuses are clear.  No mastoid effusion.  MRA HEAD FINDINGS  The visualized portions of the distal cervical internal carotid arteries are widely patent with antegrade flow. The petrous, cavernous, and supra clinoid segments are symmetric in caliber bilaterally. A1 segments, anterior communicating artery, and anterior cerebral arteries are widely patent. Middle cerebral arteries are widely patent with antegrade flow without high-grade flow-limiting stenosis or proximal branch occlusion. Distal MCA branches are symmetric bilaterally. No intracranial aneurysm  within the anterior circulation.  The distal right vertebral artery is small bowel with patent antegrade flow. The right vertebral artery is seen to the level of the right posterior inferior cerebellar artery. No flow within the right vertebral artery is seen distal to this point. Flow within the distal left vertebral artery is absent, and is likely occluded. No flow with thrombosis seen within the basilar artery. Thready flow was seen within the posterior cerebral arteries bilaterally, likely related to collateral circulation via the posterior communicating arteries from the anterior circulation. The left posterior communicating artery appears larger than the right. Overall, these findings are not significantly changed relative to previous MRA from 10/10/2009.  IMPRESSION: MRI HEAD IMPRESSION:  1. No acute intracranial infarct or other abnormality identified  within the brain. 2. Age-related atrophy with moderate chronic microvascular ischemic disease involving the supratentorial white matter, advanced relative to prior MRI from 10/10/2009. 3. Remote pontine, left frontal lobe, right basal ganglia, and left cerebellar infarcts.  MRA HEAD IMPRESSION:  1. Small but patent right vertebral artery to the level of the right PICA. 2. 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 There is flow within the posterior cerebral arteries bilaterally, likely via collateral circulation from the anterior circulation. Overall, these findings are not significantly changed relative to previous MRA from 10/10/2009.   Electronically Signed   By: Rise Mu M.D.   On: 08/23/2013 22:41   Mr Cervical Spine Wo Contrast  08/25/2013   CLINICAL DATA:  Transient right leg weakness, clonus.  EXAM: MRI CERVICAL, THORACIC, AND LUMBAR SPINE WITHOUT CONTRAST  TECHNIQUE: Multiplanar and multiecho pulse sequences of the cervical spine, to include the craniocervical junction and cervicothoracic junction, thoracic spine, and lumbar spine, were obtained without intravenous contrast.  COMPARISON:  Prior MRI of the brain performed on 08/23/2013.  FINDINGS: MRI CERVICAL SPINE FINDINGS:  The visualized portions of the brain and posterior fossa are normal. Craniocervical junction is widely patent.  Vertebral bodies are normally aligned with preservation of the normal cervical lordosis. Vertebral body heights are preserved. Signal intensity within the vertebral body bone marrow and spinal cord are normal.  At C2-3, there is a small central disc protrusion which minimally indents the ventral thecal sac. No canal or neural foraminal stenosis.  At C3-4, there is broad-based degenerative disc osteophyte with left-sided uncovertebral osteophytosis. There is resultant moderate left foraminal stenosis. Posterior disc osteophyte partially effaces the ventral thecal sac and  results in mild canal narrowing. No right foraminal stenosis.  At C4-5, there is mild diffuse broad-based degenerative disc osteophyte with left-sided uncovertebral osteophytosis. There is resultant moderate left foraminal stenosis. No central canal narrowing.  At C5-6, mild diffuse degenerative disc osteophyte with moderate bilateral facet arthrosis. There is resultant mild bilateral foraminal narrowing. No central canal stenosis.  At C6-7, mild diffuse broad-based degenerative disc osteophyte that partially effaces the posterior ventral thecal sac without significant canal stenosis. There is mild bilateral foraminal narrowing, right slightly worse than left.  At C7-T1, no canal or neural foraminal stenosis.  MRI THORACIC SPINE FINDINGS:  The vertebral bodies are normally aligned with preservation of the normal thoracic kyphosis. Vertebral body heights are well preserved. Signal intensity within the vertebral body bone marrow and spinal cord is normal.  No disc bulge or focal disc protrusion identified within the thoracic spine. No significant canal or neural foraminal stenosis identified.  The visualized paraspinous soft tissues are normal. Visualized lung bases are clear.  MRI LUMBAR SPINE  FINDINGS:  Mild levoscoliosis noted. Normal lumbar lordosis preserved. No listhesis. Vertebral body heights are preserved. Signal intensity within the visualized spinal cord is normal. The conus medullaris terminates at the L1 level.  1.9 cm T1/T2 hyperintense benign hemangioma noted within the L3 vertebral body. Degenerative endplate changes seen about the L1-2 and L5-S1 intervertebral disc spaces.  At T12-L1, and diffuse disc desiccation with mild disc bulge. Prominent anterior osteophytes present. No significant facet arthrosis. No canal or neural foraminal stenosis.  At L1-2, there is degenerative disc desiccation with mild diffuse disc bulge and degenerative endplate changes. Prominent osteophytes seen along the right  anterolateral aspect of the spine. No disc protrusion. No significant canal or neural foraminal stenosis.  At L2-3, mild degenerative disc desiccation without significant disc bulge or focal disc protrusion. Prominent bridging anterior osteophytes present. No significant facet arthrosis. No canal or neural foraminal stenosis.  At L3-4, mild diffuse disc bulge with bilateral facet hypertrophy. No focal disc protrusion. There is mild left foraminal narrowing. No significant central canal stenosis.  At L4-5, mild diffuse disc bulge without focal disc protrusion. There is mild bilateral facet hypertrophy. Mild left foraminal narrowing present. No central canal stenosis.  At L5-S1, degenerative disc desiccation with intervertebral disc space narrowing and diffuse disc bulge present. Associated degenerative endplate changes present. Associated annular fissure present posteriorly. No focal disc herniation. No significant canal narrowing. Moderate left with severe right foraminal stenosis present.  Visualized paraspinous soft tissues are within normal limits. Atherosclerotic disease noted within the infrarenal aorta. Visualized kidneys are within normal limits.  IMPRESSION: MRI CERVICAL SPINE IMPRESSION:  1. Mild multilevel degenerative disc disease as detailed above, most severe at C3-4 there is mild canal stenosis. 2. Degenerative disc osteophyte at C3-4 and C4-5 with resultant moderate left foraminal stenosis. MRI THORACIC SPINE IMPRESSION:  Normal MRI of the thoracic spine.  MRI LUMBAR SPINE IMPRESSION:  1. Degenerative disc bulge at L5-S1 with resultant severe right and moderate left foraminal stenosis. 2. Additional mild multilevel degenerative changes as above. No significant canal stenosis identified within the lumbar spine.   Electronically Signed   By: Jeannine Boga M.D.   On: 08/25/2013 02:22   Mr Thoracic Spine Wo Contrast  08/25/2013   CLINICAL DATA:  Transient right leg weakness, clonus.  EXAM: MRI  CERVICAL, THORACIC, AND LUMBAR SPINE WITHOUT CONTRAST  TECHNIQUE: Multiplanar and multiecho pulse sequences of the cervical spine, to include the craniocervical junction and cervicothoracic junction, thoracic spine, and lumbar spine, were obtained without intravenous contrast.  COMPARISON:  Prior MRI of the brain performed on 08/23/2013.  FINDINGS: MRI CERVICAL SPINE FINDINGS:  The visualized portions of the brain and posterior fossa are normal. Craniocervical junction is widely patent.  Vertebral bodies are normally aligned with preservation of the normal cervical lordosis. Vertebral body heights are preserved. Signal intensity within the vertebral body bone marrow and spinal cord are normal.  At C2-3, there is a small central disc protrusion which minimally indents the ventral thecal sac. No canal or neural foraminal stenosis.  At C3-4, there is broad-based degenerative disc osteophyte with left-sided uncovertebral osteophytosis. There is resultant moderate left foraminal stenosis. Posterior disc osteophyte partially effaces the ventral thecal sac and results in mild canal narrowing. No right foraminal stenosis.  At C4-5, there is mild diffuse broad-based degenerative disc osteophyte with left-sided uncovertebral osteophytosis. There is resultant moderate left foraminal stenosis. No central canal narrowing.  At C5-6, mild diffuse degenerative disc osteophyte with moderate bilateral facet arthrosis. There is resultant  mild bilateral foraminal narrowing. No central canal stenosis.  At C6-7, mild diffuse broad-based degenerative disc osteophyte that partially effaces the posterior ventral thecal sac without significant canal stenosis. There is mild bilateral foraminal narrowing, right slightly worse than left.  At C7-T1, no canal or neural foraminal stenosis.  MRI THORACIC SPINE FINDINGS:  The vertebral bodies are normally aligned with preservation of the normal thoracic kyphosis. Vertebral body heights are well  preserved. Signal intensity within the vertebral body bone marrow and spinal cord is normal.  No disc bulge or focal disc protrusion identified within the thoracic spine. No significant canal or neural foraminal stenosis identified.  The visualized paraspinous soft tissues are normal. Visualized lung bases are clear.  MRI LUMBAR SPINE FINDINGS:  Mild levoscoliosis noted. Normal lumbar lordosis preserved. No listhesis. Vertebral body heights are preserved. Signal intensity within the visualized spinal cord is normal. The conus medullaris terminates at the L1 level.  1.9 cm T1/T2 hyperintense benign hemangioma noted within the L3 vertebral body. Degenerative endplate changes seen about the L1-2 and L5-S1 intervertebral disc spaces.  At T12-L1, and diffuse disc desiccation with mild disc bulge. Prominent anterior osteophytes present. No significant facet arthrosis. No canal or neural foraminal stenosis.  At L1-2, there is degenerative disc desiccation with mild diffuse disc bulge and degenerative endplate changes. Prominent osteophytes seen along the right anterolateral aspect of the spine. No disc protrusion. No significant canal or neural foraminal stenosis.  At L2-3, mild degenerative disc desiccation without significant disc bulge or focal disc protrusion. Prominent bridging anterior osteophytes present. No significant facet arthrosis. No canal or neural foraminal stenosis.  At L3-4, mild diffuse disc bulge with bilateral facet hypertrophy. No focal disc protrusion. There is mild left foraminal narrowing. No significant central canal stenosis.  At L4-5, mild diffuse disc bulge without focal disc protrusion. There is mild bilateral facet hypertrophy. Mild left foraminal narrowing present. No central canal stenosis.  At L5-S1, degenerative disc desiccation with intervertebral disc space narrowing and diffuse disc bulge present. Associated degenerative endplate changes present. Associated annular fissure present  posteriorly. No focal disc herniation. No significant canal narrowing. Moderate left with severe right foraminal stenosis present.  Visualized paraspinous soft tissues are within normal limits. Atherosclerotic disease noted within the infrarenal aorta. Visualized kidneys are within normal limits.  IMPRESSION: MRI CERVICAL SPINE IMPRESSION:  1. Mild multilevel degenerative disc disease as detailed above, most severe at C3-4 there is mild canal stenosis. 2. Degenerative disc osteophyte at C3-4 and C4-5 with resultant moderate left foraminal stenosis. MRI THORACIC SPINE IMPRESSION:  Normal MRI of the thoracic spine.  MRI LUMBAR SPINE IMPRESSION:  1. Degenerative disc bulge at L5-S1 with resultant severe right and moderate left foraminal stenosis. 2. Additional mild multilevel degenerative changes as above. No significant canal stenosis identified within the lumbar spine.   Electronically Signed   By: Jeannine Boga M.D.   On: 08/25/2013 02:22   Mr Lumbar Spine Wo Contrast  08/25/2013   CLINICAL DATA:  Transient right leg weakness, clonus.  EXAM: MRI CERVICAL, THORACIC, AND LUMBAR SPINE WITHOUT CONTRAST  TECHNIQUE: Multiplanar and multiecho pulse sequences of the cervical spine, to include the craniocervical junction and cervicothoracic junction, thoracic spine, and lumbar spine, were obtained without intravenous contrast.  COMPARISON:  Prior MRI of the brain performed on 08/23/2013.  FINDINGS: MRI CERVICAL SPINE FINDINGS:  The visualized portions of the brain and posterior fossa are normal. Craniocervical junction is widely patent.  Vertebral bodies are normally aligned with preservation of the  normal cervical lordosis. Vertebral body heights are preserved. Signal intensity within the vertebral body bone marrow and spinal cord are normal.  At C2-3, there is a small central disc protrusion which minimally indents the ventral thecal sac. No canal or neural foraminal stenosis.  At C3-4, there is broad-based  degenerative disc osteophyte with left-sided uncovertebral osteophytosis. There is resultant moderate left foraminal stenosis. Posterior disc osteophyte partially effaces the ventral thecal sac and results in mild canal narrowing. No right foraminal stenosis.  At C4-5, there is mild diffuse broad-based degenerative disc osteophyte with left-sided uncovertebral osteophytosis. There is resultant moderate left foraminal stenosis. No central canal narrowing.  At C5-6, mild diffuse degenerative disc osteophyte with moderate bilateral facet arthrosis. There is resultant mild bilateral foraminal narrowing. No central canal stenosis.  At C6-7, mild diffuse broad-based degenerative disc osteophyte that partially effaces the posterior ventral thecal sac without significant canal stenosis. There is mild bilateral foraminal narrowing, right slightly worse than left.  At C7-T1, no canal or neural foraminal stenosis.  MRI THORACIC SPINE FINDINGS:  The vertebral bodies are normally aligned with preservation of the normal thoracic kyphosis. Vertebral body heights are well preserved. Signal intensity within the vertebral body bone marrow and spinal cord is normal.  No disc bulge or focal disc protrusion identified within the thoracic spine. No significant canal or neural foraminal stenosis identified.  The visualized paraspinous soft tissues are normal. Visualized lung bases are clear.  MRI LUMBAR SPINE FINDINGS:  Mild levoscoliosis noted. Normal lumbar lordosis preserved. No listhesis. Vertebral body heights are preserved. Signal intensity within the visualized spinal cord is normal. The conus medullaris terminates at the L1 level.  1.9 cm T1/T2 hyperintense benign hemangioma noted within the L3 vertebral body. Degenerative endplate changes seen about the L1-2 and L5-S1 intervertebral disc spaces.  At T12-L1, and diffuse disc desiccation with mild disc bulge. Prominent anterior osteophytes present. No significant facet arthrosis.  No canal or neural foraminal stenosis.  At L1-2, there is degenerative disc desiccation with mild diffuse disc bulge and degenerative endplate changes. Prominent osteophytes seen along the right anterolateral aspect of the spine. No disc protrusion. No significant canal or neural foraminal stenosis.  At L2-3, mild degenerative disc desiccation without significant disc bulge or focal disc protrusion. Prominent bridging anterior osteophytes present. No significant facet arthrosis. No canal or neural foraminal stenosis.  At L3-4, mild diffuse disc bulge with bilateral facet hypertrophy. No focal disc protrusion. There is mild left foraminal narrowing. No significant central canal stenosis.  At L4-5, mild diffuse disc bulge without focal disc protrusion. There is mild bilateral facet hypertrophy. Mild left foraminal narrowing present. No central canal stenosis.  At L5-S1, degenerative disc desiccation with intervertebral disc space narrowing and diffuse disc bulge present. Associated degenerative endplate changes present. Associated annular fissure present posteriorly. No focal disc herniation. No significant canal narrowing. Moderate left with severe right foraminal stenosis present.  Visualized paraspinous soft tissues are within normal limits. Atherosclerotic disease noted within the infrarenal aorta. Visualized kidneys are within normal limits.  IMPRESSION: MRI CERVICAL SPINE IMPRESSION:  1. Mild multilevel degenerative disc disease as detailed above, most severe at C3-4 there is mild canal stenosis. 2. Degenerative disc osteophyte at C3-4 and C4-5 with resultant moderate left foraminal stenosis. MRI THORACIC SPINE IMPRESSION:  Normal MRI of the thoracic spine.  MRI LUMBAR SPINE IMPRESSION:  1. Degenerative disc bulge at L5-S1 with resultant severe right and moderate left foraminal stenosis. 2. Additional mild multilevel degenerative changes as above. No significant  canal stenosis identified within the lumbar spine.    Electronically Signed   By: Jeannine Boga M.D.   On: 08/25/2013 02:22       MEDICATIONS                                                                                                                        Scheduled: . amLODipine  2.5 mg Oral Daily  . aspirin EC  81 mg Oral Daily  . atorvastatin  80 mg Oral Daily  . cefTRIAXone (ROCEPHIN)  IV  1 g Intravenous Q24H  . heparin  5,000 Units Subcutaneous 3 times per day  . isosorbide mononitrate  120 mg Oral Daily  . metoprolol tartrate  12.5 mg Oral BID  . morphine  30 mg Oral Q12H  . ranolazine  1,000 mg Oral BID  . sodium chloride  3 mL Intravenous Q12H  . sodium chloride  3 mL Intravenous Q12H    ASSESSMENT/PLAN:                                                                                                           63 year old male with bilateral leg weakness and hyperreflexia. MRI Cervical, thoracic and lumbar spine  All show no spinal stenosis or pathology to account for hyperreflexia. B12 normal.  Patient denies any family members with gait spasticity or history of gait difficulty. At this time cannot find etiology for patients increased tone and hyperreflexia.    Recommend: 1) Continue PT for strength and gait training.   2) Do not recommend any further neurological work up at this time.   Neurology S/O  Assessment and plan discussed with with attending physician and they are in agreement.    Etta Quill PA-C [facility] [phone]  08/25/2013, 10:07 AM

## 2013-08-25 NOTE — Evaluation (Signed)
Physical Therapy Evaluation Patient Details Name: Chris Payne MRN: [id number] DOB: [birth year] Today's Date: 08/25/2013   History of Present Illness  Chris Payne is a 63 y.o. male presenting with transient right leg weakness . PMH is significant for CAD, HTN, PAD, AAA, carotid stenosis, CVA   Clinical Impression  Pt adm due to the above. Pt limited in functional mobility and decreased independence with transfers and mobility secondary to deficits indicated below. Pt to benefit from acute PT to address deficits and maximize functional mobility prior to D/C home. Pt refusing SNF for post acute PT; PT will have 24/7 (A) and can use wheelchair for mobility to prevent falls.     Follow Up Recommendations Home health PT;Supervision/Assistance - 24 hour    Equipment Recommendations  None recommended by PT    Recommendations for Other Services OT consult     Precautions / Restrictions Precautions Precautions: Fall Precaution Comments: 1 fall 3 weeks ago per pt  Restrictions Weight Bearing Restrictions: No      Mobility  Bed Mobility Overal bed mobility: Modified Independent             General bed mobility comments: incr time for bed mobility   Transfers Overall transfer level: Needs assistance Equipment used: Rolling walker (2 wheeled) Transfers: Sit to/from Stand Sit to Stand: Min assist         General transfer comment: (A) to achieve upright standing position and cues for hand placement and sequencing; pt with Lt knee flexed throughout transfer; able to WB through LEs; no tremors noted in standing   Ambulation/Gait Ambulation/Gait assistance: Min assist Ambulation Distance (Feet): 12 Feet Assistive device: Rolling walker (2 wheeled) Gait Pattern/deviations: Wide base of support;Trunk flexed;Antalgic;Decreased stride length;Step-through pattern (knees flexed ) Gait velocity: decreased  Gait velocity interpretation: Below normal speed for age/gender General  Gait Details: pt unsteady with gt; c/o back pain; requires min (A) to maintain balance and manage RW; will need physical (A) for mobility at home or be in his wheelchair for safe mobility; pt is a fall risk  Stairs            Wheelchair Mobility    Modified Rankin (Stroke Patients Only)       Balance Overall balance assessment: History of Falls;Needs assistance Sitting-balance support: Feet supported;No upper extremity supported Sitting balance-Leahy Scale: Fair Sitting balance - Comments: LE tremors    Standing balance support: During functional activity;Bilateral upper extremity supported Standing balance-Leahy Scale: Poor Standing balance comment: pt unsteady; requires (A) to maintain balance and UEs supported                             Pertinent Vitals/Pain C/o back pain reports it is chronic; did not rate     Home Living Family/patient expects to be discharged to:: Private residence Living Arrangements: Spouse/significant other;Children Available Help at Discharge: Family;Available 24 hours/day Type of Home: Mobile home Home Access: Ramped entrance     Home Layout: One level Home Equipment: [employer] - single point;Wheelchair - [employer] - 4 wheels;Hospital bed;Shower seat;Adaptive equipment Additional Comments: reports he has an elevated toilet seat     Prior Function Level of Independence: Needs assistance   Gait / Transfers Assistance Needed: pt reports he was independent with ambulation with his RW in house ; reports he has wheelchair for increased ambulation distance   ADL's / Homemaking Assistance Needed: requires (A) with LE dressing and bathing  Hand Dominance   Dominant Hand: Right    Extremity/Trunk Assessment   Upper Extremity Assessment: Defer to OT evaluation           Lower Extremity Assessment: RLE deficits/detail;LLE deficits/detail RLE Deficits / Details: increased tremors with MMT; able to WB through LEs in  standing  LLE Deficits / Details: increased tremors with MMT; was able to WB through LEs  Cervical / Trunk Assessment: Normal  Communication   Communication: No difficulties  Cognition Arousal/Alertness: Awake/alert Behavior During Therapy: WFL for tasks assessed/performed Overall Cognitive Status: No family/caregiver present to determine baseline cognitive functioning                      General Comments General comments (skin integrity, edema, etc.): pt refusing post acute rehab other than HHPT at this time; insistent on returning home; may be able to use wheelchair for mobility     Exercises        Assessment/Plan    PT Assessment Patient needs continued PT services  PT Diagnosis Difficulty walking;Generalized weakness   PT Problem List Decreased strength;Decreased balance;Decreased mobility;Decreased cognition;Decreased safety awareness;Decreased knowledge of use of DME;Impaired sensation  PT Treatment Interventions DME instruction;Gait training;Functional mobility training;Therapeutic activities;Therapeutic exercise;Balance training;Neuromuscular re-education;Patient/family education   PT Goals (Current goals can be found in the Care Plan section) Acute Rehab PT Goals Patient Stated Goal: to return home PT Goal Formulation: With patient Time For Goal Achievement: 09/01/13 Potential to Achieve Goals: Good    Frequency Min 3X/week   Barriers to discharge        Co-evaluation               End of Session Equipment Utilized During Treatment: Gait belt Activity Tolerance: Patient tolerated treatment well Patient left: in chair;with call bell/phone within reach;with chair alarm set;Other (comment) (OT present for evaluation ) Nurse Communication: Mobility status         Time: 7915-0413 PT Time Calculation (min): 23 min   Charges:   PT Evaluation $Initial PT Evaluation Tier I: 1 Procedure PT Treatments $Gait Training: 8-22 mins   PT G CodesKennis Carina [place], [place]  [phone] 08/25/2013, 12:57 PM

## 2013-08-26 DIAGNOSIS — M519 Unspecified thoracic, thoracolumbar and lumbosacral intervertebral disc disorder: Secondary | ICD-10-CM

## 2013-08-26 DIAGNOSIS — Z9889 Other specified postprocedural states: Secondary | ICD-10-CM

## 2013-08-26 LAB — URINE CULTURE: Colony Count: >=100000

## 2013-08-26 MED ORDER — ROSUVASTATIN CALCIUM 40 MG PO TABS: 40.0000 mg | ORAL_TABLET | Freq: Every day | ORAL | Status: DC

## 2013-08-26 MED ORDER — CEPHALEXIN 500 MG PO CAPS: 500.0000 mg | ORAL_CAPSULE | Freq: Three times a day (TID) | ORAL | Status: DC

## 2013-08-26 MED ORDER — CARVEDILOL 3.125 MG PO TABS: 3.1250 mg | ORAL_TABLET | Freq: Every day | ORAL | Status: DC

## 2013-08-26 NOTE — Discharge Instructions (Signed)
[name], Chris Payne were seen in the hospital for lower extremity weakness, concerning for stroke. We obtained imaging of your head and were reassured that this looked normal. We also looked at your spine given your complaints of bladder issues, weakness and some tremors. These showed degeneration of your vertebrae which is similar to arthritis that you can have anywhere in your body. It will be important when you go home to work closely with physical therapy to strengthen your muscles around these bones and prevent accidents. It will also be important for you to follow up with the neurologist, by calling to schedule an appointment with them. Should you develop worsening tremors, loss of bowel or bladder function or increased confusion call 911 right away as these could be signs of a medical emergency.  Medication changes during this admission: - we changed your cholesterol medicine from lipitor (atorvastatin) to crestor (rosuvastatin) as this is a stronger medication - you will take 4 more days of antibiotic (cephalexin) for your urinary tract infection - we stopped your metoprolol and switched you to carvedilol  These new medications have been sent to your pharmacy.

## 2013-08-26 NOTE — Progress Notes (Signed)
Family Medicine Teaching Service Daily Progress Note Intern Pager: [phone]  Patient name: Chris Payne Medical record number: [id number] Date of birth: [birth year] Age: 63 y.o. Gender: male  Primary Care Provider: Ellsworth Lennox, MD Consultants: neuro Code Status: full  Pt Overview and Major Events to Date:   Assessment and Plan: Chris Payne is a 63 y.o. male presenting with transient right leg weakness . PMH is significant for CAD, HTN, PAD, AAA, carotid stenosis, CVA.  # Neuro:Transient right sided muscle weakness: patient has significant vascular disease. Has previous carotid dopplers showing 29-79% occulusion of LICA in March 8921. Last echo showed severe hypokinesis of apex and EF of 50-55% (10/05/2013). MRI brain shows no acute pathology.However gives a report of recent falls, worsening lower extremity tremors in last 2 months 3 weeks ago some bladder hesitancy. MR spine with multilevel degenerative changes. Physical exam notable for multiple beats of clonus bilaterally and hyperreflexia. Concerning for UMN lesion (transverse myelitis, MS, ALS, demyelinating d/o) vs vitamin deficiency vs infectious etiology (HIV/syphilis) ;B12, TSH, RPR, HIV all wnl -Cardiac monitoring  -Neurology now signed off, no further inpatient workup -will provide info for pt to schedule outpatient neuro f/u  # CV: CAD with history of ischemic stroke and MI, AAA/ Hypertension Previously on metoprolol as a home medication, given above. HR 50s-60s. Repeat ECHO EF 55-60 without thrombus -Continue home aspirin, switch atorvastatin to crestor as outpt given risk factors and need for secondary prevention. -Continue Imdur and Ranexa -LICA currently within range for intervention with CEA. vascular surgery outpatient follow-up -Continue home amlodipine, switched metoprolol to carvedilol  # Uro: UTI: patient's urinalysis suggestive of infection. Has dysuria symptoms as well. No frequency, fevers, no CVA  tenderness  -Ceftriaxone 1g q24 while inpatient -urine culture pending - currently growing GNR so will switch to keflex 500mg  TID and f/u culture  # MSK: Chronic back pain  -Continue MS contin 30mg  BID  -Morphine 2mg  q2 PRN -Pt/ot consults -> recommend HH PT, ordered  FEN/GI: Heart healthy, saline lock  Prophylaxis: Heparin subq  Disposition: likely d/c home today once [facility] PT set up  Subjective:  No complaints this morning. Has mild cough. Denies having any chest pain. States leg pain is improved. Feels ready to go home today.  Objective: Temp:  [97.4 F (36.3 C)-98.5 F (36.9 C)] 98.5 F (36.9 C) (05/02 0544) Pulse Rate:  [50-54] 51 (05/02 0544) Resp:  [20] 20 (05/02 0544) BP: (119-151)/(65-85) 151/85 mmHg (05/02 0544) SpO2:  [92 %-97 %] 93 % (05/02 0544) Physical Exam: General: Lying in bed, in no acute distress,   HEENT: NCAT, face symmetric, EOMI Cardiovascular: Regular rate rhythm, no murmurs Respiratory: Coarse breath sounds bilaterally via anterior auscultation, good air movement, chronic coarse cough Abdomen: Soft, non-tender, non-distended, +BS Extremities: WWP Neuro: Alert, oriented x3, no obvious cranial nerve deficit, weakness 3/5 with hip flexion bilaterally, grip 5/5 bilaterally  sustained clonus at bilateral ankles L worse than R  Laboratory:  Recent Labs Lab 08/23/13 1736 08/23/13 1918  WBC 6.3 6.9  HGB 12.9* 13.8  HCT 40.9* 40.8  PLT  --  242    Recent Labs Lab 08/23/13 1736 08/23/13 1918  NA 136 135*  K 5.0 4.6  CL 98 96  CO2 28 26  BUN 16 18  CREATININE 0.86 0.89  CALCIUM 9.6 9.3  PROT 7.0 7.1  BILITOT 0.8 0.6  ALKPHOS 90 91  ALT 15 15  AST 15 16  GLUCOSE 95 97    Imaging/Diagnostic Tests: IMPRESSION:  MRI HEAD IMPRESSION: 1. No acute intracranial infarct or other abnormality identified within the brain. 2. Age-related atrophy with moderate chronic microvascular ischemic disease involving the supratentorial white matter, advanced  relative to prior MRI from 10/10/2009. 3. Remote pontine, left frontal lobe, right basal ganglia, and left cerebellar infarcts.   MRA HEAD IMPRESSION: 1. Small but patent right vertebral artery to the level of the right PICA. 2. 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 There is flow within the posterior cerebral arteries bilaterally, likely via collateral circulation from the anterior circulation. Overall, these findings are not significantly changed relative to previous MRA from 10/10/2009  CXR 08/23/13 IMPRESSION: Emphysema. No acute cardiopulmonary abnormality  08/24/13 IMPRESSION:  MRI CERVICAL SPINE IMPRESSION:  1. Mild multilevel degenerative disc disease as detailed above, most  severe at C3-4 there is mild canal stenosis.  2. Degenerative disc osteophyte at C3-4 and C4-5 with resultant  moderate left foraminal stenosis.  MRI THORACIC SPINE IMPRESSION:  Normal MRI of the thoracic spine.  MRI LUMBAR SPINE IMPRESSION:  1. Degenerative disc bulge at L5-S1 with resultant severe right and  moderate left foraminal stenosis.  2. Additional mild multilevel degenerative changes as above. No  significant canal stenosis identified within the lumbar spine   Leeanne Rio, MD 08/26/2013, 9:38 AM PGY-2, [facility] Intern pager: [phone], text pages welcome

## 2013-08-26 NOTE — Progress Notes (Signed)
Physical Therapy Treatment Patient Details Name: Chris Payne MRN: [id number] DOB: [birth year] Today's Date: 08/26/2013    History of Present Illness Chris Payne is a 63 y.o. male presenting with transient right leg weakness . PMH is significant for CAD, HTN, PAD, AAA, carotid stenosis, CVA     PT Comments    Pt minimally able to progress mobility. Self limiting in distance; refusing to ambulate in hallway. Pt at min guard level for mobility and supervision for sit to stand transfers. Pt plans to d/C home today. Reports he will have 24/7 (A) upon acute D/C.   Follow Up Recommendations  Home health PT;Supervision/Assistance - 24 hour     Equipment Recommendations  None recommended by PT    Recommendations for Other Services OT consult     Precautions / Restrictions Precautions Precautions: Fall Precaution Comments: 1 fall 3 weeks ago per pt  Restrictions Weight Bearing Restrictions: No    Mobility  Bed Mobility               General bed mobility comments: not assessed; pt sitting up in chair and returned to chair   Transfers Overall transfer level: Needs assistance Equipment used: Rolling walker (2 wheeled) Transfers: Sit to/from Stand Sit to Stand: Supervision         General transfer comment: supervision for safety and cues for technique   Ambulation/Gait Ambulation/Gait assistance: Min guard Ambulation Distance (Feet): 20 Feet Assistive device: Rolling walker (2 wheeled) Gait Pattern/deviations: Decreased step length - right;Decreased stance time - left;Wide base of support;Trunk flexed Gait velocity: decreased  Gait velocity interpretation: Below normal speed for age/gender General Gait Details: pt self limiting with gt; refusing to ambulate in hallway; c/o back back; min guard to steady and for safety. cues throughout for sequencing and management of RW    Stairs            Wheelchair Mobility    Modified Rankin (Stroke Patients Only)        Balance Overall balance assessment: History of Falls;Needs assistance         Standing balance support: During functional activity;Bilateral upper extremity supported Standing balance-Leahy Scale: Poor Standing balance comment: relies heavily on bil UEs                     Cognition Arousal/Alertness: Awake/alert Behavior During Therapy: WFL for tasks assessed/performed Overall Cognitive Status: No family/caregiver present to determine baseline cognitive functioning                      Exercises General Exercises - Lower Extremity Ankle Circles/Pumps: AROM;Both;10 reps;Seated Heel Slides: AAROM;Both;10 reps;Seated Other Exercises Other Exercises: edcuated on DF stretch with towel to stretch calf     General Comments General comments (skin integrity, edema, etc.): educated on HEP to stretch calf; noted spasticity in Lt LE with heel slides       Pertinent Vitals/Pain C/o "back pain that i always have"; did not rate pain     Home Living                      Prior Function            PT Goals (current goals can now be found in the care plan section) Acute Rehab PT Goals Patient Stated Goal: home today PT Goal Formulation: With patient Time For Goal Achievement: 09/01/13 Potential to Achieve Goals: Good Progress towards PT goals: Progressing toward goals  Frequency  Min 3X/week    PT Plan Current plan remains appropriate    Co-evaluation             End of Session Equipment Utilized During Treatment: Gait belt Activity Tolerance: Patient tolerated treatment well Patient left: in chair;with call bell/phone within reach;with chair alarm set;Other (comment)     Time: 7096-2836 PT Time Calculation (min): 11 min  Charges:  $Gait Training: 8-22 mins                    G Codes:      [place], [place]  [phone] 08/26/2013, 12:55 PM

## 2013-08-26 NOTE — Progress Notes (Signed)
Pt discharged home for home health PT. Case manager on call wonder made aware. Will call pt and sent up home PT with him AVS  Discharged and follow up instruction given to pt and son in  In law  Both verbalized good understanding. Condition at discharge was stable.

## 2013-08-26 NOTE — Progress Notes (Signed)
FMTS Attending Note  I personally saw and evaluated the patient. The plan of care was discussed with the resident team. I agree with the assessment and plan as documented by the resident.   63 y/o male admitted for weakness and dizziness. MRI brain/c-spine/thoracic-spine/lumbar spine negative for acute process. Lumbar spine MRI showed degenerative changes which may be contributing to his LE weakness. Patient states that the dizziness has resolved and his strength has shown mild improvement. He was also diagnosed with UTI during hospital stay and will complete outpatient course of Keflex. Patient will need outpatient follow up with Neurology/PCP/and Urology. Consider outpatient referral to Orthopedics/Neurosurgery for further evaluation of Lumbar spine. Patient would also benefit from Vascular Surgery consultation as an outpatient due to significant stenosis of the LICA.   Dossie Arbour MD

## 2013-08-26 NOTE — Care Management Note (Signed)
    Page 1 of 1   08/26/2013     4:49:50 PM CARE MANAGEMENT NOTE 08/26/2013  Patient:  Chris Payne, Chris Payne   Account Number:  [id number]  Date Initiated:  08/25/2013  Documentation initiated by:  Lorne Skeens  Subjective/Objective Assessment:   patient admitted with lower extremity weakness, UTI. Lives at home with spouse.     Action/Plan:   Will follow for discharge needs   Anticipated DC Date:  08/26/2013   Anticipated DC Plan:  [place]  CM consult      [facility] Choice  HOME HEALTH   Choice offered to / List presented to:  C-1 Patient        [place] arranged  HH-2 PT      [place]   Status of service:  Completed, signed off Medicare Important Message given?   (If response is "NO", the following Medicare IM given date fields will be blank) Date Medicare IM given:   Date Additional Medicare IM given:    Discharge Disposition:  [place]  Per UR Regulation:    If discussed at Long Length of Stay Meetings, dates discussed:    Comments:  08/26/13 16:45 CM spoke with pt at home to offer choice for HHPT.  Pt chooses [facility] to render HHPT.  Address and contact number verified with pt.  F2F completed.  Referral called to [facility], Rhett Bannister.  Pt states he has a hospital bed, wheelchair, roling walker, commode and does not need anything else. No other Cm needs were communicated.  Chris Payne, BSN, CM [phone].

## 2013-08-27 NOTE — Discharge Summary (Signed)
I agree with the discharge summary as documented.   Orazio Weller MD  

## 2013-08-28 ENCOUNTER — Telehealth: Payer: Self-pay

## 2013-08-28 NOTE — Telephone Encounter (Signed)
Cira Servant, a nurse for physical therapy, called for the patient saying he was just released from the hospital on Saturday.  They need authorization to do in-home physical therapy, twice a week for 5 weeks.  Constance Haw can be reached at [phone].

## 2013-08-28 NOTE — Telephone Encounter (Signed)
Almira wil send form for authorization for in-home PT though the original order was given prior to discharge. I have agree to authorize ongoing therapy.

## 2013-08-29 ENCOUNTER — Encounter: Payer: Medicare HMO | Admitting: Vascular Surgery

## 2013-08-30 ENCOUNTER — Telehealth: Payer: Self-pay | Admitting: *Deleted

## 2013-08-30 DIAGNOSIS — M6281 Muscle weakness (generalized): Secondary | ICD-10-CM

## 2013-08-30 NOTE — Telephone Encounter (Signed)
[facility] is calliing. Pt has [place] and was referred to their office for Transient Right Sided Muscle Weakness from the ED.  Pt needs a referral from here to see pt. We are listed as PCP (Dr. Leward Quan) Dr. Tamala Julian saw him 4/29 for dizziness and sent to the ED.

## 2013-08-30 NOTE — Telephone Encounter (Signed)
Neurology ref signed

## 2013-08-31 ENCOUNTER — Telehealth: Payer: Self-pay

## 2013-08-31 NOTE — Telephone Encounter (Signed)
Verbal consent to resume Occupational Therapy.  Please all Chris Payne with [facility].

## 2013-08-31 NOTE — Telephone Encounter (Signed)
Chris Payne WITH [facility] CALLED.  SHE NEEDS VERBAL CONSENT TO RESUME OCCUPATIONAL THERAPY.  PLEASE CALL [phone]

## 2013-09-01 NOTE — Telephone Encounter (Signed)
Spoke to Diane at [facility].  Advised that Dr. Tamala Julian gives verbal consent to resume OT. She verbalized understanding.

## 2013-09-05 ENCOUNTER — Ambulatory Visit (INDEPENDENT_AMBULATORY_CARE_PROVIDER_SITE_OTHER): Payer: Commercial Managed Care - HMO | Admitting: Neurology

## 2013-09-05 ENCOUNTER — Encounter: Payer: Self-pay | Admitting: Neurology

## 2013-09-05 VITALS — Ht 67.0 in | Wt 170.0 lb

## 2013-09-05 DIAGNOSIS — M545 Low back pain, unspecified: Secondary | ICD-10-CM

## 2013-09-05 DIAGNOSIS — R269 Unspecified abnormalities of gait and mobility: Secondary | ICD-10-CM

## 2013-09-05 NOTE — Progress Notes (Signed)
PATIENT: Chris Payne DOB: 1950-09-02  HISTORICAL  Chris Payne is a 63 years old right-handed Caucasian male, overall at visit, he is referred  by his primary care physician Dr. Dolores Frame for evaluation of gait difficulty, worsening low back pain  He had a past medical history of coronary artery disease, status post stent, also had a history of left hip replacement about 4 years ago, in his twenties, he has no significant left hip pain, but has limited range of motion of left leg, he also had a history of aortic aneurysm, status post repair, hypertension, peripheral vascular disease, COPD, stroke, involving the left side of his body,  He complains of chronic low back pain, worsened since 2012, his low back pain staying at his midline low back, he denies significant radiating pain to his lower extremity, it is hard for him to stretch out his back,  He always walked with a limp since his left hip replacement, now he walks bending down his back, significant low back pain, he denies bowel and bladder incontinence,  REVIEW OF SYSTEMS: Full 14 system review of systems performed and notable only for swelling in legs, constipation, restless legs  ALLERGIES: No Known Allergies  HOME MEDICATIONS: Current Outpatient Prescriptions on File Prior to Visit  Medication Sig Dispense Refill  . amLODipine (NORVASC) 2.5 MG tablet Take 1 tablet (2.5 mg total) by mouth daily.  30 tablet  11  . aspirin EC 81 MG tablet Take 81 mg by mouth daily.      . carvedilol (COREG) 3.125 MG tablet Take 1 tablet (3.125 mg total) by mouth daily with breakfast.  30 tablet  1  . cephALEXin (KEFLEX) 500 MG capsule Take 1 capsule (500 mg total) by mouth 3 (three) times daily. For 4 more days  12 capsule  0  . gabapentin (NEURONTIN) 300 MG capsule Take 1 capsule (300mg ) in the morning and 2 capsules (600mg ) at bedtime  90 capsule  3  . HYDROcodone-acetaminophen (NORCO) 10-325 MG per tablet Take 2 tablets by mouth every  12 (twelve) hours as needed for moderate pain.       . isosorbide mononitrate (IMDUR) 120 MG 24 hr tablet Take 1 tablet (120 mg total) by mouth daily.  30 tablet  5  . morphine (MS CONTIN) 30 MG 12 hr tablet Take 30 mg by mouth every 12 (twelve) hours.       . nitroGLYCERIN (NITROSTAT) 0.4 MG SL tablet Place 0.4 mg under the tongue every 5 (five) minutes as needed for chest pain.       . ranolazine (RANEXA) 1000 MG SR tablet Take 1 tablet (1,000 mg total) by mouth 2 (two) times daily.  60 tablet  5  . rosuvastatin (CRESTOR) 40 MG tablet Take 1 tablet (40 mg total) by mouth daily.  30 tablet  1   No current facility-administered medications on file prior to visit.    PAST MEDICAL HISTORY: Past Medical History  Diagnosis Date  . Hypertension   . Stroke     h/o pontine CVA  . AAA (abdominal aortic aneurysm)     s/p repair 6/11  . Hyperlipidemia   . CAD (coronary artery disease)     s/p CABG 2/12:  LIMA to LAD, SVG to diagonal-1, SVG to ramus intermedius,, SVG to AM (Dr. Roxan Hockey) ;  b.  Myoview 10/13:  inf infarct with very mild peri-infarct ischemia, EF 49%, inf HK; c. Cath 2/25 showed 2/4 occluded grafts & high grade  stenosis in the LAD distal to the LIMA insertion, not amenable to PCI - Med Rx  . Carotid stenosis     a.  dopplers 1/61: LICA 09-60%;  b. Carotid dopplers 3/14:  R 0-39%, L 60-79%, repeat 6 mos  . PAD (peripheral artery disease)     ABIs 4/12:  R 0.88, L 0.92; R SFA 40%, L CFA 50%, L SFA 50-60%  . COPD (chronic obstructive pulmonary disease)   . Inguinal hernia   . Thyroid nodule     incidental finding on carotid doppler 3/14 => thyroid U/S ordered  . Myocardial infarction   . Anginal pain     occ last 1-2 months ago  . Shortness of breath   . Pneumonia     hx  . GERD (gastroesophageal reflux disease)     PAST SURGICAL HISTORY: Past Surgical History  Procedure Laterality Date  . Hip surgery  40 years ago    left hip bone removal and pinning  . Abdominal  aortic aneurysm repair  10-03-2009  . Exploratory laparotomy  09/2009    ligation of lumbar arteries  . Coronary artery bypass graft      LIMA to the LAD, SVG to first diagonal, SVG to ramus intermediate, SVG to acute marginal. EF 50%. 2/12  . External ear surgery Left     laceration child  . Inguinal hernia repair Left 07/21/2012    Procedure: HERNIA REPAIR INGUINAL ADULT;  Surgeon: Merrie Roof, MD;  Location: [place];  Service: General;  Laterality: Left;  . Insertion of mesh Left 07/21/2012    Procedure: INSERTION OF MESH;  Surgeon: Merrie Roof, MD;  Location: [place];  Service: General;  Laterality: Left;  . Exploration post operative open heart      FAMILY HISTORY: Family History  Problem Relation Age of Onset  . Alcohol abuse Father   . Cancer Sister     SOCIAL HISTORY:  History   Social History  . Marital Status: Married    Spouse Name: Rod Holler    Number of Children: 0  . Years of Education: 10   Occupational History  .      Retired   Social History Main Topics  . Smoking status: Former Smoker -- 1.00 packs/day for 30 years    Types: Cigarettes    Quit date: 01/25/2010  . Smokeless tobacco: Never Used  . Alcohol Use: No     Comment: former drinker - Quit 2011.  . Drug Use: No  . Sexual Activity: No   Other Topics Concern  . Not on file   Social History Narrative   Retired Journalist, newspaper.  Lives with wife Rod Holler) and stepson. Married x 13 years.   Education 10th grade.   Right handed.   Caffeine five cups of coffee daily.     PHYSICAL EXAM   Filed Vitals:   09/05/13 0900  Height: 5\' 7"  (1.702 m)  Weight: 170 lb (77.111 kg)    Not recorded    Body mass index is 26.62 kg/(m^2).   Generalized: In no acute distress  Neck: Supple, no carotid bruits   Cardiac: Regular rate rhythm  Pulmonary: Clear to auscultation bilaterally  Musculoskeletal: No deformity  Neurological examination  Mentation: Alert oriented to time, place, history taking,  and causual conversation  Cranial nerve II-XII: Pupils were equal round reactive to light. Extraocular movements were full.  Visual field were full on confrontational test. Bilateral fundi were sharp.  Facial sensation and  strength were normal. Hearing was intact to finger rubbing bilaterally. Uvula tongue midline.  Head turning and shoulder shrug and were normal and symmetric.Tongue protrusion into cheek strength was normal.  Motor: He has no significant bilateral upper extremity weakness, he has mild bilateral lower extremity spasticity, mild bilateral hip flexion, ankle dorsi flexion weakness, left worse than right,  Sensory: Length dependent decreased fine touch, pinprick to mid shin level,, absent vibratory sensation to mid shin, and preserved proprioception at toes.  Coordination: Normal finger to nose, heel-to-shin bilaterally there was no truncal ataxia  Gait: Need assessment to get up from seated position, dragging his left leg, stiff, unsteady, forward bending position.  Romberg signs: Negative  Deep tendon reflexes: Brachioradialis 2/2, biceps 2/2, triceps 2/2, patellar 33, Achilles 3/3, sustained bilateral ankle clonus, left worse than right, plantar responses were extensor bilaterally.   DIAGNOSTIC DATA (LABS, IMAGING, TESTING) - I reviewed patient records, labs, notes, testing and imaging myself where available.  Lab Results  Component Value Date   WBC 6.9 08/23/2013   HGB 13.8 08/23/2013   HCT 40.8 08/23/2013   MCV 99.5 08/23/2013   PLT 242 08/23/2013      Component Value Date/Time   NA 135* 08/23/2013 1918   K 4.6 08/23/2013 1918   CL 96 08/23/2013 1918   CO2 26 08/23/2013 1918   GLUCOSE 97 08/23/2013 1918   BUN 18 08/23/2013 1918   CREATININE 0.89 08/23/2013 1918   CREATININE 0.86 08/23/2013 1736   CALCIUM 9.3 08/23/2013 1918   PROT 7.1 08/23/2013 1918   ALBUMIN 3.1* 08/23/2013 1918   AST 16 08/23/2013 1918   ALT 15 08/23/2013 1918   ALKPHOS 91 08/23/2013 1918   BILITOT 0.6  08/23/2013 1918   GFRNONAA 88* 08/23/2013 1918   GFRAA >90 08/23/2013 1918   Lab Results  Component Value Date   CHOL 188 07/04/2013   HDL 43 07/04/2013   LDLCALC 129* 07/04/2013   TRIG 81 07/04/2013   CHOLHDL 4.4 07/04/2013   Lab Results  Component Value Date   HGBA1C 5.4 08/10/2011   Lab Results  Component Value Date   VITAMINB12 629 08/24/2013   Lab Results  Component Value Date   TSH 1.680 08/24/2013      ASSESSMENT AND PLAN  Chris Payne is a 63 y.o. 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 MRI of cervical spine, MRI of lumbar spine, EMG nerve conduction study     Chris Payne, M.D. Ph.D.  [facility] [address], [place], [place] [address] [phone]

## 2013-09-05 NOTE — ED Provider Notes (Signed)
Medical screening examination/treatment/procedure(s) were performed by non-physician practitioner and as supervising physician I was immediately available for consultation/collaboration.   EKG Interpretation   Date/Time:  Wednesday August 23 2013 19:06:02 EDT Ventricular Rate:  57 PR Interval:  178 QRS Duration: 108 QT Interval:  474 QTC Calculation: 461 R Axis:   -21 Text Interpretation:  Sinus rhythm Borderline left axis deviation Low  voltage, precordial leads ED PHYSICIAN INTERPRETATION AVAILABLE IN [place]  [facility] Confirmed by TEST, Record ([id number]) on 08/25/2013 12:30:47 PM        Tanna Furry, MD 09/05/13 1919

## 2013-09-08 ENCOUNTER — Telehealth: Payer: Self-pay

## 2013-09-08 NOTE — Telephone Encounter (Signed)
THE PHYSICAL THERAPIST WITH [facility] CALLED REQUESTING A VERBAL AUTHORIZATION FROM DR [facility] TO EXTEND HIS P.T.  FOR A BRIEF PERIOD OF TIME. SHE STATES HE IS STILL HAVING RIGHT LEG PAIN AND IS NOT SHOWING PROGRESS. [address] Jodelle Gross [facility] @ [phone]

## 2013-09-08 NOTE — Telephone Encounter (Signed)
He is to see you next week, can we extend the PT?

## 2013-09-09 NOTE — Telephone Encounter (Signed)
I phoned [facility] and left msg extending this pt's PT.

## 2013-09-12 NOTE — Telephone Encounter (Signed)
Chris Payne PT IS COMPLAINING OF A UTI AND THE LAST TIME HE WAS HOSPITALIZED HE DID HAVE A UTI AT THAT TIME YOU MAY REACH HER AT [phone] IF NEEDED

## 2013-09-12 NOTE — Telephone Encounter (Signed)
Olean Ree is pt's PT. Advised her to let pt know to RTC

## 2013-09-14 ENCOUNTER — Other Ambulatory Visit: Payer: Medicare HMO

## 2013-09-14 ENCOUNTER — Ambulatory Visit: Payer: Commercial Managed Care - HMO | Admitting: Family Medicine

## 2013-09-15 ENCOUNTER — Other Ambulatory Visit: Payer: Medicare HMO

## 2013-09-15 ENCOUNTER — Ambulatory Visit: Payer: Medicare HMO | Admitting: Cardiology

## 2013-09-16 ENCOUNTER — Ambulatory Visit
Admission: RE | Admit: 2013-09-16 | Discharge: 2013-09-16 | Disposition: A | Discharge status: Home / Self Care | Payer: Medicare Other | Source: Ambulatory Visit | Attending: Neurology | Admitting: Neurology

## 2013-09-16 ENCOUNTER — Ambulatory Visit
Admission: RE | Admit: 2013-09-16 | Discharge: 2013-09-16 | Disposition: A | Discharge status: Home / Self Care | Payer: Commercial Managed Care - HMO | Source: Ambulatory Visit | Attending: Neurology | Admitting: Neurology

## 2013-09-16 DIAGNOSIS — M545 Low back pain, unspecified: Secondary | ICD-10-CM

## 2013-09-16 DIAGNOSIS — R269 Unspecified abnormalities of gait and mobility: Secondary | ICD-10-CM

## 2013-09-21 ENCOUNTER — Ambulatory Visit (INDEPENDENT_AMBULATORY_CARE_PROVIDER_SITE_OTHER)
Admission: RE | Admit: 2013-09-21 | Discharge: 2013-09-21 | Disposition: A | Discharge status: Home / Self Care | Payer: Medicare HMO | Source: Ambulatory Visit | Attending: Cardiology | Admitting: Cardiology

## 2013-09-21 DIAGNOSIS — I77810 Thoracic aortic ectasia: Secondary | ICD-10-CM

## 2013-09-21 MED ORDER — IOHEXOL 350 MG/ML SOLN
100.0000 mL | Freq: Once | INTRAVENOUS | Status: AC | PRN
  Administered 2013-09-21: 100 mL via INTRAVENOUS

## 2013-09-22 ENCOUNTER — Emergency Department (HOSPITAL_COMMUNITY): Payer: Medicare HMO

## 2013-09-22 ENCOUNTER — Encounter (HOSPITAL_COMMUNITY): Payer: Self-pay | Admitting: Emergency Medicine

## 2013-09-22 ENCOUNTER — Inpatient Hospital Stay (HOSPITAL_COMMUNITY)
Admission: EM | Admit: 2013-09-22 | Discharge: 2013-09-23 | DRG: 069 | Disposition: A | Discharge status: Home / Self Care | Payer: Medicare HMO | Attending: Family Medicine | Admitting: Family Medicine

## 2013-09-22 ENCOUNTER — Observation Stay (HOSPITAL_COMMUNITY): Payer: Medicare HMO

## 2013-09-22 ENCOUNTER — Telehealth: Payer: Self-pay

## 2013-09-22 DIAGNOSIS — I119 Hypertensive heart disease without heart failure: Secondary | ICD-10-CM | POA: Diagnosis present

## 2013-09-22 DIAGNOSIS — M549 Dorsalgia, unspecified: Secondary | ICD-10-CM | POA: Diagnosis present

## 2013-09-22 DIAGNOSIS — G8929 Other chronic pain: Secondary | ICD-10-CM | POA: Diagnosis present

## 2013-09-22 DIAGNOSIS — Z9889 Other specified postprocedural states: Secondary | ICD-10-CM

## 2013-09-22 DIAGNOSIS — M48061 Spinal stenosis, lumbar region without neurogenic claudication: Secondary | ICD-10-CM | POA: Diagnosis present

## 2013-09-22 DIAGNOSIS — R258 Other abnormal involuntary movements: Secondary | ICD-10-CM

## 2013-09-22 DIAGNOSIS — Z8679 Personal history of other diseases of the circulatory system: Secondary | ICD-10-CM

## 2013-09-22 DIAGNOSIS — I2510 Atherosclerotic heart disease of native coronary artery without angina pectoris: Secondary | ICD-10-CM | POA: Diagnosis present

## 2013-09-22 DIAGNOSIS — I6529 Occlusion and stenosis of unspecified carotid artery: Secondary | ICD-10-CM | POA: Diagnosis present

## 2013-09-22 DIAGNOSIS — I739 Peripheral vascular disease, unspecified: Secondary | ICD-10-CM | POA: Diagnosis present

## 2013-09-22 DIAGNOSIS — R471 Dysarthria and anarthria: Secondary | ICD-10-CM | POA: Diagnosis present

## 2013-09-22 DIAGNOSIS — I252 Old myocardial infarction: Secondary | ICD-10-CM

## 2013-09-22 DIAGNOSIS — Z8673 Personal history of transient ischemic attack (TIA), and cerebral infarction without residual deficits: Secondary | ICD-10-CM

## 2013-09-22 DIAGNOSIS — R29810 Facial weakness: Secondary | ICD-10-CM | POA: Diagnosis present

## 2013-09-22 DIAGNOSIS — E46 Unspecified protein-calorie malnutrition: Secondary | ICD-10-CM | POA: Diagnosis present

## 2013-09-22 DIAGNOSIS — G609 Hereditary and idiopathic neuropathy, unspecified: Secondary | ICD-10-CM | POA: Diagnosis present

## 2013-09-22 DIAGNOSIS — E785 Hyperlipidemia, unspecified: Secondary | ICD-10-CM | POA: Diagnosis present

## 2013-09-22 DIAGNOSIS — K219 Gastro-esophageal reflux disease without esophagitis: Secondary | ICD-10-CM | POA: Diagnosis present

## 2013-09-22 DIAGNOSIS — Z951 Presence of aortocoronary bypass graft: Secondary | ICD-10-CM

## 2013-09-22 DIAGNOSIS — G459 Transient cerebral ischemic attack, unspecified: Principal | ICD-10-CM | POA: Diagnosis present

## 2013-09-22 DIAGNOSIS — R259 Unspecified abnormal involuntary movements: Secondary | ICD-10-CM

## 2013-09-22 DIAGNOSIS — Z7982 Long term (current) use of aspirin: Secondary | ICD-10-CM

## 2013-09-22 DIAGNOSIS — I6509 Occlusion and stenosis of unspecified vertebral artery: Secondary | ICD-10-CM | POA: Diagnosis present

## 2013-09-22 DIAGNOSIS — Z79899 Other long term (current) drug therapy: Secondary | ICD-10-CM

## 2013-09-22 DIAGNOSIS — N390 Urinary tract infection, site not specified: Secondary | ICD-10-CM | POA: Diagnosis present

## 2013-09-22 DIAGNOSIS — J4489 Other specified chronic obstructive pulmonary disease: Secondary | ICD-10-CM | POA: Diagnosis present

## 2013-09-22 DIAGNOSIS — J449 Chronic obstructive pulmonary disease, unspecified: Secondary | ICD-10-CM | POA: Diagnosis present

## 2013-09-22 DIAGNOSIS — Z7902 Long term (current) use of antithrombotics/antiplatelets: Secondary | ICD-10-CM

## 2013-09-22 DIAGNOSIS — Z87891 Personal history of nicotine dependence: Secondary | ICD-10-CM

## 2013-09-22 LAB — CBC
HEMATOCRIT: 43.1 % (ref 39.0–52.0)
HEMOGLOBIN: 14.2 g/dL (ref 13.0–17.0)
MCH: 33.8 pg (ref 26.0–34.0)
MCHC: 32.9 g/dL (ref 30.0–36.0)
MCV: 102.6 fL — AB (ref 78.0–100.0)
Platelets: 193 10*3/uL (ref 150–400)
RBC: 4.2 MIL/uL — ABNORMAL LOW (ref 4.22–5.81)
RDW: 14.6 % (ref 11.5–15.5)
WBC: 8.5 10*3/uL (ref 4.0–10.5)

## 2013-09-22 LAB — PROTIME-INR
INR: 1.15 (ref 0.00–1.49)
Prothrombin Time: 14.5 seconds (ref 11.6–15.2)

## 2013-09-22 LAB — I-STAT TROPONIN, ED: Troponin i, poc: 0 ng/mL (ref 0.00–0.08)

## 2013-09-22 LAB — URINE MICROSCOPIC-ADD ON

## 2013-09-22 LAB — COMPREHENSIVE METABOLIC PANEL
ALK PHOS: 99 U/L (ref 39–117)
ALT: 15 U/L (ref 0–53)
AST: 16 U/L (ref 0–37)
Albumin: 3 g/dL — ABNORMAL LOW (ref 3.5–5.2)
BUN: 13 mg/dL (ref 6–23)
CO2: 25 mEq/L (ref 19–32)
Calcium: 9.2 mg/dL (ref 8.4–10.5)
Chloride: 104 mEq/L (ref 96–112)
Creatinine, Ser: 0.68 mg/dL (ref 0.50–1.35)
GFR calc non Af Amer: >90 mL/min (ref >90)
GLUCOSE: 114 mg/dL — AB (ref 70–99)
Potassium: 4.8 mEq/L (ref 3.7–5.3)
Sodium: 140 mEq/L (ref 137–147)
TOTAL PROTEIN: 6.9 g/dL (ref 6.0–8.3)
Total Bilirubin: 0.4 mg/dL (ref 0.3–1.2)

## 2013-09-22 LAB — RAPID URINE DRUG SCREEN, HOSP PERFORMED
AMPHETAMINES: NOT DETECTED
Barbiturates: NOT DETECTED
Benzodiazepines: NOT DETECTED
COCAINE: NOT DETECTED
Opiates: POSITIVE — AB
TETRAHYDROCANNABINOL: NOT DETECTED

## 2013-09-22 LAB — DIFFERENTIAL
Basophils Absolute: 0 10*3/uL (ref 0.0–0.1)
Basophils Relative: 0 % (ref 0–1)
Eosinophils Absolute: 0 10*3/uL (ref 0.0–0.7)
Eosinophils Relative: 0 % (ref 0–5)
LYMPHS ABS: 1 10*3/uL (ref 0.7–4.0)
LYMPHS PCT: 12 % (ref 12–46)
Monocytes Absolute: 0.4 10*3/uL (ref 0.1–1.0)
Monocytes Relative: 4 % (ref 3–12)
NEUTROS ABS: 7.1 10*3/uL (ref 1.7–7.7)
Neutrophils Relative %: 84 % — ABNORMAL HIGH (ref 43–77)

## 2013-09-22 LAB — URINALYSIS, ROUTINE W REFLEX MICROSCOPIC
Glucose, UA: NEGATIVE mg/dL
Hgb urine dipstick: NEGATIVE
Ketones, ur: NEGATIVE mg/dL
NITRITE: POSITIVE — AB
PROTEIN: NEGATIVE mg/dL
SPECIFIC GRAVITY, URINE: 1.023 (ref 1.005–1.030)
UROBILINOGEN UA: 1 mg/dL (ref 0.0–1.0)
pH: 7 (ref 5.0–8.0)

## 2013-09-22 LAB — APTT: APTT: 37 s (ref 24–37)

## 2013-09-22 LAB — ETHANOL: Alcohol, Ethyl (B): <11 mg/dL (ref 0–11)

## 2013-09-22 MED ORDER — IOHEXOL 350 MG/ML SOLN
50.0000 mL | Freq: Once | INTRAVENOUS | Status: AC | PRN
  Administered 2013-09-22: 50 mL via INTRAVENOUS

## 2013-09-22 MED ORDER — ASPIRIN EC 81 MG PO TBEC
81.0000 mg | DELAYED_RELEASE_TABLET | Freq: Every day | ORAL | Status: DC
  Administered 2013-09-22: 81 mg via ORAL
  Filled 2013-09-22: qty 1

## 2013-09-22 MED ORDER — ISOSORBIDE MONONITRATE ER 60 MG PO TB24
120.0000 mg | ORAL_TABLET | Freq: Every day | ORAL | Status: DC
  Administered 2013-09-22 – 2013-09-23 (×2): 120 mg via ORAL
  Filled 2013-09-22 (×2): qty 2

## 2013-09-22 MED ORDER — DEXTROSE 5 % IV SOLN
1.0000 g | Freq: Once | INTRAVENOUS | Status: AC
  Administered 2013-09-22: 1 g via INTRAVENOUS
  Filled 2013-09-22: qty 10

## 2013-09-22 MED ORDER — HEPARIN SODIUM (PORCINE) 5000 UNIT/ML IJ SOLN
5000.0000 [IU] | Freq: Three times a day (TID) | INTRAMUSCULAR | Status: DC
  Administered 2013-09-22 – 2013-09-23 (×4): 5000 [IU] via SUBCUTANEOUS
  Filled 2013-09-22 (×6): qty 1

## 2013-09-22 MED ORDER — RANOLAZINE ER 500 MG PO TB12
1000.0000 mg | ORAL_TABLET | Freq: Two times a day (BID) | ORAL | Status: DC
  Administered 2013-09-22 – 2013-09-23 (×3): 1000 mg via ORAL
  Filled 2013-09-22 (×4): qty 2

## 2013-09-22 MED ORDER — CARVEDILOL 3.125 MG PO TABS
3.1250 mg | ORAL_TABLET | Freq: Two times a day (BID) | ORAL | Status: DC
  Administered 2013-09-22 – 2013-09-23 (×3): 3.125 mg via ORAL
  Filled 2013-09-22 (×4): qty 1

## 2013-09-22 MED ORDER — ENSURE PUDDING PO PUDG
1.0000 | Freq: Two times a day (BID) | ORAL | Status: DC
  Administered 2013-09-22 – 2013-09-23 (×2): 1 via ORAL

## 2013-09-22 MED ORDER — SODIUM CHLORIDE 0.9 % IJ SOLN
3.0000 mL | Freq: Two times a day (BID) | INTRAMUSCULAR | Status: DC
  Administered 2013-09-22 – 2013-09-23 (×3): 3 mL via INTRAVENOUS

## 2013-09-22 MED ORDER — CLOPIDOGREL BISULFATE 75 MG PO TABS
75.0000 mg | ORAL_TABLET | Freq: Every day | ORAL | Status: DC
  Administered 2013-09-23: 75 mg via ORAL
  Filled 2013-09-22 (×2): qty 1

## 2013-09-22 MED ORDER — HYDROCODONE-ACETAMINOPHEN 10-325 MG PO TABS
2.0000 | ORAL_TABLET | Freq: Two times a day (BID) | ORAL | Status: DC | PRN
  Administered 2013-09-23: 1 via ORAL
  Filled 2013-09-22: qty 1

## 2013-09-22 MED ORDER — MORPHINE SULFATE ER 15 MG PO TBCR
30.0000 mg | EXTENDED_RELEASE_TABLET | Freq: Two times a day (BID) | ORAL | Status: DC
  Administered 2013-09-22 – 2013-09-23 (×2): 30 mg via ORAL
  Filled 2013-09-22 (×2): qty 2

## 2013-09-22 MED ORDER — ATORVASTATIN CALCIUM 80 MG PO TABS
80.0000 mg | ORAL_TABLET | Freq: Every day | ORAL | Status: DC
  Administered 2013-09-22 – 2013-09-23 (×2): 80 mg via ORAL
  Filled 2013-09-22 (×3): qty 1

## 2013-09-22 MED ORDER — DEXTROSE 5 % IV SOLN
1.0000 g | INTRAVENOUS | Status: DC
  Administered 2013-09-23: 1 g via INTRAVENOUS
  Filled 2013-09-22: qty 10

## 2013-09-22 NOTE — Progress Notes (Signed)
Family Medicine Teaching Service Daily Progress Note Intern Pager: [phone]  Patient name: Chris Payne Medical record number: [id number] Date of birth: Mar 12, 1951 Age: 63 y.o. Gender: male  Primary Care Provider: Ellsworth Lennox, MD Consultants: Neuro, Vasc surg Code Status: Full  Pt Overview and Major Events to Date:   Assessment and Plan: BRISON FIUMARA is a 63 y.o. male presenting with transient right leg weakness . PMH is significant for CAD, HTN, PAD, AAArepair, carotid stenosis, CVA   #Neuro:Transient dysarthria, and facial droop: Resolved. CT neg. 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  Admitted a month ago for TIA (focal weakness) and clonus bilaterally w/ significant hyperreflexia, concerning for UMN lesion. Evaluated with Neurology inpatient and outpatient f/u with Dr. Krista Blue (5/12) who is concerned for cervical spondylitic myelopathy, in combination of lumbar spinal stenosis, peripheral neuropathy, Dr Krista Blue ordered MRI of cervical spine, MRI of lumbar spine, EMG nerve conduction study (scheduled for 10/04/13) -CK, ESR, CRP to further delinate msk weakness  -Crestor changed to Lipitor as inpatient and continue ASA.  -Recent risk stratification labs done.  -Courtesy call to Dr. Krista Blue- change ASA to plavix, does not plan to see pt in house at this time  -vasc consulted (as below)  -consider MMSE later today  # CV: CAD with history of CVA and MI/ Hypertension, AAA repair  -Continue home ASA, Crestor, Imdur and Ranexa. Amlodipine and Coreg (chaged from metoprolol in his last admission due to low HR)  -Telemetry monitoring.  -vasc consulted recommend medical management vs CEA or CAS (pt to f/up with Dr. Trula Slade in the office)   #GU: UTI: UA suggestive of infection. Has dysuria and frequency for about 1 month. No fevers, no CVA tenderness. Wast treated at his prior admission with IV cetriaxone and questionable if course of keflex was completed.  -Ceftriaxone 1g  q24 while inpatient  -Urine culture collected will await results and treat accordingly.   # MSK: Chronic back pain  -Continue MS contin $RemoveB'30mg'ERlyltBV$  BID  -Continue Norco 10/325 PRN   # FENGI: protein caloric malnutrition: low albumin, will consult with Nutrition for evaluation.  - Heart healthy, saline lock   Prophylaxis: Heparin subq   Disposition: further w/up of msk weakness; results of urine cultures  Subjective: awake and alert; feels much improved; had a hard time remebering previous deficits that prompted admission but feels better apart from chronic back pain  Objective: Temp:  [97.5 F (36.4 C)-98 F (36.7 C)] 97.8 F (36.6 C) (05/29 2129) Pulse Rate:  [48-82] 82 (05/29 2129) Resp:  [13-22] 18 (05/29 2129) BP: (104-171)/(57-87) 107/65 mmHg (05/29 2129) SpO2:  [92 %-100 %] 100 % (05/29 2129) Physical Exam: General:NAD, lying quietly in bed, attempting to urinate HEENT: PERRL, EMOI, moist mucous membranes  Cardiovascular: Regular rate rhythm, no murmurs or gallops. No JVD 2+ radial pulses and 1+ pedal pulses  Respiratory: Coarse breath sounds bilaterally. No rales or wheezes  Abdomen: Soft, mid line scar present, non-tender, non-distended  Extremities: Cool extremities, slight erythema at tips of toes, unchanged.  Skin: multiple seborrheic lesions throughout. No skin breakdown.  Neuro:Alert and Oriented X3, CNII-XII intact, strength 5/5 in bilateral upper extremities, right hip flexion 3/5, left hip flexion 4/5, bilateral dorsi/planter flexion 4+/5, 3+ bilateral brachioradialis/biceps/patellar reflexes, sustained clonus in both ankles (worse on left side than right), no axterisis  Laboratory:  Recent Labs Lab 09/22/13 0805  WBC 8.5  HGB 14.2  HCT 43.1  PLT 193    Recent Labs Lab 09/22/13 0805  NA 140  K 4.8  CL 104  CO2 25  BUN 13  CREATININE 0.68  CALCIUM 9.2  PROT 6.9  BILITOT 0.4  ALKPHOS 99  ALT 15  AST 16  GLUCOSE 114*     Imaging/Diagnostic  Tests: CT head 09/22/13  IMPRESSION:  There are several prior small infarcts, stable. There is patchy  periventricular small vessel disease. There is no acute appearing  infarct. There is no hemorrhage or mass effect.  CTA neck IMPRESSION:  1. Chronic soft plaque at both ICA origins, greater on the left with  some chronic ulceration. Left ICA bulb stenosis now up to 55 % with  respect to the distal vessel. Right ICA bulb stenosis remains less  than 50%.  2. Chronic severe left vertebral artery atherosclerosis, with only  trickle flow present in that vessel throughout much of the neck.  Suspect short segment full occlusion of the left vertebral just  below the skullbase, with poorly reconstituted flow from an upper  cervical muscular branch at C1. The appearance is stable since 2012.  The left vertebral appears occluded intracranially, congruent with  the recent MRA.  3. Right vertebral artery remains patent without stenosis   Langston Masker, MD 09/22/2013, 9:54 PM PGY-1, [place] Intern pager: [phone], text pages welcome

## 2013-09-22 NOTE — Telephone Encounter (Signed)
[facility] faxed orders for pt. I have sent the to Dr Leward Quan at 104 through [place].

## 2013-09-22 NOTE — ED Notes (Signed)
Pt from home via [facility] with c/o slurred speech, slow to respond, and weakness this am.  Pt has hx of stroke with left sided deficits, but is normally ambulatory with a walker and has normal speech.  Family reported pt had a "flemmy" cough all night.  EMS reports ronchi throughout and diminished in the bases.  Pt c/o his chronic leg pain and feeling sick on his stomach.  Pt in NAD.

## 2013-09-22 NOTE — Progress Notes (Signed)
INITIAL NUTRITION ASSESSMENT  DOCUMENTATION CODES Per approved criteria  -Not Applicable   INTERVENTION: Recommend daily weights Provide Snack once daily Provide Ensure Pudding BID  NUTRITION DIAGNOSIS: Increased nutrient needs related to muscle wasting and unintentional weight loss as evidenced by physical exam and history of 8% weight loss.   Goal: Pt to meet >/= 90% of their estimated nutrition needs   Monitor:  PO intake, weight trend, labs  Reason for Assessment: Consult for assessment of nutrition status  63 y.o. male  Admitting Dx: <principal problem not specified>  ASSESSMENT: 64 y.o. male presenting with transient right leg weakness . PMH is significant for CAD, HTN, PAD, AAA, carotid stenosis, CVA.  Pt states that his appetite has been good and he has been eating well. He reports that he usually weighs 170 lbs and denies any recent weight loss. Weight history shows pt's weight dropped to 156 lbs the end of April (8% weight loss), which pt states he was unaware of. Per physical exam, pt has some mild to moderate muscle wasting.  Encouraged PO intake and general healthful diet. Pt agreeable to receiving snacks and Ensure Pudding.  Labs reviewed.   Nutrition Focused Physical Exam:  Subcutaneous Fat:  Orbital Region: wnl Upper Arm Region: mild wasting Thoracic and Lumbar Region: NA  Muscle:  Temple Region: mild wasting Clavicle Bone Region: mild wasting Clavicle and Acromion Bone Region: mild wasting Scapular Bone Region: NA Dorsal Hand: mild wasting Patellar Region: moderate wasting Anterior Thigh Region: mild/moderate wasting Posterior Calf Region: wnl  Edema: +1 RLE edema, +2 LLE edema   Height: Ht Readings from Last 1 Encounters:  09/05/13 5\' 7"  (1.702 m)    Weight: Wt Readings from Last 1 Encounters:  09/05/13 170 lb (77.111 kg)    Ideal Body Weight: 148 lbs  % Ideal Body Weight: 115% (based on weight from 08/1213)  Wt Readings from Last 10  Encounters:  09/05/13 170 lb (77.111 kg)  08/24/13 165 lb 1.6 oz (74.889 kg)  08/23/13 156 lb 8 oz (70.988 kg)  07/21/13 166 lb (75.297 kg)  07/20/13 173 lb 1.9 oz (78.527 kg)  07/10/13 171 lb 11.8 oz (77.9 kg)  07/05/13 171 lb 11.8 oz (77.9 kg)  07/05/13 171 lb 11.8 oz (77.9 kg)  06/01/13 169 lb (76.658 kg)  10/31/12 175 lb (79.379 kg)    Usual Body Weight: 170 lbs  % Usual Body Weight: NA- no recent weight  BMI:  There is no weight on file to calculate BMI.  Estimated Nutritional Needs: Kcal: 1800-2000 Protein: 80-90 grams Fluid: 1.8-2 L/day  Skin: +1 RLE edema, +2 LLE edema; intact  Diet Order: Cardiac  EDUCATION NEEDS: -No education needs identified at this time   Intake/Output Summary (Last 24 hours) at 09/22/13 1521 Last data filed at 09/22/13 0918  Gross per 24 hour  Intake      0 ml  Output    150 ml  Net   -150 ml    Last BM: PTA   Labs:   Recent Labs Lab 09/22/13 0805  NA 140  K 4.8  CL 104  CO2 25  BUN 13  CREATININE 0.68  CALCIUM 9.2  GLUCOSE 114*    CBG (last 3)  No results found for this basename: GLUCAP,  in the last 72 hours  Scheduled Meds: . atorvastatin  80 mg Oral q1800  . carvedilol  3.125 mg Oral BID WC  . [START ON 09/23/2013] cefTRIAXone (ROCEPHIN) IVPB 1 gram/50 mL D5W  1  g Intravenous Q24H  . [START ON 09/23/2013] clopidogrel  75 mg Oral Q breakfast  . heparin  5,000 Units Subcutaneous 3 times per day  . isosorbide mononitrate  120 mg Oral Daily  . morphine  30 mg Oral Q12H  . ranolazine  1,000 mg Oral BID  . sodium chloride  3 mL Intravenous Q12H    Continuous Infusions:   Past Medical History  Diagnosis Date  . Hypertension   . Stroke     h/o pontine CVA  . AAA (abdominal aortic aneurysm)     s/p repair 6/11  . Hyperlipidemia   . CAD (coronary artery disease)     s/p CABG 2/12:  LIMA to LAD, SVG to diagonal-1, SVG to ramus intermedius,, SVG to AM (Dr. Roxan Hockey) ;  b.  Myoview 10/13:  inf infarct with very  mild peri-infarct ischemia, EF 49%, inf HK; c. Cath 2/25 showed 2/4 occluded grafts & high grade stenosis in the LAD distal to the LIMA insertion, not amenable to PCI - Med Rx  . Carotid stenosis     a.  dopplers 1/61: LICA 09-60%;  b. Carotid dopplers 3/14:  R 0-39%, L 60-79%, repeat 6 mos  . PAD (peripheral artery disease)     ABIs 4/12:  R 0.88, L 0.92; R SFA 40%, L CFA 50%, L SFA 50-60%  . COPD (chronic obstructive pulmonary disease)   . Inguinal hernia   . Thyroid nodule     incidental finding on carotid doppler 3/14 => thyroid U/S ordered  . Myocardial infarction   . Anginal pain     occ last 1-2 months ago  . Shortness of breath   . Pneumonia     hx  . GERD (gastroesophageal reflux disease)     Past Surgical History  Procedure Laterality Date  . Hip surgery  40 years ago    left hip bone removal and pinning  . Abdominal aortic aneurysm repair  10-03-2009  . Exploratory laparotomy  09/2009    ligation of lumbar arteries  . Coronary artery bypass graft      LIMA to the LAD, SVG to first diagonal, SVG to ramus intermediate, SVG to acute marginal. EF 50%. 2/12  . External ear surgery Left     laceration child  . Inguinal hernia repair Left 07/21/2012    Procedure: HERNIA REPAIR INGUINAL ADULT;  Surgeon: Merrie Roof, MD;  Location: [facility];  Service: General;  Laterality: Left;  . Insertion of mesh Left 07/21/2012    Procedure: INSERTION OF MESH;  Surgeon: Merrie Roof, MD;  Location: [facility];  Service: General;  Laterality: Left;  . Exploration post operative open heart      Pryor Ochoa RD, LDN Inpatient Clinical Dietitian Pager: [phone] After Hours Pager: [phone]

## 2013-09-22 NOTE — Consult Note (Addendum)
CONSULT NOTE   MRN : [id number]  Reason for Consult: Left carotid stenosis  Referring Physician: Lupita Dawn, MD   History of Present Illness: 63 y/o male with history of CVA after open AAA repair (Dr. Trula Slade) presents with chief complaint: left leg weakness mainly in the lower leg.  Patient states he woke up with bilateral arm weakness.  He recently was admitted with right leg weakness last month.  Neuro work-up at that time was not consistent with CVA.  The patient denies any current neurologic sx.  Reportedly the patient had dysarthria and right leg weakness prior to presenting in the ED.   Most recent B carotid duplex (07/19/13): R 1-39%, 60-79%.  The patient's risk factor for atherosclerosis include: HTN, HLD, and history of smoking.    Current Facility-Administered Medications  Medication Dose Route Frequency Provider Last Rate Last Dose  . atorvastatin (LIPITOR) tablet 80 mg  80 mg Oral q1800 Dayarmys Piloto de Gwendalyn Ege, MD      . carvedilol (COREG) tablet 3.125 mg  3.125 mg Oral BID WC Tawanna Sat, MD      . Derrill Memo ON 09/23/2013] cefTRIAXone (ROCEPHIN) 1 g in dextrose 5 % 50 mL IVPB  1 g Intravenous Q24H Dayarmys Piloto de Gwendalyn Ege, MD      . Derrill Memo ON 09/23/2013] clopidogrel (PLAVIX) tablet 75 mg  75 mg Oral Q breakfast Bryan R Hess, DO      . heparin injection 5,000 Units  5,000 Units Subcutaneous 3 times per day Dayarmys Piloto de Gwendalyn Ege, MD   5,000 Units at 09/22/13 1447  . HYDROcodone-acetaminophen (NORCO) 10-325 MG per tablet 2 tablet  2 tablet Oral Q12H PRN Dayarmys Piloto de Gwendalyn Ege, MD      . isosorbide mononitrate (IMDUR) 24 hr tablet 120 mg  120 mg Oral Daily Dayarmys Piloto de Gwendalyn Ege, MD   120 mg at 09/22/13 1445  . morphine (MS CONTIN) 12 hr tablet 30 mg  30 mg Oral Q12H Dayarmys Piloto de Gwendalyn Ege, MD      . ranolazine (RANEXA) 12 hr tablet 1,000 mg  1,000 mg Oral BID Dayarmys Piloto de Gwendalyn Ege, MD   1,000 mg at 09/22/13 1446  . sodium chloride 0.9 % injection 3 mL  3 mL  Intravenous Q12H Dayarmys Piloto de Gwendalyn Ege, MD   3 mL at 09/22/13 1446   Pt meds include: Statin :Yes Betablocker: Yes ASA: No Other anticoagulants/antiplatelets: Plavix  Past Medical History  Diagnosis Date  . Hypertension   . Stroke     h/o pontine CVA  . AAA (abdominal aortic aneurysm)     s/p repair 6/11  . Hyperlipidemia   . CAD (coronary artery disease)     s/p CABG 2/12:  LIMA to LAD, SVG to diagonal-1, SVG to ramus intermedius,, SVG to AM (Dr. Roxan Hockey) ;  b.  Myoview 10/13:  inf infarct with very mild peri-infarct ischemia, EF 49%, inf HK; c. Cath 2/25 showed 2/4 occluded grafts & high grade stenosis in the LAD distal to the LIMA insertion, not amenable to PCI - Med Rx  . Carotid stenosis     a.  dopplers 8/67: LICA 67-20%;  b. Carotid dopplers 3/14:  R 0-39%, L 60-79%, repeat 6 mos  . PAD (peripheral artery disease)     ABIs 4/12:  R 0.88, L 0.92; R SFA 40%, L CFA 50%, L SFA 50-60%  . COPD (chronic obstructive pulmonary disease)   . Inguinal hernia   .  Thyroid nodule     incidental finding on carotid doppler 3/14 => thyroid U/S ordered  . Myocardial infarction   . Anginal pain     occ last 1-2 months ago  . Shortness of breath   . Pneumonia     hx  . GERD (gastroesophageal reflux disease)    Past Surgical History  Procedure Laterality Date  . Hip surgery  40 years ago    left hip bone removal and pinning  . Abdominal aortic aneurysm repair  10-03-2009  . Exploratory laparotomy  09/2009    ligation of lumbar arteries  . Coronary artery bypass graft      LIMA to the LAD, SVG to first diagonal, SVG to ramus intermediate, SVG to acute marginal. EF 50%. 2/12  . External ear surgery Left     laceration child  . Inguinal hernia repair Left 07/21/2012    Procedure: HERNIA REPAIR INGUINAL ADULT;  Surgeon: Merrie Roof, MD;  Location: [facility];  Service: General;  Laterality: Left;  . Insertion of mesh Left 07/21/2012    Procedure: INSERTION OF MESH;  Surgeon: Merrie Roof, MD;  Location: [facility];  Service: General;  Laterality: Left;  . Exploration post operative open heart      Social History History  Substance Use Topics  . Smoking status: Former Smoker -- 1.00 packs/day for 30 years    Types: Cigarettes    Quit date: 01/25/2010  . Smokeless tobacco: Never Used  . Alcohol Use: No     Comment: former drinker - Quit 2011.   Family History Family History  Problem Relation Age of Onset  . Alcohol abuse Father   . Cancer Sister     No Known Allergies   REVIEW OF SYSTEMS  General: [ ]  Weight loss, [ ]  Fever, [ ]  chills Neurologic: [x ] Dizziness, [ ]  Blackouts, [ ]  Seizure, [ x] Stroke, [ ]  "Mini stroke", [ ]  Slurred speech, [ ]  Temporary blindness; [ ]  weakness in arms or legs, [ ]  Hoarseness [ ]  Dysphagia Cardiac: [ ]  Chest pain/pressure, [ ]  Shortness of breath at rest [ ]  Shortness of breath with exertion, [ ]  Atrial fibrillation or irregular heartbeat  Vascular: [ ]  Pain in legs with walking, [ ]  Pain in legs at rest, [ ]  Pain in legs at night,  [ ]  Non-healing ulcer, [ ]  Blood clot in vein/DVT,   Pulmonary: [ ]  Home oxygen, [ ]  Productive cough, [ ]  Coughing up blood, [ ]  Asthma,  [ ]  Wheezing [ ]  COPD Musculoskeletal:  [ ]  Arthritis, [ ]  Low back pain, [ ]  Joint pain  weakness Hematologic: [ ]  Easy Bruising, [ ]  Anemia; [ ]  Hepatitis Gastrointestinal: [ ]  Blood in stool, [ ]  Gastroesophageal Reflux/heartburn, Urinary: [ ]  chronic Kidney disease, [ ]  on HD - [ ]  MWF or [ ]  TTHS, [ ]  Burning with urination, [ ]  Difficulty urinating Skin: [ ]  Rashes, [ ]  Wounds Psychological: [ ]  Anxiety, [ ]  Depression  Physical Examination Filed Vitals:   09/22/13 1200 09/22/13 1215 09/22/13 1222 09/22/13 1332  BP: 105/87 128/64 128/64 148/76  Pulse: 52 54 51 52  Temp:    97.9 F (36.6 C)  TempSrc:    Oral  Resp: 22 16 18 20   SpO2: 100% 94% 99% 100%   There is no weight on file to calculate BMI.  General:  WDWN in NAD HENT: WNL Eyes:  PERRLA, EOMI Pulmonary: normal non-labored breathing ,  without Rales, rhonchi,  wheezing Cardiac: RRR, without  Murmurs, rubs or gallops; No carotid bruits Abdomen: soft, NT, no masses Skin: no rashes, ulcers noted;  no Gangrene , no cellulitis; no open wounds;  Vascular Exam/Pulses: Palpable right DP, non palpable PT bil., Palpable radial and brachial bil.   Musculoskeletal: no muscle wasting or atrophy; no edema  Neurologic: A&O X 3; Appropriate Affect ; SENSATION: normal; MOTOR FUNCTION: 5/5 Symmetric except Left dorsiflexion and plantar flexion 4/5; Speech is fluent/normal Lymph: no palpable LAD Psych: judgment intact, mood and affect intact  Significant Diagnostic Studies: CBC Lab Results  Component Value Date   WBC 8.5 09/22/2013   HGB 14.2 09/22/2013   HCT 43.1 09/22/2013   MCV 102.6* 09/22/2013   PLT 193 09/22/2013    BMET    Component Value Date/Time   NA 140 09/22/2013 0805   K 4.8 09/22/2013 0805   CL 104 09/22/2013 0805   CO2 25 09/22/2013 0805   GLUCOSE 114* 09/22/2013 0805   BUN 13 09/22/2013 0805   CREATININE 0.68 09/22/2013 0805   CREATININE 0.86 08/23/2013 1736   CALCIUM 9.2 09/22/2013 0805   GFRNONAA >90 09/22/2013 0805   GFRAA >90 09/22/2013 0805   The CrCl is unknown because both a height and weight (above a minimum accepted value) are required for this calculation.  COAG Lab Results  Component Value Date   INR 1.15 09/22/2013   INR 1.10 08/23/2013   INR 1.04 07/10/2013   Radiology: Ct Head Wo Contrast  09/22/2013   CLINICAL DATA:  Slurred speech  EXAM: CT HEAD WITHOUT CONTRAST  TECHNIQUE: Contiguous axial images were obtained from the base of the skull through the vertex without intravenous contrast. Study was obtained within 24 hr of patient's arrival at the emergency department.  COMPARISON:  July 10, 2013  FINDINGS: The ventricles are normal in size and configuration. There is a cavum septum pellucidum, an anatomic variant. There is no appreciable mass,  hemorrhage, extra-axial fluid collection, or midline shift. There are prior infarcts in each pons region, stable. There is evidence of a prior infarct in the right putamen as well as small vessel disease in the 's inferior centra semiovale bilaterally, stable. There is also evidence of small vessel disease in the region of the right internal capsule anteriorly. There is no new gray-white compartment lesion. No acute infarct apparent.  Bony calvarium appears intact. The mastoid air cells are clear. There is patchy ethmoid sinus disease bilaterally. There is a retention cyst in the inferior right maxillary antrum. There is leftward deviation of the nasal septum.  IMPRESSION: There are several prior small infarcts, stable. There is patchy periventricular small vessel disease. There is no acute appearing infarct. There is no hemorrhage or mass effect.   Electronically Signed   By: Lowella Grip M.D.   On: 09/22/2013 08:37   Ct Angio Chest Aorta W/cm &/or Wo/cm  09/21/2013   CLINICAL DATA:  Dilatation of the aortic root.  EXAM: CT ANGIOGRAPHY CHEST WITH CONTRAST  TECHNIQUE: Multidetector CT imaging of the chest was performed using the standard protocol during bolus administration of intravenous contrast. Multiplanar CT image reconstructions and MIPs were obtained to evaluate the vascular anatomy.  CONTRAST:  168mL OMNIPAQUE IOHEXOL 350 MG/ML SOLN  COMPARISON:  09/13/2012 and 06/21/2010.  FINDINGS: There is stable dilatation of the ascending thoracic aorta. The maximal diameter at the level of the sinuses of Valsalva is 4.3 cm. The proximal ascending thoracic aorta measures 4.2 cm. The proximal arch  measures 3.8 cm. The distal arch measures 3.3 cm. The descending thoracic aorta measures 2.7 cm. The patient is status post CABG. There is no evidence of aortic dissection.  The heart size is stable and mildly enlarged. No pleural or pericardial fluid is seen. Lung windows show stable chronic disease with scattered  areas of parenchymal scarring and atelectasis present bilaterally. No pulmonary edema, focal airspace consolidation, pneumothorax or nodule is identified. There is no evidence of lymphadenopathy. The bony thorax is unremarkable.  Review of the MIP images confirms the above findings.  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   Electronically Signed   By: Aletta Edouard M.D.   On: 09/21/2013 14:29    ASSESSMENT/PLAN:   Bilateral UE weakness: possible TIA, unclear etiology  Residual LLE weakness: present since prior CVA   Recent RLE weakness: work-up in progress per Neurology, including MRI spine and EMG  Left internal carotid stenosis 60-79%: CTA Neck to evaluate for exact degree of stenosis and possible ulcerated plaque  Right internal carotid artery stenosis 1-39%: medical mgmt  Ulyses Amor 09/22/2013 3:01 PM  Addendum  I have independently interviewed and examined the patient, and I agree with the physician assistant's findings.  Pt's sx pattern not consistent even between interviewers.  Given prior MRI demonstrating prior BILATERAL CVA, I doubt carotid arteries are the source of this patient's sx.  I would obtain the CTA Neck that I had previously recommended to fully evaluate the L ICA stenosis, especially given the PSV have not increased that remarkably.  I'm not convinced this patient needs L CEA.    - will continue to follow up with you - would probably get Neuro's input into this patient.    Adele Barthel, MD [facility] Office: [phone] Pager: [phone]  09/22/2013, 4:14 PM  Addendum  1. Chronic soft plaque at both ICA origins, greater on the left with some chronic ulceration. Left ICA bulb stenosis now up to 55 % with respect to the distal vessel. Right ICA bulb stenosis remains less than 50%.  2. Chronic severe left vertebral artery atherosclerosis, with only trickle flow present in that vessel throughout  much of the neck.  Suspect short segment full occlusion of the left vertebral just below the skullbase, with poorly reconstituted flow from an upper  cervical muscular branch at C1. The appearance is stable since 2012. The left vertebral appears occluded intracranially, congruent with  the recent MRA.  3. Right vertebral artery remains patent without stenosis.  - Again I'm exactly certain what this patient's sx were during his TIA as my twos PA and myself each got different answers when interviewing him. - The CTA demonstrates limited disease BILATERAL: R <50%, L 55%.  Both well below the 70% threshold, many vascular surgeon set for sx stenoses.   - In this patient with unclear sx, short of MRI evidence of new acute CVA, I do not recommend CEA or CAS rather medical mgmt. - Pt can follow up with Dr. Trula Slade in the office for longitudinal surveillance.   Adele Barthel, MD [facility] Office: [phone] Pager: [phone]  09/22/2013, 6:57 PM

## 2013-09-22 NOTE — H&P (Signed)
[facility] Admission History and Physical Service Pager: [phone]  Patient name: NEZIAH BRALEY Medical record number: [id number] Date of birth: Aug 12, 1950 Age: 64 y.o. Gender: male  Primary Care Provider: Ellsworth Lennox, MD Consultants: none Code Status: full  Chief Complaint: dysarthria   Assessment and Plan: TORRANCE STOCKLEY is a 63 y.o. male presenting with transient right leg weakness . PMH is significant for CAD, HTN, PAD, AAArepair, carotid stenosis, CVA   1.  Neuro:Transient dysarthria, and facial droop: Now resolved. Symptoms concerning for TIA with CT negative. Admitted a month ago for TIA (focal weakness) and clonus bilaterally w/ significant hyperreflexia, concerning for UMN lesion. Evaluated with Neurology inpatient and outpatient f/u with Dr. Krista Blue (5/12) who is concerned for cervical spondylitic myelopathy, in combination of lumbar spinal stenosis, peripheral neuropathy, Dr Krista Blue ordered MRI of cervical spine, MRI of lumbar spine, EMG nerve conduction study   MRI cervical 5/23: Abnormal MRI scan of cervical spine showing mild spondylytic changes from C3-C6 but without significant compression MRI Lumbar 5/23 : Abnormal MRI scan the lumbar spine and prominent spondylitic changes throughout most severe at L5 -S1 for the severe right and moderate left-sided foraminal narrowing and at L4-5 there is moderate left-sided foraminal narrowing. Overall no significant change compared with MRI scan 08/24/2013. EMG: is scheduled for 10/04/13  Patient with significant vascular disease. Had previous carotid dopplers showing 01-60% occulusion of LICA in March 1093.   No vascular f/u has been done since his discharge. 08/26/13  -Admit to telemetry. -Neuro checks every 2h today then can be spaced out if neurologic stability.   -Crestor changed to Lipitor as inpatient and continue ASA.  -Recent risk stratification labs done. -Courtesy call to Dr. Krista Blue to find out if  further work up is needed now that pt is hospitalized.  -Consult Vascular as inpatient.   # CV: CAD with history of CVA and MI/ Hypertension  -Continue home ASA, Crestor, Imdur and Ranexa. Amlodipine and Coreg (chaged from metoprolol in his last admission due to low HR) -Telemetry monitoring. -LICA currently within range for intervention with CEA. Vascular surgery consulted last hospitalization and they wanted to f/u as outpatient. No f/u appears to be scheduled.  -Re-consult to vascular as above with this new TIA.    Uro: UTI: UA suggestive of infection. Has dysuria and frequency for about 1 month. No fevers, no CVA tenderness. Wast treated at his prior admission with IV cetriaxone and questionable if course of keflex was completed. -Ceftriaxone 1g q24 while inpatient  -Urine culture collected will await results and treat accordingly.   # MSK: Chronic back pain  -Continue MS contin 30mg  BID  -Continue Norco 10/325 PRN  # protein caloric malnutrition: low albumin, will consult with Nutrition for evaluation.   FEN/GI: Heart healthy, saline lock  Prophylaxis: Heparin subq   Disposition: pending further work up.  History of Present Illness: TAARIQ LEITZ is a 63 y.o. male presenting with dysarthria and apparent facial droop at 6:00 am today that resolved prior coming to the hospital. Denies new focalized weakness or numbness. CT head was negative for CVA.   Review Of Systems: Per HPI with the following additions: dysuria and frequency. Denies flank pain, fever or nausea.  Otherwise 12 point review of systems was performed and was unremarkable.  Patient Active Problem List   Diagnosis Date Noted  . Gait difficulty 09/05/2013  . Low back pain 09/05/2013  . TIA (transient ischemic attack) 08/23/2013  . HCAP (healthcare-associated pneumonia) 07/10/2013  .  Other and unspecified angina pectoris 07/04/2013  . Bilateral leg pain 10/31/2012  . Nontoxic multinodular goiter 10/07/2012  .  Unstable angina 06/19/2012  . Carotid stenosis   . PAD (peripheral artery disease)   . Left inguinal hernia 05/30/2012  . COPD (chronic obstructive pulmonary disease) 08/24/2011  . S/P AAA repair 08/24/2011  . Lumbar disc disease   . Hypertensive heart disease   . Hyperlipidemia   . CAD (coronary artery disease)   . History of pontine CVA   . GERD (gastroesophageal reflux disease)    Past Medical History: Past Medical History  Diagnosis Date  . Hypertension   . Stroke     h/o pontine CVA  . AAA (abdominal aortic aneurysm)     s/p repair 6/11  . Hyperlipidemia   . CAD (coronary artery disease)     s/p CABG 2/12:  LIMA to LAD, SVG to diagonal-1, SVG to ramus intermedius,, SVG to AM (Dr. Roxan Hockey) ;  b.  Myoview 10/13:  inf infarct with very mild peri-infarct ischemia, EF 49%, inf HK; c. Cath 2/25 showed 2/4 occluded grafts & high grade stenosis in the LAD distal to the LIMA insertion, not amenable to PCI - Med Rx  . Carotid stenosis     a.  dopplers 4/09: LICA 81-19%;  b. Carotid dopplers 3/14:  R 0-39%, L 60-79%, repeat 6 mos  . PAD (peripheral artery disease)     ABIs 4/12:  R 0.88, L 0.92; R SFA 40%, L CFA 50%, L SFA 50-60%  . COPD (chronic obstructive pulmonary disease)   . Inguinal hernia   . Thyroid nodule     incidental finding on carotid doppler 3/14 => thyroid U/S ordered  . Myocardial infarction   . Anginal pain     occ last 1-2 months ago  . Shortness of breath   . Pneumonia     hx  . GERD (gastroesophageal reflux disease)    Past Surgical History: Past Surgical History  Procedure Laterality Date  . Hip surgery  40 years ago    left hip bone removal and pinning  . Abdominal aortic aneurysm repair  10-03-2009  . Exploratory laparotomy  09/2009    ligation of lumbar arteries  . Coronary artery bypass graft      LIMA to the LAD, SVG to first diagonal, SVG to ramus intermediate, SVG to acute marginal. EF 50%. 2/12  . External ear surgery Left     laceration  child  . Inguinal hernia repair Left 07/21/2012    Procedure: HERNIA REPAIR INGUINAL ADULT;  Surgeon: Merrie Roof, MD;  Location: [facility];  Service: General;  Laterality: Left;  . Insertion of mesh Left 07/21/2012    Procedure: INSERTION OF MESH;  Surgeon: Merrie Roof, MD;  Location: [facility];  Service: General;  Laterality: Left;  . Exploration post operative open heart     Social History: History  Substance Use Topics  . Smoking status: Former Smoker -- 1.00 packs/day for 30 years    Types: Cigarettes    Quit date: 01/25/2010  . Smokeless tobacco: Never Used  . Alcohol Use: No     Comment: former drinker - Quit 2011.   Additional social history: Please also refer to relevant sections of EMR.  Family History: Family History  Problem Relation Age of Onset  . Alcohol abuse Father   . Cancer Sister    Allergies and Medications: No Known Allergies No current facility-administered medications on file prior to  encounter.   Current Outpatient Prescriptions on File Prior to Encounter  Medication Sig Dispense Refill  . aspirin EC 81 MG tablet Take 81 mg by mouth daily.      . carvedilol (COREG) 3.125 MG tablet Take 1 tablet (3.125 mg total) by mouth daily with breakfast.  30 tablet  1  . HYDROcodone-acetaminophen (NORCO) 10-325 MG per tablet Take 2 tablets by mouth every 12 (twelve) hours as needed for moderate pain.       . isosorbide mononitrate (IMDUR) 120 MG 24 hr tablet Take 1 tablet (120 mg total) by mouth daily.  30 tablet  5  . morphine (MS CONTIN) 30 MG 12 hr tablet Take 30 mg by mouth every 12 (twelve) hours.       . nitroGLYCERIN (NITROSTAT) 0.4 MG SL tablet Place 0.4 mg under the tongue every 5 (five) minutes as needed for chest pain.       . ranolazine (RANEXA) 1000 MG SR tablet Take 1 tablet (1,000 mg total) by mouth 2 (two) times daily.  60 tablet  5  . rosuvastatin (CRESTOR) 40 MG tablet Take 1 tablet (40 mg total) by mouth daily.  30 tablet  1  . cephALEXin (KEFLEX)  500 MG capsule Take 1 capsule (500 mg total) by mouth 3 (three) times daily. For 4 more days  12 capsule  0    Objective: BP 121/61  Pulse 48  Temp(Src) 97.9 F (36.6 C) (Rectal)  Resp 20  SpO2 100% Exam: General:NAD HEENT: PERRL, EMOI, moist mucous membranes  Cardiovascular: Regular rate rhythm, no murmurs or gallops. No JVD  2+ radial pulses and 1+ pedal pulses   Respiratory: Coarse breath sounds bilaterally. No rales or wheezes Abdomen: Soft, mid line scar present, non-tender, non-distended  Extremities: Cool extremities, slight erythema at tips of toes, unchanged.  Skin: multiple seborrheic lesions throughout. No skin breakdown.  Neuro:Alert and Oriented X3, CNII-XII intact, strength 5/5 in bilateral upper extremities, right hip flexion 3/5, left hip flexion 4/5, bilateral dorsi/planter flexion 4+/5, 3+ bilateral brachioradialis/biceps/patellar reflexes, sustained clonus in both ankles, no axterisis, rhomberg/gait not tested due to patient safety.   Labs and Imaging: CBC BMET   Recent Labs Lab 09/22/13 0805  WBC 8.5  HGB 14.2  HCT 43.1  PLT 193    Recent Labs Lab 09/22/13 0805  NA 140  K 4.8  CL 104  CO2 25  BUN 13  CREATININE 0.68  GLUCOSE 114*  CALCIUM 9.2     CT head 09/22/13 IMPRESSION:  There are several prior small infarcts, stable. There is patchy  periventricular small vessel disease. There is no acute appearing  infarct. There is no hemorrhage or mass effect.  Dayarmys Piloto de Gwendalyn Ege, MD 09/22/2013, 10:39 AM  PGY-3 , [facility] pager: [phone], text pages welcome

## 2013-09-22 NOTE — H&P (Signed)
Family Medicine Teaching Service Attending Note  I interviewed and examined patient Chris Payne and reviewed their tests and x-rays.  I discussed with Dr. Thomes Dinning and reviewed their note for today.  I agree with their assessment and plan.     Additionally  Feels he is back to his baseline after this AM's weakness  Oriented to self and but doesn't remember name of hospital.  Reports he has been here 2 days and spent the night here last PM (was admitted 3-4 hours ago) Reports June 2015 and knows president.    Thenar muscle wasting Very slow getting out of bed Limited deep knee bend Can't raise legs to get back in bed after sitting   Agree with consult neurology.  Looks like he has NCS ordered for future. Would check CK and ESR CRP looking for primary muscle disease or other inflammatory condition or perhaps cancer related weakness (recent chest CT no sign on lung cancer) No recent abdomen CT.  Would check MMSE tomorrow seems to have some deficits vs depressive symptoms

## 2013-09-22 NOTE — ED Provider Notes (Signed)
CSN: [id number]     Arrival date & time 09/22/13  0709 History   First MD Initiated Contact with Patient 09/22/13 [phone]     Chief Complaint  Patient presents with  . Weakness  . Cough  . Aphasia     (Consider location/radiation/quality/duration/timing/severity/associated sxs/prior Treatment) HPI 63 year old male was last known well at bedtime last night at approximately 8 PM, at baseline has left hemiparesis from prior strokes, and has had multiple prior strokes and imaging, at baseline his chronic back pain radiating down right leg with some baseline weakness and numbness to right leg, woke up this morning and had a transient spell which is now resolved of dysarthria, global aphasia, and apparent right facial droop, all of which are now resolved, and there was no apparent new weakness or numbness to his extremities prior to arrival, he is no headache no chest pain no cough no shortness breath no abdominal pain no vomiting no other concerns and feels back to baseline now, and at baseline uses a walker. Past Medical History  Diagnosis Date  . Hypertension   . Stroke     h/o pontine CVA  . AAA (abdominal aortic aneurysm)     s/p repair 6/11  . Hyperlipidemia   . CAD (coronary artery disease)     s/p CABG 2/12:  LIMA to LAD, SVG to diagonal-1, SVG to ramus intermedius,, SVG to AM (Dr. Roxan Hockey) ;  b.  Myoview 10/13:  inf infarct with very mild peri-infarct ischemia, EF 49%, inf HK; c. Cath 2/25 showed 2/4 occluded grafts & high grade stenosis in the LAD distal to the LIMA insertion, not amenable to PCI - Med Rx  . Carotid stenosis     a.  dopplers 6/94: LICA 85-46%;  b. Carotid dopplers 3/14:  R 0-39%, L 60-79%, repeat 6 mos  . PAD (peripheral artery disease)     ABIs 4/12:  R 0.88, L 0.92; R SFA 40%, L CFA 50%, L SFA 50-60%  . COPD (chronic obstructive pulmonary disease)   . Inguinal hernia   . Thyroid nodule     incidental finding on carotid doppler 3/14 => thyroid U/S ordered  .  Myocardial infarction   . Anginal pain     occ last 1-2 months ago  . Shortness of breath   . Pneumonia     hx  . GERD (gastroesophageal reflux disease)    Past Surgical History  Procedure Laterality Date  . Hip surgery  40 years ago    left hip bone removal and pinning  . Abdominal aortic aneurysm repair  10-03-2009  . Exploratory laparotomy  09/2009    ligation of lumbar arteries  . Coronary artery bypass graft      LIMA to the LAD, SVG to first diagonal, SVG to ramus intermediate, SVG to acute marginal. EF 50%. 2/12  . External ear surgery Left     laceration child  . Inguinal hernia repair Left 07/21/2012    Procedure: HERNIA REPAIR INGUINAL ADULT;  Surgeon: Merrie Roof, MD;  Location: [place];  Service: General;  Laterality: Left;  . Insertion of mesh Left 07/21/2012    Procedure: INSERTION OF MESH;  Surgeon: Merrie Roof, MD;  Location: [place];  Service: General;  Laterality: Left;  . Exploration post operative open heart     Family History  Problem Relation Age of Onset  . Alcohol abuse Father   . Cancer Sister    History  Substance Use Topics  .  Smoking status: Former Smoker -- 1.00 packs/day for 30 years    Types: Cigarettes    Quit date: 01/25/2010  . Smokeless tobacco: Never Used  . Alcohol Use: No     Comment: former drinker - Quit 2011.    Review of Systems  10 Systems reviewed and are negative for acute change except as noted in the HPI.  Allergies  Review of patient's allergies indicates no known allergies.  Home Medications   Prior to Admission medications   Medication Sig Start Date End Date Taking? Authorizing Provider  aspirin EC 81 MG tablet Take 81 mg by mouth daily.   Yes Historical Provider, MD  atorvastatin (LIPITOR) 80 MG tablet Take 80 mg by mouth daily.   Yes Historical Provider, MD  carvedilol (COREG) 3.125 MG tablet Take 1 tablet (3.125 mg total) by mouth daily with breakfast. 08/26/13  Yes Leeanne Rio, MD   HYDROcodone-acetaminophen [facility]) 10-325 MG per tablet Take 2 tablets by mouth every 12 (twelve) hours as needed for moderate pain.    Yes Historical Provider, MD  isosorbide mononitrate (IMDUR) 120 MG 24 hr tablet Take 1 tablet (120 mg total) by mouth daily. 07/05/13  Yes Tarri Fuller, PA-C  morphine (MS CONTIN) 30 MG 12 hr tablet Take 30 mg by mouth every 12 (twelve) hours.  07/07/13  Yes Historical Provider, MD  nitroGLYCERIN (NITROSTAT) 0.4 MG SL tablet Place 0.4 mg under the tongue every 5 (five) minutes as needed for chest pain.  06/01/13  Yes Minus Breeding, MD  ranolazine (RANEXA) 1000 MG SR tablet Take 1 tablet (1,000 mg total) by mouth 2 (two) times daily. 07/05/13  Yes Tarri Fuller, PA-C  rosuvastatin (CRESTOR) 40 MG tablet Take 1 tablet (40 mg total) by mouth daily. 08/26/13  Yes Leeanne Rio, MD  tamsulosin (FLOMAX) 0.4 MG CAPS capsule Take 0.4 mg by mouth daily.   Yes Historical Provider, MD  ciprofloxacin (CIPRO) 250 MG tablet Take 1 tablet (250 mg total) by mouth 2 (two) times daily. 09/23/13   Tawanna Sat, MD  clopidogrel (PLAVIX) 75 MG tablet Take 1 tablet (75 mg total) by mouth daily with breakfast. 09/23/13   Langston Masker, MD   BP 110/75  Pulse 70  Temp(Src) 98.1 F (36.7 C) (Oral)  Resp 20  SpO2 95% Physical Exam  Nursing note and vitals reviewed. Constitutional:  Awake, alert, nontoxic appearance with baseline speech for patient.  HENT:  Head: Atraumatic.  Mouth/Throat: No oropharyngeal exudate.  Eyes: EOM are normal. Pupils are equal, round, and reactive to light. Right eye exhibits no discharge. Left eye exhibits no discharge.  Neck: Neck supple.  Cardiovascular: Normal rate and regular rhythm.   No murmur heard. Pulmonary/Chest: Effort normal and breath sounds normal. No stridor. No respiratory distress. He has no wheezes. He has no rales. He exhibits no tenderness.  Abdominal: Soft. Bowel sounds are normal. He exhibits no mass. There is no tenderness. There is no  rebound.  Musculoskeletal: He exhibits no tenderness.  Baseline ROM, moves extremities with no obvious new focal weakness.  Lymphadenopathy:    He has no cervical adenopathy.  Neurological: He is alert.  Awake, alert, cooperative and aware of situation; motor strength baseline left hemiparesis with 5 out of 5 strength in his right arm and 4/5 right leg and 4/5 strength in his left arm and leg which patient reports his baseline; sensation normal to light touch bilaterally which includes slight decreased baseline light touch to both feet and lower legs; peripheral  visual fields full to confrontation; no facial asymmetry; tongue midline; major cranial nerves appear intact; no pronator drift, normal finger to nose bilaterally  Skin: No rash noted.  Psychiatric: He has a normal mood and affect.    ED Course  Procedures (including critical care time) Patient / Family / Caregiver understand and agree with initial ED impression and plan with expectations set for ED visit.d/w Admit team. Labs Review Labs Reviewed  CBC - Abnormal; Notable for the following:    RBC 4.20 (*)    MCV 102.6 (*)    All other components within normal limits  DIFFERENTIAL - Abnormal; Notable for the following:    Neutrophils Relative % 84 (*)    All other components within normal limits  COMPREHENSIVE METABOLIC PANEL - Abnormal; Notable for the following:    Glucose, Bld 114 (*)    Albumin 3.0 (*)    All other components within normal limits  URINE RAPID DRUG SCREEN (HOSP PERFORMED) - Abnormal; Notable for the following:    Opiates POSITIVE (*)    All other components within normal limits  URINALYSIS, ROUTINE W REFLEX MICROSCOPIC - Abnormal; Notable for the following:    APPearance HAZY (*)    Bilirubin Urine SMALL (*)    Nitrite POSITIVE (*)    Leukocytes, UA MODERATE (*)    All other components within normal limits  URINE MICROSCOPIC-ADD ON - Abnormal; Notable for the following:    Bacteria, UA FEW (*)    All  other components within normal limits  SEDIMENTATION RATE - Abnormal; Notable for the following:    Sed Rate 18 (*)    All other components within normal limits  C-REACTIVE PROTEIN - Abnormal; Notable for the following:    CRP 0.6 (*)    All other components within normal limits  URINE CULTURE  ETHANOL  PROTIME-INR  APTT  CK  I-STAT TROPOININ, ED    Imaging Review Ct Head Wo Contrast  09/22/2013   CLINICAL DATA:  Slurred speech  EXAM: CT HEAD WITHOUT CONTRAST  TECHNIQUE: Contiguous axial images were obtained from the base of the skull through the vertex without intravenous contrast. Study was obtained within 24 hr of patient's arrival at the emergency department.  COMPARISON:  July 10, 2013  FINDINGS: The ventricles are normal in size and configuration. There is a cavum septum pellucidum, an anatomic variant. There is no appreciable mass, hemorrhage, extra-axial fluid collection, or midline shift. There are prior infarcts in each pons region, stable. There is evidence of a prior infarct in the right putamen as well as small vessel disease in the 's inferior centra semiovale bilaterally, stable. There is also evidence of small vessel disease in the region of the right internal capsule anteriorly. There is no new gray-white compartment lesion. No acute infarct apparent.  Bony calvarium appears intact. The mastoid air cells are clear. There is patchy ethmoid sinus disease bilaterally. There is a retention cyst in the inferior right maxillary antrum. There is leftward deviation of the nasal septum.  IMPRESSION: There are several prior small infarcts, stable. There is patchy periventricular small vessel disease. There is no acute appearing infarct. There is no hemorrhage or mass effect.   Electronically Signed   By: Lowella Grip M.D.   On: 09/22/2013 08:37   Ct Angio Neck W/cm &/or Wo/cm  09/22/2013   CLINICAL DATA:  63 year old male with hypertension. Stroke. Left carotid plaque. Initial  encounter.  EXAM: CT ANGIOGRAPHY NECK  TECHNIQUE: Multidetector CT imaging of  the neck was performed using the standard protocol during bolus administration of intravenous contrast. Multiplanar CT image reconstructions and MIPs were obtained to evaluate the vascular anatomy. Carotid stenosis measurements (when applicable) are obtained utilizing NASCET criteria, using the distal internal carotid diameter as the denominator.  CONTRAST:  18mL OMNIPAQUE IOHEXOL 350 MG/ML SOLN  COMPARISON:  Cervical spine MRI 08/24/2013. Brain MRI and MRA 08/23/2013. CTA neck 06/21/2010.  FINDINGS: Stable lung apices with mild paraseptal emphysema. No superior mediastinal lymphadenopathy. Stable visualized osseous structures. Mild paranasal sinus mucosal thickening. Mastoids are clear. No cervical lymphadenopathy. Negative thyroid, larynx, pharynx, parapharyngeal spaces, retropharyngeal space, sublingual space, submandibular glands, and parotid glands. Visualized orbit soft tissues are within normal limits. Visualized brain parenchyma appears stable to that on the recent MRI.  VASCULAR FINDINGS:  Chronic arch soft plaque, mostly affecting the left subclavian artery origin. Stable great vessel origins since 2012, no hemodynamically significant stenosis.  Normal right CCA origin. Right carotid bifurcation with moderate soft and minimal calcified plaque re- identified. Mostly the right ICA origin and bulb are affected. Subsequent stenosis remains less than 50 % with respect to the distal vessel (series 401, image 74). Otherwise negative cervical right ICA. Visible right ICA siphon is patent with calcified plaque. Visible right ICA terminus is patent.  Stable mild plaque involving the proximal right subclavian artery with no proximal right subclavian stenosis. Right vertebral artery origin Stable and within normal limits. Tortuous proximal right vertebral artery is stable. The right vertebral artery remains dominant and normal throughout the  neck. Intracranial right vertebral artery remains patent along with the right PICA origin.  Stable left CCA origin with no stenosis. Chronic mostly soft plaque at the left carotid bifurcation mostly affecting the left ICA origin and bulb. Somewhat chronically ulcerated appearance of this plaque (series 401, image 70). Subsequent left ICA stenosis now is up to 55 % with respect to the distal vessel. Beyond this level, the cervical left ICA is normal. The visible left ICA siphon is patent with calcified plaque.  Stable soft plaque in the left subclavian artery resulting in less than 50% stenosis. The left vertebral origin is nearly occluded, with tandem high-grade stenoses in the left V1 segment resulting in only thread-like enhancement of the vessel throughout the V1 and V2 segment to the C3 level. Briefly at the C2-C3 level of the left vertebral artery enhancement improved (series 404, image 157), but the vessel appears to occlude at the left C1-C2 segment (series 401, image 96 and series 404, image 170). Just beyond the point of occlusion there is some reconstituted flow from and upper cervical muscular branch (series 404, image 169), however, there is still minimal if any enhancement of the intracranial left vertebral artery (No antegrade flow signal detected on recent MRA). These findings have not significantly changed since 2012.  Review of the MIP images confirms the above findings.  IMPRESSION: 1. Chronic soft plaque at both ICA origins, greater on the left with some chronic ulceration. Left ICA bulb stenosis now up to 55 % with respect to the distal vessel. Right ICA bulb stenosis remains less than 50%. 2. Chronic severe left vertebral artery atherosclerosis, with only trickle flow present in that vessel throughout much of the neck. Suspect short segment full occlusion of the left vertebral just below the skullbase, with poorly reconstituted flow from an upper cervical muscular branch at C1. The appearance is  stable since 2012. The left vertebral appears occluded intracranially, congruent with the recent MRA. 3. Right vertebral  artery remains patent without stenosis.   Electronically Signed   By: Lars Pinks M.D.   On: 09/22/2013 17:56     EKG Interpretation   Date/Time:  Friday Sep 22 2013 07:19:04 EDT Ventricular Rate:  56 PR Interval:  162 QRS Duration: 110 QT Interval:  502 QTC Calculation: 484 R Axis:   9 Text Interpretation:  Sinus arrhythmia Borderline prolonged QT interval No  significant change since last tracing Confirmed by [facility]  MD, Jenny Reichmann  [phone]) on 09/22/2013 7:38:54 AM      MDM   Final diagnoses:  TIA (transient ischemic attack)    The patient appears reasonably stabilized for admission considering the current resources, flow, and capabilities available in the ED at this time, and I doubt any other [facility] requiring further screening and/or treatment in the ED prior to admission.    Babette Relic, MD 09/24/13 [phone]

## 2013-09-23 LAB — SEDIMENTATION RATE: SED RATE: 18 mm/h — AB (ref 0–16)

## 2013-09-23 LAB — CK: Total CK: 26 U/L (ref 7–232)

## 2013-09-23 LAB — C-REACTIVE PROTEIN: CRP: 0.6 mg/dL — AB (ref <0.60)

## 2013-09-23 MED ORDER — CIPROFLOXACIN HCL 250 MG PO TABS: 250.0000 mg | ORAL_TABLET | Freq: Two times a day (BID) | ORAL | Status: DC

## 2013-09-23 MED ORDER — CLOPIDOGREL BISULFATE 75 MG PO TABS: 75.0000 mg | ORAL_TABLET | Freq: Every day | ORAL | Status: DC

## 2013-09-23 NOTE — Progress Notes (Signed)
PVR checked on patient with 273ml result noted. Patient void amber color urine of 264ml. Will continue to monitor. Aisha RN

## 2013-09-23 NOTE — Progress Notes (Signed)
Ready for discharge home into the care of his family; reviewed discharge instructions with  Patient and family.

## 2013-09-23 NOTE — Discharge Instructions (Signed)
You were admitted for concern of a stroke or temporary stroke called Transient Ischemic Attack. The imaging studies show you did not have a stroke, but do have some vessels in your neck that have narrowings; we asked the vascular surgeons to evaluate you and they did not recommend surgery at this time.  We also called your neurologist Dr. Krista Blue who was recommending you be switched to Plavix, an antiplatelet medication similar to aspirin, and that you follow up with him in the next week.  It appears you may have a urinary tract infection like in the beginning of the month, the culture is still pending but we will discharge you with 7 days of antibiotics. If these need to be changed based on the culture someone from the hospital will call you.  Antibiotic: Ciprofloxacin 250mg  take twice a day

## 2013-09-23 NOTE — Discharge Summary (Signed)
[facility] Discharge Summary  Patient name: Chris Payne Medical record number: [id number] Date of birth: Jun 30, 1950 Age: 63 y.o. Gender: male Date of Admission: 09/22/2013  Date of Discharge: 09/23/13 Admitting Physician: Lind Covert, MD  Primary Care Provider: Ellsworth Lennox, MD Consultants: Charolotte Eke, Neuro [facility])  Indication for Hospitalization: transient weakness and facial droop  Discharge Diagnoses/Problem List:  -TIA -CAD  -HX of CVA -Hx of MI -HTN -Prior AAA repair -UTI -Chronic back pain -Protein Calorie malnutrition   Disposition: home   Discharge Condition: improved  Discharge Exam: BP 110/75  Pulse 70  Temp(Src) 98.1 F (36.7 C) (Oral)  Resp 20  SpO2 95% General:NAD, lying quietly in bed, attempting to urinate  HEENT: PERRL, EMOI, moist mucous membranes  Cardiovascular: Regular rate rhythm, no murmurs or gallops. No JVD 2+ radial pulses and 1+ pedal pulses  Respiratory: Coarse breath sounds bilaterally. No rales or wheezes  Abdomen: Soft, mid line scar present, non-tender, non-distended  Extremities: Cool extremities, slight erythema at tips of toes, unchanged.  Skin: multiple seborrheic lesions throughout. No skin breakdown.  Neuro:Alert and Oriented X3, CNII-XII intact, strength 5/5 in bilateral upper extremities, right hip flexion 3/5, left hip flexion 4/5, bilateral dorsi/planter flexion 4+/5, 3+ bilateral brachioradialis/biceps/patellar reflexes, sustained clonus in both ankles (worse on left side than right), no axterisis  Brief Hospital Course:  Chris Payne is a 63 y.o. male presenting with transient right leg weakness . PMH is significant for CAD, HTN, PAD, AAArepair, carotid stenosis, CVA   #Neuro:Transient dysarthria, and facial droop: Pt initially presented with dysarthria and facial droop around 6am on day of admission that resolved prior to presentation. Denied new weakness/numbness. CT head neg for  acute process. Admitted a month ago for TIA (focal weakness) and clonus bilaterally w/ significant hyperreflexia, concerning for UMN lesion. Evaluated with Neurology inpatient and outpatient f/u with Dr. Krista Blue (5/12) who is concerned for cervical spondylitic myelopathy, in combination of lumbar spinal stenosis, peripheral neuropathy, Dr Krista Blue ordered MRI of cervical spine, MRI of lumbar spine, EMG nerve conduction study  MRI cervical 5/23: Abnormal MRI scan of cervical spine showing mild spondylytic changes from C3-C6 but without significant compression. MRI Lumbar 5/23 : Abnormal MRI scan the lumbar spine and prominent spondylitic changes throughout most severe at L5 -S1 for the severe right and moderate left-sided foraminal narrowing and at L4-5 there is moderate left-sided foraminal narrowing. Overall no significant change compared with MRI scan 08/24/2013.EMG: is scheduled for 10/04/13. Dr. Krista Blue called and recommended switching to plavix Continued on medical management. Vasc surg consulted given previous carotids showing some occlusion. CTA neck obtained with near complete occlusion of vertebral artery but bilaterally <50% occlusion of carotids. Given below threshold for intervention recommended f/up in outpt office for further survelliance. CK 26) , ESR (0.6) , CRP (18) obtained prior to d/c given clonus and mm weakness. No indication for PT/OT this admission as pt at baseline and saw during last admission.   # CV: CAD with history of CVA and MI/ Hypertension, AAA repair Continued on home ASA, Crestor, Imdur and Ranexa. Amlodipine and Coreg (chaged from metoprolol in his last admission due to low HR). Placed on tele given above presentation. No events noted. Vasc consulted recommended medical management vs CEA or CAS (pt to f/up with Dr. Trula Slade in the office)   #GU: UTI: UA suggestive of infection. Has had dysuria and frequency for about 1 month. No fevers, no CVA tenderness. Was treated at his prior admission  with IV cetriaxone and questionable if course of keflex was completed. Placed on CTX while in pt. Sent home to complete course of cipro (given questionable prior tx failure?). PRV checked prior to d/c. Approx 233cc noted. Pt has hx of retaining in past. (questionably related to some neuro-demyelinating process) Would rec close outpt uro follow up.   # MSK: Chronic back pain Continued on MS contin $RemoveB'30mg'PtEgOMIi$  BID and Norco 10/325 PRN. No flares   # FENGI: protein caloric malnutrition: Pt with low albumin. Nutrition consulted rec'd snacks and ensure pudding.  Issues for Follow Up:  1. Improvement of TIA sx on plavix vs ASA 2. Resolution of dysuria and complete of abx (pending urine culture appropriate tailoring of regimen) 3. Completion of EMG testing per Dr. Krista Blue   Significant Procedures: None  Significant Labs and Imaging:   Recent Labs Lab 09/22/13 0805  WBC 8.5  HGB 14.2  HCT 43.1  PLT 193    Recent Labs Lab 09/22/13 0805  NA 140  K 4.8  CL 104  CO2 25  GLUCOSE 114*  BUN 13  CREATININE 0.68  CALCIUM 9.2  ALKPHOS 99  AST 16  ALT 15  ALBUMIN 3.0*   CT head 09/22/13  IMPRESSION:  There are several prior small infarcts, stable. There is patchy  periventricular small vessel disease. There is no acute appearing  infarct. There is no hemorrhage or mass effect.  CTA neck  IMPRESSION:  1. Chronic soft plaque at both ICA origins, greater on the left with  some chronic ulceration. Left ICA bulb stenosis now up to 55 % with  respect to the distal vessel. Right ICA bulb stenosis remains less  than 50%.  2. Chronic severe left vertebral artery atherosclerosis, with only  trickle flow present in that vessel throughout much of the neck.  Suspect short segment full occlusion of the left vertebral just  below the skullbase, with poorly reconstituted flow from an upper  cervical muscular branch at C1. The appearance is stable since 2012.  The left vertebral appears occluded  intracranially, congruent with  the recent MRA.  3. Right vertebral artery remains patent without stenosis   Results/Tests Pending at Time of Discharge: Urine Culture, ESR, CRP  Discharge Medications:    Medication List    STOP taking these medications       cephALEXin 500 MG capsule  Commonly known as:  KEFLEX     metoprolol tartrate 25 MG tablet  Commonly known as:  LOPRESSOR      TAKE these medications       aspirin EC 81 MG tablet  Take 81 mg by mouth daily.     atorvastatin 80 MG tablet  Commonly known as:  LIPITOR  Take 80 mg by mouth daily.     carvedilol 3.125 MG tablet  Commonly known as:  COREG  Take 1 tablet (3.125 mg total) by mouth daily with breakfast.     ciprofloxacin 250 MG tablet  Commonly known as:  CIPRO  Take 1 tablet (250 mg total) by mouth 2 (two) times daily.     clopidogrel 75 MG tablet  Commonly known as:  PLAVIX  Take 1 tablet (75 mg total) by mouth daily with breakfast.     HYDROcodone-acetaminophen 10-325 MG per tablet  Commonly known as:  NORCO  Take 2 tablets by mouth every 12 (twelve) hours as needed for moderate pain.     isosorbide mononitrate 120 MG 24 hr tablet  Commonly known as:  IMDUR  Take 1 tablet (120 mg total) by mouth daily.     morphine 30 MG 12 hr tablet  Commonly known as:  MS CONTIN  Take 30 mg by mouth every 12 (twelve) hours.     nitroGLYCERIN 0.4 MG SL tablet  Commonly known as:  NITROSTAT  Place 0.4 mg under the tongue every 5 (five) minutes as needed for chest pain.     ranolazine 1000 MG SR tablet  Commonly known as:  RANEXA  Take 1 tablet (1,000 mg total) by mouth 2 (two) times daily.     rosuvastatin 40 MG tablet  Commonly known as:  CRESTOR  Take 1 tablet (40 mg total) by mouth daily.     tamsulosin 0.4 MG Caps capsule  Commonly known as:  FLOMAX  Take 0.4 mg by mouth daily.        Discharge Instructions: Please refer to Patient Instructions section of EMR for full details.  Patient was  counseled important signs and symptoms that should prompt return to medical care, changes in medications, dietary instructions, activity restrictions, and follow up appointments.   Follow-Up Appointments:     Follow-up Information   Follow up with [facility], MD. Schedule an appointment as soon as possible for a visit in 1 week. (For hospital follow up)    Specialty:  Family Medicine   Contact information:   [place] [address] [phone]       Follow up with Marcial Pacas, MD. Schedule an appointment as soon as possible for a visit in 1 week. (For hospital follow up)    Specialty:  Neurology   Contact information:   [place] [address] [phone]       Langston Masker, MD 09/23/2013, 10:42 PM PGY-1, [place]

## 2013-09-23 NOTE — Progress Notes (Signed)
Family Medicine Teaching Service Attending Note  I interviewed and examined patient Chris Payne and reviewed their tests and x-rays.  I discussed with Dr. Skeet Simmer and reviewed their note for today.  I agree with their assessment and plan.     Additionally  Feels back to baseline Alert oriented Would check PVR to make sure not significant retention Ok to follow up with neurology for continued work up of weakness and clonus No further cva prevention treatments

## 2013-09-25 LAB — URINE CULTURE

## 2013-09-25 NOTE — Telephone Encounter (Signed)
[facility] form completed and placed at Amy's station at 104.

## 2013-09-28 ENCOUNTER — Ambulatory Visit (INDEPENDENT_AMBULATORY_CARE_PROVIDER_SITE_OTHER): Payer: Commercial Managed Care - HMO | Admitting: Family Medicine

## 2013-09-28 ENCOUNTER — Encounter: Payer: Self-pay | Admitting: Family Medicine

## 2013-09-28 VITALS — BP 88/59 | HR 66 | Temp 97.8°F | Resp 18 | Ht 67.0 in | Wt 159.6 lb

## 2013-09-28 DIAGNOSIS — M519 Unspecified thoracic, thoracolumbar and lumbosacral intervertebral disc disorder: Secondary | ICD-10-CM

## 2013-09-28 DIAGNOSIS — I959 Hypotension, unspecified: Secondary | ICD-10-CM

## 2013-09-28 DIAGNOSIS — T887XXA Unspecified adverse effect of drug or medicament, initial encounter: Secondary | ICD-10-CM

## 2013-09-28 DIAGNOSIS — G894 Chronic pain syndrome: Secondary | ICD-10-CM

## 2013-09-28 NOTE — Progress Notes (Signed)
S: Review of recent hospitalizations from medical records:   This 63 y.o. Cauc male who has significant CAD, PAD, AAA and hx of pontine CVA. He has carotid stenosis w/ 29-79% occlusion of LICA in March 8921. He was hospitalized 08/23/13 w/ transient R leg weakness, recent fall and worsening lower ext tremors as well as bladder hesitancy. Known to have chronic DDD of C- spine and low back pain/ L-spine DDD. MRI of L-spine in April 2015 shows degenerative disc bulge at L5-S1 w/ severe R and moderate L foraminal stenosis. He has been evaluated by Dr. Krista Blue, neurology, who has concerns for cervical spondylitic myelopathy in combination w/ lumbar spinal stenosis and peripheral neuropathy. He recommended medication change to Plavix and ongoing medical management.   He was hospitalized again on 09/22/13 w/ transient weakness and facial droop. TIA symptoms improved on Plavix; ASA continued.  Imaging shows chronic soft plaques of ICAs, greater on left w/ some chronic ulceration. Left ICA  Bulb stenosis now up to 55% and right ICA bulb stenosis remains < 50%. There is also evidence of chronic severe left vertebral artery atherosclerosis w/ only trickle flow; left vertebral artery appears occluded intracranially.   Pt had repeat MRI of spine during this inpatient evaluation; it showed no change. EMG is scheduled for 10/04/13. He has ongoing lower extremity weakness and ambulates w/ a walker.  Pt presents today for follow-up; he is taking antibiotic for UTI w/ culture result pending at discharge. Symptoms have improved. Pt continues to have to have hesitancy. Flomax has been prescribed by urology for BPH w/ urinary retention.  Pt states compliance w/ medications and noted to have low BP today. He reports adequate hydration. Denies dizziness or lightheadedness, CP or tightness, palpitations or syncope. [facility] manages chronic cardiac issues; pt missed an w/ Dr. Percival Spanish due to recent hospitalization. Note that he  is prescribed narcotics by a pain specialist; these meds include hydrocodone- APAP 10-325 mg 2 tabs every 12 hours and morphine 30 mg 1 tab every 12 hours.  Patient Active Problem List   Diagnosis Date Noted  . Low back pain 09/05/2013  . TIA (transient ischemic attack) 08/23/2013  . Other and unspecified angina pectoris 07/04/2013  . Nontoxic multinodular goiter 10/07/2012  . Unstable angina 06/19/2012  . Carotid stenosis   . PAD (peripheral artery disease)   . Left inguinal hernia 05/30/2012  . COPD (chronic obstructive pulmonary disease) 08/24/2011  . S/P AAA repair 08/24/2011  . Lumbar disc disease   . Hypertensive heart disease   . Hyperlipidemia   . CAD (coronary artery disease)   . History of pontine CVA   . GERD (gastroesophageal reflux disease)    Prior to Admission medications   Medication Sig Start Date End Date Taking? Authorizing Provider  amLODipine (NORVASC) 2.5 MG tablet Take 2.5 mg by mouth daily.   Yes Historical Provider, MD  aspirin EC 81 MG tablet Take 81 mg by mouth daily.   Yes Historical Provider, MD  carvedilol (COREG) 3.125 MG tablet Take 1 tablet (3.125 mg total) by mouth daily with breakfast. 08/26/13  Yes Leeanne Rio, MD  ciprofloxacin (CIPRO) 250 MG tablet Take 1 tablet (250 mg total) by mouth 2 (two) times daily. 09/23/13  Yes Tawanna Sat, MD  clopidogrel (PLAVIX) 75 MG tablet Take 1 tablet (75 mg total) by mouth daily with breakfast. 09/23/13  Yes Langston Masker, MD  HYDROcodone-acetaminophen (NORCO) 10-325 MG per tablet Take 2 tablets by mouth every 12 (twelve) hours as needed  for moderate pain.    Yes Historical Provider, MD  isosorbide mononitrate (IMDUR) 120 MG 24 hr tablet Take 1 tablet (120 mg total) by mouth daily. 07/05/13  Yes Tarri Fuller, PA-C  morphine (MS CONTIN) 30 MG 12 hr tablet Take 30 mg by mouth every 12 (twelve) hours.  07/07/13  Yes Historical Provider, MD  nitroGLYCERIN (NITROSTAT) 0.4 MG SL tablet Place 0.4 mg under the tongue  every 5 (five) minutes as needed for chest pain.  06/01/13  Yes Minus Breeding, MD  ranolazine (RANEXA) 1000 MG SR tablet Take 1 tablet (1,000 mg total) by mouth 2 (two) times daily. 07/05/13  Yes Tarri Fuller, PA-C  rosuvastatin (CRESTOR) 40 MG tablet Take 1 tablet (40 mg total) by mouth daily. 08/26/13  Yes Leeanne Rio, MD  tamsulosin (FLOMAX) 0.4 MG CAPS capsule Take 0.4 mg by mouth daily.   Yes Historical Provider, MD   PMHx, Surg Hx, Soc and Fam Hx reviewed.  ROS: As per HPI.  O: Filed Vitals:   09/28/13 1318  BP: 88/59  Pulse: 66  Temp: 97.8 F (36.6 C)  Resp: 18   GEN: In NAD: WN,WD. Appears sickly and older than stated age. HENT: [place]/AT; EOMI w/ clear conj and dull sclerae. Oral mucosa is dry w/ poor dentition. COR: RRR. Lungs; Unlabored resp. MS: Spine not straight w/ paravertebral muscle spasms. Ambulates w/ walker; shuffling gait w/ L leg dragging. NEURO: A&O x 3; CNs intact. Speech is a little slowed and mild dysarthria present. PSYCH: Pleasant and calm; affect a little blunted, not labile. Cognition slowed.  A/P: Hypotension, unspecified- Contacted Dr. Rosezella [place] office to reschedule missed appt. Pt aware. Continue current medications; advised that narcotics and Tamsulosin (Flomax) may be causing hypotension.  Lumbar disc disease- Follow-up w/ Dr. Krista Blue after EMG next week.  Chronic pain syndrome- Continue current pain management but use narcotics judiciously.   Unspecified adverse effect of unspecified drug, medicinal and biological substance

## 2013-09-28 NOTE — Patient Instructions (Addendum)
We have contacted Dr. Rosezella [place] office and you have an appointment with Cecilie Kicks, NP on 10/10/2013 at 11:00 am.  You need to go to the [facility] ([address]) over [address] near [employer]  Your blood pressure is low and may be related to several medications. In addition to your blood pressure medications, Flomax (Tamsulosin) can cause this. Morphine which you take for pain can do this also. You state these pain meds are prescribed by [facility] in [place]. You need to take Hydrocodone- APAP 1 tablet every 12 hours (not 2 tablets every 12 hours) for pain.  Keep your appointments with Dr. Krista Blue and get the tests that have been scheduled for you.  You have an appointment with me in July.

## 2013-10-04 ENCOUNTER — Encounter: Payer: Self-pay | Admitting: *Deleted

## 2013-10-04 ENCOUNTER — Encounter: Payer: Commercial Managed Care - HMO | Admitting: Neurology

## 2013-10-06 ENCOUNTER — Encounter: Payer: Self-pay | Admitting: Cardiovascular Disease

## 2013-10-10 ENCOUNTER — Ambulatory Visit: Payer: Medicare HMO | Admitting: Cardiology

## 2013-10-22 ENCOUNTER — Observation Stay (HOSPITAL_COMMUNITY)
Admission: EM | Admit: 2013-10-22 | Discharge: 2013-10-23 | Disposition: A | Discharge status: Home / Self Care | Payer: Medicare HMO | Attending: Family Medicine | Admitting: Family Medicine

## 2013-10-22 ENCOUNTER — Encounter (HOSPITAL_COMMUNITY): Payer: Self-pay | Admitting: Emergency Medicine

## 2013-10-22 ENCOUNTER — Emergency Department (HOSPITAL_COMMUNITY): Payer: Medicare HMO

## 2013-10-22 DIAGNOSIS — Z8679 Personal history of other diseases of the circulatory system: Secondary | ICD-10-CM

## 2013-10-22 DIAGNOSIS — I714 Abdominal aortic aneurysm, without rupture, unspecified: Secondary | ICD-10-CM | POA: Insufficient documentation

## 2013-10-22 DIAGNOSIS — K4090 Unilateral inguinal hernia, without obstruction or gangrene, not specified as recurrent: Secondary | ICD-10-CM | POA: Insufficient documentation

## 2013-10-22 DIAGNOSIS — K219 Gastro-esophageal reflux disease without esophagitis: Secondary | ICD-10-CM | POA: Insufficient documentation

## 2013-10-22 DIAGNOSIS — Z951 Presence of aortocoronary bypass graft: Secondary | ICD-10-CM | POA: Insufficient documentation

## 2013-10-22 DIAGNOSIS — I10 Essential (primary) hypertension: Secondary | ICD-10-CM | POA: Insufficient documentation

## 2013-10-22 DIAGNOSIS — J441 Chronic obstructive pulmonary disease with (acute) exacerbation: Secondary | ICD-10-CM | POA: Insufficient documentation

## 2013-10-22 DIAGNOSIS — I2510 Atherosclerotic heart disease of native coronary artery without angina pectoris: Secondary | ICD-10-CM | POA: Insufficient documentation

## 2013-10-22 DIAGNOSIS — I208 Other forms of angina pectoris: Secondary | ICD-10-CM

## 2013-10-22 DIAGNOSIS — E041 Nontoxic single thyroid nodule: Secondary | ICD-10-CM | POA: Insufficient documentation

## 2013-10-22 DIAGNOSIS — I739 Peripheral vascular disease, unspecified: Secondary | ICD-10-CM | POA: Insufficient documentation

## 2013-10-22 DIAGNOSIS — R079 Chest pain, unspecified: Secondary | ICD-10-CM

## 2013-10-22 DIAGNOSIS — Z7902 Long term (current) use of antithrombotics/antiplatelets: Secondary | ICD-10-CM | POA: Insufficient documentation

## 2013-10-22 DIAGNOSIS — Z8701 Personal history of pneumonia (recurrent): Secondary | ICD-10-CM | POA: Insufficient documentation

## 2013-10-22 DIAGNOSIS — H9319 Tinnitus, unspecified ear: Secondary | ICD-10-CM | POA: Insufficient documentation

## 2013-10-22 DIAGNOSIS — R0789 Other chest pain: Principal | ICD-10-CM | POA: Insufficient documentation

## 2013-10-22 DIAGNOSIS — I209 Angina pectoris, unspecified: Secondary | ICD-10-CM

## 2013-10-22 DIAGNOSIS — Z87891 Personal history of nicotine dependence: Secondary | ICD-10-CM | POA: Insufficient documentation

## 2013-10-22 DIAGNOSIS — Z8673 Personal history of transient ischemic attack (TIA), and cerebral infarction without residual deficits: Secondary | ICD-10-CM | POA: Insufficient documentation

## 2013-10-22 DIAGNOSIS — I6529 Occlusion and stenosis of unspecified carotid artery: Secondary | ICD-10-CM | POA: Insufficient documentation

## 2013-10-22 DIAGNOSIS — Z7982 Long term (current) use of aspirin: Secondary | ICD-10-CM | POA: Insufficient documentation

## 2013-10-22 DIAGNOSIS — Z79899 Other long term (current) drug therapy: Secondary | ICD-10-CM | POA: Insufficient documentation

## 2013-10-22 DIAGNOSIS — E785 Hyperlipidemia, unspecified: Secondary | ICD-10-CM | POA: Insufficient documentation

## 2013-10-22 DIAGNOSIS — Z9889 Other specified postprocedural states: Secondary | ICD-10-CM

## 2013-10-22 HISTORY — DX: Headache: R51

## 2013-10-22 LAB — CBC
HEMATOCRIT: 47.9 % (ref 39.0–52.0)
HEMATOCRIT: 47 % (ref 39.0–52.0)
HEMOGLOBIN: 15.8 g/dL (ref 13.0–17.0)
HEMOGLOBIN: 15.6 g/dL (ref 13.0–17.0)
MCH: 34.8 pg — ABNORMAL HIGH (ref 26.0–34.0)
MCH: 34.8 pg — AB (ref 26.0–34.0)
MCHC: 33 g/dL (ref 30.0–36.0)
MCHC: 33.2 g/dL (ref 30.0–36.0)
MCV: 105.5 fL — AB (ref 78.0–100.0)
MCV: 104.9 fL — ABNORMAL HIGH (ref 78.0–100.0)
Platelets: 158 10*3/uL (ref 150–400)
Platelets: 159 10*3/uL (ref 150–400)
RBC: 4.54 MIL/uL (ref 4.22–5.81)
RBC: 4.48 MIL/uL (ref 4.22–5.81)
RDW: 13.8 % (ref 11.5–15.5)
RDW: 13.7 % (ref 11.5–15.5)
WBC: 7.5 10*3/uL (ref 4.0–10.5)
WBC: 5.9 10*3/uL (ref 4.0–10.5)

## 2013-10-22 LAB — URINALYSIS, ROUTINE W REFLEX MICROSCOPIC
GLUCOSE, UA: NEGATIVE mg/dL
Hgb urine dipstick: NEGATIVE
Ketones, ur: NEGATIVE mg/dL
Nitrite: NEGATIVE
Protein, ur: NEGATIVE mg/dL
Specific Gravity, Urine: 1.018 (ref 1.005–1.030)
UROBILINOGEN UA: 1 mg/dL (ref 0.0–1.0)
pH: 7 (ref 5.0–8.0)

## 2013-10-22 LAB — BASIC METABOLIC PANEL
BUN: 16 mg/dL (ref 6–23)
CHLORIDE: 102 meq/L (ref 96–112)
CO2: 29 mEq/L (ref 19–32)
CREATININE: 0.84 mg/dL (ref 0.50–1.35)
Calcium: 9.1 mg/dL (ref 8.4–10.5)
GFR calc Af Amer: >90 mL/min (ref >90)
GFR calc non Af Amer: >90 mL/min (ref >90)
Glucose, Bld: 88 mg/dL (ref 70–99)
Potassium: 4.8 mEq/L (ref 3.7–5.3)
Sodium: 142 mEq/L (ref 137–147)

## 2013-10-22 LAB — I-STAT TROPONIN, ED: Troponin i, poc: 0.01 ng/mL (ref 0.00–0.08)

## 2013-10-22 LAB — URINE MICROSCOPIC-ADD ON

## 2013-10-22 LAB — CREATININE, SERUM
Creatinine, Ser: 0.74 mg/dL (ref 0.50–1.35)
GFR calc Af Amer: >90 mL/min (ref >90)

## 2013-10-22 LAB — TROPONIN I
Troponin I: <0.3 ng/mL (ref <0.30)
Troponin I: <0.3 ng/mL (ref <0.30)
Troponin I: <0.3 ng/mL (ref <0.30)

## 2013-10-22 MED ORDER — TAMSULOSIN HCL 0.4 MG PO CAPS
0.8000 mg | ORAL_CAPSULE | Freq: Every day | ORAL | Status: DC
  Administered 2013-10-22 – 2013-10-23 (×2): 0.8 mg via ORAL
  Filled 2013-10-22 (×2): qty 2

## 2013-10-22 MED ORDER — ONDANSETRON HCL 4 MG/2ML IJ SOLN: 4.0000 mg | Freq: Four times a day (QID) | INTRAMUSCULAR | Status: DC | PRN

## 2013-10-22 MED ORDER — NITROGLYCERIN 0.4 MG SL SUBL: 0.4000 mg | SUBLINGUAL_TABLET | SUBLINGUAL | Status: DC | PRN

## 2013-10-22 MED ORDER — ATORVASTATIN CALCIUM 80 MG PO TABS
80.0000 mg | ORAL_TABLET | Freq: Every day | ORAL | Status: DC
  Administered 2013-10-22 – 2013-10-23 (×2): 80 mg via ORAL
  Filled 2013-10-22 (×2): qty 1

## 2013-10-22 MED ORDER — ALUM & MAG HYDROXIDE-SIMETH 200-200-20 MG/5ML PO SUSP
30.0000 mL | ORAL | Status: DC | PRN
  Administered 2013-10-22: 30 mL via ORAL
  Filled 2013-10-22: qty 30

## 2013-10-22 MED ORDER — MORPHINE SULFATE ER 15 MG PO TBCR
30.0000 mg | EXTENDED_RELEASE_TABLET | Freq: Two times a day (BID) | ORAL | Status: DC
  Administered 2013-10-22 – 2013-10-23 (×3): 30 mg via ORAL
  Filled 2013-10-22 (×3): qty 2

## 2013-10-22 MED ORDER — GI COCKTAIL ~~LOC~~
30.0000 mL | Freq: Three times a day (TID) | ORAL | Status: DC | PRN
  Administered 2013-10-22: 30 mL via ORAL
  Filled 2013-10-22: qty 30

## 2013-10-22 MED ORDER — ACTIVE PARTNERSHIP FOR HEALTH OF YOUR HEART BOOK
Freq: Once | Status: AC
  Administered 2013-10-23: 06:00:00
  Filled 2013-10-22: qty 1

## 2013-10-22 MED ORDER — ENOXAPARIN SODIUM 40 MG/0.4ML ~~LOC~~ SOLN
40.0000 mg | SUBCUTANEOUS | Status: DC
  Administered 2013-10-22 – 2013-10-23 (×2): 40 mg via SUBCUTANEOUS
  Filled 2013-10-22 (×2): qty 0.4

## 2013-10-22 MED ORDER — RANOLAZINE ER 500 MG PO TB12
1000.0000 mg | ORAL_TABLET | Freq: Two times a day (BID) | ORAL | Status: DC
  Administered 2013-10-22 – 2013-10-23 (×3): 1000 mg via ORAL
  Filled 2013-10-22 (×4): qty 2

## 2013-10-22 MED ORDER — ACETAMINOPHEN 325 MG PO TABS: 650.0000 mg | ORAL_TABLET | ORAL | Status: DC | PRN

## 2013-10-22 MED ORDER — CARVEDILOL 3.125 MG PO TABS
3.1250 mg | ORAL_TABLET | Freq: Every day | ORAL | Status: DC
  Administered 2013-10-22: 3.125 mg via ORAL
  Filled 2013-10-22 (×3): qty 1

## 2013-10-22 MED ORDER — ISOSORBIDE MONONITRATE ER 60 MG PO TB24
120.0000 mg | ORAL_TABLET | Freq: Every day | ORAL | Status: DC
  Administered 2013-10-22 – 2013-10-23 (×2): 120 mg via ORAL
  Filled 2013-10-22 (×2): qty 2

## 2013-10-22 MED ORDER — ASPIRIN EC 81 MG PO TBEC
81.0000 mg | DELAYED_RELEASE_TABLET | Freq: Every day | ORAL | Status: DC
  Administered 2013-10-22 – 2013-10-23 (×2): 81 mg via ORAL
  Filled 2013-10-22 (×2): qty 1

## 2013-10-22 MED ORDER — PANTOPRAZOLE SODIUM 40 MG PO TBEC
40.0000 mg | DELAYED_RELEASE_TABLET | Freq: Every day | ORAL | Status: DC
  Administered 2013-10-22 – 2013-10-23 (×2): 40 mg via ORAL
  Filled 2013-10-22 (×2): qty 1

## 2013-10-22 MED ORDER — HYDROCODONE-ACETAMINOPHEN 10-325 MG PO TABS: 2.0000 | ORAL_TABLET | Freq: Two times a day (BID) | ORAL | Status: DC | PRN

## 2013-10-22 NOTE — ED Notes (Signed)
Pt in from home via [facility], pt c/o mid non radiating CP onset today @ 7:45, pt received 324 ASA PTA & x 1 SL nitro, pt denies SOB, pt c/o productive cough with yellow mucus, pt c/o bil ringing in the ears & bil leg numbness & tingling, pt hx CVA x 4 yrs ago, pt A&O x4, follows commands, speaks in complete sentences, hx CABG & x 4 stent placement

## 2013-10-22 NOTE — ED Notes (Signed)
Pt transported out of the department at this time for testing

## 2013-10-22 NOTE — ED Provider Notes (Signed)
CSN: [id number]     Arrival date & time 10/22/13  0935 History   First MD Initiated Contact with Patient 10/22/13 [phone]     Chief Complaint  Patient presents with  . Chest Pain  . Tinnitus     (Consider location/radiation/quality/duration/timing/severity/associated sxs/prior Treatment) HPI Comments: Patient presents via EMS with episode of chest pain that is now resolved. He states he woke up around 8:00 feeling numbness and tingling in both of his hands along with ringing in his ears. Ringing in his ears is a chronic problem. He denies any headache or dizziness. After EMS arrival he had a brief episode of chest pain lasting about 20 minutes and has since resolved. This is similar to his previous angina. He denies any abdominal pain, nausea or vomiting. He was worried he is having another stroke. He denies any focal weakness at this time. He states the tingling in his hands has improved.  The history is provided by the patient and the EMS personnel. The history is limited by the condition of the patient.    Past Medical History  Diagnosis Date  . Hypertension   . Stroke     h/o pontine CVA  . AAA (abdominal aortic aneurysm)     s/p repair 6/11  . Hyperlipidemia   . CAD (coronary artery disease)     s/p CABG 2/12:  LIMA to LAD, SVG to diagonal-1, SVG to ramus intermedius,, SVG to AM (Dr. Roxan Hockey) ;  b.  Myoview 10/13:  inf infarct with very mild peri-infarct ischemia, EF 49%, inf HK; c. Cath 2/25 showed 2/4 occluded grafts & high grade stenosis in the LAD distal to the LIMA insertion, not amenable to PCI - Med Rx  . Carotid stenosis     a.  dopplers 7/59: LICA 16-38%;  b. Carotid dopplers 3/14:  R 0-39%, L 60-79%, repeat 6 mos  . PAD (peripheral artery disease)     ABIs 4/12:  R 0.88, L 0.92; R SFA 40%, L CFA 50%, L SFA 50-60%  . COPD (chronic obstructive pulmonary disease)   . Inguinal hernia   . Thyroid nodule     incidental finding on carotid doppler 3/14 => thyroid U/S ordered   . Anginal pain     occ last 1-2 months ago  . Shortness of breath   . Pneumonia     hx  . GERD (gastroesophageal reflux disease)   . Myocardial infarction     PCI x3  . [id number](701.7)    Past Surgical History  Procedure Laterality Date  . Hip surgery  40 years ago    left hip bone removal and pinning  . Abdominal aortic aneurysm repair  10-03-2009  . Exploratory laparotomy  09/2009    ligation of lumbar arteries  . Coronary artery bypass graft  06/24/2010    LIMA to the LAD, SVG to first diagonal, SVG to ramus intermediate, SVG to acute marginal. EF 50%. 2/12  . External ear surgery Left     laceration child  . Inguinal hernia repair Left 07/21/2012    Procedure: HERNIA REPAIR INGUINAL ADULT;  Surgeon: Merrie Roof, MD;  Location: [facility];  Service: General;  Laterality: Left;  . Insertion of mesh Left 07/21/2012    Procedure: INSERTION OF MESH;  Surgeon: Merrie Roof, MD;  Location: [facility];  Service: General;  Laterality: Left;  . Exploration post operative open heart    . Nm myocar perf wall motion  12/02/2010  inferoapical defect is fixed c/w prior infarction/scar, there is minimal borderzone ischemia.   Family History  Problem Relation Age of Onset  . Alcohol abuse Father   . Cancer Sister    History  Substance Use Topics  . Smoking status: Former Smoker -- 1.00 packs/day for 30 years    Types: Cigarettes    Quit date: 01/25/2010  . Smokeless tobacco: Never Used  . Alcohol Use: No     Comment: former drinker - Quit 2011.    Review of Systems  Constitutional: Negative for fever, activity change and appetite change.  HENT: Negative for congestion and rhinorrhea.   Eyes: Negative for visual disturbance.  Respiratory: Positive for chest tightness and shortness of breath. Negative for cough.   Cardiovascular: Negative for chest pain.  Gastrointestinal: Negative for nausea, vomiting and abdominal pain.  Genitourinary: Negative for dysuria and hematuria.   Musculoskeletal: Negative for arthralgias and myalgias.  Skin: Negative for rash.  Neurological: Positive for numbness. Negative for dizziness, weakness and headaches.  A complete 10 system review of systems was obtained and all systems are negative except as noted in the HPI and PMH.      Allergies  Review of patient's allergies indicates no known allergies.  Home Medications   Prior to Admission medications   Medication Sig Start Date End Date Taking? Authorizing Provider  aspirin EC 81 MG tablet Take 81 mg by mouth daily.   Yes Historical Provider, MD  atorvastatin (LIPITOR) 80 MG tablet Take 80 mg by mouth daily.   Yes Historical Provider, MD  carvedilol (COREG) 3.125 MG tablet Take 1 tablet (3.125 mg total) by mouth daily with breakfast. 08/26/13  Yes Leeanne Rio, MD  clopidogrel (PLAVIX) 75 MG tablet Take 1 tablet (75 mg total) by mouth daily with breakfast. 09/23/13  Yes Langston Masker, MD  HYDROcodone-acetaminophen (NORCO) 10-325 MG per tablet Take 2 tablets by mouth every 12 (twelve) hours as needed for moderate pain.    Yes Historical Provider, MD  isosorbide mononitrate (IMDUR) 120 MG 24 hr tablet Take 1 tablet (120 mg total) by mouth daily. 07/05/13  Yes Tarri Fuller, PA-C  morphine (MS CONTIN) 30 MG 12 hr tablet Take 30 mg by mouth every 12 (twelve) hours.  07/07/13  Yes Historical Provider, MD  nitroGLYCERIN (NITROSTAT) 0.4 MG SL tablet Place 0.4 mg under the tongue every 5 (five) minutes as needed for chest pain.  06/01/13  Yes Minus Breeding, MD  ranolazine (RANEXA) 1000 MG SR tablet Take 1 tablet (1,000 mg total) by mouth 2 (two) times daily. 07/05/13  Yes Tarri Fuller, PA-C  rosuvastatin (CRESTOR) 40 MG tablet Take 1 tablet (40 mg total) by mouth daily. 08/26/13  Yes Leeanne Rio, MD  tamsulosin (FLOMAX) 0.4 MG CAPS capsule Take 0.4 mg by mouth daily.   Yes Historical Provider, MD   BP 151/73  Pulse 58  Temp(Src) 98 F (36.7 C) (Oral)  Resp 17  Ht 5\' 7"  (1.702 m)   Wt 165 lb (74.844 kg)  BMI 25.84 kg/m2  SpO2 98% Physical Exam  Nursing note and vitals reviewed. Constitutional: He is oriented to person, place, and time. He appears well-developed and well-nourished. No distress.  HENT:  Head: Normocephalic and atraumatic.  Mouth/Throat: Oropharynx is clear and moist. No oropharyngeal exudate.  Eyes: Conjunctivae and EOM are normal. Pupils are equal, round, and reactive to light.  Neck: Normal range of motion. Neck supple.  No meningismus  Cardiovascular: Normal rate, regular rhythm and normal heart  sounds.   No murmur heard. +2 femoral, DP, PT pulses  Pulmonary/Chest: Effort normal and breath sounds normal. No respiratory distress.  Abdominal: Soft. There is no tenderness. There is no rebound and no guarding.  Musculoskeletal: Normal range of motion. He exhibits no edema and no tenderness.  No calf swelling, tenderness, or palpable cords  Neurological: He is alert and oriented to person, place, and time. No cranial nerve deficit.  5/5 strength throughout, CN 2-12 intact, no ataxia on finger to nose, no nystagmus  Skin: Skin is warm and dry. No rash noted.  Psychiatric: He has a normal mood and affect.    ED Course  Procedures (including critical care time) Labs Review Labs Reviewed  CBC - Abnormal; Notable for the following:    MCV 105.5 (*)    MCH 34.8 (*)    All other components within normal limits  CBC - Abnormal; Notable for the following:    MCV 104.9 (*)    MCH 34.8 (*)    All other components within normal limits  URINALYSIS, ROUTINE W REFLEX MICROSCOPIC - Abnormal; Notable for the following:    Color, Urine AMBER (*)    Bilirubin Urine SMALL (*)    Leukocytes, UA SMALL (*)    All other components within normal limits  BASIC METABOLIC PANEL  TROPONIN I  TROPONIN I  CREATININE, SERUM  URINE MICROSCOPIC-ADD ON  TROPONIN I  TROPONIN I  Randolm Idol, ED    Imaging Review Dg Chest 2 View  10/22/2013   CLINICAL DATA:   Chest pain  EXAM: CHEST  2 VIEW  COMPARISON:  09/21/2013  FINDINGS: Cardiomediastinal silhouette is stable. Status post CABG. Again noted hyperinflation and mild emphysematous changes. No acute infiltrate or pleural effusion. No pulmonary edema. Osteopenia and mild degenerative changes thoracic spine.  IMPRESSION: Status post CABG. Again noted hyperinflation and mild emphysematous changes. No active disease.   Electronically Signed   By: Lahoma Crocker M.D.   On: 10/22/2013 11:21   Ct Head Wo Contrast  10/22/2013   CLINICAL DATA:  Chest pain, bilateral leg numbness  EXAM: CT HEAD WITHOUT CONTRAST  TECHNIQUE: Contiguous axial images were obtained from the base of the skull through the vertex without intravenous contrast.  COMPARISON:  09/22/2013  FINDINGS: No skull fracture is noted. Paranasal sinuses and mastoid air cells are unremarkable. No intracranial hemorrhage, mass effect or midline shift.  Again noted atherosclerotic calcifications of carotid siphon. Old infarcts bilateral pons region. Stable prior infarct in right putamen. Bilateral centrum semi ovale small infarcts are stable. No definite acute new cortical infarction. No mass lesion is noted on this unenhanced scan. Stable old infarct in left frontal lobe. Again noted cavum septum pellucidum. Stable atrophy and chronic white matter disease.  IMPRESSION: No acute intracranial abnormality. Stable atrophy and chronic white matter disease. Stable small chronic infarcts as described above. No definite acute cortical infarction.   Electronically Signed   By: Lahoma Crocker M.D.   On: 10/22/2013 11:46     EKG Interpretation   Date/Time:  Sunday October 22 2013 09:42:20 EDT Ventricular Rate:  50 PR Interval:  177 QRS Duration: 106 QT Interval:  483 QTC Calculation: 440 R Axis:   -10 Text Interpretation:  Sinus rhythm Consider left atrial enlargement  Anteroseptal infarct, age indeterminate Confirmed by RANCOUR  MD, STEPHEN  [phone]) on 10/22/2013 10:12:09  AM      MDM   Final diagnoses:  Chest pain, unspecified chest pain type   Episode of  chest pain, now resolved.  Preceded by ringing in ears and bilateral hand numbness. Admission 1 month ago for TIA.   Initial EKG with artifact.  Second EKG without acute ST change.  CXR negative. CT head stable.  Nonfocal neuro exam.  No tingling currently.  Tinnitus is chronic problem patient states.  Concern for angina.   Cath feb 2014 Final Conclusions: Severe native vessel disease with 2/4 grafts occluded and high grade stenosis in the LAD distal to the LIMA insertion. This vessel is not amenable to PCI. Medical management recommended.  ASA given.  First troponin negative. Remains chest pain free Admission d/w [facility] residents.    Ezequiel Essex, MD 10/22/13 [phone]

## 2013-10-22 NOTE — Progress Notes (Signed)
Bladder scan performed for PVR 39cc

## 2013-10-22 NOTE — H&P (Signed)
[facility] ATTENDING ADMISSION NOTE Chris Eniola,MD I  have seen and examined this patient, reviewed their chart. I have discussed this patient with the resident. I agree with the resident's findings, assessment and care plan.  [name] with Pmx of HTN,HLD,COPD,CAD, presented to the ED with sudden onset of chest pain which started this morning while sitting on a recliner, he denies any exertion at the time. Chest pain he described as being centrally located with no radiation. He denies associated SOB,no diaphoresis,no N/V. He was given Nitroglycerin by EMS which relieved his pain.He feels better now. Patient also mention that he has had multiple similar episodes in the past.  No current facility-administered medications on file prior to encounter.   Current Outpatient Prescriptions on File Prior to Encounter  Medication Sig Dispense Refill  . aspirin EC 81 MG tablet Take 81 mg by mouth daily.      . carvedilol (COREG) 3.125 MG tablet Take 1 tablet (3.125 mg total) by mouth daily with breakfast.  30 tablet  1  . clopidogrel (PLAVIX) 75 MG tablet Take 1 tablet (75 mg total) by mouth daily with breakfast.  30 tablet  0  . HYDROcodone-acetaminophen (NORCO) 10-325 MG per tablet Take 2 tablets by mouth every 12 (twelve) hours as needed for moderate pain.       . isosorbide mononitrate (IMDUR) 120 MG 24 hr tablet Take 1 tablet (120 mg total) by mouth daily.  30 tablet  5  . morphine (MS CONTIN) 30 MG 12 hr tablet Take 30 mg by mouth every 12 (twelve) hours.       . nitroGLYCERIN (NITROSTAT) 0.4 MG SL tablet Place 0.4 mg under the tongue every 5 (five) minutes as needed for chest pain.       . ranolazine (RANEXA) 1000 MG SR tablet Take 1 tablet (1,000 mg total) by mouth 2 (two) times daily.  60 tablet  5  . rosuvastatin (CRESTOR) 40 MG tablet Take 1 tablet (40 mg total) by mouth daily.  30 tablet  1  . tamsulosin (FLOMAX) 0.4 MG CAPS capsule Take 0.4 mg by mouth daily.       Past Medical History   Diagnosis Date  . Hypertension   . Stroke     h/o pontine CVA  . AAA (abdominal aortic aneurysm)     s/p repair 6/11  . Hyperlipidemia   . CAD (coronary artery disease)     s/p CABG 2/12:  LIMA to LAD, SVG to diagonal-1, SVG to ramus intermedius,, SVG to AM (Dr. Roxan Hockey) ;  b.  Myoview 10/13:  inf infarct with very mild peri-infarct ischemia, EF 49%, inf HK; c. Cath 2/25 showed 2/4 occluded grafts & high grade stenosis in the LAD distal to the LIMA insertion, not amenable to PCI - Med Rx  . Carotid stenosis     a.  dopplers 0/10: LICA 27-25%;  b. Carotid dopplers 3/14:  R 0-39%, L 60-79%, repeat 6 mos  . PAD (peripheral artery disease)     ABIs 4/12:  R 0.88, L 0.92; R SFA 40%, L CFA 50%, L SFA 50-60%  . COPD (chronic obstructive pulmonary disease)   . Inguinal hernia   . Thyroid nodule     incidental finding on carotid doppler 3/14 => thyroid U/S ordered  . Anginal pain     occ last 1-2 months ago  . Shortness of breath   . Pneumonia     hx  . GERD (gastroesophageal reflux disease)   .  Myocardial infarction     PCI x3  . TTSVXBLT(903.0)    Filed Vitals:   10/22/13 1300 10/22/13 1301 10/22/13 1440 10/22/13 2125  BP: 123/69 148/63 151/73 112/73  Pulse: 48 48 58 61  Temp:   98 F (36.7 C) 97.6 F (36.4 C)  TempSrc:   Oral Oral  Resp: 15 16 17 18   Height:   5\' 7"  (1.702 m)   Weight:   165 lb (74.844 kg)   SpO2: 97% 98% 98% 96%   Exam: Gen: Patient calm in bed,not in distress. Neuro: Awake and alert, oriented x 3. Resp: Air entry equal and clear B/L. Heart: S1 S2 no murmur, RRR. Abd: Soft, NT/ND, BS+ Ext: No edema.  A/P: 63 Y/O M with Chest pain R/O ACS vs Angina.        EKG reviewed ;sinus brady with Twave inversion on anterior-septal leads, just slightly different from previous EKG in May.        Troponin negative x 3         Order risk stratification labs.        Monitor cardiac activity on telemetry.        ASA 81 mg qd. Nitroglycerin prn chest pain.         Betablocker and statin.        Cardiology consult for possible stress test in the morning.   Chronic problem: COPD, HTN, HLD: Review home medications and restart as appropriate.

## 2013-10-22 NOTE — H&P (Signed)
[facility] Admission History and Physical Service Pager: [phone]  Patient name: Chris Payne Medical record number: [id number] Date of birth: 04-01-1951 Age: 63 y.o. Gender: male  Primary Care Provider: Ellsworth Lennox, MD Consultants: Cardiology Code Status: Full  Chief Complaint: Chest pain  Assessment and Plan: Chris Payne is a 63 y.o. male presenting with atypical chest pain. PMH is significant for CAD s/p CABG, TIA, HTN,  AAA repair, chronic back pain, and BPH.  Atypical angina: Substernal, non-exertional, relieved by NTG. Sees Dr. Percival Spanish. Echocardiogram 08/24/2013: normal EF, no diastolic dysfunction or regional wall motion abnormalities.  - Admit to FMTS on telemetry for chest pain observation; formally consult cardiology in AM. - Repeat ECG in AM (bradycardic, with stable T-wave flattening/inversions in V1-3, no ST-segment changes in ED) - Cycle troponins (1st is neg) - Euvolemic - Lipid panel, Hb A1c, TSH - Aspirin, beta blocker, high-intensity statin, imdur - Continue morphine home medication - Nitroglycerin 0.4mg  SL q57min prn chest pain  - O2 by [place] prn hypoxemia - Any additional workup is per cardiology.  PAD: History of AAA repair, mildly abnormal ABIs, carotid artery stenosis. Sees Dr. Trula Slade.  - Continue ranexa  BPH: With history of urinary retention and recurrent UTIs. Sees urology as an outpatient. Still has symptoms of hesitancy and dysuria. - Increase flomax to 0.8mg  daily - Urinalysis and post-void residual scan   Chronic lower back pain: Sees pain management in [place] and has a stable dose of opiates from last admission.  - Continue home MS contin 30mg  BID and norco 10/325 x2 tablets BID  Tinnitus: Chronic problem.  - ?Due to ASA, ?TIA sequelae. Consider further work up as outpatient  FEN/GI: Heart healthy diet, saline lock IV Prophylaxis: Lovenox  Disposition: Admit to FMTS on telemetry for chest pain  observation  History of Present Illness: Chris Payne is a 63 y.o. male presenting with chest pain.   Chris Payne describes waking up this morning around 8:00am with substernal chest pain which was not worsened by exertion, breathing, or changed with position. The pain was moderate-severe, constant but waxing and waning, and not associated with sweating, nausea or vomiting. He endorses some associated numbness in his hands during this episode as well as ringing in his ears for the past month and a long history of dizziness that is present when standing and when sitting. This pain resembled prior episodes of angina, so he called EMS. He was given nitroglycerin which alleviated his pain. He does have nitroglycerin available at home. He has not since had another episode of chest pain, tinging, or ringing in his ears.  He has a history of a CVA without residual deficits and takes aspirin 81mg  only for this. He endorses taking this and all other prescribed medications diligently as prescribed. 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 No family history of early CVD.   Review Of Systems:  Per HPI with the following additions:  - fever, chills, shortness of breath - orthopnea, PND + pedal edema at the end of the day + daily cough with white phlegm worst in the morning.  + claudication + chronic lower back pain + poor urinary stream without incontinence, improved with flomax + dysuria + chronic constipation which he treats intermittently with suppositories - myalgias, arthralgias, rashes  Otherwise 12 point review of systems was performed and was unremarkable.  Patient Active Problem List  Diagnosis Date Noted  . Low back pain 09/05/2013  . TIA (transient ischemic attack) 08/23/2013  . Other and unspecified angina pectoris 07/04/2013  . Nontoxic multinodular goiter 10/07/2012  . Unstable angina  06/19/2012  . Carotid stenosis   . PAD (peripheral artery disease)   . Left inguinal hernia 05/30/2012  . COPD (chronic obstructive pulmonary disease) 08/24/2011  . S/P AAA repair 08/24/2011  . Lumbar disc disease   . Hypertensive heart disease   . Hyperlipidemia   . CAD (coronary artery disease)   . History of pontine CVA   . GERD (gastroesophageal reflux disease)    Past Medical History: Past Medical History  Diagnosis Date  . Hypertension   . Stroke     h/o pontine CVA  . AAA (abdominal aortic aneurysm)     s/p repair 6/11  . Hyperlipidemia   . CAD (coronary artery disease)     s/p CABG 2/12:  LIMA to LAD, SVG to diagonal-1, SVG to ramus intermedius,, SVG to AM (Dr. Roxan Hockey) ;  b.  Myoview 10/13:  inf infarct with very mild peri-infarct ischemia, EF 49%, inf HK; c. Cath 2/25 showed 2/4 occluded grafts & high grade stenosis in the LAD distal to the LIMA insertion, not amenable to PCI - Med Rx  . Carotid stenosis     a.  dopplers 2/99: LICA 24-26%;  b. Carotid dopplers 3/14:  R 0-39%, L 60-79%, repeat 6 mos  . PAD (peripheral artery disease)     ABIs 4/12:  R 0.88, L 0.92; R SFA 40%, L CFA 50%, L SFA 50-60%  . COPD (chronic obstructive pulmonary disease)   . Inguinal hernia   . Thyroid nodule     incidental finding on carotid doppler 3/14 => thyroid U/S ordered  . Myocardial infarction   . Anginal pain     occ last 1-2 months ago  . Shortness of breath   . Pneumonia     hx  . GERD (gastroesophageal reflux disease)    Past Surgical History: Past Surgical History  Procedure Laterality Date  . Hip surgery  40 years ago    left hip bone removal and pinning  . Abdominal aortic aneurysm repair  10-03-2009  . Exploratory laparotomy  09/2009    ligation of lumbar arteries  . Coronary artery bypass graft  06/24/2010    LIMA to the LAD, SVG to first diagonal, SVG to ramus intermediate, SVG to acute marginal. EF 50%. 2/12  . External ear surgery Left     laceration child  .  Inguinal hernia repair Left 07/21/2012    Procedure: HERNIA REPAIR INGUINAL ADULT;  Surgeon: Merrie Roof, MD;  Location: [facility];  Service: General;  Laterality: Left;  . Insertion of mesh Left 07/21/2012    Procedure: INSERTION OF MESH;  Surgeon: Merrie Roof, MD;  Location: [facility];  Service: General;  Laterality: Left;  . Exploration post operative open heart    . Nm myocar perf wall motion  12/02/2010    inferoapical defect is fixed c/w prior infarction/scar, there is minimal borderzone ischemia.   Social History: History  Substance Use Topics  . Smoking status: Former Smoker -- 1.00 packs/day for 30 years    Types: Cigarettes    Quit date: 01/25/2010  . Smokeless tobacco: Never Used  . Alcohol Use: No     Comment: former drinker - Quit 2011.   Additional social history: Lives in [place] home with his wife. He quit smoking  and drinking (drank 5-6 drinks/day) after his CABG. His wife still smokes. Denies illicit drugs. He's on disability.  Please also refer to relevant sections of EMR.  Family History: Family History  Problem Relation Age of Onset  . Alcohol abuse Father   . Cancer Sister    Allergies and Medications: No Known Allergies No current facility-administered medications on file prior to encounter.   Current Outpatient Prescriptions on File Prior to Encounter  Medication Sig Dispense Refill  . aspirin EC 81 MG tablet Take 81 mg by mouth daily.      . carvedilol (COREG) 3.125 MG tablet Take 1 tablet (3.125 mg total) by mouth daily with breakfast.  30 tablet  1  . clopidogrel (PLAVIX) 75 MG tablet Take 1 tablet (75 mg total) by mouth daily with breakfast.  30 tablet  0  . HYDROcodone-acetaminophen (NORCO) 10-325 MG per tablet Take 2 tablets by mouth every 12 (twelve) hours as needed for moderate pain.       . isosorbide mononitrate (IMDUR) 120 MG 24 hr tablet Take 1 tablet (120 mg total) by mouth daily.  30 tablet  5  . morphine (MS CONTIN) 30 MG 12 hr tablet Take  30 mg by mouth every 12 (twelve) hours.       . nitroGLYCERIN (NITROSTAT) 0.4 MG SL tablet Place 0.4 mg under the tongue every 5 (five) minutes as needed for chest pain.       . ranolazine (RANEXA) 1000 MG SR tablet Take 1 tablet (1,000 mg total) by mouth 2 (two) times daily.  60 tablet  5  . rosuvastatin (CRESTOR) 40 MG tablet Take 1 tablet (40 mg total) by mouth daily.  30 tablet  1  . tamsulosin (FLOMAX) 0.4 MG CAPS capsule Take 0.4 mg by mouth daily.        Objective: BP 148/63  Pulse 48  Resp 16  Ht 5\' 7"  (1.702 m)  Wt 165 lb (74.844 kg)  BMI 25.84 kg/m2  SpO2 98% Exam: General: Pleasant 63 y.o. male eating a meal in NAD HEENT: MMM, conjunctivae normal, nares patent, oropharynx clear, dentures in place Cardiovascular: Borderline bradycardic rate, no murmur or gallop, radial pulses 2+, pedal pulses 1+. Sternal sutures palpable Respiratory: Non-labored on ambient air, clear, distant breath sounds Abdomen: Normoactive BS, soft, NT, ND, palpable stool burden  Extremities: Trace pitting LE edema, warm, well perfused. Slightly delayed cap refill. Skin: Scattered seborrheic keratoses throughout, telangiectasias across chest, no wounds or rashes Neuro: Alert and oriented, speech normal, CN II-XII intact.   Labs and Imaging: CBC BMET   Recent Labs Lab 10/22/13 1005  WBC 7.5  HGB 15.8  HCT 47.9  PLT 158    Recent Labs Lab 10/22/13 1005  NA 142  K 4.8  CL 102  CO2 29  BUN 16  CREATININE 0.84  GLUCOSE 88  CALCIUM 9.1     CT HEAD W/O CONTRAST 6/28 No skull fracture is noted. Paranasal sinuses and mastoid air cells  are unremarkable. No intracranial hemorrhage, mass effect or midline  shift.  Again noted atherosclerotic calcifications of carotid siphon. Old  infarcts bilateral pons region. Stable prior infarct in right  putamen. Bilateral centrum semi ovale small infarcts are stable. No  definite acute new cortical infarction. No mass lesion is noted on  this  unenhanced scan. Stable old infarct in left frontal lobe. Again  noted cavum septum pellucidum. Stable atrophy and chronic white  matter disease.  IMPRESSION:  No acute intracranial abnormality. Stable  atrophy and chronic white  matter disease. Stable small chronic infarcts as described above. No  definite acute cortical infarction.  CXR 6/28 Cardiomediastinal silhouette is stable. Status post CABG. Again  noted hyperinflation and mild emphysematous changes. No acute  infiltrate or pleural effusion. No pulmonary edema. Osteopenia and  mild degenerative changes thoracic spine.  IMPRESSION:  Status post CABG. Again noted hyperinflation and mild emphysematous  changes. No active disease.  Vance Gather, MD 10/22/2013, 1:05 PM PGY-1, [place] Intern pager: [phone], text pages welcome  PGY-3 Addendum I have seen and examined this patient.  I agree with the above note with my edits in pink.  HONIG, ERIN 10/22/2013, 3:35 PM

## 2013-10-22 NOTE — ED Notes (Signed)
Pt undressed, in gown, on monitor, continuous pulse oximetry and blood pressure cuff 

## 2013-10-23 ENCOUNTER — Observation Stay (HOSPITAL_COMMUNITY): Payer: Medicare HMO

## 2013-10-23 DIAGNOSIS — R079 Chest pain, unspecified: Secondary | ICD-10-CM

## 2013-10-23 DIAGNOSIS — Z9889 Other specified postprocedural states: Secondary | ICD-10-CM

## 2013-10-23 LAB — TROPONIN I: Troponin I: <0.3 ng/mL (ref <0.30)

## 2013-10-23 MED ORDER — REGADENOSON 0.4 MG/5ML IV SOLN
0.4000 mg | Freq: Once | INTRAVENOUS | Status: AC
  Administered 2013-10-23: 0.4 mg via INTRAVENOUS

## 2013-10-23 MED ORDER — TECHNETIUM TC 99M SESTAMIBI GENERIC - CARDIOLITE
10.0000 | Freq: Once | INTRAVENOUS | Status: AC | PRN
  Administered 2013-10-23: 10 via INTRAVENOUS

## 2013-10-23 MED ORDER — TECHNETIUM TC 99M SESTAMIBI GENERIC - CARDIOLITE
30.0000 | Freq: Once | INTRAVENOUS | Status: AC | PRN
  Administered 2013-10-23: 30 via INTRAVENOUS

## 2013-10-23 MED ORDER — REGADENOSON 0.4 MG/5ML IV SOLN
INTRAVENOUS | Status: AC
  Filled 2013-10-23: qty 5

## 2013-10-23 NOTE — Progress Notes (Signed)
Chest pain rounds:   Chart reviewed. No further chest pain. Minor T wave changes on EKG . Enzymes negative. Walks with walker.  Will get lexiscan myoview today. Home later today if myoview is negative.

## 2013-10-23 NOTE — Discharge Summary (Signed)
[facility] Discharge Summary  Patient name: Chris Payne Medical record number: [id number] Date of birth: November 26, 1950 Age: 63 y.o. Gender: male Date of Admission: 10/22/2013  Date of Discharge: 10/23/2013 Admitting Physician: Andrena Mews, MD  Primary Care Provider: Ellsworth Lennox, MD Consultants: Cardiology  Indication for Hospitalization: Chest pain, atypical  Discharge Diagnoses/Problem List:  Patient Active Problem List   Diagnosis Date Noted  . Atypical angina 10/22/2013  . Low back pain 09/05/2013  . TIA (transient ischemic attack) 08/23/2013  . Other and unspecified angina pectoris 07/04/2013  . Nontoxic multinodular goiter 10/07/2012  . Unstable angina 06/19/2012  . Carotid stenosis   . PAD (peripheral artery disease)   . Left inguinal hernia 05/30/2012  . COPD (chronic obstructive pulmonary disease) 08/24/2011  . S/P AAA repair 08/24/2011  . Lumbar disc disease   . Hypertensive heart disease   . Hyperlipidemia   . CAD (coronary artery disease)   . History of pontine CVA   . GERD (gastroesophageal reflux disease)    Disposition: Discharged home  Discharge Condition: Stable  Discharge Exam:  General: Pleasant 63 y.o. male sitting in bed in NAD  Cardiovascular: Borderline bradycardic rate, no murmur or gallop, radial pulses 2+, pedal pulses 1+. Trace pitting LE edema  Respiratory: Non-labored on ambient air, clear, distant breath sounds  Skin: Scattered seborrheic keratoses throughout, telangiectasias across chest, no wounds or rashes   Brief Hospital Course:  Chris Payne is a 62 y.o. male presenting on 6/28 with atypical chest pain. PMH is significant for CAD s/p CABG, TIA, HTN, AAA repair, chronic back pain, and BPH.   Chris Payne described the pain as substernal, non-exertional, relieved by NTG. His cardiologist is Dr. Percival Spanish. Echocardiogram 08/24/2013 showed normal EF, no diastolic dysfunction or regional wall motion  abnormalities. Cardiology was consulted and a myoview was performed which was non-ischemic. Cardiac enzymes were negative and ECG showed stable bradycardia without ischemic changes. Home medications, including beta blocker, ASA, statin, imdur, and morphine were continued.   Ranexa was continued for his PAD. Flomax was continued for BPH, postvoid residual was < 40cc, and urinalysis showed small leukocytes without nitrites. No treatment was given. He also complained of tinnitus for the past several months. Work up for this is left to the discretion of his PCP.  Issues for Follow Up:  - Continued medical management of cardiovascular disease. - Consider bowel regimen for chronic constipation in the setting of chronic opioid therapy. - Consider further work up for tinnitus.   Significant Procedures: Myoview  Significant Labs and Imaging:   Recent Labs Lab 10/22/13 1005 10/22/13 1520  WBC 7.5 5.9  HGB 15.8 15.6  HCT 47.9 47.0  PLT 158 159    Recent Labs Lab 10/22/13 1005 10/22/13 1520  NA 142  --   K 4.8  --   CL 102  --   CO2 29  --   GLUCOSE 88  --   BUN 16  --   CREATININE 0.84 0.74  CALCIUM 9.1  --    CT HEAD W/O CONTRAST 6/28  No skull fracture is noted. Paranasal sinuses and mastoid air cells  are unremarkable. No intracranial hemorrhage, mass effect or midline  shift.  Again noted atherosclerotic calcifications of carotid siphon. Old  infarcts bilateral pons region. Stable prior infarct in right  putamen. Bilateral centrum semi ovale small infarcts are stable. No  definite acute new cortical infarction. No mass lesion is noted on  this unenhanced scan. Stable old infarct  in left frontal lobe. Again  noted cavum septum pellucidum. Stable atrophy and chronic white  matter disease.  IMPRESSION:  No acute intracranial abnormality. Stable atrophy and chronic white  matter disease. Stable small chronic infarcts as described above. No  definite acute cortical infarction.    CXR 6/28  Cardiomediastinal silhouette is stable. Status post CABG. Again  noted hyperinflation and mild emphysematous changes. No acute  infiltrate or pleural effusion. No pulmonary edema. Osteopenia and  mild degenerative changes thoracic spine.  IMPRESSION:  Status post CABG. Again noted hyperinflation and mild emphysematous  changes. No active disease.  Results/Tests Pending at Time of Discharge: None  Discharge Medications:    Medication List    STOP taking these medications       rosuvastatin 40 MG tablet  Commonly known as:  CRESTOR      TAKE these medications       aspirin EC 81 MG tablet  Take 81 mg by mouth daily.     atorvastatin 80 MG tablet  Commonly known as:  LIPITOR  Take 80 mg by mouth daily.     carvedilol 3.125 MG tablet  Commonly known as:  COREG  Take 1 tablet (3.125 mg total) by mouth daily with breakfast.     clopidogrel 75 MG tablet  Commonly known as:  PLAVIX  Take 1 tablet (75 mg total) by mouth daily with breakfast.     HYDROcodone-acetaminophen 10-325 MG per tablet  Commonly known as:  NORCO  Take 2 tablets by mouth every 12 (twelve) hours as needed for moderate pain.     isosorbide mononitrate 120 MG 24 hr tablet  Commonly known as:  IMDUR  Take 1 tablet (120 mg total) by mouth daily.     morphine 30 MG 12 hr tablet  Commonly known as:  MS CONTIN  Take 30 mg by mouth every 12 (twelve) hours.     nitroGLYCERIN 0.4 MG SL tablet  Commonly known as:  NITROSTAT  Place 0.4 mg under the tongue every 5 (five) minutes as needed for chest pain.     ranolazine 1000 MG SR tablet  Commonly known as:  RANEXA  Take 1 tablet (1,000 mg total) by mouth 2 (two) times daily.     tamsulosin 0.4 MG Caps capsule  Commonly known as:  FLOMAX  Take 0.4 mg by mouth daily.        Discharge Instructions: Please refer to Patient Instructions section of EMR for full details.  Patient was counseled important signs and symptoms that should prompt  return to medical care, changes in medications, dietary instructions, activity restrictions, and follow up appointments.   Follow-Up Appointments: Follow-up Information   Follow up with [facility] R, NP On 11/10/2013. (2 pm)    Specialty:  Cardiology   Contact information:   [address] [place] [address] [phone]       Schedule an appointment as soon as possible for a visit with Ellsworth Lennox, MD.   Specialty:  Family Medicine   Contact information:   [place] [address] [phone]       Vance Gather, MD 10/26/2013, 1:57 AM PGY-1, [place]

## 2013-10-23 NOTE — Progress Notes (Signed)
Reviewed discharge instructions with patient and family, they states their understanding.  Discharged via wheelchair home with family.  Patient states he will make primary care followup, office closed at this time of the afternoon.  Sanda Linger

## 2013-10-23 NOTE — Progress Notes (Signed)
Family Medicine Teaching Service Daily Progress Note Intern Pager: [phone]  Patient name: Chris Payne Medical record number: [id number] Date of birth: 19-Jul-1950 Age: 63 y.o. Gender: male  Primary Care Griselda Tosh: Ellsworth Lennox, MD Consultants: Cardiology Code Status: Full  Pt Overview and Major Events to Date:  6/28: Admitted with atypical chest pain 6/29: Myoview  Assessment and Plan: Chris Payne is a 63 y.o. male presenting with atypical chest pain. PMH is significant for CAD s/p CABG, TIA, HTN, AAA repair, chronic back pain, and BPH.   Atypical angina: Substernal, non-exertional, relieved by NTG. Sees Dr. Percival Spanish. Echocardiogram 08/24/2013: normal EF, no diastolic dysfunction or regional wall motion abnormalities.  - Myoview today per cardiology - Repeat ECG in AM (bradycardic, with stable T-wave flattening/inversions in V1-3, no ST-segment changes in ED)  - Cardiac enzymes negative - Lipid panel, Hb A1c, TSH  - Aspirin, beta blocker, high-intensity statin, imdur  - Continue morphine home medication  - Nitroglycerin 0.4mg  SL q21min prn chest pain  - O2 by [place] prn hypoxemia   PAD: History of AAA repair, mildly abnormal ABIs, carotid artery stenosis. Sees Dr. Trula Slade.  - Continue ranexa   BPH: With history of urinary retention and recurrent UTIs. Sees urology as an outpatient.  - Increase flomax to 0.8mg  daily  - U/A: 3-6 WBC/HPF, negative nitrites - Post-void residual < 40cc  Chronic lower back pain: Sees pain management in [place] and has a stable dose of opiates from last admission.  - Continue home MS contin 30mg  BID and norco 10/325 x2 tablets BID   Tinnitus: Chronic problem.  - ?Due to ASA, ?TIA sequelae. Consider further work up as outpatient   FEN/GI: Heart healthy diet, saline lock IV  Prophylaxis: Lovenox  Disposition: Myoview today - home today if negative  Subjective:  Denies chest pain, SOB, palpitations. No concerns.   Objective: Temp:   [97.6 F (36.4 C)-98 F (36.7 C)] 97.6 F (36.4 C) (06/29 0530) Pulse Rate:  [45-64] 64 (06/29 0530) Resp:  [13-21] 18 (06/29 0530) BP: (112-151)/(57-75) 128/75 mmHg (06/29 0530) SpO2:  [93 %-100 %] 93 % (06/29 0530) Weight:  [164 lb 10.9 oz (74.7 kg)-165 lb (74.844 kg)] 164 lb 10.9 oz (74.7 kg) (06/29 0530) Physical Exam: General: Pleasant 63 y.o. male eating a meal in NAD  Cardiovascular: Borderline bradycardic rate, no murmur or gallop, radial pulses 2+, pedal pulses 1+. Trace pitting LE edema Respiratory: Non-labored on ambient air, clear, distant breath sounds  Abdomen: Normoactive BS, soft, NT, ND, palpable stool burden  Skin: Scattered seborrheic keratoses throughout, telangiectasias across chest, no wounds or rashes  Neuro: Alert and oriented, speech normal, CN II-XII intact.   Laboratory:  Recent Labs Lab 10/22/13 1005 10/22/13 1520  WBC 7.5 5.9  HGB 15.8 15.6  HCT 47.9 47.0  PLT 158 159    Recent Labs Lab 10/22/13 1005 10/22/13 1520  NA 142  --   K 4.8  --   CL 102  --   CO2 29  --   BUN 16  --   CREATININE 0.84 0.74  CALCIUM 9.1  --   GLUCOSE 88  --    Imaging/Diagnostic Tests: CT HEAD W/O CONTRAST 6/28  No skull fracture is noted. Paranasal sinuses and mastoid air cells  are unremarkable. No intracranial hemorrhage, mass effect or midline  shift.  Again noted atherosclerotic calcifications of carotid siphon. Old  infarcts bilateral pons region. Stable prior infarct in right  putamen. Bilateral centrum semi ovale small infarcts are stable.  No  definite acute new cortical infarction. No mass lesion is noted on  this unenhanced scan. Stable old infarct in left frontal lobe. Again  noted cavum septum pellucidum. Stable atrophy and chronic white  matter disease.  IMPRESSION:  No acute intracranial abnormality. Stable atrophy and chronic white  matter disease. Stable small chronic infarcts as described above. No  definite acute cortical infarction.    CXR 6/28  Cardiomediastinal silhouette is stable. Status post CABG. Again  noted hyperinflation and mild emphysematous changes. No acute  infiltrate or pleural effusion. No pulmonary edema. Osteopenia and  mild degenerative changes thoracic spine.  IMPRESSION:  Status post CABG. Again noted hyperinflation and mild emphysematous  changes. No active disease.   Vance Gather, MD 10/23/2013, 8:07 AM PGY-1, [place] Intern pager: [phone], text pages welcome

## 2013-10-23 NOTE — Progress Notes (Signed)
CALL PAGER [phone] for any questions or notifications regarding this patient   FMTS Attending Daily Note: Sara Neal MD  Attending pager:[phone]  office [phone]  I  have seen and examined this patient, reviewed their chart. I have discussed this patient with the resident. I agree with the resident's findings, assessment and care plan. 

## 2013-10-23 NOTE — Discharge Instructions (Signed)
[name] you were admitted due to chest pain that was concerning for a problem with your heart. After observation and testing we have determined that your heart is fine, and you are ready for discharge.   - No medication changes have been made, but you do have follow up with cardiology as an outpatient.  - You should also call to schedule a follow up appointment with your PCP.   If you experience chest pain or shortness of breath again you should seek medical attention.   We hope you feel better soon,  - Family Medicine Teaching Service    Chest Pain (Nonspecific) It is often hard to give a diagnosis for the cause of chest pain. There is always a chance that your pain could be related to something serious, such as a heart attack or a blood clot in the lungs. You need to follow up with your doctor. HOME CARE  If antibiotic medicine was given, take it as directed by your doctor. Finish the medicine even if you start to feel better.  For the next few days, avoid activities that bring on chest pain. Continue physical activities as told by your doctor.  Do not use any tobacco products. This includes cigarettes, chewing tobacco, and e-cigarettes.  Avoid drinking alcohol.  Only take medicine as told by your doctor.  Follow your doctor's suggestions for more testing if your chest pain does not go away.  Keep all doctor visits you made. GET HELP IF:  Your chest pain does not go away, even after treatment.  You have a rash with blisters on your chest.  You have a fever. GET HELP RIGHT AWAY IF:   You have more pain or pain that spreads to your arm, neck, jaw, back, or belly (abdomen).  You have shortness of breath.  You cough more than usual or cough up blood.  You have very bad back or belly pain.  You feel sick to your stomach (nauseous) or throw up (vomit).  You have very bad weakness.  You pass out (faint).  You have chills. This is an emergency. Do not wait to see if  the problems will go away. Call your local emergency services (911 in [place] Do not drive yourself to the hospital. MAKE SURE YOU:   Understand these instructions.  Will watch your condition.  Will get help right away if you are not doing well or get worse. Document Released: 09/30/2007 Document Revised: 04/18/2013 Document Reviewed: 09/30/2007 [facility] Patient Information 2015 [place], [place]. This information is not intended to replace advice given to you by your health care provider. Make sure you discuss any questions you have with your health care provider.

## 2013-10-23 NOTE — Progress Notes (Signed)
UR Completed.  Brown, Sarah Jane [phone] 10/23/2013  

## 2013-10-26 ENCOUNTER — Other Ambulatory Visit: Payer: Self-pay

## 2013-10-26 MED ORDER — CARVEDILOL 3.125 MG PO TABS: 3.1250 mg | ORAL_TABLET | Freq: Every day | ORAL | Status: DC

## 2013-10-27 ENCOUNTER — Telehealth: Payer: Self-pay

## 2013-10-27 NOTE — Telephone Encounter (Signed)
Pt is scheduled for a hospital f/u visit w/ Dr. Leward Quan on Tuesday the 7th, Nurse from [facility] care manage wanted to ask if she could evaluated to see if he needs physical therapy, and also go ober his blood pressure medline.

## 2013-10-27 NOTE — Telephone Encounter (Signed)
Please contact [place] (no name in message); give verbal authorization for home assessment and services as warranted. Thank you.

## 2013-10-29 NOTE — Telephone Encounter (Signed)
What is the contact number for the nurse?

## 2013-10-31 ENCOUNTER — Emergency Department (HOSPITAL_COMMUNITY)
Admission: EM | Admit: 2013-10-31 | Discharge: 2013-10-31 | Disposition: A | Discharge status: Home / Self Care | Payer: Medicare HMO | Attending: Emergency Medicine | Admitting: Emergency Medicine

## 2013-10-31 ENCOUNTER — Encounter: Payer: Commercial Managed Care - HMO | Admitting: Neurology

## 2013-10-31 ENCOUNTER — Emergency Department (HOSPITAL_COMMUNITY): Payer: Medicare HMO

## 2013-10-31 ENCOUNTER — Encounter (HOSPITAL_COMMUNITY): Payer: Self-pay | Admitting: Emergency Medicine

## 2013-10-31 DIAGNOSIS — Z8673 Personal history of transient ischemic attack (TIA), and cerebral infarction without residual deficits: Secondary | ICD-10-CM | POA: Insufficient documentation

## 2013-10-31 DIAGNOSIS — E785 Hyperlipidemia, unspecified: Secondary | ICD-10-CM | POA: Insufficient documentation

## 2013-10-31 DIAGNOSIS — R5383 Other fatigue: Secondary | ICD-10-CM

## 2013-10-31 DIAGNOSIS — Z8719 Personal history of other diseases of the digestive system: Secondary | ICD-10-CM | POA: Insufficient documentation

## 2013-10-31 DIAGNOSIS — Z87891 Personal history of nicotine dependence: Secondary | ICD-10-CM | POA: Insufficient documentation

## 2013-10-31 DIAGNOSIS — Z7982 Long term (current) use of aspirin: Secondary | ICD-10-CM | POA: Insufficient documentation

## 2013-10-31 DIAGNOSIS — J4489 Other specified chronic obstructive pulmonary disease: Secondary | ICD-10-CM | POA: Insufficient documentation

## 2013-10-31 DIAGNOSIS — H9319 Tinnitus, unspecified ear: Secondary | ICD-10-CM | POA: Insufficient documentation

## 2013-10-31 DIAGNOSIS — I10 Essential (primary) hypertension: Secondary | ICD-10-CM | POA: Insufficient documentation

## 2013-10-31 DIAGNOSIS — J449 Chronic obstructive pulmonary disease, unspecified: Secondary | ICD-10-CM | POA: Insufficient documentation

## 2013-10-31 DIAGNOSIS — Z8701 Personal history of pneumonia (recurrent): Secondary | ICD-10-CM | POA: Insufficient documentation

## 2013-10-31 DIAGNOSIS — Z79899 Other long term (current) drug therapy: Secondary | ICD-10-CM | POA: Insufficient documentation

## 2013-10-31 DIAGNOSIS — R5381 Other malaise: Secondary | ICD-10-CM | POA: Insufficient documentation

## 2013-10-31 DIAGNOSIS — R42 Dizziness and giddiness: Secondary | ICD-10-CM | POA: Insufficient documentation

## 2013-10-31 DIAGNOSIS — R259 Unspecified abnormal involuntary movements: Secondary | ICD-10-CM | POA: Insufficient documentation

## 2013-10-31 DIAGNOSIS — R251 Tremor, unspecified: Secondary | ICD-10-CM

## 2013-10-31 DIAGNOSIS — Z951 Presence of aortocoronary bypass graft: Secondary | ICD-10-CM | POA: Insufficient documentation

## 2013-10-31 DIAGNOSIS — I2510 Atherosclerotic heart disease of native coronary artery without angina pectoris: Secondary | ICD-10-CM | POA: Insufficient documentation

## 2013-10-31 DIAGNOSIS — I252 Old myocardial infarction: Secondary | ICD-10-CM | POA: Insufficient documentation

## 2013-10-31 DIAGNOSIS — Z7902 Long term (current) use of antithrombotics/antiplatelets: Secondary | ICD-10-CM | POA: Insufficient documentation

## 2013-10-31 DIAGNOSIS — H538 Other visual disturbances: Secondary | ICD-10-CM | POA: Insufficient documentation

## 2013-10-31 LAB — COMPREHENSIVE METABOLIC PANEL
ALBUMIN: 3.2 g/dL — AB (ref 3.5–5.2)
ALK PHOS: 80 U/L (ref 39–117)
ALT: 10 U/L (ref 0–53)
AST: 16 U/L (ref 0–37)
Anion gap: 13 (ref 5–15)
BILIRUBIN TOTAL: 0.6 mg/dL (ref 0.3–1.2)
BUN: 20 mg/dL (ref 6–23)
CHLORIDE: 97 meq/L (ref 96–112)
CO2: 26 mEq/L (ref 19–32)
Calcium: 9.1 mg/dL (ref 8.4–10.5)
Creatinine, Ser: 0.82 mg/dL (ref 0.50–1.35)
GFR calc Af Amer: >90 mL/min (ref >90)
GFR calc non Af Amer: >90 mL/min (ref >90)
Glucose, Bld: 113 mg/dL — ABNORMAL HIGH (ref 70–99)
POTASSIUM: 4.2 meq/L (ref 3.7–5.3)
SODIUM: 136 meq/L — AB (ref 137–147)
TOTAL PROTEIN: 6.9 g/dL (ref 6.0–8.3)

## 2013-10-31 LAB — CBC WITH DIFFERENTIAL/PLATELET
BASOS ABS: 0 10*3/uL (ref 0.0–0.1)
BASOS PCT: 0 % (ref 0–1)
EOS ABS: 0.1 10*3/uL (ref 0.0–0.7)
Eosinophils Relative: 1 % (ref 0–5)
HCT: 43.7 % (ref 39.0–52.0)
HEMOGLOBIN: 14.4 g/dL (ref 13.0–17.0)
Lymphocytes Relative: 17 % (ref 12–46)
Lymphs Abs: 1.3 10*3/uL (ref 0.7–4.0)
MCH: 33.7 pg (ref 26.0–34.0)
MCHC: 33 g/dL (ref 30.0–36.0)
MCV: 102.3 fL — ABNORMAL HIGH (ref 78.0–100.0)
Monocytes Absolute: 0.8 10*3/uL (ref 0.1–1.0)
Monocytes Relative: 11 % (ref 3–12)
NEUTROS ABS: 5.6 10*3/uL (ref 1.7–7.7)
Neutrophils Relative %: 71 % (ref 43–77)
PLATELETS: 213 10*3/uL (ref 150–400)
RBC: 4.27 MIL/uL (ref 4.22–5.81)
RDW: 13.1 % (ref 11.5–15.5)
WBC: 7.8 10*3/uL (ref 4.0–10.5)

## 2013-10-31 LAB — I-STAT TROPONIN, ED: Troponin i, poc: 0 ng/mL (ref 0.00–0.08)

## 2013-10-31 LAB — PROTIME-INR
INR: 1.1 (ref 0.00–1.49)
PROTHROMBIN TIME: 14.2 s (ref 11.6–15.2)

## 2013-10-31 LAB — APTT: aPTT: 39 seconds — ABNORMAL HIGH (ref 24–37)

## 2013-10-31 NOTE — Telephone Encounter (Signed)
Nurse contact name was cathy, number left was [phone]

## 2013-10-31 NOTE — ED Notes (Addendum)
Per EMS- Patient c/o increased tremors and weakness. Patient has a history of the same due to previous CVA. Patient normally walks with a walker and normally has tremors. Patient's significant other called EMS and stated that the tremors were worse. EMS reported that the tremors had resolved upon arrival to the ED. Patient normally has slurred speech due to previous stroke.

## 2013-10-31 NOTE — ED Notes (Addendum)
Pt reports hx of intermittent tremors. States he was walking to bathroom with walker when he started having real bad tremors and had to sit down. Pt not having tremors at this time. Denies weakness. Pt reports slight frontal headache. Pt States he had blurred vision for about an hour earlier today that has since resolved.

## 2013-10-31 NOTE — ED Provider Notes (Signed)
CSN: [id number]     Arrival date & time 10/31/13  1800 History   First MD Initiated Contact with Patient 10/31/13 1808     Chief Complaint  Patient presents with  . Weakness  . Tremors     (Consider location/radiation/quality/duration/timing/severity/associated sxs/prior Treatment) The history is provided by the patient and medical records.   This is a 63 year old male with extensive past medical history including hypertension, coronary artery disease, MI with CABG, prior stroke, presenting to the ED for tremors.  Patient states around 5 PM he got out of his chair and attempted to walk to the bathroom in his home using his walker. He states he felt increasingly tremulous, lightheaded, with blurred vision.  He states he did not fall but had to go back to his chair and sit down. He states he always has tremors at baseline, worse in his hands, but this episode seem more intense than normal.  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 He usually wears glasses.  Patient denies any numbness, weakness, or paresthesias of extremities.  No recent illness, fevers, sweats, or chills.  On arrival, pt states all sx had resolved except his vision still seems mildly blurred.  Pt is on ASA and plavix. Patient followed by [place] neurology, Dr. Krista Blue. VS stable.  Past Medical History  Diagnosis Date  . Hypertension   . Stroke     h/o pontine CVA  . AAA (abdominal aortic aneurysm)     s/p repair 6/11  . Hyperlipidemia   . CAD (coronary artery disease)     s/p CABG 2/12:  LIMA to LAD, SVG to diagonal-1, SVG to ramus intermedius,, SVG to AM (Dr. Roxan Hockey) ;  b.  Myoview 10/13:  inf infarct with very mild peri-infarct ischemia, EF 49%, inf HK; c. Cath 2/25 showed 2/4 occluded grafts & high grade stenosis in the LAD distal to the LIMA insertion, not amenable to PCI - Med Rx  . Carotid stenosis     a.  dopplers 8/37: LICA 29-02%;  b. Carotid dopplers 3/14:  R 0-39%, L 60-79%, repeat 6  mos  . PAD (peripheral artery disease)     ABIs 4/12:  R 0.88, L 0.92; R SFA 40%, L CFA 50%, L SFA 50-60%  . COPD (chronic obstructive pulmonary disease)   . Inguinal hernia   . Thyroid nodule     incidental finding on carotid doppler 3/14 => thyroid U/S ordered  . Anginal pain     occ last 1-2 months ago  . Shortness of breath   . Pneumonia     hx  . GERD (gastroesophageal reflux disease)   . Myocardial infarction     PCI x3  . XJDBZMCE(022.3)    Past Surgical History  Procedure Laterality Date  . Hip surgery  40 years ago    left hip bone removal and pinning  . Abdominal aortic aneurysm repair  10-03-2009  . Exploratory laparotomy  09/2009    ligation of lumbar arteries  . Coronary artery bypass graft  06/24/2010    LIMA to the LAD, SVG to first diagonal, SVG to ramus intermediate, SVG to acute marginal. EF 50%. 2/12  . External ear surgery Left     laceration child  . Inguinal hernia repair Left 07/21/2012    Procedure: HERNIA REPAIR INGUINAL ADULT;  Surgeon: Merrie Roof, MD;  Location: [facility];  Service: General;  Laterality: Left;  . Insertion of mesh Left 07/21/2012  Procedure: INSERTION OF MESH;  Surgeon: Merrie Roof, MD;  Location: [facility];  Service: General;  Laterality: Left;  . Exploration post operative open heart    . Nm myocar perf wall motion  12/02/2010    inferoapical defect is fixed c/w prior infarction/scar, there is minimal borderzone ischemia.   Family History  Problem Relation Age of Onset  . Alcohol abuse Father   . Cancer Sister    History  Substance Use Topics  . Smoking status: Former Smoker -- 1.00 packs/day for 30 years    Types: Cigarettes    Quit date: 01/25/2010  . Smokeless tobacco: Never Used  . Alcohol Use: No     Comment: former drinker - Quit 2011.    Review of Systems  Neurological: Positive for tremors.  All other systems reviewed and are negative.     Allergies  Review of patient's allergies indicates no known  allergies.  Home Medications   Prior to Admission medications   Medication Sig Start Date End Date Taking? Authorizing Provider  aspirin EC 81 MG tablet Take 162 mg by mouth every morning.    Yes Historical Provider, MD  atorvastatin (LIPITOR) 80 MG tablet Take 80 mg by mouth every morning.    Yes Historical Provider, MD  carvedilol (COREG) 3.125 MG tablet Take 1 tablet (3.125 mg total) by mouth daily with breakfast. 10/26/13  Yes Minus Breeding, MD  clopidogrel (PLAVIX) 75 MG tablet Take 1 tablet (75 mg total) by mouth daily with breakfast. 09/23/13  Yes Langston Masker, MD  HYDROcodone-acetaminophen (NORCO) 10-325 MG per tablet Take 2 tablets by mouth every 12 (twelve) hours as needed for moderate pain.    Yes Historical Provider, MD  isosorbide mononitrate (IMDUR) 120 MG 24 hr tablet Take 1 tablet (120 mg total) by mouth daily. 07/05/13  Yes Tarri Fuller, PA-C  metoprolol tartrate (LOPRESSOR) 25 MG tablet Take 25 mg by mouth 2 (two) times daily.   Yes Historical Provider, MD  morphine (MS CONTIN) 30 MG 12 hr tablet Take 30 mg by mouth every 12 (twelve) hours.  07/07/13  Yes Historical Provider, MD  nitroGLYCERIN (NITROSTAT) 0.4 MG SL tablet Place 0.4 mg under the tongue every 5 (five) minutes as needed for chest pain.  06/01/13  Yes Minus Breeding, MD  ranolazine (RANEXA) 1000 MG SR tablet Take 1 tablet (1,000 mg total) by mouth 2 (two) times daily. 07/05/13  Yes Tarri Fuller, PA-C  tamsulosin (FLOMAX) 0.4 MG CAPS capsule Take 0.4 mg by mouth at bedtime.    Yes Historical Provider, MD   BP 120/66  Pulse 57  Temp(Src) 98 F (36.7 C) (Oral)  Resp 16  SpO2 95%  Physical Exam  Nursing note and vitals reviewed. Constitutional: He is oriented to person, place, and time. He appears well-developed and well-nourished.  HENT:  Head: Normocephalic and atraumatic.  Mouth/Throat: Oropharynx is clear and moist.  Eyes: Conjunctivae and EOM are normal. Pupils are equal, round, and reactive to light.  Neck:  Normal range of motion.  Cardiovascular: Normal rate, regular rhythm and normal heart sounds.   Pulmonary/Chest: Effort normal and breath sounds normal. No respiratory distress. He has no wheezes.  Abdominal: Soft. Bowel sounds are normal. There is no tenderness. There is no guarding.  Musculoskeletal: Normal range of motion.  Neurological: He is alert and oriented to person, place, and time. He has normal strength. He displays tremor. No cranial nerve deficit or sensory deficit. He displays no seizure activity.  AAOx3, answering  questions and following appropriately; equal strength UE and LE bilaterally; CN grossly intact; moves all extremities appropriately without ataxia but some tremors noted with movements; finger to nose is slow but no dysmetria noted; no focal neuro deficits or facial asymmetry appreciated  Skin: Skin is warm and dry.  Psychiatric: He has a normal mood and affect.    ED Course  Procedures (including critical care time) Labs Review Labs Reviewed  CBC WITH DIFFERENTIAL - Abnormal; Notable for the following:    MCV 102.3 (*)    All other components within normal limits  COMPREHENSIVE METABOLIC PANEL - Abnormal; Notable for the following:    Sodium 136 (*)    Glucose, Bld 113 (*)    Albumin 3.2 (*)    All other components within normal limits  APTT - Abnormal; Notable for the following:    aPTT 39 (*)    All other components within normal limits  PROTIME-INR  URINALYSIS, ROUTINE W REFLEX MICROSCOPIC  I-STAT TROPOININ, ED    Imaging Review Ct Head Wo Contrast  10/31/2013   CLINICAL DATA:  Dizziness, tremors.  EXAM: CT HEAD WITHOUT CONTRAST  TECHNIQUE: Contiguous axial images were obtained from the base of the skull through the vertex without intravenous contrast.  COMPARISON:  10/22/2013  FINDINGS: Old anterior inferior left frontal infarct. Old bilateral pontine infarcts, also stable. Stable mild volume loss and chronic microvascular changes. No acute infarction.  No hemorrhage or hydrocephalus. No acute calvarial abnormality.  IMPRESSION: Stable chronic changes as above.  No acute intracranial abnormality.   Electronically Signed   By: Rolm Baptise M.D.   On: 10/31/2013 19:46     EKG Interpretation   Date/Time:  Tuesday October 31 2013 18:11:14 EDT Ventricular Rate:  57 PR Interval:  166 QRS Duration: 104 QT Interval:  466 QTC Calculation: 454 R Axis:   -29 Text Interpretation:  Sinus rhythm Probable left atrial enlargement  Borderline left axis deviation Borderline T abnormalities, diffuse leads  No significant change since last tracing Confirmed by YAO  MD, DAVID  ([id number]) on 10/31/2013 7:24:43 PM      MDM   Final diagnoses:  Tremors of nervous system   63 y.o. M with hx of stroke and recurrent TIAs with two hospital admissions and work-ups in the past 3 months, presenting to the ED for increased tremors tonight while ambulating to restroom.  No numbness, weakness noted.  On arrival, sx have resolved except with mildly blurred vision.  Neurologic exam is non-focal, some tremors noted which are baseline.  Will plan for CT head, labs, EKG, troponin.  EKG sinus rhythm without ischemic changes.  Trop negative.  Labs reassuring.  CT head negative for acute findings.  On repeat evaluation, pt states vision has now returned to normal as well.  Have discussed case with on call neuro hospitalist, Dr. Armida Sans, given that sx have completely resolved at this time, has had recent admissions for TIA's with extensive work-up including multiple MRI's, carotid dopplers, etc. and negative work-up here today, feel pt can be safely discharged to FU with his neurologist.  Pt states he has upcoming appt with her next week and will call to inform her of this ED visit.  Discussed plan with patient, he/she acknowledged understanding and agreed with plan of care.  Return precautions given for new or worsening symptoms.  Discussed with Dr. Darl Householder who personally evaluated pt and  agrees with plan of care.  Larene Pickett, PA-C 10/31/13 2355

## 2013-10-31 NOTE — Discharge Instructions (Signed)
Workup today was normal. Please follow with your neurologist next week as previously scheduled. Return to the ED for any new concerns.

## 2013-11-01 NOTE — ED Provider Notes (Signed)
Medical screening examination/treatment/procedure(s) were conducted as a shared visit with non-physician practitioner(s) and myself.  I personally evaluated the patient during the encounter.   EKG Interpretation   Date/Time:  Tuesday October 31 2013 18:11:14 EDT Ventricular Rate:  57 PR Interval:  166 QRS Duration: 104 QT Interval:  466 QTC Calculation: 454 R Axis:   -29 Text Interpretation:  Sinus rhythm Probable left atrial enlargement  Borderline left axis deviation Borderline T abnormalities, diffuse leads  No significant change since last tracing Confirmed by YAO  MD, DAVID  ([id number]) on 10/31/2013 7:24:43 PM      Chris Payne is a 63 y.o. male hx of recent TIA, CAD here with tremors. Has baseline tremors. Walked to bathroom and tremors was worse. Some blurry vision that resolved. Has recent admissions for TIA workup. Chronically ill, NAD. Heart, lung, abdomen exam unremarkable. CN2-12 intact. Nl strength throughout. Some tremors with finger to nose but no dysmetria. CT head showed no acute stroke. Labs and EKG and trop neg x1. PA called Dr. Aram Beecham, who agrees that with recent workup for TIA and no focal deficits currently, will not need admission for TIA workup. Stable for d/c.    Wandra Arthurs, MD 11/01/13 2122

## 2013-11-06 NOTE — Telephone Encounter (Signed)
I called the number given, but not correct. I did fax some orders for this service to [facility], so hopefully this is what is needed.

## 2013-11-10 ENCOUNTER — Ambulatory Visit (INDEPENDENT_AMBULATORY_CARE_PROVIDER_SITE_OTHER): Payer: Commercial Managed Care - HMO | Admitting: Neurology

## 2013-11-10 ENCOUNTER — Encounter (INDEPENDENT_AMBULATORY_CARE_PROVIDER_SITE_OTHER): Payer: Self-pay

## 2013-11-10 ENCOUNTER — Ambulatory Visit: Payer: Medicare HMO | Admitting: Cardiology

## 2013-11-10 DIAGNOSIS — M545 Low back pain, unspecified: Secondary | ICD-10-CM

## 2013-11-10 DIAGNOSIS — R269 Unspecified abnormalities of gait and mobility: Secondary | ICD-10-CM

## 2013-11-10 DIAGNOSIS — R5381 Other malaise: Secondary | ICD-10-CM

## 2013-11-10 DIAGNOSIS — R5383 Other fatigue: Secondary | ICD-10-CM

## 2013-11-10 DIAGNOSIS — R531 Weakness: Secondary | ICD-10-CM

## 2013-11-10 DIAGNOSIS — Z0289 Encounter for other administrative examinations: Secondary | ICD-10-CM

## 2013-11-10 NOTE — Procedures (Signed)
   NCS (NERVE CONDUCTION STUDY) WITH EMG (ELECTROMYOGRAPHY) REPORT   STUDY DATE: July 17th 2015 PATIENT NAME: Chris Payne DOB: October 09, 1950 MRN: [id number]    TECHNOLOGIST: Laretta Alstrom ELECTROMYOGRAPHER: Marcial Pacas M.D.  CLINICAL INFORMATION:  63 years old male, with past medical history of left hip replacement more than 40 years ago, presenting with worsening low back pain, gait difficulty,  FINDINGS: NERVE CONDUCTION STUDY:  Bilateral peroneal sensory responses were normal. Bilateral tibial motor responses were normal. Right peroneal EDB motor responses were normal. Left peroneal EDB borderers pounds showed mildly decreased CMAP amplitude, with normal distal latency, conduction velocity. Bilateral tibial H. reflexes were normal and symmetric  NEEDLE ELECTROMYOGRAPHY: Selected needle examination was performed at bilateral lower extremity muscles, bilateral lumbosacral paraspinal muscles  Bilateral tibialis anterior, tibialis posterior, increased insertion activity, no spontaneous activity, simple morphology motor unit potential, enlarged, with mildly decreased recruitment patterns.  Needle examination of bilateral vastus lateralis, right biceps femoris short head was normal.  There was increased insertion activity, poor relaxation of bilateral lumbosacral paraspinal muscles, but there was no evidence of active denervation at bilateral L4 L5-S1.   IMPRESSION:  This is a slight abnormal study, there was no evidence of large fiber peripheral neuropathy, slight chronic neuropathic changes involving bilateral lumbar myotomes, indicating a mild chronic lumbar radiculopathy, there was no evidence of active process, the mild abnormality would certainly not explaine his profound gait difficulty    INTERPRETING PHYSICIAN:   Marcial Pacas M.D. Ph.D. [facility] [address], [place], [place] [address] [phone]

## 2013-11-10 NOTE — Progress Notes (Signed)
PATIENT: Chris Payne DOB: 04-25-1951  HISTORICAL  Chris Payne is a 63 years old right-handed Caucasian male, overall at visit, he is referred  by his primary care physician Dr. Dolores Frame for evaluation of gait difficulty, worsening low back pain  He had a past medical history of coronary artery disease, status post stent, also had a history of left hip replacement about 40 years ago, in his twenties, he has no significant left hip pain, but has limited range of motion of left leg, he also had a history of aortic aneurysm, status post repair, hypertension, peripheral vascular disease, COPD, stroke, involving the left side of his body,  He complains of chronic low back pain, worsened since 2012, his low back pain staying at his midline low back, he denies significant radiating pain to his lower extremity, it is hard for him to stretch out his back,  He always walked with a limp since his left hip replacement, now he walks bending down his back, significant low back pain, he denies bowel and bladder incontinence,  UPDATE July 17th 2015:  He is accompanied by his brother-in-law at today's clinical visit, since her last visit in May 2013, he was admitted to the hospital in May 20 ninth for acute onset left facial droop, CT of the head showed no acute lesions, evidence of periventricular white matter disease  CT angiogram showed chronic soft plaque at both ICA origins, greater on the left with some chronic ulceration. Left ICA bulb stenosis now up to 55 % with  respect to the distal vessel. Right ICA bulb stenosis remains less than 50%.  Chronic severe left vertebral artery atherosclerosis, with only trickle flow present in that vessel throughout much of the neck.  Suspect short segment full occlusion of the left vertebral just below the skullbase, with poorly reconstituted flow from an upper cervical muscular branch at C1. The appearance is stable since 2012. The left vertebral appears  occluded intracranially, congruent with  the recent MRA. Right vertebral artery remains patent without stenosis.   He was admitted hospital again in October 23 2013, for atypical chest pain, cardiac enzyme was negative, evidence of bradycardia,  Presented to the emergency room in July 2015 for tremor, worsening gait difficulty,  I have reviewed MRI lumbar in May 2015: prominent spondylitic changes throughout most severe at L5 -S1 for the severe right and moderate left-sided foraminal narrowing and at L4-5 there is moderate left-sided foraminal narrowing  MRI cervical showed mild spondylytic changes from C3-C6 but without significant compression  MRI of thoracic spine showed no significant canal, or foraminal stenosis.  He returns for electrodiagnostic study today, which has demonstrated slight chronic neuropathic changes at bilateral lumbosacral myotomes, there was no evidence of active process, no evidence of large fiber peripheral neuropathy, no evidence of myopathy   REVIEW OF SYSTEMS: Full 14 system review of systems performed and notable only for abdominal pain, low back pain, gait difficulty, generalized weakness  ALLERGIES: No Known Allergies  HOME MEDICATIONS: Current Outpatient Prescriptions on File Prior to Visit  Medication Sig Dispense Refill  . aspirin EC 81 MG tablet Take 162 mg by mouth every morning.       Marland Kitchen atorvastatin (LIPITOR) 80 MG tablet Take 80 mg by mouth every morning.       . carvedilol (COREG) 3.125 MG tablet Take 1 tablet (3.125 mg total) by mouth daily with breakfast.  30 tablet  1  . clopidogrel (PLAVIX) 75 MG tablet Take 1  tablet (75 mg total) by mouth daily with breakfast.  30 tablet  0  . HYDROcodone-acetaminophen (NORCO) 10-325 MG per tablet Take 2 tablets by mouth every 12 (twelve) hours as needed for moderate pain.       . isosorbide mononitrate (IMDUR) 120 MG 24 hr tablet Take 1 tablet (120 mg total) by mouth daily.  30 tablet  5  . metoprolol tartrate  (LOPRESSOR) 25 MG tablet Take 25 mg by mouth 2 (two) times daily.      Marland Kitchen morphine (MS CONTIN) 30 MG 12 hr tablet Take 30 mg by mouth every 12 (twelve) hours.       . nitroGLYCERIN (NITROSTAT) 0.4 MG SL tablet Place 0.4 mg under the tongue every 5 (five) minutes as needed for chest pain.       . ranolazine (RANEXA) 1000 MG SR tablet Take 1 tablet (1,000 mg total) by mouth 2 (two) times daily.  60 tablet  5  . tamsulosin (FLOMAX) 0.4 MG CAPS capsule Take 0.4 mg by mouth at bedtime.        No current facility-administered medications on file prior to visit.    PAST MEDICAL HISTORY: Past Medical History  Diagnosis Date  . Hypertension   . Stroke     h/o pontine CVA  . AAA (abdominal aortic aneurysm)     s/p repair 6/11  . Hyperlipidemia   . CAD (coronary artery disease)     s/p CABG 2/12:  LIMA to LAD, SVG to diagonal-1, SVG to ramus intermedius,, SVG to AM (Dr. Roxan Hockey) ;  b.  Myoview 10/13:  inf infarct with very mild peri-infarct ischemia, EF 49%, inf HK; c. Cath 2/25 showed 2/4 occluded grafts & high grade stenosis in the LAD distal to the LIMA insertion, not amenable to PCI - Med Rx  . Carotid stenosis     a.  dopplers 4/26: LICA 83-41%;  b. Carotid dopplers 3/14:  R 0-39%, L 60-79%, repeat 6 mos  . PAD (peripheral artery disease)     ABIs 4/12:  R 0.88, L 0.92; R SFA 40%, L CFA 50%, L SFA 50-60%  . COPD (chronic obstructive pulmonary disease)   . Inguinal hernia   . Thyroid nodule     incidental finding on carotid doppler 3/14 => thyroid U/S ordered  . Anginal pain     occ last 1-2 months ago  . Shortness of breath   . Pneumonia     hx  . GERD (gastroesophageal reflux disease)   . Myocardial infarction     PCI x3  . Headache(784.0)     PAST SURGICAL HISTORY: Past Surgical History  Procedure Laterality Date  . Hip surgery  40 years ago    left hip bone removal and pinning  . Abdominal aortic aneurysm repair  10-03-2009  . Exploratory laparotomy  09/2009    ligation of  lumbar arteries  . Coronary artery bypass graft  06/24/2010    LIMA to the LAD, SVG to first diagonal, SVG to ramus intermediate, SVG to acute marginal. EF 50%. 2/12  . External ear surgery Left     laceration child  . Inguinal hernia repair Left 07/21/2012    Procedure: HERNIA REPAIR INGUINAL ADULT;  Surgeon: Merrie Roof, MD;  Location: [facility];  Service: General;  Laterality: Left;  . Insertion of mesh Left 07/21/2012    Procedure: INSERTION OF MESH;  Surgeon: Merrie Roof, MD;  Location: [facility];  Service: General;  Laterality: Left;  .  Exploration post operative open heart    . Nm myocar perf wall motion  12/02/2010    inferoapical defect is fixed c/w prior infarction/scar, there is minimal borderzone ischemia.    FAMILY HISTORY: Family History  Problem Relation Age of Onset  . Alcohol abuse Father   . Cancer Sister     SOCIAL HISTORY:  History   Social History  . Marital Status: Married    Spouse Name: Rod Holler    Number of Children: 0  . Years of Education: 10   Occupational History  .      Retired   Social History Main Topics  . Smoking status: Former Smoker -- 1.00 packs/day for 30 years    Types: Cigarettes    Quit date: 01/25/2010  . Smokeless tobacco: Never Used  . Alcohol Use: No     Comment: former drinker - Quit 2011.  . Drug Use: No  . Sexual Activity: No   Other Topics Concern  . Not on file   Social History Narrative   Retired Journalist, newspaper.  Lives with wife Rod Holler) and stepson. Married x 13 years.   Education 10th grade.   Right handed.   Caffeine five cups of coffee daily.     PHYSICAL EXAM   There were no vitals filed for this visit.  Not recorded    There is no weight on file to calculate BMI.   Generalized: In no acute distress  Neck: Supple, no carotid bruits   Cardiac: Regular rate rhythm  Pulmonary: Clear to auscultation bilaterally  Musculoskeletal: No deformity  Neurological examination  Mentation: Alert oriented to  time, place, history taking, and causual conversation  Cranial nerve II-XII: Pupils were equal round reactive to light. Extraocular movements were full.  Visual field were full on confrontational test. Bilateral fundi were sharp.  Facial sensation and strength were normal. Hearing was intact to finger rubbing bilaterally. Uvula tongue midline.  Head turning and shoulder shrug and were normal and symmetric.Tongue protrusion into cheek strength was normal.  Motor: He has no significant bilateral upper extremity weakness, he has mild bilateral lower extremity spasticity, he had variable effort on examination, limited range of motion of left hip, slight left hip flexion, ankle flexion weakness,  Sensory: Length dependent decreased fine touch, pinprick to mid shin level,  Coordination: Normal finger to nose, heel-to-shin bilaterally there was no truncal ataxia  Gait: Need assessment to get up from seated position, dragging his left leg, stiff, unsteady, forward bending posture  Romberg signs: Negative  Deep tendon reflexes: Brachioradialis 2/2, biceps 2/2, triceps 2/2, patellar 33, Achilles 3/3,  plantar responses were extensor bilaterally.   DIAGNOSTIC DATA (LABS, IMAGING, TESTING) - I reviewed patient records, labs, notes, testing and imaging myself where available.  Lab Results  Component Value Date   WBC 7.8 10/31/2013   HGB 14.4 10/31/2013   HCT 43.7 10/31/2013   MCV 102.3* 10/31/2013   PLT 213 10/31/2013      Component Value Date/Time   NA 136* 10/31/2013 1846   K 4.2 10/31/2013 1846   CL 97 10/31/2013 1846   CO2 26 10/31/2013 1846   GLUCOSE 113* 10/31/2013 1846   BUN 20 10/31/2013 1846   CREATININE 0.82 10/31/2013 1846   CREATININE 0.86 08/23/2013 1736   CALCIUM 9.1 10/31/2013 1846   PROT 6.9 10/31/2013 1846   ALBUMIN 3.2* 10/31/2013 1846   AST 16 10/31/2013 1846   ALT 10 10/31/2013 1846   ALKPHOS 80 10/31/2013 1846   BILITOT 0.6  10/31/2013 1846   GFRNONAA >90 10/31/2013 1846   GFRAA >90 10/31/2013 1846   Lab  Results  Component Value Date   CHOL 188 07/04/2013   HDL 43 07/04/2013   LDLCALC 129* 07/04/2013   TRIG 81 07/04/2013   CHOLHDL 4.4 07/04/2013   Lab Results  Component Value Date   HGBA1C 5.4 08/10/2011   Lab Results  Component Value Date   VITAMINB12 629 08/24/2013   Lab Results  Component Value Date   TSH 1.680 08/24/2013      ASSESSMENT AND PLAN  Chris Payne is a 63 y.o. male with past medical history of left hip replacement, now complains of worsening low back pain, worsening gait difficulties, on examinations, he has length dependent sensory changes, hyperreflexia of both upper and lower extremities, there was no significant structural lesion at extensive MRIs involving neuraxis, to explain his hyperreflexia, only slight chronic neuropathic changes at bilateral lumbosacral myotomes, by electrodiagnostic studies,  His gait difficulty is multifactor including deconditioning from his lifelong condition, cerebral palsy, limitation from pain, Continue to moderate exercise Return to clinic in 6-9 month  Marcial Pacas, M.D. Ph.D.  [facility] [address], [place], [place] [address] [phone]

## 2013-11-17 ENCOUNTER — Ambulatory Visit: Payer: Commercial Managed Care - HMO | Admitting: Family Medicine

## 2013-11-23 ENCOUNTER — Encounter: Payer: Self-pay | Admitting: Family Medicine

## 2013-11-23 ENCOUNTER — Ambulatory Visit (INDEPENDENT_AMBULATORY_CARE_PROVIDER_SITE_OTHER): Payer: Commercial Managed Care - HMO | Admitting: Family Medicine

## 2013-11-23 VITALS — BP 90/56 | HR 61 | Temp 97.6°F | Resp 16 | Ht 64.0 in | Wt 140.2 lb

## 2013-11-23 DIAGNOSIS — Z1159 Encounter for screening for other viral diseases: Secondary | ICD-10-CM

## 2013-11-23 DIAGNOSIS — R7989 Other specified abnormal findings of blood chemistry: Secondary | ICD-10-CM

## 2013-11-23 DIAGNOSIS — R29898 Other symptoms and signs involving the musculoskeletal system: Secondary | ICD-10-CM

## 2013-11-23 DIAGNOSIS — M25579 Pain in unspecified ankle and joints of unspecified foot: Secondary | ICD-10-CM

## 2013-11-23 DIAGNOSIS — M519 Unspecified thoracic, thoracolumbar and lumbosacral intervertebral disc disorder: Secondary | ICD-10-CM

## 2013-11-23 DIAGNOSIS — Z131 Encounter for screening for diabetes mellitus: Secondary | ICD-10-CM

## 2013-11-23 LAB — URIC ACID: Uric Acid, Serum: 4.4 mg/dL (ref 4.0–7.8)

## 2013-11-23 LAB — POCT GLYCOSYLATED HEMOGLOBIN (HGB A1C): Hemoglobin A1C: 5.1

## 2013-11-23 MED ORDER — GABAPENTIN 100 MG PO CAPS: ORAL_CAPSULE | ORAL | Status: DC

## 2013-11-23 NOTE — Progress Notes (Signed)
Subjective:    Patient ID: Chris Payne, male    DOB: [birth year], 63 y.o.   MRN: [id number]  HPI  This 63 y.o. Cauc male returns for follow-up after hospitalization on 10/22/2013 for evaluation of chest pain. Pt has significant CAD, PAD, hx of AAA repair and hx of pontine CVA. Pt reports that he may have been having mini-strokes. He was recently seen by Dr. Krista Blue for eval of worsening back pain. Pt states he is getting injections w/ a pain specialist. Dr. Krista Blue felt that gait difficulty is multi-factorial including deconditioning from lifelong condition (cerebral palsy) and limitation from pain.  Pt continues to have low blood pressure but does not c/o dizziness, lightheadedness or syncope. He reports good appetite (2 meals daily with an evening snack); he was not aware of weight loss. He has persistent ankle and lower extremity edema; not elevating legs during the day. Pt ambulates w/ a walker; he has no recent falls but leg weakness persists. He requests in-home PT to help improve his gait. His goal is to "get off this walker".  Chronic pain - lumbar spine, legs and feet and ankles. Has pain medications per specialist. Pt inquires if all the medications he is taking are necessary; "I would like to get off a few of these medications if I can".  Pt requests to restart Gabapentin; it was effective for pain.  Patient Active Problem List   Diagnosis Date Noted  . Atypical angina 10/22/2013  . Low back pain 09/05/2013  . TIA (transient ischemic attack) 08/23/2013  . Other and unspecified angina pectoris 07/04/2013  . Nontoxic multinodular goiter 10/07/2012  . Unstable angina 06/19/2012  . Carotid stenosis   . PAD (peripheral artery disease)   . Left inguinal hernia 05/30/2012  . COPD (chronic obstructive pulmonary disease) 08/24/2011  . S/P AAA repair 08/24/2011  . Lumbar disc disease   . Hypertensive heart disease   . Hyperlipidemia   . CAD (coronary artery disease)   . History of pontine  CVA   . GERD (gastroesophageal reflux disease)     PMHx, Surg Hx, Soc and Fam Hx reviewed.  MEDICATIONS reconciled.   Review of Systems  Constitutional: Positive for fatigue and unexpected weight change. Negative for fever, diaphoresis and appetite change.  HENT: Negative for trouble swallowing.   Eyes: Negative.   Respiratory: Negative.   Cardiovascular: Positive for leg swelling.  Gastrointestinal: Negative.   Musculoskeletal: Positive for back pain and gait problem. Negative for myalgias.  Neurological: Positive for weakness.  Psychiatric/Behavioral: Positive for sleep disturbance, self-injury and dysphoric mood. The patient is nervous/anxious.        Objective:   Physical Exam  Nursing note and vitals reviewed. Constitutional: He is oriented to person, place, and time. He appears well-developed. He does not have a sickly appearance. No distress.  HENT:  Head: Normocephalic and atraumatic.  Right Ear: External ear normal.  Left Ear: External ear normal.  Nose: Nose normal.  Mouth/Throat: Oropharynx is clear and moist.  Eyes: Conjunctivae and EOM are normal. Pupils are equal, round, and reactive to light.  Cardiovascular: Normal rate.   Pulmonary/Chest: Effort normal. No respiratory distress.  Musculoskeletal: He exhibits edema.  Neurological: He is alert and oriented to person, place, and time. No cranial nerve deficit. He exhibits normal muscle tone.  Skin: Skin is warm and dry. He is not diaphoretic. There is erythema.  Psychiatric: His speech is normal and behavior is normal. Thought content normal. His mood appears not anxious.  His affect is not inappropriate. He exhibits a depressed mood.    Results for orders placed in visit on 11/23/13  POCT GLYCOSYLATED HEMOGLOBIN (HGB A1C)      Result Value Ref Range   Hemoglobin A1C 5.1        Assessment & Plan:  Abnormal CBC measurement - CBC (10/29/13) w/  Normal Hgb/ Hct and WBC; MCV= 102.3;  MCV= 105.5 in late June 2015.   Plan: Vitamin B12  Screening for diabetes mellitus - Plan: POCT glycosylated hemoglobin (Hb A1C)  Lumbar disc disease - Follow-up w/ specialist; Dr. Krista Blue to see pt in 6-9 months per OV note.    Plan: Ambulatory referral to Home Health  Bilateral leg weakness - Plan: Ambulatory referral to Home Health  Pain in joint, ankle and foot, unspecified laterality - Plan: Uric Acid, Hepatitis C antibody  Need for hepatitis C screening test  Meds ordered this encounter  Medications  . gabapentin (NEURONTIN) 100 MG capsule    Sig: Take 1 capsule every morning and 2 capsules at bedtime as directed.    Dispense:  90 capsule    Refill:  3

## 2013-11-23 NOTE — Patient Instructions (Signed)
I have ordered in home physical therapy and nutrition consult. Someone should be in touch with you next week to do an in-home assessment. You need to be sure you are at home for this visit.  I will see you in 3 months; I will be in touch about lab results.

## 2013-11-24 LAB — HEPATITIS C ANTIBODY: HCV Ab: NEGATIVE

## 2013-11-24 LAB — VITAMIN B12: VITAMIN B 12: 395 pg/mL (ref 211–911)

## 2013-11-26 ENCOUNTER — Other Ambulatory Visit: Payer: Self-pay | Admitting: Family Medicine

## 2013-11-26 MED ORDER — B COMPLEX-VITAMIN B12 PO TABS: 1.0000 | ORAL_TABLET | Freq: Every day | ORAL | Status: DC

## 2013-11-26 NOTE — Progress Notes (Signed)
Quick Note:  Please advise pt regarding following labs... All labs are normal but Vitamin B12 level is low; it has steadily declined over last 2 years. I am prescribing a multivitamin with Vitamin B12. Take this supplement daily. Lab will be rechecked in near future. If this level does not improve, we may need to consider Vitamin B12 injections. Continue all other medications.  Contact the clinic if you have questions or concerns.  Copy to pt. ______

## 2013-11-28 ENCOUNTER — Encounter: Payer: Self-pay | Admitting: Family Medicine

## 2013-11-28 ENCOUNTER — Encounter: Payer: Self-pay | Admitting: Cardiology

## 2013-11-28 ENCOUNTER — Ambulatory Visit (INDEPENDENT_AMBULATORY_CARE_PROVIDER_SITE_OTHER): Payer: Commercial Managed Care - HMO | Admitting: Cardiology

## 2013-11-28 VITALS — BP 121/61 | HR 55 | Ht 67.0 in | Wt 159.5 lb

## 2013-11-28 DIAGNOSIS — I25701 Atherosclerosis of coronary artery bypass graft(s), unspecified, with angina pectoris with documented spasm: Secondary | ICD-10-CM

## 2013-11-28 DIAGNOSIS — Z9889 Other specified postprocedural states: Secondary | ICD-10-CM

## 2013-11-28 DIAGNOSIS — I25810 Atherosclerosis of coronary artery bypass graft(s) without angina pectoris: Secondary | ICD-10-CM

## 2013-11-28 DIAGNOSIS — I2510 Atherosclerotic heart disease of native coronary artery without angina pectoris: Secondary | ICD-10-CM

## 2013-11-28 DIAGNOSIS — Z8679 Personal history of other diseases of the circulatory system: Secondary | ICD-10-CM

## 2013-11-28 DIAGNOSIS — I25119 Atherosclerotic heart disease of native coronary artery with unspecified angina pectoris: Secondary | ICD-10-CM

## 2013-11-28 DIAGNOSIS — I209 Angina pectoris, unspecified: Secondary | ICD-10-CM

## 2013-11-28 DIAGNOSIS — E785 Hyperlipidemia, unspecified: Secondary | ICD-10-CM

## 2013-11-28 DIAGNOSIS — G459 Transient cerebral ischemic attack, unspecified: Secondary | ICD-10-CM

## 2013-11-28 NOTE — Patient Instructions (Addendum)
Your physician recommends that you schedule a follow-up appointment in: [place]   ELEVATE LEGS WHILE SITTING  Low-Sodium Eating Plan Sodium raises blood pressure and causes water to be held in the body. Getting less sodium from food will help lower your blood pressure, reduce any swelling, and protect your heart, liver, and kidneys. We get sodium by adding salt (sodium chloride) to food. Most of our sodium comes from canned, boxed, and frozen foods. Restaurant foods, fast foods, and pizza are also very high in sodium. Even if you take medicine to lower your blood pressure or to reduce fluid in your body, getting less sodium from your food is important. WHAT IS MY PLAN? Most people should limit their sodium intake to 2,300 mg a day. Your health care provider recommends that you limit your sodium intake to __________ a day.  WHAT DO I NEED TO KNOW ABOUT THIS EATING PLAN? For the low-sodium eating plan, you will follow these general guidelines:  Choose foods with a % Daily Value for sodium of less than 5% (as listed on the food label).   Use salt-free seasonings or herbs instead of table salt or sea salt.   Check with your health care provider or pharmacist before using salt substitutes.   Eat fresh foods.  Eat more vegetables and fruits.  Limit canned vegetables. If you do use them, rinse them well to decrease the sodium.   Limit cheese to 1 oz (28 g) per day.   Eat lower-sodium products, often labeled as "lower sodium" or "no salt added."  Avoid foods that contain monosodium glutamate (MSG). MSG is sometimes added to [place] food and some canned foods.  Check food labels (Nutrition Facts labels) on foods to learn how much sodium is in one serving.  Eat more home-cooked food and less restaurant, buffet, and fast food.  When eating at a restaurant, ask that your food be prepared with less salt or none, if possible.  HOW DO I READ FOOD LABELS FOR SODIUM  INFORMATION? The Nutrition Facts label lists the amount of sodium in one serving of the food. If you eat more than one serving, you must multiply the listed amount of sodium by the number of servings. Food labels may also identify foods as:  Sodium free--Less than 5 mg in a serving.  Very low sodium--35 mg or less in a serving.  Low sodium--140 mg or less in a serving.  Light in sodium--50% less sodium in a serving. For example, if a food that usually has 300 mg of sodium is changed to become light in sodium, it will have 150 mg of sodium.  Reduced sodium--25% less sodium in a serving. For example, if a food that usually has 400 mg of sodium is changed to reduced sodium, it will have 300 mg of sodium. WHAT FOODS CAN I EAT? Grains Low-sodium cereals, including oats, puffed wheat and rice, and shredded wheat cereals. Low-sodium crackers. Unsalted rice and pasta. Lower-sodium bread.  Vegetables Frozen or fresh vegetables. Low-sodium or reduced-sodium canned vegetables. Low-sodium or reduced-sodium tomato sauce and paste. Low-sodium or reduced-sodium tomato and vegetable juices.  Fruits Fresh, frozen, and canned fruit. Fruit juice.  Meat and Other Protein Products Low-sodium canned tuna and salmon. Fresh or frozen meat, poultry, seafood, and fish. Lamb. Unsalted nuts. Dried beans, peas, and lentils without added salt. Unsalted canned beans. Homemade soups without salt. Eggs.  Dairy Milk. Soy milk. Ricotta cheese. Low-sodium or reduced-sodium cheeses. Yogurt.  Condiments Fresh and  dried herbs and spices. Salt-free seasonings. Onion and garlic powders. Low-sodium varieties of mustard and ketchup. Lemon juice.  Fats and Oils Reduced-sodium salad dressings. Unsalted butter.  Other Unsalted popcorn and pretzels.  The items listed above may not be a complete list of recommended foods or beverages. Contact your dietitian for more options. WHAT FOODS ARE NOT  RECOMMENDED? Grains Instant hot cereals. Bread stuffing, pancake, and biscuit mixes. Croutons. Seasoned rice or pasta mixes. Noodle soup cups. Boxed or frozen macaroni and cheese. Self-rising flour. Regular salted crackers. Vegetables Regular canned vegetables. Regular canned tomato sauce and paste. Regular tomato and vegetable juices. Frozen vegetables in sauces. Salted french fries. Olives. Angie Fava. Relishes. Sauerkraut. Salsa. Meat and Other Protein Products Salted, canned, smoked, spiced, or pickled meats, seafood, or fish. Bacon, ham, sausage, hot dogs, corned beef, chipped beef, and packaged luncheon meats. Salt pork. Jerky. Pickled herring. Anchovies, regular canned tuna, and sardines. Salted nuts. Dairy Processed cheese and cheese spreads. Cheese curds. Blue cheese and cottage cheese. Buttermilk.  Condiments Onion and garlic salt, seasoned salt, table salt, and sea salt. Canned and packaged gravies. Worcestershire sauce. Tartar sauce. Barbecue sauce. Teriyaki sauce. Soy sauce, including reduced sodium. Steak sauce. Fish sauce. Oyster sauce. Cocktail sauce. Horseradish. Regular ketchup and mustard. Meat flavorings and tenderizers. Bouillon cubes. Hot sauce. Tabasco sauce. Marinades. Taco seasonings. Relishes. Fats and Oils Regular salad dressings. Salted butter. Margarine. Ghee. Bacon fat.  Other Potato and tortilla chips. Corn chips and puffs. Salted popcorn and pretzels. Canned or dried soups. Pizza. Frozen entrees and pot pies.  The items listed above may not be a complete list of foods and beverages to avoid. Contact your dietitian for more information. Document Released: 10/03/2001 Document Revised: 04/18/2013 Document Reviewed: 02/15/2013 [facility] Patient Information 2015 [place], [place]. This information is not intended to replace advice given to you by your health care provider. Make sure you discuss any questions you have with your health care provider.

## 2013-11-28 NOTE — Progress Notes (Signed)
12/03/2013   PCP: Ellsworth Lennox, MD   Chief Complaint  Patient presents with  . Follow-up    post hospital    Primary Cardiologist:Dr. Vita Barley   HPI:  Chris Payne is a 63 y.o. male with a hx of CAD s/p CABG in 05/2010, AAA s/p repair 6/11, prior stroke, carotid stenosis, PAD, HTN, HL, COPD.   he presents today after hospitalization with atypical chest pain undergoing nuclear stress test which was negative for ischemia.    He underwent LHC 06/2013 for an admit for chest pain.  It demonstrated an occluded vein graft to the diagonal and an occluded vein graft to the acute marginal of the RCA. LIMA-LAD remained patent with distal disease beyond graft insertion. SVG-RI was also patent. His anatomy was unchanged from prior catheterization in 05/2012. Continued medical therapy was recommended.  He also had a TIA in 08/2013.    He was seen in ER 10/31/13 for weakness and tremors.  CT of the head was negative for acute findings.    Today he admits to an occasional mitral) for chest pain with relief no shortness of breath. He does have physical therapy- home health and is being followed by his primary care physician.        No Known Allergies  Current Outpatient Prescriptions  Medication Sig Dispense Refill  . aspirin EC 81 MG tablet Take 162 mg by mouth every morning.       Marland Kitchen atorvastatin (LIPITOR) 80 MG tablet Take 80 mg by mouth every morning.       . B Complex-Folic Acid (B COMPLEX-VITAMIN B12) TABS Take 1 tablet by mouth daily.  30 tablet  5  . carvedilol (COREG) 3.125 MG tablet Take 1 tablet (3.125 mg total) by mouth daily with breakfast.  30 tablet  1  . clopidogrel (PLAVIX) 75 MG tablet Take 1 tablet (75 mg total) by mouth daily with breakfast.  30 tablet  0  . gabapentin (NEURONTIN) 100 MG capsule Take 1 capsule every morning and 2 capsules at bedtime as directed.  90 capsule  3  . HYDROcodone-acetaminophen (NORCO) 10-325 MG per tablet Take 2 tablets by  mouth every 12 (twelve) hours as needed for moderate pain.       . isosorbide mononitrate (IMDUR) 120 MG 24 hr tablet Take 1 tablet (120 mg total) by mouth daily.  30 tablet  5  . metoprolol tartrate (LOPRESSOR) 25 MG tablet Take 25 mg by mouth 2 (two) times daily.      Marland Kitchen morphine (MS CONTIN) 30 MG 12 hr tablet Take 30 mg by mouth every 12 (twelve) hours.       . nitroGLYCERIN (NITROSTAT) 0.4 MG SL tablet Place 0.4 mg under the tongue every 5 (five) minutes as needed for chest pain.       . ranolazine (RANEXA) 1000 MG SR tablet Take 1 tablet (1,000 mg total) by mouth 2 (two) times daily.  60 tablet  5  . tamsulosin (FLOMAX) 0.4 MG CAPS capsule Take 0.4 mg by mouth at bedtime.        No current facility-administered medications for this visit.    Past Medical History  Diagnosis Date  . Hypertension   . Stroke     h/o pontine CVA  . AAA (abdominal aortic aneurysm)     s/p repair 6/11  . Hyperlipidemia   . CAD (coronary artery disease)     s/p CABG 2/12:  LIMA to  LAD, SVG to diagonal-1, SVG to ramus intermedius,, SVG to AM (Dr. Roxan Hockey) ;  b.  Myoview 10/13:  inf infarct with very mild peri-infarct ischemia, EF 49%, inf HK; c. Cath 2/25 showed 2/4 occluded grafts & high grade stenosis in the LAD distal to the LIMA insertion, not amenable to PCI - Med Rx  . Carotid stenosis     a.  dopplers 2/83: LICA 15-17%;  b. Carotid dopplers 3/14:  R 0-39%, L 60-79%, repeat 6 mos  . PAD (peripheral artery disease)     ABIs 4/12:  R 0.88, L 0.92; R SFA 40%, L CFA 50%, L SFA 50-60%  . COPD (chronic obstructive pulmonary disease)   . Inguinal hernia   . Thyroid nodule     incidental finding on carotid doppler 3/14 => thyroid U/S ordered  . Anginal pain     occ last 1-2 months ago  . Shortness of breath   . Pneumonia     hx  . GERD (gastroesophageal reflux disease)   . Myocardial infarction     PCI x3  . OHYWVPXT(062.6)     Past Surgical History  Procedure Laterality Date  . Hip surgery  40  years ago    left hip bone removal and pinning  . Abdominal aortic aneurysm repair  10-03-2009  . Exploratory laparotomy  09/2009    ligation of lumbar arteries  . Coronary artery bypass graft  06/24/2010    LIMA to the LAD, SVG to first diagonal, SVG to ramus intermediate, SVG to acute marginal. EF 50%. 2/12  . External ear surgery Left     laceration child  . Inguinal hernia repair Left 07/21/2012    Procedure: HERNIA REPAIR INGUINAL ADULT;  Surgeon: Merrie Roof, MD;  Location: [facility];  Service: General;  Laterality: Left;  . Insertion of mesh Left 07/21/2012    Procedure: INSERTION OF MESH;  Surgeon: Merrie Roof, MD;  Location: [facility];  Service: General;  Laterality: Left;  . Exploration post operative open heart    . Nm myocar perf wall motion  12/02/2010    inferoapical defect is fixed c/w prior infarction/scar, there is minimal borderzone ischemia.    RSW:NIOEVOJ:JK colds or fevers, + weight change with increase, admits to high salty food. Skin:no rashes or ulcers HEENT:no blurred vision, no congestion CV:see HPI PUL:see HPI GI:no diarrhea constipation or melena, no indigestion GU:no hematuria, no dysuria MS:no joint pain, no claudication, + back pain Neuro:no syncope, no lightheadedness Endo:no diabetes, no thyroid disease  Wt Readings from Last 3 Encounters:  11/28/13 159 lb 8 oz (72.349 kg)  11/23/13 140 lb 3.2 oz (63.594 kg)  10/23/13 164 lb 10.9 oz (74.7 kg)    PHYSICAL EXAM BP 121/61  Pulse 55  Ht 5\' 7"  (1.702 m)  Wt 159 lb 8 oz (72.349 kg)  BMI 24.98 kg/m2 General:Pleasant affect, NAD Skin:Warm and dry, brisk capillary refill HEENT:normocephalic, sclera clear, mucus membranes moist Neck:supple, no JVD, no bruits  Heart:S1S2 RRR without murmur, gallup, rub or click Lungs:clear without rales, rhonchi, or wheezes KXF:GHWE, non tender, + BS, do not palpate liver spleen or masses Ext:1+ lower ext edema, 1+ pedal pulses, 2+ radial pulses Neuro:alert and oriented,  MAE, follows commands, + facial symmetry  No EKG this visit, I did review the one from the emergency room August 2 with out acute changes.  ASSESSMENT AND PLAN CAD (coronary artery disease) Cath 05/2010 80%ostial left main, 60% LAD, 80% diag, occluded RCA CABG  06/24/10 with  LIMA to LAD, saphenous vein graft to first diagonal, saphenous vein graft to ramus intermedius, saphenous vein graft to acute marginal  ( circ and posterolateral branch of RCA too small to graft)  Cath 7/94/801: LV systolic function- mild inferior hypokinesis. LVEF ~ 55%; no significant mitral regurg. Severe native vessel disease w/ 2/4 grafts occluded. High grade stenosis in LAD distal to LIMA insertion; not amenable to PCI.   Cath 06/2013 demonstrated an occluded vein graft to the diagonal and an occluded vein graft to the acute marginal of the RCA. LIMA-LAD remained patent with distal disease beyond graft insertion. SVG-RI was also patent. His anatomy was unchanged from prior catheterization in 05/2012.  Nuclear stress test 10/23/2013 with new areas of ischemia EF 58%.   Currently stable he uses nitroglycerin on a when necessary basis not frequently.  He will follow with Dr. Vita Barley  In 2- 3 months   CAD (coronary artery disease) of artery bypass graft Occluded vein graft to the diagonal and an occluded vein graft to the acute marginal the RCA.  S/P AAA repair Stable  TIA (transient ischemic attack) In May of this year recent ER visit for tremors with stable CT of the head  Hyperlipidemia Lipid Panel     Component Value Date/Time   CHOL 188 07/04/2013 0255   TRIG 81 07/04/2013 0255   HDL 43 07/04/2013 0255   CHOLHDL 4.4 07/04/2013 0255   VLDL 16 07/04/2013 0255   LDLCALC 129* 07/04/2013 0255   on atorvastatin 80

## 2013-11-30 ENCOUNTER — Telehealth: Payer: Self-pay | Admitting: Family Medicine

## 2013-11-30 NOTE — Telephone Encounter (Signed)
2x 1 week for 2 weeks, 1x weekly for 2 weeks , OT [phone] verbal orders needed for this

## 2013-12-01 NOTE — Telephone Encounter (Signed)
I spoke w/ Fraser Din and verbal authorization given for OT.

## 2013-12-03 NOTE — Assessment & Plan Note (Signed)
Stable

## 2013-12-03 NOTE — Assessment & Plan Note (Signed)
Lipid Panel     Component Value Date/Time   CHOL 188 07/04/2013 0255   TRIG 81 07/04/2013 0255   HDL 43 07/04/2013 0255   CHOLHDL 4.4 07/04/2013 0255   VLDL 16 07/04/2013 0255   LDLCALC 129* 07/04/2013 0255   on atorvastatin 80

## 2013-12-03 NOTE — Assessment & Plan Note (Signed)
Occluded vein graft to the diagonal and an occluded vein graft to the acute marginal the RCA.

## 2013-12-03 NOTE — Assessment & Plan Note (Signed)
In May of this year recent ER visit for tremors with stable CT of the head

## 2013-12-03 NOTE — Assessment & Plan Note (Signed)
Cath 05/2010 80%ostial left main, 60% LAD, 80% diag, occluded RCA CABG  06/24/10 with  LIMA to LAD, saphenous vein graft to first diagonal, saphenous vein graft to ramus intermedius, saphenous vein graft to acute marginal  ( circ and posterolateral branch of RCA too small to graft)  Cath 1/30/865: LV systolic function- mild inferior hypokinesis. LVEF ~ 55%; no significant mitral regurg. Severe native vessel disease w/ 2/4 grafts occluded. High grade stenosis in LAD distal to LIMA insertion; not amenable to PCI.   Cath 06/2013 demonstrated an occluded vein graft to the diagonal and an occluded vein graft to the acute marginal of the RCA. LIMA-LAD remained patent with distal disease beyond graft insertion. SVG-RI was also patent. His anatomy was unchanged from prior catheterization in 05/2012.  Nuclear stress test 10/23/2013 with new areas of ischemia EF 58%.   Currently stable he uses nitroglycerin on a when necessary basis not frequently.  He will follow with Dr. Vita Barley  In 2- 3 months

## 2013-12-21 ENCOUNTER — Other Ambulatory Visit: Payer: Self-pay

## 2013-12-21 MED ORDER — ATORVASTATIN CALCIUM 80 MG PO TABS
80.0000 mg | ORAL_TABLET | ORAL | Status: DC
Start: 2013-12-21 — End: 2014-06-18

## 2013-12-21 NOTE — Telephone Encounter (Signed)
Rx was sent to pharmacy electronically. 

## 2014-01-05 ENCOUNTER — Telehealth: Payer: Self-pay

## 2014-01-05 DIAGNOSIS — M79671 Pain in right foot: Secondary | ICD-10-CM

## 2014-01-05 DIAGNOSIS — M79672 Pain in left foot: Secondary | ICD-10-CM

## 2014-01-05 NOTE — Telephone Encounter (Signed)
I have ordered referral to podiatry though pt's foot pain is due to a problem no related to his feet. 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

## 2014-01-05 NOTE — Telephone Encounter (Signed)
PT STATES HE CALLED YESTERDAY REQUESTING A REFERRAL TO [facility], PT WANTS TO KNOW IF REFERRAL HAS BEEN COMPLETED PLEASE CALL PT TO ADVISE

## 2014-01-05 NOTE — Telephone Encounter (Signed)
Please advise if pt has requested this- nothing in EPIC and would this be for PAD?

## 2014-01-15 ENCOUNTER — Telehealth: Payer: Self-pay | Admitting: Cardiology

## 2014-01-15 NOTE — Telephone Encounter (Signed)
New message          C/o swelling feet / please give pt a call back

## 2014-01-18 ENCOUNTER — Other Ambulatory Visit: Payer: Self-pay | Admitting: *Deleted

## 2014-01-18 MED ORDER — ISOSORBIDE MONONITRATE ER 120 MG PO TB24: 120.0000 mg | ORAL_TABLET | Freq: Every day | ORAL | Status: DC

## 2014-01-18 MED ORDER — RANOLAZINE ER 1000 MG PO TB12: 1000.0000 mg | ORAL_TABLET | Freq: Two times a day (BID) | ORAL | Status: DC

## 2014-01-18 NOTE — Telephone Encounter (Signed)
Pt. Called and informed to make an appt to see Dr. Percival Spanish, pt stated understanding of instructions

## 2014-01-24 ENCOUNTER — Ambulatory Visit: Payer: Commercial Managed Care - HMO | Admitting: Physician Assistant

## 2014-02-07 ENCOUNTER — Telehealth: Payer: Self-pay | Admitting: *Deleted

## 2014-02-07 NOTE — Telephone Encounter (Signed)
At 10:48 am faxed signed referral to [facility], per Dr Leward Quan.

## 2014-02-15 NOTE — Telephone Encounter (Signed)
error 

## 2014-02-20 ENCOUNTER — Ambulatory Visit: Payer: Commercial Managed Care - HMO | Admitting: Family Medicine

## 2014-02-21 ENCOUNTER — Other Ambulatory Visit: Payer: Self-pay | Admitting: Family Medicine

## 2014-02-21 ENCOUNTER — Ambulatory Visit: Payer: Commercial Managed Care - HMO | Admitting: Family Medicine

## 2014-02-23 ENCOUNTER — Ambulatory Visit: Payer: Commercial Managed Care - HMO | Admitting: Family Medicine

## 2014-02-23 DIAGNOSIS — J189 Pneumonia, unspecified organism: Secondary | ICD-10-CM

## 2014-02-23 DIAGNOSIS — R0902 Hypoxemia: Secondary | ICD-10-CM

## 2014-02-23 HISTORY — DX: Hypoxemia: R09.02

## 2014-02-23 HISTORY — DX: Pneumonia, unspecified organism: J18.9

## 2014-02-27 ENCOUNTER — Ambulatory Visit: Payer: Commercial Managed Care - HMO | Admitting: Cardiology

## 2014-03-05 ENCOUNTER — Ambulatory Visit: Payer: Commercial Managed Care - HMO | Admitting: Family Medicine

## 2014-03-09 ENCOUNTER — Ambulatory Visit (INDEPENDENT_AMBULATORY_CARE_PROVIDER_SITE_OTHER): Payer: Commercial Managed Care - HMO

## 2014-03-09 ENCOUNTER — Ambulatory Visit (INDEPENDENT_AMBULATORY_CARE_PROVIDER_SITE_OTHER): Payer: Commercial Managed Care - HMO | Admitting: Family Medicine

## 2014-03-09 ENCOUNTER — Encounter: Payer: Self-pay | Admitting: Family Medicine

## 2014-03-09 VITALS — BP 105/60 | HR 55 | Temp 97.7°F | Resp 16 | Ht 67.0 in | Wt 170.0 lb

## 2014-03-09 DIAGNOSIS — R7989 Other specified abnormal findings of blood chemistry: Secondary | ICD-10-CM

## 2014-03-09 DIAGNOSIS — R600 Localized edema: Secondary | ICD-10-CM

## 2014-03-09 DIAGNOSIS — R609 Edema, unspecified: Secondary | ICD-10-CM

## 2014-03-09 DIAGNOSIS — R635 Abnormal weight gain: Secondary | ICD-10-CM

## 2014-03-09 DIAGNOSIS — Z8701 Personal history of pneumonia (recurrent): Secondary | ICD-10-CM

## 2014-03-09 LAB — BASIC METABOLIC PANEL WITH GFR
BUN: 11 mg/dL (ref 6–23)
CO2: 29 mEq/L (ref 19–32)
Calcium: 9 mg/dL (ref 8.4–10.5)
Chloride: 102 mEq/L (ref 96–112)
Creat: 0.83 mg/dL (ref 0.50–1.35)
GFR, Est Non African American: >89 mL/min
Glucose, Bld: 89 mg/dL (ref 70–99)
POTASSIUM: 4.5 meq/L (ref 3.5–5.3)
SODIUM: 141 meq/L (ref 135–145)

## 2014-03-09 LAB — CBC WITH DIFFERENTIAL/PLATELET
BASOS ABS: 0 10*3/uL (ref 0.0–0.1)
Basophils Relative: 0 % (ref 0–1)
EOS PCT: 1 % (ref 0–5)
Eosinophils Absolute: 0.1 10*3/uL (ref 0.0–0.7)
HCT: 46.3 % (ref 39.0–52.0)
Hemoglobin: 15.8 g/dL (ref 13.0–17.0)
LYMPHS ABS: 1.3 10*3/uL (ref 0.7–4.0)
Lymphocytes Relative: 16 % (ref 12–46)
MCH: 35.2 pg — ABNORMAL HIGH (ref 26.0–34.0)
MCHC: 34.1 g/dL (ref 30.0–36.0)
MCV: 103.1 fL — AB (ref 78.0–100.0)
MONO ABS: 0.7 10*3/uL (ref 0.1–1.0)
Monocytes Relative: 9 % (ref 3–12)
Neutro Abs: 6.1 10*3/uL (ref 1.7–7.7)
Neutrophils Relative %: 74 % (ref 43–77)
Platelets: 205 10*3/uL (ref 150–400)
RBC: 4.49 MIL/uL (ref 4.22–5.81)
RDW: 13.2 % (ref 11.5–15.5)
WBC: 8.3 10*3/uL (ref 4.0–10.5)

## 2014-03-09 LAB — THYROID PANEL WITH TSH
FREE THYROXINE INDEX: 2.3 (ref 1.4–3.8)
T3 Uptake: 31 % (ref 22–35)
T4, Total: 7.5 ug/dL (ref 4.5–12.0)
TSH: 0.971 u[IU]/mL (ref 0.350–4.500)

## 2014-03-09 LAB — VITAMIN B12: Vitamin B-12: 605 pg/mL (ref 211–911)

## 2014-03-09 NOTE — Progress Notes (Signed)
Subjective:    Patient ID: Chris Payne, male    DOB: 1950/09/14, 63 y.o.   MRN: [id number]  HPI  This 63 y.o. Cauc male is here for follow-up after hospitalization in [place] with pneumonia and COPD exacerbation. (02/21/14- 02/23/14). He feels better today, is at his baseline re: SOB. He has 2-pillow orthopnea at home; denies cough or CP/ tightness today. He denies fever/chills, diaphoresis, n/v/d. He has gained weight due to "good eating" (alot of carbs). He has Type II DM; during that hospital stay A1c= 5.5%.  Chronic leg edema is unchanged. He is receiving home PT services which he says is helpful.  He has chronic LE weakness and chronic back pain. He mentions that he wants to have surgery on his back. Pt was seen by Dr. Krista Blue in May 2015 at which time MRI was ordered. (It confirmed lumbar spondylosis, degenerative changes and bulging disc).  Pt needs labs repeated following recent hospitalization; he states he si compliant w/ all medications and supplements, particularly oral Vitamin B12.   Patient Active Problem List   Diagnosis Date Noted  . CAD (coronary artery disease) of artery bypass graft 12/03/2013  . Atypical angina 10/22/2013  . Low back pain 09/05/2013  . TIA (transient ischemic attack) 08/23/2013  . Other and unspecified angina pectoris 07/04/2013  . Nontoxic multinodular goiter 10/07/2012  . Unstable angina 06/19/2012  . Carotid stenosis   . PAD (peripheral artery disease)   . Left inguinal hernia 05/30/2012  . COPD (chronic obstructive pulmonary disease) 08/24/2011  . S/P AAA repair 08/24/2011  . Lumbar disc disease   . Hypertensive heart disease   . Hyperlipidemia   . CAD (coronary artery disease)   . History of pontine CVA   . GERD (gastroesophageal reflux disease)     Prior to Admission medications   Medication Sig Start Date End Date Taking? Authorizing Provider  aspirin EC 81 MG tablet Take 162 mg by mouth every morning.    Yes Historical  Provider, MD  B Complex-Folic Acid (B COMPLEX-VITAMIN B12) TABS Take 1 tablet by mouth daily. 11/26/13  Yes Barton Fanny, MD  carvedilol (COREG) 3.125 MG tablet Take 1 tablet (3.125 mg total) by mouth daily with breakfast. 10/26/13  Yes Minus Breeding, MD  clopidogrel (PLAVIX) 75 MG tablet Take 1 tablet (75 mg total) by mouth daily with breakfast. 09/23/13  Yes Bernadene Bell, MD  gabapentin (NEURONTIN) 100 MG capsule TAKE 1 CAPSULE BY MOUTH EACH MORNING AND2 CAPSULES AT BEDTIME AS DIRECTED 02/21/14  Yes Barton Fanny, MD  HYDROcodone-acetaminophen (NORCO) 10-325 MG per tablet Take 2 tablets by mouth every 12 (twelve) hours as needed for moderate pain.    Yes Historical Provider, MD  isosorbide mononitrate (IMDUR) 120 MG 24 hr tablet Take 1 tablet (120 mg total) by mouth daily. 01/18/14  Yes Brett Canales, PA-C  metoprolol tartrate (LOPRESSOR) 25 MG tablet Take 25 mg by mouth 2 (two) times daily.   Yes Historical Provider, MD  morphine (MS CONTIN) 30 MG 12 hr tablet Take 30 mg by mouth every 12 (twelve) hours.  07/07/13  Yes Historical Provider, MD  nitroGLYCERIN (NITROSTAT) 0.4 MG SL tablet Place 0.4 mg under the tongue every 5 (five) minutes as needed for chest pain.  06/01/13  Yes Minus Breeding, MD  ranolazine (RANEXA) 1000 MG SR tablet Take 1 tablet (1,000 mg total) by mouth 2 (two) times daily. 01/18/14  Yes Brett Canales, PA-C  tamsulosin (FLOMAX) 0.4 MG  CAPS capsule Take 0.4 mg by mouth at bedtime.    Yes Historical Provider, MD  atorvastatin (LIPITOR) 80 MG tablet Take 1 tablet (80 mg total) by mouth every morning. 12/21/13   Minus Breeding, MD    Review of Systems As per HPI.     Objective:   Physical Exam  Constitutional: He is oriented to person, place, and time. Vital signs are normal. He appears well-nourished. He does not have a sickly appearance. No distress.  Increased body fat around mid-line.  HENT:  Head: Normocephalic and atraumatic.  Right Ear: External ear normal.    Left Ear: External ear normal.  Nose: Nose normal.  Mouth/Throat: Oropharynx is clear and moist.  Eyes: Conjunctivae and EOM are normal. Pupils are equal, round, and reactive to light. No scleral icterus.  Cardiovascular: Normal rate, regular rhythm and normal heart sounds.   Pulmonary/Chest: Effort normal. No respiratory distress. He has decreased breath sounds. He has no wheezes. He has no rales.  Decreased BS at bases. Barrel-chested.  Musculoskeletal:       Right ankle: He exhibits swelling.       Left ankle: He exhibits swelling.       Right lower leg: He exhibits edema.       Left lower leg: He exhibits edema.  Neurological: He is alert and oriented to person, place, and time. No cranial nerve deficit.  Pt ambulates w/ a rolling walker; gait is slow and shuffling.  Skin: Skin is warm and dry. He is not diaphoretic. No erythema.  Psychiatric: Thought content normal. His mood appears not anxious. His affect is blunt. His affect is not inappropriate. His speech is delayed. His speech is not tangential. He is slowed.  Falt affect. He is inattentive.  Nursing note and vitals reviewed.   [facility] reading (PRIMARY) by  Dr. Leward Quan: CXR- Cardiomegaly and post-sternotomy wires. Hyperinflation. No acute infiltrate or effusion.     Assessment & Plan:  Abnormal weight gain - Some fatty weight gain and some mild fluid overload suspected. Plan: CBC with Differential, Thyroid Panel With TSH, Vitamin J88, BASIC METABOLIC PANEL WITH GFR  Edema extremities - This is chronic; no change in medications as pt is due to see cardiologist in 5 days. Plan: DG Chest 2 View, CBC with Differential, Thyroid Panel With TSH, Vitamin T25, BASIC METABOLIC PANEL WITH GFR  History of pneumonia - Resolved. Plan: DG Chest 2 View, CBC with Differential  Abnormal CBC measurement -  Chronic macrocytosis despite low-normal B12 level in July 2015.  Plan: CBC with Differential   Pt has appt with Dr. Percival Spanish on Mar 14, 2014.

## 2014-03-10 NOTE — Progress Notes (Signed)
Quick Note:  Please advise pt regarding following labs...  All labs look stable. Thyroid function is normal. Vitamin B12 level is normal. Sodium, potassium, blood sugar and kidney function results are all normal.  Keep your appointment with Dr. Percival Spanish on Wednesday, 03/14/2014. ______

## 2014-03-14 ENCOUNTER — Ambulatory Visit: Payer: Commercial Managed Care - HMO | Admitting: Cardiology

## 2014-03-14 ENCOUNTER — Ambulatory Visit: Payer: Commercial Managed Care - HMO | Admitting: Family Medicine

## 2014-03-27 ENCOUNTER — Telehealth: Payer: Self-pay

## 2014-03-27 NOTE — Telephone Encounter (Signed)
Pt wanted the DR to know he was in the emergency room in [place] the other night and is feeling better, was having seizures. Please call [phone] if needed

## 2014-04-02 NOTE — Telephone Encounter (Signed)
ERROR

## 2014-04-05 ENCOUNTER — Telehealth: Payer: Self-pay | Admitting: Family Medicine

## 2014-04-05 ENCOUNTER — Encounter (HOSPITAL_COMMUNITY): Payer: Self-pay | Admitting: Cardiology

## 2014-04-05 NOTE — Telephone Encounter (Signed)
Patient received the flu shot at [facility]

## 2014-04-11 ENCOUNTER — Telehealth: Payer: Self-pay

## 2014-04-11 NOTE — Telephone Encounter (Addendum)
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 You May call her at [phone] if needed

## 2014-04-13 NOTE — Telephone Encounter (Signed)
I called phone number [phone]) for Chris Payne but unable to leave a message (voicemail not set up).

## 2014-04-26 ENCOUNTER — Telehealth: Payer: Self-pay | Admitting: Cardiology

## 2014-04-26 NOTE — Telephone Encounter (Signed)
Spoke w/ Benjamine Mola, pharmacist at [facility].  She received an Rx transfer for pt's medications from [facility].  Pt was prescribed the Keppra by an ED physician at [facility]. I cannot locate a record of his visit there or documentation of start of Keppra, pharmacist indicates it was w/in past month.  Pt is/has been on Ranexa for +1 year.  Taking both but pharmacist concerned about potential interaction.   Will route to [place] for advice.

## 2014-04-26 NOTE — Telephone Encounter (Signed)
Spoke with Chris Payne.  Not aware of any interaction between meds.  Ok to fill

## 2014-04-26 NOTE — Telephone Encounter (Signed)
Chris Payne is calling because she has a question about a drug interaction for Ranexa and Keppra . Please call   Thanks

## 2014-04-30 ENCOUNTER — Ambulatory Visit: Payer: Commercial Managed Care - HMO | Admitting: Cardiology

## 2014-05-02 ENCOUNTER — Telehealth: Payer: Self-pay

## 2014-05-02 NOTE — Telephone Encounter (Signed)
Rogene Houston, nurse from [facility] states the following: Patient fell this morning. Although he is not hurt he is weak. Patient does not have transportation to get to our office for a visit. Could orders be placed for PT/home healthcare?   [phone]

## 2014-05-03 NOTE — Telephone Encounter (Signed)
I gave verbal authorization for Pt and nursing assessment. Pt seems to be "homebound" at this time as he has no transportation to our clinic. Billie noted a wound on heel that is healing. She assessed him after a fall; he sustained no injuries and is not in a state of decline by her assessment.

## 2014-05-07 ENCOUNTER — Telehealth: Payer: Self-pay

## 2014-05-07 NOTE — Telephone Encounter (Signed)
Pt of Dr. Leward Quan was given Keppera 500 mg twice a day by the ED. [facility] care nurse wanted Dr. Leward Quan to be aware of this.

## 2014-05-07 NOTE — Telephone Encounter (Signed)
Please contact the [facility] nurse and ask her to fax information/ medication list to [place]. Pt was not seen in ED in our system (I do not see a recent ED visit documented in EPIC). Thank you.

## 2014-05-09 NOTE — Telephone Encounter (Signed)
Ms Chris Payne called to let you know that the people that you setup a referral with didn't come out to see Chris Payne yet she doesn't know if you want to change agencies or not

## 2014-05-10 NOTE — Telephone Encounter (Signed)
Yes, that is correct. I am not sure why Ms. Blankenship is not aware of what is going on w/ Mr. Vanscyoc.

## 2014-05-10 NOTE — Telephone Encounter (Signed)
Spoke to [place]. She states she spoke with you last week and was ok with having home therapy.  Dalene Seltzer said through your conversation you were ok to have PT and nursing come to him due to his transportation limitations. Please advise.   His heel is looking much better at this time, there is more of a callus instead of an open wound.   Dalene Seltzer [phone]

## 2014-05-10 NOTE — Telephone Encounter (Signed)
Can you contact Ms. Blankenship to let her know that I do not have access to the information about Mr. Bertone's ED visit. I am not sure what is supposed to be happening as far as a current home health referral. The last home health referral that I authorized was back in July 2015. At that time, the pt was current with [facility]. [facility] needs to be contacted again to see if pt is still in their system.  Thank you.

## 2014-05-22 ENCOUNTER — Ambulatory Visit: Payer: Commercial Managed Care - HMO | Admitting: Cardiology

## 2014-05-22 DIAGNOSIS — G629 Polyneuropathy, unspecified: Secondary | ICD-10-CM | POA: Diagnosis not present

## 2014-05-22 DIAGNOSIS — Z79891 Long term (current) use of opiate analgesic: Secondary | ICD-10-CM | POA: Diagnosis not present

## 2014-05-22 DIAGNOSIS — M47816 Spondylosis without myelopathy or radiculopathy, lumbar region: Secondary | ICD-10-CM | POA: Diagnosis not present

## 2014-06-04 ENCOUNTER — Ambulatory Visit: Payer: Commercial Managed Care - HMO | Admitting: Cardiology

## 2014-06-15 ENCOUNTER — Ambulatory Visit (INDEPENDENT_AMBULATORY_CARE_PROVIDER_SITE_OTHER): Payer: Commercial Managed Care - HMO | Admitting: Cardiology

## 2014-06-15 ENCOUNTER — Encounter: Payer: Self-pay | Admitting: Cardiology

## 2014-06-15 VITALS — BP 129/82 | HR 57 | Ht 67.0 in | Wt 162.8 lb

## 2014-06-15 DIAGNOSIS — E785 Hyperlipidemia, unspecified: Secondary | ICD-10-CM | POA: Diagnosis not present

## 2014-06-15 DIAGNOSIS — Z9889 Other specified postprocedural states: Secondary | ICD-10-CM | POA: Diagnosis not present

## 2014-06-15 DIAGNOSIS — Z79899 Other long term (current) drug therapy: Secondary | ICD-10-CM

## 2014-06-15 DIAGNOSIS — R0989 Other specified symptoms and signs involving the circulatory and respiratory systems: Secondary | ICD-10-CM

## 2014-06-15 DIAGNOSIS — Z8679 Personal history of other diseases of the circulatory system: Secondary | ICD-10-CM

## 2014-06-15 LAB — HEPATIC FUNCTION PANEL
ALBUMIN: 4 g/dL (ref 3.5–5.2)
ALT: 13 U/L (ref 0–53)
AST: 13 U/L (ref 0–37)
Alkaline Phosphatase: 61 U/L (ref 39–117)
Bilirubin, Direct: 0.1 mg/dL (ref 0.0–0.3)
Indirect Bilirubin: 0.5 mg/dL (ref 0.2–1.2)
TOTAL PROTEIN: 6.6 g/dL (ref 6.0–8.3)
Total Bilirubin: 0.6 mg/dL (ref 0.2–1.2)

## 2014-06-15 LAB — LIPID PANEL
Cholesterol: 253 mg/dL — ABNORMAL HIGH (ref 0–200)
HDL: 56 mg/dL (ref >39)
LDL Cholesterol: 173 mg/dL — ABNORMAL HIGH (ref 0–99)
Total CHOL/HDL Ratio: 4.5 Ratio
Triglycerides: 120 mg/dL (ref <150)
VLDL: 24 mg/dL (ref 0–40)

## 2014-06-15 NOTE — Progress Notes (Signed)
HPI The patient presents as a new patient for followup of vascular disease and multiple medical problems. He's had an abdominal aortic aneurysm requiring surgical repair. 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   He has severe three-vessel native coronary artery disease. Catheterization March 2015 demonstrated a LIMA to the LAD is patent but with high-grade disease distal to the graft insertion, SVG to the ramus intermediate is occluded, SVG to the PDA is occluded, SVG to the diagonal is widely patent.  He does have aortic root dilatation 44 mm by echo, 45 by CT. He has increasing carotid stenosis.  Follow-up nuclear study in March of last year demonstrated no ischemia.  He is here for routine followup. He is limited by back pain and gets around with a walker. He gets back injections.  His current activity level is low.  However, he is not having any significant chest discomfort. He only takes about one nitroglycerin a month. He has some mild lower extremity swelling. He's not having any resting discomfort. He has no new shortness of breath, PND or orthopnea. He has no weight gain or edema.  No Known Allergies  Current Outpatient Prescriptions  Medication Sig Dispense Refill  . aspirin EC 81 MG tablet Take 162 mg by mouth every morning.     Marland Kitchen atorvastatin (LIPITOR) 80 MG tablet Take 1 tablet (80 mg total) by mouth every morning. 30 tablet 11  . B Complex-Folic Acid (B COMPLEX-VITAMIN B12) TABS Take 1 tablet by mouth daily. 30 tablet 5  . carvedilol (COREG) 3.125 MG tablet Take 1 tablet (3.125 mg total) by mouth daily with breakfast. 30 tablet 1  . clopidogrel (PLAVIX) 75 MG tablet Take 1 tablet (75 mg total) by mouth daily with breakfast. 30 tablet 0  . gabapentin (NEURONTIN) 100 MG capsule TAKE 1 CAPSULE BY MOUTH EACH MORNING AND2 CAPSULES AT BEDTIME AS DIRECTED 90 capsule 2  . HYDROcodone-acetaminophen (NORCO) 10-325 MG per tablet Take 2 tablets by mouth every 12 (twelve)  hours as needed for moderate pain.     . isosorbide mononitrate (IMDUR) 120 MG 24 hr tablet Take 1 tablet (120 mg total) by mouth daily. 30 tablet 5  . levETIRAcetam (KEPPRA) 500 MG tablet Take 500 mg by mouth 2 (two) times daily.    . metoprolol succinate (TOPROL-XL) 25 MG 24 hr tablet Take 12.5 mg by mouth 2 (two) times daily.    Marland Kitchen morphine (MS CONTIN) 30 MG 12 hr tablet Take 30 mg by mouth every 12 (twelve) hours.     . nitroGLYCERIN (NITROSTAT) 0.4 MG SL tablet Place 0.4 mg under the tongue every 5 (five) minutes as needed for chest pain.     . ranolazine (RANEXA) 1000 MG SR tablet Take 1 tablet (1,000 mg total) by mouth 2 (two) times daily. 60 tablet 5  . tamsulosin (FLOMAX) 0.4 MG CAPS capsule Take 0.4 mg by mouth at bedtime.      No current facility-administered medications for this visit.    Past Medical History  Diagnosis Date  . Hypertension   . Stroke     h/o pontine CVA  . AAA (abdominal aortic aneurysm)     s/p repair 6/11  . Hyperlipidemia   . CAD (coronary artery disease)     s/p CABG 2/12:  LIMA to LAD, SVG to diagonal-1, SVG to ramus intermedius,, SVG to AM (Dr. Roxan Hockey) ;  b.  Myoview 10/13:  inf infarct with very mild peri-infarct ischemia, EF 49%,  inf HK; c  He has severe three-vessel native coronary artery disease. Catheterization March 2015 as above  . Carotid stenosis     a.  dopplers 9/39: LICA 03-00%;  b. Carotid dopplers 3/14:  R 0-39%, L 60-79%, repeat 6 mos  . PAD (peripheral artery disease)     ABIs 4/12:  R 0.88, L 0.92; R SFA 40%, L CFA 50%, L SFA 50-60%  . COPD (chronic obstructive pulmonary disease)   . Inguinal hernia   . Thyroid nodule     incidental finding on carotid doppler 3/14 => thyroid U/S ordered  . Pneumonia     hx  . GERD (gastroesophageal reflux disease)   . Myocardial infarction     PCI x3  . PQZRAQTM(226.3)     Past Surgical History  Procedure Laterality Date  . Hip surgery  40 years ago    left hip bone removal and pinning    . Abdominal aortic aneurysm repair  10-03-2009  . Exploratory laparotomy  09/2009    ligation of lumbar arteries  . Coronary artery bypass graft  06/24/2010    LIMA to the LAD, SVG to first diagonal, SVG to ramus intermediate, SVG to acute marginal. EF 50%. 2/12  . External ear surgery Left     laceration child  . Inguinal hernia repair Left 07/21/2012    Procedure: HERNIA REPAIR INGUINAL ADULT;  Surgeon: Merrie Roof, MD;  Location: [place];  Service: General;  Laterality: Left;  . Insertion of mesh Left 07/21/2012    Procedure: INSERTION OF MESH;  Surgeon: Merrie Roof, MD;  Location: [place];  Service: General;  Laterality: Left;  . Exploration post operative open heart    . Nm myocar perf wall motion  12/02/2010    inferoapical defect is fixed c/w prior infarction/scar, there is minimal borderzone ischemia.  . Left heart catheterization with coronary/graft angiogram  06/21/2012    Procedure: LEFT HEART CATHETERIZATION WITH Beatrix Fetters;  Surgeon: Minus Breeding, MD;  Location: [facility] CATH LAB;  Service: Cardiovascular;;  . Left heart catheterization with coronary/graft angiogram N/A 07/04/2013    Procedure: LEFT HEART CATHETERIZATION WITH Beatrix Fetters;  Surgeon: Sinclair Grooms, MD;  Location: [facility] CATH LAB;  Service: Cardiovascular;  Laterality: N/A;   ROS:  Occasional abdominal pain. Otherwise as stated in the HPI and negative for all other systems.  PHYSICAL EXAM BP 129/82 mmHg  Pulse 57  Ht 5\' 7"  (1.702 m)  Wt 162 lb 12.8 oz (73.846 kg)  BMI 25.49 kg/m2 GENERAL: Looks older than stated age, chronically ill-appearing HEENT:  Pupils equal round and reactive, fundi not visualized, oral mucosa unremarkable, dentures NECK:  No jugular venous distention, waveform within normal limits, carotid upstroke brisk and symmetric, no bruits, no thyromegaly LYMPHATICS:  No cervical, inguinal adenopathy LUNGS:  Clear to auscultation bilaterally BACK:  No CVA tenderness CHEST:   Well healed sternotomy scar. HEART:  PMI not displaced or sustained,S1 and S2 within normal limits, no S3, no S4, no clicks, no rubs, no murmurs ABD:  Flat, positive bowel sounds normal in frequency in pitch, no bruits, no rebound, no guarding, no midline pulsatile mass, no hepatomegaly, no splenomegaly, abdominal scar. EXT:  2 plus pulses upper, diminished bilateral lower, mild bilateral lower extremity edema, no cyanosis no clubbing, bilateral bruits, left leg is shorter than right  SKIN:  No rashes no nodules NEURO:  Cranial nerves II through XII grossly intact, motor grossly intact throughout PSYCH:  Cognitively intact, oriented  to person place and time  EKG:  Sinus rhythm, rate 53, axis within normal limits, intervals within normal limits, nonspecific anterior T-wave inversion, poor anterior R wave progression, no acute ST-T wave changes since previous ECG.  06/15/2014  ASSESSMENT AND PLAN  CAD:  The patient has severe three-vessel and wrapped disease. He needs however continue medical management. His symptoms are stable and exertional. No change in therapy is indicated.   Carotid Stenosis:   He had 1-39% right stenosis and 60-79% left stenosis. He is due for follow-up and we will arrange this.  Hyperlipidemia:  I will check a lipid profile today.   Abdominal Aortic Aneurysm, s/p Repair: I will order a follow up ultrasound  Thoracic aortic enlargement:  He had stable CT angiogram in May of last year.  I will defer imaging this year.

## 2014-06-15 NOTE — Patient Instructions (Signed)
Your physician recommends that you schedule a follow-up appointment in: one year with Dr. Percival Spanish  We have labs for you to do today  We have ordered two doppler studies for you to get done

## 2014-06-18 ENCOUNTER — Other Ambulatory Visit: Payer: Self-pay | Admitting: *Deleted

## 2014-06-18 DIAGNOSIS — E785 Hyperlipidemia, unspecified: Secondary | ICD-10-CM

## 2014-06-18 DIAGNOSIS — Z79899 Other long term (current) drug therapy: Secondary | ICD-10-CM

## 2014-06-18 MED ORDER — ROSUVASTATIN CALCIUM 40 MG PO TABS: 40.0000 mg | ORAL_TABLET | Freq: Every day | ORAL | Status: DC

## 2014-06-20 DIAGNOSIS — Z8673 Personal history of transient ischemic attack (TIA), and cerebral infarction without residual deficits: Secondary | ICD-10-CM | POA: Diagnosis not present

## 2014-06-20 DIAGNOSIS — R069 Unspecified abnormalities of breathing: Secondary | ICD-10-CM | POA: Diagnosis not present

## 2014-06-20 DIAGNOSIS — Z951 Presence of aortocoronary bypass graft: Secondary | ICD-10-CM | POA: Diagnosis not present

## 2014-06-20 DIAGNOSIS — J449 Chronic obstructive pulmonary disease, unspecified: Secondary | ICD-10-CM | POA: Diagnosis not present

## 2014-06-20 DIAGNOSIS — R05 Cough: Secondary | ICD-10-CM | POA: Diagnosis not present

## 2014-06-20 DIAGNOSIS — R0602 Shortness of breath: Secondary | ICD-10-CM | POA: Diagnosis not present

## 2014-06-20 DIAGNOSIS — J984 Other disorders of lung: Secondary | ICD-10-CM | POA: Diagnosis not present

## 2014-06-20 DIAGNOSIS — J069 Acute upper respiratory infection, unspecified: Secondary | ICD-10-CM | POA: Diagnosis not present

## 2014-06-20 DIAGNOSIS — Z7982 Long term (current) use of aspirin: Secondary | ICD-10-CM | POA: Diagnosis not present

## 2014-06-20 DIAGNOSIS — Z79899 Other long term (current) drug therapy: Secondary | ICD-10-CM | POA: Diagnosis not present

## 2014-06-20 DIAGNOSIS — E780 Pure hypercholesterolemia: Secondary | ICD-10-CM | POA: Diagnosis not present

## 2014-06-21 DIAGNOSIS — R05 Cough: Secondary | ICD-10-CM | POA: Diagnosis not present

## 2014-06-21 DIAGNOSIS — R0602 Shortness of breath: Secondary | ICD-10-CM | POA: Diagnosis not present

## 2014-06-21 DIAGNOSIS — Z951 Presence of aortocoronary bypass graft: Secondary | ICD-10-CM | POA: Diagnosis not present

## 2014-06-21 DIAGNOSIS — J189 Pneumonia, unspecified organism: Secondary | ICD-10-CM | POA: Diagnosis not present

## 2014-06-21 DIAGNOSIS — K922 Gastrointestinal hemorrhage, unspecified: Secondary | ICD-10-CM | POA: Diagnosis not present

## 2014-06-21 DIAGNOSIS — R1310 Dysphagia, unspecified: Secondary | ICD-10-CM | POA: Diagnosis not present

## 2014-06-21 DIAGNOSIS — J439 Emphysema, unspecified: Secondary | ICD-10-CM | POA: Diagnosis not present

## 2014-06-21 DIAGNOSIS — A419 Sepsis, unspecified organism: Secondary | ICD-10-CM | POA: Diagnosis not present

## 2014-06-21 DIAGNOSIS — J441 Chronic obstructive pulmonary disease with (acute) exacerbation: Secondary | ICD-10-CM | POA: Diagnosis not present

## 2014-06-21 DIAGNOSIS — J80 Acute respiratory distress syndrome: Secondary | ICD-10-CM | POA: Diagnosis not present

## 2014-06-21 DIAGNOSIS — K2970 Gastritis, unspecified, without bleeding: Secondary | ICD-10-CM | POA: Diagnosis not present

## 2014-06-21 DIAGNOSIS — Z87891 Personal history of nicotine dependence: Secondary | ICD-10-CM | POA: Diagnosis not present

## 2014-06-21 DIAGNOSIS — I252 Old myocardial infarction: Secondary | ICD-10-CM | POA: Diagnosis not present

## 2014-06-21 DIAGNOSIS — R40242 Glasgow coma scale score 9-12: Secondary | ICD-10-CM | POA: Diagnosis not present

## 2014-06-21 DIAGNOSIS — K229 Disease of esophagus, unspecified: Secondary | ICD-10-CM | POA: Diagnosis not present

## 2014-06-21 DIAGNOSIS — K449 Diaphragmatic hernia without obstruction or gangrene: Secondary | ICD-10-CM | POA: Diagnosis not present

## 2014-06-21 DIAGNOSIS — K920 Hematemesis: Secondary | ICD-10-CM | POA: Diagnosis not present

## 2014-06-21 DIAGNOSIS — J449 Chronic obstructive pulmonary disease, unspecified: Secondary | ICD-10-CM | POA: Diagnosis not present

## 2014-06-21 DIAGNOSIS — R112 Nausea with vomiting, unspecified: Secondary | ICD-10-CM | POA: Diagnosis not present

## 2014-06-21 DIAGNOSIS — I2510 Atherosclerotic heart disease of native coronary artery without angina pectoris: Secondary | ICD-10-CM | POA: Diagnosis not present

## 2014-06-21 DIAGNOSIS — I10 Essential (primary) hypertension: Secondary | ICD-10-CM | POA: Diagnosis not present

## 2014-06-21 DIAGNOSIS — J9811 Atelectasis: Secondary | ICD-10-CM | POA: Diagnosis not present

## 2014-06-21 DIAGNOSIS — R918 Other nonspecific abnormal finding of lung field: Secondary | ICD-10-CM | POA: Diagnosis not present

## 2014-06-21 DIAGNOSIS — B3781 Candidal esophagitis: Secondary | ICD-10-CM | POA: Diagnosis not present

## 2014-06-21 DIAGNOSIS — I639 Cerebral infarction, unspecified: Secondary | ICD-10-CM | POA: Diagnosis not present

## 2014-06-21 DIAGNOSIS — Z8673 Personal history of transient ischemic attack (TIA), and cerebral infarction without residual deficits: Secondary | ICD-10-CM | POA: Diagnosis not present

## 2014-06-21 NOTE — Addendum Note (Signed)
Addended by: Vear Clock on: 06/21/2014 01:58 PM   Modules accepted: Orders

## 2014-06-27 DIAGNOSIS — Z951 Presence of aortocoronary bypass graft: Secondary | ICD-10-CM | POA: Diagnosis not present

## 2014-06-27 DIAGNOSIS — I252 Old myocardial infarction: Secondary | ICD-10-CM | POA: Diagnosis not present

## 2014-06-27 DIAGNOSIS — R569 Unspecified convulsions: Secondary | ICD-10-CM | POA: Diagnosis not present

## 2014-06-27 DIAGNOSIS — J441 Chronic obstructive pulmonary disease with (acute) exacerbation: Secondary | ICD-10-CM | POA: Diagnosis not present

## 2014-06-27 DIAGNOSIS — I69351 Hemiplegia and hemiparesis following cerebral infarction affecting right dominant side: Secondary | ICD-10-CM | POA: Diagnosis not present

## 2014-06-27 DIAGNOSIS — R52 Pain, unspecified: Secondary | ICD-10-CM | POA: Diagnosis not present

## 2014-06-27 DIAGNOSIS — Z87891 Personal history of nicotine dependence: Secondary | ICD-10-CM | POA: Diagnosis not present

## 2014-06-27 DIAGNOSIS — I2510 Atherosclerotic heart disease of native coronary artery without angina pectoris: Secondary | ICD-10-CM | POA: Diagnosis not present

## 2014-06-27 DIAGNOSIS — Z8701 Personal history of pneumonia (recurrent): Secondary | ICD-10-CM | POA: Diagnosis not present

## 2014-06-28 ENCOUNTER — Telehealth: Payer: Self-pay

## 2014-06-28 DIAGNOSIS — Z79891 Long term (current) use of opiate analgesic: Secondary | ICD-10-CM | POA: Diagnosis not present

## 2014-06-28 DIAGNOSIS — M47816 Spondylosis without myelopathy or radiculopathy, lumbar region: Secondary | ICD-10-CM | POA: Diagnosis not present

## 2014-06-28 DIAGNOSIS — G894 Chronic pain syndrome: Secondary | ICD-10-CM | POA: Diagnosis not present

## 2014-06-28 DIAGNOSIS — G629 Polyneuropathy, unspecified: Secondary | ICD-10-CM | POA: Diagnosis not present

## 2014-06-28 NOTE — Telephone Encounter (Signed)
Cristal Generous is calling from [place] in regards to patient's hospital visit for pneumonia. Clarise Cruz needs Dr. Leward Quan to cover orders for home health. Please call Clarise Cruz! [phone]

## 2014-06-29 DIAGNOSIS — R52 Pain, unspecified: Secondary | ICD-10-CM | POA: Diagnosis not present

## 2014-06-29 DIAGNOSIS — I2510 Atherosclerotic heart disease of native coronary artery without angina pectoris: Secondary | ICD-10-CM | POA: Diagnosis not present

## 2014-06-29 DIAGNOSIS — I252 Old myocardial infarction: Secondary | ICD-10-CM | POA: Diagnosis not present

## 2014-06-29 DIAGNOSIS — Z87891 Personal history of nicotine dependence: Secondary | ICD-10-CM | POA: Diagnosis not present

## 2014-06-29 DIAGNOSIS — I69351 Hemiplegia and hemiparesis following cerebral infarction affecting right dominant side: Secondary | ICD-10-CM | POA: Diagnosis not present

## 2014-06-29 DIAGNOSIS — Z951 Presence of aortocoronary bypass graft: Secondary | ICD-10-CM | POA: Diagnosis not present

## 2014-06-29 DIAGNOSIS — Z8701 Personal history of pneumonia (recurrent): Secondary | ICD-10-CM | POA: Diagnosis not present

## 2014-06-29 DIAGNOSIS — J441 Chronic obstructive pulmonary disease with (acute) exacerbation: Secondary | ICD-10-CM | POA: Diagnosis not present

## 2014-06-29 DIAGNOSIS — R569 Unspecified convulsions: Secondary | ICD-10-CM | POA: Diagnosis not present

## 2014-06-29 NOTE — Telephone Encounter (Signed)
I spoke w/ Chris Payne and authorized home health w/ [facility]. Pt discharged from [facility] after treatment for pneumonia; He has several chronic problems and they will get PT to work w/ him also.

## 2014-07-03 ENCOUNTER — Ambulatory Visit: Payer: Commercial Managed Care - HMO | Admitting: Family Medicine

## 2014-07-03 DIAGNOSIS — R52 Pain, unspecified: Secondary | ICD-10-CM | POA: Diagnosis not present

## 2014-07-03 DIAGNOSIS — I69351 Hemiplegia and hemiparesis following cerebral infarction affecting right dominant side: Secondary | ICD-10-CM | POA: Diagnosis not present

## 2014-07-03 DIAGNOSIS — Z951 Presence of aortocoronary bypass graft: Secondary | ICD-10-CM | POA: Diagnosis not present

## 2014-07-03 DIAGNOSIS — I252 Old myocardial infarction: Secondary | ICD-10-CM | POA: Diagnosis not present

## 2014-07-03 DIAGNOSIS — Z87891 Personal history of nicotine dependence: Secondary | ICD-10-CM | POA: Diagnosis not present

## 2014-07-03 DIAGNOSIS — R569 Unspecified convulsions: Secondary | ICD-10-CM | POA: Diagnosis not present

## 2014-07-03 DIAGNOSIS — I2510 Atherosclerotic heart disease of native coronary artery without angina pectoris: Secondary | ICD-10-CM | POA: Diagnosis not present

## 2014-07-03 DIAGNOSIS — Z8701 Personal history of pneumonia (recurrent): Secondary | ICD-10-CM | POA: Diagnosis not present

## 2014-07-03 DIAGNOSIS — J441 Chronic obstructive pulmonary disease with (acute) exacerbation: Secondary | ICD-10-CM | POA: Diagnosis not present

## 2014-07-04 ENCOUNTER — Encounter: Payer: Self-pay | Admitting: *Deleted

## 2014-07-04 DIAGNOSIS — I2510 Atherosclerotic heart disease of native coronary artery without angina pectoris: Secondary | ICD-10-CM | POA: Diagnosis not present

## 2014-07-04 DIAGNOSIS — I69351 Hemiplegia and hemiparesis following cerebral infarction affecting right dominant side: Secondary | ICD-10-CM | POA: Diagnosis not present

## 2014-07-04 DIAGNOSIS — Z951 Presence of aortocoronary bypass graft: Secondary | ICD-10-CM | POA: Diagnosis not present

## 2014-07-04 DIAGNOSIS — R569 Unspecified convulsions: Secondary | ICD-10-CM | POA: Diagnosis not present

## 2014-07-04 DIAGNOSIS — Z87891 Personal history of nicotine dependence: Secondary | ICD-10-CM | POA: Diagnosis not present

## 2014-07-04 DIAGNOSIS — R52 Pain, unspecified: Secondary | ICD-10-CM | POA: Diagnosis not present

## 2014-07-04 DIAGNOSIS — Z8701 Personal history of pneumonia (recurrent): Secondary | ICD-10-CM | POA: Diagnosis not present

## 2014-07-04 DIAGNOSIS — J441 Chronic obstructive pulmonary disease with (acute) exacerbation: Secondary | ICD-10-CM | POA: Diagnosis not present

## 2014-07-04 DIAGNOSIS — I252 Old myocardial infarction: Secondary | ICD-10-CM | POA: Diagnosis not present

## 2014-07-05 ENCOUNTER — Ambulatory Visit (HOSPITAL_BASED_OUTPATIENT_CLINIC_OR_DEPARTMENT_OTHER)
Admission: RE | Admit: 2014-07-05 | Discharge: 2014-07-05 | Disposition: A | Discharge status: Home / Self Care | Payer: Commercial Managed Care - HMO | Source: Ambulatory Visit | Attending: Cardiology | Admitting: Cardiology

## 2014-07-05 ENCOUNTER — Ambulatory Visit (HOSPITAL_COMMUNITY)
Admission: RE | Admit: 2014-07-05 | Discharge: 2014-07-05 | Disposition: A | Discharge status: Home / Self Care | Payer: Commercial Managed Care - HMO | Source: Ambulatory Visit | Attending: Cardiology | Admitting: Cardiology

## 2014-07-05 DIAGNOSIS — Z8679 Personal history of other diseases of the circulatory system: Secondary | ICD-10-CM

## 2014-07-05 DIAGNOSIS — J441 Chronic obstructive pulmonary disease with (acute) exacerbation: Secondary | ICD-10-CM | POA: Diagnosis not present

## 2014-07-05 DIAGNOSIS — Z8701 Personal history of pneumonia (recurrent): Secondary | ICD-10-CM | POA: Diagnosis not present

## 2014-07-05 DIAGNOSIS — Z9889 Other specified postprocedural states: Secondary | ICD-10-CM | POA: Insufficient documentation

## 2014-07-05 DIAGNOSIS — R52 Pain, unspecified: Secondary | ICD-10-CM | POA: Diagnosis not present

## 2014-07-05 DIAGNOSIS — I69351 Hemiplegia and hemiparesis following cerebral infarction affecting right dominant side: Secondary | ICD-10-CM | POA: Diagnosis not present

## 2014-07-05 DIAGNOSIS — R0989 Other specified symptoms and signs involving the circulatory and respiratory systems: Secondary | ICD-10-CM | POA: Insufficient documentation

## 2014-07-05 DIAGNOSIS — R569 Unspecified convulsions: Secondary | ICD-10-CM | POA: Diagnosis not present

## 2014-07-05 DIAGNOSIS — I252 Old myocardial infarction: Secondary | ICD-10-CM | POA: Diagnosis not present

## 2014-07-05 DIAGNOSIS — I2510 Atherosclerotic heart disease of native coronary artery without angina pectoris: Secondary | ICD-10-CM | POA: Diagnosis not present

## 2014-07-05 DIAGNOSIS — Z951 Presence of aortocoronary bypass graft: Secondary | ICD-10-CM | POA: Diagnosis not present

## 2014-07-05 DIAGNOSIS — Z87891 Personal history of nicotine dependence: Secondary | ICD-10-CM | POA: Diagnosis not present

## 2014-07-05 NOTE — Progress Notes (Signed)
Carotid Duplex Completed. °Brianna L Mazza,RVT °

## 2014-07-05 NOTE — Progress Notes (Signed)
Abdominal Aortic Duplex Completed °Brianna L Mazza,RVT °

## 2014-07-06 DIAGNOSIS — R569 Unspecified convulsions: Secondary | ICD-10-CM | POA: Diagnosis not present

## 2014-07-06 DIAGNOSIS — Z87891 Personal history of nicotine dependence: Secondary | ICD-10-CM | POA: Diagnosis not present

## 2014-07-06 DIAGNOSIS — I252 Old myocardial infarction: Secondary | ICD-10-CM | POA: Diagnosis not present

## 2014-07-06 DIAGNOSIS — I69351 Hemiplegia and hemiparesis following cerebral infarction affecting right dominant side: Secondary | ICD-10-CM | POA: Diagnosis not present

## 2014-07-06 DIAGNOSIS — Z951 Presence of aortocoronary bypass graft: Secondary | ICD-10-CM | POA: Diagnosis not present

## 2014-07-06 DIAGNOSIS — R52 Pain, unspecified: Secondary | ICD-10-CM | POA: Diagnosis not present

## 2014-07-06 DIAGNOSIS — J441 Chronic obstructive pulmonary disease with (acute) exacerbation: Secondary | ICD-10-CM | POA: Diagnosis not present

## 2014-07-06 DIAGNOSIS — I2510 Atherosclerotic heart disease of native coronary artery without angina pectoris: Secondary | ICD-10-CM | POA: Diagnosis not present

## 2014-07-06 DIAGNOSIS — Z8701 Personal history of pneumonia (recurrent): Secondary | ICD-10-CM | POA: Diagnosis not present

## 2014-07-09 DIAGNOSIS — Z951 Presence of aortocoronary bypass graft: Secondary | ICD-10-CM | POA: Diagnosis not present

## 2014-07-09 DIAGNOSIS — I69351 Hemiplegia and hemiparesis following cerebral infarction affecting right dominant side: Secondary | ICD-10-CM | POA: Diagnosis not present

## 2014-07-09 DIAGNOSIS — Z8701 Personal history of pneumonia (recurrent): Secondary | ICD-10-CM | POA: Diagnosis not present

## 2014-07-09 DIAGNOSIS — Z87891 Personal history of nicotine dependence: Secondary | ICD-10-CM | POA: Diagnosis not present

## 2014-07-09 DIAGNOSIS — I2510 Atherosclerotic heart disease of native coronary artery without angina pectoris: Secondary | ICD-10-CM | POA: Diagnosis not present

## 2014-07-09 DIAGNOSIS — J441 Chronic obstructive pulmonary disease with (acute) exacerbation: Secondary | ICD-10-CM | POA: Diagnosis not present

## 2014-07-09 DIAGNOSIS — I252 Old myocardial infarction: Secondary | ICD-10-CM | POA: Diagnosis not present

## 2014-07-09 DIAGNOSIS — R52 Pain, unspecified: Secondary | ICD-10-CM | POA: Diagnosis not present

## 2014-07-09 DIAGNOSIS — R569 Unspecified convulsions: Secondary | ICD-10-CM | POA: Diagnosis not present

## 2014-07-10 ENCOUNTER — Ambulatory Visit: Payer: Commercial Managed Care - HMO | Admitting: Family Medicine

## 2014-07-10 DIAGNOSIS — R52 Pain, unspecified: Secondary | ICD-10-CM | POA: Diagnosis not present

## 2014-07-10 DIAGNOSIS — I69351 Hemiplegia and hemiparesis following cerebral infarction affecting right dominant side: Secondary | ICD-10-CM | POA: Diagnosis not present

## 2014-07-10 DIAGNOSIS — R569 Unspecified convulsions: Secondary | ICD-10-CM | POA: Diagnosis not present

## 2014-07-10 DIAGNOSIS — I252 Old myocardial infarction: Secondary | ICD-10-CM | POA: Diagnosis not present

## 2014-07-10 DIAGNOSIS — Z87891 Personal history of nicotine dependence: Secondary | ICD-10-CM | POA: Diagnosis not present

## 2014-07-10 DIAGNOSIS — Z8701 Personal history of pneumonia (recurrent): Secondary | ICD-10-CM | POA: Diagnosis not present

## 2014-07-10 DIAGNOSIS — Z951 Presence of aortocoronary bypass graft: Secondary | ICD-10-CM | POA: Diagnosis not present

## 2014-07-10 DIAGNOSIS — J441 Chronic obstructive pulmonary disease with (acute) exacerbation: Secondary | ICD-10-CM | POA: Diagnosis not present

## 2014-07-10 DIAGNOSIS — I2510 Atherosclerotic heart disease of native coronary artery without angina pectoris: Secondary | ICD-10-CM | POA: Diagnosis not present

## 2014-07-11 DIAGNOSIS — I2510 Atherosclerotic heart disease of native coronary artery without angina pectoris: Secondary | ICD-10-CM | POA: Diagnosis not present

## 2014-07-11 DIAGNOSIS — Z87891 Personal history of nicotine dependence: Secondary | ICD-10-CM | POA: Diagnosis not present

## 2014-07-11 DIAGNOSIS — J441 Chronic obstructive pulmonary disease with (acute) exacerbation: Secondary | ICD-10-CM | POA: Diagnosis not present

## 2014-07-11 DIAGNOSIS — I69351 Hemiplegia and hemiparesis following cerebral infarction affecting right dominant side: Secondary | ICD-10-CM | POA: Diagnosis not present

## 2014-07-11 DIAGNOSIS — R52 Pain, unspecified: Secondary | ICD-10-CM | POA: Diagnosis not present

## 2014-07-11 DIAGNOSIS — Z951 Presence of aortocoronary bypass graft: Secondary | ICD-10-CM | POA: Diagnosis not present

## 2014-07-11 DIAGNOSIS — Z8701 Personal history of pneumonia (recurrent): Secondary | ICD-10-CM | POA: Diagnosis not present

## 2014-07-11 DIAGNOSIS — I252 Old myocardial infarction: Secondary | ICD-10-CM | POA: Diagnosis not present

## 2014-07-11 DIAGNOSIS — R569 Unspecified convulsions: Secondary | ICD-10-CM | POA: Diagnosis not present

## 2014-07-12 DIAGNOSIS — J441 Chronic obstructive pulmonary disease with (acute) exacerbation: Secondary | ICD-10-CM | POA: Diagnosis not present

## 2014-07-12 DIAGNOSIS — Z8701 Personal history of pneumonia (recurrent): Secondary | ICD-10-CM | POA: Diagnosis not present

## 2014-07-12 DIAGNOSIS — I69351 Hemiplegia and hemiparesis following cerebral infarction affecting right dominant side: Secondary | ICD-10-CM | POA: Diagnosis not present

## 2014-07-12 DIAGNOSIS — I2510 Atherosclerotic heart disease of native coronary artery without angina pectoris: Secondary | ICD-10-CM | POA: Diagnosis not present

## 2014-07-12 DIAGNOSIS — I252 Old myocardial infarction: Secondary | ICD-10-CM | POA: Diagnosis not present

## 2014-07-12 DIAGNOSIS — Z951 Presence of aortocoronary bypass graft: Secondary | ICD-10-CM | POA: Diagnosis not present

## 2014-07-12 DIAGNOSIS — R52 Pain, unspecified: Secondary | ICD-10-CM | POA: Diagnosis not present

## 2014-07-12 DIAGNOSIS — R569 Unspecified convulsions: Secondary | ICD-10-CM | POA: Diagnosis not present

## 2014-07-12 DIAGNOSIS — Z87891 Personal history of nicotine dependence: Secondary | ICD-10-CM | POA: Diagnosis not present

## 2014-07-13 ENCOUNTER — Ambulatory Visit: Payer: Commercial Managed Care - HMO | Admitting: Family Medicine

## 2014-07-14 DIAGNOSIS — R52 Pain, unspecified: Secondary | ICD-10-CM | POA: Diagnosis not present

## 2014-07-14 DIAGNOSIS — Z951 Presence of aortocoronary bypass graft: Secondary | ICD-10-CM | POA: Diagnosis not present

## 2014-07-14 DIAGNOSIS — I2510 Atherosclerotic heart disease of native coronary artery without angina pectoris: Secondary | ICD-10-CM | POA: Diagnosis not present

## 2014-07-14 DIAGNOSIS — I252 Old myocardial infarction: Secondary | ICD-10-CM | POA: Diagnosis not present

## 2014-07-14 DIAGNOSIS — Z87891 Personal history of nicotine dependence: Secondary | ICD-10-CM | POA: Diagnosis not present

## 2014-07-14 DIAGNOSIS — I69351 Hemiplegia and hemiparesis following cerebral infarction affecting right dominant side: Secondary | ICD-10-CM | POA: Diagnosis not present

## 2014-07-14 DIAGNOSIS — Z8701 Personal history of pneumonia (recurrent): Secondary | ICD-10-CM | POA: Diagnosis not present

## 2014-07-14 DIAGNOSIS — J441 Chronic obstructive pulmonary disease with (acute) exacerbation: Secondary | ICD-10-CM | POA: Diagnosis not present

## 2014-07-14 DIAGNOSIS — R569 Unspecified convulsions: Secondary | ICD-10-CM | POA: Diagnosis not present

## 2014-07-17 ENCOUNTER — Ambulatory Visit: Payer: Commercial Managed Care - HMO | Admitting: Family Medicine

## 2014-07-17 DIAGNOSIS — Z951 Presence of aortocoronary bypass graft: Secondary | ICD-10-CM | POA: Diagnosis not present

## 2014-07-17 DIAGNOSIS — I2510 Atherosclerotic heart disease of native coronary artery without angina pectoris: Secondary | ICD-10-CM | POA: Diagnosis not present

## 2014-07-17 DIAGNOSIS — Z87891 Personal history of nicotine dependence: Secondary | ICD-10-CM | POA: Diagnosis not present

## 2014-07-17 DIAGNOSIS — Z8701 Personal history of pneumonia (recurrent): Secondary | ICD-10-CM | POA: Diagnosis not present

## 2014-07-17 DIAGNOSIS — J441 Chronic obstructive pulmonary disease with (acute) exacerbation: Secondary | ICD-10-CM | POA: Diagnosis not present

## 2014-07-17 DIAGNOSIS — R52 Pain, unspecified: Secondary | ICD-10-CM | POA: Diagnosis not present

## 2014-07-17 DIAGNOSIS — I69351 Hemiplegia and hemiparesis following cerebral infarction affecting right dominant side: Secondary | ICD-10-CM | POA: Diagnosis not present

## 2014-07-17 DIAGNOSIS — I252 Old myocardial infarction: Secondary | ICD-10-CM | POA: Diagnosis not present

## 2014-07-17 DIAGNOSIS — R569 Unspecified convulsions: Secondary | ICD-10-CM | POA: Diagnosis not present

## 2014-07-18 DIAGNOSIS — R52 Pain, unspecified: Secondary | ICD-10-CM | POA: Diagnosis not present

## 2014-07-18 DIAGNOSIS — I69351 Hemiplegia and hemiparesis following cerebral infarction affecting right dominant side: Secondary | ICD-10-CM | POA: Diagnosis not present

## 2014-07-18 DIAGNOSIS — Z8701 Personal history of pneumonia (recurrent): Secondary | ICD-10-CM | POA: Diagnosis not present

## 2014-07-18 DIAGNOSIS — R569 Unspecified convulsions: Secondary | ICD-10-CM | POA: Diagnosis not present

## 2014-07-18 DIAGNOSIS — I252 Old myocardial infarction: Secondary | ICD-10-CM | POA: Diagnosis not present

## 2014-07-18 DIAGNOSIS — J441 Chronic obstructive pulmonary disease with (acute) exacerbation: Secondary | ICD-10-CM | POA: Diagnosis not present

## 2014-07-18 DIAGNOSIS — Z87891 Personal history of nicotine dependence: Secondary | ICD-10-CM | POA: Diagnosis not present

## 2014-07-18 DIAGNOSIS — Z951 Presence of aortocoronary bypass graft: Secondary | ICD-10-CM | POA: Diagnosis not present

## 2014-07-18 DIAGNOSIS — I2510 Atherosclerotic heart disease of native coronary artery without angina pectoris: Secondary | ICD-10-CM | POA: Diagnosis not present

## 2014-07-19 DIAGNOSIS — R569 Unspecified convulsions: Secondary | ICD-10-CM | POA: Diagnosis not present

## 2014-07-19 DIAGNOSIS — I69351 Hemiplegia and hemiparesis following cerebral infarction affecting right dominant side: Secondary | ICD-10-CM | POA: Diagnosis not present

## 2014-07-19 DIAGNOSIS — Z87891 Personal history of nicotine dependence: Secondary | ICD-10-CM | POA: Diagnosis not present

## 2014-07-19 DIAGNOSIS — I2510 Atherosclerotic heart disease of native coronary artery without angina pectoris: Secondary | ICD-10-CM | POA: Diagnosis not present

## 2014-07-19 DIAGNOSIS — Z951 Presence of aortocoronary bypass graft: Secondary | ICD-10-CM | POA: Diagnosis not present

## 2014-07-19 DIAGNOSIS — I252 Old myocardial infarction: Secondary | ICD-10-CM | POA: Diagnosis not present

## 2014-07-19 DIAGNOSIS — R52 Pain, unspecified: Secondary | ICD-10-CM | POA: Diagnosis not present

## 2014-07-19 DIAGNOSIS — J441 Chronic obstructive pulmonary disease with (acute) exacerbation: Secondary | ICD-10-CM | POA: Diagnosis not present

## 2014-07-19 DIAGNOSIS — Z8701 Personal history of pneumonia (recurrent): Secondary | ICD-10-CM | POA: Diagnosis not present

## 2014-07-20 DIAGNOSIS — Z951 Presence of aortocoronary bypass graft: Secondary | ICD-10-CM | POA: Diagnosis not present

## 2014-07-20 DIAGNOSIS — I252 Old myocardial infarction: Secondary | ICD-10-CM | POA: Diagnosis not present

## 2014-07-20 DIAGNOSIS — I2510 Atherosclerotic heart disease of native coronary artery without angina pectoris: Secondary | ICD-10-CM | POA: Diagnosis not present

## 2014-07-20 DIAGNOSIS — R52 Pain, unspecified: Secondary | ICD-10-CM | POA: Diagnosis not present

## 2014-07-20 DIAGNOSIS — R569 Unspecified convulsions: Secondary | ICD-10-CM | POA: Diagnosis not present

## 2014-07-20 DIAGNOSIS — I69351 Hemiplegia and hemiparesis following cerebral infarction affecting right dominant side: Secondary | ICD-10-CM | POA: Diagnosis not present

## 2014-07-20 DIAGNOSIS — Z87891 Personal history of nicotine dependence: Secondary | ICD-10-CM | POA: Diagnosis not present

## 2014-07-20 DIAGNOSIS — Z8701 Personal history of pneumonia (recurrent): Secondary | ICD-10-CM | POA: Diagnosis not present

## 2014-07-20 DIAGNOSIS — J441 Chronic obstructive pulmonary disease with (acute) exacerbation: Secondary | ICD-10-CM | POA: Diagnosis not present

## 2014-07-24 DIAGNOSIS — I2510 Atherosclerotic heart disease of native coronary artery without angina pectoris: Secondary | ICD-10-CM | POA: Diagnosis not present

## 2014-07-24 DIAGNOSIS — R569 Unspecified convulsions: Secondary | ICD-10-CM | POA: Diagnosis not present

## 2014-07-24 DIAGNOSIS — Z87891 Personal history of nicotine dependence: Secondary | ICD-10-CM | POA: Diagnosis not present

## 2014-07-24 DIAGNOSIS — Z951 Presence of aortocoronary bypass graft: Secondary | ICD-10-CM | POA: Diagnosis not present

## 2014-07-24 DIAGNOSIS — I252 Old myocardial infarction: Secondary | ICD-10-CM | POA: Diagnosis not present

## 2014-07-24 DIAGNOSIS — J441 Chronic obstructive pulmonary disease with (acute) exacerbation: Secondary | ICD-10-CM | POA: Diagnosis not present

## 2014-07-24 DIAGNOSIS — I69351 Hemiplegia and hemiparesis following cerebral infarction affecting right dominant side: Secondary | ICD-10-CM | POA: Diagnosis not present

## 2014-07-24 DIAGNOSIS — Z8701 Personal history of pneumonia (recurrent): Secondary | ICD-10-CM | POA: Diagnosis not present

## 2014-07-24 DIAGNOSIS — R52 Pain, unspecified: Secondary | ICD-10-CM | POA: Diagnosis not present

## 2014-07-25 DIAGNOSIS — I69351 Hemiplegia and hemiparesis following cerebral infarction affecting right dominant side: Secondary | ICD-10-CM | POA: Diagnosis not present

## 2014-07-25 DIAGNOSIS — M47816 Spondylosis without myelopathy or radiculopathy, lumbar region: Secondary | ICD-10-CM | POA: Diagnosis not present

## 2014-07-25 DIAGNOSIS — G629 Polyneuropathy, unspecified: Secondary | ICD-10-CM | POA: Diagnosis not present

## 2014-07-25 DIAGNOSIS — R569 Unspecified convulsions: Secondary | ICD-10-CM | POA: Diagnosis not present

## 2014-07-25 DIAGNOSIS — I252 Old myocardial infarction: Secondary | ICD-10-CM | POA: Diagnosis not present

## 2014-07-25 DIAGNOSIS — G894 Chronic pain syndrome: Secondary | ICD-10-CM | POA: Diagnosis not present

## 2014-07-25 DIAGNOSIS — Z79891 Long term (current) use of opiate analgesic: Secondary | ICD-10-CM | POA: Diagnosis not present

## 2014-07-25 DIAGNOSIS — Z8701 Personal history of pneumonia (recurrent): Secondary | ICD-10-CM | POA: Diagnosis not present

## 2014-07-25 DIAGNOSIS — Z951 Presence of aortocoronary bypass graft: Secondary | ICD-10-CM | POA: Diagnosis not present

## 2014-07-25 DIAGNOSIS — Z87891 Personal history of nicotine dependence: Secondary | ICD-10-CM | POA: Diagnosis not present

## 2014-07-25 DIAGNOSIS — I2510 Atherosclerotic heart disease of native coronary artery without angina pectoris: Secondary | ICD-10-CM | POA: Diagnosis not present

## 2014-07-25 DIAGNOSIS — J441 Chronic obstructive pulmonary disease with (acute) exacerbation: Secondary | ICD-10-CM | POA: Diagnosis not present

## 2014-07-25 DIAGNOSIS — R52 Pain, unspecified: Secondary | ICD-10-CM | POA: Diagnosis not present

## 2014-07-26 DIAGNOSIS — I69351 Hemiplegia and hemiparesis following cerebral infarction affecting right dominant side: Secondary | ICD-10-CM | POA: Diagnosis not present

## 2014-07-26 DIAGNOSIS — Z951 Presence of aortocoronary bypass graft: Secondary | ICD-10-CM | POA: Diagnosis not present

## 2014-07-26 DIAGNOSIS — I252 Old myocardial infarction: Secondary | ICD-10-CM | POA: Diagnosis not present

## 2014-07-26 DIAGNOSIS — Z87891 Personal history of nicotine dependence: Secondary | ICD-10-CM | POA: Diagnosis not present

## 2014-07-26 DIAGNOSIS — Z8701 Personal history of pneumonia (recurrent): Secondary | ICD-10-CM | POA: Diagnosis not present

## 2014-07-26 DIAGNOSIS — I2510 Atherosclerotic heart disease of native coronary artery without angina pectoris: Secondary | ICD-10-CM | POA: Diagnosis not present

## 2014-07-26 DIAGNOSIS — R52 Pain, unspecified: Secondary | ICD-10-CM | POA: Diagnosis not present

## 2014-07-26 DIAGNOSIS — J441 Chronic obstructive pulmonary disease with (acute) exacerbation: Secondary | ICD-10-CM | POA: Diagnosis not present

## 2014-07-26 DIAGNOSIS — R569 Unspecified convulsions: Secondary | ICD-10-CM | POA: Diagnosis not present

## 2014-07-27 ENCOUNTER — Other Ambulatory Visit: Payer: Self-pay

## 2014-07-27 DIAGNOSIS — Z87891 Personal history of nicotine dependence: Secondary | ICD-10-CM | POA: Diagnosis not present

## 2014-07-27 DIAGNOSIS — I2510 Atherosclerotic heart disease of native coronary artery without angina pectoris: Secondary | ICD-10-CM | POA: Diagnosis not present

## 2014-07-27 DIAGNOSIS — I252 Old myocardial infarction: Secondary | ICD-10-CM | POA: Diagnosis not present

## 2014-07-27 DIAGNOSIS — R52 Pain, unspecified: Secondary | ICD-10-CM | POA: Diagnosis not present

## 2014-07-27 DIAGNOSIS — R569 Unspecified convulsions: Secondary | ICD-10-CM | POA: Diagnosis not present

## 2014-07-27 DIAGNOSIS — I69351 Hemiplegia and hemiparesis following cerebral infarction affecting right dominant side: Secondary | ICD-10-CM | POA: Diagnosis not present

## 2014-07-27 DIAGNOSIS — Z8701 Personal history of pneumonia (recurrent): Secondary | ICD-10-CM | POA: Diagnosis not present

## 2014-07-27 DIAGNOSIS — J441 Chronic obstructive pulmonary disease with (acute) exacerbation: Secondary | ICD-10-CM | POA: Diagnosis not present

## 2014-07-27 DIAGNOSIS — Z951 Presence of aortocoronary bypass graft: Secondary | ICD-10-CM | POA: Diagnosis not present

## 2014-07-27 MED ORDER — ISOSORBIDE MONONITRATE ER 120 MG PO TB24: 120.0000 mg | ORAL_TABLET | Freq: Every day | ORAL | Status: DC

## 2014-07-27 NOTE — Telephone Encounter (Signed)
Rx(s) sent to pharmacy electronically.  

## 2014-07-30 DIAGNOSIS — Z8701 Personal history of pneumonia (recurrent): Secondary | ICD-10-CM | POA: Diagnosis not present

## 2014-07-30 DIAGNOSIS — R569 Unspecified convulsions: Secondary | ICD-10-CM | POA: Diagnosis not present

## 2014-07-30 DIAGNOSIS — J441 Chronic obstructive pulmonary disease with (acute) exacerbation: Secondary | ICD-10-CM | POA: Diagnosis not present

## 2014-07-30 DIAGNOSIS — I2510 Atherosclerotic heart disease of native coronary artery without angina pectoris: Secondary | ICD-10-CM | POA: Diagnosis not present

## 2014-07-30 DIAGNOSIS — I69351 Hemiplegia and hemiparesis following cerebral infarction affecting right dominant side: Secondary | ICD-10-CM | POA: Diagnosis not present

## 2014-07-30 DIAGNOSIS — I252 Old myocardial infarction: Secondary | ICD-10-CM | POA: Diagnosis not present

## 2014-07-30 DIAGNOSIS — Z87891 Personal history of nicotine dependence: Secondary | ICD-10-CM | POA: Diagnosis not present

## 2014-07-30 DIAGNOSIS — Z951 Presence of aortocoronary bypass graft: Secondary | ICD-10-CM | POA: Diagnosis not present

## 2014-07-30 DIAGNOSIS — R52 Pain, unspecified: Secondary | ICD-10-CM | POA: Diagnosis not present

## 2014-08-02 DIAGNOSIS — J441 Chronic obstructive pulmonary disease with (acute) exacerbation: Secondary | ICD-10-CM | POA: Diagnosis not present

## 2014-08-02 DIAGNOSIS — I2510 Atherosclerotic heart disease of native coronary artery without angina pectoris: Secondary | ICD-10-CM | POA: Diagnosis not present

## 2014-08-02 DIAGNOSIS — Z8701 Personal history of pneumonia (recurrent): Secondary | ICD-10-CM | POA: Diagnosis not present

## 2014-08-02 DIAGNOSIS — R52 Pain, unspecified: Secondary | ICD-10-CM | POA: Diagnosis not present

## 2014-08-02 DIAGNOSIS — Z87891 Personal history of nicotine dependence: Secondary | ICD-10-CM | POA: Diagnosis not present

## 2014-08-02 DIAGNOSIS — I252 Old myocardial infarction: Secondary | ICD-10-CM | POA: Diagnosis not present

## 2014-08-02 DIAGNOSIS — R569 Unspecified convulsions: Secondary | ICD-10-CM | POA: Diagnosis not present

## 2014-08-02 DIAGNOSIS — Z951 Presence of aortocoronary bypass graft: Secondary | ICD-10-CM | POA: Diagnosis not present

## 2014-08-02 DIAGNOSIS — I69351 Hemiplegia and hemiparesis following cerebral infarction affecting right dominant side: Secondary | ICD-10-CM | POA: Diagnosis not present

## 2014-08-06 ENCOUNTER — Other Ambulatory Visit: Payer: Self-pay | Admitting: *Deleted

## 2014-08-06 MED ORDER — RANOLAZINE ER 1000 MG PO TB12: 1000.0000 mg | ORAL_TABLET | Freq: Two times a day (BID) | ORAL | Status: DC

## 2014-08-07 DIAGNOSIS — Z87891 Personal history of nicotine dependence: Secondary | ICD-10-CM | POA: Diagnosis not present

## 2014-08-07 DIAGNOSIS — I252 Old myocardial infarction: Secondary | ICD-10-CM | POA: Diagnosis not present

## 2014-08-07 DIAGNOSIS — R569 Unspecified convulsions: Secondary | ICD-10-CM | POA: Diagnosis not present

## 2014-08-07 DIAGNOSIS — Z951 Presence of aortocoronary bypass graft: Secondary | ICD-10-CM | POA: Diagnosis not present

## 2014-08-07 DIAGNOSIS — R52 Pain, unspecified: Secondary | ICD-10-CM | POA: Diagnosis not present

## 2014-08-07 DIAGNOSIS — Z8701 Personal history of pneumonia (recurrent): Secondary | ICD-10-CM | POA: Diagnosis not present

## 2014-08-07 DIAGNOSIS — I2510 Atherosclerotic heart disease of native coronary artery without angina pectoris: Secondary | ICD-10-CM | POA: Diagnosis not present

## 2014-08-07 DIAGNOSIS — J441 Chronic obstructive pulmonary disease with (acute) exacerbation: Secondary | ICD-10-CM | POA: Diagnosis not present

## 2014-08-07 DIAGNOSIS — I69351 Hemiplegia and hemiparesis following cerebral infarction affecting right dominant side: Secondary | ICD-10-CM | POA: Diagnosis not present

## 2014-08-10 ENCOUNTER — Other Ambulatory Visit: Payer: Self-pay

## 2014-08-10 MED ORDER — METOPROLOL TARTRATE 25 MG PO TABS: 12.5000 mg | ORAL_TABLET | Freq: Two times a day (BID) | ORAL | Status: DC

## 2014-08-10 NOTE — Telephone Encounter (Signed)
Rx(s) sent to pharmacy electronically.  

## 2014-08-22 DIAGNOSIS — Z79891 Long term (current) use of opiate analgesic: Secondary | ICD-10-CM | POA: Diagnosis not present

## 2014-08-22 DIAGNOSIS — M47816 Spondylosis without myelopathy or radiculopathy, lumbar region: Secondary | ICD-10-CM | POA: Diagnosis not present

## 2014-08-22 DIAGNOSIS — G629 Polyneuropathy, unspecified: Secondary | ICD-10-CM | POA: Diagnosis not present

## 2014-08-28 ENCOUNTER — Other Ambulatory Visit: Payer: Self-pay

## 2014-08-28 NOTE — Patient Outreach (Signed)
[facility] [place] [facility]) Care Management  08/28/2014  Chris Payne [birth year] [id number]  Telephone outreach to patient regarding [facility] referral.  Unable to reach patient.  Phone number not in service.  Called patients primary MD to confirm contact phone number.  Phone number confirmed [phone].  Patients emergency contact, Chris Payne called.  Unable to reach.  HIPAA compliant voice message left with call back phone number.   PLAN:  RNCM will attempt 2nd telephone outreach to patient within 3 business days.   [name] RN,BSN,CCM [place] Coordinator [phone]

## 2014-08-30 ENCOUNTER — Ambulatory Visit: Payer: Self-pay

## 2014-08-30 ENCOUNTER — Other Ambulatory Visit: Payer: Self-pay

## 2014-08-30 NOTE — Patient Outreach (Signed)
[place] [facility]) Care Management  08/30/2014  Chris Payne June 23, 1950 [id number]    Second telephone outreach to patient regarding [facility] referral.  Phone number listed [phone] (changed/disconnected). Number has been verified with patients patients primary MD office as well as from [facility] referral. Attempted to reach patient by emergency contact listed, [name].  Person answering phone stated "wrong number."    PLAN: RNCM will send outreach letter to attempt contact with patient.   [name] RN,BSN,CCM [place] Coordinator [phone]

## 2014-09-19 ENCOUNTER — Emergency Department (HOSPITAL_COMMUNITY): Payer: Commercial Managed Care - HMO

## 2014-09-19 ENCOUNTER — Emergency Department (HOSPITAL_COMMUNITY)
Admission: EM | Admit: 2014-09-19 | Discharge: 2014-09-20 | Disposition: A | Discharge status: Home / Self Care | Payer: Commercial Managed Care - HMO | Attending: Emergency Medicine | Admitting: Emergency Medicine

## 2014-09-19 ENCOUNTER — Encounter (HOSPITAL_COMMUNITY): Payer: Self-pay | Admitting: Emergency Medicine

## 2014-09-19 DIAGNOSIS — R0602 Shortness of breath: Secondary | ICD-10-CM | POA: Diagnosis not present

## 2014-09-19 DIAGNOSIS — Z8673 Personal history of transient ischemic attack (TIA), and cerebral infarction without residual deficits: Secondary | ICD-10-CM | POA: Insufficient documentation

## 2014-09-19 DIAGNOSIS — E785 Hyperlipidemia, unspecified: Secondary | ICD-10-CM | POA: Diagnosis not present

## 2014-09-19 DIAGNOSIS — Z9889 Other specified postprocedural states: Secondary | ICD-10-CM | POA: Diagnosis not present

## 2014-09-19 DIAGNOSIS — N390 Urinary tract infection, site not specified: Secondary | ICD-10-CM | POA: Insufficient documentation

## 2014-09-19 DIAGNOSIS — Z79891 Long term (current) use of opiate analgesic: Secondary | ICD-10-CM | POA: Diagnosis not present

## 2014-09-19 DIAGNOSIS — M47816 Spondylosis without myelopathy or radiculopathy, lumbar region: Secondary | ICD-10-CM | POA: Diagnosis not present

## 2014-09-19 DIAGNOSIS — Z7982 Long term (current) use of aspirin: Secondary | ICD-10-CM | POA: Insufficient documentation

## 2014-09-19 DIAGNOSIS — Z8719 Personal history of other diseases of the digestive system: Secondary | ICD-10-CM | POA: Diagnosis not present

## 2014-09-19 DIAGNOSIS — I252 Old myocardial infarction: Secondary | ICD-10-CM | POA: Diagnosis not present

## 2014-09-19 DIAGNOSIS — G629 Polyneuropathy, unspecified: Secondary | ICD-10-CM | POA: Diagnosis not present

## 2014-09-19 DIAGNOSIS — R600 Localized edema: Secondary | ICD-10-CM | POA: Diagnosis not present

## 2014-09-19 DIAGNOSIS — Z8701 Personal history of pneumonia (recurrent): Secondary | ICD-10-CM | POA: Diagnosis not present

## 2014-09-19 DIAGNOSIS — G894 Chronic pain syndrome: Secondary | ICD-10-CM | POA: Diagnosis not present

## 2014-09-19 DIAGNOSIS — I2510 Atherosclerotic heart disease of native coronary artery without angina pectoris: Secondary | ICD-10-CM | POA: Insufficient documentation

## 2014-09-19 DIAGNOSIS — Z951 Presence of aortocoronary bypass graft: Secondary | ICD-10-CM | POA: Diagnosis not present

## 2014-09-19 DIAGNOSIS — Z87891 Personal history of nicotine dependence: Secondary | ICD-10-CM | POA: Insufficient documentation

## 2014-09-19 DIAGNOSIS — M7989 Other specified soft tissue disorders: Secondary | ICD-10-CM | POA: Diagnosis present

## 2014-09-19 DIAGNOSIS — Z792 Long term (current) use of antibiotics: Secondary | ICD-10-CM | POA: Insufficient documentation

## 2014-09-19 DIAGNOSIS — Z79899 Other long term (current) drug therapy: Secondary | ICD-10-CM | POA: Insufficient documentation

## 2014-09-19 DIAGNOSIS — J441 Chronic obstructive pulmonary disease with (acute) exacerbation: Secondary | ICD-10-CM | POA: Insufficient documentation

## 2014-09-19 DIAGNOSIS — I10 Essential (primary) hypertension: Secondary | ICD-10-CM | POA: Diagnosis not present

## 2014-09-19 DIAGNOSIS — R609 Edema, unspecified: Secondary | ICD-10-CM

## 2014-09-19 LAB — BRAIN NATRIURETIC PEPTIDE: B Natriuretic Peptide: 51.3 pg/mL (ref 0.0–100.0)

## 2014-09-19 LAB — CBC WITH DIFFERENTIAL/PLATELET
Basophils Absolute: 0 10*3/uL (ref 0.0–0.1)
Basophils Relative: 0 % (ref 0–1)
Eosinophils Absolute: 0.1 10*3/uL (ref 0.0–0.7)
Eosinophils Relative: 1 % (ref 0–5)
HEMATOCRIT: 44.4 % (ref 39.0–52.0)
Hemoglobin: 15 g/dL (ref 13.0–17.0)
LYMPHS ABS: 1.6 10*3/uL (ref 0.7–4.0)
Lymphocytes Relative: 24 % (ref 12–46)
MCH: 34.2 pg — ABNORMAL HIGH (ref 26.0–34.0)
MCHC: 33.8 g/dL (ref 30.0–36.0)
MCV: 101.1 fL — AB (ref 78.0–100.0)
Monocytes Absolute: 0.6 10*3/uL (ref 0.1–1.0)
Monocytes Relative: 9 % (ref 3–12)
Neutro Abs: 4.3 10*3/uL (ref 1.7–7.7)
Neutrophils Relative %: 66 % (ref 43–77)
Platelets: 121 10*3/uL — ABNORMAL LOW (ref 150–400)
RBC: 4.39 MIL/uL (ref 4.22–5.81)
RDW: 12.6 % (ref 11.5–15.5)
WBC: 6.6 10*3/uL (ref 4.0–10.5)

## 2014-09-19 LAB — BASIC METABOLIC PANEL
ANION GAP: 10 (ref 5–15)
BUN: 15 mg/dL (ref 6–20)
CO2: 23 mmol/L (ref 22–32)
CREATININE: 0.92 mg/dL (ref 0.61–1.24)
Calcium: 8.9 mg/dL (ref 8.9–10.3)
Chloride: 102 mmol/L (ref 101–111)
Glucose, Bld: 109 mg/dL — ABNORMAL HIGH (ref 65–99)
Potassium: 4 mmol/L (ref 3.5–5.1)
SODIUM: 135 mmol/L (ref 135–145)

## 2014-09-19 LAB — I-STAT TROPONIN, ED: Troponin i, poc: 0.01 ng/mL (ref 0.00–0.08)

## 2014-09-19 NOTE — ED Notes (Signed)
Pt sts bilateral leg swelling and some SOB x several weeks

## 2014-09-19 NOTE — ED Provider Notes (Signed)
CSN: [id number]     Arrival date & time 09/19/14  1600 History   First MD Initiated Contact with Patient 09/19/14 2224     Chief Complaint  Patient presents with  . Leg Swelling  . Shortness of Breath     (Consider location/radiation/quality/duration/timing/severity/associated sxs/prior Treatment) HPI  Chris Payne is a 64 y.o. male complaining of increasing bilateral peripheral edema worsening over the last 4 days. Patient has been using compression stockings with little relief. He is not taking any diarrhea to experience patient has cough which has been constant for several months. He has intermittent, fleeting chest pain and shortness of breath that is not tied to exertion. These are not worsened over the last week. Patient denies fever, chills, orthopnea, PND, dyspnea on exertion .  denies pain but states he has a turning sensation in the bilateral feet. On review of systems patient also notes a intermittent burning sensation when he urinates, states that this been happening over the course of last 4 weeks.  Past Medical History  Diagnosis Date  . Hypertension   . Stroke     h/o pontine CVA  . AAA (abdominal aortic aneurysm)     s/p repair 6/11  . Hyperlipidemia   . CAD (coronary artery disease)     s/p CABG 2/12:  LIMA to LAD, SVG to diagonal-1, SVG to ramus intermedius,, SVG to AM (Dr. Roxan Hockey) ;  b.  Myoview 10/13:  inf infarct with very mild peri-infarct ischemia, EF 49%, inf HK; c  He has severe three-vessel native coronary artery disease. Catheterization March 2015 as above  . Carotid stenosis     a.  dopplers 6/27: LICA 03-50%;  b. Carotid dopplers 3/14:  R 0-39%, L 60-79%, repeat 6 mos  . PAD (peripheral artery disease)     ABIs 4/12:  R 0.88, L 0.92; R SFA 40%, L CFA 50%, L SFA 50-60%  . COPD (chronic obstructive pulmonary disease)   . Inguinal hernia   . Thyroid nodule     incidental finding on carotid doppler 3/14 => thyroid U/S ordered  . Pneumonia     hx  .  GERD (gastroesophageal reflux disease)   . Myocardial infarction     PCI x3  . KXFGHWEX(937.1)    Past Surgical History  Procedure Laterality Date  . Hip surgery  40 years ago    left hip bone removal and pinning  . Abdominal aortic aneurysm repair  10-03-2009  . Exploratory laparotomy  09/2009    ligation of lumbar arteries  . Coronary artery bypass graft  06/24/2010    LIMA to the LAD, SVG to first diagonal, SVG to ramus intermediate, SVG to acute marginal. EF 50%. 2/12  . External ear surgery Left     laceration child  . Inguinal hernia repair Left 07/21/2012    Procedure: HERNIA REPAIR INGUINAL ADULT;  Surgeon: Merrie Roof, MD;  Location: [place];  Service: General;  Laterality: Left;  . Insertion of mesh Left 07/21/2012    Procedure: INSERTION OF MESH;  Surgeon: Merrie Roof, MD;  Location: [place];  Service: General;  Laterality: Left;  . Exploration post operative open heart    . Nm myocar perf wall motion  12/02/2010    inferoapical defect is fixed c/w prior infarction/scar, there is minimal borderzone ischemia.  . Left heart catheterization with coronary/graft angiogram  06/21/2012    Procedure: LEFT HEART CATHETERIZATION WITH Beatrix Fetters;  Surgeon: Minus Breeding, MD;  Location:  [place] CATH LAB;  Service: Cardiovascular;;  . Left heart catheterization with coronary/graft angiogram N/A 07/04/2013    Procedure: LEFT HEART CATHETERIZATION WITH Beatrix Fetters;  Surgeon: Sinclair Grooms, MD;  Location: [facility] CATH LAB;  Service: Cardiovascular;  Laterality: N/A;   Family History  Problem Relation Age of Onset  . Alcohol abuse Father   . Cancer Sister    History  Substance Use Topics  . Smoking status: Former Smoker -- 1.00 packs/day for 30 years    Types: Cigarettes    Quit date: 01/25/2010  . Smokeless tobacco: Never Used  . Alcohol Use: No     Comment: former drinker - Quit 2011.    Review of Systems  10 systems reviewed and found to be negative, except  as noted in the HPI.   Allergies  Review of patient's allergies indicates no known allergies.  Home Medications   Prior to Admission medications   Medication Sig Start Date End Date Taking? Authorizing Provider  aspirin EC 81 MG tablet Take 81 mg by mouth every morning.    Yes Historical Provider, MD  B Complex-Folic Acid (B COMPLEX-VITAMIN B12) TABS Take 1 tablet by mouth daily. 11/26/13  Yes Barton Fanny, MD  HYDROcodone-acetaminophen [facility]) 10-325 MG per tablet Take 2 tablets by mouth 4 (four) times daily.    Yes Historical Provider, MD  isosorbide mononitrate (IMDUR) 120 MG 24 hr tablet Take 1 tablet (120 mg total) by mouth daily. 07/27/14  Yes Minus Breeding, MD  levETIRAcetam (KEPPRA) 500 MG tablet Take 500 mg by mouth 2 (two) times daily.   Yes Historical Provider, MD  metoprolol tartrate (LOPRESSOR) 25 MG tablet Take 0.5 tablets (12.5 mg total) by mouth 2 (two) times daily. 08/10/14  Yes Minus Breeding, MD  morphine (MS CONTIN) 30 MG 12 hr tablet Take 30 mg by mouth every 12 (twelve) hours.  07/07/13  Yes Historical Provider, MD  nitroGLYCERIN (NITROSTAT) 0.4 MG SL tablet Place 0.4 mg under the tongue every 5 (five) minutes as needed for chest pain.  06/01/13  Yes Minus Breeding, MD  ranolazine (RANEXA) 1000 MG SR tablet Take 1 tablet (1,000 mg total) by mouth 2 (two) times daily. 08/06/14  Yes Minus Breeding, MD  tamsulosin (FLOMAX) 0.4 MG CAPS capsule Take 0.4 mg by mouth at bedtime.    Yes Historical Provider, MD  carvedilol (COREG) 3.125 MG tablet Take 1 tablet (3.125 mg total) by mouth daily with breakfast. Patient not taking: Reported on 09/19/2014 10/26/13   Minus Breeding, MD  cephALEXin (KEFLEX) 500 MG capsule Take 1 capsule (500 mg total) by mouth 4 (four) times daily. 09/20/14   Rajiv Parlato, PA-C  clopidogrel (PLAVIX) 75 MG tablet Take 1 tablet (75 mg total) by mouth daily with breakfast. Patient not taking: Reported on 09/19/2014 09/23/13   Bernadene Bell, MD  furosemide  (LASIX) 20 MG tablet Take 1 tablet (20 mg total) by mouth daily. 09/20/14   Lamees Gable, PA-C  gabapentin (NEURONTIN) 100 MG capsule TAKE 1 CAPSULE BY MOUTH EACH MORNING AND2 CAPSULES AT BEDTIME AS DIRECTED Patient not taking: Reported on 09/19/2014 02/21/14   Barton Fanny, MD  metoprolol succinate (TOPROL-XL) 25 MG 24 hr tablet Take 12.5 mg by mouth 2 (two) times daily.    Historical Provider, MD  rosuvastatin (CRESTOR) 40 MG tablet Take 1 tablet (40 mg total) by mouth daily. Patient not taking: Reported on 09/19/2014 06/18/14   Minus Breeding, MD   BP 118/71 mmHg  Pulse 58  Temp(Src) 98.1 F (36.7 C) (Oral)  Resp 10  SpO2 96% Physical Exam  Constitutional: He is oriented to person, place, and time. He appears well-developed and well-nourished. No distress.  HENT:  Head: Normocephalic and atraumatic.  Mouth/Throat: Oropharynx is clear and moist.  Eyes: Conjunctivae and EOM are normal. Pupils are equal, round, and reactive to light.  Neck: Normal range of motion.  Cardiovascular: Normal rate, regular rhythm and intact distal pulses.   Pulmonary/Chest: Effort normal and breath sounds normal. No stridor. No respiratory distress. He has no wheezes. He has no rales. He exhibits no tenderness.  He creased her movement in all fields especially at the bases, no wheezing  Abdominal: Soft. Bowel sounds are normal. He exhibits no distension and no mass. There is no tenderness. There is no rebound and no guarding.  Genitourinary:  No CVA tenderness to palpation bilaterally  Musculoskeletal: Normal range of motion. He exhibits edema.  3+ pitting edema to proximal shin  Neurological: He is alert and oriented to person, place, and time.  Psychiatric: He has a normal mood and affect.  Nursing note and vitals reviewed.   ED Course  Procedures (including critical care time) Labs Review Labs Reviewed  BASIC METABOLIC PANEL - Abnormal; Notable for the following:    Glucose, Bld 109 (*)     All other components within normal limits  CBC WITH DIFFERENTIAL/PLATELET - Abnormal; Notable for the following:    MCV 101.1 (*)    MCH 34.2 (*)    Platelets 121 (*)    All other components within normal limits  URINALYSIS, ROUTINE W REFLEX MICROSCOPIC (NOT AT [facility]) - Abnormal; Notable for the following:    Color, Urine RED (*)    APPearance CLOUDY (*)    Hgb urine dipstick LARGE (*)    Bilirubin Urine SMALL (*)    Ketones, ur 15 (*)    Protein, ur 30 (*)    Nitrite POSITIVE (*)    Leukocytes, UA SMALL (*)    All other components within normal limits  URINE CULTURE  BRAIN NATRIURETIC PEPTIDE  URINE MICROSCOPIC-ADD ON  Randolm Idol, ED    Imaging Review Dg Chest 2 View  09/19/2014   CLINICAL DATA:  Shortness of breath and lower extremity edema for 2 months  EXAM: CHEST  2 VIEW  COMPARISON:  June 24, 2014  FINDINGS: There is patchy atelectasis in the left base. Lungs elsewhere clear. Heart size and pulmonary vascularity are normal. No adenopathy. Patient is status post coronary artery bypass grafting. No bone lesions.  IMPRESSION: Patchy atelectasis left base.  Lungs elsewhere clear.   Electronically Signed   By: Lowella Grip III M.D.   On: 09/19/2014 17:34     EKG Interpretation None      MDM   Final diagnoses:  Peripheral edema  UTI (lower urinary tract infection)    Filed Vitals:   09/19/14 2330 09/19/14 2345 09/20/14 0000 09/20/14 0015  BP: 131/67 123/76 135/64 118/71  Pulse: 58 60 58 58  Temp:      TempSrc:      Resp: 9 10 10 10   SpO2: 95% 95% 95% 96%    Chris Payne is a pleasant 64 y.o. male presenting with increasing peripheral edema over the last 4 days, no chest pain or significant shortness of breath, dyspnea on exertion, doubt this is CHF exacerbation. EKG is nonischemic, BNP is negative, troponin is also normal. Plan to start Lasix 20 mg daily, recommend taking first dose in  the a.m. tomorrow. Patient's urine is very dark, is positive  nitrite small leukocytes. Urine culture is ordered, no signs of systemic infection and no CVA tenderness to suggest a pyelonephritis. Patient will be started on Rocephin 1 g IM and Keflex by mouth. I've advised him he'll need to follow with his primary care Dr. for recheck of his urine to ensure the infection is eradicated. Patient verbalized his understanding.  Evaluation does not show pathology that would require ongoing emergent intervention or inpatient treatment. Pt is hemodynamically stable and mentating appropriately. Discussed findings and plan with patient/guardian, who agrees with care plan. All questions answered. Return precautions discussed and outpatient follow up given.   New Prescriptions   CEPHALEXIN (KEFLEX) 500 MG CAPSULE    Take 1 capsule (500 mg total) by mouth 4 (four) times daily.   FUROSEMIDE (LASIX) 20 MG TABLET    Take 1 tablet (20 mg total) by mouth daily.         Elmyra Ricks Johntay Doolen, PA-C 09/20/14 6384  Ernestina Patches, MD 09/20/14 [phone]

## 2014-09-20 DIAGNOSIS — N390 Urinary tract infection, site not specified: Secondary | ICD-10-CM | POA: Diagnosis not present

## 2014-09-20 DIAGNOSIS — I2510 Atherosclerotic heart disease of native coronary artery without angina pectoris: Secondary | ICD-10-CM | POA: Diagnosis not present

## 2014-09-20 DIAGNOSIS — Z792 Long term (current) use of antibiotics: Secondary | ICD-10-CM | POA: Diagnosis not present

## 2014-09-20 DIAGNOSIS — Z7982 Long term (current) use of aspirin: Secondary | ICD-10-CM | POA: Diagnosis not present

## 2014-09-20 DIAGNOSIS — E785 Hyperlipidemia, unspecified: Secondary | ICD-10-CM | POA: Diagnosis not present

## 2014-09-20 DIAGNOSIS — I252 Old myocardial infarction: Secondary | ICD-10-CM | POA: Diagnosis not present

## 2014-09-20 DIAGNOSIS — R600 Localized edema: Secondary | ICD-10-CM | POA: Diagnosis not present

## 2014-09-20 DIAGNOSIS — I10 Essential (primary) hypertension: Secondary | ICD-10-CM | POA: Diagnosis not present

## 2014-09-20 DIAGNOSIS — J441 Chronic obstructive pulmonary disease with (acute) exacerbation: Secondary | ICD-10-CM | POA: Diagnosis not present

## 2014-09-20 LAB — URINALYSIS, ROUTINE W REFLEX MICROSCOPIC
Glucose, UA: NEGATIVE mg/dL
KETONES UR: 15 mg/dL — AB
Nitrite: POSITIVE — AB
Protein, ur: 30 mg/dL — AB
Specific Gravity, Urine: 1.025 (ref 1.005–1.030)
Urobilinogen, UA: 1 mg/dL (ref 0.0–1.0)
pH: 5 (ref 5.0–8.0)

## 2014-09-20 LAB — URINE MICROSCOPIC-ADD ON

## 2014-09-20 MED ORDER — CEPHALEXIN 500 MG PO CAPS: 500.0000 mg | ORAL_CAPSULE | Freq: Four times a day (QID) | ORAL | Status: DC

## 2014-09-20 MED ORDER — CEFTRIAXONE SODIUM 1 G IJ SOLR
1.0000 g | Freq: Once | INTRAMUSCULAR | Status: AC
  Administered 2014-09-20: 1 g via INTRAMUSCULAR
  Filled 2014-09-20: qty 10

## 2014-09-20 MED ORDER — FUROSEMIDE 20 MG PO TABS: 20.0000 mg | ORAL_TABLET | Freq: Every day | ORAL | Status: DC

## 2014-09-20 MED ORDER — LIDOCAINE HCL (PF) 1 % IJ SOLN
5.0000 mL | Freq: Once | INTRAMUSCULAR | Status: AC
  Administered 2014-09-20: 5 mL via INTRADERMAL
  Filled 2014-09-20: qty 5

## 2014-09-20 NOTE — Discharge Instructions (Signed)
Elevate the legs and use her compression stockings. Follow-up with your primary care physician and her cardiologist in the next week.   Do not hesitate to return to the ED for any new, worsening or concerning symptoms.  Take your antibiotics as directed and to completion. You should never have any leftover antibiotics! Push fluids and stay well hydrated.    Peripheral Edema You have swelling in your legs (peripheral edema). This swelling is due to excess accumulation of salt and water in your body. Edema may be a sign of heart, kidney or liver disease, or a side effect of a medication. It may also be due to problems in the leg veins. Elevating your legs and using special support stockings may be very helpful, if the cause of the swelling is due to poor venous circulation. Avoid long periods of standing, whatever the cause. Treatment of edema depends on identifying the cause. Chips, pretzels, pickles and other salty foods should be avoided. Restricting salt in your diet is almost always needed. Water pills (diuretics) are often used to remove the excess salt and water from your body via urine. These medicines prevent the kidney from reabsorbing sodium. This increases urine flow. Diuretic treatment may also result in lowering of potassium levels in your body. Potassium supplements may be needed if you have to use diuretics daily. Daily weights can help you keep track of your progress in clearing your edema. You should call your caregiver for follow up care as recommended. SEEK IMMEDIATE MEDICAL CARE IF:   You have increased swelling, pain, redness, or heat in your legs.  You develop shortness of breath, especially when lying down.  You develop chest or abdominal pain, weakness, or fainting.  You have a fever. Document Released: 05/21/2004 Document Revised: 07/06/2011 Document Reviewed: 05/01/2009 [facility] Patient Information 2015 [place], [place]. This information is not intended to replace advice  given to you by your health care provider. Make sure you discuss any questions you have with your health care provider.

## 2014-09-20 NOTE — ED Notes (Signed)
PA at bedside.

## 2014-09-21 LAB — URINE CULTURE: Colony Count: 7000

## 2014-10-03 NOTE — Patient Outreach (Signed)
[facility]) Care Management  10/03/2014  Chris Payne 20-Oct-1950 [id number]   No response from patient to outreach letter.  Contact number listed has been disconnected.    PLAN: RNCM will refer patient to [name] to close due to inability to establish contact with patient. RNCM will notify patients primary MD office of inability to establish contact with patient.   [name] RN,BSN,CCM [place] Coordinator [phone]

## 2014-10-08 NOTE — Patient Outreach (Signed)
[place] [facility]) Care Management  10/08/2014  Chris Payne 21-Feb-1951 [id number]   Notification from [name], RN to close case due to inability to contact patient.  [name]. [name], [place] Management [place] Assistant Phone: [phone] Fax: [phone]

## 2014-10-11 DIAGNOSIS — R40242 Glasgow coma scale score 9-12: Secondary | ICD-10-CM | POA: Diagnosis not present

## 2014-10-12 DIAGNOSIS — G40909 Epilepsy, unspecified, not intractable, without status epilepticus: Secondary | ICD-10-CM | POA: Insufficient documentation

## 2014-10-12 DIAGNOSIS — N401 Enlarged prostate with lower urinary tract symptoms: Secondary | ICD-10-CM | POA: Diagnosis not present

## 2014-10-12 DIAGNOSIS — E785 Hyperlipidemia, unspecified: Secondary | ICD-10-CM | POA: Diagnosis not present

## 2014-10-12 DIAGNOSIS — I25709 Atherosclerosis of coronary artery bypass graft(s), unspecified, with unspecified angina pectoris: Secondary | ICD-10-CM | POA: Diagnosis not present

## 2014-10-12 DIAGNOSIS — R4182 Altered mental status, unspecified: Secondary | ICD-10-CM | POA: Diagnosis not present

## 2014-10-12 DIAGNOSIS — J8410 Pulmonary fibrosis, unspecified: Secondary | ICD-10-CM | POA: Diagnosis not present

## 2014-10-12 DIAGNOSIS — R001 Bradycardia, unspecified: Secondary | ICD-10-CM | POA: Diagnosis not present

## 2014-10-12 DIAGNOSIS — I25119 Atherosclerotic heart disease of native coronary artery with unspecified angina pectoris: Secondary | ICD-10-CM | POA: Diagnosis not present

## 2014-10-12 DIAGNOSIS — J439 Emphysema, unspecified: Secondary | ICD-10-CM | POA: Diagnosis not present

## 2014-10-12 DIAGNOSIS — G319 Degenerative disease of nervous system, unspecified: Secondary | ICD-10-CM | POA: Diagnosis not present

## 2014-10-12 DIAGNOSIS — R079 Chest pain, unspecified: Secondary | ICD-10-CM | POA: Diagnosis not present

## 2014-10-13 DIAGNOSIS — I25119 Atherosclerotic heart disease of native coronary artery with unspecified angina pectoris: Secondary | ICD-10-CM | POA: Diagnosis not present

## 2014-10-13 DIAGNOSIS — R4182 Altered mental status, unspecified: Secondary | ICD-10-CM | POA: Diagnosis not present

## 2014-10-13 DIAGNOSIS — J439 Emphysema, unspecified: Secondary | ICD-10-CM | POA: Diagnosis not present

## 2014-10-13 DIAGNOSIS — R079 Chest pain, unspecified: Secondary | ICD-10-CM | POA: Diagnosis not present

## 2014-10-13 DIAGNOSIS — I25709 Atherosclerosis of coronary artery bypass graft(s), unspecified, with unspecified angina pectoris: Secondary | ICD-10-CM | POA: Diagnosis not present

## 2014-10-13 DIAGNOSIS — R001 Bradycardia, unspecified: Secondary | ICD-10-CM | POA: Diagnosis not present

## 2014-10-13 DIAGNOSIS — E785 Hyperlipidemia, unspecified: Secondary | ICD-10-CM | POA: Diagnosis not present

## 2014-10-13 DIAGNOSIS — G319 Degenerative disease of nervous system, unspecified: Secondary | ICD-10-CM | POA: Diagnosis not present

## 2014-10-13 DIAGNOSIS — N401 Enlarged prostate with lower urinary tract symptoms: Secondary | ICD-10-CM | POA: Diagnosis not present

## 2014-10-13 DIAGNOSIS — G40909 Epilepsy, unspecified, not intractable, without status epilepticus: Secondary | ICD-10-CM | POA: Diagnosis not present

## 2014-10-14 DIAGNOSIS — R079 Chest pain, unspecified: Secondary | ICD-10-CM | POA: Diagnosis not present

## 2014-10-14 DIAGNOSIS — N401 Enlarged prostate with lower urinary tract symptoms: Secondary | ICD-10-CM | POA: Diagnosis not present

## 2014-10-14 DIAGNOSIS — J439 Emphysema, unspecified: Secondary | ICD-10-CM | POA: Diagnosis not present

## 2014-10-14 DIAGNOSIS — G40909 Epilepsy, unspecified, not intractable, without status epilepticus: Secondary | ICD-10-CM | POA: Diagnosis not present

## 2014-10-14 DIAGNOSIS — G319 Degenerative disease of nervous system, unspecified: Secondary | ICD-10-CM | POA: Diagnosis not present

## 2014-10-14 DIAGNOSIS — R001 Bradycardia, unspecified: Secondary | ICD-10-CM | POA: Diagnosis not present

## 2014-10-14 DIAGNOSIS — K5900 Constipation, unspecified: Secondary | ICD-10-CM | POA: Diagnosis not present

## 2014-10-14 DIAGNOSIS — I25709 Atherosclerosis of coronary artery bypass graft(s), unspecified, with unspecified angina pectoris: Secondary | ICD-10-CM | POA: Diagnosis not present

## 2014-10-14 DIAGNOSIS — R4182 Altered mental status, unspecified: Secondary | ICD-10-CM | POA: Diagnosis not present

## 2014-10-14 DIAGNOSIS — I25119 Atherosclerotic heart disease of native coronary artery with unspecified angina pectoris: Secondary | ICD-10-CM | POA: Diagnosis not present

## 2014-10-14 DIAGNOSIS — E785 Hyperlipidemia, unspecified: Secondary | ICD-10-CM | POA: Diagnosis not present

## 2014-10-15 DIAGNOSIS — K5900 Constipation, unspecified: Secondary | ICD-10-CM | POA: Diagnosis not present

## 2014-10-15 DIAGNOSIS — J439 Emphysema, unspecified: Secondary | ICD-10-CM | POA: Diagnosis not present

## 2014-10-15 DIAGNOSIS — R001 Bradycardia, unspecified: Secondary | ICD-10-CM | POA: Diagnosis not present

## 2014-10-15 DIAGNOSIS — G40909 Epilepsy, unspecified, not intractable, without status epilepticus: Secondary | ICD-10-CM | POA: Diagnosis not present

## 2014-10-15 DIAGNOSIS — I25709 Atherosclerosis of coronary artery bypass graft(s), unspecified, with unspecified angina pectoris: Secondary | ICD-10-CM | POA: Diagnosis not present

## 2014-10-15 DIAGNOSIS — I25119 Atherosclerotic heart disease of native coronary artery with unspecified angina pectoris: Secondary | ICD-10-CM | POA: Diagnosis not present

## 2014-10-15 DIAGNOSIS — N401 Enlarged prostate with lower urinary tract symptoms: Secondary | ICD-10-CM | POA: Diagnosis not present

## 2014-10-15 DIAGNOSIS — E785 Hyperlipidemia, unspecified: Secondary | ICD-10-CM | POA: Diagnosis not present

## 2014-10-15 DIAGNOSIS — R079 Chest pain, unspecified: Secondary | ICD-10-CM | POA: Diagnosis not present

## 2014-10-15 DIAGNOSIS — G319 Degenerative disease of nervous system, unspecified: Secondary | ICD-10-CM | POA: Diagnosis not present

## 2014-10-15 DIAGNOSIS — R4182 Altered mental status, unspecified: Secondary | ICD-10-CM | POA: Diagnosis not present

## 2014-10-16 DIAGNOSIS — J439 Emphysema, unspecified: Secondary | ICD-10-CM | POA: Diagnosis not present

## 2014-10-16 DIAGNOSIS — R001 Bradycardia, unspecified: Secondary | ICD-10-CM | POA: Diagnosis not present

## 2014-10-16 DIAGNOSIS — I209 Angina pectoris, unspecified: Secondary | ICD-10-CM | POA: Diagnosis not present

## 2014-10-16 DIAGNOSIS — R079 Chest pain, unspecified: Secondary | ICD-10-CM | POA: Diagnosis not present

## 2014-10-16 DIAGNOSIS — R0789 Other chest pain: Secondary | ICD-10-CM | POA: Diagnosis not present

## 2014-10-16 DIAGNOSIS — G319 Degenerative disease of nervous system, unspecified: Secondary | ICD-10-CM | POA: Diagnosis not present

## 2014-10-16 DIAGNOSIS — G40909 Epilepsy, unspecified, not intractable, without status epilepticus: Secondary | ICD-10-CM | POA: Diagnosis not present

## 2014-10-16 DIAGNOSIS — N401 Enlarged prostate with lower urinary tract symptoms: Secondary | ICD-10-CM | POA: Diagnosis not present

## 2014-10-16 DIAGNOSIS — R4182 Altered mental status, unspecified: Secondary | ICD-10-CM | POA: Diagnosis not present

## 2014-10-16 DIAGNOSIS — E785 Hyperlipidemia, unspecified: Secondary | ICD-10-CM | POA: Diagnosis not present

## 2014-10-16 DIAGNOSIS — I25119 Atherosclerotic heart disease of native coronary artery with unspecified angina pectoris: Secondary | ICD-10-CM | POA: Diagnosis not present

## 2014-10-16 DIAGNOSIS — I259 Chronic ischemic heart disease, unspecified: Secondary | ICD-10-CM | POA: Diagnosis not present

## 2014-10-19 DIAGNOSIS — Z79891 Long term (current) use of opiate analgesic: Secondary | ICD-10-CM | POA: Diagnosis not present

## 2014-10-19 DIAGNOSIS — G629 Polyneuropathy, unspecified: Secondary | ICD-10-CM | POA: Diagnosis not present

## 2014-10-19 DIAGNOSIS — G894 Chronic pain syndrome: Secondary | ICD-10-CM | POA: Diagnosis not present

## 2014-10-19 DIAGNOSIS — M47816 Spondylosis without myelopathy or radiculopathy, lumbar region: Secondary | ICD-10-CM | POA: Diagnosis not present

## 2014-10-25 DIAGNOSIS — M9904 Segmental and somatic dysfunction of sacral region: Secondary | ICD-10-CM | POA: Diagnosis not present

## 2014-10-25 DIAGNOSIS — G542 Cervical root disorders, not elsewhere classified: Secondary | ICD-10-CM | POA: Diagnosis not present

## 2014-10-25 DIAGNOSIS — M9905 Segmental and somatic dysfunction of pelvic region: Secondary | ICD-10-CM | POA: Diagnosis not present

## 2014-10-25 DIAGNOSIS — M9903 Segmental and somatic dysfunction of lumbar region: Secondary | ICD-10-CM | POA: Diagnosis not present

## 2014-11-16 DIAGNOSIS — Z79891 Long term (current) use of opiate analgesic: Secondary | ICD-10-CM | POA: Diagnosis not present

## 2014-11-16 DIAGNOSIS — G894 Chronic pain syndrome: Secondary | ICD-10-CM | POA: Diagnosis not present

## 2014-11-16 DIAGNOSIS — G629 Polyneuropathy, unspecified: Secondary | ICD-10-CM | POA: Diagnosis not present

## 2014-11-16 DIAGNOSIS — M47816 Spondylosis without myelopathy or radiculopathy, lumbar region: Secondary | ICD-10-CM | POA: Diagnosis not present

## 2015-01-23 ENCOUNTER — Telehealth: Payer: Self-pay | Admitting: Family Medicine

## 2015-01-23 NOTE — Telephone Encounter (Signed)
Spoke with patient and set him up to see Dr. Tamala Julian on 02/08/15 at 11:15.

## 2015-01-28 DIAGNOSIS — M4696 Unspecified inflammatory spondylopathy, lumbar region: Secondary | ICD-10-CM | POA: Diagnosis not present

## 2015-01-31 ENCOUNTER — Ambulatory Visit: Payer: Self-pay | Admitting: Physician Assistant

## 2015-02-08 ENCOUNTER — Ambulatory Visit (INDEPENDENT_AMBULATORY_CARE_PROVIDER_SITE_OTHER): Payer: Medicare Other | Admitting: Family Medicine

## 2015-02-08 ENCOUNTER — Emergency Department (HOSPITAL_COMMUNITY)
Admission: EM | Admit: 2015-02-08 | Discharge: 2015-02-08 | Disposition: A | Discharge status: Home / Self Care | Payer: Medicare Other | Attending: Emergency Medicine | Admitting: Emergency Medicine

## 2015-02-08 ENCOUNTER — Encounter: Payer: Self-pay | Admitting: Family Medicine

## 2015-02-08 ENCOUNTER — Emergency Department (HOSPITAL_COMMUNITY): Payer: Medicare Other

## 2015-02-08 ENCOUNTER — Encounter (HOSPITAL_COMMUNITY): Payer: Self-pay | Admitting: Family Medicine

## 2015-02-08 VITALS — BP 80/40 | HR 89 | Temp 98.0°F | Resp 16 | Ht 65.5 in | Wt 148.2 lb

## 2015-02-08 DIAGNOSIS — J449 Chronic obstructive pulmonary disease, unspecified: Secondary | ICD-10-CM | POA: Insufficient documentation

## 2015-02-08 DIAGNOSIS — Z79891 Long term (current) use of opiate analgesic: Secondary | ICD-10-CM | POA: Insufficient documentation

## 2015-02-08 DIAGNOSIS — G8929 Other chronic pain: Secondary | ICD-10-CM | POA: Insufficient documentation

## 2015-02-08 DIAGNOSIS — Z8673 Personal history of transient ischemic attack (TIA), and cerebral infarction without residual deficits: Secondary | ICD-10-CM

## 2015-02-08 DIAGNOSIS — R42 Dizziness and giddiness: Secondary | ICD-10-CM | POA: Insufficient documentation

## 2015-02-08 DIAGNOSIS — Z7982 Long term (current) use of aspirin: Secondary | ICD-10-CM | POA: Insufficient documentation

## 2015-02-08 DIAGNOSIS — E079 Disorder of thyroid, unspecified: Secondary | ICD-10-CM | POA: Insufficient documentation

## 2015-02-08 DIAGNOSIS — I959 Hypotension, unspecified: Secondary | ICD-10-CM | POA: Diagnosis not present

## 2015-02-08 DIAGNOSIS — Z951 Presence of aortocoronary bypass graft: Secondary | ICD-10-CM | POA: Insufficient documentation

## 2015-02-08 DIAGNOSIS — R05 Cough: Secondary | ICD-10-CM | POA: Insufficient documentation

## 2015-02-08 DIAGNOSIS — Z9889 Other specified postprocedural states: Secondary | ICD-10-CM | POA: Insufficient documentation

## 2015-02-08 DIAGNOSIS — Z7901 Long term (current) use of anticoagulants: Secondary | ICD-10-CM | POA: Diagnosis not present

## 2015-02-08 DIAGNOSIS — M25562 Pain in left knee: Secondary | ICD-10-CM | POA: Diagnosis not present

## 2015-02-08 DIAGNOSIS — I951 Orthostatic hypotension: Secondary | ICD-10-CM

## 2015-02-08 DIAGNOSIS — Z8701 Personal history of pneumonia (recurrent): Secondary | ICD-10-CM | POA: Diagnosis not present

## 2015-02-08 DIAGNOSIS — I2510 Atherosclerotic heart disease of native coronary artery without angina pectoris: Secondary | ICD-10-CM | POA: Insufficient documentation

## 2015-02-08 DIAGNOSIS — Z8719 Personal history of other diseases of the digestive system: Secondary | ICD-10-CM | POA: Insufficient documentation

## 2015-02-08 DIAGNOSIS — I252 Old myocardial infarction: Secondary | ICD-10-CM | POA: Insufficient documentation

## 2015-02-08 DIAGNOSIS — Z8679 Personal history of other diseases of the circulatory system: Secondary | ICD-10-CM

## 2015-02-08 DIAGNOSIS — Z79899 Other long term (current) drug therapy: Secondary | ICD-10-CM | POA: Diagnosis not present

## 2015-02-08 DIAGNOSIS — B952 Enterococcus as the cause of diseases classified elsewhere: Secondary | ICD-10-CM | POA: Diagnosis not present

## 2015-02-08 DIAGNOSIS — N390 Urinary tract infection, site not specified: Secondary | ICD-10-CM | POA: Diagnosis not present

## 2015-02-08 DIAGNOSIS — Z23 Encounter for immunization: Secondary | ICD-10-CM

## 2015-02-08 DIAGNOSIS — M7989 Other specified soft tissue disorders: Secondary | ICD-10-CM | POA: Diagnosis not present

## 2015-02-08 DIAGNOSIS — Z87891 Personal history of nicotine dependence: Secondary | ICD-10-CM | POA: Diagnosis not present

## 2015-02-08 DIAGNOSIS — J438 Other emphysema: Secondary | ICD-10-CM

## 2015-02-08 DIAGNOSIS — E785 Hyperlipidemia, unspecified: Secondary | ICD-10-CM | POA: Diagnosis not present

## 2015-02-08 DIAGNOSIS — R531 Weakness: Secondary | ICD-10-CM | POA: Diagnosis not present

## 2015-02-08 DIAGNOSIS — I10 Essential (primary) hypertension: Secondary | ICD-10-CM | POA: Insufficient documentation

## 2015-02-08 LAB — URINALYSIS, ROUTINE W REFLEX MICROSCOPIC
Glucose, UA: NEGATIVE mg/dL
Hgb urine dipstick: NEGATIVE
KETONES UR: 15 mg/dL — AB
NITRITE: POSITIVE — AB
Protein, ur: 30 mg/dL — AB
Specific Gravity, Urine: 1.023 (ref 1.005–1.030)
Urobilinogen, UA: 1 mg/dL (ref 0.0–1.0)
pH: 5.5 (ref 5.0–8.0)

## 2015-02-08 LAB — URINE MICROSCOPIC-ADD ON

## 2015-02-08 LAB — I-STAT TROPONIN, ED: TROPONIN I, POC: 0 ng/mL (ref 0.00–0.08)

## 2015-02-08 LAB — CBC
HCT: 49.3 % (ref 39.0–52.0)
HEMOGLOBIN: 16.1 g/dL (ref 13.0–17.0)
MCH: 34 pg (ref 26.0–34.0)
MCHC: 32.7 g/dL (ref 30.0–36.0)
MCV: 104.2 fL — ABNORMAL HIGH (ref 78.0–100.0)
Platelets: 155 10*3/uL (ref 150–400)
RBC: 4.73 MIL/uL (ref 4.22–5.81)
RDW: 13.3 % (ref 11.5–15.5)
WBC: 5.9 10*3/uL (ref 4.0–10.5)

## 2015-02-08 LAB — BASIC METABOLIC PANEL
Anion gap: 8 (ref 5–15)
BUN: 11 mg/dL (ref 6–20)
CALCIUM: 9.2 mg/dL (ref 8.9–10.3)
CO2: 27 mmol/L (ref 22–32)
CREATININE: 1.07 mg/dL (ref 0.61–1.24)
Chloride: 104 mmol/L (ref 101–111)
GFR calc Af Amer: >60 mL/min (ref >60)
GFR calc non Af Amer: >60 mL/min (ref >60)
Glucose, Bld: 88 mg/dL (ref 65–99)
Potassium: 4.1 mmol/L (ref 3.5–5.1)
SODIUM: 139 mmol/L (ref 135–145)

## 2015-02-08 LAB — POCT CBC
Granulocyte percent: 65 %G (ref 37–80)
HCT, POC: 47.3 % (ref 43.5–53.7)
Hemoglobin: 15.9 g/dL (ref 14.1–18.1)
LYMPH, POC: 1.6 (ref 0.6–3.4)
MCH, POC: 34 pg — AB (ref 27–31.2)
MCHC: 33.7 g/dL (ref 31.8–35.4)
MCV: 101 fL — AB (ref 80–97)
MID (cbc): 0.3 (ref 0–0.9)
MPV: 6.6 fL (ref 0–99.8)
PLATELET COUNT, POC: 161 10*3/uL (ref 142–424)
POC Granulocyte: 3.5 (ref 2–6.9)
POC LYMPH PERCENT: 29.2 %L (ref 10–50)
POC MID %: 5.8 %M (ref 0–12)
RBC: 4.68 M/uL — AB (ref 4.69–6.13)
RDW, POC: 14 %
WBC: 5.4 10*3/uL (ref 4.6–10.2)

## 2015-02-08 LAB — GLUCOSE, POCT (MANUAL RESULT ENTRY): POC GLUCOSE: 80 mg/dL (ref 70–99)

## 2015-02-08 MED ORDER — SODIUM CHLORIDE 0.9 % IV BOLUS (SEPSIS)
1000.0000 mL | Freq: Once | INTRAVENOUS | Status: AC
  Administered 2015-02-08: 1000 mL via INTRAVENOUS

## 2015-02-08 MED ORDER — CIPROFLOXACIN HCL 500 MG PO TABS: 500.0000 mg | ORAL_TABLET | Freq: Two times a day (BID) | ORAL | Status: DC

## 2015-02-08 MED ORDER — CIPROFLOXACIN HCL 500 MG PO TABS
500.0000 mg | ORAL_TABLET | Freq: Once | ORAL | Status: AC
  Administered 2015-02-08: 500 mg via ORAL
  Filled 2015-02-08: qty 1

## 2015-02-08 NOTE — ED Notes (Addendum)
Pt here for weakness and low BP. sts sent here by his doctor. sts also some dizziness upon standing and seeing spots on his eyes. sts started 2 months ago.

## 2015-02-08 NOTE — Patient Instructions (Signed)
PRESENT IMMEDIATELY TO [facility] EMERGENCY DEPARTMENT DUE TO YOUR LOW BLOOD PRESSURE AND DIZZINESS.

## 2015-02-08 NOTE — Discharge Instructions (Signed)
Dizziness °Dizziness is a common problem. It is a feeling of unsteadiness or light-headedness. You may feel like you are about to faint. Dizziness can lead to injury if you stumble or fall. Anyone can become dizzy, but dizziness is more common in older adults. This condition can be caused by a number of things, including medicines, dehydration, or illness. °HOME CARE INSTRUCTIONS °Taking these steps may help with your condition: °Eating and Drinking °· Drink enough fluid to keep your urine clear or pale yellow. This helps to keep you from becoming dehydrated. Try to drink more clear fluids, such as water. °· Do not drink alcohol. °· Limit your caffeine intake if directed by your health care provider. °· Limit your salt intake if directed by your health care provider. °Activity °· Avoid making quick movements. °· Rise slowly from chairs and steady yourself until you feel okay. °· In the morning, first sit up on the side of the bed. When you feel okay, stand slowly while you hold onto something until you know that your balance is fine. °· Move your legs often if you need to stand in one place for a long time. Tighten and relax your muscles in your legs while you are standing. °· Do not drive or operate heavy machinery if you feel dizzy. °· Avoid bending down if you feel dizzy. Place items in your home so that they are easy for you to reach without leaning over. °Lifestyle °· Do not use any tobacco products, including cigarettes, chewing tobacco, or electronic cigarettes. If you need help quitting, ask your health care provider. °· Try to reduce your stress level, such as with yoga or meditation. Talk with your health care provider if you need help. °General Instructions °· Watch your dizziness for any changes. °· Take medicines only as directed by your health care provider. Talk with your health care provider if you think that your dizziness is caused by a medicine that you are taking. °· Tell a friend or a family  member that you are feeling dizzy. If he or she notices any changes in your behavior, have this person call your health care provider. °· Keep all follow-up visits as directed by your health care provider. This is important. °SEEK MEDICAL CARE IF: °· Your dizziness does not go away. °· Your dizziness or light-headedness gets worse. °· You feel nauseous. °· You have reduced hearing. °· You have new symptoms. °· You are unsteady on your feet or you feel like the room is spinning. °SEEK IMMEDIATE MEDICAL CARE IF: °· You vomit or have diarrhea and are unable to eat or drink anything. °· You have problems talking, walking, swallowing, or using your arms, hands, or legs. °· You feel generally weak. °· You are not thinking clearly or you have trouble forming sentences. It may take a friend or family member to notice this. °· You have chest pain, abdominal pain, shortness of breath, or sweating. °· Your vision changes. °· You notice any bleeding. °· You have a headache. °· You have neck pain or a stiff neck. °· You have a fever. °  °This information is not intended to replace advice given to you by your health care provider. Make sure you discuss any questions you have with your health care provider. °  °Document Released: 10/07/2000 Document Revised: 08/28/2014 Document Reviewed: 04/09/2014 °Elsevier Interactive Patient Education ©2016 [employer] ° °Urinary Tract Infection °Urinary tract infections (UTIs) can develop anywhere along your urinary tract. Your urinary tract is   your body's drainage system for removing wastes and extra water. Your urinary tract includes two kidneys, two ureters, a bladder, and a urethra. Your kidneys are a pair of bean-shaped organs. Each kidney is about the size of your fist. They are located below your ribs, one on each side of your spine. °CAUSES °Infections are caused by microbes, which are microscopic organisms, including fungi, viruses, and bacteria. These organisms are so small that  they can only be seen through a microscope. Bacteria are the microbes that most commonly cause UTIs. °SYMPTOMS  °Symptoms of UTIs may vary by age and gender of the patient and by the location of the infection. Symptoms in young women typically include a frequent and intense urge to urinate and a painful, burning feeling in the bladder or urethra during urination. Older women and men are more likely to be tired, shaky, and weak and have muscle aches and abdominal pain. A fever may mean the infection is in your kidneys. Other symptoms of a kidney infection include pain in your back or sides below the ribs, nausea, and vomiting. °DIAGNOSIS °To diagnose a UTI, your caregiver will ask you about your symptoms. Your caregiver will also ask you to provide a urine sample. The urine sample will be tested for bacteria and white blood cells. White blood cells are made by your body to help fight infection. °TREATMENT  °Typically, UTIs can be treated with medication. Because most UTIs are caused by a bacterial infection, they usually can be treated with the use of antibiotics. The choice of antibiotic and length of treatment depend on your symptoms and the type of bacteria causing your infection. °HOME CARE INSTRUCTIONS °· If you were prescribed antibiotics, take them exactly as your caregiver instructs you. Finish the medication even if you feel better after you have only taken some of the medication. °· Drink enough water and fluids to keep your urine clear or pale yellow. °· Avoid caffeine, tea, and carbonated beverages. They tend to irritate your bladder. °· Empty your bladder often. Avoid holding urine for long periods of time. °· Empty your bladder before and after sexual intercourse. °· After a bowel movement, women should cleanse from front to back. Use each tissue only once. °SEEK MEDICAL CARE IF:  °· You have back pain. °· You develop a fever. °· Your symptoms do not begin to resolve within 3 days. °SEEK IMMEDIATE  MEDICAL CARE IF:  °· You have severe back pain or lower abdominal pain. °· You develop chills. °· You have nausea or vomiting. °· You have continued burning or discomfort with urination. °MAKE SURE YOU:  °· Understand these instructions. °· Will watch your condition. °· Will get help right away if you are not doing well or get worse. °  °This information is not intended to replace advice given to you by your health care provider. Make sure you discuss any questions you have with your health care provider. °  °Document Released: 01/21/2005 Document Revised: 01/02/2015 Document Reviewed: 05/22/2011 °Elsevier Interactive Patient Education ©2016 Elsevier Inc. ° °

## 2015-02-08 NOTE — ED Notes (Signed)
Patient undressed, in gown, on monitor, continuous pulse oximetry and blood pressure cuff 

## 2015-02-08 NOTE — ED Provider Notes (Signed)
CSN: [id number]     Arrival date & time 02/08/15  1400 History   First MD Initiated Contact with Patient 02/08/15 1627     Chief Complaint  Patient presents with  . Hypotension  . Weakness   Chris Payne is a 64 y.o. male with a past medical history significant for AAA status post repair, stroke, hypertension, coronary artery disease with MI status post PCI 3, COPD, chronic back pain, and recent pyelonephritis who presents from his PCPs office with hypotension, lightheadedness, urinary symptoms, as well as chronic lower extremity edema and back pain. The patient reports that he has had symptoms for approximately 2 months of lower extremity swelling and intermittent lightheadedness. The patient says that his symptoms do seem to be worsened when he exerts himself and ambulating. The patient says he is not having any abdominal pain but does report having chronic back pain. The patient says this has not worsened recently. The patient says that he had pyelonephritis one month ago and feels that it has not "cleared up". The patient describes darker urine. The patient says that he is having lightheadedness but denies room spinning sensation. The patient denies any fevers, chills, chest pain, shortness of breath, nausea, vomiting, constipation, diarrhea. Patient does report a dry cough for several months. The patient denies starting any new medications recently or stopping any medications. The patient thinks that he eats and drinks enough. The patient has no other complaints on arrival and is resting comfortably in his exam room bed.   (Consider location/radiation/quality/duration/timing/severity/associated sxs/prior Treatment) Patient is a 64 y.o. male presenting with dizziness. The history is provided by the patient. No language interpreter was used.  Dizziness Quality:  Lightheadedness Severity:  Moderate Onset quality:  Gradual Duration:  2 months Timing:  Intermittent Progression:  Waxing and  waning Chronicity:  New Context: physical activity and standing up   Context: not when bending over, not with head movement, not with loss of consciousness and not when urinating   Relieved by:  Nothing Worsened by:  Nothing Ineffective treatments:  None tried Associated symptoms: no blood in stool, no chest pain, no diarrhea, no headaches, no hearing loss, no nausea, no palpitations, no shortness of breath, no syncope, no vomiting and no weakness   Risk factors: no anemia and no new medications     Past Medical History  Diagnosis Date  . Hypertension   . Stroke [facility])     h/o pontine CVA  . AAA (abdominal aortic aneurysm) ([place])     s/p repair 6/11  . Hyperlipidemia   . CAD (coronary artery disease)     s/p CABG 2/12:  LIMA to LAD, SVG to diagonal-1, SVG to ramus intermedius,, SVG to AM (Dr. Roxan Hockey) ;  b.  Myoview 10/13:  inf infarct with very mild peri-infarct ischemia, EF 49%, inf HK; c  He has severe three-vessel native coronary artery disease. Catheterization March 2015 as above  . Carotid stenosis     a.  dopplers 4/09: LICA 81-19%;  b. Carotid dopplers 3/14:  R 0-39%, L 60-79%, repeat 6 mos  . PAD (peripheral artery disease) ([place])     ABIs 4/12:  R 0.88, L 0.92; R SFA 40%, L CFA 50%, L SFA 50-60%  . COPD (chronic obstructive pulmonary disease) ([place])   . Inguinal hernia   . Thyroid nodule     incidental finding on carotid doppler 3/14 => thyroid U/S ordered  . Pneumonia     hx  . GERD (gastroesophageal reflux  disease)   . Myocardial infarction [facility])     PCI x3  . [id number](182.9)    Past Surgical History  Procedure Laterality Date  . Hip surgery  40 years ago    left hip bone removal and pinning  . Abdominal aortic aneurysm repair  10-03-2009  . Exploratory laparotomy  09/2009    ligation of lumbar arteries  . Coronary artery bypass graft  06/24/2010    LIMA to the LAD, SVG to first diagonal, SVG to ramus intermediate, SVG to acute marginal. EF 50%. 2/12  . External  ear surgery Left     laceration child  . Inguinal hernia repair Left 07/21/2012    Procedure: HERNIA REPAIR INGUINAL ADULT;  Surgeon: Merrie Roof, MD;  Location: [facility];  Service: General;  Laterality: Left;  . Insertion of mesh Left 07/21/2012    Procedure: INSERTION OF MESH;  Surgeon: Merrie Roof, MD;  Location: [facility];  Service: General;  Laterality: Left;  . Exploration post operative open heart    . Nm myocar perf wall motion  12/02/2010    inferoapical defect is fixed c/w prior infarction/scar, there is minimal borderzone ischemia.  . Left heart catheterization with coronary/graft angiogram  06/21/2012    Procedure: LEFT HEART CATHETERIZATION WITH Beatrix Fetters;  Surgeon: Minus Breeding, MD;  Location: [facility] CATH LAB;  Service: Cardiovascular;;  . Left heart catheterization with coronary/graft angiogram N/A 07/04/2013    Procedure: LEFT HEART CATHETERIZATION WITH Beatrix Fetters;  Surgeon: Sinclair Grooms, MD;  Location: [facility] CATH LAB;  Service: Cardiovascular;  Laterality: N/A;   Family History  Problem Relation Age of Onset  . Alcohol abuse Father   . Cancer Sister    Social History  Substance Use Topics  . Smoking status: Former Smoker -- 1.00 packs/day for 30 years    Types: Cigarettes    Quit date: 01/25/2010  . Smokeless tobacco: Never Used  . Alcohol Use: No     Comment: former drinker - Quit 2011.    Review of Systems  Constitutional: Negative for chills and diaphoresis.  HENT: Negative for congestion and hearing loss.   Eyes: Negative for photophobia.  Respiratory: Negative for cough, choking, chest tightness, shortness of breath, wheezing and stridor.   Cardiovascular: Negative for chest pain, palpitations and syncope.  Gastrointestinal: Negative for nausea, vomiting, abdominal pain, diarrhea and blood in stool.  Genitourinary: Positive for dysuria (change in urine appearance). Negative for flank pain.  Musculoskeletal: Positive for back pain.  Negative for neck pain and neck stiffness.  Skin: Negative for rash.  Neurological: Positive for light-headedness. Negative for dizziness, tremors, seizures, facial asymmetry, weakness, numbness and headaches.  Psychiatric/Behavioral: Negative for confusion and agitation.  All other systems reviewed and are negative.     Allergies  Review of patient's allergies indicates no known allergies.  Home Medications   Prior to Admission medications   Medication Sig Start Date End Date Taking? Authorizing Provider  aspirin EC 81 MG tablet Take 81 mg by mouth every morning.     Historical Provider, MD  B Complex-Folic Acid (B COMPLEX-VITAMIN B12) TABS Take 1 tablet by mouth daily. 11/26/13   Barton Fanny, MD  carvedilol (COREG) 3.125 MG tablet Take 1 tablet (3.125 mg total) by mouth daily with breakfast. 10/26/13   Minus Breeding, MD  clopidogrel (PLAVIX) 75 MG tablet Take 1 tablet (75 mg total) by mouth daily with breakfast. 09/23/13   Bernadene Bell, MD  furosemide (LASIX) 20  MG tablet Take 1 tablet (20 mg total) by mouth daily. Patient not taking: Reported on 02/08/2015 09/20/14   Elmyra Ricks Pisciotta, PA-C  gabapentin (NEURONTIN) 100 MG capsule TAKE 1 CAPSULE BY MOUTH EACH MORNING AND2 CAPSULES AT BEDTIME AS DIRECTED Patient not taking: Reported on 09/19/2014 02/21/14   Barton Fanny, MD  HYDROcodone-acetaminophen [facility]) 10-325 MG per tablet Take 2 tablets by mouth 4 (four) times daily.     Historical Provider, MD  isosorbide mononitrate (IMDUR) 120 MG 24 hr tablet Take 1 tablet (120 mg total) by mouth daily. 07/27/14   Minus Breeding, MD  levETIRAcetam (KEPPRA) 500 MG tablet Take 500 mg by mouth 2 (two) times daily.    Historical Provider, MD  metoprolol succinate (TOPROL-XL) 25 MG 24 hr tablet Take 12.5 mg by mouth 2 (two) times daily.    Historical Provider, MD  metoprolol tartrate (LOPRESSOR) 25 MG tablet Take 0.5 tablets (12.5 mg total) by mouth 2 (two) times daily. 08/10/14   Minus Breeding, MD  morphine (MS CONTIN) 30 MG 12 hr tablet Take 30 mg by mouth every 12 (twelve) hours.  07/07/13   Historical Provider, MD  nitroGLYCERIN (NITROSTAT) 0.4 MG SL tablet Place 0.4 mg under the tongue every 5 (five) minutes as needed for chest pain.  06/01/13   Minus Breeding, MD  ranolazine (RANEXA) 1000 MG SR tablet Take 1 tablet (1,000 mg total) by mouth 2 (two) times daily. 08/06/14   Minus Breeding, MD  rosuvastatin (CRESTOR) 40 MG tablet Take 1 tablet (40 mg total) by mouth daily. 06/18/14   Minus Breeding, MD  tamsulosin (FLOMAX) 0.4 MG CAPS capsule Take 0.4 mg by mouth at bedtime.     Historical Provider, MD   BP 104/73 mmHg  Pulse 72  Temp(Src) 98.5 F (36.9 C) (Oral)  Resp 18  SpO2 96% Physical Exam  Constitutional: He is oriented to person, place, and time. He appears well-developed and well-nourished. No distress.  HENT:  Head: Normocephalic and atraumatic.  Mouth/Throat: No oropharyngeal exudate.  Eyes: Conjunctivae and EOM are normal. Pupils are equal, round, and reactive to light.  Neck: Normal range of motion.  Cardiovascular: Normal rate, regular rhythm, normal heart sounds and intact distal pulses.   No murmur heard. Pulmonary/Chest: Effort normal and breath sounds normal. No stridor. No respiratory distress. He has no wheezes. He exhibits no tenderness.  Abdominal: Soft. He exhibits no distension. There is no tenderness. There is no rebound.  Musculoskeletal: He exhibits no tenderness.  Neurological: He is alert and oriented to person, place, and time. No cranial nerve deficit. He exhibits normal muscle tone. Coordination normal.  Skin: Skin is warm. He is not diaphoretic. No erythema.  Psychiatric: He has a normal mood and affect.  Nursing note and vitals reviewed.   ED Course  Procedures (including critical care time) Labs Review Labs Reviewed  CBC - Abnormal; Notable for the following:    MCV 104.2 (*)    All other components within normal limits   URINALYSIS, ROUTINE W REFLEX MICROSCOPIC (NOT AT [facility]) - Abnormal; Notable for the following:    Color, Urine AMBER (*)    APPearance CLOUDY (*)    Bilirubin Urine SMALL (*)    Ketones, ur 15 (*)    Protein, ur 30 (*)    Nitrite POSITIVE (*)    Leukocytes, UA LARGE (*)    All other components within normal limits  URINE MICROSCOPIC-ADD ON - Abnormal; Notable for the following:    Bacteria, UA MANY (*)  Crystals CA OXALATE CRYSTALS (*)    All other components within normal limits  URINE CULTURE  BASIC METABOLIC PANEL  Randolm Idol, ED    Imaging Review Dg Chest 2 View  02/08/2015  CLINICAL DATA:  Hypotension.  Dizziness.  Weakness. EXAM: CHEST - 2 VIEW COMPARISON:  CTA chest 10/12/2014. FINDINGS: The heart size is normal. Emphysematous changes are again noted. Left basilar airspace disease likely reflects atelectasis or scarring. The upper lung fields are clear. The visualized soft tissues and bony thorax are unremarkable. IMPRESSION: 1. No acute cardiopulmonary disease. 2. Emphysema. Electronically Signed   By: San Morelle M.D.   On: 02/08/2015 15:28   I have personally reviewed and evaluated these images and lab results as part of my medical decision-making.   EKG Interpretation   Date/Time:  Friday February 08 2015 16:38:06 EDT Ventricular Rate:  66 PR Interval:  185 QRS Duration: 107 QT Interval:  440 QTC Calculation: 461 R Axis:   -37 Text Interpretation:  Sinus rhythm Probable left atrial enlargement Left  axis deviation Abnormal R-wave progression, late transition T wave  inversion in aVL new from previous but no other changes from previous  Confirmed by LITTLE MD, RACHEL [phone]) on 02/08/2015 5:14:19 PM      MDM   [name] is a 64 y.o. male with a past medical history significant for AAA status post repair, stroke, hypertension, coronary artery disease with MI status post PCI 3, COPD, chronic back pain, and recent pyelonephritis who presents  from his PCPs office with hypotension, lightheadedness, urinary symptoms, as well as chronic lower extremity edema and back pain. Given the patient's recent pyelonephritis and his continued urinary symptoms, suspect an occult infection as the source of his lightheadedness and possible dehydration. As the patient has a history of AAA status post repair in the setting of hypotension, anticipate obtaining CT imaging of his pathology to look for dilation or dissection. The patient is denying any chest pain or shortness of breath or abdominal pain but does say he is having chronic back pain. The patient's exam was unremarkable for any abnormalities including neurologic testing.  The patient will be given fluids while his laboratory workup is initiated. The patient will also have an x-ray of his chest to look for abnormality given his cough and lightheadedness. The patient's blood pressure was 027 systolic on arrival.  The patient's laboratory testing revealed evidence of urinary tract infection as the patient's suspected. The patient's troponin was negative, his BMP did not show any evidence of kidney abnormality, and his CBC was unremarkable. The patient's chest x-ray showed emphysema but otherwise no acute cardio pulmonary disease.  The patient was given fluids and his blood pressures were monitored. The patient did not have any episodes of hypotension while here in the emergency department. The patient was given a dose of antiemetics and it is felt that his lightheadedness and previous hypotension is secondary to his infection. The patient was given instructions on staying hydrated as well as follow-up with his PCP. The patient was given return precautions for any new or worsening symptoms suggestive of pyelonephritis or other worsening infection.  The patient did not have any abdominal pain or any new back pain which might suggest an intra-abdominal or aortic etiology of his hypertension. The patient was also  given return precautions for any new symptoms of abdominal pain or presyncope. The patient had no other questions, concerns, or complaints while in the emergency department and the patient was discharged in  good condition with resolution of his hypotension and a plan to treat his infection.  This patient was seen with Dr. Rex Kras, emergency medicine attending.   Final diagnoses:  UTI (lower urinary tract infection)  Lightheadedness       Antony Blackbird, MD 02/09/15 [place], MD 02/09/15 [phone]

## 2015-02-08 NOTE — Progress Notes (Signed)
Subjective:    Patient ID: CAROLYN MANISCALCO, male    DOB: 11/21/1950, 64 y.o.   MRN: [id number]  02/08/2015  Dizziness; Knee Pain; and Foot Swelling   HPI This 64 y.o. male presents for evaluation of dizziness resulting in a fall and now also suffering with knee pain and B feet swelling.   L knee pain: leg went out; hit L knee; hurting horribly.  Not sure if dizzy with recent fall.  [place] coming out of bathroom.  No syncope with fall.    Dizziness: onset one month.  With getting up out of recliner; walking to front porch, will start. With movement and walking, improves.  No falls due to dizziness.  Weak in legs for one year.  Legs swelling for two years.  No syncope.  No chest pain.  Taking NTG once per month on average; no recent increase in usage.  Soreness under L axilla; duration two months; persistent daily and constant.  No exertional component to L axilla pain.   Bruises easily.  +SOB; aggravating phlegm in throat; takes mucous pills with improvement; onset 2 months.  No worsening swelling.  Keeps feet elevated without improvement.  No n/v; +diarrhea for three days; one episode of diarrhea daily for three days; no bloody stools.  Eating and drinking well.  Drinking too well.  Drinking a lot during the day.  Weight down 14 pounds from 05/2014.   DDD lumbar: scheduled for injection on 02/14/15. [facility] [place]; gets pain medication in [place].    Urinating well; had urinary retention 4 months ago; foley catheter placed.  Evaluated by Urology. No dysuria; no frequency.    Review of Systems  Constitutional: Negative for fever, chills, diaphoresis, activity change, appetite change and fatigue.  Respiratory: Negative for cough and shortness of breath.   Cardiovascular: Positive for leg swelling. Negative for chest pain and palpitations.  Gastrointestinal: Positive for diarrhea. Negative for nausea, vomiting and abdominal pain.  Endocrine: Negative for cold intolerance, heat  intolerance, polydipsia, polyphagia and polyuria.  Genitourinary: Negative for dysuria, urgency, frequency, hematuria, flank pain, difficulty urinating and testicular pain.  Musculoskeletal: Positive for back pain, joint swelling, arthralgias and gait problem.  Skin: Negative for color change, rash and wound.  Neurological: Positive for dizziness. Negative for tremors, seizures, syncope, facial asymmetry, speech difficulty, weakness, light-headedness, numbness and headaches.  Psychiatric/Behavioral: Negative for sleep disturbance and dysphoric mood. The patient is not nervous/anxious.     Past Medical History  Diagnosis Date  . Hypertension   . Stroke [facility])     h/o pontine CVA  . AAA (abdominal aortic aneurysm) ([place])     s/p repair 6/11  . Hyperlipidemia   . CAD (coronary artery disease)     s/p CABG 2/12:  LIMA to LAD, SVG to diagonal-1, SVG to ramus intermedius,, SVG to AM (Dr. Roxan Hockey) ;  b.  Myoview 10/13:  inf infarct with very mild peri-infarct ischemia, EF 49%, inf HK; c  He has severe three-vessel native coronary artery disease. Catheterization March 2015 as above  . Carotid stenosis     a.  dopplers 6/28: LICA 31-51%;  b. Carotid dopplers 3/14:  R 0-39%, L 60-79%, repeat 6 mos  . PAD (peripheral artery disease) ([place])     ABIs 4/12:  R 0.88, L 0.92; R SFA 40%, L CFA 50%, L SFA 50-60%  . COPD (chronic obstructive pulmonary disease) ([place])   . Inguinal hernia   . Thyroid nodule  incidental finding on carotid doppler 3/14 => thyroid U/S ordered  . Pneumonia     hx  . GERD (gastroesophageal reflux disease)   . Myocardial infarction [facility])     PCI x3  . HQIONGEX(528.4)    Past Surgical History  Procedure Laterality Date  . Hip surgery  40 years ago    left hip bone removal and pinning  . Abdominal aortic aneurysm repair  10-03-2009  . Exploratory laparotomy  09/2009    ligation of lumbar arteries  . Coronary artery bypass graft  06/24/2010    LIMA to the LAD, SVG to first  diagonal, SVG to ramus intermediate, SVG to acute marginal. EF 50%. 2/12  . External ear surgery Left     laceration child  . Inguinal hernia repair Left 07/21/2012    Procedure: HERNIA REPAIR INGUINAL ADULT;  Surgeon: Merrie Roof, MD;  Location: [facility];  Service: General;  Laterality: Left;  . Insertion of mesh Left 07/21/2012    Procedure: INSERTION OF MESH;  Surgeon: Merrie Roof, MD;  Location: [facility];  Service: General;  Laterality: Left;  . Exploration post operative open heart    . Nm myocar perf wall motion  12/02/2010    inferoapical defect is fixed c/w prior infarction/scar, there is minimal borderzone ischemia.  . Left heart catheterization with coronary/graft angiogram  06/21/2012    Procedure: LEFT HEART CATHETERIZATION WITH Beatrix Fetters;  Surgeon: Minus Breeding, MD;  Location: [facility] C[facility] LAB;  Service: Cardiovascular;;  . Left heart catheterization with coronary/graft angiogram N/A 07/04/2013    Procedure: LEFT HEART CATHETERIZATION WITH Beatrix Fetters;  Surgeon: Sinclair Grooms, MD;  Location: [facility] CATH LAB;  Service: Cardiovascular;  Laterality: N/A;   No Known Allergies  Social History   Social History  . Marital Status: Married    Spouse Name: Rod Holler  . Number of Children: 0  . Years of Education: 10   Occupational History  .      Retired   Social History Main Topics  . Smoking status: Former Smoker -- 1.00 packs/day for 30 years    Types: Cigarettes    Quit date: 01/25/2010  . Smokeless tobacco: Never Used  . Alcohol Use: No     Comment: former drinker - Quit 2011.  . Drug Use: No  . Sexual Activity: No   Other Topics Concern  . Not on file   Social History Narrative   Retired Journalist, newspaper.  Lives with wife Rod Holler) and stepson. Married x 13 years.   Education 10th grade.   Right handed.   Caffeine five cups of coffee daily.   Family History  Problem Relation Age of Onset  . Alcohol abuse Father   . Cancer Sister          Objective:    BP 80/40 mmHg  Pulse 89  Temp(Src) 98 F (36.7 C) (Oral)  Resp 16  Ht 5' 5.5" (1.664 m)  Wt 148 lb 3.2 oz (67.223 kg)  BMI 24.28 kg/m2 Physical Exam  Constitutional: He is oriented to person, place, and time. He appears well-developed and well-nourished. He appears ill. No distress.  HENT:  Head: Normocephalic and atraumatic.  Right Ear: External ear normal.  Left Ear: External ear normal.  Nose: Nose normal.  Mouth/Throat: Oropharynx is clear and moist.  Eyes: Conjunctivae and EOM are normal. Pupils are equal, round, and reactive to light.  Neck: Normal range of motion. Neck supple. Carotid bruit is not present. No  thyromegaly present.  Cardiovascular: Normal rate, regular rhythm, normal heart sounds and intact distal pulses.  Exam reveals no gallop and no friction rub.   No murmur heard. 3+ pitting edema BLE up to proximal knees B.  Most swelling in B feet.  Pulmonary/Chest: Effort normal and breath sounds normal. He has no wheezes. He has no rales.  Abdominal: Soft. Bowel sounds are normal. He exhibits no distension and no mass. There is no tenderness. There is no rebound and no guarding.  Musculoskeletal: He exhibits edema.       Left knee: He exhibits decreased range of motion and swelling. He exhibits no deformity, no laceration and no erythema. Tenderness found. Medial joint line tenderness noted. No patellar tendon tenderness noted.  Lymphadenopathy:    He has no cervical adenopathy.  Neurological: He is alert and oriented to person, place, and time. No cranial nerve deficit.  Skin: Skin is warm and dry. No rash noted. He is not diaphoretic.  Psychiatric: He has a normal mood and affect. His behavior is normal.  Nursing note and vitals reviewed.  Results for orders placed or performed in visit on 02/08/15  POCT CBC  Result Value Ref Range   WBC 5.4 4.6 - 10.2 K/uL   Lymph, poc 1.6 0.6 - 3.4   POC LYMPH PERCENT 29.2 10 - 50 %L   MID (cbc) 0.3 0 - 0.9    POC MID % 5.8 0 - 12 %M   POC Granulocyte 3.5 2 - 6.9   Granulocyte percent 65.0 37 - 80 %G   RBC 4.68 (A) 4.69 - 6.13 M/uL   Hemoglobin 15.9 14.1 - 18.1 g/dL   HCT, POC 47.3 43.5 - 53.7 %   MCV 101.0 (A) 80 - 97 fL   MCH, POC 34.0 (A) 27 - 31.2 pg   MCHC 33.7 31.8 - 35.4 g/dL   RDW, POC 14.0 %   Platelet Count, POC 161 142 - 424 K/uL   MPV 6.6 0 - 99.8 fL  POCT glucose (manual entry)  Result Value Ref Range   POC Glucose 80 70 - 99 mg/dl       Assessment & Plan:   1. Hypotension, unspecified hypotension type   2. Coronary artery disease involving native coronary artery of native heart without angina pectoris   3. Orthostatic hypotension   4. Dizziness   5. Left knee pain   6. Need for prophylactic vaccination and inoculation against influenza   7. Leg swelling   8. History of pontine CVA   9. Other emphysema ([place])   10. S/P AAA repair     1. Hypotension:  New; in patient with known CAD, history of CVA, history of AAA repair, COPD; brother in law to transport to ED; pt refused EMS transportation.  Etiology unclear at this time.  Reports compliance with medications and with normal po intake for the past week.  Not currently taking diuretic. 2.  CAD: stable; denies active chest pain at this time. 3.  Dizziness: New. Onset in past month; likely due to hypotension.  4.  L knee pain: New.  Secondary to recent fall; warrants imaging of knee but will defer at this time due to hypotension. 5.  Leg swelling B chronic: onset two years ago; no improvement with diuretics; non-compliance with compression stockings as well.  Likely due to age, sedentary lifestyle.  Last echo 08/24/13 with EF 55-60%. 6.  History of pontine CVA: stable; no acute neurological symptoms. 7.  COPD/emphysema; stable; no  acute respiratory issues. 8.  S/p AAA repair: stable at this time. 9. S/p flu vaccine    Orders Placed This Encounter  Procedures  . Flu Vaccine QUAD 36+ mos IM  . POCT CBC  . POCT glucose  (manual entry)  . EKG 12-Lead   No orders of the defined types were placed in this encounter.    No Follow-up on file.   Tikesha Mort Elayne Guerin, M.D. [facility] [address] [place], [place]  [address] [phone] phone [phone] fax

## 2015-02-11 LAB — URINE CULTURE: Culture: >=100000

## 2015-02-12 ENCOUNTER — Telehealth (HOSPITAL_COMMUNITY): Payer: Self-pay

## 2015-02-12 DIAGNOSIS — G894 Chronic pain syndrome: Secondary | ICD-10-CM | POA: Diagnosis not present

## 2015-02-12 DIAGNOSIS — M47816 Spondylosis without myelopathy or radiculopathy, lumbar region: Secondary | ICD-10-CM | POA: Diagnosis not present

## 2015-02-12 DIAGNOSIS — M4696 Unspecified inflammatory spondylopathy, lumbar region: Secondary | ICD-10-CM | POA: Diagnosis not present

## 2015-02-12 NOTE — Telephone Encounter (Signed)
Post ED Visit - Positive Culture Follow-up: Successful Patient Follow-Up  Culture assessed and recommendations reviewed by: []  Heide Guile, Pharm.D., BCPS-AQ ID []  Alycia Rossetti, Pharm.D., BCPS []  [place], Pharm.D., BCPS, AAHIVP []  Legrand Como, Pharm.D., BCPS, AAHIVP []  [place], Pharm.D. []  Milus Glazier, Pharm.D. X Alyson Leonard Positive urine culture  []  Patient discharged without antimicrobial prescription and treatment is now indicated [x]  Organism is resistant to prescribed ED discharge antimicrobial []  Patient with positive blood cultures  Changes discussed with ED provider: Mercedis Camprubi-Soms PA-c New antibiotic prescription stop Cipro, start Amoxicillin 500mg  Q12 x 14 days Called to [place] store  Pt informed   Ileene Musa 02/12/2015, 9:32 AM

## 2015-02-12 NOTE — Progress Notes (Signed)
ED Antimicrobial Stewardship Positive Culture Follow Up   Chris Payne is an 64 y.o. male who presented to [facility] on 02/08/2015 with a chief complaint of  Chief Complaint  Patient presents with  . Hypotension  . Weakness    Recent Results (from the past 720 hour(s))  Urine culture     Status: None   Collection Time: 02/08/15  7:02 PM  Result Value Ref Range Status   Specimen Description URINE, CLEAN CATCH  Final   Special Requests NONE  Final   Culture >=100,000 COLONIES/mL ENTEROCOCCUS SPECIES  Final   Report Status 02/11/2015 FINAL  Final   Organism ID, Bacteria ENTEROCOCCUS SPECIES  Final      Susceptibility   Enterococcus species - MIC*    AMPICILLIN <=2 SENSITIVE Sensitive     LEVOFLOXACIN >=8 RESISTANT Resistant     NITROFURANTOIN <=16 SENSITIVE Sensitive     VANCOMYCIN 1 SENSITIVE Sensitive     * >=100,000 COLONIES/mL ENTEROCOCCUS SPECIES    [x]  Treated with Cipro, organism resistant to prescribed antimicrobial  New antibiotic prescription: Amoxicillin 500mg  Q 12 x 14 days  ED Provider: Erenest Blank, PA-C  Assessment: 52 yoM presenting to the ED with hypotension and weakness. Reported dysuria and change in urine appearance. UA nitrite pos, leukocyte large, bacteria many. Patient diagnosesed with UTI. Sent home on Cipro. UCx now growing enterococcus species resistant to cipro. Plan to stop cipro and change to Amoxicillin 500mg  Q 12 x 14 days  Darl Pikes, PharmD Clinical Pharmacist- Resident Pager: [phone]  Darl Pikes 02/12/2015, 8:40 AM Infectious Diseases Pharmacist Phone# [phone]

## 2015-02-15 ENCOUNTER — Other Ambulatory Visit: Payer: Self-pay

## 2015-02-15 MED ORDER — ROSUVASTATIN CALCIUM 40 MG PO TABS: 40.0000 mg | ORAL_TABLET | Freq: Every day | ORAL | Status: DC

## 2015-02-28 NOTE — Patient Outreach (Signed)
[facility]) Care Management  02/28/2015  ARMAN LOY Apr 30, 1950 [id number]   Referral from [facility] tier 4 list, assigned to Sherrin Daisy, RN for patient outreach.  Ryna Beckstrom L. Wilfred Siverson, [place] Care Management Assistant

## 2015-03-05 ENCOUNTER — Other Ambulatory Visit: Payer: Self-pay

## 2015-03-05 DIAGNOSIS — J441 Chronic obstructive pulmonary disease with (acute) exacerbation: Secondary | ICD-10-CM

## 2015-03-05 NOTE — Patient Outreach (Signed)
[place] [facility]) Care Management  03/05/2015  HENRY UTSEY 08-26-1950 [id number]   Request from Enzo Montgomery, RN to assign Community RN, assigned Raina Mina, RN.  Thanks, [name]. [place], [place] Assistant Phone: [phone] Fax: [phone]

## 2015-03-05 NOTE — Patient Outreach (Signed)
[place] [facility]) Care Management  03/05/2015  Chris Payne [birth year] [id number]   Telephone Screen  Referral Date: 02/28/15 Referral Source: [facility] Tier 4 List Referral Reason: COPD, 1 ED visit, 1 hospital admit   Outreach attempt #1 to patient. Patient reached. Screening completed.  Social: Patient resides in the home with his girlfriend. He reports that he recently moved to new address: [address] [place] [address]. He remains independent with his care. DME in the home includes cane and walker. Patient does not drive any more and relies on his sister and brother in law to take him to MD appts.   Conditions: Patient with past medical history of COPD and CHF. He reports that he does not monitor weight in the home. He complains of ongoing issues with fluid retention and LE edema. He states that he was once getting his legs wrapped by [facility] but no longer getting services. He states he is not taking diuretics due to electrolytes issues.He also reproted he ahs been having "the shakes" lately. MD aware but does not know the cause. Patient states he has ongoing pain management issues and sees MD regularly for symptom management.  Medications: Patient reports that he manages his own medications. He denies any trouble affording meds.   Appointments: Patient states he is followed by [facility]. He was last seen there on 02/08/15.   Consent: Patient gave verbal consent for [facility] services.  Plan: RN CM will notify [facility] administrative assistant that patient agreed to services and case opened. RN CM will notify [facility] of patient's change in address. RN CM will send [facility] referral for in home evaluation and assessment. RN CM provided patient with [facility] 24 hr Nurse Line contact info. RN CM confirmed patient is aware to contact MD office for any changes in condition.  Enzo Montgomery, RN,BSN,CCM [place]  Management Telephonic Care Management Coordinator Direct Phone: [phone] Toll Free: [phone] Fax: [phone]

## 2015-03-07 DIAGNOSIS — G8929 Other chronic pain: Secondary | ICD-10-CM | POA: Diagnosis not present

## 2015-03-07 DIAGNOSIS — M545 Low back pain: Secondary | ICD-10-CM | POA: Diagnosis not present

## 2015-03-07 DIAGNOSIS — Z79891 Long term (current) use of opiate analgesic: Secondary | ICD-10-CM | POA: Diagnosis not present

## 2015-03-07 DIAGNOSIS — M5416 Radiculopathy, lumbar region: Secondary | ICD-10-CM | POA: Diagnosis not present

## 2015-03-11 IMAGING — CT CT HEAD W/O CM
1 series · 15 of 30 positions shown, 19 images · non-contrast
Comparison: July 10, 2013

CLINICAL DATA: Slurred speech

EXAM:
CT HEAD WITHOUT CONTRAST
TECHNIQUE: Contiguous axial images were obtained from the base of the skull
through the vertex without intravenous contrast. Study was obtained
within 24 hr of patient's arrival at the emergency department.

[Series 2: head 5.0 h30s · axial · 0.45mm/px · z∈[-30,+120]mm · 15 of 34 slices shown, 19 images]
[im 2/34  brain]
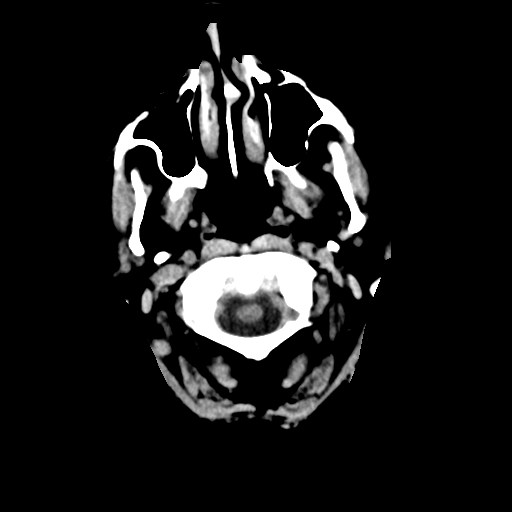
[im 2/34  bone]
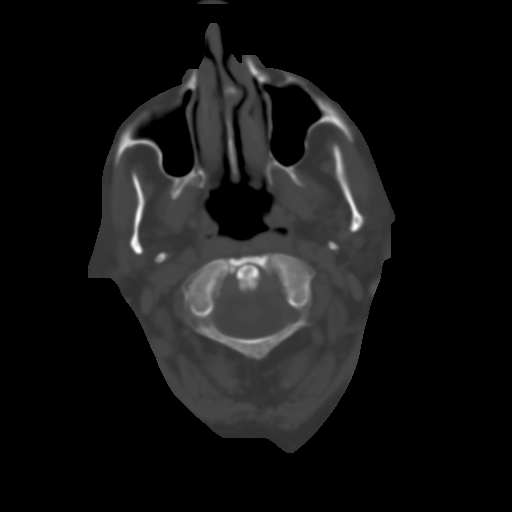
[im 4/34  brain]
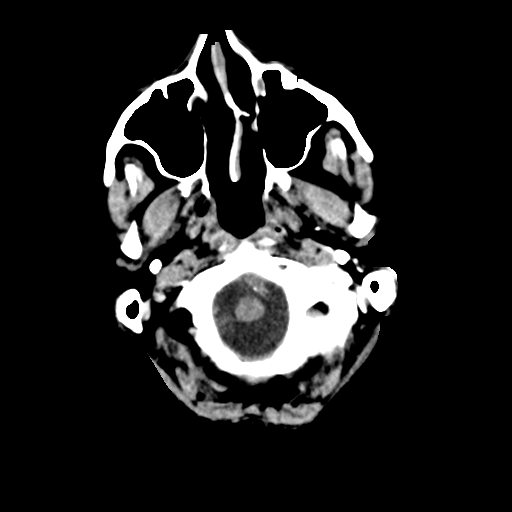
[im 6/34  brain]
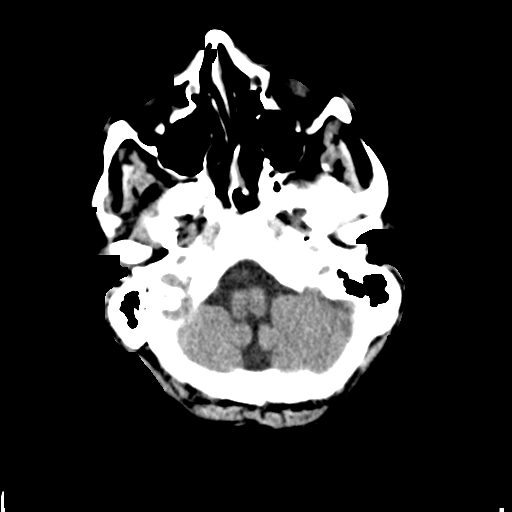
[im 8/34  brain]
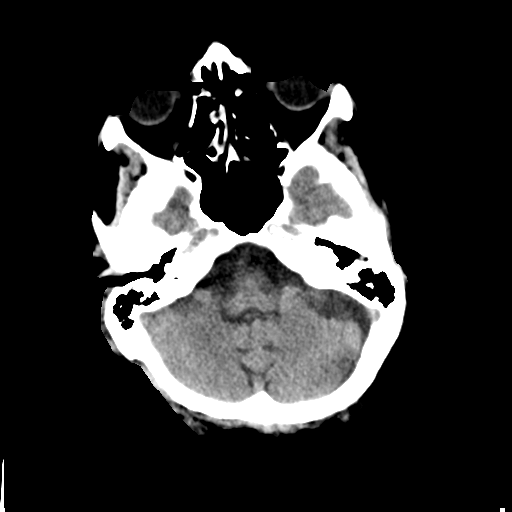
[im 11/34  brain]
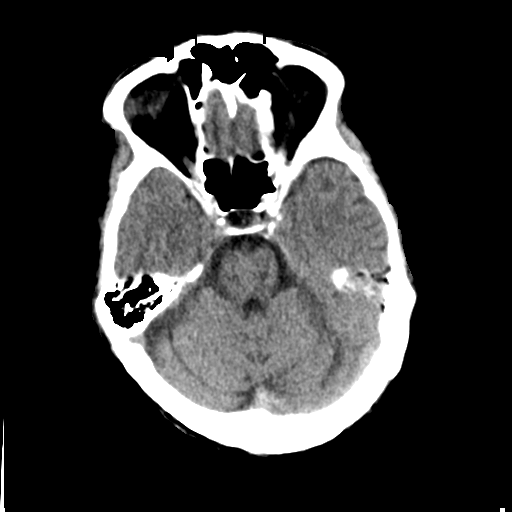
[im 11/34  bone]
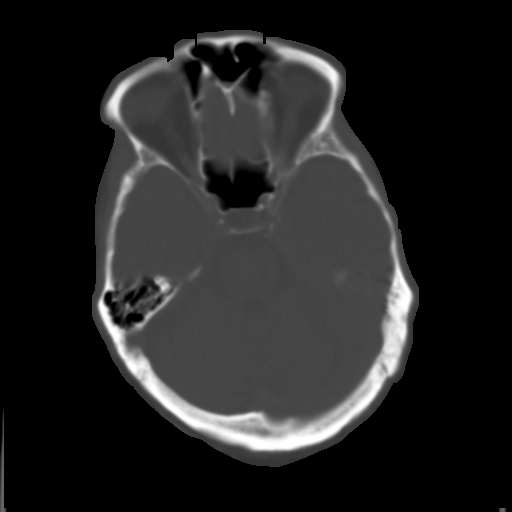
[im 13/34  brain]
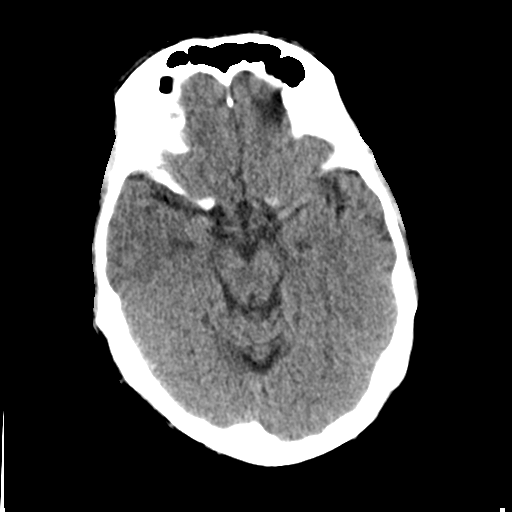
[im 15/34  brain]
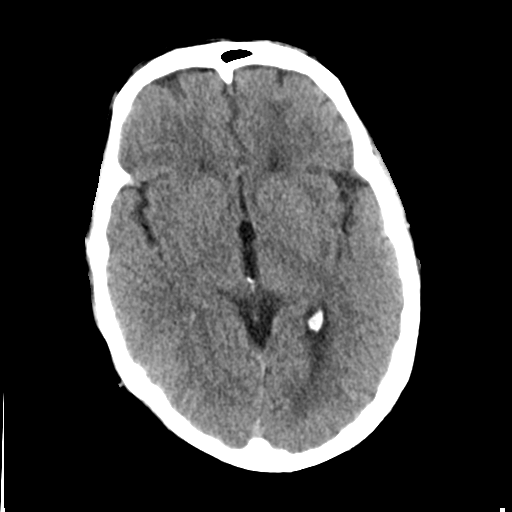
[im 18/34  brain]
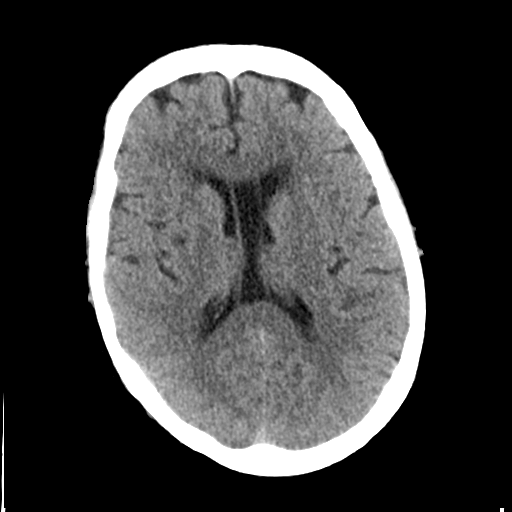
[im 19/34  brain]
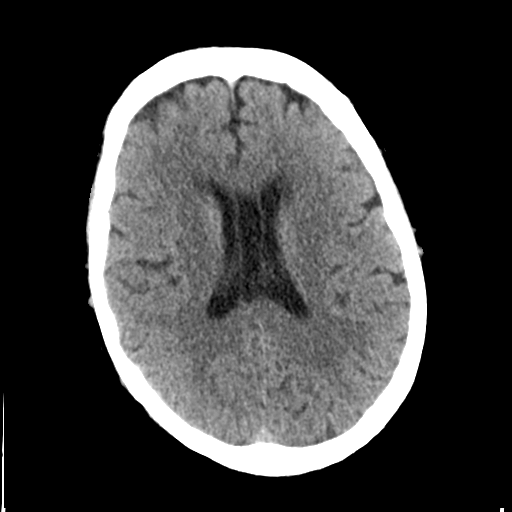
[im 19/34  bone]
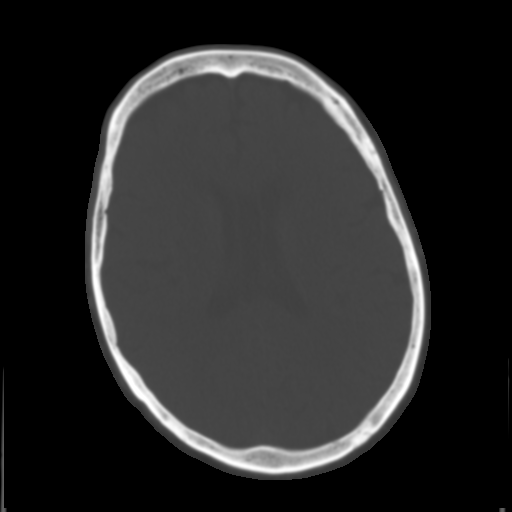
[im 21/34  brain]
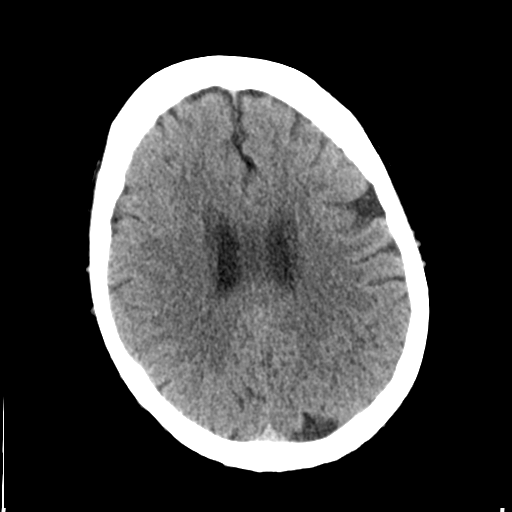
[im 23/34  brain]
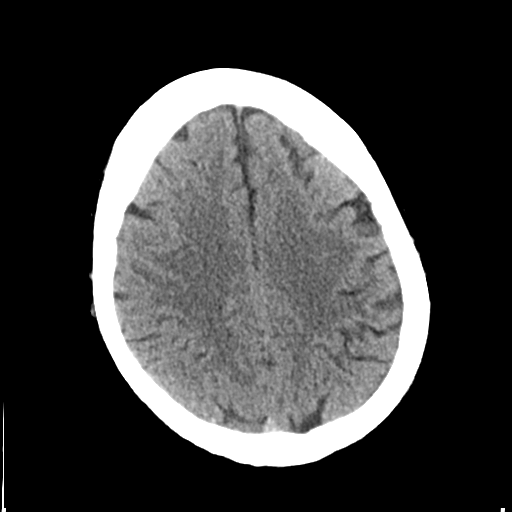
[im 26/34  brain]
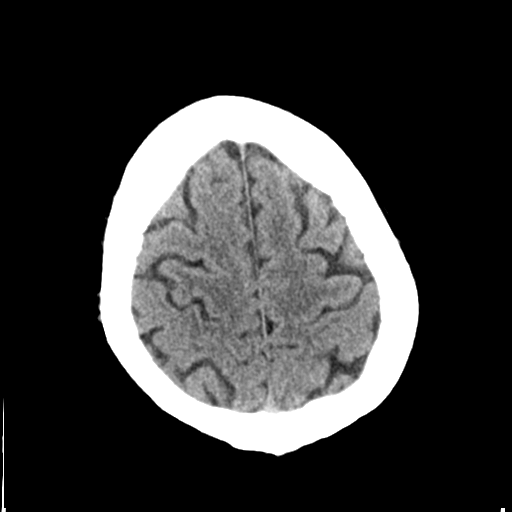
[im 28/34  brain]
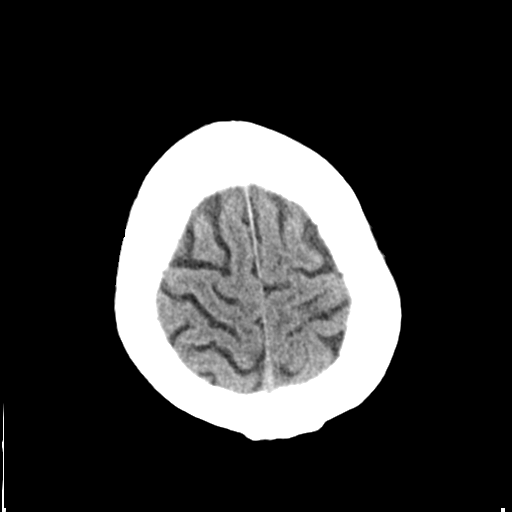
[im 28/34  bone]
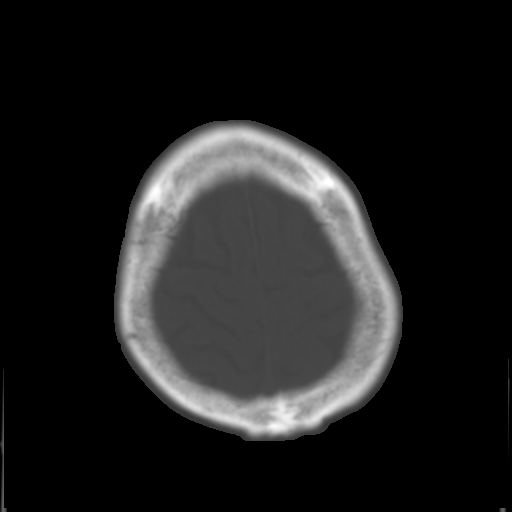
[im 30/34  brain]
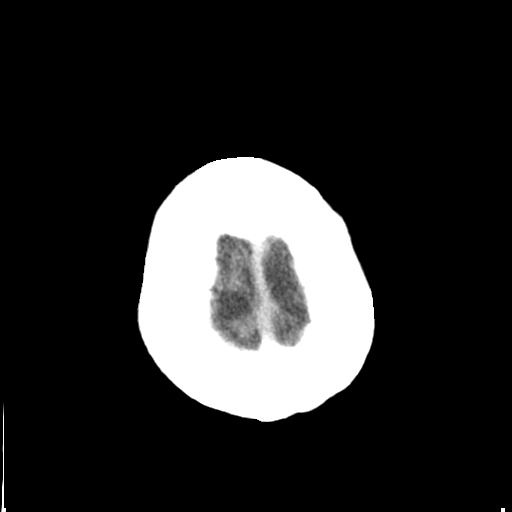
[im 32/34  brain]
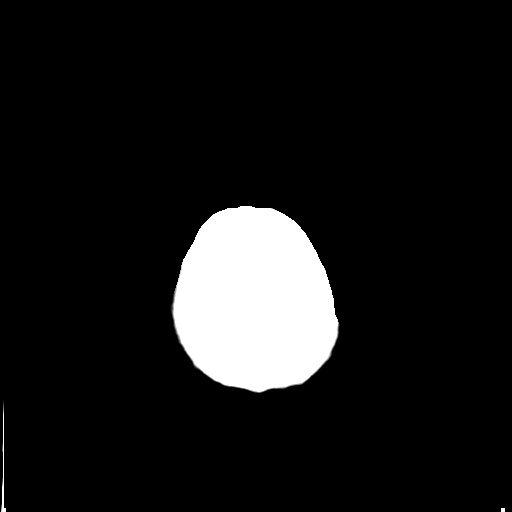

[15 of 30 positions shown; findings below may reference images not displayed]

FINDINGS: The ventricles are normal in size and configuration. There is a
cavum septum pellucidum, an anatomic variant. There is no
appreciable mass, hemorrhage, extra-axial fluid collection, or
midline shift. There are prior infarcts in each pons region, stable.
There is evidence of a prior infarct in the right putamen as well as
small vessel disease in the 's inferior centra semiovale
bilaterally, stable. There is also evidence of small vessel disease
in the region of the right internal capsule anteriorly. There is no
new gray-white compartment lesion. No acute infarct apparent.

Bony calvarium appears intact. The mastoid air cells are clear.
There is patchy ethmoid sinus disease bilaterally. There is a
retention cyst in the inferior right maxillary antrum. There is
leftward deviation of the nasal septum.
IMPRESSION: There are several prior small infarcts, stable. There is patchy
periventricular small vessel disease. There is no acute appearing
infarct. There is no hemorrhage or mass effect.

## 2015-03-13 ENCOUNTER — Other Ambulatory Visit: Payer: Self-pay | Admitting: *Deleted

## 2015-03-13 NOTE — Patient Outreach (Signed)
Chris Payne) Care Management  03/13/2015  Chris Payne [birth year] [id number]  RN spoke with pt today and introduced the [facility] program and services. RN discussed previous telephone assessment with RN via [facility] office and explained the community home visits for further engagement concerning his medical issues. Pt states he has Dr. Percival Spanish for his CAD and Dr. Linna Darner for his primary. Pt states he has not seen his cardiologist  in several months and visits the Urgent Care on [address] In [place] on several occassions. Current issues related to bilateral swelling to his legs (history) and lots of mucus production. Pt also states the bottom of his feet "feels like pins" sticking in his feet. RN offered to call Dr. Percival Spanish office for an appointment concerning his ongoing swelling and sensation in his feet (receptived and agreed). Pt  states he is not on any fluid medication. Will contact Dr. Rosezella [place] office and request a call to the pt to arrange an appointment based upon his symptoms. RN educated pt on the HF zone and strongly encouraged on awareness related to contact his provider early to prevent acute signs and symptoms. Pt verbralizsed an understanding and again receptive to home visit with [facility] involvement. RN inquired on home scales and pt states he can get a scale. RN offered assistance however pt states he will be able to again obtain a scale. RN strongly encouraged pt to weigh daily around the same time in monitoring his fluid retained that can cause swelling to his lower legs/ankles, hands and trunk area. Stress the importance of prevention measures and reviewed the HF zones with information provided contact his provider if he gains 3 lbs overnight or 5 lbs in one week with any other symptoms of SOB or chest discomfort. States he has Nitroglycerin with the last dosing over a month ago with no chest pain or discomfort at this time. RN inquired on pt's COPD as pt states and confirms he  is taking his medications as prescribed with no problems or flare ups with his COPD.  RN extended a home visit for [facility] services and scheduled a visit conducive to pt's schedule.  Verified pt is aware when to contact his provider with any participating symptoms or distress related to his HF. RN will contact CAD office with the above information. No other request or inquires at this time.  Raina Mina, RN Care Management Coordinator [facility] Main Office [phone]

## 2015-03-13 NOTE — Patient Outreach (Signed)
[place] [facility]) Care Management  03/13/2015  NASHAUN VIVERITO [birth year] [id number]  RN contacted Dr. Percival Spanish and spoke with Butch Penny with pt's permission to request office to contact pt to arrange an appointment to address his ongoing swelling to his lower extremities and sensation to he bottom of his feet. RN has informed the representative that the pt indicates he is not on any fluid medication and this information is noted in EPIC. Dr. Percival Spanish office will follow up with pt and call for an appointment. No other inquires or request at this time. RN also inform office on the scheduled initial home visit date with this [facility] RN.  Raina Mina, RN Care Management Coordinator [place] [facility] Main Office [phone]

## 2015-03-22 ENCOUNTER — Other Ambulatory Visit: Payer: Self-pay | Admitting: *Deleted

## 2015-03-22 ENCOUNTER — Other Ambulatory Visit: Payer: Self-pay

## 2015-03-22 VITALS — BP 138/72 | HR 61 | Resp 20 | Ht 67.0 in | Wt 152.0 lb

## 2015-03-22 DIAGNOSIS — I5032 Chronic diastolic (congestive) heart failure: Secondary | ICD-10-CM

## 2015-03-22 MED ORDER — LEVETIRACETAM 500 MG PO TABS: 500.0000 mg | ORAL_TABLET | Freq: Two times a day (BID) | ORAL | Status: DC

## 2015-03-22 NOTE — Telephone Encounter (Signed)
Dr Tamala Julian sent in Rx for [place]

## 2015-03-22 NOTE — Patient Outreach (Signed)
[facility]) Care Management   03/22/2015  KALEI LEONGUERRERO [birth year] [id number]  Chris Payne is an 64 y.o. male  Subjective:  THN: Pt stats he is interested in enrolling into the [facility] program and services for community home visits. HF: Pt reports he has been weighing but needs new scales and remains within the parameters after the discussed today  Concerning HF zones and dry weight. Pt states he had some weight gain of 2 lbs over night. Pt denies any SOB however pt admits his ankles have some swelling but denies any other symptoms when HF was discussed. Pt not aware of the HF zones but has verified after educating pt today that he is in the YELLOW zone due to the current swelling to her legs.  MEDICATION: Pt verified all his medications today however reports he has been out of his Keppra for the pass week and has no additional refills.  Pt states he has informed his pharmacy who has requested additional refills but no call for pick up on this medication. Requested RN to assist with this matter.  PRODUCTIVE COUGH: Pt stats he has a productive cough with clear to white sputum but no fevers, chills and no other symptoms have occurred. States its in the center of his chest and difficult to "get up".   Objective:   Review of Systems  Constitutional: Negative.   HENT: Negative.   Eyes: Negative.   Respiratory: Positive for sputum production.        Pt reports white sputum with no fevers or chills.  Cardiovascular: Negative.   Gastrointestinal: Negative.   Genitourinary: Negative.   Musculoskeletal: Negative.   Skin: Negative.   Neurological: Negative.   Endo/Heme/Allergies: Negative.   Psychiatric/Behavioral: Negative.     Physical Exam  Constitutional: He is oriented to person, place, and time. He appears well-developed and well-nourished.  Cardiovascular: Normal heart sounds.   Neurological: He is alert and oriented to person, place, and time.  Skin: Skin is warm  and dry.  Psychiatric: He has a normal mood and affect. His behavior is normal. Judgment and thought content normal.    Current Medications:   Current Outpatient Prescriptions  Medication Sig Dispense Refill  . aspirin EC 81 MG tablet Take 81 mg by mouth every morning.     . B Complex-Folic Acid (B COMPLEX-VITAMIN B12) TABS Take 1 tablet by mouth daily. 30 tablet 5  . HYDROcodone-acetaminophen (NORCO) 10-325 MG per tablet Take 2 tablets by mouth every 4 (four) hours as needed for moderate pain.     . isosorbide mononitrate (IMDUR) 120 MG 24 hr tablet Take 1 tablet (120 mg total) by mouth daily. 30 tablet 10  . levETIRAcetam (KEPPRA) 500 MG tablet Take 1 tablet (500 mg total) by mouth 2 (two) times daily. 60 tablet 5  . morphine (MS CONTIN) 30 MG 12 hr tablet Take 30 mg by mouth every 12 (twelve) hours.     . nitroGLYCERIN (NITROSTAT) 0.4 MG SL tablet Place 0.4 mg under the tongue every 5 (five) minutes as needed for chest pain.     . ranolazine (RANEXA) 1000 MG SR tablet Take 1 tablet (1,000 mg total) by mouth 2 (two) times daily. 60 tablet 9  . rosuvastatin (CRESTOR) 40 MG tablet Take 1 tablet (40 mg total) by mouth daily. 30 tablet 6  . tamsulosin (FLOMAX) 0.4 MG CAPS capsule Take 0.4 mg by mouth at bedtime.     . carvedilol (COREG) 3.125 MG tablet Take  1 tablet (3.125 mg total) by mouth daily with breakfast. (Patient not taking: Reported on 02/08/2015) 30 tablet 1  . ciprofloxacin (CIPRO) 500 MG tablet Take 1 tablet (500 mg total) by mouth 2 (two) times daily. (Patient not taking: Reported on 03/22/2015) 14 tablet 0  . clopidogrel (PLAVIX) 75 MG tablet Take 1 tablet (75 mg total) by mouth daily with breakfast. (Patient not taking: Reported on 03/22/2015) 30 tablet 0  . furosemide (LASIX) 20 MG tablet Take 1 tablet (20 mg total) by mouth daily. (Patient not taking: Reported on 02/08/2015) 15 tablet 0  . gabapentin (NEURONTIN) 100 MG capsule TAKE 1 CAPSULE BY MOUTH EACH MORNING AND2 CAPSULES AT  BEDTIME AS DIRECTED (Patient not taking: Reported on 09/19/2014) 90 capsule 2  . metoprolol tartrate (LOPRESSOR) 25 MG tablet Take 0.5 tablets (12.5 mg total) by mouth 2 (two) times daily. (Patient not taking: Reported on 02/08/2015) 60 tablet 5   No current facility-administered medications for this visit.    Functional Status:   In your present state of health, do you have any difficulty performing the following activities: 03/22/2015  Hearing? N  Vision? N  Difficulty concentrating or making decisions? N  Walking or climbing stairs? Y  Dressing or bathing? Y  Doing errands, shopping? Y  Preparing Food and eating ? Y  Using the Toilet? N  In the past six months, have you accidently leaked urine? N  Do you have problems with loss of bowel control? N  Managing your Medications? Y  Managing your Finances? Y  Housekeeping or managing your Housekeeping? Y    Fall/Depression Screening:    PHQ 2/9 Scores 03/22/2015 03/09/2014 11/23/2013 07/21/2013  PHQ - 2 Score 0 0 0 0   Filed Vitals:   03/22/15 1114  BP: 138/72  Pulse: 61  Resp: 20    Assessment:    THN enrollment  Case management services related to HF  Bilateral edema related to lower extremities Non adherence related to prescription medication Productive cough related thick secretions  Plan:  Physical assessment completed with noted issues and interventions. Will enroll pt into the [facility] program and services for HF program and obtain a consent from via HIPAA  Requirement. Will discussed and ellaborate on the HF program and teachings related. Will discuss the HF zones and verified pt's zone today (yellow zone due to bilateral edema).  Will verify pt's weight over the last few days and document accordingly (today 152 lbs, yesterday and Monday was 150 lbs). Will discuss pt's bilateral edema and strongly encourage pt to elevate his lower legs to reduce ongoing swelling. Will discuss and educate pt on fluid retention related to  sodium intake and how this effects his heart. Compression stockings discussed if needed in the future for reducing his ongoing fluid retention. Will contact pt's primary and cardiologist concerning pt's swelling.  Will review all pt's medications and educated accordingly. Found a problem with one medication pt has not been taken (Keppra) due to no additional refills. Will contact pt's pharmacy and inquired on calling the provider for additional refills.   Pharmacy indicated they has requested refills on this medication to a Dr. Kenton Kingfisher but there was no response. Based upon the last visiting primary doctor RN contacted that office and verify pt is active with a primary doctor in that office. RN spoke with Katherina Mires concerning todays findings and medication issues related to the prescription needed for Keppra. Office nurse called back later while [facility] RN remains in the home and  verified the medication has been called into the pt's pharmacy with one of the pt's family members.  Will verify no fevers, chills or flu-like symptoms have occurred and discuss possible OTC medication (Mucinex) to assist with the thick secretions concerning pt's ongoing productive cough. Verify pt's sputum remains clear to white in color. Will strongly encouraged pt to contact his provider if fevers, chills or flu-like symptoms should occur along with any discoloration in his productive cough occur.  Plan of care and goals discussed as pt agreed he is will to change in order to improve his overall health. Plan to schedule another home visit in a few weeks to address pt's plan of care and reiterate on his goals if needed. No other inquired or request at this time. Will send initial home visit reports and letter to primary concerning pt's enrollment into the [facility] program and services.   Raina Mina, RN Care Management Coordinator [facility] Main Office [phone]

## 2015-03-22 NOTE — Telephone Encounter (Signed)
Raina Mina, RN from [facility], called to request that Dr Tamala Julian RF pt's Keppra through [facility]. Last filled by a Dr Kenton Kingfisher who the pharm can not find in their system. Lattie Haw reported that pt states he uses [place] as his PCP, so she is requesting this Rx (all other meds are being managed by pt's cardiologist). Lattie Haw will also send Dr Tamala Julian pt's reports since she is the last PCP pt has seen. Pended for review. Lattie Haw asked [place] to let pt know when this has been sent in instead of her, but she can be reached at [phone] if needed.

## 2015-03-22 NOTE — Telephone Encounter (Signed)
Notified pt and Lattie Haw was there with him. I spoke to her regarding pt's 2+ edema bilaterally in lower extremities. No SOB, resting O2 sats room air 97%, vitals good. Pt has NOT gained significant weight over the last several days. Pt is congested but only in upper chest and is taking Mucinex. Dr Hochein's office is closed today, so Lattie Haw wanted Dr Tamala Julian to know, but she thinks pt will be fine until Monday when Dr Michelle Piper can address whether pt should be on a fluid pill. Lattie Haw has advised pt to come in to be seen if edema worsens before then. Dr Tamala Julian, [place]ier

## 2015-03-26 ENCOUNTER — Encounter: Payer: Self-pay | Admitting: *Deleted

## 2015-03-26 ENCOUNTER — Ambulatory Visit (INDEPENDENT_AMBULATORY_CARE_PROVIDER_SITE_OTHER): Payer: Medicare Other | Admitting: Physician Assistant

## 2015-03-26 ENCOUNTER — Encounter: Payer: Self-pay | Admitting: Physician Assistant

## 2015-03-26 ENCOUNTER — Other Ambulatory Visit: Payer: Self-pay | Admitting: *Deleted

## 2015-03-26 VITALS — BP 98/66 | HR 67 | Ht 67.0 in | Wt 134.5 lb

## 2015-03-26 DIAGNOSIS — R600 Localized edema: Secondary | ICD-10-CM | POA: Diagnosis not present

## 2015-03-26 DIAGNOSIS — I739 Peripheral vascular disease, unspecified: Secondary | ICD-10-CM

## 2015-03-26 NOTE — Patient Instructions (Signed)
Your physician has requested that you have an ankle brachial index (ABI). During this test an ultrasound and blood pressure cuff are used to evaluate the arteries that supply the arms and legs with blood. Allow thirty minutes for this exam. There are no restrictions or special instructions.   Your physician recommends that you schedule a follow-up appointment in: March with Dr. Percival Spanish. Also you will be scheduled to have a carotid and abdominal duplex in March prior to your appointment with Dr. Percival Spanish.

## 2015-03-26 NOTE — Progress Notes (Signed)
Cardiology Office Note   Date:  03/26/2015   ID:  Chris Payne, DOB [birth year], MRN [id number]  PCP:  Chris Forts, MD  Cardiologist:   Dr. Mardene Celeste, PA-C   Chief Complaint  Patient presents with  . Follow-up    pt states no chest pain no SOB no light headedness otr dizziness,   . Edema    c/o having edema in both feet feels like little needles sticking him at night    History of Present Illness: Chris Payne is a 64 y.o. male with a history of AAA w/ repair 2011, CABG 2012, cath 06/2013 w/ patent LIMA-LAD (distal dz treated medically, occluded SVG-D & SVG-AM, SVG-RI OK (no cath unless STEMI), PAD, HTN, HL, COPD, GERD.   Chris Payne presents for evaluation of lower extremity pain and edema.  Chris Payne has lower extremity edema that is mostly during the day. He states he does not wake up with swelling in his legs but does have it during the day. When he lies down to go to bed at night, he will fill that he has pins and needles in his legs. He walks very little and ambulates a little bit in the house, otherwise using a wheelchair. He denies dyspnea on exertion or chest pain. He is compliant with his medications. His feet are cold and he wonders if the circulation to them is okay. He has nighttime leg cramps at times, but no problems with ambulation. He has quit smoking but 2 people smoke in the house with him.   Past Medical History  Diagnosis Date  . Hypertension   . Stroke [facility])     h/o pontine CVA  . AAA (abdominal aortic aneurysm) ([place])     s/p repair 6/11  . Hyperlipidemia   . CAD (coronary artery disease)     s/p CABG 2/12:  LIMA to LAD, SVG to diagonal-1, SVG to ramus intermedius,, SVG to AM (Dr. Roxan Hockey) ;  b.  Myoview 10/13:  inf infarct with very mild peri-infarct ischemia, EF 49%, inf HK; c  He has severe three-vessel native coronary artery disease. Catheterization March 2015 as above  . Carotid stenosis     a.  dopplers 0000000: LICA  0000000;  b. Carotid dopplers 3/14:  R 0-39%, L 60-79%, repeat 6 mos  . PAD (peripheral artery disease) ([place])     ABIs 4/12:  R 0.88, L 0.92; R SFA 40%, L CFA 50%, L SFA 50-60%  . COPD (chronic obstructive pulmonary disease) ([place])   . Inguinal hernia   . Thyroid nodule     incidental finding on carotid doppler 3/14 => thyroid U/S ordered  . Pneumonia     hx  . GERD (gastroesophageal reflux disease)   . Myocardial infarction [facility])     PCI x3  . [id number])     Past Surgical History  Procedure Laterality Date  . Hip surgery  40 years ago    left hip bone removal and pinning  . Abdominal aortic aneurysm repair  10-03-2009  . Exploratory laparotomy  09/2009    ligation of lumbar arteries  . Coronary artery bypass graft  06/24/2010    LIMA to the LAD, SVG to first diagonal, SVG to ramus intermediate, SVG to acute marginal. EF 50%. 2/12  . External ear surgery Left     laceration child  . Inguinal hernia repair Left 07/21/2012    Procedure: HERNIA REPAIR INGUINAL ADULT;  Surgeon: Merrie Roof, MD;  Location: [facility] OR;  Service: General;  Laterality: Left;  . Insertion of mesh Left 07/21/2012    Procedure: INSERTION OF MESH;  Surgeon: Merrie Roof, MD;  Location: [place];  Service: General;  Laterality: Left;  . Exploration post operative open heart    . Nm myocar perf wall motion  12/02/2010    inferoapical defect is fixed c/w prior infarction/scar, there is minimal borderzone ischemia.  . Left heart catheterization with coronary/graft angiogram  06/21/2012    Procedure: LEFT HEART CATHETERIZATION WITH Chris Payne;  Surgeon: Minus Breeding, MD;  Location: [facility] CATH LAB;  Service: Cardiovascular;;  . Left heart catheterization with coronary/graft angiogram N/A 07/04/2013    Procedure: LEFT HEART CATHETERIZATION WITH Chris Payne;  Surgeon: Sinclair Grooms, MD;  Location: [facility] CATH LAB;  Service: Cardiovascular;  Laterality: N/A;    Current Outpatient Prescriptions    Medication Sig Dispense Refill  . aspirin EC 81 MG tablet Take 81 mg by mouth every morning.     . B Complex-Folic Acid (B COMPLEX-VITAMIN B12) TABS Take 1 tablet by mouth daily. 30 tablet 5  . carvedilol (COREG) 3.125 MG tablet Take 1 tablet (3.125 mg total) by mouth daily with breakfast. 30 tablet 1  . ciprofloxacin (CIPRO) 500 MG tablet Take 1 tablet (500 mg total) by mouth 2 (two) times daily. 14 tablet 0  . clopidogrel (PLAVIX) 75 MG tablet Take 1 tablet (75 mg total) by mouth daily with breakfast. 30 tablet 0  . furosemide (LASIX) 20 MG tablet Take 1 tablet (20 mg total) by mouth daily. 15 tablet 0  . gabapentin (NEURONTIN) 100 MG capsule TAKE 1 CAPSULE BY MOUTH EACH MORNING AND2 CAPSULES AT BEDTIME AS DIRECTED 90 capsule 2  . HYDROcodone-acetaminophen (NORCO) 10-325 MG per tablet Take 2 tablets by mouth every 4 (four) hours as needed for moderate pain.     . isosorbide mononitrate (IMDUR) 120 MG 24 hr tablet Take 1 tablet (120 mg total) by mouth daily. 30 tablet 10  . levETIRAcetam (KEPPRA) 500 MG tablet Take 1 tablet (500 mg total) by mouth 2 (two) times daily. 60 tablet 5  . metoprolol tartrate (LOPRESSOR) 25 MG tablet Take 0.5 tablets (12.5 mg total) by mouth 2 (two) times daily. 60 tablet 5  . morphine (MS CONTIN) 30 MG 12 hr tablet Take 30 mg by mouth every 12 (twelve) hours.     . nitroGLYCERIN (NITROSTAT) 0.4 MG SL tablet Place 0.4 mg under the tongue every 5 (five) minutes as needed for chest pain.     . ranolazine (RANEXA) 1000 MG SR tablet Take 1 tablet (1,000 mg total) by mouth 2 (two) times daily. 60 tablet 9  . rosuvastatin (CRESTOR) 40 MG tablet Take 1 tablet (40 mg total) by mouth daily. 30 tablet 6  . tamsulosin (FLOMAX) 0.4 MG CAPS capsule Take 0.4 mg by mouth at bedtime.      No current facility-administered medications for this visit.    Allergies:   Review of patient's allergies indicates no known allergies.    Social History:  The patient  reports that he quit  smoking about 5 years ago. His smoking use included Cigarettes. He has a 30 pack-year smoking history. He has never used smokeless tobacco. He reports that he does not drink alcohol or use illicit drugs.   Family History:  The patient's family history includes Alcohol abuse in his father; Cancer in his sister.    ROS:  Please see the history of present  illness. All other systems are reviewed and negative.    PHYSICAL EXAM: VS:  BP 98/66 mmHg  Pulse 67  Ht 5\' 7"  (1.702 m)  Wt 134 lb 8 oz (61.009 kg)  BMI 21.06 kg/m2 , BMI Body mass index is 21.06 kg/(m^2). GEN: Well nourished, well developed, male in no acute distress HEENT: normal for age  Neck: no JVD, no carotid bruit, no masses Cardiac: RRR; soft murmur, no rubs, or gallops Respiratory:  clear to auscultation bilaterally, normal work of breathing GI: soft, nontender, nondistended, + BS MS: no deformity or atrophy; trace pedal edema; distal pulses are 2+ in upper extremities, distal pulses are decreased but palpable in both lower extremities. Capillary refill is 5-7 seconds  Skin: warm and dry, no rash Neuro:  Strength and sensation are intact Psych: euthymic mood, full affect   EKG:  EKG is not ordered today.  Recent Labs: 06/15/2014: ALT 13 09/19/2014: B Natriuretic Peptide 51.3 02/08/2015: BUN 11; Creatinine, Ser 1.07; Hemoglobin 16.1; Platelets 155; Potassium 4.1; Sodium 139    Lipid Panel    Component Value Date/Time   CHOL 253* 06/15/2014 1057   TRIG 120 06/15/2014 1057   HDL 56 06/15/2014 1057   CHOLHDL 4.5 06/15/2014 1057   VLDL 24 06/15/2014 1057   LDLCALC 173* 06/15/2014 1057     Wt Readings from Last 3 Encounters:  03/26/15 134 lb 8 oz (61.009 kg)  03/22/15 152 lb (68.947 kg)  02/08/15 148 lb 3.2 oz (67.223 kg)     Other studies Reviewed: Additional studies/ records that were reviewed today include: Previous office notes, cath reports.  ASSESSMENT AND PLAN:  1.  Lower extremity edema: He has no  systemic volume overload. The edema is mostly during the daytime. He has mild pedal edema right now, but has been sitting with his legs down for several hours. No medication changes for this at this time, but he is encouraged to contact us if he begins to notice edema when he wakes or increased dyspnea on exertion.  2. History of PAD: He has carotid Dopplers and an abdominal ultrasound scheduled for March. He has not had ABIs since 2014. We will obtain these and if they are decreased, will discuss with M.D. He is compliant with his aspirin and Plavix I'm not sure Pletal would help. In 2014, he only had mild arterial insufficiency.   Current medicines are reviewed at length with the patient today.  The patient does not have concerns regarding medicines.  The following changes have been made:  no change  Labs/ tests ordered today include: No orders of the defined types were placed in this encounter.     Disposition:   FU with Dr. Percival Spanish as scheduled  Signed, Lenoard Aden  03/26/2015 4:33 PM    [facility] [address], [place], [place]  [address] Phone: [phone]; Fax: [phone]

## 2015-03-26 NOTE — Patient Outreach (Signed)
[facility]) Care Management  03/26/2015  Chris Payne December 22, 1950 [id number]   Phone call to patient to assess resources for in home personal care services. HIPPA compliant voicemail message left for a return call.    [name] [facility] Care Management [phone]

## 2015-03-26 NOTE — Progress Notes (Signed)
Appointment made for 05/10/15 @ 10:15 am for a follow-up and establish care visit with Dr Tamala Julian.

## 2015-04-03 ENCOUNTER — Inpatient Hospital Stay (HOSPITAL_COMMUNITY): Admission: RE | Admit: 2015-04-03 | Payer: Medicare Other | Source: Ambulatory Visit

## 2015-04-03 ENCOUNTER — Telehealth: Payer: Self-pay | Admitting: Cardiology

## 2015-04-03 NOTE — Telephone Encounter (Signed)
No CHF dx

## 2015-04-03 NOTE — Telephone Encounter (Signed)
Sonia Baller called in stating that she is trying to enroll the pt into the Heart Failure program but first she needed to know if the pt had a diagnosis code of CHF . She will also need his most recent EF and BP and HR. She says that if you are not able to reach her , you can leave the information on her voicemail. Please f/u   Thanks

## 2015-04-09 ENCOUNTER — Other Ambulatory Visit: Payer: Self-pay | Admitting: *Deleted

## 2015-04-09 NOTE — Patient Outreach (Signed)
[facility]) Care Management  04/09/2015  Chris Payne 1951-02-23 [id number]   Referral received from [facility] to assist patient with resources for housekeeping.  Per patient, he does not qualify for Medicaid.  He further explained that he mainly need assistance with housekeeping.  Plan:  This social worker will send request to care management assistant to send patient resources for housekeeping services in his area.   [name] [facility] Care Management [phone]

## 2015-04-11 DIAGNOSIS — G8929 Other chronic pain: Secondary | ICD-10-CM | POA: Diagnosis not present

## 2015-04-11 DIAGNOSIS — M488X6 Other specified spondylopathies, lumbar region: Secondary | ICD-10-CM | POA: Diagnosis not present

## 2015-04-11 DIAGNOSIS — M47816 Spondylosis without myelopathy or radiculopathy, lumbar region: Secondary | ICD-10-CM | POA: Diagnosis not present

## 2015-04-11 NOTE — Patient Outreach (Signed)
[place] [facility] [facility]) Care Management  04/11/2015  AOUS MODEL 03-Aug-1950 [id number]   Request received from [facility], LCSW to mail patient information on private duty aids as well as community housekeeping resources. Mailed patient information on 04/11/15  [place] Care Management Assistant

## 2015-04-16 ENCOUNTER — Encounter (HOSPITAL_COMMUNITY): Payer: Self-pay | Admitting: Emergency Medicine

## 2015-04-16 ENCOUNTER — Emergency Department (HOSPITAL_COMMUNITY): Payer: Medicare Other

## 2015-04-16 ENCOUNTER — Observation Stay (HOSPITAL_COMMUNITY)
Admission: EM | Admit: 2015-04-16 | Discharge: 2015-04-17 | Disposition: A | Discharge status: Home / Self Care | Payer: Medicare Other | Attending: Internal Medicine | Admitting: Internal Medicine

## 2015-04-16 DIAGNOSIS — I517 Cardiomegaly: Secondary | ICD-10-CM | POA: Diagnosis not present

## 2015-04-16 DIAGNOSIS — M50221 Other cervical disc displacement at C4-C5 level: Secondary | ICD-10-CM | POA: Diagnosis not present

## 2015-04-16 DIAGNOSIS — K219 Gastro-esophageal reflux disease without esophagitis: Secondary | ICD-10-CM | POA: Diagnosis not present

## 2015-04-16 DIAGNOSIS — N400 Enlarged prostate without lower urinary tract symptoms: Secondary | ICD-10-CM | POA: Diagnosis not present

## 2015-04-16 DIAGNOSIS — R404 Transient alteration of awareness: Secondary | ICD-10-CM | POA: Diagnosis not present

## 2015-04-16 DIAGNOSIS — I2510 Atherosclerotic heart disease of native coronary artery without angina pectoris: Secondary | ICD-10-CM | POA: Diagnosis not present

## 2015-04-16 DIAGNOSIS — R569 Unspecified convulsions: Secondary | ICD-10-CM | POA: Insufficient documentation

## 2015-04-16 DIAGNOSIS — E785 Hyperlipidemia, unspecified: Secondary | ICD-10-CM | POA: Diagnosis present

## 2015-04-16 DIAGNOSIS — Z8679 Personal history of other diseases of the circulatory system: Secondary | ICD-10-CM | POA: Diagnosis not present

## 2015-04-16 DIAGNOSIS — Z8673 Personal history of transient ischemic attack (TIA), and cerebral infarction without residual deficits: Secondary | ICD-10-CM | POA: Diagnosis not present

## 2015-04-16 DIAGNOSIS — I10 Essential (primary) hypertension: Secondary | ICD-10-CM | POA: Diagnosis not present

## 2015-04-16 DIAGNOSIS — M549 Dorsalgia, unspecified: Secondary | ICD-10-CM | POA: Insufficient documentation

## 2015-04-16 DIAGNOSIS — Z951 Presence of aortocoronary bypass graft: Secondary | ICD-10-CM | POA: Diagnosis not present

## 2015-04-16 DIAGNOSIS — I252 Old myocardial infarction: Secondary | ICD-10-CM | POA: Diagnosis not present

## 2015-04-16 DIAGNOSIS — Z9889 Other specified postprocedural states: Secondary | ICD-10-CM

## 2015-04-16 DIAGNOSIS — G8929 Other chronic pain: Secondary | ICD-10-CM | POA: Diagnosis not present

## 2015-04-16 DIAGNOSIS — Z7982 Long term (current) use of aspirin: Secondary | ICD-10-CM | POA: Diagnosis not present

## 2015-04-16 DIAGNOSIS — J449 Chronic obstructive pulmonary disease, unspecified: Secondary | ICD-10-CM | POA: Diagnosis present

## 2015-04-16 DIAGNOSIS — I739 Peripheral vascular disease, unspecified: Secondary | ICD-10-CM | POA: Diagnosis not present

## 2015-04-16 DIAGNOSIS — M4802 Spinal stenosis, cervical region: Secondary | ICD-10-CM | POA: Insufficient documentation

## 2015-04-16 DIAGNOSIS — Z7902 Long term (current) use of antithrombotics/antiplatelets: Secondary | ICD-10-CM | POA: Insufficient documentation

## 2015-04-16 DIAGNOSIS — D751 Secondary polycythemia: Secondary | ICD-10-CM | POA: Diagnosis present

## 2015-04-16 DIAGNOSIS — R531 Weakness: Secondary | ICD-10-CM | POA: Diagnosis not present

## 2015-04-16 LAB — I-STAT TROPONIN, ED: Troponin i, poc: 0 ng/mL (ref 0.00–0.08)

## 2015-04-16 LAB — DIFFERENTIAL
BASOS ABS: 0 10*3/uL (ref 0.0–0.1)
BASOS PCT: 0 %
EOS ABS: 0.1 10*3/uL (ref 0.0–0.7)
Eosinophils Relative: 2 %
LYMPHS ABS: 2.2 10*3/uL (ref 0.7–4.0)
Lymphocytes Relative: 33 %
Monocytes Absolute: 0.7 10*3/uL (ref 0.1–1.0)
Monocytes Relative: 10 %
NEUTROS PCT: 55 %
Neutro Abs: 3.5 10*3/uL (ref 1.7–7.7)

## 2015-04-16 LAB — COMPREHENSIVE METABOLIC PANEL
ALBUMIN: 3.1 g/dL — AB (ref 3.5–5.0)
ALT: 19 U/L (ref 17–63)
AST: 19 U/L (ref 15–41)
Alkaline Phosphatase: 60 U/L (ref 38–126)
Anion gap: 10 (ref 5–15)
BUN: 12 mg/dL (ref 6–20)
CHLORIDE: 106 mmol/L (ref 101–111)
CO2: 25 mmol/L (ref 22–32)
Calcium: 9.1 mg/dL (ref 8.9–10.3)
Creatinine, Ser: 0.79 mg/dL (ref 0.61–1.24)
GFR calc Af Amer: >60 mL/min (ref >60)
GFR calc non Af Amer: >60 mL/min (ref >60)
GLUCOSE: 130 mg/dL — AB (ref 65–99)
POTASSIUM: 4.5 mmol/L (ref 3.5–5.1)
Sodium: 141 mmol/L (ref 135–145)
Total Bilirubin: 0.9 mg/dL (ref 0.3–1.2)
Total Protein: 5.7 g/dL — ABNORMAL LOW (ref 6.5–8.1)

## 2015-04-16 LAB — APTT: APTT: 34 s (ref 24–37)

## 2015-04-16 LAB — PROTIME-INR
INR: 1.07 (ref 0.00–1.49)
Prothrombin Time: 14.1 seconds (ref 11.6–15.2)

## 2015-04-16 LAB — I-STAT CHEM 8, ED
BUN: 15 mg/dL (ref 6–20)
CREATININE: 0.7 mg/dL (ref 0.61–1.24)
Calcium, Ion: 1.12 mmol/L — ABNORMAL LOW (ref 1.13–1.30)
Chloride: 103 mmol/L (ref 101–111)
Glucose, Bld: 120 mg/dL — ABNORMAL HIGH (ref 65–99)
HEMATOCRIT: 52 % (ref 39.0–52.0)
Hemoglobin: 17.7 g/dL — ABNORMAL HIGH (ref 13.0–17.0)
POTASSIUM: 4.4 mmol/L (ref 3.5–5.1)
Sodium: 141 mmol/L (ref 135–145)
TCO2: 28 mmol/L (ref 0–100)

## 2015-04-16 LAB — CBC
HCT: 50.7 % (ref 39.0–52.0)
HEMOGLOBIN: 16.7 g/dL (ref 13.0–17.0)
MCH: 34.7 pg — ABNORMAL HIGH (ref 26.0–34.0)
MCHC: 32.9 g/dL (ref 30.0–36.0)
MCV: 105.4 fL — ABNORMAL HIGH (ref 78.0–100.0)
Platelets: 163 10*3/uL (ref 150–400)
RBC: 4.81 MIL/uL (ref 4.22–5.81)
RDW: 12.9 % (ref 11.5–15.5)
WBC: 6.5 10*3/uL (ref 4.0–10.5)

## 2015-04-16 NOTE — ED Provider Notes (Signed)
CSN: [id number]     Arrival date & time 04/16/15  2242 History  By signing my name below, I, Irene Pap, attest that this documentation has been prepared under the direction and in the presence of Elsie Baynes, MD. Electronically Signed: Irene Pap, ED Scribe. 04/16/2015. 1:47 AM.  Chief Complaint  Patient presents with  . Weakness   The history is provided by the patient. No language interpreter was used.  HPI Comments (Level 5 Caveat due to condition of patient): Chris Payne is a 64 y.o. Male with a hx of stroke, HTN, seizures, CAD, PAD, COPD, AAA, and MI brought in by EMS who presents to the Emergency Department complaining of weakness onset 2 hours ago. Pt was last seen at baseline at 9:30 PM; his family states that he was weaker than he has ever been and pt was complaining of chronic back pain and being "very hot." Per EMS, pt has a hx of stroke with bilateral weakness and muscle tremors. VS: HR 50; BP 140/72; 96% RA; CBG 108.   Past Medical History  Diagnosis Date  . Hypertension   . Stroke [facility])     h/o pontine CVA  . AAA (abdominal aortic aneurysm) ([place])     s/p repair 6/11  . Hyperlipidemia   . CAD (coronary artery disease)     s/p CABG 2/12:  LIMA to LAD, SVG to diagonal-1, SVG to ramus intermedius,, SVG to AM (Dr. Roxan Hockey) ;  b.  Myoview 10/13:  inf infarct with very mild peri-infarct ischemia, EF 49%, inf HK; c  He has severe three-vessel native coronary artery disease. Catheterization March 2015 as above  . Carotid stenosis     a.  dopplers 0000000: LICA 0000000;  b. Carotid dopplers 3/14:  R 0-39%, L 60-79%, repeat 6 mos  . PAD (peripheral artery disease) ([place])     ABIs 4/12:  R 0.88, L 0.92; R SFA 40%, L CFA 50%, L SFA 50-60%  . COPD (chronic obstructive pulmonary disease) ([place])   . Inguinal hernia   . Thyroid nodule     incidental finding on carotid doppler 3/14 => thyroid U/S ordered  . Pneumonia     hx  . GERD (gastroesophageal reflux disease)   .  Myocardial infarction [facility])     PCI x3  . [id number])    Past Surgical History  Procedure Laterality Date  . Hip surgery  40 years ago    left hip bone removal and pinning  . Abdominal aortic aneurysm repair  10-03-2009  . Exploratory laparotomy  09/2009    ligation of lumbar arteries  . Coronary artery bypass graft  06/24/2010    LIMA to the LAD, SVG to first diagonal, SVG to ramus intermediate, SVG to acute marginal. EF 50%. 2/12  . External ear surgery Left     laceration child  . Inguinal hernia repair Left 07/21/2012    Procedure: HERNIA REPAIR INGUINAL ADULT;  Surgeon: Merrie Roof, MD;  Location: [place];  Service: General;  Laterality: Left;  . Insertion of mesh Left 07/21/2012    Procedure: INSERTION OF MESH;  Surgeon: Merrie Roof, MD;  Location: [place];  Service: General;  Laterality: Left;  . Exploration post operative open heart    . Nm myocar perf wall motion  12/02/2010    inferoapical defect is fixed c/w prior infarction/scar, there is minimal borderzone ischemia.  . Left heart catheterization with coronary/graft angiogram  06/21/2012    Procedure: LEFT HEART  CATHETERIZATION WITH Beatrix Fetters;  Surgeon: Minus Breeding, MD;  Location: [facility] CATH LAB;  Service: Cardiovascular;;  . Left heart catheterization with coronary/graft angiogram N/A 07/04/2013    Procedure: LEFT HEART CATHETERIZATION WITH Beatrix Fetters;  Surgeon: Sinclair Grooms, MD;  Location: [facility] C[facility] LAB;  Service: Cardiovascular;  Laterality: N/A;   Family History  Problem Relation Age of Onset  . Alcohol abuse Father   . Cancer Sister    Social History  Substance Use Topics  . Smoking status: Former Smoker -- 1.00 packs/day for 30 years    Types: Cigarettes    Quit date: 01/25/2010  . Smokeless tobacco: Never Used  . Alcohol Use: No     Comment: former drinker - Quit 2011.    Review of Systems  Unable to perform ROS: Acuity of condition  Constitutional: Negative for fever.   Eyes: Negative for photophobia.  Gastrointestinal: Negative for vomiting.  Skin: Negative for rash.   Allergies  Review of patient's allergies indicates no known allergies.  Home Medications   Prior to Admission medications   Medication Sig Start Date End Date Taking? Authorizing Provider  aspirin EC 81 MG tablet Take 81 mg by mouth every morning.     Historical Provider, MD  B Complex-Folic Acid (B COMPLEX-VITAMIN B12) TABS Take 1 tablet by mouth daily. 11/26/13   Barton Fanny, MD  carvedilol (COREG) 3.125 MG tablet Take 1 tablet (3.125 mg total) by mouth daily with breakfast. 10/26/13   Minus Breeding, MD  ciprofloxacin (CIPRO) 500 MG tablet Take 1 tablet (500 mg total) by mouth 2 (two) times daily. 02/08/15   Antony Blackbird, MD  clopidogrel (PLAVIX) 75 MG tablet Take 1 tablet (75 mg total) by mouth daily with breakfast. 09/23/13   Bernadene Bell, MD  furosemide (LASIX) 20 MG tablet Take 1 tablet (20 mg total) by mouth daily. 09/20/14   Nicole Pisciotta, PA-C  gabapentin (NEURONTIN) 100 MG capsule TAKE 1 CAPSULE BY MOUTH EACH MORNING AND2 CAPSULES AT BEDTIME AS DIRECTED 02/21/14   Barton Fanny, MD  HYDROcodone-acetaminophen [facility]) 10-325 MG per tablet Take 2 tablets by mouth every 4 (four) hours as needed for moderate pain.     Historical Provider, MD  isosorbide mononitrate (IMDUR) 120 MG 24 hr tablet Take 1 tablet (120 mg total) by mouth daily. 07/27/14   Minus Breeding, MD  levETIRAcetam (KEPPRA) 500 MG tablet Take 1 tablet (500 mg total) by mouth 2 (two) times daily. 03/22/15   Wardell Honour, MD  metoprolol tartrate (LOPRESSOR) 25 MG tablet Take 0.5 tablets (12.5 mg total) by mouth 2 (two) times daily. 08/10/14   Minus Breeding, MD  morphine (MS CONTIN) 30 MG 12 hr tablet Take 30 mg by mouth every 12 (twelve) hours.  07/07/13   Historical Provider, MD  nitroGLYCERIN (NITROSTAT) 0.4 MG SL tablet Place 0.4 mg under the tongue every 5 (five) minutes as needed for chest pain.  06/01/13    Minus Breeding, MD  ranolazine (RANEXA) 1000 MG SR tablet Take 1 tablet (1,000 mg total) by mouth 2 (two) times daily. 08/06/14   Minus Breeding, MD  rosuvastatin (CRESTOR) 40 MG tablet Take 1 tablet (40 mg total) by mouth daily. 02/15/15   Minus Breeding, MD  tamsulosin (FLOMAX) 0.4 MG CAPS capsule Take 0.4 mg by mouth at bedtime.     Historical Provider, MD   BP 152/76 mmHg  Pulse 51  Temp(Src) 97.4 F (36.3 C) (Oral)  Ht 5\' 7"  (1.702 m)  Wt 134 lb (60.782 kg)  BMI 20.98 kg/m2  SpO2 95% Physical Exam  Constitutional: He appears well-developed and well-nourished.  HENT:  Head: Normocephalic and atraumatic.  Mouth/Throat: Oropharynx is clear and moist.  Trachea midline  Eyes: EOM are normal. Pupils are equal, round, and reactive to light.  Neck: Normal range of motion. Neck supple. No JVD present. Carotid bruit is not present.  Cardiovascular: Normal rate, regular rhythm and normal heart sounds.  Exam reveals no gallop and no friction rub.   No murmur heard. Pulmonary/Chest: Effort normal and breath sounds normal. No stridor. He has no wheezes. He has no rales.  Abdominal: Soft. Bowel sounds are normal. There is no tenderness. There is no rebound.  Musculoskeletal: Normal range of motion.  Lymphadenopathy:    He has no cervical adenopathy.  Neurological: He is alert. He has normal reflexes. He displays normal reflexes. He exhibits normal muscle tone.  Reflex Scores:      Patellar reflexes are 2+ on the right side and 2+ on the left side. DP pulses bilaterally; compartments soft; no pronator drift.    Skin: Skin is warm and dry.  Psychiatric: He has a normal mood and affect. His behavior is normal.  Nursing note and vitals reviewed.   ED Course  Procedures (including critical care time) DIAGNOSTIC STUDIES: Oxygen Saturation is 95% on RA, adequate by my interpretation.    COORDINATION OF CARE: 11:16 PM-Labs and CT scan; chest x-ray.    Labs Review Labs Reviewed   PROTIME-INR  APTT  CBC  DIFFERENTIAL  COMPREHENSIVE METABOLIC PANEL  I-STAT TROPOININ, ED  CBG MONITORING, ED  I-STAT CHEM 8, ED    Imaging Review No results found. I have personally reviewed and evaluated these images and lab results as part of my medical decision-making.     EKG Interpretation  Date/Time:  Tuesday April 16 2015 22:59:25 EST Ventricular Rate:  51 PR Interval:  172 QRS Duration: 110 QT Interval:  513 QTC Calculation: 472 R Axis:   -29 Text Interpretation:  Sinus rhythm Abnormal R-wave progression, early transition Confirmed by [facility]  MD, Linnaea Ahn ([id number]) on 04/16/2015 11:04:33 PM        MDM   Final diagnoses:  None    Results for orders placed or performed during the hospital encounter of 04/16/15  Protime-INR  Result Value Ref Range   Prothrombin Time 14.1 11.6 - 15.2 seconds   INR 1.07 0.00 - 1.49  APTT  Result Value Ref Range   aPTT 34 24 - 37 seconds  CBC  Result Value Ref Range   WBC 6.5 4.0 - 10.5 K/uL   RBC 4.81 4.22 - 5.81 MIL/uL   Hemoglobin 16.7 13.0 - 17.0 g/dL   HCT 50.7 39.0 - 52.0 %   MCV 105.4 (H) 78.0 - 100.0 fL   MCH 34.7 (H) 26.0 - 34.0 pg   MCHC 32.9 30.0 - 36.0 g/dL   RDW 12.9 11.5 - 15.5 %   Platelets 163 150 - 400 K/uL  Differential  Result Value Ref Range   Neutrophils Relative % 55 %   Neutro Abs 3.5 1.7 - 7.7 K/uL   Lymphocytes Relative 33 %   Lymphs Abs 2.2 0.7 - 4.0 K/uL   Monocytes Relative 10 %   Monocytes Absolute 0.7 0.1 - 1.0 K/uL   Eosinophils Relative 2 %   Eosinophils Absolute 0.1 0.0 - 0.7 K/uL   Basophils Relative 0 %   Basophils Absolute 0.0 0.0 - 0.1 K/uL  Comprehensive  metabolic panel  Result Value Ref Range   Sodium 141 135 - 145 mmol/L   Potassium 4.5 3.5 - 5.1 mmol/L   Chloride 106 101 - 111 mmol/L   CO2 25 22 - 32 mmol/L   Glucose, Bld 130 (H) 65 - 99 mg/dL   BUN 12 6 - 20 mg/dL   Creatinine, Ser 0.79 0.61 - 1.24 mg/dL   Calcium 9.1 8.9 - 10.3 mg/dL   Total Protein 5.7  (L) 6.5 - 8.1 g/dL   Albumin 3.1 (L) 3.5 - 5.0 g/dL   AST 19 15 - 41 U/L   ALT 19 17 - 63 U/L   Alkaline Phosphatase 60 38 - 126 U/L   Total Bilirubin 0.9 0.3 - 1.2 mg/dL   GFR calc non Af Amer >60 >60 mL/min   GFR calc Af Amer >60 >60 mL/min   Anion gap 10 5 - 15  Urinalysis, Routine w reflex microscopic (not at [facility])  Result Value Ref Range   Color, Urine AMBER (A) YELLOW   APPearance CLEAR CLEAR   Specific Gravity, Urine 1.018 1.005 - 1.030   pH 6.5 5.0 - 8.0   Glucose, UA NEGATIVE NEGATIVE mg/dL   Hgb urine dipstick NEGATIVE NEGATIVE   Bilirubin Urine SMALL (A) NEGATIVE   Ketones, ur NEGATIVE NEGATIVE mg/dL   Protein, ur NEGATIVE NEGATIVE mg/dL   Nitrite NEGATIVE NEGATIVE   Leukocytes, UA NEGATIVE NEGATIVE  I-stat troponin, ED (not at [facility])  Result Value Ref Range   Troponin i, poc 0.00 0.00 - 0.08 ng/mL   Comment 3          CBG monitoring, ED  Result Value Ref Range   Glucose-Capillary 116 (H) 65 - 99 mg/dL  I-Stat Chem 8, ED  (not at [facility])  Result Value Ref Range   Sodium 141 135 - 145 mmol/L   Potassium 4.4 3.5 - 5.1 mmol/L   Chloride 103 101 - 111 mmol/L   BUN 15 6 - 20 mg/dL   Creatinine, Ser 0.70 0.61 - 1.24 mg/dL   Glucose, Bld 120 (H) 65 - 99 mg/dL   Calcium, Ion 1.12 (L) 1.13 - 1.30 mmol/L   TCO2 28 0 - 100 mmol/L   Hemoglobin 17.7 (H) 13.0 - 17.0 g/dL   HCT 52.0 39.0 - 52.0 %   Dg Chest 2 View  04/17/2015  CLINICAL DATA:  Weakness for 2 hours. History of stroke, hypertension, seizures, COPD. EXAM: CHEST  2 VIEW COMPARISON:  Chest radiograph February 08, 2015 FINDINGS: The cardiac silhouette is mildly enlarged and unchanged. Mediastinal silhouette is nonsuspicious, calcified aortic knob. Status post median sternotomy for CABG. No pleural effusion or focal consolidation. Similar strandy densities LEFT lung base. Similar pleural thickening in the costophrenic angles. No pneumothorax. Soft tissue planes and included osseous structure nonsuspicious.  Surgical clips project at [employer] junction. IMPRESSION: Stable cardiomegaly.  Stable LEFT lung base atelectasis/ scarring. Electronically Signed   By: Elon Alas M.D.   On: 04/17/2015 00:02   Dg Lumbar Spine Complete  04/17/2015  CLINICAL DATA:  64 year old male chest with stroke and weakness complaining of chronic back pain. EXAM: LUMBAR SPINE - COMPLETE 4+ VIEW COMPARISON:  Lumbar spine MRI dated 09/16/2013 FINDINGS: There is diffuse osteopenia. No definite acute fracture identified. There multilevel degenerative changes of the spine. Old appearing compression deformity of the lower thoracic spine. There is no acute subluxation. Left hip fixation screw is partially visualized. Multiple surgical clips noted over the mid abdomen. Vascular calcification noted  in the upper abdomen. There is constipation. IMPRESSION: Osteopenia with multilevel degenerative changes of spine. No definite acute fracture. Electronically Signed   By: Anner Crete M.D.   On: 04/17/2015 00:04   Ct Head Wo Contrast  04/16/2015  CLINICAL DATA:  Acute onset of bilateral weakness and tremors. EXAM: CT HEAD WITHOUT CONTRAST TECHNIQUE: Contiguous axial images were obtained from the base of the skull through the vertex without intravenous contrast. COMPARISON:  10/12/2014. FINDINGS: Stable remote left frontal infarct. The ventricles are in the midline without mass effect or shift. Cavum septum pellucidum again noted. No findings for acute hemispheric infarction or intracranial hemorrhage. No mass lesions. The brainstem and cerebellum are grossly normal. Chronic white matter changes versus small lacunar type infarcts in the brainstem. No significant bony findings. The paranasal sinuses and mastoid air cells are stable. Scattered mucous retention cysts or polyps in the ethmoid and maxillary sinuses. IMPRESSION: Remote left frontal infarction. No new/ acute intracranial findings Electronically Signed   By: Marijo Sanes M.D.   On:  04/16/2015 23:53   Mr Cervical Spine Wo Contrast  04/17/2015  CLINICAL DATA:  Worsening weakness beginning this evening. History of hypertension, seizures and stroke. EXAM: MRI CERVICAL SPINE WITHOUT CONTRAST TECHNIQUE: Multiplanar, multisequence MR imaging of the cervical spine was performed. No intravenous contrast was administered. COMPARISON:  CT angiogram of the head and neck Sep 22, 2013 FINDINGS: Cervical vertebral bodies and posterior elements are intact and aligned with maintenance of the cervical lordosis. Moderate C6-7 disc height loss, remaining cervical disc morphology is generally preserved. Decreased T2 signal within all cervical disc compatible with mild desiccation. Moderate chronic discogenic endplate changes D34-534, D34-534, mild at C5-6. No STIR signal abnormality to suggest acute osseous process. Cervical spinal cord is normal morphology and signal characteristics though, mild patient motion degrades sensitivity for potential subtle cord signal abnormality. Craniocervical junction intact. No MR findings of ligamentous injury. Included view of the brain demonstrates patchy pontine T2 hyperintensities compatible with infarcts. Included prevertebral and paraspinal soft tissues are nonsuspicious. Level by level evaluation: C2-3: Small broad-based central disc protrusion. Uncovertebral hypertrophy and mild facet arthropathy without canal stenosis. Mild LEFT neural foraminal narrowing. C3-4: Small broad-based disc bulge. Small LEFT subarticular disc protrusion. Uncovertebral hypertrophy and mild facet arthropathy on the LEFT. Mild canal stenosis. Mild RIGHT, moderate LEFT neural foraminal narrowing. C4-5: Small central disc protrusion and broad-based disc bulge. Uncovertebral hypertrophy. Moderate facet arthropathy. No canal stenosis. Moderate LEFT neural foraminal narrowing. C5-6: Small central disc protrusion and broad-based disc bulge. Uncovertebral hypertrophy and mild facet arthropathy without  canal stenosis or neural foraminal narrowing. C6-7: Small broad-based disc bulge. Uncovertebral hypertrophy and moderate facet arthropathy without canal stenosis. Moderate to severe bilateral neural foraminal narrowing. C7-T1: No disc bulge, canal stenosis nor neural foraminal narrowing. IMPRESSION: Degenerative cervical spine without acute fracture or malalignment. Mild canal stenosis C3-4. Multilevel neural foraminal narrowing: Moderate to severe bilaterally at C6-7. Electronically Signed   By: Elon Alas M.D.   On: 04/17/2015 01:13   wILL ADMIT TO MEDICINE  I personally performed the services described in this documentation, which was scribed in my presence. The recorded information has been reviewed and is accurate.      Veatrice Kells, MD 04/17/15 [phone]

## 2015-04-16 NOTE — ED Notes (Signed)
Pt brought to ED by [facility] for weakness, last time seen normal 2130, pt has hx of Stroke with bilateral weakness, muscle tremors. VS HR 50, BP 140/72, 96% RA, CBG 108.

## 2015-04-16 NOTE — Consult Note (Addendum)
Consult Reason for Consult:generalize weakness Referring Physician: Dr Randal Buba ED  CC: weakness and pain  HPI: Chris Payne is an 64 y.o. male hx of prior CVA, HTN, HLD, CAD, carotid stenosis presenting with generalized malaise, weakness which family states has gotten acute worse today. Unclear last seen well. Family reports patient woke up from a nap around 1930 stating he felt weak all over and had a generalized burning pain. Question of possible dysarthria at that time. EMS called. Upon ED arrival patients main concern was abdominal and back pain.  Per discussion with family, at baseline he requires assistance to walk and uses a walker. Has history of bilateral LE weakness, UE tremor and hyperreflexia with clonus.   CT head imaging reviewed and no acute process noted.   Past Medical History  Diagnosis Date  . Hypertension   . Stroke [facility])     h/o pontine CVA  . AAA (abdominal aortic aneurysm) ([place])     s/p repair 6/11  . Hyperlipidemia   . CAD (coronary artery disease)     s/p CABG 2/12:  LIMA to LAD, SVG to diagonal-1, SVG to ramus intermedius,, SVG to AM (Dr. Roxan Hockey) ;  b.  Myoview 10/13:  inf infarct with very mild peri-infarct ischemia, EF 49%, inf HK; c  He has severe three-vessel native coronary artery disease. Catheterization March 2015 as above  . Carotid stenosis     a.  dopplers 0000000: LICA 0000000;  b. Carotid dopplers 3/14:  R 0-39%, L 60-79%, repeat 6 mos  . PAD (peripheral artery disease) ([place])     ABIs 4/12:  R 0.88, L 0.92; R SFA 40%, L CFA 50%, L SFA 50-60%  . COPD (chronic obstructive pulmonary disease) ([place])   . Inguinal hernia   . Thyroid nodule     incidental finding on carotid doppler 3/14 => thyroid U/S ordered  . Pneumonia     hx  . GERD (gastroesophageal reflux disease)   . Myocardial infarction [facility])     PCI x3  . [id number])     Past Surgical History  Procedure Laterality Date  . Hip surgery  40 years ago    left hip bone removal and  pinning  . Abdominal aortic aneurysm repair  10-03-2009  . Exploratory laparotomy  09/2009    ligation of lumbar arteries  . Coronary artery bypass graft  06/24/2010    LIMA to the LAD, SVG to first diagonal, SVG to ramus intermediate, SVG to acute marginal. EF 50%. 2/12  . External ear surgery Left     laceration child  . Inguinal hernia repair Left 07/21/2012    Procedure: HERNIA REPAIR INGUINAL ADULT;  Surgeon: Merrie Roof, MD;  Location: [place];  Service: General;  Laterality: Left;  . Insertion of mesh Left 07/21/2012    Procedure: INSERTION OF MESH;  Surgeon: Merrie Roof, MD;  Location: [place];  Service: General;  Laterality: Left;  . Exploration post operative open heart    . Nm myocar perf wall motion  12/02/2010    inferoapical defect is fixed c/w prior infarction/scar, there is minimal borderzone ischemia.  . Left heart catheterization with coronary/graft angiogram  06/21/2012    Procedure: LEFT HEART CATHETERIZATION WITH Beatrix Fetters;  Surgeon: Minus Breeding, MD;  Location: [facility] CATH LAB;  Service: Cardiovascular;;  . Left heart catheterization with coronary/graft angiogram N/A 07/04/2013    Procedure: LEFT HEART CATHETERIZATION WITH Beatrix Fetters;  Surgeon: Sinclair Grooms, MD;  Location:  [place] CATH LAB;  Service: Cardiovascular;  Laterality: N/A;    Family History  Problem Relation Age of Onset  . Alcohol abuse Father   . Cancer Sister     Social History:  reports that he quit smoking about 5 years ago. His smoking use included Cigarettes. He has a 30 pack-year smoking history. He has never used smokeless tobacco. He reports that he does not drink alcohol or use illicit drugs.  No Known Allergies   ROS: Out of a complete 14 system review, the patient complains of only the following symptoms, and all other reviewed systems are negative. +weakness, pain  Physical Examination: Filed Vitals:   04/16/15 2254  BP: 152/76  Pulse: 51  Temp: 97.4 F (36.3  C)   Physical Exam  Constitutional: He appears well-developed and well-nourished.  Psych: Affect appropriate to situation Eyes: No scleral injection HENT: No OP obstrucion Head: Normocephalic.  Cardiovascular: Normal rate and regular rhythm.  Respiratory: Effort normal and breath sounds normal.  GI: Soft. Bowel sounds are normal. No distension. There is no tenderness.  Skin: WDI  Neurologic Examination Mental Status: Alert, oriented to name, location, slight difficulty with date.  Speech fluent without evidence of aphasia. Mild dysarthria. Able to follow multi-step commands without difficulty. Cranial Nerves: II: optic discs difficult to visualize, visual fields grossly normal, pupils equal, round, reactive to light III,IV, VI: ptosis not present, extra-ocular motions intact bilaterally V,VII: smile symmetric, facial light touch sensation normal bilaterally VIII: hearing normal bilaterally IX,X: gag reflex present XI: trapezius strength/neck flexion strength normal bilaterally XII: tongue strength normal  Motor: Proximal RUE 5-/5, distal 5/5 Proximal LUE 4/5, distal 5-/5 Proximal bilateral LE 3/5, distal 4+/5 Sensory: Pinprick and light touch intact throughout, bilaterally. No spinal level noted Deep Tendon Reflexes: 3+ and symmetric throughout bilateral UE with spread, unsustained clonus at patellar bilateral, sustained clonus bilateral achilles Cerebellar: Mild ataxia with FTN bilateral, unable to test HTS due to weakness Gait: deferred due to fall risk  Laboratory Studies:   Basic Metabolic Panel:  Recent Labs Lab 04/16/15 2322  NA 141  K 4.4  CL 103  GLUCOSE 120*  BUN 15  CREATININE 0.70    Liver Function Tests: No results for input(s): AST, ALT, ALKPHOS, BILITOT, PROT, ALBUMIN in the last 168 hours. No results for input(s): LIPASE, AMYLASE in the last 168 hours. No results for input(s): AMMONIA in the last 168 hours.  CBC:  Recent Labs Lab  04/16/15 2322  HGB 17.7*  HCT 52.0    Cardiac Enzymes: No results for input(s): CKTOTAL, CKMB, CKMBINDEX, TROPONINI in the last 168 hours.  BNP: Invalid input(s): POCBNP  CBG: No results for input(s): GLUCAP in the last 168 hours.  Microbiology: Results for orders placed or performed during the hospital encounter of 02/08/15  Urine culture     Status: None   Collection Time: 02/08/15  7:02 PM  Result Value Ref Range Status   Specimen Description URINE, CLEAN CATCH  Final   Special Requests NONE  Final   Culture >=100,000 COLONIES/mL ENTEROCOCCUS SPECIES  Final   Report Status 02/11/2015 FINAL  Final   Organism ID, Bacteria ENTEROCOCCUS SPECIES  Final      Susceptibility   Enterococcus species - MIC*    AMPICILLIN <=2 SENSITIVE Sensitive     LEVOFLOXACIN >=8 RESISTANT Resistant     NITROFURANTOIN <=16 SENSITIVE Sensitive     VANCOMYCIN 1 SENSITIVE Sensitive     * >=100,000 COLONIES/mL ENTEROCOCCUS SPECIES    Coagulation  Studies: No results for input(s): LABPROT, INR in the last 72 hours.  Urinalysis: No results for input(s): COLORURINE, LABSPEC, PHURINE, GLUCOSEU, HGBUR, BILIRUBINUR, KETONESUR, PROTEINUR, UROBILINOGEN, NITRITE, LEUKOCYTESUR in the last 168 hours.  Invalid input(s): APPERANCEUR  Lipid Panel:     Component Value Date/Time   CHOL 253* 06/15/2014 1057   TRIG 120 06/15/2014 1057   HDL 56 06/15/2014 1057   CHOLHDL 4.5 06/15/2014 1057   VLDL 24 06/15/2014 1057   LDLCALC 173* 06/15/2014 1057    HgbA1C:  Lab Results  Component Value Date   HGBA1C 5.1 11/23/2013    Urine Drug Screen:     Component Value Date/Time   LABOPIA POSITIVE* 09/22/2013 0915   COCAINSCRNUR NONE DETECTED 09/22/2013 0915   LABBENZ NONE DETECTED 09/22/2013 0915   AMPHETMU NONE DETECTED 09/22/2013 0915   THCU NONE DETECTED 09/22/2013 0915   LABBARB NONE DETECTED 09/22/2013 0915    Alcohol Level: No results for input(s): ETH in the last 168 hours.  Other  results:  Imaging: No results found.   Assessment/Plan:  64y/o gentleman with history of prior CVA, chronic leg weakness presenting with generalized weakness and pain. Exam pertinent for generalized weakness LE>UE with marked hyperreflexia throughout. From looking back at older notes this appears chronic. With reported worsening and exam findings will need to rule out cervical cord disease. Due to non-focal exam low suspicion for CVA. Infectious or polypharmacy may also be playing a role.   -MRI C spine. If stable no further inpatient neurological workup indicated at this time. Can follow up with outpatient neurology -check B12, UA, CMP -would benefit from rehab evaluation  Jim Like, DO [facility] [phone]  If 7pm- 7am, please page neurology on call as listed in [place]. 04/16/2015, 11:28 PM

## 2015-04-16 NOTE — ED Notes (Signed)
Code Stroke cancel spoke to [place] @ 2317.

## 2015-04-17 ENCOUNTER — Encounter (HOSPITAL_COMMUNITY): Payer: Self-pay | Admitting: Emergency Medicine

## 2015-04-17 DIAGNOSIS — Z8673 Personal history of transient ischemic attack (TIA), and cerebral infarction without residual deficits: Secondary | ICD-10-CM

## 2015-04-17 DIAGNOSIS — R531 Weakness: Secondary | ICD-10-CM

## 2015-04-17 DIAGNOSIS — D751 Secondary polycythemia: Secondary | ICD-10-CM | POA: Diagnosis present

## 2015-04-17 DIAGNOSIS — D45 Polycythemia vera: Secondary | ICD-10-CM

## 2015-04-17 DIAGNOSIS — J438 Other emphysema: Secondary | ICD-10-CM | POA: Diagnosis not present

## 2015-04-17 DIAGNOSIS — Z8679 Personal history of other diseases of the circulatory system: Secondary | ICD-10-CM

## 2015-04-17 DIAGNOSIS — R5383 Other fatigue: Secondary | ICD-10-CM

## 2015-04-17 DIAGNOSIS — R5381 Other malaise: Secondary | ICD-10-CM

## 2015-04-17 DIAGNOSIS — M50221 Other cervical disc displacement at C4-C5 level: Secondary | ICD-10-CM | POA: Diagnosis not present

## 2015-04-17 DIAGNOSIS — E785 Hyperlipidemia, unspecified: Secondary | ICD-10-CM | POA: Diagnosis not present

## 2015-04-17 DIAGNOSIS — I739 Peripheral vascular disease, unspecified: Secondary | ICD-10-CM

## 2015-04-17 DIAGNOSIS — N400 Enlarged prostate without lower urinary tract symptoms: Secondary | ICD-10-CM | POA: Diagnosis present

## 2015-04-17 LAB — ETHANOL

## 2015-04-17 LAB — URINALYSIS, ROUTINE W REFLEX MICROSCOPIC
GLUCOSE, UA: NEGATIVE mg/dL
HGB URINE DIPSTICK: NEGATIVE
Ketones, ur: NEGATIVE mg/dL
Leukocytes, UA: NEGATIVE
Nitrite: NEGATIVE
PH: 6.5 (ref 5.0–8.0)
Protein, ur: NEGATIVE mg/dL
SPECIFIC GRAVITY, URINE: 1.018 (ref 1.005–1.030)

## 2015-04-17 LAB — CBG MONITORING, ED: GLUCOSE-CAPILLARY: 116 mg/dL — AB (ref 65–99)

## 2015-04-17 LAB — VITAMIN B12: Vitamin B-12: 667 pg/mL (ref 180–914)

## 2015-04-17 LAB — C-REACTIVE PROTEIN: CRP: <0.5 mg/dL (ref <1.0)

## 2015-04-17 LAB — SEDIMENTATION RATE: Sed Rate: 2 mm/hr (ref 0–16)

## 2015-04-17 LAB — FOLATE: Folate: 12.4 ng/mL (ref >5.9)

## 2015-04-17 LAB — MAGNESIUM: Magnesium: 2 mg/dL (ref 1.7–2.4)

## 2015-04-17 MED ORDER — ALBUTEROL SULFATE (2.5 MG/3ML) 0.083% IN NEBU: 2.5000 mg | INHALATION_SOLUTION | RESPIRATORY_TRACT | Status: DC | PRN

## 2015-04-17 MED ORDER — VITAMIN B-12 100 MCG PO TABS
100.0000 ug | ORAL_TABLET | Freq: Every day | ORAL | Status: DC
  Administered 2015-04-17: 100 ug via ORAL
  Filled 2015-04-17: qty 1

## 2015-04-17 MED ORDER — ACETAMINOPHEN 325 MG PO TABS: 650.0000 mg | ORAL_TABLET | Freq: Four times a day (QID) | ORAL | Status: DC | PRN

## 2015-04-17 MED ORDER — NITROGLYCERIN 0.4 MG SL SUBL: 0.4000 mg | SUBLINGUAL_TABLET | SUBLINGUAL | Status: DC | PRN

## 2015-04-17 MED ORDER — GABAPENTIN 100 MG PO CAPS: 200.0000 mg | ORAL_CAPSULE | Freq: Every day | ORAL | Status: DC

## 2015-04-17 MED ORDER — ROSUVASTATIN CALCIUM 40 MG PO TABS
40.0000 mg | ORAL_TABLET | Freq: Every day | ORAL | Status: DC
  Administered 2015-04-17: 40 mg via ORAL
  Filled 2015-04-17: qty 1

## 2015-04-17 MED ORDER — HYDROCODONE-ACETAMINOPHEN 10-325 MG PO TABS: 1.0000 | ORAL_TABLET | ORAL | Status: DC | PRN

## 2015-04-17 MED ORDER — B COMPLEX-VITAMIN B12 PO TABS: 1.0000 | ORAL_TABLET | Freq: Every day | ORAL | Status: DC

## 2015-04-17 MED ORDER — MORPHINE SULFATE ER 30 MG PO TBCR
30.0000 mg | EXTENDED_RELEASE_TABLET | Freq: Two times a day (BID) | ORAL | Status: DC
  Administered 2015-04-17: 30 mg via ORAL
  Filled 2015-04-17: qty 1

## 2015-04-17 MED ORDER — B COMPLEX-C PO TABS
1.0000 | ORAL_TABLET | Freq: Every day | ORAL | Status: DC
  Administered 2015-04-17: 1 via ORAL
  Filled 2015-04-17: qty 1

## 2015-04-17 MED ORDER — CARVEDILOL 3.125 MG PO TABS: 3.1250 mg | ORAL_TABLET | Freq: Every day | ORAL | Status: DC

## 2015-04-17 MED ORDER — CLOPIDOGREL BISULFATE 75 MG PO TABS
75.0000 mg | ORAL_TABLET | Freq: Every day | ORAL | Status: DC
  Administered 2015-04-17: 75 mg via ORAL
  Filled 2015-04-17: qty 1

## 2015-04-17 MED ORDER — ASPIRIN EC 81 MG PO TBEC
81.0000 mg | DELAYED_RELEASE_TABLET | Freq: Every day | ORAL | Status: DC
  Administered 2015-04-17: 81 mg via ORAL
  Filled 2015-04-17: qty 1

## 2015-04-17 MED ORDER — METOPROLOL TARTRATE 12.5 MG HALF TABLET
12.5000 mg | ORAL_TABLET | Freq: Two times a day (BID) | ORAL | Status: DC
  Administered 2015-04-17: 12.5 mg via ORAL
  Filled 2015-04-17: qty 1

## 2015-04-17 MED ORDER — FUROSEMIDE 20 MG PO TABS
20.0000 mg | ORAL_TABLET | Freq: Every day | ORAL | Status: DC
  Administered 2015-04-17: 20 mg via ORAL
  Filled 2015-04-17: qty 1

## 2015-04-17 MED ORDER — ONDANSETRON HCL 4 MG PO TABS
4.0000 mg | ORAL_TABLET | Freq: Four times a day (QID) | ORAL | Status: DC | PRN
Start: 2015-04-17 — End: 2015-04-17

## 2015-04-17 MED ORDER — MORPHINE SULFATE ER 30 MG PO TBCR: 30.0000 mg | EXTENDED_RELEASE_TABLET | Freq: Two times a day (BID) | ORAL | Status: DC

## 2015-04-17 MED ORDER — SODIUM CHLORIDE 0.9 % IV SOLN
1.0000 g | Freq: Once | INTRAVENOUS | Status: AC
  Administered 2015-04-17: 1 g via INTRAVENOUS
  Filled 2015-04-17: qty 10

## 2015-04-17 MED ORDER — ONDANSETRON HCL 4 MG/2ML IJ SOLN: 4.0000 mg | Freq: Four times a day (QID) | INTRAMUSCULAR | Status: DC | PRN

## 2015-04-17 MED ORDER — ISOSORBIDE MONONITRATE ER 30 MG PO TB24
120.0000 mg | ORAL_TABLET | Freq: Every day | ORAL | Status: DC
  Administered 2015-04-17: 120 mg via ORAL
  Filled 2015-04-17: qty 4

## 2015-04-17 MED ORDER — GABAPENTIN 100 MG PO CAPS
100.0000 mg | ORAL_CAPSULE | Freq: Every day | ORAL | Status: DC
  Administered 2015-04-17: 100 mg via ORAL
  Filled 2015-04-17: qty 1

## 2015-04-17 MED ORDER — LEVETIRACETAM 500 MG PO TABS
500.0000 mg | ORAL_TABLET | Freq: Two times a day (BID) | ORAL | Status: DC
  Administered 2015-04-17: 500 mg via ORAL
  Filled 2015-04-17: qty 1

## 2015-04-17 MED ORDER — ENOXAPARIN SODIUM 30 MG/0.3ML ~~LOC~~ SOLN
30.0000 mg | SUBCUTANEOUS | Status: DC
  Administered 2015-04-17: 30 mg via SUBCUTANEOUS
  Filled 2015-04-17: qty 0.3

## 2015-04-17 MED ORDER — RANOLAZINE ER 500 MG PO TB12
1000.0000 mg | ORAL_TABLET | Freq: Two times a day (BID) | ORAL | Status: DC
  Administered 2015-04-17: 1000 mg via ORAL
  Filled 2015-04-17: qty 2

## 2015-04-17 MED ORDER — ACETAMINOPHEN 650 MG RE SUPP: 650.0000 mg | Freq: Four times a day (QID) | RECTAL | Status: DC | PRN

## 2015-04-17 MED ORDER — TAMSULOSIN HCL 0.4 MG PO CAPS: 0.4000 mg | ORAL_CAPSULE | Freq: Every day | ORAL | Status: DC

## 2015-04-17 NOTE — Progress Notes (Signed)
Patient d/c home. D/c instructions given and patient verbalized understanding. 

## 2015-04-17 NOTE — ED Notes (Signed)
Admitting MD at the bedside.  

## 2015-04-17 NOTE — H&P (Addendum)
[facility] History and Physical  Chris Payne:[id number] DOB: Jul 24, 1950 DOA: 04/16/2015  Referring physician: ED PCP: Reginia Forts, MD   Chief Complaint:   weakness   HPI:  Chris Payne is a 64 year old male with a extensive past medical history of AAA s/p repair, peripheral artery disease, HLD, CAD, HTN, carotid artery stenosis, COPD,  and seizures;  Who presents with complaints of acute onset of weakness.  Reported to be last seen normal prior to around 9:30 PM when he woke up  From a nap and noticed the symptoms. Reports weakness  All over his whole body with associated symptoms of feeling very hot all over as though he was burning up and subsequently noting shaking all over. Patient denies any loss of consciousness or recent seizures. Denies any drinking or tobacco use since 2011/2012.  He notes that he has a history of chronic back pain issues for which he is on multiple pain medications and CC management. He notes that he has an appointment to go to [facility] in [place] on April 29 2016 to receive injections in his back.  Denies having any chest pain, shortness of breath, inability to feel the need to urinate, or any recent falls.  At baseline the patient ambulates with a  Walker. Patient reports his last fall was over 3 months ago.   Upon arrival to the emergency department a code stroke was called. Initial CT scan of the brain was negative neurology was consulted a evaluated patient and recommended a MRI of the spine.   Review of Systems  Constitutional: Negative for chills.  HENT: Positive for tinnitus.   Eyes: Negative for pain and discharge.  Respiratory: Positive for shortness of breath. Negative for cough.   Cardiovascular: Positive for leg swelling. Negative for orthopnea.  Gastrointestinal: Negative for abdominal pain and diarrhea.  Genitourinary: Negative for frequency and hematuria.  Musculoskeletal: Positive for back pain and joint pain. Negative for falls.   Skin: Negative for itching and rash.  Neurological: Positive for tremors and weakness. Negative for speech change, focal weakness, seizures, loss of consciousness and headaches.  Endo/Heme/Allergies: Negative for environmental allergies.  Psychiatric/Behavioral: Negative for suicidal ideas and substance abuse.      Past Medical History  Diagnosis Date  . Hypertension   . Stroke [facility])     h/o pontine CVA  . AAA (abdominal aortic aneurysm) ([place])     s/p repair 6/11  . Hyperlipidemia   . CAD (coronary artery disease)     s/p CABG 2/12:  LIMA to LAD, SVG to diagonal-1, SVG to ramus intermedius,, SVG to AM (Dr. Roxan Hockey) ;  b.  Myoview 10/13:  inf infarct with very mild peri-infarct ischemia, EF 49%, inf HK; c  He has severe three-vessel native coronary artery disease. Catheterization March 2015 as above  . Carotid stenosis     a.  dopplers 0/35: LICA 46-56%;  b. Carotid dopplers 3/14:  R 0-39%, L 60-79%, repeat 6 mos  . PAD (peripheral artery disease) ([place])     ABIs 4/12:  R 0.88, L 0.92; R SFA 40%, L CFA 50%, L SFA 50-60%  . COPD (chronic obstructive pulmonary disease) ([place])   . Inguinal hernia   . Thyroid nodule     incidental finding on carotid doppler 3/14 => thyroid U/S ordered  . Pneumonia     hx  . GERD (gastroesophageal reflux disease)   . Myocardial infarction [facility])     PCI x3  . Headache(784.0)  Past Surgical History  Procedure Laterality Date  . Hip surgery  40 years ago    left hip bone removal and pinning  . Abdominal aortic aneurysm repair  10-03-2009  . Exploratory laparotomy  09/2009    ligation of lumbar arteries  . Coronary artery bypass graft  06/24/2010    LIMA to the LAD, SVG to first diagonal, SVG to ramus intermediate, SVG to acute marginal. EF 50%. 2/12  . External ear surgery Left     laceration child  . Inguinal hernia repair Left 07/21/2012    Procedure: HERNIA REPAIR INGUINAL ADULT;  Surgeon: Merrie Roof, MD;  Location: [place];  Service:  General;  Laterality: Left;  . Insertion of mesh Left 07/21/2012    Procedure: INSERTION OF MESH;  Surgeon: Merrie Roof, MD;  Location: [place];  Service: General;  Laterality: Left;  . Exploration post operative open heart    . Nm myocar perf wall motion  12/02/2010    inferoapical defect is fixed c/w prior infarction/scar, there is minimal borderzone ischemia.  . Left heart catheterization with coronary/graft angiogram  06/21/2012    Procedure: LEFT HEART CATHETERIZATION WITH Beatrix Fetters;  Surgeon: Minus Breeding, MD;  Location: [facility] CATH LAB;  Service: Cardiovascular;;  . Left heart catheterization with coronary/graft angiogram N/A 07/04/2013    Procedure: LEFT HEART CATHETERIZATION WITH Beatrix Fetters;  Surgeon: Sinclair Grooms, MD;  Location: [facility] CATH LAB;  Service: Cardiovascular;  Laterality: N/A;      Social History:  reports that he quit smoking about 5 years ago. His smoking use included Cigarettes. He has a 30 pack-year smoking history. He has never used smokeless tobacco. He reports that he does not drink alcohol or use illicit drugs. Where does patient live--home  and with whom if at home? Wife and her son and family Can patient participate in ADLs?Yes  No Known Allergies  Family History  Problem Relation Age of Onset  . Alcohol abuse Father   . Cancer Sister         Prior to Admission medications   Medication Sig Start Date End Date Taking? Authorizing Provider  aspirin EC 81 MG tablet Take 81 mg by mouth every morning.     Historical Provider, MD  B Complex-Folic Acid (B COMPLEX-VITAMIN B12) TABS Take 1 tablet by mouth daily. 11/26/13   Barton Fanny, MD  carvedilol (COREG) 3.125 MG tablet Take 1 tablet (3.125 mg total) by mouth daily with breakfast. 10/26/13   Minus Breeding, MD  ciprofloxacin (CIPRO) 500 MG tablet Take 1 tablet (500 mg total) by mouth 2 (two) times daily. 02/08/15   Antony Blackbird, MD  clopidogrel (PLAVIX) 75 MG tablet Take 1  tablet (75 mg total) by mouth daily with breakfast. 09/23/13   Bernadene Bell, MD  furosemide (LASIX) 20 MG tablet Take 1 tablet (20 mg total) by mouth daily. 09/20/14   Nicole Pisciotta, PA-C  gabapentin (NEURONTIN) 100 MG capsule TAKE 1 CAPSULE BY MOUTH EACH MORNING AND2 CAPSULES AT BEDTIME AS DIRECTED 02/21/14   Barton Fanny, MD  HYDROcodone-acetaminophen [facility]) 10-325 MG per tablet Take 2 tablets by mouth every 4 (four) hours as needed for moderate pain.     Historical Provider, MD  isosorbide mononitrate (IMDUR) 120 MG 24 hr tablet Take 1 tablet (120 mg total) by mouth daily. 07/27/14   Minus Breeding, MD  levETIRAcetam (KEPPRA) 500 MG tablet Take 1 tablet (500 mg total) by mouth 2 (two) times daily.  03/22/15   Wardell Honour, MD  metoprolol tartrate (LOPRESSOR) 25 MG tablet Take 0.5 tablets (12.5 mg total) by mouth 2 (two) times daily. 08/10/14   Minus Breeding, MD  morphine (MS CONTIN) 30 MG 12 hr tablet Take 30 mg by mouth every 12 (twelve) hours.  07/07/13   Historical Provider, MD  nitroGLYCERIN (NITROSTAT) 0.4 MG SL tablet Place 0.4 mg under the tongue every 5 (five) minutes as needed for chest pain.  06/01/13   Minus Breeding, MD  ranolazine (RANEXA) 1000 MG SR tablet Take 1 tablet (1,000 mg total) by mouth 2 (two) times daily. 08/06/14   Minus Breeding, MD  rosuvastatin (CRESTOR) 40 MG tablet Take 1 tablet (40 mg total) by mouth daily. 02/15/15   Minus Breeding, MD  tamsulosin (FLOMAX) 0.4 MG CAPS capsule Take 0.4 mg by mouth at bedtime.     Historical Provider, MD     Physical Exam: Filed Vitals:   04/17/15 0130 04/17/15 0200 04/17/15 0322 04/17/15 0406  BP: 121/70 122/70 141/85   Pulse: 55 53 54   Temp:   98.6 F (37 C)   TempSrc:   Oral   Resp: _0 Height:      Weight:    71.8 kg (158 lb 4.6 oz)  SpO2: 96% 98% 96%      Constitutional: Vital signs reviewed. Patient is a well-developed and well-nourished in no acute distress and cooperative with exam. Alert and  oriented x3.  Head: Normocephalic and atraumatic  Ear: TM normal bilaterally  Mouth: no erythema or exudates, MMM  Eyes: PERRL, EOMI, conjunctivae normal, No scleral icterus.  Neck: Supple, Trachea midline normal ROM, No JVD, mass, thyromegaly, or carotid bruit present.  Cardiovascular: RRR, S1 normal, S2 normal, no MRG, pulses symmetric and intact bilaterally  Pulmonary/Chest: CTAB, no wheezes, rales, or rhonchi  Abdominal: Soft. Non-tender, non-distended, bowel sounds are normal, no masses, organomegaly, or guarding present.  GU: no CVA tenderness Musculoskeletal: No joint deformities, erythema, or stiffness,  Decreased range of motion of the spine  with significant tenderness to palpation of the spine. Ext: no edema and no cyanosis, pulses palpable bilaterally (DP and PT)  Hematology: no cervical, inginal, or axillary adenopathy.  Neurological: A&O x3,  Patient has a upper extremity tremor  with symmetric bilateral weakness 4 out of 5 of the lower extremities. Skin: Warm, dry and intact. No rash, cyanosis, or clubbing.  Psychiatric: Normal mood and affect. speech and behavior is normal. Judgment and thought content normal. Cognition and memory are normal.      Data Review   Micro Results No results found for this or any previous visit (from the past 240 hour(s)).  Radiology Reports Dg Chest 2 View  04/17/2015  CLINICAL DATA:  Weakness for 2 hours. History of stroke, hypertension, seizures, COPD. EXAM: CHEST  2 VIEW COMPARISON:  Chest radiograph February 08, 2015 FINDINGS: The cardiac silhouette is mildly enlarged and unchanged. Mediastinal silhouette is nonsuspicious, calcified aortic knob. Status post median sternotomy for CABG. No pleural effusion or focal consolidation. Similar strandy densities LEFT lung base. Similar pleural thickening in the costophrenic angles. No pneumothorax. Soft tissue planes and included osseous structure nonsuspicious. Surgical clips project at [employer] junction.  IMPRESSION: Stable cardiomegaly.  Stable LEFT lung base atelectasis/ scarring. Electronically Signed   By: Elon Alas M.D.   On: 04/17/2015 00:02   Dg Lumbar Spine Complete  04/17/2015  CLINICAL DATA:  64 year old male chest with stroke and weakness complaining of chronic  back pain. EXAM: LUMBAR SPINE - COMPLETE 4+ VIEW COMPARISON:  Lumbar spine MRI dated 09/16/2013 FINDINGS: There is diffuse osteopenia. No definite acute fracture identified. There multilevel degenerative changes of the spine. Old appearing compression deformity of the lower thoracic spine. There is no acute subluxation. Left hip fixation screw is partially visualized. Multiple surgical clips noted over the mid abdomen. Vascular calcification noted in the upper abdomen. There is constipation. IMPRESSION: Osteopenia with multilevel degenerative changes of spine. No definite acute fracture. Electronically Signed   By: Anner Crete M.D.   On: 04/17/2015 00:04   Ct Head Wo Contrast  04/16/2015  CLINICAL DATA:  Acute onset of bilateral weakness and tremors. EXAM: CT HEAD WITHOUT CONTRAST TECHNIQUE: Contiguous axial images were obtained from the base of the skull through the vertex without intravenous contrast. COMPARISON:  10/12/2014. FINDINGS: Stable remote left frontal infarct. The ventricles are in the midline without mass effect or shift. Cavum septum pellucidum again noted. No findings for acute hemispheric infarction or intracranial hemorrhage. No mass lesions. The brainstem and cerebellum are grossly normal. Chronic white matter changes versus small lacunar type infarcts in the brainstem. No significant bony findings. The paranasal sinuses and mastoid air cells are stable. Scattered mucous retention cysts or polyps in the ethmoid and maxillary sinuses. IMPRESSION: Remote left frontal infarction. No new/ acute intracranial findings Electronically Signed   By: Marijo Sanes M.D.   On: 04/16/2015 23:53   Mr Cervical Spine Wo  Contrast  04/17/2015  CLINICAL DATA:  Worsening weakness beginning this evening. History of hypertension, seizures and stroke. EXAM: MRI CERVICAL SPINE WITHOUT CONTRAST TECHNIQUE: Multiplanar, multisequence MR imaging of the cervical spine was performed. No intravenous contrast was administered. COMPARISON:  CT angiogram of the head and neck Sep 22, 2013 FINDINGS: Cervical vertebral bodies and posterior elements are intact and aligned with maintenance of the cervical lordosis. Moderate C6-7 disc height loss, remaining cervical disc morphology is generally preserved. Decreased T2 signal within all cervical disc compatible with mild desiccation. Moderate chronic discogenic endplate changes X7-9, T9-0, mild at C5-6. No STIR signal abnormality to suggest acute osseous process. Cervical spinal cord is normal morphology and signal characteristics though, mild patient motion degrades sensitivity for potential subtle cord signal abnormality. Craniocervical junction intact. No MR findings of ligamentous injury. Included view of the brain demonstrates patchy pontine T2 hyperintensities compatible with infarcts. Included prevertebral and paraspinal soft tissues are nonsuspicious. Level by level evaluation: C2-3: Small broad-based central disc protrusion. Uncovertebral hypertrophy and mild facet arthropathy without canal stenosis. Mild LEFT neural foraminal narrowing. C3-4: Small broad-based disc bulge. Small LEFT subarticular disc protrusion. Uncovertebral hypertrophy and mild facet arthropathy on the LEFT. Mild canal stenosis. Mild RIGHT, moderate LEFT neural foraminal narrowing. C4-5: Small central disc protrusion and broad-based disc bulge. Uncovertebral hypertrophy. Moderate facet arthropathy. No canal stenosis. Moderate LEFT neural foraminal narrowing. C5-6: Small central disc protrusion and broad-based disc bulge. Uncovertebral hypertrophy and mild facet arthropathy without canal stenosis or neural foraminal narrowing.  C6-7: Small broad-based disc bulge. Uncovertebral hypertrophy and moderate facet arthropathy without canal stenosis. Moderate to severe bilateral neural foraminal narrowing. C7-T1: No disc bulge, canal stenosis nor neural foraminal narrowing. IMPRESSION: Degenerative cervical spine without acute fracture or malalignment. Mild canal stenosis C3-4. Multilevel neural foraminal narrowing: Moderate to severe bilaterally at C6-7. Electronically Signed   By: Elon Alas M.D.   On: 04/17/2015 01:13     CBC  Recent Labs Lab 04/16/15 2318 04/16/15 2322  WBC 6.5  --  HGB 16.7 17.7*  HCT 50.7 52.0  PLT 163  --   MCV 105.4*  --   MCH 34.7*  --   MCHC 32.9  --   RDW 12.9  --   LYMPHSABS 2.2  --   MONOABS 0.7  --   EOSABS 0.1  --   BASOSABS 0.0  --     Chemistries   Recent Labs Lab 04/16/15 2318 04/16/15 2322  NA 141 141  K 4.5 4.4  CL 106 103  CO2 25  --   GLUCOSE 130* 120*  BUN 12 15  CREATININE 0.79 0.70  CALCIUM 9.1  --   AST 19  --   ALT 19  --   ALKPHOS 60  --   BILITOT 0.9  --    ------------------------------------------------------------------------------------------------------------------ estimated creatinine clearance is 86.1 mL/min (by C-G formula based on Cr of 0.7). ------------------------------------------------------------------------------------------------------------------ No results for input(s): HGBA1C in the last 72 hours. ------------------------------------------------------------------------------------------------------------------ No results for input(s): CHOL, HDL, LDLCALC, TRIG, CHOLHDL, LDLDIRECT in the last 72 hours. ------------------------------------------------------------------------------------------------------------------ No results for input(s): TSH, T4TOTAL, T3FREE, THYROIDAB in the last 72 hours.  Invalid input(s):  FREET3 ------------------------------------------------------------------------------------------------------------------ No results for input(s): VITAMINB12, FOLATE, FERRITIN, TIBC, IRON, RETICCTPCT in the last 72 hours.  Coagulation profile  Recent Labs Lab 04/16/15 2318  INR 1.07    No results for input(s): DDIMER in the last 72 hours.  Cardiac Enzymes No results for input(s): CKMB, TROPONINI, MYOGLOBIN in the last 168 hours.  Invalid input(s): CK ------------------------------------------------------------------------------------------------------------------ Invalid input(s): POCBNP   CBG:  Recent Labs Lab 04/17/15 0054  GLUCAP 116*       EKG: pending  Assessment/Plan Principal Problem:    Generalized weakness:  Acute. Patient with reports of progressively worsening lower extremity weakness as well as generalized upper extremity tremor. Patient seen to have elevated MCV and MCH which would give rise to the possibility of vitamin B-12 deficiency which could be precipitating patient's symptoms.  Patient with multiple different possible causes for symptoms including neuro versus vitamin deficiency versus his back. Neurology recommended MRI of the spine. - Admit to a telemetry bed - Neuro checks per stroke protocol - checking esr, crp for possible infectious cause - Checking B12 , methylmalonic acid, folate, magnesium - Physical therapy to eval and treat - consult social work/ case management    Polycythemia: Patient's hemoglobin and hematocrit appear to have been trending upward over the last 2 years. Hemoglobin elevated at 17.7  and considering patient's complaints of feeling hot all over to be possibly related.  This could be secondary as patient has a history of COPD. -  May benefit from a hematology consult ?  If this is related to the presenting symptoms.   Chronic back pain:  Patient reports follow-up to have injections of spine in January with [facility]  [place].  MRI revealed moderate to  severe spinal stenosis of the  C5-6 - continue home pain regimen   -  Would obtain orthopedic thoughts on recent MRI.  Patient likely a poor surgical candidate given history  Hypocalcemia:  Acute.  Ionized calcium seen to be low at 1.12 - replaced with 1g of calcium gluconate - Continue to monitor and replace as needed HTN: stable - Continue coreg, lasix, metoprolol  CAD - continue ASA, isosorbide mononitrate, Plavix - checking 12 lead ekg  Hyperlipidemia - Continue Crestor  COPD (chronic obstructive pulmonary disease):  Per patient not on any inhalers at home denies any shortness of breath currently.  Seizure do:  Patient denies any recent history of seizure - continue keprra   S/P AAA repair: stable     History of pontine CVA  BPH -continue flomax  Code Status:   full Family Communication: bedside Disposition Plan: admit   Total time spent 55 minutes.Greater than 50% of this time was spent in counseling, explanation of diagnosis, planning of further management, and coordination of care  [facility] Pager [phone]  If 7PM-7AM, please contact night-coverage [email or web address] Password [email or web address] 04/17/2015, 4:07 AM

## 2015-04-17 NOTE — Progress Notes (Signed)
Pt arrived to 5M10 from ED. Pt assisted x2 to bed. Pt alert and oriented x 4. Vitals taken and stable. Teley box applied. Pt resting comfortably.

## 2015-04-17 NOTE — Progress Notes (Deleted)
Patient Demographics  Chris Payne, is a 64 y.o. male, DOB - 02-22-1951, [id number]  Admit date - 04/16/2015   Admitting Physician Norval Morton, MD  Outpatient Primary MD for the patient is SMITH,KRISTI, MD  LOS - 0   Chief Complaint  Patient presents with  . Weakness       Admission HPI/Brief narrative: 64 year old male with a extensive past medical history of AAA s/p repair, peripheral artery disease, HLD, CAD, HTN, carotid artery stenosis, COPD, and seizures; Who presents with complaints of acute onset of weakness. Subjective:   Chris Payne today has, No headache, No chest pain, No abdominal pain - No Nausea,  No Cough - SOB,  feels much better today.  Assessment & Plan    Principal Problem:   Generalized weakness Active Problems:   Hyperlipidemia   COPD (chronic obstructive pulmonary disease) (HCC)   CAD (coronary artery disease)   S/P AAA repair   History of pontine CVA   PAD (peripheral artery disease) (HCC)   Polycythemia   BPH (benign prostatic hyperplasia)  Generalized weakness - Neurology consultation appreciated, patient with history of prior CVA, chronic leg weakness ,patient presents with chronic generalized weakness, low suspicion for CVA, 0000000 and folic acid within normal limits. - MRI cervical spine with mild spinal stenosis at C3-C4, multilevel neural foraminal narrowing moderate to severe bilaterally at C6-7. - To be seen by PT  Chronic back pain - Agent has scheduled follow-up with pain clinic in January with CPS is current as well. - Continue with home pain regimen   hypocalcemia - Repleted  Hypertension - Continue with home medication  CAD - Continue with aspirin, Plavix, isosorbide mononitrate - Denies any chest pain or shortness of breath  Hyperlipidemia - Continue with statin  COPD - No active wheezing  History of seizures - Continue  with Keppra  Code Status:Full  Family Communication: none at bedside  Disposition Plan: pending PT   Procedures  none   Consults   neuro   Medications  Scheduled Meds: . aspirin EC  81 mg Oral Daily  . B-complex with vitamin C  1 tablet Oral Daily  . clopidogrel  75 mg Oral Daily  . enoxaparin (LOVENOX) injection  30 mg Subcutaneous Q24H  . furosemide  20 mg Oral Daily  . gabapentin  100 mg Oral Daily  . gabapentin  200 mg Oral QHS  . isosorbide mononitrate  120 mg Oral Daily  . levETIRAcetam  500 mg Oral BID  . metoprolol tartrate  12.5 mg Oral BID  . morphine  30 mg Oral Q12H  . ranolazine  1,000 mg Oral BID  . rosuvastatin  40 mg Oral Daily  . tamsulosin  0.4 mg Oral QHS  . vitamin B-12  100 mcg Oral Daily   Continuous Infusions:  PRN Meds:.acetaminophen **OR** acetaminophen, albuterol, HYDROcodone-acetaminophen, nitroGLYCERIN, ondansetron **OR** ondansetron (ZOFRAN) IV  DVT Prophylaxis  Lovenox  Lab Results  Component Value Date   PLT 163 04/16/2015    Antibiotics    Anti-infectives    None          Objective:   Filed Vitals:   04/17/15 0500 04/17/15 0650 04/17/15 0916 04/17/15 1100  BP: 118/61 120/64 134/65 110/66  Pulse: 53 52  55 55  Temp: 97.7 F (36.5 C) 98.2 F (36.8 C) 98 F (36.7 C) 98.1 F (36.7 C)  TempSrc: Oral Oral Oral Oral  Resp: 16 18 18 18   Height:      Weight:      SpO2: 94% 96% 95% 100%    Wt Readings from Last 3 Encounters:  04/17/15 71.8 kg (158 lb 4.6 oz)  03/26/15 61.009 kg (134 lb 8 oz)  03/22/15 68.947 kg (152 lb)     Intake/Output Summary (Last 24 hours) at 04/17/15 1234 Last data filed at 04/17/15 0112  Gross per 24 hour  Intake      0 ml  Output    200 ml  Net   -200 ml     Physical Exam  Awake Alert, Oriented X 3,Normal affect Chris Payne,PERRAL Supple Neck,No JVD, No cervical lymphadenopathy appriciated.  Symmetrical Chest wall movement, Good air movement bilaterally, CTAB RRR,No Gallops,Rubs or  new Murmurs, No Parasternal Heave +ve B.Sounds, Abd Soft, No tenderness, No organomegaly appriciated, No rebound - guarding or rigidity. No Cyanosis, Clubbing or edema, No new Rash or bruise    Data Review   Micro Results No results found for this or any previous visit (from the past 240 hour(s)).  Radiology Reports Dg Chest 2 View  04/17/2015  CLINICAL DATA:  Weakness for 2 hours. History of stroke, hypertension, seizures, COPD. EXAM: CHEST  2 VIEW COMPARISON:  Chest radiograph February 08, 2015 FINDINGS: The cardiac silhouette is mildly enlarged and unchanged. Mediastinal silhouette is nonsuspicious, calcified aortic knob. Status post median sternotomy for CABG. No pleural effusion or focal consolidation. Similar strandy densities LEFT lung base. Similar pleural thickening in the costophrenic angles. No pneumothorax. Soft tissue planes and included osseous structure nonsuspicious. Surgical clips project at [employer] junction. IMPRESSION: Stable cardiomegaly.  Stable LEFT lung base atelectasis/ scarring. Electronically Signed   By: Elon Alas M.D.   On: 04/17/2015 00:02   Dg Lumbar Spine Complete  04/17/2015  CLINICAL DATA:  64 year old male chest with stroke and weakness complaining of chronic back pain. EXAM: LUMBAR SPINE - COMPLETE 4+ VIEW COMPARISON:  Lumbar spine MRI dated 09/16/2013 FINDINGS: There is diffuse osteopenia. No definite acute fracture identified. There multilevel degenerative changes of the spine. Old appearing compression deformity of the lower thoracic spine. There is no acute subluxation. Left hip fixation screw is partially visualized. Multiple surgical clips noted over the mid abdomen. Vascular calcification noted in the upper abdomen. There is constipation. IMPRESSION: Osteopenia with multilevel degenerative changes of spine. No definite acute fracture. Electronically Signed   By: Anner Crete M.D.   On: 04/17/2015 00:04   Ct Head Wo Contrast  04/16/2015  CLINICAL  DATA:  Acute onset of bilateral weakness and tremors. EXAM: CT HEAD WITHOUT CONTRAST TECHNIQUE: Contiguous axial images were obtained from the base of the skull through the vertex without intravenous contrast. COMPARISON:  10/12/2014. FINDINGS: Stable remote left frontal infarct. The ventricles are in the midline without mass effect or shift. Cavum septum pellucidum again noted. No findings for acute hemispheric infarction or intracranial hemorrhage. No mass lesions. The brainstem and cerebellum are grossly normal. Chronic white matter changes versus small lacunar type infarcts in the brainstem. No significant bony findings. The paranasal sinuses and mastoid air cells are stable. Scattered mucous retention cysts or polyps in the ethmoid and maxillary sinuses. IMPRESSION: Remote left frontal infarction. No new/ acute intracranial findings Electronically Signed   By: Marijo Sanes M.D.   On: 04/16/2015 23:53  Mr Cervical Spine Wo Contrast  04/17/2015  CLINICAL DATA:  Worsening weakness beginning this evening. History of hypertension, seizures and stroke. EXAM: MRI CERVICAL SPINE WITHOUT CONTRAST TECHNIQUE: Multiplanar, multisequence MR imaging of the cervical spine was performed. No intravenous contrast was administered. COMPARISON:  CT angiogram of the head and neck Sep 22, 2013 FINDINGS: Cervical vertebral bodies and posterior elements are intact and aligned with maintenance of the cervical lordosis. Moderate C6-7 disc height loss, remaining cervical disc morphology is generally preserved. Decreased T2 signal within all cervical disc compatible with mild desiccation. Moderate chronic discogenic endplate changes D34-534, D34-534, mild at C5-6. No STIR signal abnormality to suggest acute osseous process. Cervical spinal cord is normal morphology and signal characteristics though, mild patient motion degrades sensitivity for potential subtle cord signal abnormality. Craniocervical junction intact. No MR findings of  ligamentous injury. Included view of the brain demonstrates patchy pontine T2 hyperintensities compatible with infarcts. Included prevertebral and paraspinal soft tissues are nonsuspicious. Level by level evaluation: C2-3: Small broad-based central disc protrusion. Uncovertebral hypertrophy and mild facet arthropathy without canal stenosis. Mild LEFT neural foraminal narrowing. C3-4: Small broad-based disc bulge. Small LEFT subarticular disc protrusion. Uncovertebral hypertrophy and mild facet arthropathy on the LEFT. Mild canal stenosis. Mild RIGHT, moderate LEFT neural foraminal narrowing. C4-5: Small central disc protrusion and broad-based disc bulge. Uncovertebral hypertrophy. Moderate facet arthropathy. No canal stenosis. Moderate LEFT neural foraminal narrowing. C5-6: Small central disc protrusion and broad-based disc bulge. Uncovertebral hypertrophy and mild facet arthropathy without canal stenosis or neural foraminal narrowing. C6-7: Small broad-based disc bulge. Uncovertebral hypertrophy and moderate facet arthropathy without canal stenosis. Moderate to severe bilateral neural foraminal narrowing. C7-T1: No disc bulge, canal stenosis nor neural foraminal narrowing. IMPRESSION: Degenerative cervical spine without acute fracture or malalignment. Mild canal stenosis C3-4. Multilevel neural foraminal narrowing: Moderate to severe bilaterally at C6-7. Electronically Signed   By: Elon Alas M.D.   On: 04/17/2015 01:13     CBC  Recent Labs Lab 04/16/15 2318 04/16/15 2322  WBC 6.5  --   HGB 16.7 17.7*  HCT 50.7 52.0  PLT 163  --   MCV 105.4*  --   MCH 34.7*  --   MCHC 32.9  --   RDW 12.9  --   LYMPHSABS 2.2  --   MONOABS 0.7  --   EOSABS 0.1  --   BASOSABS 0.0  --     Chemistries   Recent Labs Lab 04/16/15 2318 04/16/15 2322 04/17/15 0510  NA 141 141  --   K 4.5 4.4  --   CL 106 103  --   CO2 25  --   --   GLUCOSE 130* 120*  --   BUN 12 15  --   CREATININE 0.79 0.70  --     CALCIUM 9.1  --   --   MG  --   --  2.0  AST 19  --   --   ALT 19  --   --   ALKPHOS 60  --   --   BILITOT 0.9  --   --    ------------------------------------------------------------------------------------------------------------------ estimated creatinine clearance is 86.1 mL/min (by C-G formula based on Cr of 0.7). ------------------------------------------------------------------------------------------------------------------ No results for input(s): HGBA1C in the last 72 hours. ------------------------------------------------------------------------------------------------------------------ No results for input(s): CHOL, HDL, LDLCALC, TRIG, CHOLHDL, LDLDIRECT in the last 72 hours. ------------------------------------------------------------------------------------------------------------------ No results for input(s): TSH, T4TOTAL, T3FREE, THYROIDAB in the last 72 hours.  Invalid input(s): FREET3 ------------------------------------------------------------------------------------------------------------------  Recent  Labs  04/17/15 0510  VITAMINB12 667  FOLATE 12.4    Coagulation profile  Recent Labs Lab 04/16/15 2318  INR 1.07    No results for input(s): DDIMER in the last 72 hours.  Cardiac Enzymes No results for input(s): CKMB, TROPONINI, MYOGLOBIN in the last 168 hours.  Invalid input(s): CK ------------------------------------------------------------------------------------------------------------------ Invalid input(s): POCBNP     Time Spent in minutes   No charge   [facility], DAWOOD M.D on 04/17/2015 at 12:34 PM  Between 7am to 7pm - Pager - [phone]  After 7pm go to [email or web address] - password [email or web address]  [facility]   Office  [phone]

## 2015-04-17 NOTE — Evaluation (Addendum)
Physical Therapy Evaluation Patient Details Name: Chris Payne MRN: [id number] DOB: [birth year] Today's Date: 04/17/2015   History of Present Illness  Chris Payne is a 64 year old male with a extensive past medical history of AAA s/p repair, peripheral artery disease, HLD, CAD, HTN, carotid artery stenosis, COPD, and seizures; Who presents with complaints of acute onset of weakness.  MRI c-spine Mild canal stenosis C3-4. Multilevel neural foraminal narrowing, CT of head shows remote L frontal infarct.  Clinical Impression  Patient presents with decreased independence with mobility due to deficits listed in PT problem list.  He will benefit from skilled PT in the acute setting to allow return home with family support and HHPT.    Follow Up Recommendations Home health PT;Supervision/Assistance - 24 hour    Equipment Recommendations  None recommended by PT    Recommendations for Other Services       Precautions / Restrictions Precautions Precautions: Fall Precaution Comments: reports his legs give out Required Braces or Orthoses: Other Brace/Splint Other Brace/Splint: L leg length discrepancy due to hip pinning years ago, has built up shoe at home      Mobility  Bed Mobility Overal bed mobility: Modified Independent                Transfers Overall transfer level: Needs assistance   Transfers: Sit to/from Stand Sit to Stand: Supervision;Min guard         General transfer comment: steadying assist  Ambulation/Gait Ambulation/Gait assistance: Min assist;Min guard Ambulation Distance (Feet): 120 Feet (and 40') Assistive device: Rolling walker (2 wheeled) Gait Pattern/deviations: Trunk flexed;Steppage;Decreased dorsiflexion - left;Wide base of support     General Gait Details: large leg length discrepancy evident, fatigued with ambulation and legs shaky so needed to get a chair in the hall for pt to rest prior to getting back to his room.  Stairs             Wheelchair Mobility    Modified Rankin (Stroke Patients Only)       Balance Overall balance assessment: Needs assistance   Sitting balance-Leahy Scale: Good     Standing balance support: Bilateral upper extremity supported Standing balance-Leahy Scale: Poor Standing balance comment: UE support needed due to flexed posture and poor core strength                             Pertinent Vitals/Pain      Home Living Family/patient expects to be discharged to:: Private residence Living Arrangements: Spouse/significant other Available Help at Discharge: Family;Available 24 hours/day Type of Home: House Home Access: Stairs to enter   [employer] of Steps: 1 in back Home Layout: Two level Home Equipment: Tub bench;Walker - 2 wheels;Walker - 4 wheels;Wheelchair - manual;Hospital bed Additional Comments: states rollator seat is broken and hospital bed is old and in need of replacement    Prior Function Level of Independence: Needs assistance      ADL's / Homemaking Assistance Needed: wife assists to wash his back        Hand Dominance        Extremity/Trunk Assessment                 RLE Deficits / Details: AROM grossly WFL, strength 4/5 LLE Deficits / Details: AROM decreased ankle DF with increased tone noted initially in ankle/foot, but improved with ambulation.edema noted in R ankle/foot     Communication   Communication: No difficulties  Cognition  Arousal/Alertness: Awake/alert Behavior During Therapy: WFL for tasks assessed/performed Overall Cognitive Status: Within Functional Limits for tasks assessed                      General Comments      Exercises        Assessment/Plan    PT Assessment Patient needs continued PT services  PT Diagnosis Abnormality of gait;Generalized weakness   PT Problem List Decreased strength;Decreased activity tolerance;Decreased balance;Decreased mobility;Pain  PT Treatment  Interventions DME instruction;Balance training;Gait training;Functional mobility training;Patient/family education;Therapeutic activities;Therapeutic exercise   PT Goals (Current goals can be found in the Care Plan section) Acute Rehab PT Goals Patient Stated Goal: To get stronger PT Goal Formulation: With patient Time For Goal Achievement: 04/24/15 Potential to Achieve Goals: Good    Frequency Min 3X/week   Barriers to discharge        Co-evaluation               End of Session Equipment Utilized During Treatment: Gait belt Activity Tolerance: Patient limited by fatigue Patient left: in chair;with call bell/phone within reach;with chair alarm set      Functional Assessment Tool Used: Clinical Observation Functional Limitation: Mobility: Walking and moving around Mobility: Walking and Moving Around Current Status [phone]): At least 20 percent but less than 40 percent impaired, limited or restricted Mobility: Walking and Moving Around Goal Status [phone]): At least 20 percent but less than 40 percent impaired, limited or restricted Mobility: Walking and Moving Around Discharge Status [phone]): At least 20 percent but less than 40 percent impaired, limited or restricted    Time: 1136-1200 PT Time Calculation (min) (ACUTE ONLY): 24 min   Charges:   PT Evaluation $Initial PT Evaluation Tier I: 1 Procedure PT Treatments $Gait Training: 8-22 mins   PT G Codes:   PT G-Codes **NOT FOR INPATIENT CLASS** Functional Assessment Tool Used: Clinical Observation Functional Limitation: Mobility: Walking and moving around Mobility: Walking and Moving Around Current Status [id number]): At least 20 percent but less than 40 percent impaired, limited or restricted Mobility: Walking and Moving Around Goal Status [phone]): At least 20 percent but less than 40 percent impaired, limited or restricted Mobility: Walking and Moving Around Discharge Status [phone]): At least 20 percent but less than 40  percent impaired, limited or restricted    Reginia Naas 04/17/2015, 4:41 PM  Magda Kiel, [place] 04/17/2015

## 2015-04-17 NOTE — Discharge Summary (Signed)
Chris Payne, is a 64 y.o. male  DOB June 05, 1950  MRN [id number].  Admission date:  04/16/2015  Admitting Physician  Chris Morton, MD  Discharge Date:  04/17/2015   Primary MD  Chris Forts, MD  Recommendations for primary care physician for things to follow:  - Basic labs including CBC, BMP during next visit.   Admission Diagnosis  Weakness [R53.1]   Discharge Diagnosis  Weakness [R53.1]    Principal Problem:   Generalized weakness Active Problems:   Hyperlipidemia   COPD (chronic obstructive pulmonary disease) (HCC)   CAD (coronary artery disease)   S/P AAA repair   History of pontine CVA   PAD (peripheral artery disease) (HCC)   Weakness   Polycythemia   BPH (benign prostatic hyperplasia)      Past Medical History  Diagnosis Date  . Hypertension   . Stroke [facility])     h/o pontine CVA  . AAA (abdominal aortic aneurysm) ([place])     s/p repair 6/11  . Hyperlipidemia   . CAD (coronary artery disease)     s/p CABG 2/12:  LIMA to LAD, SVG to diagonal-1, SVG to ramus intermedius,, SVG to AM (Dr. Roxan Payne) ;  b.  Myoview 10/13:  inf infarct with very mild peri-infarct ischemia, EF 49%, inf HK; c  He has severe three-vessel native coronary artery disease. Catheterization March 2015 as above  . Carotid stenosis     a.  dopplers 0000000: LICA 0000000;  b. Carotid dopplers 3/14:  R 0-39%, L 60-79%, repeat 6 mos  . PAD (peripheral artery disease) ([place])     ABIs 4/12:  R 0.88, L 0.92; R SFA 40%, L CFA 50%, L SFA 50-60%  . COPD (chronic obstructive pulmonary disease) ([place])   . Inguinal hernia   . Thyroid nodule     incidental finding on carotid doppler 3/14 => thyroid U/S ordered  . Pneumonia     hx  . GERD (gastroesophageal reflux disease)   . Myocardial infarction [facility])     PCI x3  . [id number])     Past Surgical History  Procedure Laterality Date  . Hip surgery  40 years ago   left hip bone removal and pinning  . Abdominal aortic aneurysm repair  10-03-2009  . Exploratory laparotomy  09/2009    ligation of lumbar arteries  . Coronary artery bypass graft  06/24/2010    LIMA to the LAD, SVG to first diagonal, SVG to ramus intermediate, SVG to acute marginal. EF 50%. 2/12  . External ear surgery Left     laceration child  . Inguinal hernia repair Left 07/21/2012    Procedure: HERNIA REPAIR INGUINAL ADULT;  Surgeon: Chris Roof, MD;  Location: [place];  Service: General;  Laterality: Left;  . Insertion of mesh Left 07/21/2012    Procedure: INSERTION OF MESH;  Surgeon: Chris Roof, MD;  Location: [place];  Service: General;  Laterality: Left;  . Exploration post operative open heart    . Nm myocar  perf wall motion  12/02/2010    inferoapical defect is fixed c/w prior infarction/scar, there is minimal borderzone ischemia.  . Left heart catheterization with coronary/graft angiogram  06/21/2012    Procedure: LEFT HEART CATHETERIZATION WITH Chris Payne;  Surgeon: Chris Breeding, MD;  Location: [facility] CATH LAB;  Service: Cardiovascular;;  . Left heart catheterization with coronary/graft angiogram N/A 07/04/2013    Procedure: LEFT HEART CATHETERIZATION WITH Chris Payne;  Surgeon: Chris Grooms, MD;  Location: [facility] CATH LAB;  Service: Cardiovascular;  Laterality: N/A;       History of present illness and  Hospital Course:     Kindly see H&P for history of present illness and admission details, please review complete Labs, Consult reports and Test reports for all details in brief  HPI  from the history and physical done on the day of admission [name] is a 64 year old male with a extensive past medical history of AAA s/p repair, peripheral artery disease, HLD, CAD, HTN, carotid artery stenosis, COPD, and seizures; Who presents with complaints of acute onset of weakness. Reported to be last seen normal prior to around 9:30 PM when he woke up From a  nap and noticed the symptoms. Reports weakness All over his whole body with associated symptoms of feeling very hot all over as though he was burning up and subsequently noting shaking all over. Patient denies any loss of consciousness or recent seizures. Denies any drinking or tobacco use since 2011/2012. He notes that he has a history of chronic back pain issues for which he is on multiple pain medications and CC management. He notes that he has an appointment to go to [facility] in [place] on April 29 2016 to receive injections in his back. Denies having any chest pain, shortness of breath, inability to feel the need to urinate, or any recent falls. At baseline the patient ambulates with a Walker. Patient reports his last fall was over 3 months ago.   Upon arrival to the emergency department a code stroke was called. Initial CT scan of the brain was negative neurology was consulted a evaluated patient and recommended a MRI of the spine.    Hospital Course  Generalized weakness - Neurology consultation appreciated, patient with history of prior CVA, chronic leg weakness ,patient presents with chronic generalized weakness, neurology felt low suspicion for CVA,  - symptoms most likely due to cervical radiculopathy, chronic lower back pain and hypocalcemia. - Patient reports he is currently at baseline, ambulated with PT, will recommend home PT -  0000000 and folic acid within normal limits. - MRI cervical spine with mild spinal stenosis at C3-C4, multilevel neural foraminal narrowing moderate to severe bilaterally at C6-7, seen by pain clinic at [facility]. - Home health PT  Chronic back pain - Patient has scheduled follow-up with pain clinic in January with CPS is current as well. - Continue with home pain regimen, given prescription for 10 tablets of Norco, and 10 tablets of MS Contin.  hypocalcemia - Repleted  Hypertension - Continue with home medication  CAD - Continue with  aspirin, Plavix, isosorbide mononitrate - Denies any chest pain or shortness of breath  Hyperlipidemia - Continue with statin  COPD - No active wheezing  History of seizures - Continue with Keppra    Discharge Condition:  Stable   Follow UP  Follow-up Information    Follow up with SMITH,KRISTI, MD. Schedule an appointment as soon as possible for a visit in 1 week.   Specialty:  Family Medicine   Why:  post hospitalization follow-up   Contact information:   [place] [id number] [phone]         Discharge Instructions  and  Discharge Medications     Discharge Instructions    Discharge instructions    Complete by:  As directed   Follow with Primary MD Chris Forts, MD in 7 days   Get CBC, CMP,  checked  by Primary MD next visit.    Activity: As tolerated with Full fall precautions use walker/cane & assistance as needed   Disposition Home    Diet: Heart Healthy  , with feeding assistance and aspiration precautions.  For Heart failure patients - Check your Weight same time everyday, if you gain over 2 pounds, or you develop in leg swelling, experience more shortness of breath or chest pain, call your Primary MD immediately. Follow Cardiac Low Salt Diet and 1.5 lit/day fluid restriction.   On your next visit with your primary care physician please Get Medicines reviewed and adjusted.   Please request your Prim.MD to go over all Hospital Tests and Procedure/Radiological results at the follow up, please get all Hospital records sent to your Prim MD by signing hospital release before you go home.   If you experience worsening of your admission symptoms, develop shortness of breath, life threatening emergency, suicidal or homicidal thoughts you must seek medical attention immediately by calling 911 or calling your MD immediately  if symptoms less severe.  You Must read complete instructions/literature along with all the possible adverse  reactions/side effects for all the Medicines you take and that have been prescribed to you. Take any new Medicines after you have completely understood and accpet all the possible adverse reactions/side effects.   Do not drive, operating heavy machinery, perform activities at heights, swimming or participation in water activities or provide baby sitting services if your were admitted for syncope or siezures until you have seen by Primary MD or a Neurologist and advised to do so again.  Do not drive when taking Pain medications.    Do not take more than prescribed Pain, Sleep and Anxiety Medications  Special Instructions: If you have smoked or chewed Tobacco  in the last 2 yrs please stop smoking, stop any regular Alcohol  and or any Recreational drug use.  Wear Seat belts while driving.   Please note  You were cared for by a hospitalist during your hospital stay. If you have any questions about your discharge medications or the care you received while you were in the hospital after you are discharged, you can call the unit and asked to speak with the hospitalist on call if the hospitalist that took care of you is not available. Once you are discharged, your primary care physician will handle any further medical issues. Please note that NO REFILLS for any discharge medications will be authorized once you are discharged, as it is imperative that you return to your primary care physician (or establish a relationship with a primary care physician if you do not have one) for your aftercare needs so that they can reassess your need for medications and monitor your lab values.     Increase activity slowly    Complete by:  As directed             Medication List    STOP taking these medications        ciprofloxacin 500 MG tablet  Commonly known as:  CIPRO  TAKE these medications        aspirin EC 81 MG tablet  Take 81 mg by mouth every morning.     B COMPLEX-VITAMIN B12 Tabs  Take 1  tablet by mouth daily.     clopidogrel 75 MG tablet  Commonly known as:  PLAVIX  Take 1 tablet (75 mg total) by mouth daily with breakfast.     furosemide 20 MG tablet  Commonly known as:  LASIX  Take 1 tablet (20 mg total) by mouth daily.     gabapentin 100 MG capsule  Commonly known as:  NEURONTIN  TAKE 1 CAPSULE BY MOUTH EACH MORNING AND2 CAPSULES AT BEDTIME AS DIRECTED     HYDROcodone-acetaminophen 10-325 MG tablet  Commonly known as:  NORCO  Take 1 tablet by mouth every 4 (four) hours as needed for moderate pain.     isosorbide mononitrate 120 MG 24 hr tablet  Commonly known as:  IMDUR  Take 1 tablet (120 mg total) by mouth daily.     levETIRAcetam 500 MG tablet  Commonly known as:  KEPPRA  Take 1 tablet (500 mg total) by mouth 2 (two) times daily.     metoprolol tartrate 25 MG tablet  Commonly known as:  LOPRESSOR  Take 0.5 tablets (12.5 mg total) by mouth 2 (two) times daily.     morphine 30 MG 12 hr tablet  Commonly known as:  MS CONTIN  Take 1 tablet (30 mg total) by mouth every 12 (twelve) hours.     nitroGLYCERIN 0.4 MG SL tablet  Commonly known as:  NITROSTAT  Place 0.4 mg under the tongue every 5 (five) minutes as needed for chest pain.     ranolazine 1000 MG SR tablet  Commonly known as:  RANEXA  Take 1 tablet (1,000 mg total) by mouth 2 (two) times daily.     rosuvastatin 40 MG tablet  Commonly known as:  CRESTOR  Take 1 tablet (40 mg total) by mouth daily.     tamsulosin 0.4 MG Caps capsule  Commonly known as:  FLOMAX  Take 0.4 mg by mouth at bedtime.          Diet and Activity recommendation: See Discharge Instructions above   Consults obtained -  Neurology   Major procedures and Radiology Reports - PLEASE review detailed and final reports for all details, in brief -      Dg Chest 2 View  04/17/2015  CLINICAL DATA:  Weakness for 2 hours. History of stroke, hypertension, seizures, COPD. EXAM: CHEST  2 VIEW COMPARISON:  Chest  radiograph February 08, 2015 FINDINGS: The cardiac silhouette is mildly enlarged and unchanged. Mediastinal silhouette is nonsuspicious, calcified aortic knob. Status post median sternotomy for CABG. No pleural effusion or focal consolidation. Similar strandy densities LEFT lung base. Similar pleural thickening in the costophrenic angles. No pneumothorax. Soft tissue planes and included osseous structure nonsuspicious. Surgical clips project at [employer] junction. IMPRESSION: Stable cardiomegaly.  Stable LEFT lung base atelectasis/ scarring. Electronically Signed   By: Elon Alas M.D.   On: 04/17/2015 00:02   Dg Lumbar Spine Complete  04/17/2015  CLINICAL DATA:  64 year old male chest with stroke and weakness complaining of chronic back pain. EXAM: LUMBAR SPINE - COMPLETE 4+ VIEW COMPARISON:  Lumbar spine MRI dated 09/16/2013 FINDINGS: There is diffuse osteopenia. No definite acute fracture identified. There multilevel degenerative changes of the spine. Old appearing compression deformity of the lower thoracic spine. There is no acute subluxation. Left hip fixation screw  is partially visualized. Multiple surgical clips noted over the mid abdomen. Vascular calcification noted in the upper abdomen. There is constipation. IMPRESSION: Osteopenia with multilevel degenerative changes of spine. No definite acute fracture. Electronically Signed   By: Anner Crete M.D.   On: 04/17/2015 00:04   Ct Head Wo Contrast  04/16/2015  CLINICAL DATA:  Acute onset of bilateral weakness and tremors. EXAM: CT HEAD WITHOUT CONTRAST TECHNIQUE: Contiguous axial images were obtained from the base of the skull through the vertex without intravenous contrast. COMPARISON:  10/12/2014. FINDINGS: Stable remote left frontal infarct. The ventricles are in the midline without mass effect or shift. Cavum septum pellucidum again noted. No findings for acute hemispheric infarction or intracranial hemorrhage. No mass lesions. The brainstem  and cerebellum are grossly normal. Chronic white matter changes versus small lacunar type infarcts in the brainstem. No significant bony findings. The paranasal sinuses and mastoid air cells are stable. Scattered mucous retention cysts or polyps in the ethmoid and maxillary sinuses. IMPRESSION: Remote left frontal infarction. No new/ acute intracranial findings Electronically Signed   By: Marijo Sanes M.D.   On: 04/16/2015 23:53   Mr Cervical Spine Wo Contrast  04/17/2015  CLINICAL DATA:  Worsening weakness beginning this evening. History of hypertension, seizures and stroke. EXAM: MRI CERVICAL SPINE WITHOUT CONTRAST TECHNIQUE: Multiplanar, multisequence MR imaging of the cervical spine was performed. No intravenous contrast was administered. COMPARISON:  CT angiogram of the head and neck Sep 22, 2013 FINDINGS: Cervical vertebral bodies and posterior elements are intact and aligned with maintenance of the cervical lordosis. Moderate C6-7 disc height loss, remaining cervical disc morphology is generally preserved. Decreased T2 signal within all cervical disc compatible with mild desiccation. Moderate chronic discogenic endplate changes D34-534, D34-534, mild at C5-6. No STIR signal abnormality to suggest acute osseous process. Cervical spinal cord is normal morphology and signal characteristics though, mild patient motion degrades sensitivity for potential subtle cord signal abnormality. Craniocervical junction intact. No MR findings of ligamentous injury. Included view of the brain demonstrates patchy pontine T2 hyperintensities compatible with infarcts. Included prevertebral and paraspinal soft tissues are nonsuspicious. Level by level evaluation: C2-3: Small broad-based central disc protrusion. Uncovertebral hypertrophy and mild facet arthropathy without canal stenosis. Mild LEFT neural foraminal narrowing. C3-4: Small broad-based disc bulge. Small LEFT subarticular disc protrusion. Uncovertebral hypertrophy and  mild facet arthropathy on the LEFT. Mild canal stenosis. Mild RIGHT, moderate LEFT neural foraminal narrowing. C4-5: Small central disc protrusion and broad-based disc bulge. Uncovertebral hypertrophy. Moderate facet arthropathy. No canal stenosis. Moderate LEFT neural foraminal narrowing. C5-6: Small central disc protrusion and broad-based disc bulge. Uncovertebral hypertrophy and mild facet arthropathy without canal stenosis or neural foraminal narrowing. C6-7: Small broad-based disc bulge. Uncovertebral hypertrophy and moderate facet arthropathy without canal stenosis. Moderate to severe bilateral neural foraminal narrowing. C7-T1: No disc bulge, canal stenosis nor neural foraminal narrowing. IMPRESSION: Degenerative cervical spine without acute fracture or malalignment. Mild canal stenosis C3-4. Multilevel neural foraminal narrowing: Moderate to severe bilaterally at C6-7. Electronically Signed   By: Elon Alas M.D.   On: 04/17/2015 01:13    Micro Results     No results found for this or any previous visit (from the past 240 hour(s)).     Today   Subjective:   [name] today has , No headache, No chest pain, No abdominal pain - No Nausea, No Cough - SOB, feels much better today. Objective:   Blood pressure 107/64, pulse 63, temperature 98.4 F (36.9 C), temperature  source Oral, resp. rate 18, height 5\' 7"  (1.702 m), weight 71.8 kg (158 lb 4.6 oz), SpO2 95 %.   Intake/Output Summary (Last 24 hours) at 04/17/15 1510 Last data filed at 04/17/15 0112  Gross per 24 hour  Intake      0 ml  Output    200 ml  Net   -200 ml    Exam Awake Alert, Oriented x 3, Normal affect [place].AT,PERRAL Supple Neck,No JVD, No cervical lymphadenopathy appriciated.  Symmetrical Chest wall movement, Good air movement bilaterally, CTAB RRR,No Gallops,Rubs or new Murmurs, No Parasternal Heave +ve B.Sounds, Abd Soft, Non tender, No organomegaly appriciated, No rebound -guarding or rigidity. No  Cyanosis, Clubbing or edema, No new Rash or bruise  Data Review   CBC w Diff: Lab Results  Component Value Date   WBC 6.5 04/16/2015   WBC 5.4 02/08/2015   HGB 17.7* 04/16/2015   HGB 15.9 02/08/2015   HCT 52.0 04/16/2015   HCT 47.3 02/08/2015   PLT 163 04/16/2015   LYMPHOPCT 33 04/16/2015   MONOPCT 10 04/16/2015   EOSPCT 2 04/16/2015   BASOPCT 0 04/16/2015    CMP: Lab Results  Component Value Date   NA 141 04/16/2015   K 4.4 04/16/2015   CL 103 04/16/2015   CO2 25 04/16/2015   BUN 15 04/16/2015   CREATININE 0.70 04/16/2015   CREATININE 0.83 03/09/2014   PROT 5.7* 04/16/2015   ALBUMIN 3.1* 04/16/2015   BILITOT 0.9 04/16/2015   ALKPHOS 60 04/16/2015   AST 19 04/16/2015   ALT 19 04/16/2015  .   Total Time in preparing paper work, data evaluation and todays exam - 35 minutes  ELGERGAWY, DAWOOD M.D on 04/17/2015 at 3:10 PM  [facility]   Office  [phone]

## 2015-04-17 NOTE — Care Management Note (Signed)
Case Management Note  Patient Details  Name: Chris Payne MRN: [id number] Date of Birth: 06-Dec-1950  Subjective/Objective:                    Action/Plan: Patient being discharged home today with home health services. CM met with the patient and provided him a list of home health agencies in the [facility] area. He selected [facility]. [name] with [facility] notified and accepted the referral. Patient states he and his wife have trouble getting food at times. SW included in home health services and CM called and spoke with [facility] in [place]. They stated that if patient was out of the hot meal area that they could come and pick up frozen meals every 2 weeks. Patient was very interested in this service and CM provided him with the number to call and set up the meal service. Bedside RN updated.   Expected Discharge Date:                  Expected Discharge Plan:  [place]  In-House Referral:     Discharge planning Services  CM Consult  Post Acute Care Choice:  Home Health Choice offered to:  Patient  DME Arranged:    DME Agency:     HH Arranged:  PT, Social Work [employer] Agency:  [place]  Status of Service:  Completed, signed off  Medicare Important Message Given:    Date Medicare IM Given:    Medicare IM give by:    Date Additional Medicare IM Given:    Additional Medicare Important Message give by:     If discussed at [place] of Stay Meetings, dates discussed:    Additional Comments:  Pollie Friar, RN 04/17/2015, 3:57 PM

## 2015-04-17 NOTE — ED Notes (Signed)
Report attempted, RN to call back. 

## 2015-04-17 NOTE — Care Management Note (Signed)
Case Management Note  Patient Details  Name: Chris Payne MRN: [id number] Date of Birth: 1950-08-03  Subjective/Objective:    Patient admitted with generalized weakness. Patient is from home with wife.                 Action/Plan: Awaiting PT/OT recommendations. CM will continue to follow for discharge needs.   Expected Discharge Date:                  Expected Discharge Plan:     In-House Referral:     Discharge planning Services     Post Acute Care Choice:    Choice offered to:     DME Arranged:    DME Agency:     HH Arranged:    HH Agency:     Status of Service:  In process, will continue to follow  Medicare Important Message Given:    Date Medicare IM Given:    Medicare IM give by:    Date Additional Medicare IM Given:    Additional Medicare Important Message give by:     If discussed at [place] of Stay Meetings, dates discussed:    Additional Comments:  Pollie Friar, RN 04/17/2015, 2:40 PM

## 2015-04-17 NOTE — Discharge Instructions (Signed)
Follow with Primary MD Reginia Forts, MD in 7 days   Get CBC, CMP,  checked  by Primary MD next visit.    Activity: As tolerated with Full fall precautions use walker/cane & assistance as needed   Disposition Home    Diet: Heart Healthy  , with feeding assistance and aspiration precautions.  For Heart failure patients - Check your Weight same time everyday, if you gain over 2 pounds, or you develop in leg swelling, experience more shortness of breath or chest pain, call your Primary MD immediately. Follow Cardiac Low Salt Diet and 1.5 lit/day fluid restriction.   On your next visit with your primary care physician please Get Medicines reviewed and adjusted.   Please request your Prim.MD to go over all Hospital Tests and Procedure/Radiological results at the follow up, please get all Hospital records sent to your Prim MD by signing hospital release before you go home.   If you experience worsening of your admission symptoms, develop shortness of breath, life threatening emergency, suicidal or homicidal thoughts you must seek medical attention immediately by calling 911 or calling your MD immediately  if symptoms less severe.  You Must read complete instructions/literature along with all the possible adverse reactions/side effects for all the Medicines you take and that have been prescribed to you. Take any new Medicines after you have completely understood and accpet all the possible adverse reactions/side effects.   Do not drive, operating heavy machinery, perform activities at heights, swimming or participation in water activities or provide baby sitting services if your were admitted for syncope or siezures until you have seen by Primary MD or a Neurologist and advised to do so again.  Do not drive when taking Pain medications.    Do not take more than prescribed Pain, Sleep and Anxiety Medications  Special Instructions: If you have smoked or chewed Tobacco  in the last 2 yrs please  stop smoking, stop any regular Alcohol  and or any Recreational drug use.  Wear Seat belts while driving.   Please note  You were cared for by a hospitalist during your hospital stay. If you have any questions about your discharge medications or the care you received while you were in the hospital after you are discharged, you can call the unit and asked to speak with the hospitalist on call if the hospitalist that took care of you is not available. Once you are discharged, your primary care physician will handle any further medical issues. Please note that NO REFILLS for any discharge medications will be authorized once you are discharged, as it is imperative that you return to your primary care physician (or establish a relationship with a primary care physician if you do not have one) for your aftercare needs so that they can reassess your need for medications and monitor your lab values.

## 2015-04-17 NOTE — ED Notes (Signed)
Patient transported to CT 

## 2015-04-18 ENCOUNTER — Other Ambulatory Visit: Payer: Self-pay | Admitting: *Deleted

## 2015-04-18 NOTE — Patient Outreach (Signed)
[facility] [facility]) Care Management  04/18/2015  HILLARD YERKE 08/01/1950 [id number]  RN attempted to contact pt today as a reminder of tomorrow appointment due to his current temporary residence is at his son's home in [place] however unsuccessful. RN left a HIPAA approved voice message requesting a call back to verify pt's current location prior to the scheduled home visit for tomorrow. Will follow up accordingly with visit or telephonic call.   Raina Mina, RN Care Management Coordinator [place] [facility] Main Office [phone]

## 2015-04-19 ENCOUNTER — Other Ambulatory Visit: Payer: Self-pay | Admitting: *Deleted

## 2015-04-19 ENCOUNTER — Ambulatory Visit: Payer: Self-pay | Admitting: *Deleted

## 2015-04-19 DIAGNOSIS — I2510 Atherosclerotic heart disease of native coronary artery without angina pectoris: Secondary | ICD-10-CM | POA: Diagnosis not present

## 2015-04-19 DIAGNOSIS — I10 Essential (primary) hypertension: Secondary | ICD-10-CM | POA: Diagnosis not present

## 2015-04-19 DIAGNOSIS — E785 Hyperlipidemia, unspecified: Secondary | ICD-10-CM | POA: Diagnosis not present

## 2015-04-19 DIAGNOSIS — N400 Enlarged prostate without lower urinary tract symptoms: Secondary | ICD-10-CM | POA: Diagnosis not present

## 2015-04-19 DIAGNOSIS — J449 Chronic obstructive pulmonary disease, unspecified: Secondary | ICD-10-CM | POA: Diagnosis not present

## 2015-04-19 DIAGNOSIS — Z8673 Personal history of transient ischemic attack (TIA), and cerebral infarction without residual deficits: Secondary | ICD-10-CM | POA: Diagnosis not present

## 2015-04-19 DIAGNOSIS — I739 Peripheral vascular disease, unspecified: Secondary | ICD-10-CM | POA: Diagnosis not present

## 2015-04-19 NOTE — Patient Outreach (Signed)
[facility]) Care Management  04/19/2015  ZYHEEM TRAYWICK Jun 20, 1950 [id number]  Rn attempted to contact pt once again this morning to confirm home visit for later this morning however family member indicated pt went to an appointment in [place]. RN inquired on the provider being seen however family member did not know this information. RN also inquired if the pt would return home prior to the scheduled home visit with this RN today and family reply pt would not be home. RN requested family to inform pt to return a call to this RN to reschedule today's appointment for another day. Will await a response from pt however will also continue outreach calls accordingly to reschedule for ongoing [facility] services related to his ongoing medical issues.   Raina Mina, RN Care Management Coordinator [place] [facility] Main Office [phone]

## 2015-04-23 ENCOUNTER — Other Ambulatory Visit: Payer: Self-pay | Admitting: *Deleted

## 2015-04-23 NOTE — Patient Outreach (Signed)
[facility]) Care Management  04/23/2015  MONTA BARENTINE [birth year] [id number]   RN attempted to reach pt from missed appointment last week and reschedule however the number is now not valid. RN will attempt another outreach call this week and if not available will send outreach letter allow pt time to respond for ongoing [facility] services. Note RN spoke with son several days ago on the same line that is not available today.  Raina Mina, RN Care Management Coordinator [facility] Main Office [phone]

## 2015-04-24 ENCOUNTER — Ambulatory Visit (INDEPENDENT_AMBULATORY_CARE_PROVIDER_SITE_OTHER): Payer: Medicare Other | Admitting: Physician Assistant

## 2015-04-24 VITALS — BP 100/68 | HR 83 | Temp 98.3°F | Resp 20 | Wt 149.8 lb

## 2015-04-24 DIAGNOSIS — M519 Unspecified thoracic, thoracolumbar and lumbosacral intervertebral disc disorder: Secondary | ICD-10-CM | POA: Diagnosis not present

## 2015-04-24 DIAGNOSIS — Z23 Encounter for immunization: Secondary | ICD-10-CM

## 2015-04-24 MED ORDER — HYDROCODONE-ACETAMINOPHEN 10-325 MG PO TABS: 1.0000 | ORAL_TABLET | ORAL | Status: DC | PRN

## 2015-04-24 MED ORDER — MORPHINE SULFATE ER 30 MG PO TBCR: 30.0000 mg | EXTENDED_RELEASE_TABLET | Freq: Two times a day (BID) | ORAL | Status: DC

## 2015-04-24 NOTE — Progress Notes (Signed)
Patient ID: Chris Payne, male    DOB: [birth year], 64 y.o.   MRN: [id number]  PCP: Reginia Forts, MD  Subjective:   Chief Complaint  Patient presents with  . Medication Refill    morphine, hydrocodone    HPI Presents for evaluation of medication refill.  "I have chronic back pain. Out of my meds. [facility]. Can you line me up with the?" Pain is worse in the colder weather.  Sees pain management in [place]. Was transferred to that site from [place] due to distance from his home. Next visit is 05/30/14, for an injection. He reports that they canceled his remaining prescriptions because he filled them too early. It's not clear if he has been dismissed or just that they will no longer prescribe narcotics.  Most recent prescriptions were 12/21 (hydrocodone-APAP and MS Contin). 10 each, by the hospitalist.  He has Lumbar DDD. Was previously on gabapentin, but ran out.  Review of Systems     Patient Active Problem List   Diagnosis Date Noted  . Weakness 04/17/2015  . Generalized weakness 04/17/2015  . Polycythemia 04/17/2015  . BPH (benign prostatic hyperplasia) 04/17/2015  . TIA (transient ischemic attack) 08/23/2013  . Other and unspecified angina pectoris 07/04/2013  . Nontoxic multinodular goiter 10/07/2012  . Unstable angina ([place]) 06/19/2012  . Carotid stenosis   . PAD (peripheral artery disease) ([place])   . Left inguinal hernia 05/30/2012  . COPD (chronic obstructive pulmonary disease) ([place]) 08/24/2011  . S/P AAA repair 08/24/2011  . Lumbar disc disease   . Hypertensive heart disease   . Hyperlipidemia   . CAD (coronary artery disease)   . History of pontine CVA   . GERD (gastroesophageal reflux disease)      Prior to Admission medications   Medication Sig Start Date End Date Taking? Authorizing Provider  aspirin EC 81 MG tablet Take 81 mg by mouth every morning.    Yes Historical Provider, MD  B Complex-Folic Acid (B COMPLEX-VITAMIN  B12) TABS Take 1 tablet by mouth daily. 11/26/13  Yes Barton Fanny, MD  clopidogrel (PLAVIX) 75 MG tablet Take 1 tablet (75 mg total) by mouth daily with breakfast. 09/23/13  Yes Bernadene Bell, MD  furosemide (LASIX) 20 MG tablet Take 1 tablet (20 mg total) by mouth daily. 09/20/14  Yes Nicole Pisciotta, PA-C  gabapentin (NEURONTIN) 100 MG capsule TAKE 1 CAPSULE BY MOUTH EACH MORNING AND2 CAPSULES AT BEDTIME AS DIRECTED 02/21/14  out Barton Fanny, MD  HYDROcodone-acetaminophen [facility]) 10-325 MG tablet Take 1 tablet by mouth every 4 (four) hours as needed for moderate pain. 04/17/15  Yes Albertine Patricia, MD  isosorbide mononitrate (IMDUR) 120 MG 24 hr tablet Take 1 tablet (120 mg total) by mouth daily. 07/27/14  Yes Minus Breeding, MD  levETIRAcetam (KEPPRA) 500 MG tablet Take 1 tablet (500 mg total) by mouth 2 (two) times daily. 03/22/15  Yes Wardell Honour, MD  metoprolol tartrate (LOPRESSOR) 25 MG tablet Take 0.5 tablets (12.5 mg total) by mouth 2 (two) times daily. 08/10/14  Yes Minus Breeding, MD  morphine (MS CONTIN) 30 MG 12 hr tablet Take 1 tablet (30 mg total) by mouth every 12 (twelve) hours. 04/17/15  Yes Albertine Patricia, MD  nitroGLYCERIN (NITROSTAT) 0.4 MG SL tablet Place 0.4 mg under the tongue every 5 (five) minutes as needed for chest pain.  06/01/13  Yes Minus Breeding, MD  ranolazine (RANEXA) 1000 MG SR tablet Take 1 tablet (1,000 mg total)  by mouth 2 (two) times daily. 08/06/14  Yes Minus Breeding, MD  rosuvastatin (CRESTOR) 40 MG tablet Take 1 tablet (40 mg total) by mouth daily. 02/15/15  Yes Minus Breeding, MD  tamsulosin (FLOMAX) 0.4 MG CAPS capsule Take 0.4 mg by mouth at bedtime.    Yes Historical Provider, MD     No Known Allergies     Objective:  Physical Exam  Constitutional: He is oriented to person, place, and time. He appears well-developed and well-nourished. He is active and cooperative. No distress.  BP 100/68 mmHg  Pulse 83  Temp(Src) 98.3 F  (36.8 C) (Oral)  Resp 20  Wt 149 lb 12.8 oz (67.949 kg)  SpO2 94% Seated in a wheelchair wearing pajama pants, a sweatshirt, ball cap and slippers over hospital socks  HENT:  Head: Normocephalic and atraumatic.  Right Ear: Hearing normal.  Left Ear: Hearing normal.  Eyes: Conjunctivae are normal. No scleral icterus.  Neck: Normal range of motion. Neck supple. No thyromegaly present.  Cardiovascular: Normal rate, regular rhythm and normal heart sounds.   Pulses:      Radial pulses are 2+ on the right side, and 2+ on the left side.  1+ bilateral LE edema to the mid tibias, which he reports is baseline  Pulmonary/Chest: Effort normal and breath sounds normal.  Lymphadenopathy:       Head (right side): No tonsillar, no preauricular, no posterior auricular and no occipital adenopathy present.       Head (left side): No tonsillar, no preauricular, no posterior auricular and no occipital adenopathy present.    He has no cervical adenopathy.       Right: No supraclavicular adenopathy present.       Left: No supraclavicular adenopathy present.  Neurological: He is alert and oriented to person, place, and time. No sensory deficit.  Skin: Skin is warm, dry and intact. No rash noted. No cyanosis or erythema. Nails show no clubbing.  Psychiatric: He has a normal mood and affect. His speech is normal and behavior is normal.           Assessment & Plan:   1. Lumbar disc disease 1 week supply of pain medications until PCP is back in the office and we can clarify with the pain management office in [place] what the situation is. Explained that is he has been dismissed, we may have considerable difficulty getting him in elsewhere. - morphine (MS CONTIN) 30 MG 12 hr tablet; Take 1 tablet (30 mg total) by mouth every 12 (twelve) hours.  Dispense: 14 tablet; Refill: 0 - HYDROcodone-acetaminophen (NORCO) 10-325 MG tablet; Take 1 tablet by mouth every 4 (four) hours as needed for severe pain.   Dispense: 40 tablet; Refill: 0  2. Need for pneumococcal vaccination - Pneumococcal conjugate vaccine 13-valent IM   Fara Chute, PA-C Physician Assistant-Certified [facility]

## 2015-04-25 LAB — METHYLMALONIC ACID, SERUM: Methylmalonic Acid, Quantitative: 194 nmol/L (ref 0–378)

## 2015-04-26 ENCOUNTER — Other Ambulatory Visit: Payer: Self-pay | Admitting: *Deleted

## 2015-04-26 ENCOUNTER — Encounter: Payer: Self-pay | Admitting: *Deleted

## 2015-04-26 ENCOUNTER — Telehealth: Payer: Self-pay

## 2015-04-26 DIAGNOSIS — N400 Enlarged prostate without lower urinary tract symptoms: Secondary | ICD-10-CM | POA: Diagnosis not present

## 2015-04-26 DIAGNOSIS — I2510 Atherosclerotic heart disease of native coronary artery without angina pectoris: Secondary | ICD-10-CM | POA: Diagnosis not present

## 2015-04-26 DIAGNOSIS — J449 Chronic obstructive pulmonary disease, unspecified: Secondary | ICD-10-CM | POA: Diagnosis not present

## 2015-04-26 DIAGNOSIS — E785 Hyperlipidemia, unspecified: Secondary | ICD-10-CM | POA: Diagnosis not present

## 2015-04-26 DIAGNOSIS — Z8673 Personal history of transient ischemic attack (TIA), and cerebral infarction without residual deficits: Secondary | ICD-10-CM | POA: Diagnosis not present

## 2015-04-26 DIAGNOSIS — I10 Essential (primary) hypertension: Secondary | ICD-10-CM | POA: Diagnosis not present

## 2015-04-26 DIAGNOSIS — I739 Peripheral vascular disease, unspecified: Secondary | ICD-10-CM | POA: Diagnosis not present

## 2015-04-26 NOTE — Patient Outreach (Signed)
[facility]) Care Management  04/26/2015  SALIM ALONGE 04/17/1951 [id number]   Note pt missed the last scheduled home visit as RN awaiting a call back to reschedule however unsuccessful. RN has attempted several time to contact this pt telephonically again unusucessful. Will sent outreach letter in hopes of reaching this pt for ongoing case management services. Rn notified provider's office for additional contact numbers none available. Also inquired on next office visit for possible visit on this pt however coincides with another appointment on the scheduled day. Informed office representative to inform pt to contact this RN case manager for ongoing services. Note this provider has changed office locations and the pt remains active with this same provider. Will alert administrative staff of new office and fax for this provider.  Raina Mina, RN Care Management Coordinator [place] [facility] Main Office [phone]

## 2015-04-26 NOTE — Telephone Encounter (Signed)
Chris Payne his [facility] Nurse has been reaching out to him to see if he needs continue care about his health. The number [phone]  which is on file, is no longer in service. Please let him know that she has been trying to reach out to him.  Please advise Chris Payne  [phone]

## 2015-04-29 DIAGNOSIS — J449 Chronic obstructive pulmonary disease, unspecified: Secondary | ICD-10-CM | POA: Diagnosis not present

## 2015-04-29 DIAGNOSIS — Z8673 Personal history of transient ischemic attack (TIA), and cerebral infarction without residual deficits: Secondary | ICD-10-CM | POA: Diagnosis not present

## 2015-04-29 DIAGNOSIS — I739 Peripheral vascular disease, unspecified: Secondary | ICD-10-CM | POA: Diagnosis not present

## 2015-04-29 DIAGNOSIS — I10 Essential (primary) hypertension: Secondary | ICD-10-CM | POA: Diagnosis not present

## 2015-04-29 DIAGNOSIS — I2510 Atherosclerotic heart disease of native coronary artery without angina pectoris: Secondary | ICD-10-CM | POA: Diagnosis not present

## 2015-04-29 DIAGNOSIS — E785 Hyperlipidemia, unspecified: Secondary | ICD-10-CM | POA: Diagnosis not present

## 2015-04-29 DIAGNOSIS — N400 Enlarged prostate without lower urinary tract symptoms: Secondary | ICD-10-CM | POA: Diagnosis not present

## 2015-04-29 NOTE — Telephone Encounter (Signed)
Spoke with pt, advised message from [facility]. He stated his phone did not have any minutes on it last week. I called Lattie Haw and left a voicemail letting her know to call him.

## 2015-05-01 ENCOUNTER — Other Ambulatory Visit: Payer: Self-pay | Admitting: *Deleted

## 2015-05-01 ENCOUNTER — Encounter: Payer: Self-pay | Admitting: *Deleted

## 2015-05-01 DIAGNOSIS — I739 Peripheral vascular disease, unspecified: Secondary | ICD-10-CM | POA: Diagnosis not present

## 2015-05-01 DIAGNOSIS — E785 Hyperlipidemia, unspecified: Secondary | ICD-10-CM | POA: Diagnosis not present

## 2015-05-01 DIAGNOSIS — J449 Chronic obstructive pulmonary disease, unspecified: Secondary | ICD-10-CM | POA: Diagnosis not present

## 2015-05-01 DIAGNOSIS — I10 Essential (primary) hypertension: Secondary | ICD-10-CM | POA: Diagnosis not present

## 2015-05-01 DIAGNOSIS — I509 Heart failure, unspecified: Secondary | ICD-10-CM

## 2015-05-01 DIAGNOSIS — N400 Enlarged prostate without lower urinary tract symptoms: Secondary | ICD-10-CM | POA: Diagnosis not present

## 2015-05-01 DIAGNOSIS — Z8673 Personal history of transient ischemic attack (TIA), and cerebral infarction without residual deficits: Secondary | ICD-10-CM | POA: Diagnosis not present

## 2015-05-01 DIAGNOSIS — I2510 Atherosclerotic heart disease of native coronary artery without angina pectoris: Secondary | ICD-10-CM | POA: Diagnosis not present

## 2015-05-01 NOTE — Patient Outreach (Addendum)
[facility]) Care Management  05/01/2015  DERL ABALOS 05/06/1950 [id number]  RN able to speak with pt today and reintroduce RN and [facility] services due to the laspe in community home visits due to other scheduled appointments. Pt states he is elevating his legs and the swelling has reduced. Pt confirmed poor circulation that has caused his swelling but no issues other then some exhaustion when ambulating to much. Pt feels he is doing well and has opt to decline ongoing home visits for [facility] services. Pt receptive when offered a health coach for telephonic follow ups. RN discussed pt's goals and plan of care with update on all changes and ongoing involvement with the health coach. Education completed for HF and COPD teachings began along with the COPD action plan to alert pt on his awareness on what to do if acute symptoms should occur. Case will be closed via community case management and referred to health coach for disease management services. Note SW [employer] updated and will also close pt from services with goals met for the initial referral.  Raina Mina, RN Care Management Coordinator [place] Office [phone]

## 2015-05-01 NOTE — Patient Outreach (Addendum)
[facility]) Care Management  05/01/2015  Chris Payne 1951/03/19 [id number]   Phone call to patient to confirm that he received the community resources provided related to private duty in home aids and general housekeeping, per RNCM's request.  Per patient, he received the resources and has contacted Toney Reil to discuss services offered.  Patient verbalized having no further social work needs at this time.  Plan: Patient's case to be closed to social work at this time           [facility] notified of resources provided and case closure.   [name] [facility] Care Management [phone]

## 2015-05-03 DIAGNOSIS — N400 Enlarged prostate without lower urinary tract symptoms: Secondary | ICD-10-CM | POA: Diagnosis not present

## 2015-05-03 DIAGNOSIS — J449 Chronic obstructive pulmonary disease, unspecified: Secondary | ICD-10-CM | POA: Diagnosis not present

## 2015-05-03 DIAGNOSIS — E785 Hyperlipidemia, unspecified: Secondary | ICD-10-CM | POA: Diagnosis not present

## 2015-05-03 DIAGNOSIS — Z8673 Personal history of transient ischemic attack (TIA), and cerebral infarction without residual deficits: Secondary | ICD-10-CM | POA: Diagnosis not present

## 2015-05-03 DIAGNOSIS — I739 Peripheral vascular disease, unspecified: Secondary | ICD-10-CM | POA: Diagnosis not present

## 2015-05-03 DIAGNOSIS — I2510 Atherosclerotic heart disease of native coronary artery without angina pectoris: Secondary | ICD-10-CM | POA: Diagnosis not present

## 2015-05-03 DIAGNOSIS — I10 Essential (primary) hypertension: Secondary | ICD-10-CM | POA: Diagnosis not present

## 2015-05-08 DIAGNOSIS — J449 Chronic obstructive pulmonary disease, unspecified: Secondary | ICD-10-CM | POA: Diagnosis not present

## 2015-05-08 DIAGNOSIS — I10 Essential (primary) hypertension: Secondary | ICD-10-CM | POA: Diagnosis not present

## 2015-05-08 DIAGNOSIS — E785 Hyperlipidemia, unspecified: Secondary | ICD-10-CM | POA: Diagnosis not present

## 2015-05-08 DIAGNOSIS — Z8673 Personal history of transient ischemic attack (TIA), and cerebral infarction without residual deficits: Secondary | ICD-10-CM | POA: Diagnosis not present

## 2015-05-08 DIAGNOSIS — I739 Peripheral vascular disease, unspecified: Secondary | ICD-10-CM | POA: Diagnosis not present

## 2015-05-08 DIAGNOSIS — N400 Enlarged prostate without lower urinary tract symptoms: Secondary | ICD-10-CM | POA: Diagnosis not present

## 2015-05-08 DIAGNOSIS — I2510 Atherosclerotic heart disease of native coronary artery without angina pectoris: Secondary | ICD-10-CM | POA: Diagnosis not present

## 2015-05-10 ENCOUNTER — Ambulatory Visit: Payer: Medicare Other | Admitting: Family Medicine

## 2015-05-21 ENCOUNTER — Ambulatory Visit (INDEPENDENT_AMBULATORY_CARE_PROVIDER_SITE_OTHER): Payer: Medicare Other | Admitting: Family Medicine

## 2015-05-21 ENCOUNTER — Encounter: Payer: Self-pay | Admitting: Family Medicine

## 2015-05-21 VITALS — BP 110/76 | HR 84 | Temp 98.4°F | Resp 18 | Ht 67.0 in | Wt 152.0 lb

## 2015-05-21 DIAGNOSIS — G894 Chronic pain syndrome: Secondary | ICD-10-CM

## 2015-05-21 DIAGNOSIS — H9313 Tinnitus, bilateral: Secondary | ICD-10-CM | POA: Diagnosis not present

## 2015-05-21 DIAGNOSIS — M519 Unspecified thoracic, thoracolumbar and lumbosacral intervertebral disc disorder: Secondary | ICD-10-CM

## 2015-05-21 MED ORDER — MORPHINE SULFATE ER 30 MG PO TBCR: 30.0000 mg | EXTENDED_RELEASE_TABLET | Freq: Two times a day (BID) | ORAL | Status: DC

## 2015-05-21 MED ORDER — HYDROCODONE-ACETAMINOPHEN 10-325 MG PO TABS: 1.0000 | ORAL_TABLET | ORAL | Status: DC | PRN

## 2015-05-21 NOTE — Progress Notes (Signed)
Subjective:    Patient ID: Chris Payne, male    DOB: 11-Jul-1950, 65 y.o.   MRN: [id number]  HPI This is a pleasant 65 yo male who presents today refill of pain medications. He has chronic lumbar disc disease. He also complains of intermittent ringing in his ears x several months. The ringing comes every couple of days and can last for a day or more.  Sleeps poorly.   He is seen at the [facility] clinic for periodic injections. He was told about two months ago that they would not refill his narcotic pain medication because he ran out too quickly. He has an appointment to establish with Dr. Tamala Julian on 05/24/15. He is requesting enough medication to last until then. He was seen 04/24/15 by Harrison Mons, PA-C and was given MS contin 14 tablets and Norco 10-325 40 tablets. He keeps his medication locked at home.   His wife is currently in the hospital with "a mild heart attack."   Past Medical History  Diagnosis Date  . Hypertension   . Stroke [facility])     h/o pontine CVA  . AAA (abdominal aortic aneurysm) ([place])     s/p repair 6/11  . Hyperlipidemia   . CAD (coronary artery disease)     s/p CABG 2/12:  LIMA to LAD, SVG to diagonal-1, SVG to ramus intermedius,, SVG to AM (Dr. Roxan Hockey) ;  b.  Myoview 10/13:  inf infarct with very mild peri-infarct ischemia, EF 49%, inf HK; c  He has severe three-vessel native coronary artery disease. Catheterization March 2015 as above  . Carotid stenosis     a.  dopplers 0000000: LICA 0000000;  b. Carotid dopplers 3/14:  R 0-39%, L 60-79%, repeat 6 mos  . PAD (peripheral artery disease) ([place])     ABIs 4/12:  R 0.88, L 0.92; R SFA 40%, L CFA 50%, L SFA 50-60%  . COPD (chronic obstructive pulmonary disease) ([place])   . Inguinal hernia   . Thyroid nodule     incidental finding on carotid doppler 3/14 => thyroid U/S ordered  . Pneumonia     hx  . GERD (gastroesophageal reflux disease)   . Myocardial infarction [facility])     PCI x3  . Headache(784.0)   .  CAP (community acquired pneumonia) 02/23/2014  . Hypoxia 02/23/2014   Past Surgical History  Procedure Laterality Date  . Hip surgery  40 years ago    left hip bone removal and pinning  . Abdominal aortic aneurysm repair  10-03-2009  . Exploratory laparotomy  09/2009    ligation of lumbar arteries  . Coronary artery bypass graft  06/24/2010    LIMA to the LAD, SVG to first diagonal, SVG to ramus intermediate, SVG to acute marginal. EF 50%. 2/12  . External ear surgery Left     laceration child  . Inguinal hernia repair Left 07/21/2012    Procedure: HERNIA REPAIR INGUINAL ADULT;  Surgeon: Merrie Roof, MD;  Location: [place];  Service: General;  Laterality: Left;  . Insertion of mesh Left 07/21/2012    Procedure: INSERTION OF MESH;  Surgeon: Merrie Roof, MD;  Location: [place];  Service: General;  Laterality: Left;  . Exploration post operative open heart    . Nm myocar perf wall motion  12/02/2010    inferoapical defect is fixed c/w prior infarction/scar, there is minimal borderzone ischemia.  . Left heart catheterization with coronary/graft angiogram  06/21/2012    Procedure:  LEFT HEART CATHETERIZATION WITH Beatrix Fetters;  Surgeon: Minus Breeding, MD;  Location: [facility] CATH LAB;  Service: Cardiovascular;;  . Left heart catheterization with coronary/graft angiogram N/A 07/04/2013    Procedure: LEFT HEART CATHETERIZATION WITH Beatrix Fetters;  Surgeon: Sinclair Grooms, MD;  Location: [facility] CATH LAB;  Service: Cardiovascular;  Laterality: N/A;   Family History  Problem Relation Age of Onset  . Alcohol abuse Father   . Cancer Sister    Social History  Substance Use Topics  . Smoking status: Former Smoker -- 1.00 packs/day for 30 years    Types: Cigarettes    Quit date: 01/25/2010  . Smokeless tobacco: Never Used  . Alcohol Use: No     Comment: former drinker - Quit 2011.      Review of Systems  HENT: Positive for tinnitus (bilateral, intermittent).   Respiratory:  Positive for shortness of breath (at baseline). Negative for cough.   Cardiovascular: Positive for leg swelling (intermtittent, goes down at night, worse with walking.). Negative for chest pain.  Gastrointestinal: Negative for nausea, vomiting, abdominal pain, diarrhea and constipation.  Musculoskeletal: Positive for back pain.       Objective:   Physical Exam  Constitutional: He is oriented to person, place, and time. He appears well-developed and well-nourished. No distress.  Appears older than stated age.   HENT:  Head: Normocephalic and atraumatic.  Right Ear: External ear normal.  Left Ear: External ear normal.  Mouth/Throat: Abnormal dentition (endentulous).  Bilateral canals occluded with cerumen. Irrigated with good results.   Cardiovascular: Normal rate, regular rhythm and normal heart sounds.   Pulmonary/Chest: Effort normal.  Decreased breath sounds posterior bases.   Neurological: He is alert and oriented to person, place, and time.  Skin: Skin is warm and dry. He is not diaphoretic.  Psychiatric: He has a normal mood and affect. His behavior is normal. Judgment and thought content normal.  Vitals reviewed.     BP 110/76 mmHg  Pulse 84  Temp(Src) 98.4 F (36.9 C) (Oral)  Resp 18  Ht 5\' 7"  (1.702 m)  Wt 152 lb (68.947 kg)  BMI 23.80 kg/m2  SpO2 95% Wt Readings from Last 3 Encounters:  05/21/15 152 lb (68.947 kg)  04/24/15 149 lb 12.8 oz (67.949 kg)  04/17/15 158 lb 4.6 oz (71.8 kg)       Assessment & Plan:  Patient has an appointment to establish care with Dr. Tamala Julian on 05/24/15, I stressed that he keep this appointment, I am just giving him enough medicine to get him to his appointment.   1. Lumbar disc disease - morphine (MS CONTIN) 30 MG 12 hr tablet; Take 1 tablet (30 mg total) by mouth every 12 (twelve) hours.  Dispense: 10 tablet; Refill: 0 - HYDROcodone-acetaminophen (NORCO) 10-325 MG tablet; Take 1 tablet by mouth every 4 (four) hours as needed for  severe pain.  Dispense: 10 tablet; Refill: 0  2. Chronic pain syndrome - morphine (MS CONTIN) 30 MG 12 hr tablet; Take 1 tablet (30 mg total) by mouth every 12 (twelve) hours.  Dispense: 10 tablet; Refill: 0 - HYDROcodone-acetaminophen (NORCO) 10-325 MG tablet; Take 1 tablet by mouth every 4 (four) hours as needed for severe pain.  Dispense: 10 tablet; Refill: 0  3. Tinnitus, bilateral - patient reports improvement following irrigation   Clarene Reamer, FNP-BC  [facility] [place] [facility]  05/23/2015 10:10 PM

## 2015-05-21 NOTE — Patient Instructions (Signed)
It is imperative that you keep your appointment with Dr. Tamala Julian

## 2015-05-22 ENCOUNTER — Other Ambulatory Visit: Payer: Self-pay | Admitting: *Deleted

## 2015-05-22 NOTE — Patient Outreach (Signed)
[place] [facility]) Care Management  05/22/2015  [name] 05/12/1950 [id number]   RN Health Coach introduction  telephone call to patient.  [employer] compliance verified. Patient referred  from community RN. Patient is very agreeable to services. Informed patient I will call back in 3 weeks.  [place] Care Management [phone]

## 2015-05-23 ENCOUNTER — Other Ambulatory Visit: Payer: Self-pay | Admitting: Cardiology

## 2015-05-23 DIAGNOSIS — Z8679 Personal history of other diseases of the circulatory system: Secondary | ICD-10-CM

## 2015-05-23 DIAGNOSIS — I6523 Occlusion and stenosis of bilateral carotid arteries: Secondary | ICD-10-CM

## 2015-05-23 DIAGNOSIS — Z9889 Other specified postprocedural states: Principal | ICD-10-CM

## 2015-05-24 ENCOUNTER — Encounter: Payer: Self-pay | Admitting: Family Medicine

## 2015-05-24 ENCOUNTER — Ambulatory Visit (INDEPENDENT_AMBULATORY_CARE_PROVIDER_SITE_OTHER): Payer: Medicare Other | Admitting: Family Medicine

## 2015-05-24 VITALS — BP 118/72 | HR 81 | Temp 97.2°F | Resp 16 | Wt 153.0 lb

## 2015-05-24 DIAGNOSIS — Z8673 Personal history of transient ischemic attack (TIA), and cerebral infarction without residual deficits: Secondary | ICD-10-CM | POA: Diagnosis not present

## 2015-05-24 DIAGNOSIS — Z9889 Other specified postprocedural states: Secondary | ICD-10-CM | POA: Diagnosis not present

## 2015-05-24 DIAGNOSIS — J438 Other emphysema: Secondary | ICD-10-CM | POA: Diagnosis not present

## 2015-05-24 DIAGNOSIS — E785 Hyperlipidemia, unspecified: Secondary | ICD-10-CM | POA: Diagnosis not present

## 2015-05-24 DIAGNOSIS — I2510 Atherosclerotic heart disease of native coronary artery without angina pectoris: Secondary | ICD-10-CM

## 2015-05-24 DIAGNOSIS — D751 Secondary polycythemia: Secondary | ICD-10-CM | POA: Diagnosis not present

## 2015-05-24 DIAGNOSIS — E042 Nontoxic multinodular goiter: Secondary | ICD-10-CM

## 2015-05-24 DIAGNOSIS — G894 Chronic pain syndrome: Secondary | ICD-10-CM

## 2015-05-24 DIAGNOSIS — N400 Enlarged prostate without lower urinary tract symptoms: Secondary | ICD-10-CM

## 2015-05-24 DIAGNOSIS — G459 Transient cerebral ischemic attack, unspecified: Secondary | ICD-10-CM | POA: Diagnosis not present

## 2015-05-24 DIAGNOSIS — M519 Unspecified thoracic, thoracolumbar and lumbosacral intervertebral disc disorder: Secondary | ICD-10-CM

## 2015-05-24 DIAGNOSIS — I739 Peripheral vascular disease, unspecified: Secondary | ICD-10-CM

## 2015-05-24 DIAGNOSIS — R739 Hyperglycemia, unspecified: Secondary | ICD-10-CM

## 2015-05-24 DIAGNOSIS — Z8679 Personal history of other diseases of the circulatory system: Secondary | ICD-10-CM

## 2015-05-24 DIAGNOSIS — G40909 Epilepsy, unspecified, not intractable, without status epilepticus: Secondary | ICD-10-CM

## 2015-05-24 DIAGNOSIS — K219 Gastro-esophageal reflux disease without esophagitis: Secondary | ICD-10-CM

## 2015-05-24 LAB — CBC WITH DIFFERENTIAL/PLATELET
Basophils Absolute: 0 10*3/uL (ref 0.0–0.1)
Basophils Relative: 0 % (ref 0–1)
EOS PCT: 1 % (ref 0–5)
Eosinophils Absolute: 0 10*3/uL (ref 0.0–0.7)
HEMATOCRIT: 50.2 % (ref 39.0–52.0)
HEMOGLOBIN: 17.3 g/dL — AB (ref 13.0–17.0)
LYMPHS ABS: 1.6 10*3/uL (ref 0.7–4.0)
LYMPHS PCT: 34 % (ref 12–46)
MCH: 35.2 pg — AB (ref 26.0–34.0)
MCHC: 34.5 g/dL (ref 30.0–36.0)
MCV: 102.2 fL — AB (ref 78.0–100.0)
MONO ABS: 0.4 10*3/uL (ref 0.1–1.0)
MONOS PCT: 9 % (ref 3–12)
MPV: 9.2 fL (ref 8.6–12.4)
NEUTROS ABS: 2.6 10*3/uL (ref 1.7–7.7)
Neutrophils Relative %: 56 % (ref 43–77)
Platelets: 149 10*3/uL — ABNORMAL LOW (ref 150–400)
RBC: 4.91 MIL/uL (ref 4.22–5.81)
RDW: 13.1 % (ref 11.5–15.5)
WBC: 4.6 10*3/uL (ref 4.0–10.5)

## 2015-05-24 LAB — LIPID PANEL
CHOL/HDL RATIO: 2.6 ratio (ref <=5.0)
Cholesterol: 206 mg/dL — ABNORMAL HIGH (ref 125–200)
HDL: 80 mg/dL (ref >=40)
LDL CALC: 102 mg/dL (ref <130)
TRIGLYCERIDES: 120 mg/dL (ref <150)
VLDL: 24 mg/dL (ref <30)

## 2015-05-24 LAB — COMPREHENSIVE METABOLIC PANEL
ALBUMIN: 4.1 g/dL (ref 3.6–5.1)
ALT: 22 U/L (ref 9–46)
AST: 20 U/L (ref 10–35)
Alkaline Phosphatase: 64 U/L (ref 40–115)
BILIRUBIN TOTAL: 0.8 mg/dL (ref 0.2–1.2)
BUN: 13 mg/dL (ref 7–25)
CALCIUM: 9.5 mg/dL (ref 8.6–10.3)
CHLORIDE: 101 mmol/L (ref 98–110)
CO2: 29 mmol/L (ref 20–31)
Creat: 0.94 mg/dL (ref 0.70–1.25)
GLUCOSE: 81 mg/dL (ref 65–99)
POTASSIUM: 4.7 mmol/L (ref 3.5–5.3)
Sodium: 139 mmol/L (ref 135–146)
Total Protein: 7.1 g/dL (ref 6.1–8.1)

## 2015-05-24 MED ORDER — MORPHINE SULFATE ER 30 MG PO TBCR: 30.0000 mg | EXTENDED_RELEASE_TABLET | Freq: Two times a day (BID) | ORAL | Status: DC

## 2015-05-24 MED ORDER — HYDROCODONE-ACETAMINOPHEN 10-325 MG PO TABS: 1.0000 | ORAL_TABLET | Freq: Two times a day (BID) | ORAL | Status: DC | PRN

## 2015-05-24 MED ORDER — GABAPENTIN 100 MG PO CAPS: 100.0000 mg | ORAL_CAPSULE | Freq: Three times a day (TID) | ORAL | Status: DC

## 2015-05-24 NOTE — Patient Instructions (Signed)
Back provider: Dr. Elson Clan

## 2015-05-24 NOTE — Progress Notes (Signed)
Subjective:    Patient ID: Chris Payne, male    DOB: [birth year], 65 y.o.   MRN: [id number]  05/24/2015  Medication Refill; Leg Swelling; and Establish Care   HPI This 65 y.o. male presents for to establish care.  Last physical:  Not sure Colonoscopy:  Never; agreeable TDAP:  Not sure Pneumovax:  2013, Prevnar 2016 Zostavax:  never Influenza:   2016 Eye exam:   [address]; last visit 3 years ago. Dental exam:  dentures   CAD/CABG/AAA/CHF:  Followed every two and three weeks.  Due for ABIs; had transportation issues and had to reschedule it.  Maintained ib ASA/Plavix/Imdur/Metoprolol/Ranexa/NTG.  Last NTG 3-4 months ago  Hyperlipidimia:  Patient reports good compliance with medication, good tolerance to medication, and good symptom control.  Taking Lipitor instead of Crestor and fenofibrate.  Constipation; Colace bid due to chronic pain medications.  BPH:  Maintained on Finasteride and Flomax.  No urologist; last urologist in [place].    Seizures:  Suffered with seizures last year; presented to [facility]; no recurrent seizure; not associated with CVA.  CVA was in 2010.  No neurologist.    DDD lumbar: followed by pain specialist in [place]; scheduled for injection on 05/31/15.  Injections 2-3 days.  Feel great for 3 days.  [place] pain management got irritated because patient took too many pills; completed rx five days early.  Has not been kicked out of pain management.  Out now.  Cannot get refill until not said.   Deconditioned: s/p physical therapy; also seeing a nurse for COPD.  COPD?:  Review of Systems  Constitutional: Negative for fever, chills, diaphoresis, activity change, appetite change and fatigue.  Respiratory: Negative for cough and shortness of breath.   Cardiovascular: Negative for chest pain, palpitations and leg swelling.  Gastrointestinal: Negative for nausea, vomiting, abdominal pain and diarrhea.  Endocrine: Negative for cold  intolerance, heat intolerance, polydipsia, polyphagia and polyuria.  Skin: Negative for color change, rash and wound.  Neurological: Negative for dizziness, tremors, seizures, syncope, facial asymmetry, speech difficulty, weakness, light-headedness, numbness and headaches.  Psychiatric/Behavioral: Negative for sleep disturbance and dysphoric mood. The patient is not nervous/anxious.     Past Medical History  Diagnosis Date  . Hypertension   . Stroke [facility])     h/o pontine CVA  . AAA (abdominal aortic aneurysm) ([place])     s/p repair 6/11  . Hyperlipidemia   . CAD (coronary artery disease)     s/p CABG 2/12:  LIMA to LAD, SVG to diagonal-1, SVG to ramus intermedius,, SVG to AM (Dr. Roxan Hockey) ;  b.  Myoview 10/13:  inf infarct with very mild peri-infarct ischemia, EF 49%, inf HK; c  He has severe three-vessel native coronary artery disease. Catheterization March 2015 as above  . Carotid stenosis     a.  dopplers 0000000: LICA 0000000;  b. Carotid dopplers 3/14:  R 0-39%, L 60-79%, repeat 6 mos  . PAD (peripheral artery disease) ([place])     ABIs 4/12:  R 0.88, L 0.92; R SFA 40%, L CFA 50%, L SFA 50-60%  . COPD (chronic obstructive pulmonary disease) ([place])   . Inguinal hernia   . Thyroid nodule     incidental finding on carotid doppler 3/14 => thyroid U/S ordered  . Pneumonia     hx  . GERD (gastroesophageal reflux disease)   . Myocardial infarction [facility])     PCI x3  . Headache(784.0)   . CAP (community acquired pneumonia) 02/23/2014  .  Hypoxia 02/23/2014   Past Surgical History  Procedure Laterality Date  . Hip surgery  40 years ago    left hip bone removal and pinning  . Abdominal aortic aneurysm repair  10-03-2009  . Exploratory laparotomy  09/2009    ligation of lumbar arteries  . Coronary artery bypass graft  06/24/2010    LIMA to the LAD, SVG to first diagonal, SVG to ramus intermediate, SVG to acute marginal. EF 50%. 2/12  . External ear surgery Left     laceration child  .  Inguinal hernia repair Left 07/21/2012    Procedure: HERNIA REPAIR INGUINAL ADULT;  Surgeon: Merrie Roof, MD;  Location: [place];  Service: General;  Laterality: Left;  . Insertion of mesh Left 07/21/2012    Procedure: INSERTION OF MESH;  Surgeon: Merrie Roof, MD;  Location: [place];  Service: General;  Laterality: Left;  . Exploration post operative open heart    . Nm myocar perf wall motion  12/02/2010    inferoapical defect is fixed c/w prior infarction/scar, there is minimal borderzone ischemia.  . Left heart catheterization with coronary/graft angiogram  06/21/2012    Procedure: LEFT HEART CATHETERIZATION WITH Beatrix Fetters;  Surgeon: Minus Breeding, MD;  Location[facility] C[facility] LAB;  Service: Cardiovascular;;  . Left heart catheterization with coronary/graft angiogram N/A 07/04/2013    Procedure: LEFT HEART CATHETERIZATION WITH Beatrix Fetters;  Surgeon: Sinclair Grooms, MD;  Location: [facility] CATH LAB;  Service: Cardiovascular;  Laterality: N/A;   No Known Allergies Current Outpatient Prescriptions  Medication Sig Dispense Refill  . aspirin EC 81 MG tablet Take 81 mg by mouth every morning.     Marland Kitchen atorvastatin (LIPITOR) 80 MG tablet Take by mouth.    Marland Kitchen b complex vitamins tablet Take by mouth.    . clopidogrel (PLAVIX) 75 MG tablet Take 1 tablet (75 mg total) by mouth daily with breakfast. 30 tablet 0  . docusate sodium (COLACE) 100 MG capsule Take by mouth.    . fenofibrate 54 MG tablet Take by mouth.    . finasteride (PROSCAR) 5 MG tablet     . HYDROcodone-acetaminophen (NORCO) 10-325 MG tablet Take 1 tablet by mouth every 4 (four) hours as needed for severe pain. 10 tablet 0  . levETIRAcetam (KEPPRA) 500 MG tablet Take 1 tablet (500 mg total) by mouth 2 (two) times daily. 60 tablet 5  . morphine (MS CONTIN) 30 MG 12 hr tablet Take 1 tablet (30 mg total) by mouth every 12 (twelve) hours. 10 tablet 0  . nitroGLYCERIN (NITROSTAT) 0.4 MG SL tablet Place 0.4 mg under the tongue  every 5 (five) minutes as needed for chest pain.     . ranolazine (RANEXA) 1000 MG SR tablet Take 1 tablet (1,000 mg total) by mouth 2 (two) times daily. 60 tablet 9  . tamsulosin (FLOMAX) 0.4 MG CAPS capsule Take 0.4 mg by mouth at bedtime.      No current facility-administered medications for this visit.   Social History   Social History  . Marital Status: Married    Spouse Name: Rod Holler  . Number of Children: 0  . Years of Education: 10   Occupational History  . disabled     back pain, hernia, CAD, AAA, Stroke   Social History Main Topics  . Smoking status: Former Smoker -- 1.00 packs/day for 30 years    Types: Cigarettes    Quit date: 01/25/2010  . Smokeless tobacco: Never Used  .  Alcohol Use: No     Comment: former drinker - Quit 2011.  . Drug Use: No  . Sexual Activity: No   Other Topics Concern  . Not on file   Social History Narrative   Marital status: married x 16 years; first marriage      Children:  None; 3 stepchildren; 4 grandchildren.  No gg.      Lives: with wife, Joslyn Hy and his girlfriend.      Employment: disability since 2010 CABG, AAA, CVA.  Previous furniture work.      Tobacco:  Quit in 2009; smoked 30 years.      Alcohol: quit in 2009; weekend drinking      ADLs: uses rolling walker within the home; has four pronged walker at home; has drivers license yet no car.  Cooks; no cleaning; no yard work.    Education 10th grade.   Right handed.   Caffeine five cups of coffee daily.      Advanced Directives:  DNR/DNI.     Family History  Problem Relation Age of Onset  . Alcohol abuse Father   . Cancer Sister 22    unknown primary  . Diabetes Mother   . Hypertension Mother   . Cancer Sister 24    kidney cancer       Objective:    BP 89/66 mmHg  Pulse 81  Temp(Src) 97.2 F (36.2 C) (Oral)  Resp 16  Wt 153 lb (69.4 kg)  SpO2 91% Physical Exam  Constitutional: He is oriented to person, place, and time. He appears well-developed and  well-nourished. No distress.  HENT:  Head: Normocephalic and atraumatic.  Right Ear: External ear normal.  Left Ear: External ear normal.  Nose: Nose normal.  Mouth/Throat: Oropharynx is clear and moist.  Eyes: Conjunctivae and EOM are normal. Pupils are equal, round, and reactive to light.  Neck: Normal range of motion. Neck supple. Carotid bruit is not present. No thyromegaly present.  Cardiovascular: Normal rate, regular rhythm, normal heart sounds and intact distal pulses.  Exam reveals no gallop and no friction rub.   No murmur heard. Pulmonary/Chest: Effort normal and breath sounds normal. He has no wheezes. He has no rales.  Abdominal: Soft. Bowel sounds are normal. He exhibits no distension and no mass. There is no tenderness. There is no rebound and no guarding.  Lymphadenopathy:    He has no cervical adenopathy.  Neurological: He is alert and oriented to person, place, and time. No cranial nerve deficit.  Skin: Skin is warm and dry. No rash noted. He is not diaphoretic.  Psychiatric: He has a normal mood and affect. His behavior is normal.  Nursing note and vitals reviewed.  Results for orders placed or performed during the hospital encounter of 04/16/15  Protime-INR  Result Value Ref Range   Prothrombin Time 14.1 11.6 - 15.2 seconds   INR 1.07 0.00 - 1.49  APTT  Result Value Ref Range   aPTT 34 24 - 37 seconds  CBC  Result Value Ref Range   WBC 6.5 4.0 - 10.5 K/uL   RBC 4.81 4.22 - 5.81 MIL/uL   Hemoglobin 16.7 13.0 - 17.0 g/dL   HCT 50.7 39.0 - 52.0 %   MCV 105.4 (H) 78.0 - 100.0 fL   MCH 34.7 (H) 26.0 - 34.0 pg   MCHC 32.9 30.0 - 36.0 g/dL   RDW 12.9 11.5 - 15.5 %   Platelets 163 150 - 400 K/uL  Differential  Result Value  Ref Range   Neutrophils Relative % 55 %   Neutro Abs 3.5 1.7 - 7.7 K/uL   Lymphocytes Relative 33 %   Lymphs Abs 2.2 0.7 - 4.0 K/uL   Monocytes Relative 10 %   Monocytes Absolute 0.7 0.1 - 1.0 K/uL   Eosinophils Relative 2 %   Eosinophils  Absolute 0.1 0.0 - 0.7 K/uL   Basophils Relative 0 %   Basophils Absolute 0.0 0.0 - 0.1 K/uL  Comprehensive metabolic panel  Result Value Ref Range   Sodium 141 135 - 145 mmol/L   Potassium 4.5 3.5 - 5.1 mmol/L   Chloride 106 101 - 111 mmol/L   CO2 25 22 - 32 mmol/L   Glucose, Bld 130 (H) 65 - 99 mg/dL   BUN 12 6 - 20 mg/dL   Creatinine, Ser 0.79 0.61 - 1.24 mg/dL   Calcium 9.1 8.9 - 10.3 mg/dL   Total Protein 5.7 (L) 6.5 - 8.1 g/dL   Albumin 3.1 (L) 3.5 - 5.0 g/dL   AST 19 15 - 41 U/L   ALT 19 17 - 63 U/L   Alkaline Phosphatase 60 38 - 126 U/L   Total Bilirubin 0.9 0.3 - 1.2 mg/dL   GFR calc non Af Amer >60 >60 mL/min   GFR calc Af Amer >60 >60 mL/min   Anion gap 10 5 - 15  Urinalysis, Routine w reflex microscopic (not at [name])  Result Value Ref Range   Color, Urine AMBER (A) YELLOW   APPearance CLEAR CLEAR   Specific Gravity, Urine 1.018 1.005 - 1.030   pH 6.5 5.0 - 8.0   Glucose, UA NEGATIVE NEGATIVE mg/dL   Hgb urine dipstick NEGATIVE NEGATIVE   Bilirubin Urine SMALL (A) NEGATIVE   Ketones, ur NEGATIVE NEGATIVE mg/dL   Protein, ur NEGATIVE NEGATIVE mg/dL   Nitrite NEGATIVE NEGATIVE   Leukocytes, UA NEGATIVE NEGATIVE  Ethanol  Result Value Ref Range   Alcohol, Ethyl (B) <5 <5 mg/dL  Vitamin B12  Result Value Ref Range   Vitamin B-12 667 180 - 914 pg/mL  Methylmalonic acid, serum  Result Value Ref Range   Methylmalonic Acid, Quantitative 194 0 - 378 nmol/L  Magnesium  Result Value Ref Range   Magnesium 2.0 1.7 - 2.4 mg/dL  Folate  Result Value Ref Range   Folate 12.4 >5.9 ng/mL  C-reactive protein  Result Value Ref Range   CRP <0.5 <1.0 mg/dL  Sedimentation rate  Result Value Ref Range   Sed Rate 2 0 - 16 mm/hr  I-stat troponin, ED (not at [facility])  Result Value Ref Range   Troponin i, poc 0.00 0.00 - 0.08 ng/mL   Comment 3          CBG monitoring, ED  Result Value Ref Range   Glucose-Capillary 116 (H) 65 - 99 mg/dL  I-Stat Chem 8, ED  (not at [facility])  Result Value Ref Range   Sodium 141 135 - 145 mmol/L   Potassium 4.4 3.5 - 5.1 mmol/L   Chloride 103 101 - 111 mmol/L   BUN 15 6 - 20 mg/dL   Creatinine, Ser 0.70 0.61 - 1.24 mg/dL   Glucose, Bld 120 (H) 65 - 99 mg/dL   Calcium, Ion 1.12 (L) 1.13 - 1.30 mmol/L   TCO2 28 0 - 100 mmol/L   Hemoglobin 17.7 (H) 13.0 - 17.0 g/dL   HCT 52.0 39.0 - 52.0 %       Assessment & Plan:   1.  Gastroesophageal reflux disease without esophagitis   2. Nontoxic multinodular goiter   3. Transient cerebral ischemia, unspecified transient cerebral ischemia type   4. S/P AAA repair   5. Polycythemia   6. PAD (peripheral artery disease) ([place])   7. Hyperlipidemia   8. History of pontine CVA   9. Other emphysema ([place])   10. Coronary artery disease involving native coronary artery of native heart without angina pectoris   11. BPH (benign prostatic hyperplasia)   12. Seizure disorder ([place])   13. Hyperglycemia     Orders Placed This Encounter  Procedures  . CBC with Differential/Platelet  . Comprehensive metabolic panel    Order Specific Question:  Has the patient fasted?    Answer:  Yes  . Lipid panel    Order Specific Question:  Has the patient fasted?    Answer:  Yes  . PSA  . Hemoglobin A1c   No orders of the defined types were placed in this encounter.    No Follow-up on file.    Vishal Sandlin Elayne Guerin, M.D. [name] [address] [place], [place]  [address] [phone] phone [phone] fax

## 2015-05-25 LAB — HEMOGLOBIN A1C
Hgb A1c MFr Bld: 5.4 % (ref <5.7)
Mean Plasma Glucose: 108 mg/dL (ref <117)

## 2015-05-25 LAB — PSA: PSA: 0.84 ng/mL (ref <=4.00)

## 2015-06-03 ENCOUNTER — Other Ambulatory Visit: Payer: Self-pay | Admitting: Family Medicine

## 2015-06-03 ENCOUNTER — Encounter: Payer: Self-pay | Admitting: Family Medicine

## 2015-06-07 DIAGNOSIS — M5416 Radiculopathy, lumbar region: Secondary | ICD-10-CM | POA: Diagnosis not present

## 2015-06-12 ENCOUNTER — Other Ambulatory Visit: Payer: Self-pay | Admitting: *Deleted

## 2015-06-12 ENCOUNTER — Ambulatory Visit: Payer: Self-pay | Admitting: *Deleted

## 2015-06-12 NOTE — Patient Outreach (Signed)
[facility]) Care Management  06/12/2015  Chris Payne Nov 24, 1950 [id number]     Subjective: RN Health Coach telephone call to patient. Hipaa compliance verified. Patient stated he ambulates with a walker and a 4 prong cane. Per patient he has swelling in his feet and legs so he keeps them elevated. Per patient he is not having any shortness of breath while resting. Per patient he is taking his medications as per ordered. Per patient he is making his Dr appointments even though some times he has transportation problems since he doesn't drive since the seizures . Patient also has CHF. Per patient he would like some additional educational material on COPD. Per patient he was a smoker for over 30 years. Patient has agreed to follow up outreach calls and educational material.     Objective:   Current Medications:  Current Outpatient Prescriptions  Medication Sig Dispense Refill  . aspirin EC 81 MG tablet Take 81 mg by mouth every morning.     Marland Kitchen atorvastatin (LIPITOR) 80 MG tablet Take by mouth.    Marland Kitchen b complex vitamins tablet Take by mouth.    . docusate sodium (COLACE) 100 MG capsule Take by mouth.    Marland Kitchen HYDROcodone-acetaminophen (NORCO) 10-325 MG tablet Take 1 tablet by mouth 2 (two) times daily as needed for severe pain. 14 tablet 0  . levETIRAcetam (KEPPRA) 500 MG tablet Take 1 tablet (500 mg total) by mouth 2 (two) times daily. 60 tablet 5  . morphine (MS CONTIN) 30 MG 12 hr tablet Take 1 tablet (30 mg total) by mouth every 12 (twelve) hours. 14 tablet 0  . nitroGLYCERIN (NITROSTAT) 0.4 MG SL tablet Place 0.4 mg under the tongue every 5 (five) minutes as needed for chest pain.     . ranolazine (RANEXA) 1000 MG SR tablet Take 1 tablet (1,000 mg total) by mouth 2 (two) times daily. 60 tablet 9  . tamsulosin (FLOMAX) 0.4 MG CAPS capsule Take 0.4 mg by mouth at bedtime.     . clopidogrel (PLAVIX) 75 MG tablet Take 1 tablet (75 mg total) by mouth daily with breakfast.  (Patient not taking: Reported on 06/12/2015) 30 tablet 0  . fenofibrate 54 MG tablet Take by mouth. Reported on 06/25/2015    . finasteride (PROSCAR) 5 MG tablet Reported on 06/12/2015    . gabapentin (NEURONTIN) 100 MG capsule Take 1 capsule (100 mg total) by mouth 3 (three) times daily. (Patient not taking: Reported on 06/12/2015) 90 capsule 3  . rosuvastatin (CRESTOR) 40 MG tablet Reported on 06/25/2015     No current facility-administered medications for this visit.    Functional Status:  In your present state of health, do you have any difficulty performing the following activities: 06/12/2015 04/17/2015  Hearing? N N  Vision? Y N  Difficulty concentrating or making decisions? N Y  Walking or climbing stairs? Y Y  Dressing or bathing? Y Y  Doing errands, shopping? Y N  Preparing Food and eating ? N -  Using the Toilet? Y -  In the past six months, have you accidently leaked urine? N -  Do you have problems with loss of bowel control? N -  Managing your Medications? N -  Managing your Finances? N -  Housekeeping or managing your Housekeeping? N -    Fall/Depression Screening: PHQ 2/9 Scores 06/12/2015 05/24/2015 04/24/2015 03/22/2015 03/09/2014 11/23/2013 07/21/2013  PHQ - 2 Score 0 0 0 0 0 0 0   THN CM Care Plan  Problem One        Most Recent Value   Care Plan Problem One  Knowledge deficit in self management of COPD   Role Documenting the Problem One  [place] for Problem One  Active   THN Long Term Goal (31-90 days)  patient will not have any admissions in the hospial for COPD in the next 90 days   THN Long Term Goal Start Date  06/12/15   Interventions for Problem One Long Term Goal  RN reminded patient to keep all appointments with PCP. RN reminded patient the importance of taking medications as per physician order. RN will monitor monthly telephonically   THN CM Short Term Goal #1 (0-30 days)  Patient will be able to verbalize the zones of copd in the next 30  days   THN CM Short Term Goal #1 Start Date  06/12/15   Interventions for Short Term Goal #1  RN discussed Zones of COPD. RN will send patient EMMI information of Zones and action plans. RN willl send patient a COPD zone magnet for refrigerator   THN CM Short Term Goal #2 (0-30 days)  PPatient will be able to describe the action plan for COPD in the next 30 days   THN CM Short Term Goal #2 Start Date  06/12/15   Interventions for Short Term Goal #2  RN sent action plan for COPD zones. RN will do follow up discussion with teach back   THN CM Short Term Goal #3 (0-30 days)  Patient will be able to describe signs and symptoms of a heart attack and stroke within 30 days.   THN CM Short Term Goal #3 Start Date  06/12/15   Interventions for Short Tern Goal #3  RN discussed symptoms of heart attack and stroke in which patient didn't know. RN sent EMMI information on signs and symptoms. RN will follow up with discussion.      Assessment: Patient would benefit from [employer] outreach for education and support for COPD.    Plan: RN will send COPD large Zone and Action Plan magnet RN will send EMMI information What a heart attack and stroke is RN will send information on COPD what patients can do RN will send information on the flu, pneumonia and the common cold RN will follow up outreach within 30 days for discussion and teach back  [name], BSN, [place] [facility] RN Health Coach Phone: [place] complies with applicable Federal civil rights laws and does not discriminate on the basis of race, color, national origin, age, disability, or sex. Espaol (Spanish)  [place] cumple con las leyes federales de derechos civiles aplicables y no discrimina por motivos de raza, color, nacionalidad, edad, discapacidad o sexo.    [place] ([place])  [place] tun th? lu?t dn quy?n hi?n hnh c?a Lin bang  v khng phn bi?t ?i x? d?a trn ch?ng t?c, mu da, ngu?n g?c qu?c gia, ? tu?i, khuy?t t?t, ho?c gi?i tnh.    (Arabic)    [place]                      .

## 2015-06-28 ENCOUNTER — Encounter: Payer: Self-pay | Admitting: *Deleted

## 2015-07-01 ENCOUNTER — Ambulatory Visit: Payer: Medicare Other | Admitting: Cardiology

## 2015-07-02 DIAGNOSIS — Z79891 Long term (current) use of opiate analgesic: Secondary | ICD-10-CM | POA: Diagnosis not present

## 2015-07-02 DIAGNOSIS — M47816 Spondylosis without myelopathy or radiculopathy, lumbar region: Secondary | ICD-10-CM | POA: Diagnosis not present

## 2015-07-03 ENCOUNTER — Telehealth: Payer: Self-pay

## 2015-07-03 DIAGNOSIS — M5136 Other intervertebral disc degeneration, lumbar region: Secondary | ICD-10-CM

## 2015-07-03 DIAGNOSIS — G894 Chronic pain syndrome: Secondary | ICD-10-CM

## 2015-07-03 NOTE — Telephone Encounter (Signed)
Spoke with pt, he states he cannot get an RX for his pain meds. They told him this because Dr. Tamala Julian wrote for some pain meds 2 months ago and i guess this is a violation to his contract. Please advise.

## 2015-07-03 NOTE — Telephone Encounter (Signed)
Pt states he went to see his pain Dr and was told he couldn't get any more pain medicine for 6 months. Would like to know if his Dr would call him something in Please call [place]

## 2015-07-05 NOTE — Telephone Encounter (Signed)
Call patient --- I advised patient at his last visit that if he receives any pain medications from any doctor other than his pain management doctor he would be in violation of his pain management contract.  Does he want to be referred to another pain management doctor?  If so, where?

## 2015-07-08 NOTE — Telephone Encounter (Signed)
Spoke with patient and he would like to go to a pain management facility in [facility].  He would like for [place] to call him when we get a referral done for this

## 2015-07-09 ENCOUNTER — Ambulatory Visit (HOSPITAL_COMMUNITY)
Admission: RE | Admit: 2015-07-09 | Discharge: 2015-07-09 | Disposition: A | Discharge status: Home / Self Care | Payer: Medicare Other | Source: Ambulatory Visit | Attending: Cardiovascular Disease | Admitting: Cardiovascular Disease

## 2015-07-09 DIAGNOSIS — I2510 Atherosclerotic heart disease of native coronary artery without angina pectoris: Secondary | ICD-10-CM | POA: Insufficient documentation

## 2015-07-09 DIAGNOSIS — Z8673 Personal history of transient ischemic attack (TIA), and cerebral infarction without residual deficits: Secondary | ICD-10-CM | POA: Insufficient documentation

## 2015-07-09 DIAGNOSIS — I6523 Occlusion and stenosis of bilateral carotid arteries: Secondary | ICD-10-CM | POA: Insufficient documentation

## 2015-07-09 DIAGNOSIS — Z87891 Personal history of nicotine dependence: Secondary | ICD-10-CM | POA: Diagnosis not present

## 2015-07-09 DIAGNOSIS — E785 Hyperlipidemia, unspecified: Secondary | ICD-10-CM | POA: Diagnosis not present

## 2015-07-09 DIAGNOSIS — J449 Chronic obstructive pulmonary disease, unspecified: Secondary | ICD-10-CM | POA: Insufficient documentation

## 2015-07-09 DIAGNOSIS — Z8679 Personal history of other diseases of the circulatory system: Secondary | ICD-10-CM | POA: Insufficient documentation

## 2015-07-09 DIAGNOSIS — Z9889 Other specified postprocedural states: Principal | ICD-10-CM

## 2015-07-09 DIAGNOSIS — Z951 Presence of aortocoronary bypass graft: Secondary | ICD-10-CM | POA: Diagnosis not present

## 2015-07-09 DIAGNOSIS — I739 Peripheral vascular disease, unspecified: Secondary | ICD-10-CM | POA: Insufficient documentation

## 2015-07-10 ENCOUNTER — Other Ambulatory Visit: Payer: Self-pay | Admitting: Orthopedic Surgery

## 2015-07-10 ENCOUNTER — Ambulatory Visit: Payer: Self-pay | Admitting: *Deleted

## 2015-07-10 DIAGNOSIS — M5416 Radiculopathy, lumbar region: Secondary | ICD-10-CM

## 2015-07-11 ENCOUNTER — Ambulatory Visit: Payer: Medicare Other | Admitting: Cardiology

## 2015-07-11 ENCOUNTER — Other Ambulatory Visit: Payer: Self-pay | Admitting: *Deleted

## 2015-07-11 DIAGNOSIS — I739 Peripheral vascular disease, unspecified: Principal | ICD-10-CM

## 2015-07-11 DIAGNOSIS — I779 Disorder of arteries and arterioles, unspecified: Secondary | ICD-10-CM

## 2015-07-12 ENCOUNTER — Other Ambulatory Visit: Payer: Self-pay | Admitting: Cardiology

## 2015-07-12 NOTE — Telephone Encounter (Signed)
Rx request sent to pharmacy.  

## 2015-07-16 ENCOUNTER — Telehealth: Payer: Self-pay | Admitting: Cardiology

## 2015-07-16 ENCOUNTER — Encounter: Payer: Self-pay | Admitting: *Deleted

## 2015-07-16 ENCOUNTER — Other Ambulatory Visit: Payer: Self-pay | Admitting: *Deleted

## 2015-07-16 MED ORDER — FUROSEMIDE 20 MG PO TABS: 20.0000 mg | ORAL_TABLET | Freq: Every day | ORAL | Status: DC

## 2015-07-16 NOTE — Telephone Encounter (Signed)
Filled furosemide 20mg  daily. Contacted [facility]N case manager who had called on his behalf, LM to notify med filled & to call if questions.

## 2015-07-16 NOTE — Patient Outreach (Addendum)
[facility]) Care Management  07/16/2015  Chris Payne 03-Oct-1950 [id number]   RN Health Coach telephone call to patient.  Hipaa compliance verified.  Per patient stated that he is having some shortness of breath. He has swelling in his legs. Per patient he has gained about 3 lbs within the week. Per patient he has not taken any lasix in a while that it had been discontinued. RN Health Coach told patient to call the Dr office back . Per patient he would right away. Patient did receive all the educational  information that was sent by the RN Health coach about his COPD. He was also sent information on the signs and symptoms of a heart attack and stroke in which he stated he has reviewed and is able to teach back.. Patient was given a COPD magnet. Patient has agreed to follow up outreach calls. Assessment: Patient would continue to benefit from support and education on COPD.  Plan: RN Health Coach called cardiology office and left a message for [name] RN regarding the above information RN sent patient educational material on COPD exacerbations RN sent  patient  information about community acquired pneumonia. RN sent patient additional information on congestive heart failure RN will follow up with patient within a month for outreach.  [name], BSN, RN [facility] Care Management RN Health Coach Phone: [place] complies with applicable Federal civil rights laws and does not discriminate on the basis of race, color, national origin, age, disability, or sex. Espaol (Spanish)  [place] cumple con las leyes federales de derechos civiles aplicables y no discrimina por motivos de raza, color, nacionalidad, edad, discapacidad o sexo.    [place]  [place] tun th? lu?t dn quy?n hi?n hnh c?a Lin bang v khng phn bi?t ?i x? d?a trn ch?ng t?c, mu da, ngu?n g?c qu?c gia, ? tu?i,  khuy?t t?t, ho?c gi?i tnh.    (Arabic)    [place]                      .

## 2015-07-16 NOTE — Telephone Encounter (Signed)
I returned call. Person who answered asked me twice who I was and why I was calling. Phone hung up. Attempted to redial, no answer when dialed.   Review of chart - he had been on furosemide 20mg  daily. This was discontinued on a 05/21/15 appt w/ [facility]am [facility] but not clear why.  Pt overdue for annual OV. Will verify OK by Dr. Percival Spanish to write for furosemide, need recommendation for return visit, will reattempt call later.

## 2015-07-16 NOTE — Patient Outreach (Signed)
[place] [facility]) Care Management  07/16/2015  Chris Payne [birth year] [id number]   Dr office [name] returned call  To RN Health Coach. Dr office called patient lasix into pharmacy. Patient notified and explained to Patient that he needs to follow up with Dr next week.  [name], BSN, RN [facility] Care Management RN Health Coach Phone: [place] complies with applicable Federal civil rights laws and does not discriminate on the basis of race, color, national origin, age, disability, or sex. Espaol (Spanish)  [place] cumple con las leyes federales de derechos civiles aplicables y no discrimina por motivos de raza, color, nacionalidad, edad, discapacidad o sexo.    [place] ([place])  [place] tun th? lu?t dn quy?n hi?n hnh c?a Lin bang v khng phn bi?t ?i x? d?a trn ch?ng t?c, mu da, ngu?n g?c qu?c gia, ? tu?i, khuy?t t?t, ho?c gi?i tnh.    (Arabic)    [name]                      .

## 2015-07-16 NOTE — Telephone Encounter (Signed)
Returning your call. °

## 2015-07-16 NOTE — Telephone Encounter (Signed)
Chris Payne is calling to see whether Dr. Percival Spanish will send in a prescription for some fluid pills. His legs and feet are swollen to  [place] ([place])  . Please call if you have any questions.   Thanks

## 2015-07-19 DIAGNOSIS — M5136 Other intervertebral disc degeneration, lumbar region: Secondary | ICD-10-CM | POA: Insufficient documentation

## 2015-07-19 DIAGNOSIS — G894 Chronic pain syndrome: Secondary | ICD-10-CM | POA: Insufficient documentation

## 2015-07-19 NOTE — Telephone Encounter (Signed)
Please refer patient to pain management in [facility].

## 2015-08-01 ENCOUNTER — Other Ambulatory Visit: Payer: Medicare Other

## 2015-08-10 ENCOUNTER — Other Ambulatory Visit: Payer: Self-pay | Admitting: Cardiology

## 2015-08-12 NOTE — Telephone Encounter (Signed)
Rx(s) sent to pharmacy electronically.  

## 2015-08-15 ENCOUNTER — Other Ambulatory Visit: Payer: Self-pay | Admitting: *Deleted

## 2015-08-15 ENCOUNTER — Ambulatory Visit: Payer: Self-pay | Admitting: *Deleted

## 2015-08-15 NOTE — Patient Outreach (Signed)
[facility]) Care Management  08/15/2015  Chris Payne 1951/03/17 [id number]   RN Health Coach telephone call to patient.  Per son the patient is in the bathroom. RN health coach left a Hippa compliance message and will call patient back.  [place] [facility] [phone]

## 2015-08-16 ENCOUNTER — Ambulatory Visit
Admission: RE | Admit: 2015-08-16 | Discharge: 2015-08-16 | Disposition: A | Discharge status: Home / Self Care | Payer: Medicare Other | Source: Ambulatory Visit | Attending: Orthopedic Surgery | Admitting: Orthopedic Surgery

## 2015-08-16 DIAGNOSIS — M5126 Other intervertebral disc displacement, lumbar region: Secondary | ICD-10-CM | POA: Diagnosis not present

## 2015-08-16 DIAGNOSIS — M5416 Radiculopathy, lumbar region: Secondary | ICD-10-CM

## 2015-08-19 DIAGNOSIS — M545 Low back pain: Secondary | ICD-10-CM | POA: Diagnosis not present

## 2015-08-19 DIAGNOSIS — M129 Arthropathy, unspecified: Secondary | ICD-10-CM | POA: Diagnosis not present

## 2015-08-19 DIAGNOSIS — E559 Vitamin D deficiency, unspecified: Secondary | ICD-10-CM | POA: Diagnosis not present

## 2015-08-19 DIAGNOSIS — D539 Nutritional anemia, unspecified: Secondary | ICD-10-CM | POA: Diagnosis not present

## 2015-08-19 DIAGNOSIS — E291 Testicular hypofunction: Secondary | ICD-10-CM | POA: Diagnosis not present

## 2015-08-19 DIAGNOSIS — R5383 Other fatigue: Secondary | ICD-10-CM | POA: Diagnosis not present

## 2015-08-19 DIAGNOSIS — Z79899 Other long term (current) drug therapy: Secondary | ICD-10-CM | POA: Diagnosis not present

## 2015-08-19 DIAGNOSIS — E7800 Pure hypercholesterolemia, unspecified: Secondary | ICD-10-CM | POA: Diagnosis not present

## 2015-08-19 DIAGNOSIS — M542 Cervicalgia: Secondary | ICD-10-CM | POA: Diagnosis not present

## 2015-08-19 DIAGNOSIS — R0602 Shortness of breath: Secondary | ICD-10-CM | POA: Diagnosis not present

## 2015-08-19 DIAGNOSIS — M810 Age-related osteoporosis without current pathological fracture: Secondary | ICD-10-CM | POA: Diagnosis not present

## 2015-08-19 DIAGNOSIS — R300 Dysuria: Secondary | ICD-10-CM | POA: Diagnosis not present

## 2015-08-20 ENCOUNTER — Ambulatory Visit: Payer: Self-pay | Admitting: *Deleted

## 2015-08-20 ENCOUNTER — Telehealth: Payer: Self-pay

## 2015-08-20 ENCOUNTER — Other Ambulatory Visit: Payer: Self-pay | Admitting: *Deleted

## 2015-08-20 DIAGNOSIS — M519 Unspecified thoracic, thoracolumbar and lumbosacral intervertebral disc disorder: Secondary | ICD-10-CM

## 2015-08-20 NOTE — Telephone Encounter (Signed)
Ok to order 

## 2015-08-20 NOTE — Patient Outreach (Signed)
[facility]) Care Management  [place]  08/20/2015   Chris Payne 07-15-1950 [id number]  Subjective: RN Health Coach telephone call to patient.  Hipaa compliance verified. Per patient he is not having any shortness of breath. The swelling in the lower extremities is much better since last follow up outreach. Patient was placed on lasix. Patient is not having any wheezing but stated he has been coughing up mucous. It has been clear. Per patient the Dr is calling in some antibiotics. Patient is not on any inhalers. Patient will need continuing education to be aware when to call physician before symptoms require hospitalization.Per patient his walker seat is broken in half. He stated that the Dr had called in one to [facility]  but he never got it or heard from anyone. RN Health Coach called [facility]. The Dr had indeed sent a request in for a new walker in December. The patient had a co pay of $15. He had given [facility]  the brother in law phone number to call for the payment of the co pay. The number was incorrect when [facility] called and no one paid the co pay. The prescription has expired and a new one is needed. RN Health Coach called PCP office and made them aware of the above and that a new prescription is needed. RN Health Coach called patient back and made him aware of the information that was found. RN told patient that Dr office notified to call in new prescription. RN Gave him [facility] number so he could make sure payment arrangement made.     Objective:   Encounter Medications:  Outpatient Encounter Prescriptions as of 08/20/2015  Medication Sig Note  . aspirin EC 81 MG tablet Take 81 mg by mouth every morning.  07/10/2013: .  . atorvastatin (LIPITOR) 80 MG tablet Take by mouth. 05/21/2015: Received from: [place]  . b complex vitamins tablet Take by mouth. 05/21/2015: Received from: [place]  . clopidogrel (PLAVIX) 75 MG tablet Take 1  tablet (75 mg total) by mouth daily with breakfast. (Patient not taking: Reported on 06/12/2015)   . docusate sodium (COLACE) 100 MG capsule Take by mouth. 05/21/2015: Received from: [place]  . fenofibrate 54 MG tablet Take by mouth. Reported on 06/25/2015 05/21/2015: Received from: [facility]  . finasteride (PROSCAR) 5 MG tablet Reported on 06/12/2015 05/21/2015: Received from: External Pharm[facility]  . furosemide (LASIX) 20 MG tablet Take 1 tablet (20 mg total) by mouth daily.   Marland Kitchen gabapentin (NEURONTIN) 100 MG capsule Take 1 capsule (100 mg total) by mouth 3 (three) times daily. (Patient not taking: Reported on 06/12/2015)   . HYDROcodone-acetaminophen (NORCO) 10-325 MG tablet Take 1 tablet by mouth 2 (two) times daily as needed for severe pain.   . isosorbide mononitrate (IMDUR) 120 MG 24 hr tablet Take 1 tablet (120 mg total) by mouth daily. <PLEASE MAKE APPOINTMENT FOR REFILLS>   . levETIRAcetam (KEPPRA) 500 MG tablet Take 1 tablet (500 mg total) by mouth 2 (two) times daily.   Marland Kitchen morphine (MS CONTIN) 30 MG 12 hr tablet Take 1 tablet (30 mg total) by mouth every 12 (twelve) hours.   . nitroGLYCERIN (NITROSTAT) 0.4 MG SL tablet Place 0.4 mg under the tongue every 5 (five) minutes as needed for chest pain.    . ranolazine (RANEXA) 1000 MG SR tablet Take 1 tablet (1,000 mg total) by mouth 2 (two) times daily. Please schedule appointment for refills.   Marland Kitchen  rosuvastatin (CRESTOR) 40 MG tablet Reported on 06/25/2015 06/03/2015: Received from: External Pharm[facility]  . tamsulosin (FLOMAX) 0.4 MG CAPS capsule Take 0.4 mg by mouth at bedtime.     No facility-administered encounter medications on file as of 08/20/2015.    Functional Status:  In your present state of health, do you have any difficulty performing the following activities: 08/20/2015 06/12/2015  Hearing? N N  Vision? Y Y  Difficulty concentrating or making decisions? N N  Walking or climbing stairs? Y Y  Dressing or bathing? Y Y  Doing errands,  shopping? [name]  Preparing Food and eating ? N N  Using the Toilet? N Y  In the past six months, have you accidently leaked urine? N N  Do you have problems with loss of bowel control? N N  Managing your Medications? N N  Managing your Finances? N N  Housekeeping or managing your Housekeeping? N N    Fall/Depression Screening: PHQ 2/9 Scores 08/20/2015 08/20/2015 07/16/2015 06/12/2015 05/24/2015 04/24/2015 03/22/2015  PHQ - 2 Score 1 0 0 0 0 0 0   THN CM Care Plan Problem One        Most Recent Value   Care Plan Problem One  Knowledge deficit in self management of COPD   Role Documenting the Problem One  [place] for Problem One  Active   THN Long Term Goal (31-90 days)  patient will continue to not have any admissions in the hospial for COPD in the next 90 days   THN Long Term Goal Start Date  08/20/15   Interventions for Problem One Long Term Goal  RN reminded patient to keep all appointments with PCP. RN reminded patient the importance of taking medications as per physician order. RN will monitor monthly telephonically   THN CM Short Term Goal #1 (0-30 days)  Patient will be able to verbalize the zones of copd in the next 30 days   THN CM Short Term Goal #1 Start Date  08/20/15 [this is ongoing process. Need to reiterate zones]   Interventions for Short Term Goal #1  RN discussed Zones of COPD. RN sent EMMI information of Zones and action plans. RN sent patient a COPD zone magnet for refrigerator. Rn sent patient informatomation on COPD exacerbations   THN CM Short Term Goal #2 (0-30 days)  PPatient will be able to describe the action plan for COPD in the next 30 days   THN CM Short Term Goal #2 Start Date  08/20/15   Interventions for Short Term Goal #2  RN sent action plan for COPD zones. Since patient is not on any inhalers. He just needs to understand when to call the Dr before symptoms get worse. RN does a  discussion with teach back with each follow up outreach   THN CM  Short Term Goal #3 (0-30 days)  Patient will report obtaining his new rollator within the next 30 days   THN CM Short Term Goal #3 Start Date  08/20/15   Interventions for Short Tern Goal #3  Patient rollator  seat is broken in half. [facility] to follow up on rationale for patient not receiving. RN followed up with PCP  for new prescription since patient did not pick up initially and prescription expired     Assessment: Patient will continue to benefit from [place] telephonic outreach for education and support for COPD self management. Patient needs new walker  Plan:  RN followed up with [place] RN notified PCP of prescription expiration on walker RN followed up with patient on obtaining walker RN will send patient education information on peripheral edema RN will send EMMI on COPD what you can do RN will send EMMI on When to get help RN will follow up within 30 days for  Discussion and teach back [place] [facility] [phone]

## 2015-08-20 NOTE — Telephone Encounter (Signed)
Yes

## 2015-08-20 NOTE — Telephone Encounter (Signed)
Chris Payne pleasant from [facility] needing [facility] to replace his walker with a seat  Best number  For frances [phone]

## 2015-08-21 NOTE — Telephone Encounter (Signed)
Left message for pt to call back  °

## 2015-08-30 DIAGNOSIS — M545 Low back pain: Secondary | ICD-10-CM | POA: Diagnosis not present

## 2015-08-30 NOTE — Telephone Encounter (Signed)
I believe I placed the order in your box to sign.

## 2015-09-05 ENCOUNTER — Other Ambulatory Visit: Payer: Self-pay | Admitting: *Deleted

## 2015-09-05 ENCOUNTER — Encounter: Payer: Self-pay | Admitting: *Deleted

## 2015-09-05 NOTE — Patient Outreach (Signed)
[facility]) Care Management  [place]  09/05/2015   Chris Payne 07-21-1950 [id number]  Subjective: RN Health Coach telephone call to patient.  Hipaa compliance verified. Per patient he is doing real good. No coughing, discolored sputum or shortness of breath at this time. Per patient he is just trying to keep warm. Patient does not have any swelling in lower extremities, Patient is still waiting on his rollator walker to come through. RN explained to patient that the Dr is working on it. Patient has to be prompted to tell how he is feeling and any zones.    Objective:   Encounter Medications:  Outpatient Encounter Prescriptions as of 09/05/2015  Medication Sig Note  . aspirin EC 81 MG tablet Take 81 mg by mouth every morning.  07/10/2013: .  . atorvastatin (LIPITOR) 80 MG tablet Take by mouth. 05/21/2015: Received from: [place]  . b complex vitamins tablet Take by mouth. 05/21/2015: Received from: [place]  . clopidogrel (PLAVIX) 75 MG tablet Take 1 tablet (75 mg total) by mouth daily with breakfast. (Patient not taking: Reported on 06/12/2015)   . docusate sodium (COLACE) 100 MG capsule Take by mouth. 05/21/2015: Received from: [place]  . fenofibrate 54 MG tablet Take by mouth. Reported on 06/25/2015 05/21/2015: Received from: [facility]  . finasteride (PROSCAR) 5 MG tablet Reported on 06/12/2015 05/21/2015: Received from: External Pharmacy  . furosemide (LASIX) 20 MG tablet Take 1 tablet (20 mg total) by mouth daily.   Marland Kitchen gabapentin (NEURONTIN) 100 MG capsule Take 1 capsule (100 mg total) by mouth 3 (three) times daily. (Patient not taking: Reported on 06/12/2015)   . HYDROcodone-acetaminophen (NORCO) 10-325 MG tablet Take 1 tablet by mouth 2 (two) times daily as needed for severe pain.   . isosorbide mononitrate (IMDUR) 120 MG 24 hr tablet Take 1 tablet (120 mg total) by mouth daily. <PLEASE MAKE APPOINTMENT FOR REFILLS>   . levETIRAcetam (KEPPRA)  500 MG tablet Take 1 tablet (500 mg total) by mouth 2 (two) times daily.   Marland Kitchen morphine (MS CONTIN) 30 MG 12 hr tablet Take 1 tablet (30 mg total) by mouth every 12 (twelve) hours.   . nitroGLYCERIN (NITROSTAT) 0.4 MG SL tablet Place 0.4 mg under the tongue every 5 (five) minutes as needed for chest pain.    . ranolazine (RANEXA) 1000 MG SR tablet Take 1 tablet (1,000 mg total) by mouth 2 (two) times daily. Please schedule appointment for refills.   . rosuvastatin (CRESTOR) 40 MG tablet Reported on 06/25/2015 06/03/2015: Received from: External Pharm[facility]  . tamsulosin (FLOMAX) 0.4 MG CAPS capsule Take 0.4 mg by mouth at bedtime.     No facility-administered encounter medications on file as of 09/05/2015.    Functional Status:  In your present state of health, do you have any difficulty performing the following activities: 09/05/2015 08/20/2015  Hearing? N N  Vision? Y Y  Difficulty concentrating or making decisions? N N  Walking or climbing stairs? Y Y  Dressing or bathing? Y Y  Doing errands, shopping? [name]  Preparing Food and eating ? N N  Using the Toilet? N N  In the past six months, have you accidently leaked urine? N N  Do you have problems with loss of bowel control? N N  Managing your Medications? N N  Managing your Finances? N N  Housekeeping or managing your Housekeeping? N N    Fall/Depression Screening: [facility] 2/9 Scores 09/05/2015 08/20/2015 08/20/2015 07/16/2015 06/12/2015  05/24/2015 04/24/2015  PHQ - 2 Score 0 1 0 0 0 0 0   THN CM Care Plan Problem One        Most Recent Value   Care Plan Problem One  Knowledge deficit in self management of COPD   Role Documenting the Problem One  [place] for Problem One  Active   THN Long Term Goal (31-90 days)  patient will continue to not have any admissions in the hospial for COPD in the next 90 days   THN Long Term Goal Start Date  09/05/15 [continue]   Interventions for Problem One Long Term Goal  RN reminded patient to keep  all appointments with PCP. RN reminded patient the importance of taking medications as per physician order. RN will monitor monthly telephonically   THN CM Short Term Goal #1 Start Date  09/05/15 [continue/ Patient likes to state feeling good versus what zone he is in.]   Interventions for Short Term Goal #1  RN discussed Zones of COPD. RN sent EMMI information of Zones and action plans. RN sent patient a COPD zone magnet for refrigerator. Rn sent patient informatomation on COPD exacerbations   THN CM Short Term Goal #2 (0-30 days)  PPatient will be able to describe the action plan for COPD in the next 30 days   THN CM Short Term Goal #2 Start Date  -- [continue. RN have to ask patient what he would do if certain symptoms occur to keep  him focused on the action plan]   Interventions for Short Term Goal #2  RN sent action plan for COPD zones. Since patient is not on any inhalers. He just needs to understand when to call the Dr before symptoms get worse. RN does a  discussion with teach back with each follow up outreach   THN CM Short Term Goal #3 (0-30 days)  Patient will report obtaining his new rollator within the next 30 days   THN CM Short Term Goal #3 Start Date  09/05/15 [not met. Noted in Dr notes they are working on it]   Interventions for Short Tern Goal #3  Patient rollator  seat is broken in half. [facility] to follow up on rationale for patient not receiving. RN followed up with PCP  for new prescription since patient did not pick up initially and prescription expired      Assessment:   Plan:  RN sent patient education material on Chronic Obstructive Pulmonary Disease Exacerbation RN sent patient education material on Shortness of Breath RN sent patient education material on Adult Community Acquired Pneumonia  RN will follow up within a month for follow up discussion and teach back and to see if he has received the new [place] [facility] [phone]

## 2015-09-06 ENCOUNTER — Encounter: Payer: Self-pay | Admitting: *Deleted

## 2015-09-14 ENCOUNTER — Other Ambulatory Visit: Payer: Self-pay | Admitting: Cardiology

## 2015-09-14 ENCOUNTER — Other Ambulatory Visit: Payer: Self-pay | Admitting: Family Medicine

## 2015-09-16 NOTE — Telephone Encounter (Signed)
Rx(s) sent to pharmacy electronically.  

## 2015-09-17 ENCOUNTER — Ambulatory Visit: Payer: Medicare Other | Admitting: Family Medicine

## 2015-09-17 ENCOUNTER — Ambulatory Visit: Payer: Medicare Other | Admitting: *Deleted

## 2015-09-19 ENCOUNTER — Telehealth: Payer: Self-pay

## 2015-09-19 NOTE — Telephone Encounter (Signed)
Pt called requesting an "antibiotic for his kidneys."  I advised pt he needs to be seen. Pt wants to see Dr. Tamala Julian. I advised pt of Dr. Thompson Caul schedule.

## 2015-10-01 ENCOUNTER — Encounter: Payer: Self-pay | Admitting: *Deleted

## 2015-10-01 ENCOUNTER — Other Ambulatory Visit: Payer: Self-pay | Admitting: *Deleted

## 2015-10-01 NOTE — Patient Outreach (Signed)
[facility]) Care Management  [place]  10/01/2015   Chris Payne 30-Nov-1950 [id number]  Subjective: RN Health Coach telephone call to patient.  Hipaa compliance verified. Per patient he is doing well. He is in the green zone. Patient stated he is not having any coughing or wheezing. He stated he feels good. Per patient he is sitting outside getting shaved. Patient stated he has not received his rollator walker yet. He stated he did not know what the hold up is. Patient had not checked to find out why it has not been delivered. Patient has agreed to follow up outreach.    Objective:   Encounter Medications:  Outpatient Encounter Prescriptions as of 10/01/2015  Medication Sig Note  . aspirin EC 81 MG tablet Take 81 mg by mouth every morning.  07/10/2013: .  . atorvastatin (LIPITOR) 80 MG tablet Take by mouth. 05/21/2015: Received from: [place]  . b complex vitamins tablet Take by mouth. 05/21/2015: Received from: [place]  . docusate sodium (COLACE) 100 MG capsule Take by mouth. 05/21/2015: Received from: [place]  . fenofibrate 54 MG tablet Take by mouth. Reported on 06/25/2015 05/21/2015: Received from: [facility]  . finasteride (PROSCAR) 5 MG tablet Reported on 06/12/2015 05/21/2015: Received from: External Pharmacy  . furosemide (LASIX) 20 MG tablet Take 1 tablet (20 mg total) by mouth daily.   Marland Kitchen HYDROcodone-acetaminophen (NORCO) 10-325 MG tablet Take 1 tablet by mouth 2 (two) times daily as needed for severe pain.   . isosorbide mononitrate (IMDUR) 120 MG 24 hr tablet Take 1 tablet (120 mg total) by mouth daily. PLEASE CONTACT OFFICE FOR ADDITIONAL REFILLS   . levETIRAcetam (KEPPRA) 500 MG tablet TAKE ONE TABLET BY MOUTH TWICE DAILY   . morphine (MS CONTIN) 30 MG 12 hr tablet Take 1 tablet (30 mg total) by mouth every 12 (twelve) hours.   . nitroGLYCERIN (NITROSTAT) 0.4 MG SL tablet Place 0.4 mg under the tongue every 5 (five) minutes as needed for  chest pain.    . ranolazine (RANEXA) 1000 MG SR tablet Take 1 tablet (1,000 mg total) by mouth 2 (two) times daily. Please schedule appointment for refills.   . rosuvastatin (CRESTOR) 40 MG tablet Reported on 06/25/2015 06/03/2015: Received from: External Pharm[facility]  . tamsulosin (FLOMAX) 0.4 MG CAPS capsule Take 0.4 mg by mouth at bedtime.    . clopidogrel (PLAVIX) 75 MG tablet Take 1 tablet (75 mg total) by mouth daily with breakfast. (Patient not taking: Reported on 06/12/2015)   . gabapentin (NEURONTIN) 100 MG capsule Take 1 capsule (100 mg total) by mouth 3 (three) times daily. (Patient not taking: Reported on 06/12/2015)    No facility-administered encounter medications on file as of 10/01/2015.    Functional Status:  In your present state of health, do you have any difficulty performing the following activities: 09/05/2015 08/20/2015  Hearing? N N  Vision? Y Y  Difficulty concentrating or making decisions? N N  Walking or climbing stairs? Y Y  Dressing or bathing? Y Y  Doing errands, shopping? [name]  Preparing Food and eating ? N N  Using the Toilet? N N  In the past six months, have you accidently leaked urine? N N  Do you have problems with loss of bowel control? N N  Managing your Medications? N N  Managing your Finances? N N  Housekeeping or managing your Housekeeping? N N    Fall/Depression Screening: [facility] 2/9 Scores 09/05/2015 08/20/2015 08/20/2015 07/16/2015 06/12/2015  05/24/2015 04/24/2015  PHQ - 2 Score 0 1 0 0 0 0 0   THN CM Care Plan Problem One        Most Recent Value   Care Plan Problem One  Knowledge deficit in self management of COPD   Role Documenting the Problem One  [place] for Problem One  Active   THN Long Term Goal (31-90 days)  patient will continue to not have any admissions in the hospial for COPD in the next 90 days   THN Long Term Goal Start Date  10/01/15 [ongoing]   Interventions for Problem One Long Term Goal  RN reminded patient to keep all  appointments with PCP. RN reminded patient the importance of taking medications as per physician order. RN will monitor monthly telephonically   THN CM Short Term Goal #1 (0-30 days)  Patient will be able to verbalize the zones of copd in the next 30 days   THN CM Short Term Goal #1 Start Date  10/01/15 [ongoing/ Patient has to be prompted to state zones]   THN CM Short Term Goal #2 (0-30 days)  PPatient will be able to describe the action plan for COPD in the next 30 days   THN CM Short Term Goal #2 Start Date  10/01/15 [Patient calls Dr when symptoms occur but is not following up with making appt to see physician ]   Interventions for Short Term Goal #2  RN sent action plan for COPD zones. Since patient is not on any inhalers. He just needs to understand when to call the Dr before symptoms get worse. RN does a  discussion with teach back with each follow up outreach   THN CM Short Term Goal #3 (0-30 days)  Patient will report obtaining his new rollator within the next 30 days   THN CM Short Term Goal #3 Start Date  10/01/15 [no met/ patient still has not received rollator walker]   Interventions for Short Tern Goal #3  Patient rollator  seat is broken in half. [facility] to follow up on rationale for patient not receiving. RN followed up with PCP  for new prescription since patient did not pick up initially and prescription expired. RN is following up again with [facility] since patient has not received walker.      Assessment:  Patient will continue to  benefit from Health Coach telephonic outreach for education and support for diabetes self management.  Plan:  RN contacted [facility] for follow up on rollator walker RN sent patient educational material on Managing your COPD Day to Day RN will follow up within 30 days for discussion and teach back  [place] [facility] [phone]

## 2015-10-07 ENCOUNTER — Other Ambulatory Visit: Payer: Self-pay | Admitting: Family Medicine

## 2015-10-28 ENCOUNTER — Telehealth: Payer: Self-pay

## 2015-10-28 ENCOUNTER — Other Ambulatory Visit: Payer: Self-pay | Admitting: *Deleted

## 2015-10-28 DIAGNOSIS — M5136 Other intervertebral disc degeneration, lumbar region: Secondary | ICD-10-CM

## 2015-10-28 NOTE — Patient Outreach (Signed)
[facility]) [facility]) Care Management  [place]  10/28/2015   Chris Payne 1950/11/19 [id number]  Subjective: RN Health Coach telephone call to patient.  Hipaa compliance verified. Per patient he has swelling in lower extremities all the way up to his knees. Patient stated he has an appointment Thursday with the vein specialist for the swelling. Per patient he has shortness of breath with activities only. Patient does not know how much fluid he has gained because he has not weighed. Patient had hx of CHF also. Patient is not having any shortness of breath on rest. Patient did not follow up with PCP after fluid pills had been prescribed as he stated he would. Patient is sitting on porch with legs elevated. Per patient he has not received his rollator walker. When asked patient had [facility] with [facility]. Patient stated no. RN called [facility] and was told that the 1st prescription had expired. They needed a card on file and the number for brother-in-law that handles their finances was incorrect. They had called the new number that was given and left a message . The brother-in-law never called back. RN called the PCP and asked for another prescription for the rollator walker to be sent in. If possible to please send by Thurs when the patient went for an appointment with the vein specialist transported by the brother-in-law. He can take [facility] to [facility] and provide the card. Patient has agreed to follow up outreach calls.   Objective:   Encounter Medications:  Outpatient Encounter Prescriptions as of 10/28/2015  Medication Sig Note  . aspirin EC 81 MG tablet Take 81 mg by mouth every morning.  07/10/2013: .  . atorvastatin (LIPITOR) 80 MG tablet Take by mouth. 05/21/2015: Received from: [facility]  . b complex vitamins tablet Take by mouth. 05/21/2015: Received from: [facility]  . clopidogrel (PLAVIX) 75 MG  tablet Take 1 tablet (75 mg total) by mouth daily with breakfast. (Patient not taking: Reported on 06/12/2015)   . docusate sodium (COLACE) 100 MG capsule Take by mouth. 05/21/2015: Received from: [facility]  . fenofibrate 54 MG tablet Take by mouth. Reported on 06/25/2015 05/21/2015: Received from: [facility]  . finasteride (PROSCAR) 5 MG tablet Reported on 06/12/2015 05/21/2015: Received from: External Pharmacy  . furosemide (LASIX) 20 MG tablet Take 1 tablet (20 mg total) by mouth daily.   Marland Kitchen gabapentin (NEURONTIN) 100 MG capsule Take 1 capsule (100 mg total) by mouth 3 (three) times daily. (Patient not taking: Reported on 06/12/2015)   . HYDROcodone-acetaminophen (NORCO) 10-325 MG tablet Take 1 tablet by mouth 2 (two) times daily as needed for severe pain.   . isosorbide mononitrate (IMDUR) 120 MG 24 hr tablet Take 1 tablet (120 mg total) by mouth daily. PLEASE CONTACT OFFICE FOR ADDITIONAL REFILLS   . levETIRAcetam (KEPPRA) 500 MG tablet TAKE ONE TABLET BY MOUTH TWICE DAILY   . morphine (MS CONTIN) 30 MG 12 hr tablet Take 1 tablet (30 mg total) by mouth every 12 (twelve) hours.   . nitroGLYCERIN (NITROSTAT) 0.4 MG SL tablet Place 0.4 mg under the tongue every 5 (five) minutes as needed for chest pain.    . ranolazine (RANEXA) 1000 MG SR tablet Take 1 tablet (1,000 mg total) by mouth 2 (two) times daily. Please schedule appointment for refills.   . rosuvastatin (CRESTOR) 40 MG tablet Reported on 06/25/2015 06/03/2015: Received from: External Pharmacy  .  tamsulosin (FLOMAX) 0.4 MG CAPS capsule Take 0.4 mg by mouth at bedtime.     No facility-administered encounter medications on file as of 10/28/2015.    Functional Status:  In your present state of health, do you have any difficulty performing the following activities: 09/05/2015 08/20/2015  Hearing? N N  Vision? Y Y  Difficulty concentrating or making decisions? N N  Walking or climbing stairs? Y Y  Dressing or bathing? Y Y  Doing errands,  shopping? [name]  Preparing Food and eating ? N N  Using the Toilet? N N  In the past six months, have you accidently leaked urine? N N  Do you have problems with loss of bowel control? N N  Managing your Medications? N N  Managing your Finances? N N  Housekeeping or managing your Housekeeping? N N    Fall/Depression Screening: PHQ 2/9 Scores 10/28/2015 09/05/2015 08/20/2015 08/20/2015 07/16/2015 06/12/2015 05/24/2015  PHQ - 2 Score 0 0 1 0 0 0 0   THN CM Care Plan Problem One        Most Recent Value   Care Plan Problem One  Knowledge deficit in self management of COPD   Role Documenting the Problem One  [place] for Problem One  Active   THN Long Term Goal (31-90 days)  patient will continue to not have any admissions in the hospial for COPD in the next 90 days   THN Long Term Goal Start Date  10/28/15   Interventions for Problem One Long Term Goal  RN reminded patient to keep all appointments with PCP. RN reminded patient the importance of taking medications as per physician order. RN will monitor monthly telephonically   THN CM Short Term Goal #1 (0-30 days)  Patient will be able to verbalize the zones of copd in the next 30 days   THN CM Short Term Goal #1 Start Date  10/28/15   Interventions for Short Term Goal #1  RN discussed Zones of COPD. RN sent EMMI information of Zones and action plans. RN sent patient a COPD zone magnet for refrigerator. Rn sent patient informatomation on COPD exacerbations   THN CM Short Term Goal #2 (0-30 days)  PPatient will be able to describe the action plan for COPD in the next 30 days   THN CM Short Term Goal #2 Start Date  10/28/15   Interventions for Short Term Goal #2  RN sent action plan for COPD zones. Since patient is not on any inhalers. He just needs to understand when to call the Dr before symptoms get worse. RN does a  discussion with teach back with each follow up outreach   THN CM Short Term Goal #3 (0-30 days)  Patient will report  obtaining his new rollator within the next 30 days   THN CM Short Term Goal #3 Start Date  10/28/15   Interventions for Short Tern Goal #3  Patient rollator  seat is broken in half. RN called [facility] to follow up on rationale for patient not receiving. RN followed up with PCP  for new prescription since patient did not pick up initially and prescription expired. RN is following up again with [facility] since patient has not received walker.      Assessment:  Patient did not follow up with PCP Patient did not follow up [place] Patient will continue to benefit from Health Coach telephonic outreach for education and support for diabetes  self management.  Plan:  RN notified PCP of new prescription needed for rollator walker to be sent to [place] possibly by July 6,2017 RN sent a new magnet and sheet for COPD zones RN will send educational material on why the patient should weigh and record for his CHF RN will follow up with a month to see if patient followed through with Dr appointment and [place] RN will follow up and discuss education material within 30 days.

## 2015-10-28 NOTE — Telephone Encounter (Signed)
Anne Fu from Care Management is calling to request a prescription for Roll Aid walker sent to [facility] by Thursday. She states that the first one expired and [facility] requires a new prescription.  [phone]

## 2015-10-31 DIAGNOSIS — I8312 Varicose veins of left lower extremity with inflammation: Secondary | ICD-10-CM | POA: Diagnosis not present

## 2015-10-31 DIAGNOSIS — R600 Localized edema: Secondary | ICD-10-CM | POA: Diagnosis not present

## 2015-10-31 DIAGNOSIS — I8311 Varicose veins of right lower extremity with inflammation: Secondary | ICD-10-CM | POA: Diagnosis not present

## 2015-10-31 NOTE — Telephone Encounter (Signed)
Dr Tamala Julian, pt was last seen by you 1/27 and was supposed to f/up in 2 mos. Do you want to Rx a rolling walker? I'm not sure if you OV notes from last OV detail need for walker in enough depth for Medicare?

## 2015-11-01 NOTE — Telephone Encounter (Signed)
Yes. Fine to provide rx for rolling walker for DDD lumbar spine.  Please call patient also to schedule an OV with me; please advise him that I will not be able to continue to provide rx like rolling walker without ongoing visits to check status.  I need to see him every 3-4 months.

## 2015-11-05 ENCOUNTER — Other Ambulatory Visit: Payer: Self-pay | Admitting: *Deleted

## 2015-11-05 NOTE — Patient Outreach (Signed)
[facility]) Care Management  11/05/2015  Chris Payne [birth year] [id number]  Initial telephone call from Dr office to make [place] aware that prescription for rollator walker has been sent in. Dr office stated that patient needs to come in for a visit. It has been a long time since they have seen him. RN called patient and informed him about the walker prescription being sent in. RN explained that he needs to go into Dr  for a visit. RN stated that if you don't go in for your visit that the Dr will not be able to prescribe any medications without seeing you. I explained that he needs to call [facility] tomorrow to make sure it is ready.  [place] [facility] Management [phone]

## 2015-11-05 NOTE — Telephone Encounter (Signed)
Faxed Rx to [facility] and notified Dub Mikes from Care Management. Also called and LMOM for pt advising of need for f/up now and Q 3-4 mos.

## 2015-11-18 ENCOUNTER — Telehealth: Payer: Self-pay

## 2015-11-18 NOTE — Telephone Encounter (Signed)
Patient request a refill of Finasteride, Rosuvastatin , Tansulosin, Levetiracetan [phone].

## 2015-11-25 ENCOUNTER — Other Ambulatory Visit: Payer: Self-pay | Admitting: *Deleted

## 2015-11-25 NOTE — Patient Outreach (Signed)
[facility]) Care Management  11/25/2015  Chris Payne [birth year] [id number]   RN Health Coach  attempted  Follow up outreach call to patient.  Per wife patient was npt at home. HIPPA compliance voicemail message was left. Plan: RN will call patient again within 14 days.     [place] Care Management [phone]

## 2015-12-06 ENCOUNTER — Ambulatory Visit: Payer: Self-pay | Admitting: *Deleted

## 2015-12-10 ENCOUNTER — Other Ambulatory Visit: Payer: Self-pay | Admitting: *Deleted

## 2015-12-10 NOTE — Patient Outreach (Signed)
Chris Payne) Care Management  12/10/2015   Chris Payne 1950/08/09 [id number]  Subjective: RN Health Coach telephone call to patient.  Hipaa compliance verified. Patient stated he is feeling pretty good. Patient stated he went to the vein specialist but has not went to the PCP.  RN explained again that he was told he needed to make an appointment with the PCP. RN explained that the PCP will not keep prescribing medication if he doesn't make an appointment. Patient did not follow up with getting the rollator walker after second prescription called in. Per patient he will fix the seat. Per patient he stated, "he is going to call the physician for an appointment tomorrow ."   Objective:   Current Medications:  Current Outpatient Prescriptions  Medication Sig Dispense Refill  . aspirin EC 81 MG tablet Take 81 mg by mouth every morning.     Marland Kitchen atorvastatin (LIPITOR) 80 MG tablet Take by mouth.    Marland Kitchen b complex vitamins tablet Take by mouth.    . docusate sodium (COLACE) 100 MG capsule Take by mouth.    . fenofibrate 54 MG tablet Take by mouth. Reported on 06/25/2015    . finasteride (PROSCAR) 5 MG tablet Reported on 06/12/2015    . furosemide (LASIX) 20 MG tablet Take 1 tablet (20 mg total) by mouth daily. 90 tablet 1  . HYDROcodone-acetaminophen (NORCO) 10-325 MG tablet Take 1 tablet by mouth 2 (two) times daily as needed for severe pain. 14 tablet 0  . isosorbide mononitrate (IMDUR) 120 MG 24 hr tablet Take 1 tablet (120 mg total) by mouth daily. PLEASE CONTACT OFFICE FOR ADDITIONAL REFILLS 15 tablet 0  . levETIRAcetam (KEPPRA) 500 MG tablet TAKE ONE TABLET BY MOUTH TWICE DAILY 60 tablet 0  . morphine (MS CONTIN) 30 MG 12 hr tablet Take 1 tablet (30 mg total) by mouth every 12 (twelve) hours. 14 tablet 0  . nitroGLYCERIN (NITROSTAT) 0.4 MG SL tablet Place 0.4 mg under the tongue every 5 (five) minutes as needed for chest pain.     . ranolazine (RANEXA) 1000 MG SR tablet Take 1  tablet (1,000 mg total) by mouth 2 (two) times daily. Please schedule appointment for refills. 60 each 0  . rosuvastatin (CRESTOR) 40 MG tablet Reported on 06/25/2015    . tamsulosin (FLOMAX) 0.4 MG CAPS capsule Take 0.4 mg by mouth at bedtime.     . clopidogrel (PLAVIX) 75 MG tablet Take 1 tablet (75 mg total) by mouth daily with breakfast. (Patient not taking: Reported on 06/12/2015) 30 tablet 0  . gabapentin (NEURONTIN) 100 MG capsule Take 1 capsule (100 mg total) by mouth 3 (three) times daily. (Patient not taking: Reported on 06/12/2015) 90 capsule 3   No current facility-administered medications for this visit.     Functional Status:  In your present state of health, do you have any difficulty performing the following activities: 12/10/2015 09/05/2015  Hearing? N N  Vision? Y Y  Difficulty concentrating or making decisions? N N  Walking or climbing stairs? Y Y  Dressing or bathing? Y Y  Doing errands, shopping? [name]  Preparing Food and eating ? N N  Using the Toilet? N N  In the past six months, have you accidently leaked urine? N N  Do you have problems with loss of bowel control? N N  Managing your Medications? N N  Managing your Finances? N N  Housekeeping or managing your Housekeeping? Y N  Some recent data  might be hidden    Fall/Depression Screening: PHQ 2/9 Scores 12/10/2015 10/28/2015 09/05/2015 08/20/2015 08/20/2015 07/16/2015 06/12/2015  PHQ - 2 Score 0 0 0 1 0 0 0   THN CM Care Plan Problem One   Flowsheet Row Most Recent Value  Care Plan Problem One  Knowledge deficit in self management of COPD  Role Documenting the Problem One  [place] for Problem One  Active  THN Long Term Goal (31-90 days)  patient will continue to not have any admissions in the hospial for COPD in the next 90 days  THN Long Term Goal Start Date  12/10/15  Interventions for Problem One Long Term Goal  RN reminded patient to keep all appointments with PCP. RN reminded patient the importance  of taking medications as per physician order. RN will monitor monthly telephonically  THN CM Short Term Goal #1 (0-30 days)  Patient will be able to verbalize the zones of copd in the next 30 days  THN CM Short Term Goal #1 Start Date  12/10/15  Interventions for Short Term Goal #1  RN discussed Zones of COPD. RN sent EMMI information of Zones and action plans. RN sent patient a COPD zone magnet for refrigerator. Rn sent patient informatomation on COPD exacerbations  THN CM Short Term Goal #2 (0-30 days)  Patient will be able to describe the action plan for COPD in the next 30 days  THN CM Short Term Goal #2 Start Date  12/10/15  Interventions for Short Term Goal #2  RN sent action plan for COPD zones. Since patient is not on any inhalers. He just needs to understand when to call the Dr before symptoms get worse. RN does a  discussion with teach back with each follow up outreach  THN CM Short Term Goal #3 (0-30 days)  Patient will report obtaining his new rollator within the next 30 days  Interventions for Short Tern Goal #3  Patient rollator  seat is broken in half. [facility] called [facility] to follow up on rationale for patient not receiving. RN followed up with PCP  for new prescription since patient did not pick up initially and prescription expired. RN is following up again with [facility] since patient has not received walker.      Assessment:  Patient is not compliant with making an appointment with PCP Patient is not compliant with picking up walker after asking for it several times Patient is not compliant with taking his medication (Plavix)  Plan:  RN discussed with patient the importance of medication adherence RN discussed with patient the importance of follow up visit with PCP RN will follow up outreach to see if patient has went to PCP within the month of September  Chris Payne [facility] [phone]

## 2016-01-09 ENCOUNTER — Emergency Department (HOSPITAL_COMMUNITY)
Admission: EM | Admit: 2016-01-09 | Discharge: 2016-01-09 | Disposition: A | Discharge status: Home / Self Care | Payer: Medicare Other | Attending: Emergency Medicine | Admitting: Emergency Medicine

## 2016-01-09 ENCOUNTER — Encounter (HOSPITAL_COMMUNITY): Payer: Self-pay

## 2016-01-09 DIAGNOSIS — I2510 Atherosclerotic heart disease of native coronary artery without angina pectoris: Secondary | ICD-10-CM | POA: Diagnosis not present

## 2016-01-09 DIAGNOSIS — R109 Unspecified abdominal pain: Secondary | ICD-10-CM | POA: Diagnosis not present

## 2016-01-09 DIAGNOSIS — Z7982 Long term (current) use of aspirin: Secondary | ICD-10-CM | POA: Insufficient documentation

## 2016-01-09 DIAGNOSIS — Z87891 Personal history of nicotine dependence: Secondary | ICD-10-CM | POA: Diagnosis not present

## 2016-01-09 DIAGNOSIS — J449 Chronic obstructive pulmonary disease, unspecified: Secondary | ICD-10-CM | POA: Diagnosis not present

## 2016-01-09 DIAGNOSIS — Z8673 Personal history of transient ischemic attack (TIA), and cerebral infarction without residual deficits: Secondary | ICD-10-CM | POA: Insufficient documentation

## 2016-01-09 DIAGNOSIS — B3749 Other urogenital candidiasis: Secondary | ICD-10-CM | POA: Diagnosis not present

## 2016-01-09 DIAGNOSIS — B379 Candidiasis, unspecified: Secondary | ICD-10-CM | POA: Diagnosis not present

## 2016-01-09 DIAGNOSIS — Z79899 Other long term (current) drug therapy: Secondary | ICD-10-CM | POA: Insufficient documentation

## 2016-01-09 DIAGNOSIS — I10 Essential (primary) hypertension: Secondary | ICD-10-CM | POA: Insufficient documentation

## 2016-01-09 DIAGNOSIS — I252 Old myocardial infarction: Secondary | ICD-10-CM | POA: Insufficient documentation

## 2016-01-09 DIAGNOSIS — Z951 Presence of aortocoronary bypass graft: Secondary | ICD-10-CM | POA: Diagnosis not present

## 2016-01-09 LAB — BASIC METABOLIC PANEL
ANION GAP: 6 (ref 5–15)
BUN: 10 mg/dL (ref 6–20)
CALCIUM: 9.3 mg/dL (ref 8.9–10.3)
CO2: 27 mmol/L (ref 22–32)
CREATININE: 1.04 mg/dL (ref 0.61–1.24)
Chloride: 107 mmol/L (ref 101–111)
GFR calc non Af Amer: >60 mL/min (ref >60)
Glucose, Bld: 80 mg/dL (ref 65–99)
POTASSIUM: 3.7 mmol/L (ref 3.5–5.1)
SODIUM: 140 mmol/L (ref 135–145)

## 2016-01-09 LAB — URINALYSIS, ROUTINE W REFLEX MICROSCOPIC
GLUCOSE, UA: NEGATIVE mg/dL
Hgb urine dipstick: NEGATIVE
KETONES UR: NEGATIVE mg/dL
Leukocytes, UA: NEGATIVE
NITRITE: NEGATIVE
PH: 5.5 (ref 5.0–8.0)
Protein, ur: NEGATIVE mg/dL
SPECIFIC GRAVITY, URINE: 1.019 (ref 1.005–1.030)

## 2016-01-09 LAB — CBC
HCT: 51.7 % (ref 39.0–52.0)
Hemoglobin: 17 g/dL (ref 13.0–17.0)
MCH: 33 pg (ref 26.0–34.0)
MCHC: 32.9 g/dL (ref 30.0–36.0)
MCV: 100.4 fL — ABNORMAL HIGH (ref 78.0–100.0)
PLATELETS: 148 10*3/uL — AB (ref 150–400)
RBC: 5.15 MIL/uL (ref 4.22–5.81)
RDW: 12.9 % (ref 11.5–15.5)
WBC: 5.1 10*3/uL (ref 4.0–10.5)

## 2016-01-09 MED ORDER — NYSTATIN 100000 UNIT/GM EX POWD
Freq: Once | CUTANEOUS | Status: AC
  Administered 2016-01-09: 13:00:00 via TOPICAL
  Filled 2016-01-09: qty 15

## 2016-01-09 MED ORDER — NYSTATIN 100000 UNIT/GM EX CREA: TOPICAL_CREAM | CUTANEOUS | 0 refills | Status: AC

## 2016-01-09 NOTE — Discharge Instructions (Signed)
Please apply nystatin cream to your yeast infection site twice daily for 1 week.  Follow up with your doctor for further evaluation of your urinary discomfort.

## 2016-01-09 NOTE — ED Provider Notes (Signed)
[place] DEPT Provider Note   CSN: [id number] Arrival date & time: 01/09/16  1106     History   Chief Complaint Chief Complaint  Patient presents with  . Dysuria  . Flank Pain    HPI KELON CONKIN is a 65 y.o. male.  HPI   65 year old male with history of AAA status post surgical repair in 2011, CAD, inguinal hernia, GERD presenting with complaints of dysuria.  Patient report for the past week he has had increased urinary frequency, urgency and burning on urination. He gets up 4-5 times at night to urinate. He also complaining of mild discomfort to his low back around his right flank for the past 2 days. He also has skin rash his inguinal region for the past week that is itchy and painful in which he use baby's powder and over-the-counter cream with minimal improvement. Denies having fever, chills, nausea vomiting diarrhea, chest pain, shortness of breath, hematuria, bowel bladder incontinence, or leg weakness. He has has urinary tract infection the past and this felt similar. He denies prior history of kidney stone. He denies having lightheadedness of dizziness.  Past Medical History:  Diagnosis Date  . AAA (abdominal aortic aneurysm) ([place])    s/p repair 6/11  . CAD (coronary artery disease)    s/p CABG 2/12:  LIMA to LAD, SVG to diagonal-1, SVG to ramus intermedius,, SVG to AM (Dr. Roxan Hockey) ;  b.  Myoview 10/13:  inf infarct with very mild peri-infarct ischemia, EF 49%, inf HK; c  He has severe three-vessel native coronary artery disease. Catheterization March 2015 as above  . CAP (community acquired pneumonia) 02/23/2014  . Carotid stenosis    a.  dopplers 0000000: LICA 0000000;  b. Carotid dopplers 3/14:  R 0-39%, L 60-79%, repeat 6 mos  . COPD (chronic obstructive pulmonary disease) ([place])   . GERD (gastroesophageal reflux disease)   . Headache(784.0)   . Hyperlipidemia   . Hypertension   . Hypoxia 02/23/2014  . Inguinal hernia   . Myocardial infarction [facility])    PCI x3  . PAD (peripheral artery disease) ([place])    ABIs 4/12:  R 0.88, L 0.92; R SFA 40%, L CFA 50%, L SFA 50-60%  . Pneumonia    hx  . Stroke [facility])    h/o pontine CVA  . Thyroid nodule    incidental finding on carotid doppler 3/14 => thyroid U/S ordered    Patient Active Problem List   Diagnosis Date Noted  . Chronic pain syndrome 07/19/2015  . Degenerative disc disease, lumbar 07/19/2015  . Weakness 04/17/2015  . Generalized weakness 04/17/2015  . Polycythemia 04/17/2015  . BPH (benign prostatic hyperplasia) 04/17/2015  . Seizure disorder ([place]) 10/12/2014  . TIA (transient ischemic attack) 08/23/2013  . Other and unspecified angina pectoris 07/04/2013  . Nontoxic multinodular goiter 10/07/2012  . Unstable angina ([place]) 06/19/2012  . Carotid stenosis   . PAD (peripheral artery disease) ([place])   . Left inguinal hernia 05/30/2012  . COPD (chronic obstructive pulmonary disease) ([place]) 08/24/2011  . S/P AAA repair 08/24/2011  . Lumbar disc disease   . Hypertensive heart disease   . Hyperlipidemia   . CAD (coronary artery disease)   . History of pontine CVA   . GERD (gastroesophageal reflux disease)     Past Surgical History:  Procedure Laterality Date  . ABDOMINAL AORTIC ANEURYSM REPAIR  10-03-2009  . CORONARY ARTERY BYPASS GRAFT  06/24/2010   LIMA to the LAD, SVG to first diagonal, SVG  to ramus intermediate, SVG to acute marginal. EF 50%. 2/12  . EXPLORATION POST OPERATIVE OPEN HEART    . EXPLORATORY LAPAROTOMY  09/2009   ligation of lumbar arteries  . EXTERNAL EAR SURGERY Left    laceration child  . HIP SURGERY  40 years ago   left hip bone removal and pinning  . INGUINAL HERNIA REPAIR Left 07/21/2012   Procedure: HERNIA REPAIR INGUINAL ADULT;  Surgeon: Merrie Roof, MD;  Location: [place];  Service: General;  Laterality: Left;  . INSERTION OF MESH Left 07/21/2012   Procedure: INSERTION OF MESH;  Surgeon: Merrie Roof, MD;  Location: [place];  Service: General;   Laterality: Left;  . LEFT HEART CATHETERIZATION WITH CORONARY/GRAFT ANGIOGRAM  06/21/2012   Procedure: LEFT HEART CATHETERIZATION WITH Beatrix Fetters;  Surgeon: Minus Breeding, MD;  Location: [facility] C[facility] LAB;  Service: Cardiovascular;;  . LEFT HEART CATHETERIZATION WITH CORONARY/GRAFT ANGIOGRAM N/A 07/04/2013   Procedure: LEFT HEART CATHETERIZATION WITH Beatrix Fetters;  Surgeon: Sinclair Grooms, MD;  Location: [facility] CATH LAB;  Service: Cardiovascular;  Laterality: N/A;  . NM MYOCAR PERF WALL MOTION  12/02/2010   inferoapical defect is fixed c/w prior infarction/scar, there is minimal borderzone ischemia.       Home Medications    Prior to Admission medications   Medication Sig Start Date End Date Taking? Authorizing Provider  aspirin EC 81 MG tablet Take 81 mg by mouth every morning.     Historical Provider, MD  atorvastatin (LIPITOR) 80 MG tablet Take by mouth.    Historical Provider, MD  b complex vitamins tablet Take by mouth.    Historical Provider, MD  clopidogrel (PLAVIX) 75 MG tablet Take 1 tablet (75 mg total) by mouth daily with breakfast. Patient not taking: Reported on 06/12/2015 09/23/13   Bernadene Bell, MD  docusate sodium (COLACE) 100 MG capsule Take by mouth.    Historical Provider, MD  fenofibrate 54 MG tablet Take by mouth. Reported on 06/25/2015    Historical Provider, MD  finasteride (PROSCAR) 5 MG tablet Reported on 06/12/2015 05/11/15   Historical Provider, MD  furosemide (LASIX) 20 MG tablet Take 1 tablet (20 mg total) by mouth daily. 07/16/15   Minus Breeding, MD  gabapentin (NEURONTIN) 100 MG capsule Take 1 capsule (100 mg total) by mouth 3 (three) times daily. Patient not taking: Reported on 06/12/2015 05/24/15   Wardell Honour, MD  HYDROcodone-acetaminophen [facility]) 10-325 MG tablet Take 1 tablet by mouth 2 (two) times daily as needed for severe pain. 05/24/15   Wardell Honour, MD  isosorbide mononitrate (IMDUR) 120 MG 24 hr tablet Take 1 tablet (120 mg total)  by mouth daily. PLEASE CONTACT OFFICE FOR ADDITIONAL REFILLS 09/16/15   Minus Breeding, MD  levETIRAcetam (KEPPRA) 500 MG tablet TAKE ONE TABLET BY MOUTH TWICE DAILY 09/14/15   Wardell Honour, MD  morphine (MS CONTIN) 30 MG 12 hr tablet Take 1 tablet (30 mg total) by mouth every 12 (twelve) hours. 05/24/15   Wardell Honour, MD  nitroGLYCERIN (NITROSTAT) 0.4 MG SL tablet Place 0.4 mg under the tongue every 5 (five) minutes as needed for chest pain.  06/01/13   Minus Breeding, MD  ranolazine (RANEXA) 1000 MG SR tablet Take 1 tablet (1,000 mg total) by mouth 2 (two) times daily. Please schedule appointment for refills. 07/12/15   Minus Breeding, MD  rosuvastatin (CRESTOR) 40 MG tablet Reported on 06/25/2015 05/11/15   Historical Provider, MD  tamsulosin [facility])  0.4 MG CAPS capsule Take 0.4 mg by mouth at bedtime.     Historical Provider, MD    Family History Family History  Problem Relation Age of Onset  . Alcohol abuse Father   . Cancer Sister 66    unknown primary  . Diabetes Mother   . Hypertension Mother   . Cancer Sister 28    kidney cancer    Social History Social History  Substance Use Topics  . Smoking status: Former Smoker    Packs/day: 1.00    Years: 30.00    Types: Cigarettes    Quit date: 01/25/2010  . Smokeless tobacco: Never Used  . Alcohol use No     Comment: former drinker - Quit 2011.     Allergies   Review of patient's allergies indicates no known allergies.   Review of Systems Review of Systems  All other systems reviewed and are negative.    Physical Exam Updated Vital Signs BP 111/84   Pulse 104   Temp 98.4 F (36.9 C) (Oral)   Resp 18   Ht 5\' 7"  (1.702 m)   Wt 76.6 kg   SpO2 94%   BMI 26.44 kg/m   Physical Exam  Constitutional: He appears well-developed and well-nourished. No distress.  HENT:  Head: Atraumatic.  Eyes: Conjunctivae are normal.  Neck: Neck supple.  Cardiovascular: Normal rate and regular rhythm.   Pulmonary/Chest: He has  rales.  Abdominal: Soft. Bowel sounds are normal. He exhibits no distension. There is no tenderness.  No CVA tenderness  Genitourinary:  Genitourinary Comments: Chaperone present during exam. Evidence of skin maceration noted throughout the inguinal region bilaterally, along the scrotum and the perineum consistence with these infection. Perineum is soft. No evidence of Fournier gangrene. Circumcised penis no discharge.  Neurological: He is alert.  Skin: No rash noted.  Psychiatric: He has a normal mood and affect.  Nursing note and vitals reviewed.    ED Treatments / Results  Labs (all labs ordered are listed, but only abnormal results are displayed) Labs Reviewed  CBC - Abnormal; Notable for the following:       Result Value   MCV 100.4 (*)    Platelets 148 (*)    All other components within normal limits  URINALYSIS, ROUTINE W REFLEX MICROSCOPIC (NOT AT [facility]) - Abnormal; Notable for the following:    Color, Urine AMBER (*)    Bilirubin Urine SMALL (*)    All other components within normal limits  URINE CULTURE  BASIC METABOLIC PANEL    EKG  EKG Interpretation None       Radiology No results found.  Procedures Procedures (including critical care time)  Medications Ordered in ED Medications  nystatin (MYCOSTATIN/NYSTOP) topical powder ( Topical Given 01/09/16 1245)     Initial Impression / Assessment and Plan / ED Course  I have reviewed the triage vital signs and the nursing notes.  Pertinent labs & imaging results that were available during my care of the patient were reviewed by me and considered in my medical decision making (see chart for details).  Clinical Course    BP 181/86   Pulse 61   Temp 98.4 F (36.9 C) (Oral)   Resp 16   Ht 5\' 7"  (1.702 m)   Wt 76.6 kg   SpO2 98%   BMI 26.44 kg/m  The patient was noted to be hypertensive today in the emergency department. I have spoken with the patient regarding hypertension and the need for improved  management. I instructed the patient to followup with the Primary care doctor within 4 days to improve the management of the patient's hypertension. I also counseled the patient regarding the signs and symptoms which would require an emergent visit to an emergency department for hypertensive urgency and/or hypertensive emergency. The patient understood the need for improved hypertensive management.   Final Clinical Impressions(s) / ED Diagnoses   Final diagnoses:  Candidiasis of urogenital site  Right flank pain    New Prescriptions New Prescriptions   NYSTATIN CREAM (MYCOSTATIN)    Apply to affected area 2 times daily   2:35 PM Patient presents with dysuria, burning sensation when urinating and increased frequency. He also reported having yeast infection to his perineum. On exam he does have significant skin changes around the perineum consistence with these infection. No evidence to suggest Fournier gangrene. Patient has no chest pain or shortness of breath. On exam he does not have any significant CVA tenderness or abdominal discomfort. Urine without signs of urine tract infection. Labs are reassuring. Suspect dysuria secondary to he sees infection. Patient provided with nystatin cream as well as recommendation to follow-up closely with permacath provider for further care. Patient is resting comfortably, low suspicion for dissection.  Care discussed with DR. Little.    Domenic Moras, PA-C 01/09/16 [place], MD 01/14/16 [phone]

## 2016-01-09 NOTE — ED Triage Notes (Signed)
Week ago started having pain in flank area, and having burning sensations while urinating

## 2016-01-10 LAB — URINE CULTURE: Culture: <10000 — AB

## 2016-01-14 ENCOUNTER — Other Ambulatory Visit: Payer: Self-pay | Admitting: Cardiology

## 2016-01-14 NOTE — Telephone Encounter (Signed)
Rx request sent to pharmacy.  

## 2016-01-16 ENCOUNTER — Ambulatory Visit: Payer: Self-pay | Admitting: *Deleted

## 2016-02-28 ENCOUNTER — Encounter: Payer: Self-pay | Admitting: *Deleted

## 2016-04-15 ENCOUNTER — Other Ambulatory Visit: Payer: Self-pay | Admitting: Cardiology

## 2016-07-24 ENCOUNTER — Emergency Department (HOSPITAL_COMMUNITY)
Admission: EM | Admit: 2016-07-24 | Discharge: 2016-07-24 | Disposition: A | Discharge status: Home / Self Care | Payer: Medicare Other | Attending: Emergency Medicine | Admitting: Emergency Medicine

## 2016-07-24 ENCOUNTER — Encounter (HOSPITAL_COMMUNITY): Payer: Self-pay | Admitting: Emergency Medicine

## 2016-07-24 DIAGNOSIS — I252 Old myocardial infarction: Secondary | ICD-10-CM | POA: Diagnosis not present

## 2016-07-24 DIAGNOSIS — Z8673 Personal history of transient ischemic attack (TIA), and cerebral infarction without residual deficits: Secondary | ICD-10-CM | POA: Diagnosis not present

## 2016-07-24 DIAGNOSIS — Z87891 Personal history of nicotine dependence: Secondary | ICD-10-CM | POA: Insufficient documentation

## 2016-07-24 DIAGNOSIS — I119 Hypertensive heart disease without heart failure: Secondary | ICD-10-CM | POA: Diagnosis not present

## 2016-07-24 DIAGNOSIS — B372 Candidiasis of skin and nail: Secondary | ICD-10-CM | POA: Diagnosis not present

## 2016-07-24 DIAGNOSIS — Z79899 Other long term (current) drug therapy: Secondary | ICD-10-CM | POA: Diagnosis not present

## 2016-07-24 DIAGNOSIS — I2510 Atherosclerotic heart disease of native coronary artery without angina pectoris: Secondary | ICD-10-CM | POA: Insufficient documentation

## 2016-07-24 DIAGNOSIS — J449 Chronic obstructive pulmonary disease, unspecified: Secondary | ICD-10-CM | POA: Insufficient documentation

## 2016-07-24 DIAGNOSIS — Z7982 Long term (current) use of aspirin: Secondary | ICD-10-CM | POA: Diagnosis not present

## 2016-07-24 DIAGNOSIS — L304 Erythema intertrigo: Secondary | ICD-10-CM | POA: Insufficient documentation

## 2016-07-24 DIAGNOSIS — Z951 Presence of aortocoronary bypass graft: Secondary | ICD-10-CM | POA: Diagnosis not present

## 2016-07-24 DIAGNOSIS — R1909 Other intra-abdominal and pelvic swelling, mass and lump: Secondary | ICD-10-CM | POA: Diagnosis present

## 2016-07-24 MED ORDER — CLOTRIMAZOLE 1 % EX CREA: TOPICAL_CREAM | CUTANEOUS | 0 refills | Status: DC

## 2016-07-24 NOTE — ED Triage Notes (Signed)
Pt reports bilateral groin pain x1 month, states that area is warm to the touch, no associated injury. Pt denies fever or chills.

## 2016-07-24 NOTE — ED Provider Notes (Signed)
[place] DEPT Provider Note   CSN: [id number] Arrival date & time: 07/24/16  3235     History   Chief Complaint Chief Complaint  Patient presents with  . Groin Pain    HPI Chris Payne is a 66 y.o. male.  HPI For 1 month patient has had swelling and redness to the groin skin and his scrotum. He reports is very uncomfortable when he walks. No associated pain or burning with urination. No associated abdominal pain. No fever no chills. Past Medical History:  Diagnosis Date  . AAA (abdominal aortic aneurysm) ([place])    s/p repair 6/11  . CAD (coronary artery disease)    s/p CABG 2/12:  LIMA to LAD, SVG to diagonal-1, SVG to ramus intermedius,, SVG to AM (Dr. Roxan Hockey) ;  b.  Myoview 10/13:  inf infarct with very mild peri-infarct ischemia, EF 49%, inf HK; c  He has severe three-vessel native coronary artery disease. Catheterization March 2015 as above  . CAP (community acquired pneumonia) 02/23/2014  . Carotid stenosis    a.  dopplers 5/73: LICA 22-02%;  b. Carotid dopplers 3/14:  R 0-39%, L 60-79%, repeat 6 mos  . COPD (chronic obstructive pulmonary disease) ([place])   . GERD (gastroesophageal reflux disease)   . Headache(784.0)   . Hyperlipidemia   . Hypertension   . Hypoxia 02/23/2014  . Inguinal hernia   . Myocardial infarction    PCI x3  . PAD (peripheral artery disease) ([place])    ABIs 4/12:  R 0.88, L 0.92; R SFA 40%, L CFA 50%, L SFA 50-60%  . Pneumonia    hx  . Stroke [facility])    h/o pontine CVA  . Thyroid nodule    incidental finding on carotid doppler 3/14 => thyroid U/S ordered    Patient Active Problem List   Diagnosis Date Noted  . Chronic pain syndrome 07/19/2015  . Degenerative disc disease, lumbar 07/19/2015  . Weakness 04/17/2015  . Generalized weakness 04/17/2015  . Polycythemia 04/17/2015  . BPH (benign prostatic hyperplasia) 04/17/2015  . Seizure disorder ([place]) 10/12/2014  . TIA (transient ischemic attack) 08/23/2013  . Other and  unspecified angina pectoris 07/04/2013  . Nontoxic multinodular goiter 10/07/2012  . Unstable angina ([place]) 06/19/2012  . Carotid stenosis   . PAD (peripheral artery disease) ([place])   . Left inguinal hernia 05/30/2012  . COPD (chronic obstructive pulmonary disease) ([place]) 08/24/2011  . S/P AAA repair 08/24/2011  . Lumbar disc disease   . Hypertensive heart disease   . Hyperlipidemia   . CAD (coronary artery disease)   . History of pontine CVA   . GERD (gastroesophageal reflux disease)     Past Surgical History:  Procedure Laterality Date  . ABDOMINAL AORTIC ANEURYSM REPAIR  10-03-2009  . CORONARY ARTERY BYPASS GRAFT  06/24/2010   LIMA to the LAD, SVG to first diagonal, SVG to ramus intermediate, SVG to acute marginal. EF 50%. 2/12  . EXPLORATION POST OPERATIVE OPEN HEART    . EXPLORATORY LAPAROTOMY  09/2009   ligation of lumbar arteries  . EXTERNAL EAR SURGERY Left    laceration child  . HIP SURGERY  40 years ago   left hip bone removal and pinning  . INGUINAL HERNIA REPAIR Left 07/21/2012   Procedure: HERNIA REPAIR INGUINAL ADULT;  Surgeon: Merrie Roof, MD;  Location: [place];  Service: General;  Laterality: Left;  . INSERTION OF MESH Left 07/21/2012   Procedure: INSERTION OF MESH;  Surgeon: Luella Cook  III, MD;  Location: [place];  Service: General;  Laterality: Left;  . LEFT HEART CATHETERIZATION WITH CORONARY/GRAFT ANGIOGRAM  06/21/2012   Procedure: LEFT HEART CATHETERIZATION WITH Beatrix Fetters;  Surgeon: Minus Breeding, MD;  Location[facility] CATH LAB;  Service: Cardiovascular;;  . LEFT HEART CATHETERIZATION WITH CORONARY/GRAFT ANGIOGRAM N/A 07/04/2013   Procedure: LEFT HEART CATHETERIZATION WITH Beatrix Fetters;  Surgeon: Sinclair Grooms, MD;  Location: [facility] CATH LAB;  Service: Cardiovascular;  Laterality: N/A;  . NM MYOCAR PERF WALL MOTION  12/02/2010   inferoapical defect is fixed c/w prior infarction/scar, there is minimal borderzone ischemia.       Home  Medications    Prior to Admission medications   Medication Sig Start Date End Date Taking? Authorizing Provider  aspirin EC 81 MG tablet Take 81 mg by mouth every morning.     Historical Provider, MD  atorvastatin (LIPITOR) 80 MG tablet Take by mouth.    Historical Provider, MD  b complex vitamins tablet Take by mouth.    Historical Provider, MD  clopidogrel (PLAVIX) 75 MG tablet Take 1 tablet (75 mg total) by mouth daily with breakfast. Patient not taking: Reported on 06/12/2015 09/23/13   Bernadene Bell, MD  clotrimazole (LOTRIMIN) 1 % cream Apply to affected area 2 times daily 07/24/16   Charlesetta Shanks, MD  docusate sodium (COLACE) 100 MG capsule Take by mouth.    Historical Provider, MD  fenofibrate 54 MG tablet Take by mouth. Reported on 06/25/2015    Historical Provider, MD  finasteride (PROSCAR) 5 MG tablet Reported on 06/12/2015 05/11/15   Historical Provider, MD  furosemide (LASIX) 20 MG tablet Take 1 tablet (20 mg total) by mouth daily. 07/16/15   Minus Breeding, MD  gabapentin (NEURONTIN) 100 MG capsule Take 1 capsule (100 mg total) by mouth 3 (three) times daily. Patient not taking: Reported on 06/12/2015 05/24/15   Wardell Honour, MD  HYDROcodone-acetaminophen [facility]) 10-325 MG tablet Take 1 tablet by mouth 2 (two) times daily as needed for severe pain. 05/24/15   Wardell Honour, MD  isosorbide mononitrate (IMDUR) 120 MG 24 hr tablet Take 1 tablet (120 mg total) by mouth daily. PLEASE CONTACT OFFICE FOR ADDITIONAL REFILLS 09/16/15   Minus Breeding, MD  levETIRAcetam (KEPPRA) 500 MG tablet TAKE ONE TABLET BY MOUTH TWICE DAILY 09/14/15   Wardell Honour, MD  morphine (MS CONTIN) 30 MG 12 hr tablet Take 1 tablet (30 mg total) by mouth every 12 (twelve) hours. 05/24/15   Wardell Honour, MD  nitroGLYCERIN (NITROSTAT) 0.4 MG SL tablet Place 0.4 mg under the tongue every 5 (five) minutes as needed for chest pain.  06/01/13   Minus Breeding, MD  ranolazine (RANEXA) 1000 MG SR tablet Take 1 tablet (1,000  mg total) by mouth 2 (two) times daily. Please schedule appointment for refills. 07/12/15   Minus Breeding, MD  rosuvastatin (CRESTOR) 40 MG tablet Take 1 tablet (40 mg total) by mouth daily. Please schedule appointment for refills. 01/14/16   Minus Breeding, MD  tamsulosin (FLOMAX) 0.4 MG CAPS capsule Take 0.4 mg by mouth at bedtime.     Historical Provider, MD    Family History Family History  Problem Relation Age of Onset  . Alcohol abuse Father   . Cancer Sister 65    unknown primary  . Diabetes Mother   . Hypertension Mother   . Cancer Sister 29    kidney cancer    Social History Social History  Substance Use  Topics  . Smoking status: Former Smoker    Packs/day: 1.00    Years: 30.00    Types: Cigarettes    Quit date: 01/25/2010  . Smokeless tobacco: Never Used  . Alcohol use No     Comment: former drinker - Quit 2011.     Allergies   Patient has no known allergies.   Review of Systems Review of Systems Constitutional: No fever no chills no malaise GI: No vomiting diarrhea or abdominal pain  Physical Exam Updated Vital Signs BP (!) 146/92   Pulse 82   Temp 98.2 F (36.8 C) (Oral)   Resp 16   SpO2 98%   Physical Exam  Constitutional: He is oriented to person, place, and time. He appears well-developed and well-nourished. No distress.  HENT:  Head: Normocephalic and atraumatic.  Eyes: EOM are normal.  Pulmonary/Chest: Effort normal.  Abdominal: Soft. He exhibits no distension. There is no tenderness. There is no guarding.  Genitourinary:  Genitourinary Comments: Erythema and some maceration in the intertriginous region of the scrotum against the upper thigh in the groin fold. Moderate in nature. Scrotum has erythema but no edema. Testicles are smooth and non-tender. No mass in inguinal  canal. Penis normal in appearance.  Musculoskeletal: Normal range of motion.  Neurological: He is alert and oriented to person, place, and time. No cranial nerve deficit. He  exhibits normal muscle tone. Coordination normal.  Skin: Skin is warm and dry.  Psychiatric: He has a normal mood and affect.     ED Treatments / Results  Labs (all labs ordered are listed, but only abnormal results are displayed) Labs Reviewed - No data to display  EKG  EKG Interpretation None       Radiology No results found.  Procedures Procedures (including critical care time)  Medications Ordered in ED Medications - No data to display   Initial Impression / Assessment and Plan / ED Course  I have reviewed the triage vital signs and the nursing notes.  Pertinent labs & imaging results that were available during my care of the patient were reviewed by me and considered in my medical decision making (see chart for details).      Final Clinical Impressions(s) / ED Diagnoses   Final diagnoses:  Candidal intertrigo  At this time, uncomplicated candidal rash. Erythema and small amount of maceration. Moderate but not severe. No scrotal edema associated. We'll treat with clotrimazole.  New Prescriptions New Prescriptions   CLOTRIMAZOLE (LOTRIMIN) 1 % CREAM    Apply to affected area 2 times daily     Charlesetta Shanks, MD 07/24/16 1108

## 2016-08-28 ENCOUNTER — Inpatient Hospital Stay (HOSPITAL_COMMUNITY): Admission: RE | Admit: 2016-08-28 | Payer: Medicare Other | Source: Ambulatory Visit

## 2016-09-02 ENCOUNTER — Ambulatory Visit: Payer: Medicare Other | Admitting: Cardiology

## 2016-09-03 ENCOUNTER — Ambulatory Visit (HOSPITAL_COMMUNITY)
Admission: RE | Admit: 2016-09-03 | Discharge: 2016-09-03 | Disposition: A | Discharge status: Home / Self Care | Payer: Medicare Other | Source: Ambulatory Visit | Attending: Cardiology | Admitting: Cardiology

## 2016-09-03 ENCOUNTER — Other Ambulatory Visit: Payer: Self-pay | Admitting: Family Medicine

## 2016-09-03 DIAGNOSIS — I6523 Occlusion and stenosis of bilateral carotid arteries: Secondary | ICD-10-CM | POA: Insufficient documentation

## 2016-09-03 DIAGNOSIS — I779 Disorder of arteries and arterioles, unspecified: Secondary | ICD-10-CM | POA: Diagnosis present

## 2016-09-03 DIAGNOSIS — I739 Peripheral vascular disease, unspecified: Secondary | ICD-10-CM

## 2016-09-03 DIAGNOSIS — I714 Abdominal aortic aneurysm, without rupture, unspecified: Secondary | ICD-10-CM

## 2016-09-07 ENCOUNTER — Ambulatory Visit: Payer: Medicare Other | Admitting: Cardiology

## 2016-09-15 ENCOUNTER — Ambulatory Visit (HOSPITAL_COMMUNITY)
Admission: RE | Admit: 2016-09-15 | Discharge: 2016-09-15 | Disposition: A | Discharge status: Home / Self Care | Payer: Medicare Other | Source: Ambulatory Visit | Attending: Cardiology | Admitting: Cardiology

## 2016-09-15 DIAGNOSIS — Z8673 Personal history of transient ischemic attack (TIA), and cerebral infarction without residual deficits: Secondary | ICD-10-CM | POA: Diagnosis not present

## 2016-09-15 DIAGNOSIS — I739 Peripheral vascular disease, unspecified: Secondary | ICD-10-CM | POA: Diagnosis not present

## 2016-09-15 DIAGNOSIS — Z95828 Presence of other vascular implants and grafts: Secondary | ICD-10-CM | POA: Diagnosis not present

## 2016-09-15 DIAGNOSIS — I10 Essential (primary) hypertension: Secondary | ICD-10-CM | POA: Insufficient documentation

## 2016-09-15 DIAGNOSIS — Z87898 Personal history of other specified conditions: Secondary | ICD-10-CM | POA: Diagnosis not present

## 2016-09-15 DIAGNOSIS — J449 Chronic obstructive pulmonary disease, unspecified: Secondary | ICD-10-CM | POA: Insufficient documentation

## 2016-09-15 DIAGNOSIS — E785 Hyperlipidemia, unspecified: Secondary | ICD-10-CM | POA: Insufficient documentation

## 2016-09-15 DIAGNOSIS — Z951 Presence of aortocoronary bypass graft: Secondary | ICD-10-CM | POA: Insufficient documentation

## 2016-09-15 DIAGNOSIS — I2510 Atherosclerotic heart disease of native coronary artery without angina pectoris: Secondary | ICD-10-CM | POA: Insufficient documentation

## 2016-09-15 DIAGNOSIS — I714 Abdominal aortic aneurysm, without rupture, unspecified: Secondary | ICD-10-CM

## 2016-09-18 ENCOUNTER — Encounter: Payer: Self-pay | Admitting: *Deleted

## 2016-10-05 DIAGNOSIS — K219 Gastro-esophageal reflux disease without esophagitis: Secondary | ICD-10-CM | POA: Diagnosis not present

## 2016-10-05 DIAGNOSIS — R0602 Shortness of breath: Secondary | ICD-10-CM | POA: Diagnosis not present

## 2016-10-05 DIAGNOSIS — I2510 Atherosclerotic heart disease of native coronary artery without angina pectoris: Secondary | ICD-10-CM | POA: Diagnosis not present

## 2016-10-05 DIAGNOSIS — Z125 Encounter for screening for malignant neoplasm of prostate: Secondary | ICD-10-CM | POA: Diagnosis not present

## 2016-10-05 DIAGNOSIS — E559 Vitamin D deficiency, unspecified: Secondary | ICD-10-CM | POA: Diagnosis not present

## 2016-10-05 DIAGNOSIS — M5442 Lumbago with sciatica, left side: Secondary | ICD-10-CM | POA: Diagnosis not present

## 2016-10-05 DIAGNOSIS — I10 Essential (primary) hypertension: Secondary | ICD-10-CM | POA: Diagnosis not present

## 2016-10-05 DIAGNOSIS — Z131 Encounter for screening for diabetes mellitus: Secondary | ICD-10-CM | POA: Diagnosis not present

## 2016-10-05 DIAGNOSIS — R600 Localized edema: Secondary | ICD-10-CM | POA: Diagnosis not present

## 2016-10-15 DIAGNOSIS — M545 Low back pain: Secondary | ICD-10-CM | POA: Diagnosis not present

## 2016-10-20 DIAGNOSIS — Z0000 Encounter for general adult medical examination without abnormal findings: Secondary | ICD-10-CM | POA: Diagnosis not present

## 2016-10-20 DIAGNOSIS — R7303 Prediabetes: Secondary | ICD-10-CM | POA: Diagnosis not present

## 2016-10-20 DIAGNOSIS — E785 Hyperlipidemia, unspecified: Secondary | ICD-10-CM | POA: Diagnosis not present

## 2016-12-06 ENCOUNTER — Emergency Department (HOSPITAL_BASED_OUTPATIENT_CLINIC_OR_DEPARTMENT_OTHER): Payer: Medicare Other

## 2016-12-06 ENCOUNTER — Emergency Department (HOSPITAL_COMMUNITY): Payer: Medicare Other

## 2016-12-06 ENCOUNTER — Emergency Department (HOSPITAL_COMMUNITY)
Admission: EM | Admit: 2016-12-06 | Discharge: 2016-12-06 | Disposition: A | Discharge status: Home / Self Care | Payer: Medicare Other | Attending: Emergency Medicine | Admitting: Emergency Medicine

## 2016-12-06 DIAGNOSIS — R0789 Other chest pain: Secondary | ICD-10-CM | POA: Diagnosis not present

## 2016-12-06 DIAGNOSIS — Z79899 Other long term (current) drug therapy: Secondary | ICD-10-CM | POA: Diagnosis not present

## 2016-12-06 DIAGNOSIS — Z87891 Personal history of nicotine dependence: Secondary | ICD-10-CM | POA: Insufficient documentation

## 2016-12-06 DIAGNOSIS — I10 Essential (primary) hypertension: Secondary | ICD-10-CM | POA: Diagnosis not present

## 2016-12-06 DIAGNOSIS — J449 Chronic obstructive pulmonary disease, unspecified: Secondary | ICD-10-CM | POA: Diagnosis not present

## 2016-12-06 DIAGNOSIS — Z7982 Long term (current) use of aspirin: Secondary | ICD-10-CM | POA: Diagnosis not present

## 2016-12-06 DIAGNOSIS — R609 Edema, unspecified: Secondary | ICD-10-CM | POA: Diagnosis not present

## 2016-12-06 DIAGNOSIS — Z951 Presence of aortocoronary bypass graft: Secondary | ICD-10-CM | POA: Insufficient documentation

## 2016-12-06 DIAGNOSIS — I739 Peripheral vascular disease, unspecified: Secondary | ICD-10-CM | POA: Diagnosis not present

## 2016-12-06 DIAGNOSIS — R079 Chest pain, unspecified: Secondary | ICD-10-CM | POA: Insufficient documentation

## 2016-12-06 DIAGNOSIS — I2510 Atherosclerotic heart disease of native coronary artery without angina pectoris: Secondary | ICD-10-CM | POA: Insufficient documentation

## 2016-12-06 DIAGNOSIS — B356 Tinea cruris: Secondary | ICD-10-CM | POA: Insufficient documentation

## 2016-12-06 DIAGNOSIS — R208 Other disturbances of skin sensation: Secondary | ICD-10-CM | POA: Diagnosis present

## 2016-12-06 DIAGNOSIS — G894 Chronic pain syndrome: Secondary | ICD-10-CM | POA: Insufficient documentation

## 2016-12-06 LAB — I-STAT TROPONIN, ED
TROPONIN I, POC: 0 ng/mL (ref 0.00–0.08)
Troponin i, poc: 0 ng/mL (ref 0.00–0.08)

## 2016-12-06 LAB — URINALYSIS, ROUTINE W REFLEX MICROSCOPIC
Bilirubin Urine: NEGATIVE
Glucose, UA: NEGATIVE mg/dL
Hgb urine dipstick: NEGATIVE
KETONES UR: NEGATIVE mg/dL
LEUKOCYTES UA: NEGATIVE
NITRITE: NEGATIVE
PH: 6 (ref 5.0–8.0)
PROTEIN: NEGATIVE mg/dL
Specific Gravity, Urine: 1.012 (ref 1.005–1.030)

## 2016-12-06 LAB — BASIC METABOLIC PANEL
ANION GAP: 9 (ref 5–15)
BUN: 6 mg/dL (ref 6–20)
CALCIUM: 8.9 mg/dL (ref 8.9–10.3)
CHLORIDE: 104 mmol/L (ref 101–111)
CO2: 24 mmol/L (ref 22–32)
Creatinine, Ser: 1.17 mg/dL (ref 0.61–1.24)
GFR calc non Af Amer: >60 mL/min (ref >60)
Glucose, Bld: 110 mg/dL — ABNORMAL HIGH (ref 65–99)
Potassium: 4.1 mmol/L (ref 3.5–5.1)
SODIUM: 137 mmol/L (ref 135–145)

## 2016-12-06 LAB — CBC
HCT: 52.2 % — ABNORMAL HIGH (ref 39.0–52.0)
HEMOGLOBIN: 17.4 g/dL — AB (ref 13.0–17.0)
MCH: 32.9 pg (ref 26.0–34.0)
MCHC: 33.3 g/dL (ref 30.0–36.0)
MCV: 98.7 fL (ref 78.0–100.0)
Platelets: 174 10*3/uL (ref 150–400)
RBC: 5.29 MIL/uL (ref 4.22–5.81)
RDW: 12.8 % (ref 11.5–15.5)
WBC: 5.5 10*3/uL (ref 4.0–10.5)

## 2016-12-06 LAB — D-DIMER, QUANTITATIVE (NOT AT ARMC): D DIMER QUANT: 3.9 ug{FEU}/mL — AB (ref 0.00–0.50)

## 2016-12-06 MED ORDER — CLOTRIMAZOLE 1 % EX CREA: TOPICAL_CREAM | CUTANEOUS | 0 refills | Status: DC

## 2016-12-06 NOTE — Consult Note (Signed)
Medical Consultation   TOBIN WITUCKI  VPX:[id number]  DOB: [birth year]  DOA: 12/06/2016  PCP: Benito Mccreedy, MD   Outpatient Specialists: Cardiology, but greater than 1 year since last visit   Requesting physician: Winfred Leeds  Reason for consultation: Claudication   History of Present Illness: Chris Payne is an 66 y.o. male with a history of CAD (s/p CABG, PCI), CVA, PAD, HTN, COPD, AAA (s/p repair), who presents complaining of a few weeks bilateral tingling/burning in his toes. Says has happened in the past. Gabapentin is on med list, but patient doesn't think he is currently being treated for this. Also multi-year history of pain in calves with walking relieved with rest. Does not cite an lesions on feet. Denies chest pain or change in baseline shortness of breath or exercise tolerance. Has f/u appt with PCP scheduled. Exertional calf pain has not worsened recently. Denies recent med changes. No abdominal pain. Diet is normal. No vomiting or diarrhea. Burning/tingling in legs worse at night.      Review of Systems:  ROS As per HPI otherwise 10 point review of systems negative.     Past Medical History: Past Medical History:  Diagnosis Date  . AAA (abdominal aortic aneurysm) ([place])    s/p repair 6/11  . CAD (coronary artery disease)    s/p CABG 2/12:  LIMA to LAD, SVG to diagonal-1, SVG to ramus intermedius,, SVG to AM (Dr. Roxan Hockey) ;  b.  Myoview 10/13:  inf infarct with very mild peri-infarct ischemia, EF 49%, inf HK; c  He has severe three-vessel native coronary artery disease. Catheterization March 2015 as above  . CAP (community acquired pneumonia) 02/23/2014  . Carotid stenosis    a.  dopplers 4/62: LICA 70-35%;  b. Carotid dopplers 3/14:  R 0-39%, L 60-79%, repeat 6 mos  . COPD (chronic obstructive pulmonary disease) ([place])   . GERD (gastroesophageal reflux disease)   . Headache(784.0)   . Hyperlipidemia   . Hypertension   . Hypoxia  02/23/2014  . Inguinal hernia   . Myocardial infarction    PCI x3  . PAD (peripheral artery disease) ([place])    ABIs 4/12:  R 0.88, L 0.92; R SFA 40%, L CFA 50%, L SFA 50-60%  . Pneumonia    hx  . Stroke [facility])    h/o pontine CVA  . Thyroid nodule    incidental finding on carotid doppler 3/14 => thyroid U/S ordered    Past Surgical History: Past Surgical History:  Procedure Laterality Date  . ABDOMINAL AORTIC ANEURYSM REPAIR  10-03-2009  . CORONARY ARTERY BYPASS GRAFT  06/24/2010   LIMA to the LAD, SVG to first diagonal, SVG to ramus intermediate, SVG to acute marginal. EF 50%. 2/12  . EXPLORATION POST OPERATIVE OPEN HEART    . EXPLORATORY LAPAROTOMY  09/2009   ligation of lumbar arteries  . EXTERNAL EAR SURGERY Left    laceration child  . HIP SURGERY  40 years ago   left hip bone removal and pinning  . INGUINAL HERNIA REPAIR Left 07/21/2012   Procedure: HERNIA REPAIR INGUINAL ADULT;  Surgeon: Merrie Roof, MD;  Location: [place];  Service: General;  Laterality: Left;  . INSERTION OF MESH Left 07/21/2012   Procedure: INSERTION OF MESH;  Surgeon: Merrie Roof, MD;  Location: [place];  Service: General;  Laterality: Left;  . LEFT HEART CATHETERIZATION WITH CORONARY/GRAFT ANGIOGRAM  06/21/2012   Procedure: LEFT HEART CATHETERIZATION WITH Beatrix Fetters;  Surgeon: Minus Breeding, MD;  Location[facility];  Service: Cardiovascular;;  . LEFT HEART CATHETERIZATION WITH CORONARY/GRAFT ANGIOGRAM N/A 07/04/2013   Procedure: LEFT HEART CATHETERIZATION WITH Beatrix Fetters;  Surgeon: Sinclair Grooms, MD;  Location: [facility] CATH LAB;  Service: Cardiovascular;  Laterality: N/A;  . NM MYOCAR PERF WALL MOTION  12/02/2010   inferoapical defect is fixed c/w prior infarction/scar, there is minimal borderzone ischemia.     Allergies:  No Known Allergies   Social History:  reports that he quit smoking about 6 years ago. His smoking use included Cigarettes. He has a 30.00 pack-year  smoking history. He has never used smokeless tobacco. He reports that he does not drink alcohol or use drugs.   Family History: Family History  Problem Relation Age of Onset  . Alcohol abuse Father   . Cancer Sister 59       unknown primary  . Diabetes Mother   . Hypertension Mother   . Cancer Sister 29       kidney cancer      Physical Exam: Vitals:   12/06/16 0915 12/06/16 0930 12/06/16 0945 12/06/16 1000  BP: 139/87 137/86 (!) 143/82 136/76  Pulse: 77 72  69  Resp: 15 16  16   Temp:      TempSrc:      SpO2: 96% 98%  94%    Constitutional: Appearance,  Alert and awake, oriented x3, not in any acute distress. ENMT: mmm Neck: supple CVS: faint heart sounds, mild systolic murmur. Mild pitting edema right leg, moderate on left to lower calf. Right DP is palpable, left is not. Extremities are warm. Respiratory:  clear to auscultation bilaterally save for mild rales b/l bases Abdomen: soft nontender, nondistended, normal bowel sounds, no hepatosplenomegaly, no hernias  Musculoskeletal: : no cyanosis, clubbing Neuro: equal movements bilaterally Psych: judgement and insight appear normal, stable mood and affect, mental status Skin: no rashes or lesions or ulcers, no induration or nodules  Extremities: no calf pain or swelling or palpable cords or tenderness.   Data reviewed:  I have personally reviewed following labs and imaging studies Labs:  CBC:  Recent Labs Lab 12/06/16 0921  WBC 5.5  HGB 17.4*  HCT 52.2*  MCV 98.7  PLT 366    Basic Metabolic Panel:  Recent Labs Lab 12/06/16 0921  NA 137  K 4.1  CL 104  CO2 24  GLUCOSE 110*  BUN 6  CREATININE 1.17  CALCIUM 8.9   GFR CrCl cannot be calculated (Unknown ideal weight.). Liver Function Tests: No results for input(s): AST, ALT, ALKPHOS, BILITOT, PROT, ALBUMIN in the last 168 hours. No results for input(s): LIPASE, AMYLASE in the last 168 hours. No results for input(s): AMMONIA in the last 168  hours. Coagulation profile No results for input(s): INR, PROTIME in the last 168 hours.  Cardiac Enzymes: No results for input(s): CKTOTAL, CKMB, CKMBINDEX, TROPONINI in the last 168 hours. BNP: Invalid input(s): POCBNP CBG: No results for input(s): GLUCAP in the last 168 hours. D-Dimer  Recent Labs  12/06/16 0923  DDIMER 3.90*   Hgb A1c No results for input(s): HGBA1C in the last 72 hours. Lipid Profile No results for input(s): CHOL, HDL, LDLCALC, TRIG, CHOLHDL, LDLDIRECT in the last 72 hours. Thyroid function studies No results for input(s): TSH, T4TOTAL, T3FREE, THYROIDAB in the last 72 hours.  Invalid input(s): FREET3 Anemia work up No results for input(s): VITAMINB12, FOLATE, FERRITIN,  TIBC, IRON, RETICCTPCT in the last 72 hours. Urinalysis    Component Value Date/Time   COLORURINE YELLOW 12/06/2016 0920   APPEARANCEUR CLEAR 12/06/2016 0920   LABSPEC 1.012 12/06/2016 0920   PHURINE 6.0 12/06/2016 0920   GLUCOSEU NEGATIVE 12/06/2016 0920   HGBUR NEGATIVE 12/06/2016 0920   BILIRUBINUR NEGATIVE 12/06/2016 0920   BILIRUBINUR small 08/23/2013 1736   KETONESUR NEGATIVE 12/06/2016 0920   PROTEINUR NEGATIVE 12/06/2016 0920   UROBILINOGEN 1.0 02/08/2015 1902   NITRITE NEGATIVE 12/06/2016 0920   LEUKOCYTESUR NEGATIVE 12/06/2016 0920     Microbiology No results found for this or any previous visit (from the past 240 hour(s)).     Inpatient Medications:   Scheduled Meds: Continuous Infusions:   Radiological Exams on Admission: Dg Chest 2 View  Result Date: 12/06/2016 CLINICAL DATA:  Chest pain for 6 months EXAM: CHEST  2 VIEW COMPARISON:  04/16/2015 FINDINGS: Postoperative changes from CABG. Mild cardiomegaly. Low volumes. Bibasilar atelectasis. Normal vascularity. No pneumothorax or pleural effusion. IMPRESSION: Bibasilar atelectasis. Electronically Signed   By: Marybelle Killings M.D.   On: 12/06/2016 09:51    Impression/Recommendations  # Peripheral neuropathy:  symptoms consistent. Currently taking gabapentin but not helping much. That symptoms worse at night could also suggest component of restless leg.  - recommend outpatient pcp f/u to explore further treatment modalities  # Claudication - significant history cardiovascular disease and symptoms consistent with claudication. Symptoms not worsening, extremities warm, DP palpable in right foot and per ED doppler pulses obtained bilaterally, no ulcers noted, so do not think needs urgent arterial studies. Recommending PCP f/u to obtain arterial studies.  # Elevated d-dimer - no new pulmonary symptoms, no tachypnea or hypoxia. Exam of lower extremities not suggestive of DVT. ED checking PVLs to evaluate for occult DVT. If negative think safe to discharge home w/ appropriate precautions.   Thank you for this consultation.  Our [facility] hospitalist team will follow the patient with you.   Time Spent: 60 minutes  Desma Maxim M.D. [facility] 12/06/2016, 10:32 AM

## 2016-12-06 NOTE — Discharge Instructions (Signed)
An appointment has been scheduled for you at [facility] health heart care for 12/25/2016 at 9:30 AM. Take these instructions with you to the appointment. If you're unable to make that appointment call to change the time. Return if concern for any reason. Apply the cream to the rash in your groin as directed. If rash is not improved after 2-3 weeks, see your primary care physician. Return if concern for any reason

## 2016-12-06 NOTE — ED Triage Notes (Addendum)
Per EMS- 6 months of chest pain off and on, started again today. Pt also reports shortness of breath. Bilateral foot pain. Pt also states his "groin is raw". Given 324 Asprin and 1 nitro by EMS. No IV access. Pain from 10/10 to 5/10.  Pt states "Its a 5/10 now."

## 2016-12-06 NOTE — ED Provider Notes (Signed)
[facility] DEPT Provider Note   CSN: [id number] Arrival date & time: 12/06/16  0907  Patient is vague historian   History   Chief Complaint Chief Complaint  Patient presents with  . Chest Pain  Chief complaint leg burning.  HPI Chris Payne is a 66 y.o. male. Complains of bilateral calves burning worse with walking improved with remaining still for one week. He has bilateral burning sensation which is constant at soles of both feet for several months. He also complains of chest pain which is intermittent for 6 months which she gets approximately every other day at the anterior chest. He was treated with one sublingual nitroglycerin prior to coming here. He has no chest pain presently. Other associated symptoms include a burning pruritic rash in groin for several weeks and burning in his penis with urination for several weeks. No fever. EMS treated patient with one sublingual nitroglycerin prior to arrival and aspirin 324 mg orally.  HPI  Past Medical History:  Diagnosis Date  . AAA (abdominal aortic aneurysm) ([place])    s/p repair 6/11  . CAD (coronary artery disease)    s/p CABG 2/12:  LIMA to LAD, SVG to diagonal-1, SVG to ramus intermedius,, SVG to AM (Dr. Roxan Hockey) ;  b.  Myoview 10/13:  inf infarct with very mild peri-infarct ischemia, EF 49%, inf HK; c  He has severe three-vessel native coronary artery disease. Catheterization March 2015 as above  . CAP (community acquired pneumonia) 02/23/2014  . Carotid stenosis    a.  dopplers 7/82: LICA 42-35%;  b. Carotid dopplers 3/14:  R 0-39%, L 60-79%, repeat 6 mos  . COPD (chronic obstructive pulmonary disease) ([place])   . GERD (gastroesophageal reflux disease)   . Headache(784.0)   . Hyperlipidemia   . Hypertension   . Hypoxia 02/23/2014  . Inguinal hernia   . Myocardial infarction    PCI x3  . PAD (peripheral artery disease) ([place])    ABIs 4/12:  R 0.88, L 0.92; R SFA 40%, L CFA 50%, L SFA 50-60%  . Pneumonia    hx  .  Stroke [facility])    h/o pontine CVA  . Thyroid nodule    incidental finding on carotid doppler 3/14 => thyroid U/S ordered    Patient Active Problem List   Diagnosis Date Noted  . Chronic pain syndrome 07/19/2015  . Degenerative disc disease, lumbar 07/19/2015  . Weakness 04/17/2015  . Generalized weakness 04/17/2015  . Polycythemia 04/17/2015  . BPH (benign prostatic hyperplasia) 04/17/2015  . Seizure disorder ([place]) 10/12/2014  . TIA (transient ischemic attack) 08/23/2013  . Other and unspecified angina pectoris 07/04/2013  . Nontoxic multinodular goiter 10/07/2012  . Unstable angina ([place]) 06/19/2012  . Carotid stenosis   . PAD (peripheral artery disease) ([place])   . Left inguinal hernia 05/30/2012  . COPD (chronic obstructive pulmonary disease) ([place]) 08/24/2011  . S/P AAA repair 08/24/2011  . Lumbar disc disease   . Hypertensive heart disease   . Hyperlipidemia   . CAD (coronary artery disease)   . History of pontine CVA   . GERD (gastroesophageal reflux disease)     Past Surgical History:  Procedure Laterality Date  . ABDOMINAL AORTIC ANEURYSM REPAIR  10-03-2009  . CORONARY ARTERY BYPASS GRAFT  06/24/2010   LIMA to the LAD, SVG to first diagonal, SVG to ramus intermediate, SVG to acute marginal. EF 50%. 2/12  . EXPLORATION POST OPERATIVE OPEN HEART    . EXPLORATORY LAPAROTOMY  09/2009   ligation  of lumbar arteries  . EXTERNAL EAR SURGERY Left    laceration child  . HIP SURGERY  40 years ago   left hip bone removal and pinning  . INGUINAL HERNIA REPAIR Left 07/21/2012   Procedure: HERNIA REPAIR INGUINAL ADULT;  Surgeon: Merrie Roof, MD;  Location: [place];  Service: General;  Laterality: Left;  . INSERTION OF MESH Left 07/21/2012   Procedure: INSERTION OF MESH;  Surgeon: Merrie Roof, MD;  Location: [place];  Service: General;  Laterality: Left;  . LEFT HEART CATHETERIZATION WITH CORONARY/GRAFT ANGIOGRAM  06/21/2012   Procedure: LEFT HEART CATHETERIZATION WITH Beatrix Fetters;  Surgeon: Minus Breeding, MD;  Location: [facility] CATH LAB;  Service: Cardiovascular;;  . LEFT HEART CATHETERIZATION WITH CORONARY/GRAFT ANGIOGRAM N/A 07/04/2013   Procedure: LEFT HEART CATHETERIZATION WITH Beatrix Fetters;  Surgeon: Sinclair Grooms, MD;  Location: [facility] CATH LAB;  Service: Cardiovascular;  Laterality: N/A;  . NM MYOCAR PERF WALL MOTION  12/02/2010   inferoapical defect is fixed c/w prior infarction/scar, there is minimal borderzone ischemia.       Home Medications    Prior to Admission medications   Medication Sig Start Date End Date Taking? Authorizing Provider  aspirin EC 81 MG tablet Take 81 mg by mouth every morning.     [provider]  atorvastatin (LIPITOR) 80 MG tablet Take by mouth.    [provider]  b complex vitamins tablet Take by mouth.    [provider]  clopidogrel (PLAVIX) 75 MG tablet Take 1 tablet (75 mg total) by mouth daily with breakfast. Patient not taking: Reported on 06/12/2015 09/23/13   Bernadene Bell, MD  clotrimazole (LOTRIMIN) 1 % cream Apply to affected area 2 times daily 07/24/16   Charlesetta Shanks, MD  docusate sodium (COLACE) 100 MG capsule Take by mouth.    [provider]  fenofibrate 54 MG tablet Take by mouth. Reported on 06/25/2015    [provider]  finasteride (PROSCAR) 5 MG tablet Reported on 06/12/2015 05/11/15   [provider]  furosemide (LASIX) 20 MG tablet Take 1 tablet (20 mg total) by mouth daily. 07/16/15   Minus Breeding, MD  gabapentin (NEURONTIN) 100 MG capsule Take 1 capsule (100 mg total) by mouth 3 (three) times daily. Patient not taking: Reported on 06/12/2015 05/24/15   Wardell Honour, MD  HYDROcodone-acetaminophen [facility]) 10-325 MG tablet Take 1 tablet by mouth 2 (two) times daily as needed for severe pain. 05/24/15   Wardell Honour, MD  isosorbide mononitrate (IMDUR) 120 MG 24 hr tablet Take 1 tablet (120 mg total) by mouth daily. PLEASE CONTACT OFFICE  FOR ADDITIONAL REFILLS 09/16/15   Minus Breeding, MD  levETIRAcetam (KEPPRA) 500 MG tablet TAKE ONE TABLET BY MOUTH TWICE DAILY 09/14/15   Wardell Honour, MD  morphine (MS CONTIN) 30 MG 12 hr tablet Take 1 tablet (30 mg total) by mouth every 12 (twelve) hours. 05/24/15   Wardell Honour, MD  nitroGLYCERIN (NITROSTAT) 0.4 MG SL tablet Place 0.4 mg under the tongue every 5 (five) minutes as needed for chest pain.  06/01/13   Minus Breeding, MD  ranolazine (RANEXA) 1000 MG SR tablet Take 1 tablet (1,000 mg total) by mouth 2 (two) times daily. Please schedule appointment for refills. 07/12/15   Minus Breeding, MD  rosuvastatin (CRESTOR) 40 MG tablet Take 1 tablet (40 mg total) by mouth daily. Please schedule appointment for refills. 01/14/16   Minus Breeding, MD  tamsulosin (FLOMAX) 0.4 MG CAPS capsule Take 0.4 mg by mouth at bedtime.     [provider]    Family History Family History  Problem Relation Age of Onset  . Alcohol abuse Father   . Cancer Sister 70       unknown primary  . Diabetes Mother   . Hypertension Mother   . Cancer Sister 34       kidney cancer    Social History Social History  Substance Use Topics  . Smoking status: Former Smoker    Packs/day: 1.00    Years: 30.00    Types: Cigarettes    Quit date: 01/25/2010  . Smokeless tobacco: Never Used  . Alcohol use No     Comment: former drinker - Quit 2011.     Allergies   Patient has no known allergies.   Review of Systems Review of Systems  Cardiovascular: Positive for chest pain.  Genitourinary: Positive for dysuria.  Musculoskeletal: Positive for myalgias.  All other systems reviewed and are negative.    Physical Exam Updated Vital Signs BP 133/79   Pulse 76   Temp 97.8 F (36.6 C) (Oral)   Resp 18   SpO2 96%   Physical Exam  Constitutional: He appears well-developed and well-nourished. No distress.  HENT:  Head: Normocephalic and atraumatic.  Eyes: Pupils are equal, round, and reactive  to light. Conjunctivae are normal.  Neck: Neck supple. No tracheal deviation present. No thyromegaly present.  Cardiovascular: Normal rate and regular rhythm.   No murmur heard. Pulmonary/Chest: Effort normal and breath sounds normal.  Abdominal: Soft. Bowel sounds are normal. He exhibits no distension. There is no tenderness.  Genitourinary: Penis normal.  Musculoskeletal: Normal range of motion. He exhibits no edema or tenderness.  Left lower extremity with trace pretibial pitting edema right lower extremity without edema. DP and PT pulses obtainable by Doppler only on bilateral lower extremities  Neurological: He is alert. Coordination normal.  Skin: Skin is warm and dry. Rash noted.  Pinkish rash around scrotum at inguinal creases bilaterally consistent with tinea cruris  Psychiatric: He has a normal mood and affect.  Nursing note and vitals reviewed.    ED Treatments / Results  Labs (all labs ordered are listed, but only abnormal results are displayed) Labs Reviewed  BASIC METABOLIC PANEL  CBC  URINALYSIS, DIPSTICK ONLY  URINALYSIS, ROUTINE W REFLEX MICROSCOPIC  D-DIMER, QUANTITATIVE (NOT AT [facility])  I-STAT TROPONIN, ED   Chest x-ray viewed by me Results for orders placed or performed during the hospital encounter of 73/41/93  Basic metabolic panel  Result Value Ref Range   Sodium 137 135 - 145 mmol/L   Potassium 4.1 3.5 - 5.1 mmol/L   Chloride 104 101 - 111 mmol/L   CO2 24 22 - 32 mmol/L   Glucose, Bld 110 (H) 65 - 99 mg/dL   BUN 6 6 - 20 mg/dL   Creatinine, Ser 1.17 0.61 - 1.24 mg/dL   Calcium 8.9 8.9 - 10.3 mg/dL   GFR calc non Af Amer >60 >60 mL/min   GFR calc Af Amer >60 >60 mL/min   Anion gap 9 5 - 15  CBC  Result Value Ref Range   WBC 5.5 4.0 - 10.5 K/uL   RBC 5.29 4.22 - 5.81 MIL/uL   Hemoglobin 17.4 (H) 13.0 - 17.0 g/dL   HCT 52.2 (H) 39.0 - 52.0 %   MCV 98.7 78.0 - 100.0 fL   MCH 32.9 26.0 - 34.0 pg  MCHC 33.3 30.0 - 36.0 g/dL   RDW 12.8 11.5 - 15.5 %    Platelets 174 150 - 400 K/uL  Urinalysis, Routine w reflex microscopic  Result Value Ref Range   Color, Urine YELLOW YELLOW   APPearance CLEAR CLEAR   Specific Gravity, Urine 1.012 1.005 - 1.030   pH 6.0 5.0 - 8.0   Glucose, UA NEGATIVE NEGATIVE mg/dL   Hgb urine dipstick NEGATIVE NEGATIVE   Bilirubin Urine NEGATIVE NEGATIVE   Ketones, ur NEGATIVE NEGATIVE mg/dL   Protein, ur NEGATIVE NEGATIVE mg/dL   Nitrite NEGATIVE NEGATIVE   Leukocytes, UA NEGATIVE NEGATIVE  D-dimer, quantitative (not at [facility])  Result Value Ref Range   D-Dimer, Quant 3.90 (H) 0.00 - 0.50 ug/mL-FEU  I-stat troponin, ED  Result Value Ref Range   Troponin i, poc 0.00 0.00 - 0.08 ng/mL   Comment 3          I-stat troponin, ED  Result Value Ref Range   Troponin i, poc 0.00 0.00 - 0.08 ng/mL   Comment 3           Dg Chest 2 View  Result Date: 12/06/2016 CLINICAL DATA:  Chest pain for 6 months EXAM: CHEST  2 VIEW COMPARISON:  04/16/2015 FINDINGS: Postoperative changes from CABG. Mild cardiomegaly. Low volumes. Bibasilar atelectasis. Normal vascularity. No pneumothorax or pleural effusion. IMPRESSION: Bibasilar atelectasis. Electronically Signed   By: Marybelle Killings M.D.   On: 12/06/2016 09:51   EKG  EKG Interpretation  Date/Time:  Sunday December 06 2016 09:12:31 EDT Ventricular Rate:  75 PR Interval:    QRS Duration: 110 QT Interval:  404 QTC Calculation: 452 R Axis:   -26 Text Interpretation:  Sinus rhythm Borderline left axis deviation Low voltage, precordial leads No significant change since last tracing Confirmed by Orlie Dakin [phone]) on 12/06/2016 9:37:51 AM       Radiology No results found.  Procedures Procedures (including critical care time)  Medications Ordered in ED Medications - No data to display  Hospital service evaluated patient in ED and is in agreement the patient does not require inpatient hospitalization. Patient suffering from claudication in his legs. No evidence of DVT.  Chest pain may be anginal but is chronic and unchanged change and not his primary reason for coming today. He'll be prescribed Lotrimin cream for tinea cruris. I spoke with Dr. Meda Coffee from [facility] who will arrange for close outpatient follow-up and arterial studies of his legs. Initial Impression / Assessment and Plan / ED Course  I have reviewed the triage vital signs and the nursing notes.  Pertinent labs & imaging results that were available during my care of the patient were reviewed by me and considered in my medical decision making (see chart for details).       Final Clinical Impressions(s) / ED Diagnoses  Diagnosis #1 chronic chest pain #2 claudication #3 tinea cruris Final diagnoses:  None    New Prescriptions New Prescriptions   No medications on file     Orlie Dakin, MD 12/06/16 1416

## 2016-12-06 NOTE — Progress Notes (Signed)
\  VASCULAR LAB PRELIMINARY  PRELIMINARY  PRELIMINARY  PRELIMINARY  Bilateral lower extremity venous duplex completed.    Preliminary report:  There is no DVT or SVT noted in the bilateral lower extremities.    Gave results to Dr. Baldemar Lenis, [facility], RVT 12/06/2016, 12:28 PM

## 2016-12-25 ENCOUNTER — Ambulatory Visit: Payer: Medicare Other | Admitting: Physician Assistant

## 2017-02-19 DIAGNOSIS — I2510 Atherosclerotic heart disease of native coronary artery without angina pectoris: Secondary | ICD-10-CM | POA: Diagnosis not present

## 2017-02-19 DIAGNOSIS — Z131 Encounter for screening for diabetes mellitus: Secondary | ICD-10-CM | POA: Diagnosis not present

## 2017-02-19 DIAGNOSIS — E785 Hyperlipidemia, unspecified: Secondary | ICD-10-CM | POA: Diagnosis not present

## 2017-02-19 DIAGNOSIS — R7303 Prediabetes: Secondary | ICD-10-CM | POA: Diagnosis not present

## 2017-02-19 DIAGNOSIS — Z0110 Encounter for examination of ears and hearing without abnormal findings: Secondary | ICD-10-CM | POA: Diagnosis not present

## 2017-02-19 DIAGNOSIS — I10 Essential (primary) hypertension: Secondary | ICD-10-CM | POA: Diagnosis not present

## 2017-02-19 DIAGNOSIS — E559 Vitamin D deficiency, unspecified: Secondary | ICD-10-CM | POA: Diagnosis not present

## 2017-02-19 DIAGNOSIS — Z23 Encounter for immunization: Secondary | ICD-10-CM | POA: Diagnosis not present

## 2017-02-26 DIAGNOSIS — H25811 Combined forms of age-related cataract, right eye: Secondary | ICD-10-CM | POA: Diagnosis not present

## 2017-02-26 DIAGNOSIS — H25812 Combined forms of age-related cataract, left eye: Secondary | ICD-10-CM | POA: Diagnosis not present

## 2017-05-20 ENCOUNTER — Emergency Department (HOSPITAL_COMMUNITY)
Admission: EM | Admit: 2017-05-20 | Discharge: 2017-05-20 | Disposition: A | Discharge status: Home / Self Care | Payer: Medicare HMO | Attending: Emergency Medicine | Admitting: Emergency Medicine

## 2017-05-20 ENCOUNTER — Other Ambulatory Visit: Payer: Self-pay

## 2017-05-20 ENCOUNTER — Encounter (HOSPITAL_COMMUNITY): Payer: Self-pay | Admitting: Emergency Medicine

## 2017-05-20 ENCOUNTER — Emergency Department (HOSPITAL_COMMUNITY): Payer: Medicare HMO

## 2017-05-20 DIAGNOSIS — I10 Essential (primary) hypertension: Secondary | ICD-10-CM | POA: Insufficient documentation

## 2017-05-20 DIAGNOSIS — Z7982 Long term (current) use of aspirin: Secondary | ICD-10-CM | POA: Diagnosis not present

## 2017-05-20 DIAGNOSIS — R072 Precordial pain: Secondary | ICD-10-CM | POA: Insufficient documentation

## 2017-05-20 DIAGNOSIS — I2510 Atherosclerotic heart disease of native coronary artery without angina pectoris: Secondary | ICD-10-CM | POA: Diagnosis not present

## 2017-05-20 DIAGNOSIS — B369 Superficial mycosis, unspecified: Secondary | ICD-10-CM

## 2017-05-20 DIAGNOSIS — J449 Chronic obstructive pulmonary disease, unspecified: Secondary | ICD-10-CM | POA: Insufficient documentation

## 2017-05-20 DIAGNOSIS — Z951 Presence of aortocoronary bypass graft: Secondary | ICD-10-CM | POA: Diagnosis not present

## 2017-05-20 DIAGNOSIS — L308 Other specified dermatitis: Secondary | ICD-10-CM | POA: Diagnosis not present

## 2017-05-20 DIAGNOSIS — Z79899 Other long term (current) drug therapy: Secondary | ICD-10-CM | POA: Insufficient documentation

## 2017-05-20 DIAGNOSIS — Z8673 Personal history of transient ischemic attack (TIA), and cerebral infarction without residual deficits: Secondary | ICD-10-CM | POA: Insufficient documentation

## 2017-05-20 DIAGNOSIS — K439 Ventral hernia without obstruction or gangrene: Secondary | ICD-10-CM | POA: Insufficient documentation

## 2017-05-20 DIAGNOSIS — Z87891 Personal history of nicotine dependence: Secondary | ICD-10-CM | POA: Diagnosis not present

## 2017-05-20 DIAGNOSIS — R079 Chest pain, unspecified: Secondary | ICD-10-CM | POA: Diagnosis not present

## 2017-05-20 DIAGNOSIS — R0789 Other chest pain: Secondary | ICD-10-CM | POA: Diagnosis present

## 2017-05-20 LAB — CBC
HCT: 57.6 % — ABNORMAL HIGH (ref 39.0–52.0)
HEMOGLOBIN: 19.3 g/dL — AB (ref 13.0–17.0)
MCH: 33.7 pg (ref 26.0–34.0)
MCHC: 33.5 g/dL (ref 30.0–36.0)
MCV: 100.7 fL — ABNORMAL HIGH (ref 78.0–100.0)
Platelets: 179 10*3/uL (ref 150–400)
RBC: 5.72 MIL/uL (ref 4.22–5.81)
RDW: 13.2 % (ref 11.5–15.5)
WBC: 6.9 10*3/uL (ref 4.0–10.5)

## 2017-05-20 LAB — I-STAT TROPONIN, ED
TROPONIN I, POC: 0 ng/mL (ref 0.00–0.08)
Troponin i, poc: 0 ng/mL (ref 0.00–0.08)

## 2017-05-20 LAB — BASIC METABOLIC PANEL
Anion gap: 17 — ABNORMAL HIGH (ref 5–15)
BUN: 9 mg/dL (ref 6–20)
CO2: 20 mmol/L — ABNORMAL LOW (ref 22–32)
Calcium: 9.4 mg/dL (ref 8.9–10.3)
Chloride: 103 mmol/L (ref 101–111)
Creatinine, Ser: 1.13 mg/dL (ref 0.61–1.24)
GFR calc Af Amer: >60 mL/min (ref >60)
GFR calc non Af Amer: >60 mL/min (ref >60)
Glucose, Bld: 117 mg/dL — ABNORMAL HIGH (ref 65–99)
Potassium: 4.3 mmol/L (ref 3.5–5.1)
Sodium: 140 mmol/L (ref 135–145)

## 2017-05-20 MED ORDER — SODIUM CHLORIDE 0.9 % IV BOLUS (SEPSIS): 1000.0000 mL | Freq: Once | INTRAVENOUS | Status: DC

## 2017-05-20 MED ORDER — CLOTRIMAZOLE 1 % EX CREA: TOPICAL_CREAM | CUTANEOUS | 0 refills | Status: DC

## 2017-05-20 NOTE — Discharge Instructions (Signed)
It was our pleasure to provide your ER care today - we hope that you feel better.  For rash in creases of groin area, keep the area very clean and dry.  Apply thin coat of clotrimazole cream to area. Follow up with your doctor.   For chest discomfort, follow up with your cardiologist in the coming week - call office today or tomorrow AM to arrange appointment.   For hernia, follow up with general surgeon in the next few weeks - see referral - call office to arrange appointment.  Return to ER if worse, new symptoms, severe pain, trouble breathing, other concern.

## 2017-05-20 NOTE — ED Notes (Signed)
Patient is resting with call bell in reach  

## 2017-05-20 NOTE — ED Provider Notes (Addendum)
[facility] EMERGENCY DEPARTMENT Provider Note   CSN: [id number] Arrival date & time: 05/20/17  1025     History   Chief Complaint Chief Complaint  Patient presents with  . Chest Pain    HPI Chris Payne is a 67 y.o. male.  Patient c/o vague sense of mid chest tightness in past couple months. Symptoms mild, persistent, constant, at rest, no relation to exertion, not pleuritic. No associated sob, nv or diaphoresis. Hx cad/cabg 2012. States has not seen his doctor/cardiologist. Symptoms occur at rest. No relation to activity or exertion. Also notes pain to right groin, ?hernia. Denies injury or strain to area. Denies abdominal distension. +normal appetite. Is having normal bms. No nausea or vomiting.    The history is provided by the patient.  Chest Pain   Pertinent negatives include no abdominal pain, no back pain, no fever, no headaches, no shortness of breath and no vomiting.    Past Medical History:  Diagnosis Date  . AAA (abdominal aortic aneurysm) ([place])    s/p repair 6/11  . CAD (coronary artery disease)    s/p CABG 2/12:  LIMA to LAD, SVG to diagonal-1, SVG to ramus intermedius,, SVG to AM (Dr. Roxan Hockey) ;  b.  Myoview 10/13:  inf infarct with very mild peri-infarct ischemia, EF 49%, inf HK; c  He has severe three-vessel native coronary artery disease. Catheterization March 2015 as above  . CAP (community acquired pneumonia) 02/23/2014  . Carotid stenosis    a.  dopplers 2/50: LICA 53-97%;  b. Carotid dopplers 3/14:  R 0-39%, L 60-79%, repeat 6 mos  . COPD (chronic obstructive pulmonary disease) ([place])   . GERD (gastroesophageal reflux disease)   . Headache(784.0)   . Hyperlipidemia   . Hypertension   . Hypoxia 02/23/2014  . Inguinal hernia   . Myocardial infarction [facility])    PCI x3  . PAD (peripheral artery disease) ([place])    ABIs 4/12:  R 0.88, L 0.92; R SFA 40%, L CFA 50%, L SFA 50-60%  . Pneumonia    hx  . Stroke [facility])    h/o pontine CVA   . Thyroid nodule    incidental finding on carotid doppler 3/14 => thyroid U/S ordered    Patient Active Problem List   Diagnosis Date Noted  . Chronic pain syndrome 07/19/2015  . Degenerative disc disease, lumbar 07/19/2015  . Weakness 04/17/2015  . Generalized weakness 04/17/2015  . Polycythemia 04/17/2015  . BPH (benign prostatic hyperplasia) 04/17/2015  . Seizure disorder ([place]) 10/12/2014  . TIA (transient ischemic attack) 08/23/2013  . Other and unspecified angina pectoris 07/04/2013  . Nontoxic multinodular goiter 10/07/2012  . Unstable angina ([place]) 06/19/2012  . Carotid stenosis   . PAD (peripheral artery disease) ([place])   . Left inguinal hernia 05/30/2012  . COPD (chronic obstructive pulmonary disease) ([place]) 08/24/2011  . S/P AAA repair 08/24/2011  . Lumbar disc disease   . Hypertensive heart disease   . Hyperlipidemia   . CAD (coronary artery disease)   . History of pontine CVA   . GERD (gastroesophageal reflux disease)     Past Surgical History:  Procedure Laterality Date  . ABDOMINAL AORTIC ANEURYSM REPAIR  10-03-2009  . CORONARY ARTERY BYPASS GRAFT  06/24/2010   LIMA to the LAD, SVG to first diagonal, SVG to ramus intermediate, SVG to acute marginal. EF 50%. 2/12  . EXPLORATION POST OPERATIVE OPEN HEART    . EXPLORATORY LAPAROTOMY  09/2009   ligation of  lumbar arteries  . EXTERNAL EAR SURGERY Left    laceration child  . HIP SURGERY  40 years ago   left hip bone removal and pinning  . INGUINAL HERNIA REPAIR Left 07/21/2012   Procedure: HERNIA REPAIR INGUINAL ADULT;  Surgeon: Merrie Roof, MD;  Location: [facility];  Service: General;  Laterality: Left;  . INSERTION OF MESH Left 07/21/2012   Procedure: INSERTION OF MESH;  Surgeon: Merrie Roof, MD;  Location: [place];  Service: General;  Laterality: Left;  . LEFT HEART CATHETERIZATION WITH CORONARY/GRAFT ANGIOGRAM  06/21/2012   Procedure: LEFT HEART CATHETERIZATION WITH Beatrix Fetters;  Surgeon: Minus Breeding, MD;  Location: [facility] C[facility] LAB;  Service: Cardiovascular;;  . LEFT HEART CATHETERIZATION WITH CORONARY/GRAFT ANGIOGRAM N/A 07/04/2013   Procedure: LEFT HEART CATHETERIZATION WITH Beatrix Fetters;  Surgeon: Sinclair Grooms, MD;  Location: [facility] CATH LAB;  Service: Cardiovascular;  Laterality: N/A;  . NM MYOCAR PERF WALL MOTION  12/02/2010   inferoapical defect is fixed c/w prior infarction/scar, there is minimal borderzone ischemia.       Home Medications    Prior to Admission medications   Medication Sig Start Date End Date Taking? Authorizing Provider  aspirin EC 81 MG tablet Take 81 mg by mouth every morning.    Yes [provider]  gabapentin (NEURONTIN) 100 MG capsule Take 1 capsule (100 mg total) by mouth 3 (three) times daily. Patient taking differently: Take 300 mg by mouth 2 (two) times daily.  05/24/15  Yes Wardell Honour, MD  ketorolac (ACULAR) 0.4 % SOLN Place 1 drop into the right eye 4 (four) times daily.   Yes [provider]  nitroGLYCERIN (NITROSTAT) 0.4 MG SL tablet Place 0.4 mg under the tongue every 5 (five) minutes as needed for chest pain.  06/01/13  Yes Minus Breeding, MD  ofloxacin (OCUFLOX) 0.3 % ophthalmic solution Place 1 drop into the right eye 4 (four) times daily.   Yes [provider]  prednisoLONE acetate (PRED FORTE) 1 % ophthalmic suspension Place 1 drop into the right eye 4 (four) times daily.   Yes [provider]  clopidogrel (PLAVIX) 75 MG tablet Take 1 tablet (75 mg total) by mouth daily with breakfast. Patient not taking: Reported on 06/12/2015 09/23/13   Bernadene Bell, MD  clotrimazole (LOTRIMIN) 1 % cream Apply to affected area 2 times daily Patient not taking: Reported on 05/20/2017 12/06/16   Orlie Dakin, MD  furosemide (LASIX) 20 MG tablet Take 1 tablet (20 mg total) by mouth daily. Patient not taking: Reported on 05/20/2017 07/16/15   Minus Breeding, MD  HYDROcodone-acetaminophen [facility]) 10-325 MG  tablet Take 1 tablet by mouth 2 (two) times daily as needed for severe pain. Patient not taking: Reported on 05/20/2017 05/24/15   Wardell Honour, MD  isosorbide mononitrate (IMDUR) 120 MG 24 hr tablet Take 1 tablet (120 mg total) by mouth daily. PLEASE CONTACT OFFICE FOR ADDITIONAL REFILLS Patient not taking: Reported on 05/20/2017 09/16/15   Minus Breeding, MD  levETIRAcetam (KEPPRA) 500 MG tablet TAKE ONE TABLET BY MOUTH TWICE DAILY Patient not taking: Reported on 05/20/2017 09/14/15   Wardell Honour, MD  metoprolol tartrate (LOPRESSOR) 25 MG tablet Take 25 mg by mouth 2 (two) times daily.    [provider]  morphine (MS CONTIN) 30 MG 12 hr tablet Take 1 tablet (30 mg total) by mouth every 12 (twelve) hours. Patient not taking: Reported on 05/20/2017 05/24/15   Tamala Julian,  Renette Butters, MD  pantoprazole (PROTONIX) 40 MG tablet Take 40 mg by mouth daily.    [provider]  ranolazine (RANEXA) 1000 MG SR tablet Take 1 tablet (1,000 mg total) by mouth 2 (two) times daily. Please schedule appointment for refills. Patient not taking: Reported on 05/20/2017 07/12/15   Minus Breeding, MD  rosuvastatin (CRESTOR) 40 MG tablet Take 1 tablet (40 mg total) by mouth daily. Please schedule appointment for refills. Patient not taking: Reported on 05/20/2017 01/14/16   Minus Breeding, MD    Family History Family History  Problem Relation Age of Onset  . Alcohol abuse Father   . Cancer Sister 43       unknown primary  . Diabetes Mother   . Hypertension Mother   . Cancer Sister 90       kidney cancer    Social History Social History   Tobacco Use  . Smoking status: Former Smoker    Packs/day: 1.00    Years: 30.00    Pack years: 30.00    Types: Cigarettes    Last attempt to quit: 01/25/2010    Years since quitting: 7.3  . Smokeless tobacco: Never Used  Substance Use Topics  . Alcohol use: No    Comment: former drinker - Quit 2011.  . Drug use: No     Allergies   Patient has no  known allergies.   Review of Systems Review of Systems  Constitutional: Negative for fever.  HENT: Negative for sore throat.   Eyes: Negative for redness.  Respiratory: Negative for shortness of breath.   Cardiovascular: Positive for chest pain.  Gastrointestinal: Negative for abdominal pain and vomiting.  Genitourinary: Negative for flank pain.  Musculoskeletal: Negative for back pain and neck pain.  Skin: Negative for rash.  Neurological: Negative for headaches.  Hematological: Does not bruise/bleed easily.  Psychiatric/Behavioral: Negative for confusion.     Physical Exam Updated Vital Signs BP (!) 175/98   Pulse 64   Temp 98.2 F (36.8 C) (Oral)   Resp 14   Ht 1.702 m (5\' 7" )   Wt 77.1 kg (170 lb)   SpO2 98%   BMI 26.63 kg/m   Physical Exam  Constitutional: He appears well-developed and well-nourished. No distress.  HENT:  Mouth/Throat: Oropharynx is clear and moist.  Eyes: Conjunctivae are normal.  Neck: Neck supple. No tracheal deviation present. No thyromegaly present.  Cardiovascular: Normal rate, regular rhythm, normal heart sounds and intact distal pulses. Exam reveals no gallop and no friction rub.  No murmur heard. Pulmonary/Chest: Effort normal and breath sounds normal. No accessory muscle usage. No respiratory distress.  Abdominal: Soft. Bowel sounds are normal. He exhibits no distension. There is no tenderness.  Genitourinary:  Genitourinary Comments: No cva tenderness  Musculoskeletal: He exhibits no edema or tenderness.  Neurological: He is alert.  Skin: Skin is warm and dry.  In creases groin, erythematous rash c/w yeast dermatitis.   Psychiatric: He has a normal mood and affect.  Nursing note and vitals reviewed.    ED Treatments / Results  Labs (all labs ordered are listed, but only abnormal results are displayed) Results for orders placed or performed during the hospital encounter of 97/74/14  Basic metabolic panel  Result Value Ref  Range   Sodium 140 135 - 145 mmol/L   Potassium 4.3 3.5 - 5.1 mmol/L   Chloride 103 101 - 111 mmol/L   CO2 20 (L) 22 - 32 mmol/L   Glucose, Bld 117 (H) 65 -  99 mg/dL   BUN 9 6 - 20 mg/dL   Creatinine, Ser 1.13 0.61 - 1.24 mg/dL   Calcium 9.4 8.9 - 10.3 mg/dL   GFR calc non Af Amer >60 >60 mL/min   GFR calc Af Amer >60 >60 mL/min   Anion gap 17 (H) 5 - 15  CBC  Result Value Ref Range   WBC 6.9 4.0 - 10.5 K/uL   RBC 5.72 4.22 - 5.81 MIL/uL   Hemoglobin 19.3 (H) 13.0 - 17.0 g/dL   HCT 57.6 (H) 39.0 - 52.0 %   MCV 100.7 (H) 78.0 - 100.0 fL   MCH 33.7 26.0 - 34.0 pg   MCHC 33.5 30.0 - 36.0 g/dL   RDW 13.2 11.5 - 15.5 %   Platelets 179 150 - 400 K/uL  I-stat troponin, ED  Result Value Ref Range   Troponin i, poc 0.00 0.00 - 0.08 ng/mL   Comment 3          I-stat troponin, ED  Result Value Ref Range   Troponin i, poc 0.00 0.00 - 0.08 ng/mL   Comment 3           Dg Chest 2 View  Result Date: 05/20/2017 CLINICAL DATA:  Left-sided chest pain EXAM: CHEST  2 VIEW COMPARISON:  12/06/2016 FINDINGS: Previous median sternotomy and CABG procedure. Scarring identified within the left midlung and left base. The lungs appear hyperinflated but clear. Chronic interstitial coarsening of emphysema noted. IMPRESSION: 1. No acute cardiopulmonary abnormalities. 2. Emphysema Electronically Signed   By: Kerby Moors M.D.   On: 05/20/2017 11:32    EKG  EKG Interpretation  Date/Time:  Thursday May 20 2017 10:36:34 EST Ventricular Rate:  109 PR Interval:  174 QRS Duration: 90 QT Interval:  340 QTC Calculation: 457 R Axis:   6 Text Interpretation:  Sinus tachycardia Confirmed by Lajean Saver [phone]) on 05/20/2017 11:20:55 AM       Radiology Dg Chest 2 View  Result Date: 05/20/2017 CLINICAL DATA:  Left-sided chest pain EXAM: CHEST  2 VIEW COMPARISON:  12/06/2016 FINDINGS: Previous median sternotomy and CABG procedure. Scarring identified within the left midlung and left base. The lungs  appear hyperinflated but clear. Chronic interstitial coarsening of emphysema noted. IMPRESSION: 1. No acute cardiopulmonary abnormalities. 2. Emphysema Electronically Signed   By: Kerby Moors M.D.   On: 05/20/2017 11:32    Procedures Procedures (including critical care time)  Medications Ordered in ED Medications - No data to display   Initial Impression / Assessment and Plan / ED Course  I have reviewed the triage vital signs and the nursing notes.  Pertinent labs & imaging results that were available during my care of the patient were reviewed by me and considered in my medical decision making (see chart for details).  Iv ns. Continuous pulse ox and monitor. Labs. Cxr.  Reviewed nursing notes and prior charts for additional history.   1 liter ns iv bolus.  Awaiting delta troponin.  After having symptoms x months, without abrupt/acute change today, trop x 2 normal/not increasing. Patient is breathing comfortably. No sob. No current pain or discomfort. Afebrile in ED. abd is soft nt. ?spontaneously reduced inguinal and ventral hernia.   For cp in past few months, hx cad, will refer to close outpatient cardiology follow up.   For hernia, will refer to outpatient general surgery follow up.  Patient currently appears stable for d/c.     Final Clinical Impressions(s) / ED Diagnoses   Final diagnoses:  None    ED Discharge Orders    None           Lajean Saver, MD 05/20/17 1517

## 2017-05-20 NOTE — ED Triage Notes (Addendum)
Pt c/o chest pain over left chest area "for about a year on and off" and "scrotal pain for 1 month-- worser today, hernia pain"  Had hernia operation 7-8 yrs ago--  Has been taking gabapentin but still having leg and feet burning and pain.  C/o burning on urination

## 2017-05-20 NOTE — ED Notes (Signed)
ED Provider at bedside. 

## 2017-06-10 DIAGNOSIS — Z951 Presence of aortocoronary bypass graft: Secondary | ICD-10-CM | POA: Diagnosis not present

## 2017-06-10 DIAGNOSIS — R1030 Lower abdominal pain, unspecified: Secondary | ICD-10-CM | POA: Diagnosis not present

## 2017-06-10 DIAGNOSIS — I252 Old myocardial infarction: Secondary | ICD-10-CM | POA: Diagnosis not present

## 2017-06-10 DIAGNOSIS — M549 Dorsalgia, unspecified: Secondary | ICD-10-CM | POA: Diagnosis not present

## 2017-06-10 DIAGNOSIS — G8929 Other chronic pain: Secondary | ICD-10-CM | POA: Diagnosis not present

## 2017-06-11 DIAGNOSIS — H25811 Combined forms of age-related cataract, right eye: Secondary | ICD-10-CM | POA: Diagnosis not present

## 2017-06-11 DIAGNOSIS — H25812 Combined forms of age-related cataract, left eye: Secondary | ICD-10-CM | POA: Diagnosis not present

## 2017-09-03 DIAGNOSIS — H25013 Cortical age-related cataract, bilateral: Secondary | ICD-10-CM | POA: Diagnosis not present

## 2017-09-03 DIAGNOSIS — H2589 Other age-related cataract: Secondary | ICD-10-CM | POA: Diagnosis not present

## 2017-09-03 DIAGNOSIS — H25042 Posterior subcapsular polar age-related cataract, left eye: Secondary | ICD-10-CM | POA: Diagnosis not present

## 2017-09-03 DIAGNOSIS — H2513 Age-related nuclear cataract, bilateral: Secondary | ICD-10-CM | POA: Diagnosis not present

## 2017-09-03 DIAGNOSIS — H2512 Age-related nuclear cataract, left eye: Secondary | ICD-10-CM | POA: Diagnosis not present

## 2017-09-21 ENCOUNTER — Encounter: Payer: Self-pay | Admitting: Family Medicine

## 2017-09-26 ENCOUNTER — Encounter (HOSPITAL_COMMUNITY): Payer: Self-pay | Admitting: *Deleted

## 2017-09-26 ENCOUNTER — Emergency Department (HOSPITAL_COMMUNITY)
Admission: EM | Admit: 2017-09-26 | Discharge: 2017-09-26 | Disposition: A | Discharge status: Home / Self Care | Payer: Medicare Other | Attending: Emergency Medicine | Admitting: Emergency Medicine

## 2017-09-26 ENCOUNTER — Other Ambulatory Visit: Payer: Self-pay

## 2017-09-26 ENCOUNTER — Emergency Department (HOSPITAL_COMMUNITY): Payer: Medicare Other

## 2017-09-26 DIAGNOSIS — J449 Chronic obstructive pulmonary disease, unspecified: Secondary | ICD-10-CM | POA: Insufficient documentation

## 2017-09-26 DIAGNOSIS — I2510 Atherosclerotic heart disease of native coronary artery without angina pectoris: Secondary | ICD-10-CM | POA: Diagnosis not present

## 2017-09-26 DIAGNOSIS — R079 Chest pain, unspecified: Secondary | ICD-10-CM | POA: Diagnosis not present

## 2017-09-26 DIAGNOSIS — I252 Old myocardial infarction: Secondary | ICD-10-CM | POA: Insufficient documentation

## 2017-09-26 DIAGNOSIS — Z87891 Personal history of nicotine dependence: Secondary | ICD-10-CM | POA: Diagnosis not present

## 2017-09-26 DIAGNOSIS — Z7982 Long term (current) use of aspirin: Secondary | ICD-10-CM | POA: Insufficient documentation

## 2017-09-26 DIAGNOSIS — R0789 Other chest pain: Secondary | ICD-10-CM | POA: Insufficient documentation

## 2017-09-26 DIAGNOSIS — Z8673 Personal history of transient ischemic attack (TIA), and cerebral infarction without residual deficits: Secondary | ICD-10-CM | POA: Diagnosis not present

## 2017-09-26 DIAGNOSIS — Z951 Presence of aortocoronary bypass graft: Secondary | ICD-10-CM | POA: Insufficient documentation

## 2017-09-26 DIAGNOSIS — R1013 Epigastric pain: Secondary | ICD-10-CM | POA: Diagnosis not present

## 2017-09-26 DIAGNOSIS — I10 Essential (primary) hypertension: Secondary | ICD-10-CM | POA: Insufficient documentation

## 2017-09-26 DIAGNOSIS — R0902 Hypoxemia: Secondary | ICD-10-CM | POA: Diagnosis not present

## 2017-09-26 LAB — I-STAT TROPONIN, ED: TROPONIN I, POC: 0 ng/mL (ref 0.00–0.08)

## 2017-09-26 LAB — BASIC METABOLIC PANEL
ANION GAP: 10 (ref 5–15)
BUN: 8 mg/dL (ref 6–20)
CALCIUM: 8.9 mg/dL (ref 8.9–10.3)
CO2: 25 mmol/L (ref 22–32)
Chloride: 104 mmol/L (ref 101–111)
Creatinine, Ser: 1.08 mg/dL (ref 0.61–1.24)
Glucose, Bld: 105 mg/dL — ABNORMAL HIGH (ref 65–99)
POTASSIUM: 3.7 mmol/L (ref 3.5–5.1)
Sodium: 139 mmol/L (ref 135–145)

## 2017-09-26 LAB — CBC
HEMATOCRIT: 54.3 % — AB (ref 39.0–52.0)
HEMOGLOBIN: 17.5 g/dL — AB (ref 13.0–17.0)
MCH: 32.1 pg (ref 26.0–34.0)
MCHC: 32.2 g/dL (ref 30.0–36.0)
MCV: 99.6 fL (ref 78.0–100.0)
Platelets: 188 10*3/uL (ref 150–400)
RBC: 5.45 MIL/uL (ref 4.22–5.81)
RDW: 12.3 % (ref 11.5–15.5)
WBC: 6.9 10*3/uL (ref 4.0–10.5)

## 2017-09-26 LAB — TROPONIN I

## 2017-09-26 MED ORDER — ALUM & MAG HYDROXIDE-SIMETH 200-200-20 MG/5ML PO SUSP
15.0000 mL | Freq: Once | ORAL | Status: AC
Start: 2017-09-26 — End: 2017-09-26
  Administered 2017-09-26: 15 mL via ORAL
  Filled 2017-09-26: qty 30

## 2017-09-26 NOTE — ED Notes (Signed)
Pt had iv placed by ems  Lt a-c saline loked  They gave 324mg  aspirin also

## 2017-09-26 NOTE — ED Provider Notes (Signed)
[facility] EMERGENCY DEPARTMENT Provider Note   CSN: [id number] Arrival date & time: 09/26/17  1510     History   Chief Complaint Chief Complaint  Patient presents with  . Chest Pain    HPI Chris Payne is a 67 y.o. male.  67 yo M with a chief complaint of chest pain.  This is been going on for the past month.  Seems to come and go.  He is unsure what exactly makes it worse.  Describes it as a band around his upper abdomen.  He is unsure if it is in his stomach or in his chest.  He is noted that his abdomen is been a bit more distended than normal.  Has been using the bathroom normally.  Denies vomiting or nausea.  Denies fevers or chills.  Has not seen his doctor for this. He has had 3 stents in the past and thinks that this feels different.  The history is provided by the patient.  Chest Pain   This is a new problem. The problem occurs constantly. The problem has not changed since onset.The pain is present in the epigastric region. The pain is at a severity of 3/10. The pain is moderate. The quality of the pain is described as brief. The pain does not radiate. Duration of episode(s) is 1 month. Associated symptoms include abdominal pain. Pertinent negatives include no fever, no headaches, no palpitations, no shortness of breath and no vomiting. He has tried nothing for the symptoms. The treatment provided no relief.  His past medical history is significant for MI.    Past Medical History:  Diagnosis Date  . AAA (abdominal aortic aneurysm) ([place])    s/p repair 6/11  . CAD (coronary artery disease)    s/p CABG 2/12:  LIMA to LAD, SVG to diagonal-1, SVG to ramus intermedius,, SVG to AM (Dr. Roxan Hockey) ;  b.  Myoview 10/13:  inf infarct with very mild peri-infarct ischemia, EF 49%, inf HK; c  He has severe three-vessel native coronary artery disease. Catheterization March 2015 as above  . CAP (community acquired pneumonia) 02/23/2014  . Carotid stenosis    a.   dopplers 3/81: LICA 01-75%;  b. Carotid dopplers 3/14:  R 0-39%, L 60-79%, repeat 6 mos  . COPD (chronic obstructive pulmonary disease) ([place])   . GERD (gastroesophageal reflux disease)   . Headache(784.0)   . Hyperlipidemia   . Hypertension   . Hypoxia 02/23/2014  . Inguinal hernia   . Myocardial infarction [facility])    PCI x3  . PAD (peripheral artery disease) ([place])    ABIs 4/12:  R 0.88, L 0.92; R SFA 40%, L CFA 50%, L SFA 50-60%  . Pneumonia    hx  . Stroke [facility])    h/o pontine CVA  . Thyroid nodule    incidental finding on carotid doppler 3/14 => thyroid U/S ordered    Patient Active Problem List   Diagnosis Date Noted  . Chronic pain syndrome 07/19/2015  . Degenerative disc disease, lumbar 07/19/2015  . Weakness 04/17/2015  . Generalized weakness 04/17/2015  . Polycythemia 04/17/2015  . BPH (benign prostatic hyperplasia) 04/17/2015  . Seizure disorder ([place]) 10/12/2014  . TIA (transient ischemic attack) 08/23/2013  . Other and unspecified angina pectoris 07/04/2013  . Nontoxic multinodular goiter 10/07/2012  . Unstable angina ([place]) 06/19/2012  . Carotid stenosis   . PAD (peripheral artery disease) ([place])   . Left inguinal hernia 05/30/2012  . COPD (chronic obstructive  pulmonary disease) ([place]) 08/24/2011  . S/P AAA repair 08/24/2011  . Lumbar disc disease   . Hypertensive heart disease   . Hyperlipidemia   . CAD (coronary artery disease)   . History of pontine CVA   . GERD (gastroesophageal reflux disease)     Past Surgical History:  Procedure Laterality Date  . ABDOMINAL AORTIC ANEURYSM REPAIR  10-03-2009  . CORONARY ARTERY BYPASS GRAFT  06/24/2010   LIMA to the LAD, SVG to first diagonal, SVG to ramus intermediate, SVG to acute marginal. EF 50%. 2/12  . EXPLORATION POST OPERATIVE OPEN HEART    . EXPLORATORY LAPAROTOMY  09/2009   ligation of lumbar arteries  . EXTERNAL EAR SURGERY Left    laceration child  . HIP SURGERY  40 years ago   left hip bone removal and  pinning  . INGUINAL HERNIA REPAIR Left 07/21/2012   Procedure: HERNIA REPAIR INGUINAL ADULT;  Surgeon: Merrie Roof, MD;  Location: [facility];  Service: General;  Laterality: Left;  . INSERTION OF MESH Left 07/21/2012   Procedure: INSERTION OF MESH;  Surgeon: Merrie Roof, MD;  Location: [facility];  Service: General;  Laterality: Left;  . LEFT HEART CATHETERIZATION WITH CORONARY/GRAFT ANGIOGRAM  06/21/2012   Procedure: LEFT HEART CATHETERIZATION WITH Beatrix Fetters;  Surgeon: Minus Breeding, MD;  Location: [facility] CATH LAB;  Service: Cardiovascular;;  . LEFT HEART CATHETERIZATION WITH CORONARY/GRAFT ANGIOGRAM N/A 07/04/2013   Procedure: LEFT HEART CATHETERIZATION WITH Beatrix Fetters;  Surgeon: Sinclair Grooms, MD;  Location: [facility] CATH LAB;  Service: Cardiovascular;  Laterality: N/A;  . NM MYOCAR PERF WALL MOTION  12/02/2010   inferoapical defect is fixed c/w prior infarction/scar, there is minimal borderzone ischemia.        Home Medications    Prior to Admission medications   Medication Sig Start Date End Date Taking? Authorizing Provider  aspirin EC 81 MG tablet Take 81 mg by mouth at bedtime.    Yes [provider]  gabapentin (NEURONTIN) 300 MG capsule Take 300 mg by mouth See admin instructions. Take one capsule (300 mg) by mouth daily at bedtime, may also take one capsule (300 mg) during the day as needed for pain 07/28/17  Yes [provider]  metoprolol tartrate (LOPRESSOR) 25 MG tablet Take 25 mg by mouth 2 (two) times daily.   Yes [provider]  brimonidine (ALPHAGAN) 0.2 % ophthalmic solution See admin instructions. Use as directed by eye doctor for cataract surgery scheduled for 10/05/17 and 10/31/17 09/21/17   [provider]  clopidogrel (PLAVIX) 75 MG tablet Take 1 tablet (75 mg total) by mouth daily with breakfast. Patient not taking: Reported on 06/12/2015 09/23/13   Bernadene Bell, MD  clotrimazole (LOTRIMIN) 1 % cream Apply to  affected area 2 times daily Patient not taking: Reported on 05/20/2017 12/06/16   Orlie Dakin, MD  clotrimazole (LOTRIMIN) 1 % cream Apply to affected area 2 times daily Patient not taking: Reported on 09/26/2017 05/20/17   Lajean Saver, MD  cyclopentolate (CYCLODRYL,CYCLOGYL) 1 % ophthalmic solution See admin instructions. Use as directed by eye doctor for cataract surgery scheduled for 10/05/17 and 10/31/17 09/21/17   [provider]  furosemide (LASIX) 20 MG tablet Take 1 tablet (20 mg total) by mouth daily. Patient not taking: Reported on 05/20/2017 07/16/15   Minus Breeding, MD  gabapentin (NEURONTIN) 100 MG capsule Take 1 capsule (100 mg total) by mouth 3 (three) times daily. Patient not taking: Reported on  09/26/2017 05/24/15   Wardell Honour, MD  HYDROcodone-acetaminophen [facility]) 10-325 MG tablet Take 1 tablet by mouth 2 (two) times daily as needed for severe pain. Patient not taking: Reported on 05/20/2017 05/24/15   Wardell Honour, MD  isosorbide mononitrate (IMDUR) 120 MG 24 hr tablet Take 1 tablet (120 mg total) by mouth daily. PLEASE CONTACT OFFICE FOR ADDITIONAL REFILLS Patient not taking: Reported on 05/20/2017 09/16/15   Minus Breeding, MD  ketorolac (ACULAR) 0.5 % ophthalmic solution See admin instructions. Use as directed by eye doctor for cataract surgery scheduled for 10/05/17 and 10/31/17 09/21/17   [provider]  levETIRAcetam (KEPPRA) 500 MG tablet TAKE ONE TABLET BY MOUTH TWICE DAILY Patient not taking: Reported on 05/20/2017 09/14/15   Wardell Honour, MD  morphine (MS CONTIN) 30 MG 12 hr tablet Take 1 tablet (30 mg total) by mouth every 12 (twelve) hours. Patient not taking: Reported on 05/20/2017 05/24/15   Wardell Honour, MD  ofloxacin (OCUFLOX) 0.3 % ophthalmic solution See admin instructions. Use as directed by eye doctor for cataract surgery scheduled for 10/05/17 and 10/31/17    [provider]  prednisoLONE acetate (PRED FORTE) 1 % ophthalmic suspension  See admin instructions. Use as directed by eye doctor for cataract surgery scheduled for 10/05/17 and 10/31/17    [provider]  ranolazine (RANEXA) 1000 MG SR tablet Take 1 tablet (1,000 mg total) by mouth 2 (two) times daily. Please schedule appointment for refills. Patient not taking: Reported on 05/20/2017 07/12/15   Minus Breeding, MD  rosuvastatin (CRESTOR) 40 MG tablet Take 1 tablet (40 mg total) by mouth daily. Please schedule appointment for refills. Patient not taking: Reported on 05/20/2017 01/14/16   Minus Breeding, MD    Family History Family History  Problem Relation Age of Onset  . Alcohol abuse Father   . Cancer Sister 42       unknown primary  . Diabetes Mother   . Hypertension Mother   . Cancer Sister 79       kidney cancer    Social History Social History   Tobacco Use  . Smoking status: Former Smoker    Packs/day: 1.00    Years: 30.00    Pack years: 30.00    Types: Cigarettes    Last attempt to quit: 01/25/2010    Years since quitting: 7.6  . Smokeless tobacco: Never Used  Substance Use Topics  . Alcohol use: No    Comment: former drinker - Quit 2011.  . Drug use: No     Allergies   Patient has no known allergies.   Review of Systems Review of Systems  Constitutional: Negative for chills and fever.  HENT: Negative for congestion and facial swelling.   Eyes: Negative for discharge and visual disturbance.  Respiratory: Negative for shortness of breath.   Cardiovascular: Positive for chest pain. Negative for palpitations.  Gastrointestinal: Positive for abdominal pain. Negative for diarrhea and vomiting.  Musculoskeletal: Negative for arthralgias and myalgias.  Skin: Negative for color change and rash.  Neurological: Negative for tremors, syncope and headaches.  Psychiatric/Behavioral: Negative for confusion and dysphoric mood.     Physical Exam Updated Vital Signs BP (!) 164/95   Pulse 84   Temp 98.9 F (37.2 C)   Resp 16   Ht 5'  5" (1.651 m)   Wt 77.1 kg (170 lb)   SpO2 96%   BMI 28.29 kg/m   Physical Exam  Constitutional: He is oriented to person,  place, and time. He appears well-developed and well-nourished.  HENT:  Head: Normocephalic and atraumatic.  Eyes: Pupils are equal, round, and reactive to light. EOM are normal.  Neck: Normal range of motion. Neck supple. No JVD present.  Cardiovascular: Normal rate and regular rhythm. Exam reveals no gallop and no friction rub.  No murmur heard. Pulmonary/Chest: No respiratory distress. He has no wheezes.  Abdominal: He exhibits distension (mild, tympanitic to percussion). He exhibits no mass. There is tenderness (mild epigastric). There is no rebound and no guarding.  Musculoskeletal: Normal range of motion.  Neurological: He is alert and oriented to person, place, and time.  Skin: No rash noted. No pallor.  Psychiatric: He has a normal mood and affect. His behavior is normal.  Nursing note and vitals reviewed.    ED Treatments / Results  Labs (all labs ordered are listed, but only abnormal results are displayed) Labs Reviewed  BASIC METABOLIC PANEL - Abnormal; Notable for the following components:      Result Value   Glucose, Bld 105 (*)    All other components within normal limits  CBC - Abnormal; Notable for the following components:   Hemoglobin 17.5 (*)    HCT 54.3 (*)    All other components within normal limits  TROPONIN I  I-STAT TROPONIN, ED    EKG EKG Interpretation  Date/Time:  Sunday September 26 2017 15:27:46 EDT Ventricular Rate:  82 PR Interval:  172 QRS Duration: 92 QT Interval:  384 QTC Calculation: 448 R Axis:   -25 Text Interpretation:  Normal sinus rhythm Inferior infarct , age undetermined Abnormal ECG Since last tracing rate slower Confirmed by ,  ([id number]) on 09/26/2017 4:50:25 PM   Radiology Dg Chest 2 View  Result Date: 09/26/2017 CLINICAL DATA:  67 y/o M; intermittent left-sided chest pain for 1 month with left arm  radiation. EXAM: CHEST - 2 VIEW COMPARISON:  05/20/2017 chest radiograph FINDINGS: Normal cardiac silhouette given projection and technique. Post median sternotomy with multiple wires in alignment. Aortic atherosclerosis with calcification. Linear opacities in the lung bases, likely scarring and/or atelectasis. No focal consolidation. No pleural effusion or pneumothorax. No acute osseous abnormality is evident. IMPRESSION: Linear opacities in lung bases, likely scarring and/or atelectasis. No focal consolidation. Stable cardiac silhouette. Electronically Signed   By: Lance  Furusawa-Stratton M.D.   On: 09/26/2017 16:48    Procedures Procedures (including critical care time)  Medications Ordered in ED Medications  alum & mag hydroxide-simeth (MAALOX/MYLANTA) 200-200-20 MG/5ML suspension 15 mL (15 mLs Oral Given 09/26/17 1727)     Initial Impression / Assessment and Plan / ED Course  I have reviewed the triage vital signs and the nursing notes.  Pertinent labs & imaging results that were available during my care of the patient were reviewed by me and considered in my medical decision making (see chart for details).     67  yo M with a chief complaint of epigastric abdominal pain.  Going on for the past month.  Seems to come and go.  Not related to food.  Patient has a history of an MI in the past.  I discussed the case with Dr. Marigene Ehlers, he felt that with the patient's atypical symptoms and no significant concerning findings on EKG or history that a delta troponin if normal would be a reasonable plan and discharge him home to follow-up with his cardiologist.  Delta negative.  Feels much better after Maalox.  Feel likely gastric in nature, repeat exam non concerning.  Do not feels imaging is needed, no RUQ pain, negative murphys.  Trial zantac. D/c home.   8:14 PM:  I have discussed the diagnosis/risks/treatment options with the patient and believe the pt to be eligible for discharge home to  follow-up with PCP. We also discussed returning to the ED immediately if new or worsening sx occur. We discussed the sx which are most concerning (e.g., sudden worsening pain, fever, inability to tolerate by mouth) that necessitate immediate return. Medications administered to the patient during their visit and any new prescriptions provided to the patient are listed below.  Medications given during this visit Medications  alum & mag hydroxide-simeth (MAALOX/MYLANTA) 200-200-20 MG/5ML suspension 15 mL (15 mLs Oral Given 09/26/17 1727)      The patient appears reasonably screen and/or stabilized for discharge and I doubt any other medical condition or other [facility] requiring further screening, evaluation, or treatment in the ED at this time prior to discharge.    Final Clinical Impressions(s) / ED Diagnoses   Final diagnoses:  Atypical chest pain    ED Discharge Orders    None       Deno Etienne, DO 09/26/17 2014

## 2017-09-26 NOTE — ED Notes (Signed)
Pt son called and said to call him when he is discharged and he will come get pt.

## 2017-09-26 NOTE — ED Triage Notes (Signed)
The pt arrived by [facility] from home pt c/o chest pain for one month with lt arm radiation stent in heart in the past 10 years  He is also c/o some abd bloatiing for the same amount of time

## 2017-09-26 NOTE — ED Notes (Signed)
Pt understood dc material. NAD Noted 

## 2017-10-05 DIAGNOSIS — H25812 Combined forms of age-related cataract, left eye: Secondary | ICD-10-CM | POA: Diagnosis not present

## 2017-10-05 DIAGNOSIS — H2512 Age-related nuclear cataract, left eye: Secondary | ICD-10-CM | POA: Diagnosis not present

## 2017-10-26 ENCOUNTER — Emergency Department (HOSPITAL_COMMUNITY)
Admission: EM | Admit: 2017-10-26 | Discharge: 2017-10-26 | Disposition: A | Discharge status: Home / Self Care | Payer: Medicare Other | Attending: Emergency Medicine | Admitting: Emergency Medicine

## 2017-10-26 ENCOUNTER — Encounter (HOSPITAL_COMMUNITY): Payer: Self-pay | Admitting: Emergency Medicine

## 2017-10-26 DIAGNOSIS — Z87891 Personal history of nicotine dependence: Secondary | ICD-10-CM | POA: Insufficient documentation

## 2017-10-26 DIAGNOSIS — Z8673 Personal history of transient ischemic attack (TIA), and cerebral infarction without residual deficits: Secondary | ICD-10-CM | POA: Insufficient documentation

## 2017-10-26 DIAGNOSIS — R609 Edema, unspecified: Secondary | ICD-10-CM | POA: Diagnosis not present

## 2017-10-26 DIAGNOSIS — I2510 Atherosclerotic heart disease of native coronary artery without angina pectoris: Secondary | ICD-10-CM | POA: Insufficient documentation

## 2017-10-26 DIAGNOSIS — Z7982 Long term (current) use of aspirin: Secondary | ICD-10-CM | POA: Diagnosis not present

## 2017-10-26 DIAGNOSIS — B3789 Other sites of candidiasis: Secondary | ICD-10-CM

## 2017-10-26 DIAGNOSIS — B372 Candidiasis of skin and nail: Secondary | ICD-10-CM | POA: Diagnosis not present

## 2017-10-26 DIAGNOSIS — Z7902 Long term (current) use of antithrombotics/antiplatelets: Secondary | ICD-10-CM | POA: Diagnosis not present

## 2017-10-26 DIAGNOSIS — I10 Essential (primary) hypertension: Secondary | ICD-10-CM | POA: Diagnosis not present

## 2017-10-26 DIAGNOSIS — Z79899 Other long term (current) drug therapy: Secondary | ICD-10-CM | POA: Insufficient documentation

## 2017-10-26 DIAGNOSIS — R1909 Other intra-abdominal and pelvic swelling, mass and lump: Secondary | ICD-10-CM | POA: Diagnosis not present

## 2017-10-26 DIAGNOSIS — R1030 Lower abdominal pain, unspecified: Secondary | ICD-10-CM | POA: Diagnosis not present

## 2017-10-26 DIAGNOSIS — J449 Chronic obstructive pulmonary disease, unspecified: Secondary | ICD-10-CM | POA: Diagnosis not present

## 2017-10-26 DIAGNOSIS — Z951 Presence of aortocoronary bypass graft: Secondary | ICD-10-CM | POA: Diagnosis not present

## 2017-10-26 MED ORDER — MICONAZOLE NITRATE 2 % EX CREA: 1.0000 "application " | TOPICAL_CREAM | Freq: Two times a day (BID) | CUTANEOUS | 0 refills | Status: DC

## 2017-10-26 MED ORDER — FLUCONAZOLE 150 MG PO TABS
150.0000 mg | ORAL_TABLET | Freq: Once | ORAL | Status: AC
  Administered 2017-10-26: 150 mg via ORAL
  Filled 2017-10-26 (×2): qty 1

## 2017-10-26 NOTE — ED Triage Notes (Signed)
Pt arrives via EMS with complaints of groin pain and swelling. Reports a hx of this where they needed to drain. Pt able to urinate some. Pt also reports some intermittent chest pain. EMS gave 100 mcg fentanyl, pain now a 0/10.

## 2017-10-26 NOTE — ED Provider Notes (Signed)
[facility] EMERGENCY DEPARTMENT Provider Note   CSN: [id number] Arrival date & time: 10/26/17  1521     History   Chief Complaint Chief Complaint  Patient presents with  . Groin Swelling    HPI Chris Payne is a 67 y.o. male.  HPI 67 yo male presents today complaining of groin pain and swelling.  Patient states pain has been there for 1-2 months.  He points to the perineal area and staes there is swelling.  Deneis fever, chills, painful urination, problems with bm, or other symptoms  Past Medical History:  Diagnosis Date  . AAA (abdominal aortic aneurysm) ([place])    s/p repair 6/11  . CAD (coronary artery disease)    s/p CABG 2/12:  LIMA to LAD, SVG to diagonal-1, SVG to ramus intermedius,, SVG to AM (Dr. Roxan Hockey) ;  b.  Myoview 10/13:  inf infarct with very mild peri-infarct ischemia, EF 49%, inf HK; c  He has severe three-vessel native coronary artery disease. Catheterization March 2015 as above  . CAP (community acquired pneumonia) 02/23/2014  . Carotid stenosis    a.  dopplers 7/25: LICA 36-64%;  b. Carotid dopplers 3/14:  R 0-39%, L 60-79%, repeat 6 mos  . COPD (chronic obstructive pulmonary disease) ([place])   . GERD (gastroesophageal reflux disease)   . Headache(784.0)   . Hyperlipidemia   . Hypertension   . Hypoxia 02/23/2014  . Inguinal hernia   . Myocardial infarction [facility])    PCI x3  . PAD (peripheral artery disease) ([place])    ABIs 4/12:  R 0.88, L 0.92; R SFA 40%, L CFA 50%, L SFA 50-60%  . Pneumonia    hx  . Stroke [facility])    h/o pontine CVA  . Thyroid nodule    incidental finding on carotid doppler 3/14 => thyroid U/S ordered    Patient Active Problem List   Diagnosis Date Noted  . Chronic pain syndrome 07/19/2015  . Degenerative disc disease, lumbar 07/19/2015  . Weakness 04/17/2015  . Generalized weakness 04/17/2015  . Polycythemia 04/17/2015  . BPH (benign prostatic hyperplasia) 04/17/2015  . Seizure disorder ([place]) 10/12/2014   . TIA (transient ischemic attack) 08/23/2013  . Other and unspecified angina pectoris 07/04/2013  . Nontoxic multinodular goiter 10/07/2012  . Unstable angina ([place]) 06/19/2012  . Carotid stenosis   . PAD (peripheral artery disease) ([place])   . Left inguinal hernia 05/30/2012  . COPD (chronic obstructive pulmonary disease) ([place]) 08/24/2011  . S/P AAA repair 08/24/2011  . Lumbar disc disease   . Hypertensive heart disease   . Hyperlipidemia   . CAD (coronary artery disease)   . History of pontine CVA   . GERD (gastroesophageal reflux disease)     Past Surgical History:  Procedure Laterality Date  . ABDOMINAL AORTIC ANEURYSM REPAIR  10-03-2009  . CORONARY ARTERY BYPASS GRAFT  06/24/2010   LIMA to the LAD, SVG to first diagonal, SVG to ramus intermediate, SVG to acute marginal. EF 50%. 2/12  . EXPLORATION POST OPERATIVE OPEN HEART    . EXPLORATORY LAPAROTOMY  09/2009   ligation of lumbar arteries  . EXTERNAL EAR SURGERY Left    laceration child  . HIP SURGERY  40 years ago   left hip bone removal and pinning  . INGUINAL HERNIA REPAIR Left 07/21/2012   Procedure: HERNIA REPAIR INGUINAL ADULT;  Surgeon: Merrie Roof, MD;  Location: [facility];  Service: General;  Laterality: Left;  . INSERTION OF MESH Left  07/21/2012   Procedure: INSERTION OF MESH;  Surgeon: Merrie Roof, MD;  Location: [place];  Service: General;  Laterality: Left;  . LEFT HEART CATHETERIZATION WITH CORONARY/GRAFT ANGIOGRAM  06/21/2012   Procedure: LEFT HEART CATHETERIZATION WITH Beatrix Fetters;  Surgeon: Minus Breeding, MD;  Location: [facility] CATH LAB;  Service: Cardiovascular;;  . LEFT HEART CATHETERIZATION WITH CORONARY/GRAFT ANGIOGRAM N/A 07/04/2013   Procedure: LEFT HEART CATHETERIZATION WITH Beatrix Fetters;  Surgeon: Sinclair Grooms, MD;  Location: [facility] CATH LAB;  Service: Cardiovascular;  Laterality: N/A;  . NM MYOCAR PERF WALL MOTION  12/02/2010   inferoapical defect is fixed c/w prior infarction/scar,  there is minimal borderzone ischemia.        Home Medications    Prior to Admission medications   Medication Sig Start Date End Date Taking? Authorizing Provider  aspirin EC 81 MG tablet Take 81 mg by mouth at bedtime.     [provider]  brimonidine (ALPHAGAN) 0.2 % ophthalmic solution See admin instructions. Use as directed by eye doctor for cataract surgery scheduled for 10/05/17 and 10/31/17 09/21/17   [provider]  clopidogrel (PLAVIX) 75 MG tablet Take 1 tablet (75 mg total) by mouth daily with breakfast. Patient not taking: Reported on 06/12/2015 09/23/13   Bernadene Bell, MD  clotrimazole (LOTRIMIN) 1 % cream Apply to affected area 2 times daily Patient not taking: Reported on 05/20/2017 12/06/16   Orlie Dakin, MD  clotrimazole (LOTRIMIN) 1 % cream Apply to affected area 2 times daily Patient not taking: Reported on 09/26/2017 05/20/17   Lajean Saver, MD  cyclopentolate (CYCLODRYL,CYCLOGYL) 1 % ophthalmic solution See admin instructions. Use as directed by eye doctor for cataract surgery scheduled for 10/05/17 and 10/31/17 09/21/17   [provider]  furosemide (LASIX) 20 MG tablet Take 1 tablet (20 mg total) by mouth daily. Patient not taking: Reported on 05/20/2017 07/16/15   Minus Breeding, MD  gabapentin (NEURONTIN) 100 MG capsule Take 1 capsule (100 mg total) by mouth 3 (three) times daily. Patient not taking: Reported on 09/26/2017 05/24/15   Wardell Honour, MD  gabapentin (NEURONTIN) 300 MG capsule Take 300 mg by mouth See admin instructions. Take one capsule (300 mg) by mouth daily at bedtime, may also take one capsule (300 mg) during the day as needed for pain 07/28/17   [provider]  HYDROcodone-acetaminophen (NORCO) 10-325 MG tablet Take 1 tablet by mouth 2 (two) times daily as needed for severe pain. Patient not taking: Reported on 05/20/2017 05/24/15   Wardell Honour, MD  isosorbide mononitrate (IMDUR) 120 MG 24 hr tablet Take 1 tablet (120  mg total) by mouth daily. PLEASE CONTACT OFFICE FOR ADDITIONAL REFILLS Patient not taking: Reported on 05/20/2017 09/16/15   Minus Breeding, MD  ketorolac (ACULAR) 0.5 % ophthalmic solution See admin instructions. Use as directed by eye doctor for cataract surgery scheduled for 10/05/17 and 10/31/17 09/21/17   [provider]  levETIRAcetam (KEPPRA) 500 MG tablet TAKE ONE TABLET BY MOUTH TWICE DAILY Patient not taking: Reported on 05/20/2017 09/14/15   Wardell Honour, MD  metoprolol tartrate (LOPRESSOR) 25 MG tablet Take 25 mg by mouth 2 (two) times daily.    [provider]  morphine (MS CONTIN) 30 MG 12 hr tablet Take 1 tablet (30 mg total) by mouth every 12 (twelve) hours. Patient not taking: Reported on 05/20/2017 05/24/15   Wardell Honour, MD  ofloxacin (OCUFLOX) 0.3 % ophthalmic solution See admin instructions. Use as  directed by eye doctor for cataract surgery scheduled for 10/05/17 and 10/31/17    [provider]  prednisoLONE acetate (PRED FORTE) 1 % ophthalmic suspension See admin instructions. Use as directed by eye doctor for cataract surgery scheduled for 10/05/17 and 10/31/17    [provider]  ranolazine (RANEXA) 1000 MG SR tablet Take 1 tablet (1,000 mg total) by mouth 2 (two) times daily. Please schedule appointment for refills. Patient not taking: Reported on 05/20/2017 07/12/15   Minus Breeding, MD  rosuvastatin (CRESTOR) 40 MG tablet Take 1 tablet (40 mg total) by mouth daily. Please schedule appointment for refills. Patient not taking: Reported on 05/20/2017 01/14/16   Minus Breeding, MD    Family History Family History  Problem Relation Age of Onset  . Alcohol abuse Father   . Cancer Sister 25       unknown primary  . Diabetes Mother   . Hypertension Mother   . Cancer Sister 15       kidney cancer    Social History Social History   Tobacco Use  . Smoking status: Former Smoker    Packs/day: 1.00    Years: 30.00    Pack years: 30.00     Types: Cigarettes    Last attempt to quit: 01/25/2010    Years since quitting: 7.7  . Smokeless tobacco: Never Used  Substance Use Topics  . Alcohol use: No    Comment: former drinker - Quit 2011.  . Drug use: No     Allergies   Patient has no known allergies.   Review of Systems Review of Systems  All other systems reviewed and are negative.    Physical Exam Updated Vital Signs Ht 1.651 m (5\' 5" )   Wt 77.1 kg (170 lb)   SpO2 96%   BMI 28.29 kg/m  Vitals:   10/26/17 1522  SpO2: 96%    Physical Exam  Constitutional: He is oriented to person, place, and time. He appears well-developed and well-nourished.  HENT:  Head: Normocephalic and atraumatic.  Right Ear: External ear normal.  Left Ear: External ear normal.  Nose: Nose normal.  Mouth/Throat: Oropharynx is clear and moist.  Eyes: Pupils are equal, round, and reactive to light. EOM are normal.  Neck: Normal range of motion.  Cardiovascular: Normal rate, regular rhythm and normal heart sounds.  Pulmonary/Chest: Effort normal and breath sounds normal.  Abdominal: Soft. Bowel sounds are normal.  Genitourinary: Penis normal.  Genitourinary Comments: eythema in inguinal area and around testicles,no swelling noted- no hernia appreciated, testicles normal and nttp  Musculoskeletal: Normal range of motion.  Neurological: He is alert and oriented to person, place, and time.  Skin: Skin is warm. Capillary refill takes less than 2 seconds.  Psychiatric: He has a normal mood and affect.  Nursing note and vitals reviewed.    ED Treatments / Results  Labs (all labs ordered are listed, but only abnormal results are displayed) Labs Reviewed - No data to display  EKG None  Radiology No results found.  Procedures Procedures (including critical care time)  Medications Ordered in ED Medications - No data to display   Initial Impression / Assessment and Plan / ED Course  I have reviewed the triage vital signs and  the nursing notes.  Pertinent labs & imaging results that were available during my care of the patient were reviewed by me and considered in my medical decision making (see chart for details).     Beefy red induration of perineal  area c.w. Candida infection Plan monistat 2% cream for seven days Keep area clean and dry Hypertension- recheck with pmd this week  Final Clinical Impressions(s) / ED Diagnoses   Final diagnoses:  None    ED Discharge Orders    None       Pattricia Boss, MD 10/27/17 1451

## 2017-10-26 NOTE — ED Notes (Signed)
Pt stable, ambulatory, and verbalizes understanding of d/c instructions.  

## 2017-10-26 NOTE — Discharge Instructions (Addendum)
Keep skin clean, dry, and open to air as much as possible. Apply cream daily Recheck with your doctor for recheck of rash and bp this week.

## 2017-11-03 DIAGNOSIS — H2511 Age-related nuclear cataract, right eye: Secondary | ICD-10-CM | POA: Diagnosis not present

## 2017-11-03 DIAGNOSIS — H2589 Other age-related cataract: Secondary | ICD-10-CM | POA: Diagnosis not present

## 2017-11-03 DIAGNOSIS — H25011 Cortical age-related cataract, right eye: Secondary | ICD-10-CM | POA: Diagnosis not present

## 2017-11-30 DIAGNOSIS — H2589 Other age-related cataract: Secondary | ICD-10-CM | POA: Diagnosis not present

## 2017-11-30 DIAGNOSIS — H2521 Age-related cataract, morgagnian type, right eye: Secondary | ICD-10-CM | POA: Diagnosis not present

## 2017-11-30 DIAGNOSIS — H25011 Cortical age-related cataract, right eye: Secondary | ICD-10-CM | POA: Diagnosis not present

## 2017-11-30 DIAGNOSIS — H2511 Age-related nuclear cataract, right eye: Secondary | ICD-10-CM | POA: Diagnosis not present

## 2017-12-04 ENCOUNTER — Emergency Department (HOSPITAL_COMMUNITY): Payer: Medicare Other

## 2017-12-04 ENCOUNTER — Emergency Department (HOSPITAL_BASED_OUTPATIENT_CLINIC_OR_DEPARTMENT_OTHER): Payer: Medicare Other

## 2017-12-04 ENCOUNTER — Other Ambulatory Visit: Payer: Self-pay

## 2017-12-04 ENCOUNTER — Encounter (HOSPITAL_COMMUNITY): Payer: Self-pay | Admitting: Emergency Medicine

## 2017-12-04 ENCOUNTER — Emergency Department (HOSPITAL_COMMUNITY)
Admission: EM | Admit: 2017-12-04 | Discharge: 2017-12-04 | Disposition: A | Discharge status: Home / Self Care | Payer: Medicare Other | Attending: Emergency Medicine | Admitting: Emergency Medicine

## 2017-12-04 DIAGNOSIS — I252 Old myocardial infarction: Secondary | ICD-10-CM | POA: Diagnosis not present

## 2017-12-04 DIAGNOSIS — I10 Essential (primary) hypertension: Secondary | ICD-10-CM | POA: Insufficient documentation

## 2017-12-04 DIAGNOSIS — Z8673 Personal history of transient ischemic attack (TIA), and cerebral infarction without residual deficits: Secondary | ICD-10-CM | POA: Diagnosis not present

## 2017-12-04 DIAGNOSIS — Z79899 Other long term (current) drug therapy: Secondary | ICD-10-CM | POA: Insufficient documentation

## 2017-12-04 DIAGNOSIS — Z87891 Personal history of nicotine dependence: Secondary | ICD-10-CM | POA: Insufficient documentation

## 2017-12-04 DIAGNOSIS — J449 Chronic obstructive pulmonary disease, unspecified: Secondary | ICD-10-CM | POA: Insufficient documentation

## 2017-12-04 DIAGNOSIS — R197 Diarrhea, unspecified: Secondary | ICD-10-CM | POA: Diagnosis not present

## 2017-12-04 DIAGNOSIS — R1032 Left lower quadrant pain: Secondary | ICD-10-CM | POA: Diagnosis not present

## 2017-12-04 DIAGNOSIS — R600 Localized edema: Secondary | ICD-10-CM | POA: Insufficient documentation

## 2017-12-04 DIAGNOSIS — Z951 Presence of aortocoronary bypass graft: Secondary | ICD-10-CM | POA: Insufficient documentation

## 2017-12-04 DIAGNOSIS — R609 Edema, unspecified: Secondary | ICD-10-CM

## 2017-12-04 DIAGNOSIS — I719 Aortic aneurysm of unspecified site, without rupture: Secondary | ICD-10-CM | POA: Diagnosis not present

## 2017-12-04 DIAGNOSIS — I2510 Atherosclerotic heart disease of native coronary artery without angina pectoris: Secondary | ICD-10-CM | POA: Diagnosis not present

## 2017-12-04 DIAGNOSIS — K5900 Constipation, unspecified: Secondary | ICD-10-CM | POA: Diagnosis not present

## 2017-12-04 DIAGNOSIS — Z7982 Long term (current) use of aspirin: Secondary | ICD-10-CM | POA: Insufficient documentation

## 2017-12-04 DIAGNOSIS — K8020 Calculus of gallbladder without cholecystitis without obstruction: Secondary | ICD-10-CM | POA: Diagnosis not present

## 2017-12-04 LAB — URINALYSIS, ROUTINE W REFLEX MICROSCOPIC
Bilirubin Urine: NEGATIVE
GLUCOSE, UA: NEGATIVE mg/dL
HGB URINE DIPSTICK: NEGATIVE
Ketones, ur: NEGATIVE mg/dL
Leukocytes, UA: NEGATIVE
Nitrite: NEGATIVE
PH: 7 (ref 5.0–8.0)
Protein, ur: NEGATIVE mg/dL
SPECIFIC GRAVITY, URINE: 1.008 (ref 1.005–1.030)

## 2017-12-04 LAB — CBC WITH DIFFERENTIAL/PLATELET
Abs Immature Granulocytes: 0 10*3/uL (ref 0.0–0.1)
Basophils Absolute: 0 10*3/uL (ref 0.0–0.1)
Basophils Relative: 1 %
EOS PCT: 2 %
Eosinophils Absolute: 0.1 10*3/uL (ref 0.0–0.7)
HEMATOCRIT: 52.1 % — AB (ref 39.0–52.0)
HEMOGLOBIN: 16.5 g/dL (ref 13.0–17.0)
Immature Granulocytes: 0 %
LYMPHS ABS: 1.6 10*3/uL (ref 0.7–4.0)
LYMPHS PCT: 32 %
MCH: 32.5 pg (ref 26.0–34.0)
MCHC: 31.7 g/dL (ref 30.0–36.0)
MCV: 102.6 fL — AB (ref 78.0–100.0)
MONO ABS: 0.4 10*3/uL (ref 0.1–1.0)
Monocytes Relative: 9 %
Neutro Abs: 2.7 10*3/uL (ref 1.7–7.7)
Neutrophils Relative %: 56 %
Platelets: 170 10*3/uL (ref 150–400)
RBC: 5.08 MIL/uL (ref 4.22–5.81)
RDW: 13 % (ref 11.5–15.5)
WBC: 4.9 10*3/uL (ref 4.0–10.5)

## 2017-12-04 LAB — COMPREHENSIVE METABOLIC PANEL
ALBUMIN: 3.5 g/dL (ref 3.5–5.0)
ALK PHOS: 80 U/L (ref 38–126)
ALT: 13 U/L (ref 0–44)
AST: 15 U/L (ref 15–41)
Anion gap: 8 (ref 5–15)
BILIRUBIN TOTAL: 0.7 mg/dL (ref 0.3–1.2)
BUN: 5 mg/dL — ABNORMAL LOW (ref 8–23)
CALCIUM: 9 mg/dL (ref 8.9–10.3)
CO2: 29 mmol/L (ref 22–32)
Chloride: 106 mmol/L (ref 98–111)
Creatinine, Ser: 1.09 mg/dL (ref 0.61–1.24)
GFR calc non Af Amer: >60 mL/min (ref >60)
GLUCOSE: 101 mg/dL — AB (ref 70–99)
Potassium: 4.2 mmol/L (ref 3.5–5.1)
Sodium: 143 mmol/L (ref 135–145)
TOTAL PROTEIN: 6.8 g/dL (ref 6.5–8.1)

## 2017-12-04 MED ORDER — FUROSEMIDE 20 MG PO TABS: 20.0000 mg | ORAL_TABLET | Freq: Every day | ORAL | 0 refills | Status: DC

## 2017-12-04 MED ORDER — IOHEXOL 300 MG/ML  SOLN
100.0000 mL | Freq: Once | INTRAMUSCULAR | Status: AC | PRN
  Administered 2017-12-04: 100 mL via INTRAVENOUS

## 2017-12-04 NOTE — Progress Notes (Signed)
VASCULAR LAB PRELIMINARY  PRELIMINARY  PRELIMINARY  PRELIMINARY  Left lower extremity venous duplex completed.    Preliminary report:  There is no DVT or SVT noted in the left lower extremity.  Called results to Dr. Harlin Rain, [facility], RVT 12/04/2017, 11:04 AM

## 2017-12-04 NOTE — Discharge Instructions (Signed)
Take your medications.  Follow-up with your primary care doctor and he can help arrange vascular follow-up for your dilated abdominal aorta.  Also follow-up with your cardiologist

## 2017-12-04 NOTE — ED Notes (Signed)
D/c reviewed with patient 

## 2017-12-04 NOTE — ED Provider Notes (Signed)
[facility] EMERGENCY DEPARTMENT Provider Note   CSN: [id number] Arrival date & time: 12/04/17  0847     History   Chief Complaint Chief Complaint  Patient presents with  . Abdominal Pain    Chris Payne is a 67 y.o. male.  Chris Patient presents with abdominal pain for the last week.  Dull in left lower abdomen.  Worse with certain movements.  Also has had dysuria.  Has been going for a while to.  Some burning.  Also has some constipation.  No fevers.  Also states his feet are swollen.  States it is worse on left side.  This also is been going for a while.  No fevers or chills.  No nausea or vomiting. Past Medical History:  Diagnosis Date  . AAA (abdominal aortic aneurysm) ([place])    s/p repair 6/11  . CAD (coronary artery disease)    s/p CABG 2/12:  LIMA to LAD, SVG to diagonal-1, SVG to ramus intermedius,, SVG to AM (Dr. Roxan Hockey) ;  b.  Myoview 10/13:  inf infarct with very mild peri-infarct ischemia, EF 49%, inf HK; c  He has severe three-vessel native coronary artery disease. Catheterization March 2015 as above  . CAP (community acquired pneumonia) 02/23/2014  . Carotid stenosis    a.  dopplers 3/66: LICA 29-47%;  b. Carotid dopplers 3/14:  R 0-39%, L 60-79%, repeat 6 mos  . COPD (chronic obstructive pulmonary disease) ([place])   . GERD (gastroesophageal reflux disease)   . Headache(784.0)   . Hyperlipidemia   . Hypertension   . Hypoxia 02/23/2014  . Inguinal hernia   . Myocardial infarction [facility])    PCI x3  . PAD (peripheral artery disease) ([place])    ABIs 4/12:  R 0.88, L 0.92; R SFA 40%, L CFA 50%, L SFA 50-60%  . Pneumonia    hx  . Stroke [facility])    h/o pontine CVA  . Thyroid nodule    incidental finding on carotid doppler 3/14 => thyroid U/S ordered    Patient Active Problem List   Diagnosis Date Noted  . Chronic pain syndrome 07/19/2015  . Degenerative disc disease, lumbar 07/19/2015  . Weakness 04/17/2015  . Generalized weakness  04/17/2015  . Polycythemia 04/17/2015  . BPH (benign prostatic hyperplasia) 04/17/2015  . Seizure disorder ([place]) 10/12/2014  . TIA (transient ischemic attack) 08/23/2013  . Other and unspecified angina pectoris 07/04/2013  . Nontoxic multinodular goiter 10/07/2012  . Unstable angina ([place]) 06/19/2012  . Carotid stenosis   . PAD (peripheral artery disease) ([place])   . Left inguinal hernia 05/30/2012  . COPD (chronic obstructive pulmonary disease) ([place]) 08/24/2011  . S/P AAA repair 08/24/2011  . Lumbar disc disease   . Hypertensive heart disease   . Hyperlipidemia   . CAD (coronary artery disease)   . History of pontine CVA   . GERD (gastroesophageal reflux disease)     Past Surgical History:  Procedure Laterality Date  . ABDOMINAL AORTIC ANEURYSM REPAIR  10-03-2009  . CORONARY ARTERY BYPASS GRAFT  06/24/2010   LIMA to the LAD, SVG to first diagonal, SVG to ramus intermediate, SVG to acute marginal. EF 50%. 2/12  . EXPLORATION POST OPERATIVE OPEN HEART    . EXPLORATORY LAPAROTOMY  09/2009   ligation of lumbar arteries  . EXTERNAL EAR SURGERY Left    laceration child  . HIP SURGERY  40 years ago   left hip bone removal and pinning  . INGUINAL HERNIA REPAIR  Left 07/21/2012   Procedure: HERNIA REPAIR INGUINAL ADULT;  Surgeon: Merrie Roof, MD;  Location: [facility];  Service: General;  Laterality: Left;  . INSERTION OF MESH Left 07/21/2012   Procedure: INSERTION OF MESH;  Surgeon: Merrie Roof, MD;  Location: [facility];  Service: General;  Laterality: Left;  . LEFT HEART CATHETERIZATION WITH CORONARY/GRAFT ANGIOGRAM  06/21/2012   Procedure: LEFT HEART CATHETERIZATION WITH Beatrix Fetters;  Surgeon: Minus Breeding, MD;  Location[facility] C[facility] LAB;  Service: Cardiovascular;;  . LEFT HEART CATHETERIZATION WITH CORONARY/GRAFT ANGIOGRAM N/A 07/04/2013   Procedure: LEFT HEART CATHETERIZATION WITH Beatrix Fetters;  Surgeon: Sinclair Grooms, MD;  Location: [facility] CATH LAB;  Service:  Cardiovascular;  Laterality: N/A;  . NM MYOCAR PERF WALL MOTION  12/02/2010   inferoapical defect is fixed c/w prior infarction/scar, there is minimal borderzone ischemia.        Home Medications    Prior to Admission medications   Medication Sig Start Date End Date Taking? Authorizing Provider  aspirin EC 81 MG tablet Take 81 mg by mouth daily.    Yes [provider]  brimonidine (ALPHAGAN) 0.2 % ophthalmic solution Place 1 drop into the right eye See admin instructions. Instill 1 drops to right eye once. Start on 12/01/2017 - 12/08/2017 09/21/17  Yes [provider]  ibuprofen (ADVIL,MOTRIN) 100 MG tablet Take 200 mg by mouth every 6 (six) hours as needed for pain or fever.   Yes [provider]  ketorolac (ACULAR) 0.5 % ophthalmic solution Place 1 drop into the right eye See admin instructions. Instill 1 drops to right eye once. Start on 12/01/2017 - 12/08/2017 09/21/17  Yes [provider]  ofloxacin (OCUFLOX) 0.3 % ophthalmic solution Place 2 drops into the right eye See admin instructions. Instill 2 drops to right eye once. Start on 12/01/2017 - 12/08/2017   Yes [provider]  prednisoLONE acetate (PRED FORTE) 1 % ophthalmic suspension Place 2 drops into the right eye See admin instructions. Instill 2 drops to right eye once. Start on 12/01/2017 - 12/08/2017   Yes [provider]  clopidogrel (PLAVIX) 75 MG tablet Take 1 tablet (75 mg total) by mouth daily with breakfast. Patient not taking: Reported on 06/12/2015 09/23/13   Bernadene Bell, MD  furosemide (LASIX) 20 MG tablet Take 1 tablet (20 mg total) by mouth daily. 12/04/17   Davonna Belling, MD  gabapentin (NEURONTIN) 100 MG capsule Take 1 capsule (100 mg total) by mouth 3 (three) times daily. Patient not taking: Reported on 09/26/2017 05/24/15   Wardell Honour, MD  HYDROcodone-acetaminophen [facility]) 10-325 MG tablet Take 1 tablet by mouth 2 (two) times daily as needed for severe  pain. Patient not taking: Reported on 05/20/2017 05/24/15   Wardell Honour, MD  isosorbide mononitrate (IMDUR) 120 MG 24 hr tablet Take 1 tablet (120 mg total) by mouth daily. PLEASE CONTACT OFFICE FOR ADDITIONAL REFILLS Patient not taking: Reported on 05/20/2017 09/16/15   Minus Breeding, MD  levETIRAcetam (KEPPRA) 500 MG tablet TAKE ONE TABLET BY MOUTH TWICE DAILY Patient not taking: Reported on 05/20/2017 09/14/15   Wardell Honour, MD  morphine (MS CONTIN) 30 MG 12 hr tablet Take 1 tablet (30 mg total) by mouth every 12 (twelve) hours. Patient not taking: Reported on 05/20/2017 05/24/15   Wardell Honour, MD  ranolazine (RANEXA) 1000 MG SR tablet Take 1 tablet (1,000 mg total) by mouth 2 (two) times daily. Please schedule appointment for refills.  Patient not taking: Reported on 05/20/2017 07/12/15   Minus Breeding, MD  rosuvastatin (CRESTOR) 40 MG tablet Take 1 tablet (40 mg total) by mouth daily. Please schedule appointment for refills. Patient not taking: Reported on 05/20/2017 01/14/16   Minus Breeding, MD    Family History Family History  Problem Relation Age of Onset  . Alcohol abuse Father   . Cancer Sister 69       unknown primary  . Diabetes Mother   . Hypertension Mother   . Cancer Sister 49       kidney cancer    Social History Social History   Tobacco Use  . Smoking status: Former Smoker    Packs/day: 1.00    Years: 30.00    Pack years: 30.00    Types: Cigarettes    Last attempt to quit: 01/25/2010    Years since quitting: 7.8  . Smokeless tobacco: Never Used  Substance Use Topics  . Alcohol use: No    Comment: former drinker - Quit 2011.  . Drug use: No     Allergies   Patient has no known allergies.   Review of Systems Review of Systems  Constitutional: Negative for appetite change.  HENT: Negative for congestion.   Respiratory: Negative for shortness of breath.   Cardiovascular: Positive for leg swelling. Negative for chest pain.  Genitourinary:  Positive for dysuria.  Skin: Positive for wound.  Neurological: Negative for weakness.  Hematological: Negative for adenopathy.  Psychiatric/Behavioral: Negative for confusion.     Physical Exam Updated Vital Signs BP (!) 174/84   Pulse (!) 51   Temp 98 F (36.7 C) (Oral)   Resp 17   Ht 5\' 7"  (1.702 m)   Wt 77.1 kg   BMI 26.63 kg/m   Physical Exam  Constitutional: He appears well-developed.  HENT:  Head: Normocephalic.  Eyes: Pupils are equal, round, and reactive to light.  Cardiovascular: Regular rhythm.  Pulmonary/Chest: Breath sounds normal.  Abdominal: Normal appearance.  Left lower quadrant tenderness without rebound or guarding.  Reducible umbilical hernia.  Genitourinary: Right testis shows no tenderness. Left testis shows no tenderness.  Neurological: He is alert.  Skin: Skin is warm. Capillary refill takes less than 2 seconds.  Bilateral lower extremity pitting edema, worse in the left side.  No erythema.   ED Treatments / Results  Labs (all labs ordered are listed, but only abnormal results are displayed) Labs Reviewed  CBC WITH DIFFERENTIAL/PLATELET - Abnormal; Notable for the following components:      Result Value   HCT 52.1 (*)    MCV 102.6 (*)    All other components within normal limits  COMPREHENSIVE METABOLIC PANEL - Abnormal; Notable for the following components:   Glucose, Bld 101 (*)    BUN 5 (*)    All other components within normal limits  URINALYSIS, ROUTINE W REFLEX MICROSCOPIC    EKG EKG Interpretation  Date/Time:  Saturday December 04 2017 09:00:55 EDT Ventricular Rate:  66 PR Interval:    QRS Duration: 106 QT Interval:  436 QTC Calculation: 457 R Axis:   -30 Text Interpretation:  Sinus rhythm Inferior infarct, old Confirmed by Davonna Belling [id number]) on 12/04/2017 11:17:56 AM   Radiology Ct Abdomen Pelvis W Contrast  Result Date: 12/04/2017 CLINICAL DATA:  Lower abdominal pain for the past 5 days. Burning and itching when  urinating. Difficult bowel movement yesterday. EXAM: CT ABDOMEN AND PELVIS WITH CONTRAST TECHNIQUE: Multidetector CT imaging of the abdomen and pelvis was performed  using the standard protocol following bolus administration of intravenous contrast. CONTRAST:  155mL OMNIPAQUE IOHEXOL 300 MG/ML  SOLN COMPARISON:  Lumbar spine MRI dated 08/16/2015, pelvis CT dated 04/05/2012 and chest and abdomen CTA dated 06/21/2010. FINDINGS: Lower chest: Bullous changes and scarring at both lung bases. Enlarged heart. Atheromatous coronary artery calcifications. Median sternotomy wires. Hepatobiliary: Multiple liver cysts. Mild diffuse low density of the liver. 2.6 cm gallstone in the gallbladder without gallbladder wall thickening or pericholecystic fluid. Pancreas: Diffuse pancreatic atrophy with partial fatty replacement. Spleen: Normal in size without focal abnormality. Adrenals/Urinary Tract: Stable small right adrenal mass with low density components, measuring 1.3 x 1.2 cm on image number 20 series 3. Normal appearing left adrenal gland. Tiny right renal cysts. Cortical scarring in the lower pole of the left kidney. Unremarkable ureters and urinary bladder. Stomach/Bowel: Normal appearing colon with mildly prominent stool throughout the colon. Normal appearing appendix, stomach and small bowel. Vascular/Lymphatic: Atheromatous arterial calcifications. Aortobifemoral bypass graft. This is normally opacified. At the proximal extent of the graft, there is a thrombosed infrarenal abdominal aortic aneurysm measuring 4.3 cm in maximum diameter. The graft lumen at that location is patent. 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 Again demonstrated is chronic occlusion of the common hepatic artery. No enlarged lymph nodes. Reproductive: Moderately enlarged prostate gland. Other: Surgical scar to the right of midline. Small midline ventral  hernia containing fat. Musculoskeletal: Lumbar and lower thoracic spine degenerative changes. Fixation screws bridging the left hip joint with solid bone fusion of the joint. The screws protrude beyond the medial walls of the bony pelvis on the left. IMPRESSION: 1. No acute abnormality. 2. Mild diffuse hepatic steatosis. 3. Cholelithiasis. 4. Stable small right adrenal adenoma. 5. Small midline ventral hernia containing fat. 6. Atheromatous arterial calcifications, including the coronary arteries. 7. Interval 4.3 cm infrarenal abdominal aortic aneurysm at the location of the proximal portion of an aortobifemoral bypass graft. Recommend followup by ultrasound in 1 year. This recommendation follows ACR consensus guidelines: White Paper of the ACR Incidental Findings Committee II on Vascular Findings. J Am Coll Radiol 2013; 10:789-794. 8. 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 9. Small midline ventral hernia containing fat. Electronically Signed   By: Claudie Revering M.D.   On: 12/04/2017 12:10    Procedures Procedures (including critical care time)  Medications Ordered in ED Medications  iohexol (OMNIPAQUE) 300 MG/ML solution 100 mL (100 mLs Intravenous Contrast Given 12/04/17 1137)     Initial Impression / Assessment and Plan / ED Course  I have reviewed the triage vital signs and the nursing notes.  Pertinent labs & imaging results that were available during my care of the patient were reviewed by me and considered in my medical decision making (see chart for details).     Patient with several different complaints.  Urine reassuring.  CT scan done and showed only shows an aortic aneurysm that will need to be followed.  We will follow-up with PCP who can help arrange for vascular follow-up.  States he cannot remember who or where the vascular bypass was done.  No clot on the Doppler.  Will discharge home follow-up with PCP.  Final Clinical Impressions(s) / ED  Diagnoses   Final diagnoses:  Peripheral edema    ED Discharge Orders         Ordered    furosemide (LASIX) 20 MG tablet  Daily  12/04/17 1309           Davonna Belling, MD 12/04/17 1623

## 2017-12-04 NOTE — ED Triage Notes (Signed)
Patient arrived by EMS from home with left lower abdominal pain(pain score of 10) X 5 days.  He also reports difficulty urinating-burning and itching when he urinates.  Also reports constipation, last bowel movement yesterday-he states that it "felt like bricks"  Patient asked RN "why is my foot swollen like that?" patient's left foot appears swollen- pulses present bilaterally

## 2017-12-04 NOTE — ED Notes (Signed)
ED Provider at bedside. 

## 2017-12-30 ENCOUNTER — Encounter (HOSPITAL_COMMUNITY): Payer: Self-pay | Admitting: Emergency Medicine

## 2017-12-30 ENCOUNTER — Emergency Department (HOSPITAL_COMMUNITY)
Admission: EM | Admit: 2017-12-30 | Discharge: 2017-12-30 | Disposition: A | Discharge status: Home / Self Care | Payer: Medicare Other | Attending: Emergency Medicine | Admitting: Emergency Medicine

## 2017-12-30 DIAGNOSIS — Z8679 Personal history of other diseases of the circulatory system: Secondary | ICD-10-CM | POA: Diagnosis not present

## 2017-12-30 DIAGNOSIS — J449 Chronic obstructive pulmonary disease, unspecified: Secondary | ICD-10-CM | POA: Diagnosis not present

## 2017-12-30 DIAGNOSIS — Z951 Presence of aortocoronary bypass graft: Secondary | ICD-10-CM | POA: Diagnosis not present

## 2017-12-30 DIAGNOSIS — R1084 Generalized abdominal pain: Secondary | ICD-10-CM | POA: Diagnosis not present

## 2017-12-30 DIAGNOSIS — E860 Dehydration: Secondary | ICD-10-CM | POA: Insufficient documentation

## 2017-12-30 DIAGNOSIS — I2510 Atherosclerotic heart disease of native coronary artery without angina pectoris: Secondary | ICD-10-CM | POA: Insufficient documentation

## 2017-12-30 DIAGNOSIS — Z8673 Personal history of transient ischemic attack (TIA), and cerebral infarction without residual deficits: Secondary | ICD-10-CM | POA: Insufficient documentation

## 2017-12-30 DIAGNOSIS — N401 Enlarged prostate with lower urinary tract symptoms: Secondary | ICD-10-CM | POA: Diagnosis not present

## 2017-12-30 DIAGNOSIS — R339 Retention of urine, unspecified: Secondary | ICD-10-CM | POA: Diagnosis present

## 2017-12-30 DIAGNOSIS — I10 Essential (primary) hypertension: Secondary | ICD-10-CM | POA: Insufficient documentation

## 2017-12-30 DIAGNOSIS — Z87891 Personal history of nicotine dependence: Secondary | ICD-10-CM | POA: Diagnosis not present

## 2017-12-30 DIAGNOSIS — I252 Old myocardial infarction: Secondary | ICD-10-CM | POA: Insufficient documentation

## 2017-12-30 DIAGNOSIS — R609 Edema, unspecified: Secondary | ICD-10-CM | POA: Diagnosis not present

## 2017-12-30 LAB — CBC WITH DIFFERENTIAL/PLATELET
Abs Immature Granulocytes: 0 10*3/uL (ref 0.0–0.1)
Basophils Absolute: 0 10*3/uL (ref 0.0–0.1)
Basophils Relative: 1 %
EOS PCT: 1 %
Eosinophils Absolute: 0 10*3/uL (ref 0.0–0.7)
HEMATOCRIT: 54 % — AB (ref 39.0–52.0)
Hemoglobin: 17.3 g/dL — ABNORMAL HIGH (ref 13.0–17.0)
Immature Granulocytes: 0 %
LYMPHS ABS: 1.3 10*3/uL (ref 0.7–4.0)
Lymphocytes Relative: 22 %
MCH: 32.6 pg (ref 26.0–34.0)
MCHC: 32 g/dL (ref 30.0–36.0)
MCV: 101.7 fL — AB (ref 78.0–100.0)
MONOS PCT: 9 %
Monocytes Absolute: 0.5 10*3/uL (ref 0.1–1.0)
NEUTROS ABS: 4.1 10*3/uL (ref 1.7–7.7)
NEUTROS PCT: 67 %
Platelets: 186 10*3/uL (ref 150–400)
RBC: 5.31 MIL/uL (ref 4.22–5.81)
RDW: 12.6 % (ref 11.5–15.5)
WBC: 6 10*3/uL (ref 4.0–10.5)

## 2017-12-30 LAB — URINALYSIS, ROUTINE W REFLEX MICROSCOPIC
Bilirubin Urine: NEGATIVE
Glucose, UA: NEGATIVE mg/dL
Hgb urine dipstick: NEGATIVE
KETONES UR: 5 mg/dL — AB
Leukocytes, UA: NEGATIVE
Nitrite: NEGATIVE
PH: 5 (ref 5.0–8.0)
Protein, ur: NEGATIVE mg/dL
Specific Gravity, Urine: 1.017 (ref 1.005–1.030)

## 2017-12-30 LAB — COMPREHENSIVE METABOLIC PANEL
ALK PHOS: 87 U/L (ref 38–126)
ALT: 14 U/L (ref 0–44)
ANION GAP: 10 (ref 5–15)
AST: 18 U/L (ref 15–41)
Albumin: 3.8 g/dL (ref 3.5–5.0)
BUN: 7 mg/dL — ABNORMAL LOW (ref 8–23)
CALCIUM: 9.4 mg/dL (ref 8.9–10.3)
CO2: 26 mmol/L (ref 22–32)
CREATININE: 1.08 mg/dL (ref 0.61–1.24)
Chloride: 104 mmol/L (ref 98–111)
GFR calc non Af Amer: >60 mL/min (ref >60)
Glucose, Bld: 148 mg/dL — ABNORMAL HIGH (ref 70–99)
Potassium: 3.7 mmol/L (ref 3.5–5.1)
SODIUM: 140 mmol/L (ref 135–145)
TOTAL PROTEIN: 7.3 g/dL (ref 6.5–8.1)
Total Bilirubin: 0.6 mg/dL (ref 0.3–1.2)

## 2017-12-30 LAB — BRAIN NATRIURETIC PEPTIDE: B Natriuretic Peptide: 18.4 pg/mL (ref 0.0–100.0)

## 2017-12-30 LAB — I-STAT CG4 LACTIC ACID, ED: Lactic Acid, Venous: 2.4 mmol/L (ref 0.5–1.9)

## 2017-12-30 MED ORDER — SODIUM CHLORIDE 0.9 % IV BOLUS
1000.0000 mL | Freq: Once | INTRAVENOUS | Status: AC
  Administered 2017-12-30: 1000 mL via INTRAVENOUS

## 2017-12-30 MED ORDER — GABAPENTIN 600 MG PO TABS
300.0000 mg | ORAL_TABLET | Freq: Once | ORAL | Status: DC
  Filled 2017-12-30: qty 0.5

## 2017-12-30 MED ORDER — ONDANSETRON 4 MG PO TBDP
4.0000 mg | ORAL_TABLET | Freq: Once | ORAL | Status: AC
  Administered 2017-12-30: 4 mg via ORAL
  Filled 2017-12-30: qty 1

## 2017-12-30 MED ORDER — GABAPENTIN 300 MG PO CAPS
300.0000 mg | ORAL_CAPSULE | Freq: Once | ORAL | Status: AC
  Administered 2017-12-30: 300 mg via ORAL
  Filled 2017-12-30: qty 1

## 2017-12-30 NOTE — ED Notes (Signed)
Bladder scan patient it read 365ml patient is resting with call bell in reach notified RN Ronalee Belts and Dr. Sedonia Small of the scan

## 2017-12-30 NOTE — ED Provider Notes (Signed)
[facility] Emergency Department Provider Note MRN:  [id number]  Arrival date & time: 12/30/17     Chief Complaint   Leg Swelling and Urinary Retention   History of Present Illness   Chris Payne is a 67 y.o. year-old male with a history of COPD, CAD presenting to the ED with chief complaint of urinary retention.  Patient is endorsing a burning sensation of the abdomen radiating down to the legs for several months.  Endorsing decreased urine production for 3 to 4 days, straining with urination, dark urine.  Feels a pressure sensation in the lower abdomen.  Denies fevers or chills, no headache or vision change, no chest pain or shortness of breath.  Endorsing some burning with urination.  Endorsing several weeks of increased swelling to the lower extremities.  Symptoms are moderate in severity, constant.  No exacerbating or alleviating factors.  Review of Systems  A complete 10 system review of systems was obtained and all systems are negative except as noted in the HPI and PMH.   Patient's Health History    Past Medical History:  Diagnosis Date  . AAA (abdominal aortic aneurysm) ([place])    s/p repair 6/11  . CAD (coronary artery disease)    s/p CABG 2/12:  LIMA to LAD, SVG to diagonal-1, SVG to ramus intermedius,, SVG to AM (Dr. Roxan Hockey) ;  b.  Myoview 10/13:  inf infarct with very mild peri-infarct ischemia, EF 49%, inf HK; c  He has severe three-vessel native coronary artery disease. Catheterization March 2015 as above  . CAP (community acquired pneumonia) 02/23/2014  . Carotid stenosis    a.  dopplers 1/93: LICA 79-02%;  b. Carotid dopplers 3/14:  R 0-39%, L 60-79%, repeat 6 mos  . COPD (chronic obstructive pulmonary disease) ([place])   . GERD (gastroesophageal reflux disease)   . Headache(784.0)   . Hyperlipidemia   . Hypertension   . Hypoxia 02/23/2014  . Inguinal hernia   . Myocardial infarction [facility])    PCI x3  . PAD (peripheral artery disease) ([place])      ABIs 4/12:  R 0.88, L 0.92; R SFA 40%, L CFA 50%, L SFA 50-60%  . Pneumonia    hx  . Stroke [facility])    h/o pontine CVA  . Thyroid nodule    incidental finding on carotid doppler 3/14 => thyroid U/S ordered    Past Surgical History:  Procedure Laterality Date  . ABDOMINAL AORTIC ANEURYSM REPAIR  10-03-2009  . CORONARY ARTERY BYPASS GRAFT  06/24/2010   LIMA to the LAD, SVG to first diagonal, SVG to ramus intermediate, SVG to acute marginal. EF 50%. 2/12  . EXPLORATION POST OPERATIVE OPEN HEART    . EXPLORATORY LAPAROTOMY  09/2009   ligation of lumbar arteries  . EXTERNAL EAR SURGERY Left    laceration child  . HIP SURGERY  40 years ago   left hip bone removal and pinning  . INGUINAL HERNIA REPAIR Left 07/21/2012   Procedure: HERNIA REPAIR INGUINAL ADULT;  Surgeon: Merrie Roof, MD;  Location: [place];  Service: General;  Laterality: Left;  . INSERTION OF MESH Left 07/21/2012   Procedure: INSERTION OF MESH;  Surgeon: Merrie Roof, MD;  Location: [place];  Service: General;  Laterality: Left;  . LEFT HEART CATHETERIZATION WITH CORONARY/GRAFT ANGIOGRAM  06/21/2012   Procedure: LEFT HEART CATHETERIZATION WITH Beatrix Fetters;  Surgeon: Minus Breeding, MD;  Location: [facility] CATH LAB;  Service: Cardiovascular;;  . LEFT  HEART CATHETERIZATION WITH CORONARY/GRAFT ANGIOGRAM N/A 07/04/2013   Procedure: LEFT HEART CATHETERIZATION WITH Beatrix Fetters;  Surgeon: Sinclair Grooms, MD;  Location: [facility] CATH LAB;  Service: Cardiovascular;  Laterality: N/A;  . NM MYOCAR PERF WALL MOTION  12/02/2010   inferoapical defect is fixed c/w prior infarction/scar, there is minimal borderzone ischemia.    Family History  Problem Relation Age of Onset  . Alcohol abuse Father   . Cancer Sister 73       unknown primary  . Diabetes Mother   . Hypertension Mother   . Cancer Sister 61       kidney cancer    Social History   Socioeconomic History  . Marital status: Married    Spouse name: Rod Holler  .  Number of children: 0  . Years of education: 10  . Highest education level: Not on file  Occupational History  . Occupation: disabled    Comment: back pain, hernia, CAD, AAA, Stroke  Social Needs  . Financial resource strain: Not on file  . Food insecurity:    Worry: Not on file    Inability: Not on file  . Transportation needs:    Medical: Not on file    Non-medical: Not on file  Tobacco Use  . Smoking status: Former Smoker    Packs/day: 1.00    Years: 30.00    Pack years: 30.00    Types: Cigarettes    Last attempt to quit: 01/25/2010    Years since quitting: 7.9  . Smokeless tobacco: Never Used  Substance and Sexual Activity  . Alcohol use: No    Comment: former drinker - Quit 2011.  . Drug use: No  . Sexual activity: Never  Lifestyle  . Physical activity:    Days per week: Not on file    Minutes per session: Not on file  . Stress: Not on file  Relationships  . Social connections:    Talks on phone: Not on file    Gets together: Not on file    Attends religious service: Not on file    Active member of club or organization: Not on file    Attends meetings of clubs or organizations: Not on file    Relationship status: Not on file  . Intimate partner violence:    Fear of current or ex partner: Not on file    Emotionally abused: Not on file    Physically abused: Not on file    Forced sexual activity: Not on file  Other Topics Concern  . Not on file  Social History Narrative   Marital status: married x 16 years; first marriage      Children:  None; 3 stepchildren; 4 grandchildren.  No gg.      Lives: with wife, Joslyn Hy and his girlfriend.      Employment: disability since 2010 CABG, AAA, CVA.  Previous furniture work.      Tobacco:  Quit in 2009; smoked 30 years.      Alcohol: quit in 2009; weekend drinking      ADLs: uses rolling walker within the home; has four pronged walker at home; has drivers license yet no car.  Cooks; no cleaning; no yard work.    Education  10th grade.   Right handed.   Caffeine five cups of coffee daily.      Advanced Directives:  DNR/DNI.       Physical Exam  Vital Signs and Nursing Notes reviewed Vitals:   12/30/17 1330  12/30/17 1415  BP: (!) 166/86 (!) 184/95  Pulse: 65 60  Resp:    Temp:    SpO2: 97% 97%    CONSTITUTIONAL: Chronically ill-appearing, NAD NEURO:  Alert and oriented x 3, no focal deficits EYES:  eyes equal and reactive ENT/NECK:  no LAD, no JVD CARDIO: Regular rate, well-perfused, normal S1 and S2 PULM:  CTAB no wheezing or rhonchi GI/GU:  normal bowel sounds, non-distended, non-tender MSK/SPINE:  No gross deformities, no edema SKIN:  no rash, atraumatic PSYCH:  Appropriate speech and behavior  Diagnostic and Interventional Summary    EKG Interpretation  Date/Time:    Ventricular Rate:    PR Interval:    QRS Duration:   QT Interval:    QTC Calculation:   R Axis:     Text Interpretation:        Labs Reviewed  COMPREHENSIVE METABOLIC PANEL - Abnormal; Notable for the following components:      Result Value   Glucose, Bld 148 (*)    BUN 7 (*)    All other components within normal limits  CBC WITH DIFFERENTIAL/PLATELET - Abnormal; Notable for the following components:   Hemoglobin 17.3 (*)    HCT 54.0 (*)    MCV 101.7 (*)    All other components within normal limits  URINALYSIS, ROUTINE W REFLEX MICROSCOPIC - Abnormal; Notable for the following components:   APPearance HAZY (*)    Ketones, ur 5 (*)    All other components within normal limits  I-STAT CG4 LACTIC ACID, ED - Abnormal; Notable for the following components:   Lactic Acid, Venous 2.40 (*)    All other components within normal limits  BRAIN NATRIURETIC PEPTIDE    No orders to display    Medications  gabapentin (NEURONTIN) capsule 300 mg (300 mg Oral Given 12/30/17 1400)  ondansetron (ZOFRAN-ODT) disintegrating tablet 4 mg (4 mg Oral Given 12/30/17 1345)  sodium chloride 0.9 % bolus 1,000 mL (1,000 mLs Intravenous  New Bag/Given 12/30/17 1358)     Procedures Critical Care  ED Course and Medical Decision Making  I have reviewed the triage vital signs and the nursing notes.  Pertinent labs & imaging results that were available during my care of the patient were reviewed by me and considered in my medical decision making (see below for details).  Question of urinary retention related to BPH in the 67 year old male with history of BPH.  Explains that he has required Foley catheter in the past.  Unclear etiology of patient's burning sensation to his abdomen and legs, which has been present for several months, likely neuropathic pain for which she is taking gabapentin at home.  Abdomen is soft and nontender.  Bladder scan, urinalysis, Foley catheter if needed, will reassess.  Clinical Course as of Dec 31 1546  Thu Dec 30, 2017  1413 Upon reassessment patient feeling well, reassuring repeat abdominal exam.  Patient explains that his abdominal burning pain is chronic, unchanged from last ED visit during which she had a CT demonstrating a repaired aorta with no emergent findings.   [MB]    Clinical Course User Index [MB] Maudie Flakes, MD    Initial bladder scan reveals 15 cc.  Patient given 1 L IV fluids.  Repeat bladder scan reveals 2 to 300 cc.  Patient able to urinate here in the ED, does not appear to be retaining to the point of needing Foley catheter.  Labs are reassuring, no AKI.  Chronic burning pain at baseline.  Patient admits to drinking only 1 or 2 glasses of water per day.  Advised more water at home, will follow up with primary care provider, will return if symptoms worsen.  Barth Kirks. Sedonia Small, MD [place] [email or web address]  Final Clinical Impressions(s) / ED Diagnoses     ICD-10-CM   1. Dehydration E86.0   2. Benign prostatic hyperplasia with lower urinary tract symptoms, symptom details unspecified N40.1     ED Discharge Orders    None           Maudie Flakes, MD 12/30/17 [phone]

## 2017-12-30 NOTE — Discharge Instructions (Addendum)
You were evaluated in the Emergency Department and after careful evaluation, we did not find any emergent condition requiring admission or further testing in the hospital.  Your symptoms today seem to be due to dehydration.  Drink more water at home and follow-up with your primary care provider.  Please return to the Emergency Department if you experience any worsening of your condition.  We encourage you to follow up with a primary care provider.  Thank you for allowing [place] to be a part of your care.

## 2017-12-30 NOTE — ED Triage Notes (Addendum)
Pt arrives via [facility] for bilateral leg swelling and burning as well as some abdominal swelling x6 months. Saw pcp for legs and was given gabapentin and was also given lasix in the ED about a week ago, per pts report, ems states pt reported approx 25 cc of urine output in the past 24 hours. No renal issues that patient is aware of.

## 2017-12-30 NOTE — ED Notes (Signed)
Pt given d/c instructions and taken to lobby via wheelchair. Pt is is alert and oriented x4. No concerns expressed at time of discharge.

## 2018-01-04 DIAGNOSIS — L219 Seborrheic dermatitis, unspecified: Secondary | ICD-10-CM | POA: Diagnosis not present

## 2018-01-04 DIAGNOSIS — L299 Pruritus, unspecified: Secondary | ICD-10-CM | POA: Diagnosis not present

## 2018-01-04 DIAGNOSIS — R233 Spontaneous ecchymoses: Secondary | ICD-10-CM | POA: Diagnosis not present

## 2018-02-07 DIAGNOSIS — Z1212 Encounter for screening for malignant neoplasm of rectum: Secondary | ICD-10-CM | POA: Diagnosis not present

## 2018-02-07 DIAGNOSIS — Z1211 Encounter for screening for malignant neoplasm of colon: Secondary | ICD-10-CM | POA: Diagnosis not present

## 2018-02-28 DIAGNOSIS — I10 Essential (primary) hypertension: Secondary | ICD-10-CM | POA: Diagnosis not present

## 2018-02-28 DIAGNOSIS — J988 Other specified respiratory disorders: Secondary | ICD-10-CM | POA: Diagnosis not present

## 2018-02-28 DIAGNOSIS — Z0100 Encounter for examination of eyes and vision without abnormal findings: Secondary | ICD-10-CM | POA: Diagnosis not present

## 2018-02-28 DIAGNOSIS — Z0000 Encounter for general adult medical examination without abnormal findings: Secondary | ICD-10-CM | POA: Diagnosis not present

## 2018-02-28 DIAGNOSIS — Z131 Encounter for screening for diabetes mellitus: Secondary | ICD-10-CM | POA: Diagnosis not present

## 2018-02-28 DIAGNOSIS — E785 Hyperlipidemia, unspecified: Secondary | ICD-10-CM | POA: Diagnosis not present

## 2018-02-28 DIAGNOSIS — Z23 Encounter for immunization: Secondary | ICD-10-CM | POA: Diagnosis not present

## 2018-02-28 DIAGNOSIS — Z0110 Encounter for examination of ears and hearing without abnormal findings: Secondary | ICD-10-CM | POA: Diagnosis not present

## 2018-04-11 DIAGNOSIS — R600 Localized edema: Secondary | ICD-10-CM | POA: Diagnosis not present

## 2018-04-11 DIAGNOSIS — R7303 Prediabetes: Secondary | ICD-10-CM | POA: Diagnosis not present

## 2018-04-11 DIAGNOSIS — K591 Functional diarrhea: Secondary | ICD-10-CM | POA: Diagnosis not present

## 2018-04-11 DIAGNOSIS — I10 Essential (primary) hypertension: Secondary | ICD-10-CM | POA: Diagnosis not present

## 2018-04-11 DIAGNOSIS — E785 Hyperlipidemia, unspecified: Secondary | ICD-10-CM | POA: Diagnosis not present

## 2018-04-25 ENCOUNTER — Other Ambulatory Visit: Payer: Self-pay

## 2018-04-25 ENCOUNTER — Emergency Department (HOSPITAL_COMMUNITY): Payer: Medicare Other

## 2018-04-25 ENCOUNTER — Emergency Department (HOSPITAL_COMMUNITY)
Admission: EM | Admit: 2018-04-25 | Discharge: 2018-04-25 | Disposition: A | Discharge status: Home / Self Care | Payer: Medicare Other | Attending: Emergency Medicine | Admitting: Emergency Medicine

## 2018-04-25 ENCOUNTER — Encounter (HOSPITAL_COMMUNITY): Payer: Self-pay | Admitting: Emergency Medicine

## 2018-04-25 DIAGNOSIS — R21 Rash and other nonspecific skin eruption: Secondary | ICD-10-CM | POA: Diagnosis not present

## 2018-04-25 DIAGNOSIS — R062 Wheezing: Secondary | ICD-10-CM | POA: Diagnosis not present

## 2018-04-25 DIAGNOSIS — R000 Tachycardia, unspecified: Secondary | ICD-10-CM | POA: Diagnosis not present

## 2018-04-25 DIAGNOSIS — Z79899 Other long term (current) drug therapy: Secondary | ICD-10-CM | POA: Insufficient documentation

## 2018-04-25 DIAGNOSIS — R05 Cough: Secondary | ICD-10-CM | POA: Diagnosis not present

## 2018-04-25 DIAGNOSIS — R0602 Shortness of breath: Secondary | ICD-10-CM | POA: Diagnosis not present

## 2018-04-25 DIAGNOSIS — I10 Essential (primary) hypertension: Secondary | ICD-10-CM | POA: Insufficient documentation

## 2018-04-25 DIAGNOSIS — I252 Old myocardial infarction: Secondary | ICD-10-CM | POA: Insufficient documentation

## 2018-04-25 DIAGNOSIS — I2510 Atherosclerotic heart disease of native coronary artery without angina pectoris: Secondary | ICD-10-CM | POA: Insufficient documentation

## 2018-04-25 DIAGNOSIS — Z87891 Personal history of nicotine dependence: Secondary | ICD-10-CM | POA: Diagnosis not present

## 2018-04-25 DIAGNOSIS — R0902 Hypoxemia: Secondary | ICD-10-CM | POA: Diagnosis not present

## 2018-04-25 DIAGNOSIS — J441 Chronic obstructive pulmonary disease with (acute) exacerbation: Secondary | ICD-10-CM | POA: Diagnosis not present

## 2018-04-25 LAB — BASIC METABOLIC PANEL
Anion gap: 11 (ref 5–15)
BUN: 11 mg/dL (ref 8–23)
CO2: 28 mmol/L (ref 22–32)
Calcium: 8.9 mg/dL (ref 8.9–10.3)
Chloride: 101 mmol/L (ref 98–111)
Creatinine, Ser: 0.89 mg/dL (ref 0.61–1.24)
GFR calc Af Amer: >60 mL/min (ref >60)
GFR calc non Af Amer: >60 mL/min (ref >60)
Glucose, Bld: 119 mg/dL — ABNORMAL HIGH (ref 70–99)
Potassium: 3.4 mmol/L — ABNORMAL LOW (ref 3.5–5.1)
Sodium: 140 mmol/L (ref 135–145)

## 2018-04-25 LAB — CBC WITH DIFFERENTIAL/PLATELET
Abs Immature Granulocytes: 0.03 10*3/uL (ref 0.00–0.07)
BASOS ABS: 0 10*3/uL (ref 0.0–0.1)
Basophils Relative: 0 %
EOS ABS: 0.1 10*3/uL (ref 0.0–0.5)
Eosinophils Relative: 1 %
HCT: 51.2 % (ref 39.0–52.0)
Hemoglobin: 16.3 g/dL (ref 13.0–17.0)
Immature Granulocytes: 0 %
LYMPHS ABS: 1.1 10*3/uL (ref 0.7–4.0)
Lymphocytes Relative: 13 %
MCH: 33.5 pg (ref 26.0–34.0)
MCHC: 31.8 g/dL (ref 30.0–36.0)
MCV: 105.3 fL — ABNORMAL HIGH (ref 80.0–100.0)
Monocytes Absolute: 0.7 10*3/uL (ref 0.1–1.0)
Monocytes Relative: 8 %
NRBC: 0 % (ref 0.0–0.2)
Neutro Abs: 6.5 10*3/uL (ref 1.7–7.7)
Neutrophils Relative %: 78 %
Platelets: 185 10*3/uL (ref 150–400)
RBC: 4.86 MIL/uL (ref 4.22–5.81)
RDW: 12.1 % (ref 11.5–15.5)
WBC: 8.4 10*3/uL (ref 4.0–10.5)

## 2018-04-25 LAB — I-STAT TROPONIN, ED: Troponin i, poc: 0 ng/mL (ref 0.00–0.08)

## 2018-04-25 LAB — URINALYSIS, ROUTINE W REFLEX MICROSCOPIC
Bilirubin Urine: NEGATIVE
Glucose, UA: NEGATIVE mg/dL
HGB URINE DIPSTICK: NEGATIVE
Ketones, ur: NEGATIVE mg/dL
Leukocytes, UA: NEGATIVE
Nitrite: NEGATIVE
Protein, ur: NEGATIVE mg/dL
Specific Gravity, Urine: 1.008 (ref 1.005–1.030)
pH: 7 (ref 5.0–8.0)

## 2018-04-25 MED ORDER — METHYLPREDNISOLONE SODIUM SUCC 125 MG IJ SOLR
125.0000 mg | Freq: Once | INTRAMUSCULAR | Status: AC
  Administered 2018-04-25: 125 mg via INTRAVENOUS
  Filled 2018-04-25: qty 2

## 2018-04-25 MED ORDER — ALBUTEROL SULFATE HFA 108 (90 BASE) MCG/ACT IN AERS
1.0000 | INHALATION_SPRAY | Freq: Once | RESPIRATORY_TRACT | Status: AC
  Administered 2018-04-25: 1 via RESPIRATORY_TRACT
  Filled 2018-04-25: qty 6.7

## 2018-04-25 MED ORDER — PREDNISONE 10 MG PO TABS: 40.0000 mg | ORAL_TABLET | Freq: Every day | ORAL | 0 refills | Status: AC

## 2018-04-25 MED ORDER — IPRATROPIUM-ALBUTEROL 0.5-2.5 (3) MG/3ML IN SOLN
3.0000 mL | Freq: Once | RESPIRATORY_TRACT | Status: AC
  Administered 2018-04-25: 3 mL via RESPIRATORY_TRACT
  Filled 2018-04-25: qty 3

## 2018-04-25 NOTE — ED Notes (Signed)
Patient verbalizes understanding of discharge instructions. Opportunity for questioning and answers were provided. Armband removed by staff, pt discharged from ED. Wheeled out to lobby  

## 2018-04-25 NOTE — ED Triage Notes (Signed)
Pt in from home with sob x 2 wks, and c/o groin pain/mass x 3 mos. C/o productive cough w/chest rattling x 2 wks, had 10mg  Albuterol & 0.5mg  Atrovent PTA. sats 95% on RA, denies any fevers

## 2018-04-25 NOTE — ED Provider Notes (Signed)
[facility] EMERGENCY DEPARTMENT Provider Note   CSN: [id number] Arrival date & time: 04/25/18  1243     History   Chief Complaint Chief Complaint  Patient presents with  . Shortness of Breath  . Groin Pain    HPI Chris Payne is a 67 y.o. male with a past medical history of CAD, COPD, hypertension, hyperlipidemia, prior MI, prior CVA who presents to ED for multiple complaints. 1.  His first complaint is shortness of breath.  States that symptoms have been going on for 1 month and have improved with his albuterol inhaler.  Apparently he ran out of his inhaler today she had worsening of his symptoms.  He reports chest tightness and wheezing as well as a dry cough 2/2 sob.  No sick contacts with similar symptoms. Denies chest pain. 2.  He also complains of left groin pain.  Symptoms have been going on for the past several months.  He does not know what the cause of his symptoms are.  He used a cream prescribed by the ED several months ago which "helped with my heat rash there."  Reports dysuria but denies any hematuria, abdominal pain.  HPI  Past Medical History:  Diagnosis Date  . AAA (abdominal aortic aneurysm) ([place])    s/p repair 6/11  . CAD (coronary artery disease)    s/p CABG 2/12:  LIMA to LAD, SVG to diagonal-1, SVG to ramus intermedius,, SVG to AM (Dr. Roxan Hockey) ;  b.  Myoview 10/13:  inf infarct with very mild peri-infarct ischemia, EF 49%, inf HK; c  He has severe three-vessel native coronary artery disease. Catheterization March 2015 as above  . CAP (community acquired pneumonia) 02/23/2014  . Carotid stenosis    a.  dopplers 3/50: LICA 09-38%;  b. Carotid dopplers 3/14:  R 0-39%, L 60-79%, repeat 6 mos  . COPD (chronic obstructive pulmonary disease) ([place])   . GERD (gastroesophageal reflux disease)   . Headache(784.0)   . Hyperlipidemia   . Hypertension   . Hypoxia 02/23/2014  . Inguinal hernia   . Myocardial infarction [facility])    PCI x3  .  PAD (peripheral artery disease) ([place])    ABIs 4/12:  R 0.88, L 0.92; R SFA 40%, L CFA 50%, L SFA 50-60%  . Pneumonia    hx  . Stroke [facility])    h/o pontine CVA  . Thyroid nodule    incidental finding on carotid doppler 3/14 => thyroid U/S ordered    Patient Active Problem List   Diagnosis Date Noted  . Chronic pain syndrome 07/19/2015  . Degenerative disc disease, lumbar 07/19/2015  . Weakness 04/17/2015  . Generalized weakness 04/17/2015  . Polycythemia 04/17/2015  . BPH (benign prostatic hyperplasia) 04/17/2015  . Seizure disorder ([place]) 10/12/2014  . TIA (transient ischemic attack) 08/23/2013  . Other and unspecified angina pectoris 07/04/2013  . Nontoxic multinodular goiter 10/07/2012  . Unstable angina ([place]) 06/19/2012  . Carotid stenosis   . PAD (peripheral artery disease) ([place])   . Left inguinal hernia 05/30/2012  . COPD (chronic obstructive pulmonary disease) ([place]) 08/24/2011  . S/P AAA repair 08/24/2011  . Lumbar disc disease   . Hypertensive heart disease   . Hyperlipidemia   . CAD (coronary artery disease)   . History of pontine CVA   . GERD (gastroesophageal reflux disease)     Past Surgical History:  Procedure Laterality Date  . ABDOMINAL AORTIC ANEURYSM REPAIR  10-03-2009  . CORONARY ARTERY BYPASS GRAFT  06/24/2010   LIMA to the LAD, SVG to first diagonal, SVG to ramus intermediate, SVG to acute marginal. EF 50%. 2/12  . EXPLORATION POST OPERATIVE OPEN HEART    . EXPLORATORY LAPAROTOMY  09/2009   ligation of lumbar arteries  . EXTERNAL EAR SURGERY Left    laceration child  . HIP SURGERY  40 years ago   left hip bone removal and pinning  . INGUINAL HERNIA REPAIR Left 07/21/2012   Procedure: HERNIA REPAIR INGUINAL ADULT;  Surgeon: Merrie Roof, MD;  Location: [place];  Service: General;  Laterality: Left;  . INSERTION OF MESH Left 07/21/2012   Procedure: INSERTION OF MESH;  Surgeon: Merrie Roof, MD;  Location: [place];  Service: General;  Laterality: Left;  .  LEFT HEART CATHETERIZATION WITH CORONARY/GRAFT ANGIOGRAM  06/21/2012   Procedure: LEFT HEART CATHETERIZATION WITH Beatrix Fetters;  Surgeon: Minus Breeding, MD;  Location: [facility] C[facility] LAB;  Service: Cardiovascular;;  . LEFT HEART CATHETERIZATION WITH CORONARY/GRAFT ANGIOGRAM N/A 07/04/2013   Procedure: LEFT HEART CATHETERIZATION WITH Beatrix Fetters;  Surgeon: Sinclair Grooms, MD;  Location: [facility] CATH LAB;  Service: Cardiovascular;  Laterality: N/A;  . NM MYOCAR PERF WALL MOTION  12/02/2010   inferoapical defect is fixed c/w prior infarction/scar, there is minimal borderzone ischemia.        Home Medications    Prior to Admission medications   Medication Sig Start Date End Date Taking? Authorizing Provider  furosemide (LASIX) 20 MG tablet Take 1 tablet (20 mg total) by mouth daily. 12/04/17  Yes Davonna Belling, MD  gabapentin (NEURONTIN) 100 MG capsule Take 1 capsule (100 mg total) by mouth 3 (three) times daily. Patient taking differently: Take 300 mg by mouth 2 (two) times daily.  05/24/15  Yes Wardell Honour, MD  ibuprofen (ADVIL,MOTRIN) 100 MG tablet Take 200 mg by mouth every 6 (six) hours as needed for pain or fever.   Yes [provider]  pantoprazole (PROTONIX) 40 MG tablet Take 40 mg by mouth daily.   Yes [provider]  HYDROcodone-acetaminophen (NORCO) 10-325 MG tablet Take 1 tablet by mouth 2 (two) times daily as needed for severe pain. Patient not taking: Reported on 05/20/2017 05/24/15   Wardell Honour, MD  isosorbide mononitrate (IMDUR) 120 MG 24 hr tablet Take 1 tablet (120 mg total) by mouth daily. PLEASE CONTACT OFFICE FOR ADDITIONAL REFILLS Patient not taking: Reported on 05/20/2017 09/16/15   Minus Breeding, MD  levETIRAcetam (KEPPRA) 500 MG tablet TAKE ONE TABLET BY MOUTH TWICE DAILY Patient not taking: Reported on 05/20/2017 09/14/15   Wardell Honour, MD  morphine (MS CONTIN) 30 MG 12 hr tablet Take 1 tablet (30 mg total) by mouth every 12  (twelve) hours. Patient not taking: Reported on 05/20/2017 05/24/15   Wardell Honour, MD  predniSONE (DELTASONE) 10 MG tablet Take 4 tablets (40 mg total) by mouth daily for 5 days. 04/25/18 04/30/18  Skyra Crichlow, PA-C  ranolazine (RANEXA) 1000 MG SR tablet Take 1 tablet (1,000 mg total) by mouth 2 (two) times daily. Please schedule appointment for refills. Patient not taking: Reported on 05/20/2017 07/12/15   Minus Breeding, MD  rosuvastatin (CRESTOR) 40 MG tablet Take 1 tablet (40 mg total) by mouth daily. Please schedule appointment for refills. Patient not taking: Reported on 05/20/2017 01/14/16   Minus Breeding, MD    Family History Family History  Problem Relation Age of Onset  . Alcohol abuse Father   . Cancer Sister  58       unknown primary  . Diabetes Mother   . Hypertension Mother   . Cancer Sister 70       kidney cancer    Social History Social History   Tobacco Use  . Smoking status: Former Smoker    Packs/day: 1.00    Years: 30.00    Pack years: 30.00    Types: Cigarettes    Last attempt to quit: 01/25/2010    Years since quitting: 8.2  . Smokeless tobacco: Never Used  Substance Use Topics  . Alcohol use: No    Comment: former drinker - Quit 2011.  . Drug use: No     Allergies   Patient has no known allergies.   Review of Systems Review of Systems  Constitutional: Negative for appetite change, chills and fever.  HENT: Negative for ear pain, rhinorrhea, sneezing and sore throat.   Eyes: Negative for photophobia and visual disturbance.  Respiratory: Positive for cough, chest tightness and shortness of breath. Negative for wheezing.   Cardiovascular: Negative for chest pain and palpitations.  Gastrointestinal: Negative for abdominal pain, blood in stool, constipation, diarrhea, nausea and vomiting.  Genitourinary: Negative for dysuria, hematuria and urgency.       +L groin pain  Musculoskeletal: Negative for myalgias.  Skin: Negative for rash.    Neurological: Negative for dizziness, weakness and light-headedness.     Physical Exam Updated Vital Signs BP (!) 127/92   Pulse 82   Temp 97.7 F (36.5 C) (Oral)   Resp 15   Ht 5\' 7"  (1.702 m)   Wt 77.1 kg   SpO2 94%   BMI 26.63 kg/m   Physical Exam Vitals signs and nursing note reviewed.  Constitutional:      General: He is not in acute distress.    Appearance: He is well-developed.  HENT:     Head: Normocephalic and atraumatic.     Nose: Nose normal.  Eyes:     General: No scleral icterus.       Left eye: No discharge.     Conjunctiva/sclera: Conjunctivae normal.  Neck:     Musculoskeletal: Normal range of motion and neck supple.  Cardiovascular:     Rate and Rhythm: Normal rate and regular rhythm.     Heart sounds: Normal heart sounds. No murmur. No friction rub. No gallop.   Pulmonary:     Effort: Pulmonary effort is normal. No respiratory distress.     Breath sounds: Examination of the right-upper field reveals wheezing. Examination of the left-upper field reveals wheezing. Examination of the right-middle field reveals wheezing. Examination of the left-middle field reveals wheezing. Examination of the right-lower field reveals wheezing. Examination of the left-lower field reveals wheezing. Wheezing (end expiratory) present.  Abdominal:     General: Bowel sounds are normal. There is no distension.     Palpations: Abdomen is soft.     Tenderness: There is no abdominal tenderness. There is no guarding.  Musculoskeletal: Normal range of motion.  Skin:    General: Skin is warm and dry.     Findings: No rash.     Comments: Dry, flaky skin noted in bilateral inguinal folds.  No erythema, tenderness palpation or hernia noted.  No testicular swelling or tenderness.  Neurological:     Mental Status: He is alert.     Motor: No abnormal muscle tone.     Coordination: Coordination normal.      ED Treatments / Results  Labs (all labs  ordered are listed, but only  abnormal results are displayed) Labs Reviewed  BASIC METABOLIC PANEL - Abnormal; Notable for the following components:      Result Value   Potassium 3.4 (*)    Glucose, Bld 119 (*)    All other components within normal limits  CBC WITH DIFFERENTIAL/PLATELET - Abnormal; Notable for the following components:   MCV 105.3 (*)    All other components within normal limits  URINALYSIS, ROUTINE W REFLEX MICROSCOPIC  I-STAT TROPONIN, ED    EKG EKG Interpretation  Date/Time:  Monday April 25 2018 12:52:25 EST Ventricular Rate:  93 PR Interval:    QRS Duration: 107 QT Interval:  392 QTC Calculation: 488 R Axis:   -26 Text Interpretation:  Sinus rhythm Borderline left axis deviation Low voltage, precordial leads Abnormal R-wave progression, early transition Borderline prolonged QT interval Baseline wander in lead(s) V6 Confirmed by Fredia Sorrow [phone]) on 04/25/2018 12:55:35 PM   Radiology Dg Chest 2 View  Result Date: 04/25/2018 CLINICAL DATA:  Shortness of breath, painful groin mass for 3 months EXAM: CHEST - 2 VIEW COMPARISON:  09/26/2017 FINDINGS: There is mild bilateral interstitial prominence. There is no focal consolidation. There is no pleural effusion or pneumothorax. The heart and mediastinal contours are unremarkable. The osseous structures are unremarkable. IMPRESSION: 1. Mild pulmonary vascular congestion. Electronically Signed   By: Kathreen Devoid   On: 04/25/2018 14:04    Procedures Procedures (including critical care time)  Medications Ordered in ED Medications  albuterol (PROVENTIL HFA;VENTOLIN HFA) 108 (90 Base) MCG/ACT inhaler 1 puff (has no administration in time range)  ipratropium-albuterol (DUONEB) 0.5-2.5 (3) MG/3ML nebulizer solution 3 mL (3 mLs Nebulization Given 04/25/18 1332)  methylPREDNISolone sodium succinate (SOLU-MEDROL) 125 mg/2 mL injection 125 mg (125 mg Intravenous Given 04/25/18 1434)     Initial Impression / Assessment and Plan / ED Course    I have reviewed the triage vital signs and the nursing notes.  Pertinent labs & imaging results that were available during my care of the patient were reviewed by me and considered in my medical decision making (see chart for details).  Clinical Course as of Apr 25 1546  Mon Apr 25, 2018  1340 Oral temp 97.7 F   [HK]  1513 Patient reports improvement in his symptoms with DuoNeb and Solu-Medrol.   [HK]    Clinical Course User Index [HK] Delia Heady, PA-C    67 year old male with a past medical history of COPD, who presents to ED for evaluation of multiple complaints.  He reports shortness of breath for the past month.  This is gradually worsened since he ran out of his inhaler today.  Usually controlled with his albuterol inhaler.  He reports chest tightness, wheezing.  He is also complaining of left groin pain.  Reports having a heat rash in this area in the past.  Denies any trauma to the area but does report some dysuria.  Wheezing noted on my exam globally.  He is satting at 95% on room air.  He reports some improvement with breathing treatments given by EMS.  Will give additional DuoNeb, obtain lab work, chest x-ray and urinalysis and reassess.  BMP, CBC, urinalysis unremarkable.  Troponin is negative.  CXR shows mild pulmonary vascular congestion.  EKG shows sinus rhythm with no ischemic changes, shows prolonged QT. Patient reports significant improvement in his symptoms with DuoNeb and Solu-Medrol.  Suspect that his symptoms are due to COPD exacerbation.  Will discharge home with steroids Dosepak,  refill of inhaler. Will advise OTC emollient for his dry skin in inguinal area. No evidence of infection.  Patient is hemodynamically stable, in NAD, and able to ambulate in the ED. Evaluation does not show pathology that would require ongoing emergent intervention or inpatient treatment. I explained the diagnosis to the patient. Pain has been managed and has no complaints prior to discharge.  Patient is comfortable with above plan and is stable for discharge at this time. All questions were answered prior to disposition. Strict return precautions for returning to the ED were discussed. Encouraged follow up with PCP.    Portions of this note were generated with [employer]. Dictation errors may occur despite best attempts at proofreading.   Final Clinical Impressions(s) / ED Diagnoses   Final diagnoses:  COPD exacerbation ([place])  Rash and nonspecific skin eruption    ED Discharge Orders         Ordered    predniSONE (DELTASONE) 10 MG tablet  Daily     04/25/18 1539           Delia Heady, PA-C 04/25/18 1550    Fredia Sorrow, MD 05/02/18 1150

## 2018-04-25 NOTE — Discharge Instructions (Addendum)
Take the steroids beginning tomorrow. Continue your other home medications as previously prescribed. Use your inhaler as needed. Return to ED for worsening symptoms, chest pain, increased shortness of breath, vomiting or coughing up blood or fever.

## 2018-05-21 ENCOUNTER — Emergency Department (HOSPITAL_COMMUNITY): Payer: Medicare Other

## 2018-05-21 ENCOUNTER — Other Ambulatory Visit: Payer: Self-pay

## 2018-05-21 ENCOUNTER — Observation Stay (HOSPITAL_COMMUNITY)
Admission: EM | Admit: 2018-05-21 | Discharge: 2018-05-23 | Disposition: A | Discharge status: Home / Self Care | Payer: Medicare Other | Attending: Internal Medicine | Admitting: Internal Medicine

## 2018-05-21 ENCOUNTER — Encounter (HOSPITAL_COMMUNITY): Payer: Self-pay | Admitting: *Deleted

## 2018-05-21 DIAGNOSIS — Z79899 Other long term (current) drug therapy: Secondary | ICD-10-CM | POA: Diagnosis not present

## 2018-05-21 DIAGNOSIS — R000 Tachycardia, unspecified: Secondary | ICD-10-CM | POA: Insufficient documentation

## 2018-05-21 DIAGNOSIS — R069 Unspecified abnormalities of breathing: Secondary | ICD-10-CM | POA: Diagnosis not present

## 2018-05-21 DIAGNOSIS — Z951 Presence of aortocoronary bypass graft: Secondary | ICD-10-CM | POA: Diagnosis not present

## 2018-05-21 DIAGNOSIS — I119 Hypertensive heart disease without heart failure: Secondary | ICD-10-CM | POA: Insufficient documentation

## 2018-05-21 DIAGNOSIS — Z8249 Family history of ischemic heart disease and other diseases of the circulatory system: Secondary | ICD-10-CM | POA: Insufficient documentation

## 2018-05-21 DIAGNOSIS — I252 Old myocardial infarction: Secondary | ICD-10-CM | POA: Insufficient documentation

## 2018-05-21 DIAGNOSIS — E876 Hypokalemia: Secondary | ICD-10-CM | POA: Diagnosis not present

## 2018-05-21 DIAGNOSIS — R0602 Shortness of breath: Secondary | ICD-10-CM | POA: Diagnosis not present

## 2018-05-21 DIAGNOSIS — Z9114 Patient's other noncompliance with medication regimen: Secondary | ICD-10-CM | POA: Diagnosis not present

## 2018-05-21 DIAGNOSIS — J441 Chronic obstructive pulmonary disease with (acute) exacerbation: Secondary | ICD-10-CM | POA: Diagnosis not present

## 2018-05-21 DIAGNOSIS — I10 Essential (primary) hypertension: Secondary | ICD-10-CM | POA: Diagnosis not present

## 2018-05-21 DIAGNOSIS — I739 Peripheral vascular disease, unspecified: Secondary | ICD-10-CM | POA: Diagnosis not present

## 2018-05-21 DIAGNOSIS — R0600 Dyspnea, unspecified: Secondary | ICD-10-CM | POA: Diagnosis not present

## 2018-05-21 DIAGNOSIS — Z8673 Personal history of transient ischemic attack (TIA), and cerebral infarction without residual deficits: Secondary | ICD-10-CM | POA: Insufficient documentation

## 2018-05-21 DIAGNOSIS — I2510 Atherosclerotic heart disease of native coronary artery without angina pectoris: Secondary | ICD-10-CM | POA: Diagnosis not present

## 2018-05-21 DIAGNOSIS — Z87891 Personal history of nicotine dependence: Secondary | ICD-10-CM | POA: Diagnosis not present

## 2018-05-21 DIAGNOSIS — K219 Gastro-esophageal reflux disease without esophagitis: Secondary | ICD-10-CM | POA: Insufficient documentation

## 2018-05-21 DIAGNOSIS — E785 Hyperlipidemia, unspecified: Secondary | ICD-10-CM | POA: Diagnosis not present

## 2018-05-21 DIAGNOSIS — R0689 Other abnormalities of breathing: Secondary | ICD-10-CM | POA: Diagnosis not present

## 2018-05-21 LAB — BASIC METABOLIC PANEL
Anion gap: 18 — ABNORMAL HIGH (ref 5–15)
BUN: 9 mg/dL (ref 8–23)
CO2: 23 mmol/L (ref 22–32)
Calcium: 8.8 mg/dL — ABNORMAL LOW (ref 8.9–10.3)
Chloride: 99 mmol/L (ref 98–111)
Creatinine, Ser: 0.96 mg/dL (ref 0.61–1.24)
GFR calc Af Amer: >60 mL/min (ref >60)
GFR calc non Af Amer: >60 mL/min (ref >60)
Glucose, Bld: 192 mg/dL — ABNORMAL HIGH (ref 70–99)
Potassium: 3.3 mmol/L — ABNORMAL LOW (ref 3.5–5.1)
Sodium: 140 mmol/L (ref 135–145)

## 2018-05-21 LAB — CBC
HCT: 52.7 % — ABNORMAL HIGH (ref 39.0–52.0)
Hemoglobin: 16.8 g/dL (ref 13.0–17.0)
MCH: 33.5 pg (ref 26.0–34.0)
MCHC: 31.9 g/dL (ref 30.0–36.0)
MCV: 105.2 fL — ABNORMAL HIGH (ref 80.0–100.0)
Platelets: 201 10*3/uL (ref 150–400)
RBC: 5.01 MIL/uL (ref 4.22–5.81)
RDW: 11.9 % (ref 11.5–15.5)
WBC: 10 10*3/uL (ref 4.0–10.5)
nRBC: 0 % (ref 0.0–0.2)

## 2018-05-21 MED ORDER — IPRATROPIUM BROMIDE 0.02 % IN SOLN
0.5000 mg | Freq: Once | RESPIRATORY_TRACT | Status: AC
  Administered 2018-05-21: 0.5 mg via RESPIRATORY_TRACT
  Filled 2018-05-21: qty 2.5

## 2018-05-21 MED ORDER — SODIUM CHLORIDE 0.9 % IV SOLN: INTRAVENOUS | Status: DC

## 2018-05-21 MED ORDER — ALBUTEROL SULFATE (2.5 MG/3ML) 0.083% IN NEBU
5.0000 mg | INHALATION_SOLUTION | Freq: Once | RESPIRATORY_TRACT | Status: AC
  Administered 2018-05-21: 5 mg via RESPIRATORY_TRACT
  Filled 2018-05-21: qty 6

## 2018-05-21 MED ORDER — ENOXAPARIN SODIUM 40 MG/0.4ML ~~LOC~~ SOLN
40.0000 mg | SUBCUTANEOUS | Status: DC
  Administered 2018-05-22 – 2018-05-23 (×2): 40 mg via SUBCUTANEOUS
  Filled 2018-05-21 (×3): qty 0.4

## 2018-05-21 MED ORDER — ACETAMINOPHEN 650 MG RE SUPP: 650.0000 mg | Freq: Four times a day (QID) | RECTAL | Status: DC | PRN

## 2018-05-21 MED ORDER — ACETAMINOPHEN 325 MG PO TABS: 650.0000 mg | ORAL_TABLET | Freq: Four times a day (QID) | ORAL | Status: DC | PRN

## 2018-05-21 MED ORDER — PREDNISONE 20 MG PO TABS
40.0000 mg | ORAL_TABLET | Freq: Every day | ORAL | Status: DC
  Administered 2018-05-22: 40 mg via ORAL
  Filled 2018-05-21: qty 2

## 2018-05-21 MED ORDER — ALBUTEROL SULFATE (2.5 MG/3ML) 0.083% IN NEBU: 2.5000 mg | INHALATION_SOLUTION | RESPIRATORY_TRACT | Status: DC | PRN

## 2018-05-21 MED ORDER — ASPIRIN EC 81 MG PO TBEC
81.0000 mg | DELAYED_RELEASE_TABLET | Freq: Every day | ORAL | Status: DC
  Administered 2018-05-21 – 2018-05-23 (×3): 81 mg via ORAL
  Filled 2018-05-21 (×3): qty 1

## 2018-05-21 MED ORDER — AZITHROMYCIN 250 MG PO TABS
250.0000 mg | ORAL_TABLET | Freq: Every day | ORAL | Status: DC
  Administered 2018-05-22 – 2018-05-23 (×2): 250 mg via ORAL
  Filled 2018-05-21 (×2): qty 1

## 2018-05-21 MED ORDER — AZITHROMYCIN 500 MG PO TABS
500.0000 mg | ORAL_TABLET | Freq: Every day | ORAL | Status: AC
  Administered 2018-05-21: 500 mg via ORAL
  Filled 2018-05-21: qty 1

## 2018-05-21 MED ORDER — GABAPENTIN 300 MG PO CAPS
300.0000 mg | ORAL_CAPSULE | Freq: Two times a day (BID) | ORAL | Status: DC
  Administered 2018-05-21 – 2018-05-23 (×4): 300 mg via ORAL
  Filled 2018-05-21 (×4): qty 1

## 2018-05-21 MED ORDER — GUAIFENESIN ER 600 MG PO TB12
1200.0000 mg | ORAL_TABLET | Freq: Two times a day (BID) | ORAL | Status: DC
  Administered 2018-05-21 – 2018-05-23 (×4): 1200 mg via ORAL
  Filled 2018-05-21 (×4): qty 2

## 2018-05-21 MED ORDER — POTASSIUM CHLORIDE CRYS ER 20 MEQ PO TBCR
40.0000 meq | EXTENDED_RELEASE_TABLET | Freq: Once | ORAL | Status: AC
  Administered 2018-05-21: 40 meq via ORAL
  Filled 2018-05-21: qty 2

## 2018-05-21 MED ORDER — ROSUVASTATIN CALCIUM 20 MG PO TABS
40.0000 mg | ORAL_TABLET | Freq: Every day | ORAL | Status: DC
  Administered 2018-05-21 – 2018-05-23 (×3): 40 mg via ORAL
  Filled 2018-05-21 (×3): qty 2

## 2018-05-21 NOTE — ED Provider Notes (Signed)
[place] EMERGENCY DEPARTMENT Provider Note   CSN: [id number] Arrival date & time: 05/21/18  1610     History   Chief Complaint No chief complaint on file.   HPI Chris Payne is a 68 y.o. male.  Patient with hx copd, c/o increased sob in the past few days. Symptoms gradual onset with increased cough, increased wheezing. Symptoms progressively worse, severe sob and wheezing today. Mild relief w tx by ems. Ems gave neb x 2, solumedrol iv, and mg. +smoker. No chest pain. Denies leg pain or swelling. No fever or chills.   The history is provided by the patient and the EMS personnel.    Past Medical History:  Diagnosis Date  . AAA (abdominal aortic aneurysm) ([place])    s/p repair 6/11  . CAD (coronary artery disease)    s/p CABG 2/12:  LIMA to LAD, SVG to diagonal-1, SVG to ramus intermedius,, SVG to AM (Dr. Roxan Hockey) ;  b.  Myoview 10/13:  inf infarct with very mild peri-infarct ischemia, EF 49%, inf HK; c  He has severe three-vessel native coronary artery disease. Catheterization March 2015 as above  . CAP (community acquired pneumonia) 02/23/2014  . Carotid stenosis    a.  dopplers 9/92: LICA 42-68%;  b. Carotid dopplers 3/14:  R 0-39%, L 60-79%, repeat 6 mos  . COPD (chronic obstructive pulmonary disease) ([place])   . GERD (gastroesophageal reflux disease)   . Headache(784.0)   . Hyperlipidemia   . Hypertension   . Hypoxia 02/23/2014  . Inguinal hernia   . Myocardial infarction [facility])    PCI x3  . PAD (peripheral artery disease) ([place])    ABIs 4/12:  R 0.88, L 0.92; R SFA 40%, L CFA 50%, L SFA 50-60%  . Pneumonia    hx  . Stroke [facility])    h/o pontine CVA  . Thyroid nodule    incidental finding on carotid doppler 3/14 => thyroid U/S ordered    Patient Active Problem List   Diagnosis Date Noted  . Chronic pain syndrome 07/19/2015  . Degenerative disc disease, lumbar 07/19/2015  . Weakness 04/17/2015  . Generalized weakness 04/17/2015  .  Polycythemia 04/17/2015  . BPH (benign prostatic hyperplasia) 04/17/2015  . Seizure disorder ([place]) 10/12/2014  . TIA (transient ischemic attack) 08/23/2013  . Other and unspecified angina pectoris 07/04/2013  . Nontoxic multinodular goiter 10/07/2012  . Unstable angina ([place]) 06/19/2012  . Carotid stenosis   . PAD (peripheral artery disease) ([place])   . Left inguinal hernia 05/30/2012  . COPD (chronic obstructive pulmonary disease) ([place]) 08/24/2011  . S/P AAA repair 08/24/2011  . Lumbar disc disease   . Hypertensive heart disease   . Hyperlipidemia   . CAD (coronary artery disease)   . History of pontine CVA   . GERD (gastroesophageal reflux disease)     Past Surgical History:  Procedure Laterality Date  . ABDOMINAL AORTIC ANEURYSM REPAIR  10-03-2009  . CORONARY ARTERY BYPASS GRAFT  06/24/2010   LIMA to the LAD, SVG to first diagonal, SVG to ramus intermediate, SVG to acute marginal. EF 50%. 2/12  . EXPLORATION POST OPERATIVE OPEN HEART    . EXPLORATORY LAPAROTOMY  09/2009   ligation of lumbar arteries  . EXTERNAL EAR SURGERY Left    laceration child  . HIP SURGERY  40 years ago   left hip bone removal and pinning  . INGUINAL HERNIA REPAIR Left 07/21/2012   Procedure: HERNIA REPAIR INGUINAL ADULT;  Surgeon: Sammuel Hines  Daiva Nakayama, MD;  Location: [place];  Service: General;  Laterality: Left;  . INSERTION OF MESH Left 07/21/2012   Procedure: INSERTION OF MESH;  Surgeon: Merrie Roof, MD;  Location: [place];  Service: General;  Laterality: Left;  . LEFT HEART CATHETERIZATION WITH CORONARY/GRAFT ANGIOGRAM  06/21/2012   Procedure: LEFT HEART CATHETERIZATION WITH Beatrix Fetters;  Surgeon: Minus Breeding, MD;  Location: [facility] CATH LAB;  Service: Cardiovascular;;  . LEFT HEART CATHETERIZATION WITH CORONARY/GRAFT ANGIOGRAM N/A 07/04/2013   Procedure: LEFT HEART CATHETERIZATION WITH Beatrix Fetters;  Surgeon: Sinclair Grooms, MD;  Location: [facility] CATH LAB;  Service: Cardiovascular;   Laterality: N/A;  . NM MYOCAR PERF WALL MOTION  12/02/2010   inferoapical defect is fixed c/w prior infarction/scar, there is minimal borderzone ischemia.        Home Medications    Prior to Admission medications   Medication Sig Start Date End Date Taking? Authorizing Provider  furosemide (LASIX) 20 MG tablet Take 1 tablet (20 mg total) by mouth daily. 12/04/17   Davonna Belling, MD  gabapentin (NEURONTIN) 100 MG capsule Take 1 capsule (100 mg total) by mouth 3 (three) times daily. Patient taking differently: Take 300 mg by mouth 2 (two) times daily.  05/24/15   Wardell Honour, MD  HYDROcodone-acetaminophen [facility]) 10-325 MG tablet Take 1 tablet by mouth 2 (two) times daily as needed for severe pain. Patient not taking: Reported on 05/20/2017 05/24/15   Wardell Honour, MD  ibuprofen (ADVIL,MOTRIN) 100 MG tablet Take 200 mg by mouth every 6 (six) hours as needed for pain or fever.    [provider]  isosorbide mononitrate (IMDUR) 120 MG 24 hr tablet Take 1 tablet (120 mg total) by mouth daily. PLEASE CONTACT OFFICE FOR ADDITIONAL REFILLS Patient not taking: Reported on 05/20/2017 09/16/15   Minus Breeding, MD  levETIRAcetam (KEPPRA) 500 MG tablet TAKE ONE TABLET BY MOUTH TWICE DAILY Patient not taking: Reported on 05/20/2017 09/14/15   Wardell Honour, MD  morphine (MS CONTIN) 30 MG 12 hr tablet Take 1 tablet (30 mg total) by mouth every 12 (twelve) hours. Patient not taking: Reported on 05/20/2017 05/24/15   Wardell Honour, MD  pantoprazole (PROTONIX) 40 MG tablet Take 40 mg by mouth daily.    [provider]  ranolazine (RANEXA) 1000 MG SR tablet Take 1 tablet (1,000 mg total) by mouth 2 (two) times daily. Please schedule appointment for refills. Patient not taking: Reported on 05/20/2017 07/12/15   Minus Breeding, MD  rosuvastatin (CRESTOR) 40 MG tablet Take 1 tablet (40 mg total) by mouth daily. Please schedule appointment for refills. Patient not taking: Reported on  05/20/2017 01/14/16   Minus Breeding, MD    Family History Family History  Problem Relation Age of Onset  . Alcohol abuse Father   . Cancer Sister 66       unknown primary  . Diabetes Mother   . Hypertension Mother   . Cancer Sister 85       kidney cancer    Social History Social History   Tobacco Use  . Smoking status: Former Smoker    Packs/day: 1.00    Years: 30.00    Pack years: 30.00    Types: Cigarettes    Last attempt to quit: 01/25/2010    Years since quitting: 8.3  . Smokeless tobacco: Never Used  Substance Use Topics  . Alcohol use: No    Comment: former drinker - Quit 2011.  . Drug  use: No     Allergies   Patient has no known allergies.   Review of Systems Review of Systems  Constitutional: Negative for fever.  HENT: Negative for sore throat.   Eyes: Negative for redness.  Respiratory: Positive for cough, shortness of breath and wheezing.   Cardiovascular: Negative for chest pain and leg swelling.  Gastrointestinal: Negative for abdominal pain and vomiting.  Genitourinary: Negative for flank pain.  Musculoskeletal: Negative for back pain and neck pain.  Skin: Negative for rash.  Neurological: Negative for headaches.  Hematological: Does not bruise/bleed easily.  Psychiatric/Behavioral: Negative for confusion.     Physical Exam Updated Vital Signs There were no vitals taken for this visit.  Physical Exam Vitals signs and nursing note reviewed.  Constitutional:      General: He is in acute distress.     Appearance: Normal appearance. He is well-developed. He is ill-appearing.  HENT:     Head: Atraumatic.     Nose: Nose normal.     Mouth/Throat:     Mouth: Mucous membranes are moist.     Pharynx: Oropharynx is clear.  Eyes:     General: No scleral icterus.    Conjunctiva/sclera: Conjunctivae normal.     Pupils: Pupils are equal, round, and reactive to light.  Neck:     Musculoskeletal: Normal range of motion and neck supple. No neck  rigidity.     Trachea: No tracheal deviation.  Cardiovascular:     Rate and Rhythm: Regular rhythm. Tachycardia present.     Pulses: Normal pulses.     Heart sounds: Normal heart sounds. No murmur. No friction rub. No gallop.   Pulmonary:     Effort: Respiratory distress present. No accessory muscle usage.     Breath sounds: Wheezing present.  Abdominal:     General: Bowel sounds are normal. There is no distension.     Palpations: Abdomen is soft.     Tenderness: There is no abdominal tenderness. There is no guarding.  Genitourinary:    Comments: No cva tenderness. Musculoskeletal:        General: No swelling.  Skin:    General: Skin is warm and dry.     Findings: No rash.  Neurological:     Mental Status: He is alert.     Comments: Alert, speech clear.   Psychiatric:        Mood and Affect: Mood normal.      ED Treatments / Results  Labs (all labs ordered are listed, but only abnormal results are displayed) Results for orders placed or performed during the hospital encounter of 05/21/18  CBC  Result Value Ref Range   WBC 10.0 4.0 - 10.5 K/uL   RBC 5.01 4.22 - 5.81 MIL/uL   Hemoglobin 16.8 13.0 - 17.0 g/dL   HCT 52.7 (H) 39.0 - 52.0 %   MCV 105.2 (H) 80.0 - 100.0 fL   MCH 33.5 26.0 - 34.0 pg   MCHC 31.9 30.0 - 36.0 g/dL   RDW 11.9 11.5 - 15.5 %   Platelets 201 150 - 400 K/uL   nRBC 0.0 0.0 - 0.2 %  Basic metabolic panel  Result Value Ref Range   Sodium 140 135 - 145 mmol/L   Potassium 3.3 (L) 3.5 - 5.1 mmol/L   Chloride 99 98 - 111 mmol/L   CO2 23 22 - 32 mmol/L   Glucose, Bld 192 (H) 70 - 99 mg/dL   BUN 9 8 - 23 mg/dL  Creatinine, Ser 0.96 0.61 - 1.24 mg/dL   Calcium 8.8 (L) 8.9 - 10.3 mg/dL   GFR calc non Af Amer >60 >60 mL/min   GFR calc Af Amer >60 >60 mL/min   Anion gap 18 (H) 5 - 15   Dg Chest 2 View  Result Date: 04/25/2018 CLINICAL DATA:  Shortness of breath, painful groin mass for 3 months EXAM: CHEST - 2 VIEW COMPARISON:  09/26/2017 FINDINGS:  There is mild bilateral interstitial prominence. There is no focal consolidation. There is no pleural effusion or pneumothorax. The heart and mediastinal contours are unremarkable. The osseous structures are unremarkable. IMPRESSION: 1. Mild pulmonary vascular congestion. Electronically Signed   By: Kathreen Devoid   On: 04/25/2018 14:04   Dg Chest Port 1 View  Result Date: 05/21/2018 CLINICAL DATA:  Shortness of Breath EXAM: PORTABLE CHEST 1 VIEW COMPARISON:  04/25/2018 FINDINGS: Cardiac shadow is within normal limits. Postsurgical changes are again noted. Aortic calcifications are again seen. The lungs are well aerated bilaterally without focal infiltrate or sizable effusion. No acute bony abnormality is noted. IMPRESSION: No active disease. Electronically Signed   By: Inez Catalina M.D.   On: 05/21/2018 17:12    EKG EKG Interpretation  Date/Time:  Saturday May 21 2018 16:52:58 EST Ventricular Rate:  119 PR Interval:    QRS Duration: 105 QT Interval:  341 QTC Calculation: 480 R Axis:   -37 Text Interpretation:  Sinus tachycardia Confirmed by Lajean Saver [phone]) on 05/21/2018 4:55:49 PM   Radiology Dg Chest Port 1 View  Result Date: 05/21/2018 CLINICAL DATA:  Shortness of Breath EXAM: PORTABLE CHEST 1 VIEW COMPARISON:  04/25/2018 FINDINGS: Cardiac shadow is within normal limits. Postsurgical changes are again noted. Aortic calcifications are again seen. The lungs are well aerated bilaterally without focal infiltrate or sizable effusion. No acute bony abnormality is noted. IMPRESSION: No active disease. Electronically Signed   By: Inez Catalina M.D.   On: 05/21/2018 17:12    Procedures Procedures (including critical care time)  Medications Ordered in ED Medications  0.9 %  sodium chloride infusion (has no administration in time range)  albuterol (PROVENTIL) (2.5 MG/3ML) 0.083% nebulizer solution 5 mg (has no administration in time range)  ipratropium (ATROVENT) nebulizer solution 0.5  mg (has no administration in time range)     Initial Impression / Assessment and Plan / ED Course  I have reviewed the triage vital signs and the nursing notes.  Pertinent labs & imaging results that were available during my care of the patient were reviewed by me and considered in my medical decision making (see chart for details).  Iv ns. Continuous pulse ox and monitor. Ecg. Stat labs. Stat pcxr.  Albuterol and atrovent neb. Pt recd mg iv, solumedrol iv.   Reviewed nursing notes and prior charts for additional history.   pts air exchange improved, and wheezing/level of dyspnea improved, but persistent wheezing/sob.   Labs reviewed - k sl low. kcl po.  cxr reviewed - no pna.   Medicine/Hospitalist service consulted for admission.    Final Clinical Impressions(s) / ED Diagnoses   Final diagnoses:  None    ED Discharge Orders    None       Lajean Saver, MD 05/21/18 1821

## 2018-05-21 NOTE — ED Notes (Signed)
ekg delayed due to the pt had vicks sauve all over huis chest and the electrodes would not stick   chestb washed with nsoap and water   Benzoin applied was able to get ekg finally

## 2018-05-21 NOTE — H&P (Signed)
History and Physical    FRASER BUSCHE DUK:[id number] DOB: [birth year] DOA: 05/21/2018  PCP: Benito Mccreedy, MD  Patient coming from: Home via EMS  I have personally briefly reviewed patient's old medical records in [place]  Chief Complaint: Dyspnea  HPI: ANSHUL MEDDINGS is a 68 y.o. male with medical history significant for COPD, CAD s/p CABG, HTN, HLD, Hx CVA, PAD, AAA s/p repair, and tobacco use who presents to the ED with 3 days of progressive dyspnea, wheezing, and cough productive of white sputum.  He reports associated chest congestion and abdominal wall pain when coughing.  He had no improvement with his rescue inhalers and therefore called EMS.  He denies any fevers, diaphoresis, chest pain, nausea, vomiting, diarrhea, or hemoptysis.  He was given Solu-Medrol 125 mg, albuterol and Atrovent inhalers, and magnesium 2 g by EMS on route to the ED. Patient states he is not taking any of his medication except for gabapentin  ED Course:  Initial vitals showed BP 143/94, pulse 128, RR 22, temp 98.9 Fahrenheit, SPO2 100% on room air.  Labs are notable for potassium 3.3.  Portable chest x-ray showed prior sternotomy changes without focal consolidation, effusion, or edema.  Patient was given albuterol and Atrovent nebulizers in the ED and 40 mEq potassium supplement.  The hospital service was consulted admit for further management.  Review of Systems: As per HPI otherwise 10 point review of systems negative.    Past Medical History:  Diagnosis Date  . AAA (abdominal aortic aneurysm) ([place])    s/p repair 6/11  . CAD (coronary artery disease)    s/p CABG 2/12:  LIMA to LAD, SVG to diagonal-1, SVG to ramus intermedius,, SVG to AM (Dr. Roxan Hockey) ;  b.  Myoview 10/13:  inf infarct with very mild peri-infarct ischemia, EF 49%, inf HK; c  He has severe three-vessel native coronary artery disease. Catheterization March 2015 as above  . CAP (community acquired pneumonia) 02/23/2014    . Carotid stenosis    a.  dopplers 3/76: LICA 28-31%;  b. Carotid dopplers 3/14:  R 0-39%, L 60-79%, repeat 6 mos  . COPD (chronic obstructive pulmonary disease) ([place])   . GERD (gastroesophageal reflux disease)   . Headache(784.0)   . Hyperlipidemia   . Hypertension   . Hypoxia 02/23/2014  . Inguinal hernia   . Myocardial infarction [facility])    PCI x3  . PAD (peripheral artery disease) ([place])    ABIs 4/12:  R 0.88, L 0.92; R SFA 40%, L CFA 50%, L SFA 50-60%  . Pneumonia    hx  . Stroke [facility])    h/o pontine CVA  . Thyroid nodule    incidental finding on carotid doppler 3/14 => thyroid U/S ordered    Past Surgical History:  Procedure Laterality Date  . ABDOMINAL AORTIC ANEURYSM REPAIR  10-03-2009  . CORONARY ARTERY BYPASS GRAFT  06/24/2010   LIMA to the LAD, SVG to first diagonal, SVG to ramus intermediate, SVG to acute marginal. EF 50%. 2/12  . EXPLORATION POST OPERATIVE OPEN HEART    . EXPLORATORY LAPAROTOMY  09/2009   ligation of lumbar arteries  . EXTERNAL EAR SURGERY Left    laceration child  . HIP SURGERY  40 years ago   left hip bone removal and pinning  . INGUINAL HERNIA REPAIR Left 07/21/2012   Procedure: HERNIA REPAIR INGUINAL ADULT;  Surgeon: Merrie Roof, MD;  Location: [place];  Service: General;  Laterality: Left;  .  INSERTION OF MESH Left 07/21/2012   Procedure: INSERTION OF MESH;  Surgeon: Merrie Roof, MD;  Location: [place];  Service: General;  Laterality: Left;  . LEFT HEART CATHETERIZATION WITH CORONARY/GRAFT ANGIOGRAM  06/21/2012   Procedure: LEFT HEART CATHETERIZATION WITH Beatrix Fetters;  Surgeon: Minus Breeding, MD;  Location: [facility] CATH LAB;  Service: Cardiovascular;;  . LEFT HEART CATHETERIZATION WITH CORONARY/GRAFT ANGIOGRAM N/A 07/04/2013   Procedure: LEFT HEART CATHETERIZATION WITH Beatrix Fetters;  Surgeon: Sinclair Grooms, MD;  Location: [facility] CATH LAB;  Service: Cardiovascular;  Laterality: N/A;  . NM MYOCAR PERF WALL MOTION  12/02/2010    inferoapical defect is fixed c/w prior infarction/scar, there is minimal borderzone ischemia.     reports that he quit smoking about 8 years ago. His smoking use included cigarettes. He has a 30.00 pack-year smoking history. He has never used smokeless tobacco. He reports that he does not drink alcohol or use drugs.  No Known Allergies  Family History  Problem Relation Age of Onset  . Alcohol abuse Father   . Cancer Sister 80       unknown primary  . Diabetes Mother   . Hypertension Mother   . Cancer Sister 7       kidney cancer     Prior to Admission medications   Medication Sig Start Date End Date Taking? Authorizing Provider  gabapentin (NEURONTIN) 300 MG capsule Take 300 mg by mouth 2 (two) times daily. 05/14/18  Yes [provider]  furosemide (LASIX) 20 MG tablet Take 1 tablet (20 mg total) by mouth daily. Patient not taking: Reported on 05/21/2018 12/04/17   Davonna Belling, MD  gabapentin (NEURONTIN) 100 MG capsule Take 1 capsule (100 mg total) by mouth 3 (three) times daily. Patient not taking: Reported on 05/21/2018 05/24/15   Wardell Honour, MD  HYDROcodone-acetaminophen [facility]) 10-325 MG tablet Take 1 tablet by mouth 2 (two) times daily as needed for severe pain. Patient not taking: Reported on 05/20/2017 05/24/15   Wardell Honour, MD  isosorbide mononitrate (IMDUR) 120 MG 24 hr tablet Take 1 tablet (120 mg total) by mouth daily. PLEASE CONTACT OFFICE FOR ADDITIONAL REFILLS Patient not taking: Reported on 05/20/2017 09/16/15   Minus Breeding, MD  levETIRAcetam (KEPPRA) 500 MG tablet TAKE ONE TABLET BY MOUTH TWICE DAILY Patient not taking: Reported on 05/20/2017 09/14/15   Wardell Honour, MD  morphine (MS CONTIN) 30 MG 12 hr tablet Take 1 tablet (30 mg total) by mouth every 12 (twelve) hours. Patient not taking: Reported on 05/20/2017 05/24/15   Wardell Honour, MD  ranolazine (RANEXA) 1000 MG SR tablet Take 1 tablet (1,000 mg total) by mouth 2 (two) times daily.  Please schedule appointment for refills. Patient not taking: Reported on 05/20/2017 07/12/15   Minus Breeding, MD  rosuvastatin (CRESTOR) 40 MG tablet Take 1 tablet (40 mg total) by mouth daily. Please schedule appointment for refills. Patient not taking: Reported on 05/20/2017 01/14/16   Minus Breeding, MD    Physical Exam: Vitals:   05/21/18 1630 05/21/18 1700 05/21/18 1730 05/21/18 1800  BP: 117/70 (!) 143/94 137/77 130/73  Pulse: (!) 119 (!) 125 (!) 127 (!) 119  Resp:  (!) 22 (!) 24 (!) 30  Temp:      TempSrc:      SpO2: 92% 100% 92% 91%  Weight:      Height:        Constitutional: NAD, calm, comfortable, sitting up in bed Eyes: PERRL, lids  and conjunctivae normal ENMT: Mucous membranes are dry. Posterior pharynx clear of any exudate or lesions. Normal dentition.  Neck: normal, supple, no masses. Respiratory: Expiratory wheezing bilaterally.  Normal respiratory effort. No accessory muscle use.  Cardiovascular: Regular rate and rhythm, no murmurs / rubs / gallops. No extremity edema. Abdomen: no tenderness, no masses palpated. No hepatosplenomegaly. Bowel sounds positive.  Musculoskeletal: no clubbing / cyanosis. No joint deformity upper and lower extremities. Good ROM, no contractures. Normal muscle tone.  Skin: Senile purpura Neurologic: CN 2-12 grossly intact. Sensation intact, Strength 5/5 in all 4.  Psychiatric: Normal judgment and insight. Alert and oriented x 3. Normal mood.     Labs on Admission: I have personally reviewed following labs and imaging studies  CBC: Recent Labs  Lab 05/21/18 1653  WBC 10.0  HGB 16.8  HCT 52.7*  MCV 105.2*  PLT 790   Basic Metabolic Panel: Recent Labs  Lab 05/21/18 1653  NA 140  K 3.3*  CL 99  CO2 23  GLUCOSE 192*  BUN 9  CREATININE 0.96  CALCIUM 8.8*   GFR: Estimated Creatinine Clearance: 67.9 mL/min (by C-G formula based on SCr of 0.96 mg/dL). Liver Function Tests: No results for input(s): AST, ALT, ALKPHOS,  BILITOT, PROT, ALBUMIN in the last 168 hours. No results for input(s): LIPASE, AMYLASE in the last 168 hours. No results for input(s): AMMONIA in the last 168 hours. Coagulation Profile: No results for input(s): INR, PROTIME in the last 168 hours. Cardiac Enzymes: No results for input(s): CKTOTAL, CKMB, CKMBINDEX, TROPONINI in the last 168 hours. BNP (last 3 results) No results for input(s): PROBNP in the last 8760 hours. HbA1C: No results for input(s): HGBA1C in the last 72 hours. CBG: No results for input(s): GLUCAP in the last 168 hours. Lipid Profile: No results for input(s): CHOL, HDL, LDLCALC, TRIG, CHOLHDL, LDLDIRECT in the last 72 hours. Thyroid Function Tests: No results for input(s): TSH, T4TOTAL, FREET4, T3FREE, THYROIDAB in the last 72 hours. Anemia Panel: No results for input(s): VITAMINB12, FOLATE, FERRITIN, TIBC, IRON, RETICCTPCT in the last 72 hours. Urine analysis:    Component Value Date/Time   COLORURINE YELLOW 04/25/2018 [place] 04/25/2018 1527   LABSPEC 1.008 04/25/2018 1527   PHURINE 7.0 04/25/2018 1527   GLUCOSEU NEGATIVE 04/25/2018 1527   HGBUR NEGATIVE 04/25/2018 [place] 04/25/2018 1527   BILIRUBINUR small 08/23/2013 1736   KETONESUR NEGATIVE 04/25/2018 1527   PROTEINUR NEGATIVE 04/25/2018 1527   UROBILINOGEN 1.0 02/08/2015 1902   NITRITE NEGATIVE 04/25/2018 1527   LEUKOCYTESUR NEGATIVE 04/25/2018 1527    Radiological Exams on Admission: Dg Chest Port 1 View  Result Date: 05/21/2018 CLINICAL DATA:  Shortness of Breath EXAM: PORTABLE CHEST 1 VIEW COMPARISON:  04/25/2018 FINDINGS: Cardiac shadow is within normal limits. Postsurgical changes are again noted. Aortic calcifications are again seen. The lungs are well aerated bilaterally without focal infiltrate or sizable effusion. No acute bony abnormality is noted. IMPRESSION: No active disease. Electronically Signed   By: Inez Catalina M.D.   On: 05/21/2018 17:12     EKG: Independently reviewed. Sinus tachycardia  Assessment/Plan Principal Problem:   COPD exacerbation (HCC) Active Problems:   Hyperlipidemia   CAD (coronary artery disease)  CRIAG WICKLUND is a 68 y.o. male with medical history significant for COPD, CAD s/p CABG, HTN, HLD, Hx CVA, PAD, AAA s/p repair, and tobacco use who is admitted with COPD exacerbation.  Acute on chronic COPD exacerbation: Currently saturating well on  room air but with continued diffuse wheezing. -DuoNeb and PRN albuterol nebs -Start oral prednisone -Oral azithromycin for increased dyspnea and productive cough  CAD status post CABG: Patient denies any chest pain.  He has not been taking aspirin. -Restart aspirin  Hyperlipidemia: Restart home rosuvastatin.  Hypokalemia: 3.3 on admission, received supplement in ED. -Recheck labs in a.m.   DVT prophylaxis: Lovenox Code Status: Full code Family Communication: None present at bedside admission Disposition Plan: Anticipate discharge to home in 1-2 days Consults called: None Admission status: Observation   Zada Finders MD [facility] Pager [phone]  If 7PM-7AM, please contact night-coverage [email or web address]  05/21/2018, 8:02 PM

## 2018-05-21 NOTE — ED Notes (Signed)
Report attempted  To 3e  rn will call me back

## 2018-05-21 NOTE — ED Notes (Signed)
Pt given a bed on 5c  Message left to call me for rep;ort  After 20 minutes of no call icalled back  Apparently they on 5rc  Got the bed assigned to anotgher floor  They did notg call me to tell me  That they had reassigned the bed

## 2018-05-21 NOTE — ED Notes (Signed)
I attempted report they will call back

## 2018-05-21 NOTE — ED Notes (Signed)
Report given to rn fred on 3e

## 2018-05-21 NOTE — ED Triage Notes (Signed)
The pt arrived by [facility] for one week hx copd productive cough yellow ic per ems  Pt given solu-medrol 125mg  iv albuterol 10 mg and atrovent 0.5 mg   Mag sulfate 2 gms  At present respirations  Better  Wheezing less audible

## 2018-05-22 ENCOUNTER — Other Ambulatory Visit: Payer: Self-pay

## 2018-05-22 DIAGNOSIS — E785 Hyperlipidemia, unspecified: Secondary | ICD-10-CM | POA: Diagnosis not present

## 2018-05-22 DIAGNOSIS — J441 Chronic obstructive pulmonary disease with (acute) exacerbation: Secondary | ICD-10-CM

## 2018-05-22 LAB — CBC
HCT: 47.2 % (ref 39.0–52.0)
Hemoglobin: 15.1 g/dL (ref 13.0–17.0)
MCH: 33 pg (ref 26.0–34.0)
MCHC: 32 g/dL (ref 30.0–36.0)
MCV: 103.3 fL — ABNORMAL HIGH (ref 80.0–100.0)
Platelets: 213 10*3/uL (ref 150–400)
RBC: 4.57 MIL/uL (ref 4.22–5.81)
RDW: 12.1 % (ref 11.5–15.5)
WBC: 6.2 10*3/uL (ref 4.0–10.5)
nRBC: 0 % (ref 0.0–0.2)

## 2018-05-22 LAB — BASIC METABOLIC PANEL
Anion gap: 9 (ref 5–15)
BUN: 10 mg/dL (ref 8–23)
CO2: 25 mmol/L (ref 22–32)
Calcium: 8.9 mg/dL (ref 8.9–10.3)
Chloride: 104 mmol/L (ref 98–111)
Creatinine, Ser: 0.86 mg/dL (ref 0.61–1.24)
GFR calc Af Amer: >60 mL/min (ref >60)
GFR calc non Af Amer: >60 mL/min (ref >60)
Glucose, Bld: 209 mg/dL — ABNORMAL HIGH (ref 70–99)
POTASSIUM: 4.9 mmol/L (ref 3.5–5.1)
Sodium: 138 mmol/L (ref 135–145)

## 2018-05-22 LAB — HIV ANTIBODY (ROUTINE TESTING W REFLEX): HIV Screen 4th Generation wRfx: NONREACTIVE

## 2018-05-22 MED ORDER — IPRATROPIUM-ALBUTEROL 0.5-2.5 (3) MG/3ML IN SOLN
3.0000 mL | Freq: Four times a day (QID) | RESPIRATORY_TRACT | Status: DC
  Administered 2018-05-22 – 2018-05-23 (×5): 3 mL via RESPIRATORY_TRACT
  Filled 2018-05-22 (×4): qty 3

## 2018-05-22 MED ORDER — METHYLPREDNISOLONE SODIUM SUCC 40 MG IJ SOLR
40.0000 mg | Freq: Two times a day (BID) | INTRAMUSCULAR | Status: DC
  Administered 2018-05-22 – 2018-05-23 (×3): 40 mg via INTRAVENOUS
  Filled 2018-05-22 (×3): qty 1

## 2018-05-22 MED ORDER — IPRATROPIUM-ALBUTEROL 0.5-2.5 (3) MG/3ML IN SOLN: 3.0000 mL | Freq: Four times a day (QID) | RESPIRATORY_TRACT | Status: DC

## 2018-05-22 NOTE — Progress Notes (Signed)
Patient arrived to 3e24 from [facility]. No complaints of pain. VSS. ST on telemetry. No skin breakdown. Gen plan of care initiated. Moderate fall risk. Patient oriented to call system and room.

## 2018-05-22 NOTE — Progress Notes (Signed)
CCMD called about patient being on cardiac monitoring with no MD orderby night shift. MD Lucianne Lei paged and she reported that patient does not need telemetry. CCMD was called to discontinue patient cardiac monitoring.

## 2018-05-22 NOTE — Progress Notes (Signed)
Progress Note    Chris Payne  EXB:[id number] DOB: Jul 15, 1950  DOA: 05/21/2018 PCP: Benito Mccreedy, MD    Brief Narrative:     Medical records reviewed and are as summarized below:  Chris Payne is an 68 y.o. male with medical history significant for COPD, CAD s/p CABG, HTN, HLD, Hx CVA, PAD, AAA s/p repair, and tobacco use who is admitted with COPD exacerbation.  Assessment/Plan:   Principal Problem:   COPD exacerbation (HCC) Active Problems:   Hyperlipidemia   CAD (coronary artery disease)  Acute COPD exacerbation: -not requiring O2 at rest -will get home O2 eval in AM -DuoNeb and PRN albuterol nebs -Oral azithromycin -IV steroids -chart review shows that in 2016 a CT scan showed Emphysematous changes and fibrosis in the lungs -denies PFTs  CAD status post CABG: Patient denies any chest pain.  He has not been taking aspirin. -Restart aspirin  Hyperlipidemia: Restart home rosuvastatin.  Hypokalemia: -repleted and resolved  Medication non-compliance -appears to not be taking any of his medications at home and is not on any inhalers     Family Communication/Anticipated D/C date and plan/Code Status   DVT prophylaxis: Lovenox ordered. Code Status: Full Code.  Family Communication: none Disposition Plan: have changed back to IV Steroids as continue to wheeze   Medical Consultants:    None.     Subjective:   Coughing up thick mucus Denies ever having PFTS- has not smoked in 9 years  Objective:    Vitals:   05/22/18 0024 05/22/18 0539 05/22/18 0732 05/22/18 1414  BP: (!) 126/92 (!) 142/82 (!) 142/79   Pulse: (!) 107 88 88 92  Resp: 18 18 18 20   Temp: 98.5 F (36.9 C) 98.5 F (36.9 C) 98.4 F (36.9 C)   TempSrc: Oral Oral Oral   SpO2: 91% 92% 90% 96%  Weight:  76.7 kg    Height:        Intake/Output Summary (Last 24 hours) at 05/22/2018 1427 Last data filed at 05/22/2018 1257 Gross per 24 hour  Intake 480 ml  Output  1100 ml  Net -620 ml   Filed Weights   05/21/18 1629 05/22/18 0539  Weight: 77.1 kg 76.7 kg    Exam: Chronically ill appearing Expiratory wheezing but otherwise not moving much air No LE edema +BS, soft, NT  Data Reviewed:   I have personally reviewed following labs and imaging studies:  Labs: Labs show the following:   Basic Metabolic Panel: Recent Labs  Lab 05/21/18 1653 05/22/18 0543  NA 140 138  K 3.3* 4.9  CL 99 104  CO2 23 25  GLUCOSE 192* 209*  BUN 9 10  CREATININE 0.96 0.86  CALCIUM 8.8* 8.9   GFR Estimated Creatinine Clearance: 75.8 mL/min (by C-G formula based on SCr of 0.86 mg/dL). Liver Function Tests: No results for input(s): AST, ALT, ALKPHOS, BILITOT, PROT, ALBUMIN in the last 168 hours. No results for input(s): LIPASE, AMYLASE in the last 168 hours. No results for input(s): AMMONIA in the last 168 hours. Coagulation profile No results for input(s): INR, PROTIME in the last 168 hours.  CBC: Recent Labs  Lab 05/21/18 1653 05/22/18 0543  WBC 10.0 6.2  HGB 16.8 15.1  HCT 52.7* 47.2  MCV 105.2* 103.3*  PLT 201 213   Cardiac Enzymes: No results for input(s): CKTOTAL, CKMB, CKMBINDEX, TROPONINI in the last 168 hours. BNP (last 3 results) No results for input(s): PROBNP in the last 8760 hours. CBG: No  results for input(s): GLUCAP in the last 168 hours. D-Dimer: No results for input(s): DDIMER in the last 72 hours. Hgb A1c: No results for input(s): HGBA1C in the last 72 hours. Lipid Profile: No results for input(s): CHOL, HDL, LDLCALC, TRIG, CHOLHDL, LDLDIRECT in the last 72 hours. Thyroid function studies: No results for input(s): TSH, T4TOTAL, T3FREE, THYROIDAB in the last 72 hours.  Invalid input(s): FREET3 Anemia work up: No results for input(s): VITAMINB12, FOLATE, FERRITIN, TIBC, IRON, RETICCTPCT in the last 72 hours. Sepsis Labs: Recent Labs  Lab 05/21/18 1653 05/22/18 0543  WBC 10.0 6.2    Microbiology No results found  for this or any previous visit (from the past 240 hour(s)).  Procedures and diagnostic studies:  Dg Chest Port 1 View  Result Date: 05/21/2018 CLINICAL DATA:  Shortness of Breath EXAM: PORTABLE CHEST 1 VIEW COMPARISON:  04/25/2018 FINDINGS: Cardiac shadow is within normal limits. Postsurgical changes are again noted. Aortic calcifications are again seen. The lungs are well aerated bilaterally without focal infiltrate or sizable effusion. No acute bony abnormality is noted. IMPRESSION: No active disease. Electronically Signed   By: Inez Catalina M.D.   On: 05/21/2018 17:12    Medications:   . aspirin EC  81 mg Oral Daily  . azithromycin  250 mg Oral Daily  . enoxaparin (LOVENOX) injection  40 mg Subcutaneous Q24H  . gabapentin  300 mg Oral BID  . guaiFENesin  1,200 mg Oral BID  . ipratropium-albuterol  3 mL Nebulization Q6H  . methylPREDNISolone (SOLU-MEDROL) injection  40 mg Intravenous Q12H  . rosuvastatin  40 mg Oral Daily   Continuous Infusions: . sodium chloride       LOS: 0 days   Geradine Girt  [facility]   *Please refer to [email or web address], password [email or web address] to get updated schedule on who will round on this patient, as hospitalists switch teams weekly. If 7PM-7AM, please contact night-coverage at [email or web address], password [email or web address] for any overnight needs.  05/22/2018, 2:27 PM

## 2018-05-23 DIAGNOSIS — J441 Chronic obstructive pulmonary disease with (acute) exacerbation: Secondary | ICD-10-CM | POA: Diagnosis not present

## 2018-05-23 MED ORDER — PREDNISONE 20 MG PO TABS: ORAL_TABLET | ORAL | 0 refills | Status: DC

## 2018-05-23 MED ORDER — ALBUTEROL SULFATE HFA 108 (90 BASE) MCG/ACT IN AERS: 2.0000 | INHALATION_SPRAY | Freq: Four times a day (QID) | RESPIRATORY_TRACT | 0 refills | Status: DC | PRN

## 2018-05-23 MED ORDER — FLUTICASONE-SALMETEROL 250-50 MCG/DOSE IN AEPB: 1.0000 | INHALATION_SPRAY | Freq: Two times a day (BID) | RESPIRATORY_TRACT | 0 refills | Status: DC

## 2018-05-23 MED ORDER — AZITHROMYCIN 250 MG PO TABS: 250.0000 mg | ORAL_TABLET | Freq: Every day | ORAL | 0 refills | Status: DC

## 2018-05-23 MED ORDER — ALUM & MAG HYDROXIDE-SIMETH 200-200-20 MG/5ML PO SUSP
30.0000 mL | Freq: Once | ORAL | Status: AC
  Administered 2018-05-23: 30 mL via ORAL
  Filled 2018-05-23: qty 30

## 2018-05-23 MED ORDER — ASPIRIN 81 MG PO TBEC: 81.0000 mg | DELAYED_RELEASE_TABLET | Freq: Every day | ORAL | Status: DC

## 2018-05-23 NOTE — Progress Notes (Signed)
Patient was discharged home. Family waiting in in the car for patient in the [place] area. Peripheral IV removed, clean dry and intact, pressure and dressing applied. Patient denies chest pain. Patients belongings with patient:  Clothes and shoes.  Discharge instructions with teach back given.  Patients questions and concerns were answered, patient denies any further questions.  Patient was transported in wheelchair by Nurse Tech. CCMD called.

## 2018-05-23 NOTE — Discharge Summary (Signed)
Physician Discharge Summary  Chris Payne [id number] DOB: Aug 28, 1950 DOA: 05/21/2018  PCP: Benito Mccreedy, MD  Admit date: 05/21/2018 Discharge date: 05/23/2018  Admitted From: home Discharge disposition: home   Recommendations for Outpatient Follow-Up:   1. PFTs? 2. Pulmonary rehab? 3. Patient did not know what inhalers he was one, please reconcile at next visit   Discharge Diagnosis:   Principal Problem:   COPD exacerbation ([place]) Active Problems:   Hyperlipidemia   CAD (coronary artery disease)    Discharge Condition: Improved.  Diet recommendation: Low sodium, heart healthy.  Wound care: None.  Code status: Full.   History of Present Illness:   Chris Payne is a 68 y.o. male with medical history significant for COPD, CAD s/p CABG, HTN, HLD, Hx CVA, PAD, AAA s/p repair, and tobacco use who presents to the ED with 3 days of progressive dyspnea, wheezing, and cough productive of white sputum.  He reports associated chest congestion and abdominal wall pain when coughing.  He had no improvement with his rescue inhalers and therefore called EMS.  He denies any fevers, diaphoresis, chest pain, nausea, vomiting, diarrhea, or hemoptysis.  He was given Solu-Medrol 125 mg, albuterol and Atrovent inhalers, and magnesium 2 g by EMS on route to the ED. Patient states he is not taking any of his medication except for gabapentin   Hospital Course by Problem:   Acute COPD exacerbation: -not requiring O2  -inhalers -Oral azithromycin -IV steroids- convert to PO -chart review shows that in 2016 a CT scan showed Emphysematous changes and fibrosis in the lungs -? Needs PFTs  CAD status post CABG: Patient denies any chest pain. He has not been taking aspirin. -Restart aspirin  Hyperlipidemia: Restart home rosuvastatin.  Hypokalemia: -repleted and resolved     Medical Consultants:      Discharge Exam:   Vitals:   05/23/18 0457 05/23/18  0813  BP: (!) 152/84   Pulse: 81   Resp: 18   Temp: 98.3 F (36.8 C)   SpO2: 91% 90%   Vitals:   05/23/18 0101 05/23/18 0307 05/23/18 0457 05/23/18 0813  BP: (!) 150/92  (!) 152/84   Pulse: 83  81   Resp: 18  18   Temp: 98.5 F (36.9 C)  98.3 F (36.8 C)   TempSrc: Oral  Oral   SpO2: 91% (!) 88% 91% 90%  Weight:   76.3 kg   Height:        General exam: Appears calm and comfortable.  Moving more air with less wheezing  The results of significant diagnostics from this hospitalization (including imaging, microbiology, ancillary and laboratory) are listed below for reference.     Procedures and Diagnostic Studies:   Dg Chest Port 1 View  Result Date: 05/21/2018 CLINICAL DATA:  Shortness of Breath EXAM: PORTABLE CHEST 1 VIEW COMPARISON:  04/25/2018 FINDINGS: Cardiac shadow is within normal limits. Postsurgical changes are again noted. Aortic calcifications are again seen. The lungs are well aerated bilaterally without focal infiltrate or sizable effusion. No acute bony abnormality is noted. IMPRESSION: No active disease. Electronically Signed   By: Inez Catalina M.D.   On: 05/21/2018 17:12     Labs:   Basic Metabolic Panel: Recent Labs  Lab 05/21/18 1653 05/22/18 0543  NA 140 138  K 3.3* 4.9  CL 99 104  CO2 23 25  GLUCOSE 192* 209*  BUN 9 10  CREATININE 0.96 0.86  CALCIUM 8.8* 8.9   GFR Estimated  Creatinine Clearance: 75.8 mL/min (by C-G formula based on SCr of 0.86 mg/dL). Liver Function Tests: No results for input(s): AST, ALT, ALKPHOS, BILITOT, PROT, ALBUMIN in the last 168 hours. No results for input(s): LIPASE, AMYLASE in the last 168 hours. No results for input(s): AMMONIA in the last 168 hours. Coagulation profile No results for input(s): INR, PROTIME in the last 168 hours.  CBC: Recent Labs  Lab 05/21/18 1653 05/22/18 0543  WBC 10.0 6.2  HGB 16.8 15.1  HCT 52.7* 47.2  MCV 105.2* 103.3*  PLT 201 213   Cardiac Enzymes: No results for input(s):  CKTOTAL, CKMB, CKMBINDEX, TROPONINI in the last 168 hours. BNP: Invalid input(s): POCBNP CBG: No results for input(s): GLUCAP in the last 168 hours. D-Dimer No results for input(s): DDIMER in the last 72 hours. Hgb A1c No results for input(s): HGBA1C in the last 72 hours. Lipid Profile No results for input(s): CHOL, HDL, LDLCALC, TRIG, CHOLHDL, LDLDIRECT in the last 72 hours. Thyroid function studies No results for input(s): TSH, T4TOTAL, T3FREE, THYROIDAB in the last 72 hours.  Invalid input(s): FREET3 Anemia work up No results for input(s): VITAMINB12, FOLATE, FERRITIN, TIBC, IRON, RETICCTPCT in the last 72 hours. Microbiology No results found for this or any previous visit (from the past 240 hour(s)).   Discharge Instructions:   Discharge Instructions    Diet - low sodium heart healthy   Complete by:  As directed    Discharge instructions   Complete by:  As directed    Outpatient follow up for PFTs   Increase activity slowly   Complete by:  As directed      Allergies as of 05/23/2018   No Known Allergies     Medication List    STOP taking these medications   furosemide 20 MG tablet Commonly known as:  LASIX   HYDROcodone-acetaminophen 10-325 MG tablet Commonly known as:  NORCO   isosorbide mononitrate 120 MG 24 hr tablet Commonly known as:  IMDUR   morphine 30 MG 12 hr tablet Commonly known as:  MS CONTIN   ranolazine 1000 MG SR tablet Commonly known as:  RANEXA     TAKE these medications   albuterol 108 (90 Base) MCG/ACT inhaler Commonly known as:  PROVENTIL HFA;VENTOLIN HFA Inhale 2 puffs into the lungs every 6 (six) hours as needed for wheezing or shortness of breath.   aspirin 81 MG EC tablet Take 1 tablet (81 mg total) by mouth daily. Start taking on:  May 24, 2018   azithromycin 250 MG tablet Commonly known as:  ZITHROMAX Take 1 tablet (250 mg total) by mouth daily.   Fluticasone-Salmeterol 250-50 MCG/DOSE Aepb Commonly known as:   ADVAIR DISKUS Inhale 1 puff into the lungs 2 (two) times daily.   gabapentin 300 MG capsule Commonly known as:  NEURONTIN Take 300 mg by mouth 2 (two) times daily. What changed:  Another medication with the same name was removed. Continue taking this medication, and follow the directions you see here.   levETIRAcetam 500 MG tablet Commonly known as:  KEPPRA TAKE ONE TABLET BY MOUTH TWICE DAILY   predniSONE 20 MG tablet Commonly known as:  DELTASONE 40 mg PO daily x 3 days   rosuvastatin 40 MG tablet Commonly known as:  CRESTOR Take 1 tablet (40 mg total) by mouth daily. Please schedule appointment for refills.      Follow-up Information    Benito Mccreedy, MD Follow up.   Specialty:  Internal Medicine Why:  consider PFTs  if not already done Contact information: [address] [place]  [address] [phone]            Time coordinating discharge: 25 min  Signed:  Geradine Girt DO  [facility] 05/23/2018, 1:16 PM

## 2018-05-23 NOTE — Care Management Obs Status (Signed)
[facility] NOTIFICATION   Patient Details  Name: JONAVAN VANHORN MRN: [id number] Date of Birth: [birth year]   Medicare Observation Status Notification Given:  Yes    Erenest Rasher, RN 05/23/2018, 12:22 PM

## 2018-05-23 NOTE — Progress Notes (Signed)
SATURATION QUALIFICATIONS: (This note is used to comply with regulatory documentation for home oxygen)  Patient Saturations on Room Air at Rest = 94%  Patient Saturations on Room Air while Ambulating = 91%  Patient ambulated 400 ft.

## 2018-06-03 DIAGNOSIS — J441 Chronic obstructive pulmonary disease with (acute) exacerbation: Secondary | ICD-10-CM | POA: Diagnosis not present

## 2018-06-03 DIAGNOSIS — R600 Localized edema: Secondary | ICD-10-CM | POA: Diagnosis not present

## 2018-06-03 DIAGNOSIS — K591 Functional diarrhea: Secondary | ICD-10-CM | POA: Diagnosis not present

## 2018-06-03 DIAGNOSIS — E785 Hyperlipidemia, unspecified: Secondary | ICD-10-CM | POA: Diagnosis not present

## 2018-06-03 DIAGNOSIS — I10 Essential (primary) hypertension: Secondary | ICD-10-CM | POA: Diagnosis not present

## 2018-06-07 DIAGNOSIS — E782 Mixed hyperlipidemia: Secondary | ICD-10-CM | POA: Diagnosis not present

## 2018-06-07 DIAGNOSIS — J449 Chronic obstructive pulmonary disease, unspecified: Secondary | ICD-10-CM | POA: Diagnosis not present

## 2018-06-07 DIAGNOSIS — E1165 Type 2 diabetes mellitus with hyperglycemia: Secondary | ICD-10-CM | POA: Diagnosis not present

## 2018-06-07 DIAGNOSIS — I2510 Atherosclerotic heart disease of native coronary artery without angina pectoris: Secondary | ICD-10-CM | POA: Diagnosis not present

## 2018-06-07 DIAGNOSIS — I10 Essential (primary) hypertension: Secondary | ICD-10-CM | POA: Diagnosis not present

## 2018-06-24 ENCOUNTER — Ambulatory Visit (INDEPENDENT_AMBULATORY_CARE_PROVIDER_SITE_OTHER): Payer: Medicare Other | Admitting: Podiatry

## 2018-06-24 ENCOUNTER — Encounter: Payer: Self-pay | Admitting: Podiatry

## 2018-06-24 DIAGNOSIS — G629 Polyneuropathy, unspecified: Secondary | ICD-10-CM | POA: Diagnosis not present

## 2018-06-24 DIAGNOSIS — M79674 Pain in right toe(s): Secondary | ICD-10-CM | POA: Diagnosis not present

## 2018-06-24 DIAGNOSIS — M79675 Pain in left toe(s): Secondary | ICD-10-CM

## 2018-06-24 DIAGNOSIS — I739 Peripheral vascular disease, unspecified: Secondary | ICD-10-CM | POA: Diagnosis not present

## 2018-06-24 DIAGNOSIS — B351 Tinea unguium: Secondary | ICD-10-CM

## 2018-06-24 NOTE — Progress Notes (Signed)
This patient presents the office with chief complaint of long thick nails patient states nails are painful walking and wearing his shoes.  Patient has severe swelling noted in his legs and his feet.  Patient also has a history of neuropathy for which he takes gabapentin .  He says he was referred to this he was referred to this office by his medical doctorHe says he was referred to this office by his medical doctor.  He presents to the office for treatment of his nail and a foot exam.   General Appearance  Alert, conversant and in no acute stress.  Vascular  Dorsalis pedis and posterior tibial  pulses are not  palpable  Bilaterally due to swelling.    Capillary return is decreased.  bilaterally. Cold feet noted.  Bilaterally. Severe swelling feet/legs due to venous insufficiency.  Neurologic  Senn-Weinstein monofilament wire test within normal limits  Left foot.  LOPS absent right foot.. Muscle power within normal limits bilaterally.  Nails Thick disfigured discolored nails with subungual debris  from hallux to fifth toes bilaterally. No evidence of bacterial infection or drainage bilaterally.  Orthopedic  No limitations of motion  feet .  No crepitus or effusions noted.  No bony pathology or digital deformities noted.  Skin  normotropic skin with no porokeratosis noted bilaterally.  No signs of infections or ulcers noted.    Onychomycosis  B/L  Peripheral vascular disease  IE.  Debride nails.  Told this patient he needs to continue to elevate his legs and wear his compression socks.  RTC 3 months.   Gardiner Barefoot DPM

## 2018-07-14 ENCOUNTER — Other Ambulatory Visit: Payer: Self-pay

## 2018-07-14 ENCOUNTER — Encounter: Payer: Medicare Other | Attending: Internal Medicine | Admitting: Registered"

## 2018-07-14 DIAGNOSIS — E1165 Type 2 diabetes mellitus with hyperglycemia: Secondary | ICD-10-CM | POA: Insufficient documentation

## 2018-07-14 NOTE — Patient Instructions (Signed)
-  Walk around the table 5-6 laps two times per day.   -Try [place] bacon with tomato and 1-2 biscuits at breakfast.   -Have a snack between breakfast and dinner.   -Balance carbohydrate and protein at snacks. Example: half a honey bun with a handful of cashews; a banana with a handful of nuts.

## 2018-07-14 NOTE — Progress Notes (Addendum)
Diabetes Self-Management Education  Visit Type: First/Initial  Appt. Start Time: 2:11Pm Appt. End Time: 3:25PM  07/14/2018  [name], identified by name and date of birth, is a 68 y.o. male with a diagnosis of Diabetes: Type 2.   ASSESSMENT  Pt expectations: learn more about diabetes, right foods to eat   Pt lives with wife, pt's wife and her sister prepare the meals. Pt's wife and sister also do the grocery shopping. Pt has several co-morbidities that make physical activity challenging. Reports he does laps around the kitchen table for exercise, but can only tolerate 5-6 laps at a time. Discussed doing this twice per day as a way to add in extra physical activity that he can tolerate. Discussed carbohydrate containing foods and varying amounts in foods, and explained the importance of eating well-rounded meals and snacks. Discussed the importance of meal timing and eating every 3-5 hours. Worked with patient to plan meals that are familiar to him and within budget. Discussed eating 1-2 biscuits at breakfast, rather than 3-4; and eating half of a honey bun instead of a whole honey bun as a start to making more beneficial food choices. Demonstrated glucometer and discussed when to check and goal ranges. Pt expressed motivation to work on goals.   Glucometer Given:  Accucheck Guide Me  [id number] Exp: 06/14/2019   There were no vitals taken for this visit. There is no height or weight on file to calculate BMI.  Diabetes Self-Management Education - 07/14/18 1409      Visit Information   Visit Type  First/Initial      Initial Visit   Diabetes Type  Type 2    Are you currently following a meal plan?  No    Are you taking your medications as prescribed?  Yes    Date Diagnosed  Feb. 2020      Health Coping   How would you rate your overall health?  Good      Psychosocial Assessment   Patient Belief/Attitude about Diabetes  Motivated to manage diabetes    Self-care barriers  --     Other persons present  Patient    Patient Concerns  Nutrition/Meal planning    How often do you need to have someone help you when you read instructions, pamphlets, or other written materials from your doctor or pharmacy?  1 - Never    What is the last grade level you completed in school?  10      Pre-Education Assessment   Patient understands the diabetes disease and treatment process.  Needs Instruction    Patient understands incorporating nutritional management into lifestyle.  Needs Instruction    Patient undertands incorporating physical activity into lifestyle.  Needs Instruction    Patient understands using medications safely.  Needs Instruction    Patient understands monitoring blood glucose, interpreting and using results  Needs Instruction    Patient understands prevention, detection, and treatment of acute complications.  Needs Instruction    Patient understands prevention, detection, and treatment of chronic complications.  Needs Instruction    Patient understands how to develop strategies to address psychosocial issues.  Needs Instruction    Patient understands how to develop strategies to promote health/change behavior.  Needs Instruction      Complications   Last HgB A1C per patient/outside source  7.9 %    How often do you check your blood sugar?  0 times/day (not testing)    Fasting Blood glucose range (mg/dL)  --  not checking   Number of hypoglycemic episodes per month  1    Can you tell when your blood sugar is low?  No    Have you had a dilated eye exam in the past 12 months?  Yes    Have you had a dental exam in the past 12 months?  No    Are you checking your feet?  Yes    How many days per week are you checking your feet?  4      Dietary Intake   Breakfast  Frosted Flakes or Shredded Wheat cereal with milk, and 1 sausage patty and 2 scrambled eggs with butter and 2 pieces of buttered toast    9-9:30AM   Snack (morning)  None     Lunch  None     Snack  (afternoon)  None     Dinner  Potatoes, beans, pork chops or chicken; spaghetti or lasanga    5-6PM   Snack (evening)  Saltine crackers (8-10)    Beverage(s)  3-4c coffee with 1 sweet n low; apple juice 12oz; water 36oz.       Exercise   Exercise Type  ADL's    How many days per week to you exercise?  4    How many minutes per day do you exercise?  15    Total minutes per week of exercise  60      Patient Education   Previous Diabetes Education  No    Disease state   Definition of diabetes, type 1 and 2, and the diagnosis of diabetes    Nutrition management   Role of diet in the treatment of diabetes and the relationship between the three main macronutrients and blood glucose level;Reviewed blood glucose goals for pre and post meals and how to evaluate the patients' food intake on their blood glucose level.    Physical activity and exercise   Role of exercise on diabetes management, blood pressure control and cardiac health.    Monitoring  Taught/evaluated SMBG meter.;Purpose and frequency of SMBG.;Interpreting lab values - A1C, lipid, urine microalbumina.;Identified appropriate SMBG and/or A1C goals.;Taught/discussed recording of test results and interpretation of SMBG.    Acute complications  Taught treatment of hypoglycemia - the 15 rule.;Covered sick day management with medication and food.    Psychosocial adjustment  Role of stress on diabetes    Personal strategies to promote health  Lifestyle issues that need to be addressed for better diabetes care      Individualized Goals (developed by patient)   Nutrition  General guidelines for healthy choices and portions discussed    Physical Activity  Exercise 3-5 times per week;30 minutes per day    Medications  take my medication as prescribed    Monitoring   test my blood glucose as discussed    Reducing Risk  examine blood glucose patterns;treat hypoglycemia with 15 grams of carbs if blood glucose less than 70mg /dL;increase portions of  olive oil in diet      Post-Education Assessment   Patient understands the diabetes disease and treatment process.  Demonstrates understanding / competency    Patient understands incorporating nutritional management into lifestyle.  Demonstrates understanding / competency    Patient undertands incorporating physical activity into lifestyle.  Demonstrates understanding / competency    Patient understands using medications safely.  Demonstrates understanding / competency    Patient understands monitoring blood glucose, interpreting and using results  Demonstrates understanding / competency    Patient understands prevention,  detection, and treatment of acute complications.  Demonstrates understanding / competency    Patient understands prevention, detection, and treatment of chronic complications.  Needs Review    Patient understands how to develop strategies to address psychosocial issues.  Needs Review    Patient understands how to develop strategies to promote health/change behavior.  Needs Review      Outcomes   Expected Outcomes  Demonstrated interest in learning. Expect positive outcomes    Future DMSE  --   Will call for follow up. Would like to schedule with wife and her sister, who prepare the food.   Program Status  Not Completed       Individualized Plan for Diabetes Self-Management Training:   Learning Objective:  Patient will have a greater understanding of diabetes self-management. Patient education plan is to attend individual and/or group sessions per assessed needs and concerns.   Plan:   Patient Instructions  -Walk around the table 5-6 laps two times per day.   -Try [place] bacon with tomato and 1-2 biscuits at breakfast (instead of usual 3-4)   -Have a snack between breakfast and dinner.   -Balance carbohydrate and protein at snacks. Example: half a honey bun with a handful of cashews (instead of current whole honey bun); a banana with a handful of nuts.     Expected Outcomes:  Demonstrated interest in learning. Expect positive outcomes  Education material provided: ADA Diabetes: Your Take Control Guide, Meal plan card, My Plate and Support group flyer, Snack Sheet   If problems or questions, patient to contact team via:  Phone, e-mail   Future DSME appointment: (Will call for follow up. Would like to schedule with wife and her sister, who prepare the food.)

## 2018-08-30 DIAGNOSIS — Z1389 Encounter for screening for other disorder: Secondary | ICD-10-CM | POA: Diagnosis not present

## 2018-08-30 DIAGNOSIS — I10 Essential (primary) hypertension: Secondary | ICD-10-CM | POA: Diagnosis not present

## 2018-08-30 DIAGNOSIS — E782 Mixed hyperlipidemia: Secondary | ICD-10-CM | POA: Diagnosis not present

## 2018-08-30 DIAGNOSIS — E1165 Type 2 diabetes mellitus with hyperglycemia: Secondary | ICD-10-CM | POA: Diagnosis not present

## 2018-08-30 DIAGNOSIS — J449 Chronic obstructive pulmonary disease, unspecified: Secondary | ICD-10-CM | POA: Diagnosis not present

## 2018-08-30 DIAGNOSIS — Z0001 Encounter for general adult medical examination with abnormal findings: Secondary | ICD-10-CM | POA: Diagnosis not present

## 2018-08-30 DIAGNOSIS — I2510 Atherosclerotic heart disease of native coronary artery without angina pectoris: Secondary | ICD-10-CM | POA: Diagnosis not present

## 2018-09-02 ENCOUNTER — Encounter: Payer: Self-pay | Admitting: Cardiology

## 2018-09-05 ENCOUNTER — Other Ambulatory Visit: Payer: Self-pay

## 2018-09-05 ENCOUNTER — Encounter: Payer: Self-pay | Admitting: Cardiology

## 2018-09-05 ENCOUNTER — Ambulatory Visit: Payer: Self-pay | Admitting: Cardiology

## 2018-09-05 ENCOUNTER — Ambulatory Visit (INDEPENDENT_AMBULATORY_CARE_PROVIDER_SITE_OTHER): Payer: Medicare Other | Admitting: Cardiology

## 2018-09-05 VITALS — BP 153/78 | HR 58 | Temp 96.4°F | Ht 67.0 in | Wt 174.0 lb

## 2018-09-05 DIAGNOSIS — Z8679 Personal history of other diseases of the circulatory system: Secondary | ICD-10-CM

## 2018-09-05 DIAGNOSIS — Z8673 Personal history of transient ischemic attack (TIA), and cerebral infarction without residual deficits: Secondary | ICD-10-CM | POA: Diagnosis not present

## 2018-09-05 DIAGNOSIS — I77810 Thoracic aortic ectasia: Secondary | ICD-10-CM

## 2018-09-05 DIAGNOSIS — Z9889 Other specified postprocedural states: Secondary | ICD-10-CM | POA: Diagnosis not present

## 2018-09-05 DIAGNOSIS — I10 Essential (primary) hypertension: Secondary | ICD-10-CM | POA: Diagnosis not present

## 2018-09-05 DIAGNOSIS — I739 Peripheral vascular disease, unspecified: Secondary | ICD-10-CM | POA: Diagnosis not present

## 2018-09-05 DIAGNOSIS — I6523 Occlusion and stenosis of bilateral carotid arteries: Secondary | ICD-10-CM

## 2018-09-05 DIAGNOSIS — R0609 Other forms of dyspnea: Secondary | ICD-10-CM

## 2018-09-05 DIAGNOSIS — I25728 Atherosclerosis of autologous artery coronary artery bypass graft(s) with other forms of angina pectoris: Secondary | ICD-10-CM | POA: Diagnosis not present

## 2018-09-05 MED ORDER — RIVAROXABAN 2.5 MG PO TABS: 2.5000 mg | ORAL_TABLET | Freq: Two times a day (BID) | ORAL | 1 refills | Status: DC

## 2018-09-05 MED ORDER — AMLODIPINE BESYLATE 5 MG PO TABS: 5.0000 mg | ORAL_TABLET | Freq: Every day | ORAL | 1 refills | Status: DC

## 2018-09-05 NOTE — Progress Notes (Signed)
Primary Physician/Referring:  Benito Mccreedy, MD  Patient ID: Chris Payne, male    DOB: 17-Jul-1950, 68 y.o.   MRN: [id number]  Chief Complaint  Patient presents with  . Coronary Artery Disease  . New Patient (Initial Visit)    HPI: Chris Payne  is a 68 y.o. male  with COPD, CAD s/p CABG by Dr. Roxan Hockey in 2012 (patent LIMA-LAD, patent SVG-ramus, known occluded SVG-diagonal, and SVG-RCA as of 2015), HTN, HLD, Hx CVA, PAD, AAA s/p repair in 2011 using Dacron graft with aortobifemoral graft goal-2011, and former tobacco usereferred to [place] by Dr. Iona Beard Osei-Bonsu to establish care.  He was previously followed by Dr. Purnell Shoemaker; however, has not seen him since 2016. He does have aortic root dilatation 44 mm by echo, 45 by CT.  His main complaint today is pain in his legs and groin area. For the last 1 month, he has had pain and weakness in his legs with walking. States that his legs feel cold sometimes. Also notices that he has some lower abdominal pain and swelling in his groin area. Reports he was taken off ASA 1 year ago due to bruising. He denies any ulcerations. Has chronic leg swelling that improves with leg elevation. States he has had venous ablation in his right leg several years ago.   He denies any chest pain. Does have shortness of breath with exertion. No PND or orthopnea. Has chronic cough and wheezing. He uses a seated walker for assistance with walking due to residual weakness and previous hip surgery. Denies any palpitations.   Patient lives at home with his girlfriend.  He reports able to walk for 5-10 minutes which is worse than before. Former smoker that quit a few years ago. His girlfriend does continue to smoke.   Past Medical History:  Diagnosis Date  . AAA (abdominal aortic aneurysm) ([place])    s/p repair 6/11  . CAD (coronary artery disease)    s/p CABG 2/12:  LIMA to LAD, SVG to diagonal-1, SVG to ramus intermedius,, SVG to AM (Dr. Roxan Hockey) ;  b.   Myoview 10/13:  inf infarct with very mild peri-infarct ischemia, EF 49%, inf HK; c  He has severe three-vessel native coronary artery disease. Catheterization March 2015 as above  . CAP (community acquired pneumonia) 02/23/2014  . Carotid stenosis    a.  dopplers 1/96: LICA 22-29%;  b. Carotid dopplers 3/14:  R 0-39%, L 60-79%, repeat 6 mos  . COPD (chronic obstructive pulmonary disease) ([place])   . GERD (gastroesophageal reflux disease)   . Headache(784.0)   . Hyperlipidemia   . Hypertension   . Hypoxia 02/23/2014  . Inguinal hernia   . Myocardial infarction [facility])    PCI x3  . PAD (peripheral artery disease) ([place])    ABIs 4/12:  R 0.88, L 0.92; R SFA 40%, L CFA 50%, L SFA 50-60%  . Pneumonia    hx  . Stroke [facility])    h/o pontine CVA  . Thyroid nodule    incidental finding on carotid doppler 3/14 => thyroid U/S ordered    Past Surgical History:  Procedure Laterality Date  . ABDOMINAL AORTIC ANEURYSM REPAIR  10-03-2009  . CORONARY ARTERY BYPASS GRAFT  06/24/2010   LIMA to the LAD, SVG to first diagonal, SVG to ramus intermediate, SVG to acute marginal. EF 50%. 2/12  . EXPLORATION POST OPERATIVE OPEN HEART    . EXPLORATORY LAPAROTOMY  09/2009   ligation of lumbar arteries  . EXTERNAL  EAR SURGERY Left    laceration child  . HIP SURGERY  40 years ago   left hip bone removal and pinning  . INGUINAL HERNIA REPAIR Left 07/21/2012   Procedure: HERNIA REPAIR INGUINAL ADULT;  Surgeon: Merrie Roof, MD;  Location: [place];  Service: General;  Laterality: Left;  . INSERTION OF MESH Left 07/21/2012   Procedure: INSERTION OF MESH;  Surgeon: Merrie Roof, MD;  Location: [place];  Service: General;  Laterality: Left;  . LEFT HEART CATHETERIZATION WITH CORONARY/GRAFT ANGIOGRAM  06/21/2012   Procedure: LEFT HEART CATHETERIZATION WITH Beatrix Fetters;  Surgeon: Minus Breeding, MD;  Location: [facility] CATH LAB;  Service: Cardiovascular;;  . LEFT HEART CATHETERIZATION WITH CORONARY/GRAFT ANGIOGRAM  N/A 07/04/2013   Procedure: LEFT HEART CATHETERIZATION WITH Beatrix Fetters;  Surgeon: Sinclair Grooms, MD;  Location: [facility] CATH LAB;  Service: Cardiovascular;  Laterality: N/A;  . NM MYOCAR PERF WALL MOTION  12/02/2010   inferoapical defect is fixed c/w prior infarction/scar, there is minimal borderzone ischemia.    Social History   Socioeconomic History  . Marital status: Single    Spouse name: Rod Holler  . Number of children: 0  . Years of education: 10  . Highest education level: Not on file  Occupational History  . Occupation: disabled    Comment: back pain, hernia, CAD, AAA, Stroke  Social Needs  . Financial resource strain: Not on file  . Food insecurity:    Worry: Not on file    Inability: Not on file  . Transportation needs:    Medical: Not on file    Non-medical: Not on file  Tobacco Use  . Smoking status: Former Smoker    Packs/day: 1.00    Years: 30.00    Pack years: 30.00    Types: Cigarettes    Last attempt to quit: 01/25/2010    Years since quitting: 8.6  . Smokeless tobacco: Never Used  Substance and Sexual Activity  . Alcohol use: No    Comment: former drinker - Quit 2011.  . Drug use: No  . Sexual activity: Never  Lifestyle  . Physical activity:    Days per week: Not on file    Minutes per session: Not on file  . Stress: Not on file  Relationships  . Social connections:    Talks on phone: Not on file    Gets together: Not on file    Attends religious service: Not on file    Active member of club or organization: Not on file    Attends meetings of clubs or organizations: Not on file    Relationship status: Not on file  . Intimate partner violence:    Fear of current or ex partner: Not on file    Emotionally abused: Not on file    Physically abused: Not on file    Forced sexual activity: Not on file  Other Topics Concern  . Not on file  Social History Narrative   Marital status: married x 16 years; first marriage      Children:  None; 3  stepchildren; 4 grandchildren.  No gg.      Lives: with wife, Joslyn Hy and his girlfriend.      Employment: disability since 2010 CABG, AAA, CVA.  Previous furniture work.      Tobacco:  Quit in 2009; smoked 30 years.      Alcohol: quit in 2009; weekend drinking      ADLs: uses rolling walker within the  home; has four pronged walker at home; has drivers license yet no car.  Cooks; no cleaning; no yard work.    Education 10th grade.   Right handed.   Caffeine five cups of coffee daily.      Advanced Directives:  DNR/DNI.      Current Outpatient Medications on File Prior to Visit  Medication Sig Dispense Refill  . albuterol (PROVENTIL HFA;VENTOLIN HFA) 108 (90 Base) MCG/ACT inhaler Inhale 2 puffs into the lungs every 6 (six) hours as needed for wheezing or shortness of breath. 1 Inhaler 0  . budesonide-formoterol (SYMBICORT) 80-4.5 MCG/ACT inhaler Inhale 2 puffs into the lungs 2 (two) times daily.    . Fluticasone-Salmeterol (ADVAIR DISKUS) 250-50 MCG/DOSE AEPB Inhale 1 puff into the lungs 2 (two) times daily. 1 each 0  . furosemide (LASIX) 20 MG tablet Take 20 mg by mouth.    . gabapentin (NEURONTIN) 300 MG capsule Take 300 mg by mouth 2 (two) times daily.    . metFORMIN (GLUCOPHAGE) 500 MG tablet Take 0.5 tablets by mouth 2 (two) times daily.    . metoprolol tartrate (LOPRESSOR) 25 MG tablet Take 25 mg by mouth 2 (two) times a day.    . rosuvastatin (CRESTOR) 40 MG tablet Take 40 mg by mouth daily.    Marland Kitchen aspirin EC 81 MG EC tablet Take 1 tablet (81 mg total) by mouth daily. (Patient not taking: Reported on 09/05/2018)     No current facility-administered medications on file prior to visit.     Review of Systems  Constitution: Negative for decreased appetite, malaise/fatigue, weight gain and weight loss.  Eyes: Negative for visual disturbance.  Cardiovascular: Positive for claudication, dyspnea on exertion and leg swelling. Negative for chest pain, orthopnea, palpitations and syncope.   Respiratory: Positive for cough. Negative for hemoptysis and wheezing.   Endocrine: Negative for cold intolerance and heat intolerance.  Hematologic/Lymphatic: Does not bruise/bleed easily.  Skin: Negative for color change, nail changes and poor wound healing.  Musculoskeletal: Negative for muscle weakness and myalgias.  Gastrointestinal: Negative for abdominal pain, change in bowel habit, nausea and vomiting.  Neurological: Negative for difficulty with concentration, dizziness, focal weakness and headaches.  Psychiatric/Behavioral: Negative for altered mental status and suicidal ideas.  All other systems reviewed and are negative.     Objective  Blood pressure (!) 153/78, pulse (!) 58, temperature (!) 96.4 F (35.8 C), height 5\' 7"  (1.702 m), weight 174 lb (78.9 kg), SpO2 97 %. Body mass index is 27.25 kg/m.    Physical Exam  Constitutional: He is oriented to person, place, and time. Vital signs are normal. He appears well-developed and well-nourished.  HENT:  Head: Normocephalic and atraumatic.  Neck: Normal range of motion. Neck supple.  Cardiovascular: Normal rate, regular rhythm, normal heart sounds and intact distal pulses.  Pulses:      Femoral pulses are 2+ on the right side and 1+ on the left side.      Popliteal pulses are 1+ on the right side and 1+ on the left side.       Dorsalis pedis pulses are 1+ on the right side and 1+ on the left side.       Posterior tibial pulses are 0 on the right side and 0 on the left side.  2+ pitting edema Cold LE, dusky DP 0.5 bilateral Popliteal 0.5   Pulmonary/Chest: Effort normal. No accessory muscle usage. No respiratory distress. He has wheezes. He has rhonchi.  Abdominal: Soft. Bowel sounds are  normal.  Musculoskeletal: Normal range of motion.  Neurological: He is alert and oriented to person, place, and time.  Skin: Skin is warm and dry.  Psychiatric: He has a normal mood and affect. His behavior is normal.  Vitals reviewed.   Radiology:  CT of abdomen and pelvis 12/04/2017:  1. No acute abnormality. 2. Mild diffuse hepatic steatosis. 3. Cholelithiasis. 4. Stable small right adrenal adenoma. 5. Small midline ventral hernia containing fat. 6. Atheromatous arterial calcifications, including the coronary arteries. 7. Interval 4.3 cm infrarenal abdominal aortic aneurysm at the location of the proximal portion of an aortobifemoral bypass graft. Recommend followup by ultrasound in 1 year. This recommendation follows ACR consensus guidelines: White Paper of the ACR Incidental Findings Committee II on Vascular Findings. J Am Coll Radiol 2013; 10:789-794. 8. 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 9. Small midline ventral hernia containing fat.  CTA of chest 09/21/2013: There is stable dilatation of the ascending thoracic aorta. The maximal diameter at the level of the sinuses of Valsalva is 4.3 cm. The proximal ascending thoracic aorta measures 4.2 cm. The proximal arch measures 3.8 cm. The distal arch measures 3.3 cm. The descending thoracic aorta measures 2.7 cm. The patient is status post CABG. There is no evidence of aortic dissection.  CTA of chest 2014: 1.  No evidence of pulmonary embolism. 2.  COPD/emphysema.  Scarring in the lower lobes, right middle lobe, and lingula.  No acute cardiopulmonary disease. 3.  Ectatic ascending thoracic aorta measuring 4.5 cm diameter, unchanged since February, 2012.  MRI/MRA of brain 08/23/2013:  1. Small but patent right vertebral artery to the level of the right PICA. 2. 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 There is flow within the posterior cerebral arteries bilaterally, likely via collateral circulation from the anterior circulation. Overall, these findings are not significantly changed relative to previous MRA from 10/10/2009. MRA: 1. Small but patent right vertebral artery  to the level of the right PICA. 2. 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 There is flow within the posterior cerebral arteries bilaterally, likely via collateral circulation from the anterior circulation. Overall, these findings are not significantly changed relative to previous MRA from 10/10/2009. 1. No acute intracranial infarct or other abnormality identified within the brain. 2. Age-related atrophy with moderate chronic microvascular ischemic disease involving the supratentorial white matter, advanced relative to prior MRI from 10/10/2009. 3. Remote pontine, left frontal lobe, right basal ganglia, and left cerebellar infarcts.  Laboratory examination:    CMP Latest Ref Rng & Units 05/22/2018 05/21/2018 04/25/2018  Glucose 70 - 99 mg/dL 209(H) 192(H) 119(H)  BUN 8 - 23 mg/dL 10 9 11   Creatinine 0.61 - 1.24 mg/dL 0.86 0.96 0.89  Sodium 135 - 145 mmol/L 138 140 140  Potassium 3.5 - 5.1 mmol/L 4.9 3.3(L) 3.4(L)  Chloride 98 - 111 mmol/L 104 99 101  CO2 22 - 32 mmol/L 25 23 28   Calcium 8.9 - 10.3 mg/dL 8.9 8.8(L) 8.9  Total Protein 6.5 - 8.1 g/dL - - -  Total Bilirubin 0.3 - 1.2 mg/dL - - -  Alkaline Phos 38 - 126 U/L - - -  AST 15 - 41 U/L - - -  ALT 0 - 44 U/L - - -   CBC Latest Ref Rng & Units 05/22/2018 05/21/2018 04/25/2018  WBC 4.0 - 10.5 K/uL 6.2 10.0 8.4  Hemoglobin 13.0 - 17.0 g/dL  15.1 16.8 16.3  Hematocrit 39.0 - 52.0 % 47.2 52.7(H) 51.2  Platelets 150 - 400 K/uL 213 201 185   Lipid Panel     Component Value Date/Time   CHOL 206 (H) 05/24/2015 1149   TRIG 120 05/24/2015 1149   HDL 80 05/24/2015 1149   CHOLHDL 2.6 05/24/2015 1149   VLDL 24 05/24/2015 1149   LDLCALC 102 05/24/2015 1149   HEMOGLOBIN A1C Lab Results  Component Value Date   HGBA1C 5.4 05/24/2015   MPG 108 05/24/2015   TSH No results for input(s): TSH in the last 8760 hours.  Cardiac Studies:   AAA duplex 09/15/2016: aortobifemoral bypass graft  is patent without focal stenosis. Normal caliber abdominal aorta.  Carotid duplex 09/03/2016: Heterogeneous plaque, bilaterally. Stable 1-39% RICA stenosis. Stable 9-50% LICA stenosis. Normal subclavian arteries, bilaterally. Patent right vertebral artery with antegrade flow. Occlusion of the left vertebral artery.  Lexiscan stress test 10/23/2013: Diaphragmatic attenuation of the inferior wall and mild apical Thinning. No findings for infarction or ischemia. Ejection fraction calculated at 58%  Echo 2015: Left ventricle: The cavity size was normal. Systolic function was normal. The estimated ejection fraction was in the range of 55% to 60%. Wall motion was normal; there were no regional wall motion abnormalities. Left ventricular diastolic function parameters were normal. Acoustic contrast opacification revealed no evidence ofthrombus. - Aortic valve: Trivial regurgitation.  Assessment   Atherosclerosis of autologous artery coronary artery bypass graft with stable angina ([place]) - Plan: rivaroxaban (XARELTO) 2.5 MG TABS tablet, PCV ECHOCARDIOGRAM COMPLETE, PCV MYOCARDIAL PERFUSION WITH LEXISCAN  PAD (peripheral artery disease) ([place]) - Plan: rivaroxaban (XARELTO) 2.5 MG TABS tablet, PCV LOWER ARTERIAL (BILATERAL)  History of pontine CVA  S/P AAA repair - Plan: PCV AORTA DUPLEX  Essential hypertension - Plan: amLODipine (NORVASC) 5 MG tablet  Aortic root dilation ([place]) - Plan: PCV ECHOCARDIOGRAM COMPLETE  Dyspnea on exertion - Plan: PCV ECHOCARDIOGRAM COMPLETE, PCV MYOCARDIAL PERFUSION WITH LEXISCAN  Carotid stenosis, bilateral - Plan: PCV CAROTID DUPLEX (BILATERAL)  EKG 05/21/2018: Sinus tachycardia at 119 bpm, left axis deviation, poor R wave progression.   Recommendations:   Chris Payne  is a 68 y.o. male  with COPD, CAD s/p CABG by Dr. Roxan Hockey in 2012 (patent LIMA-LAD, patent SVG-ramus, known occluded SVG-diagonal, and SVG-RCA as of 2015), HTN, HLD, Hx  CVA, PAD, AAA s/p repair in 2011 using Dacron graft with aortobifemoral graft goal-2011, and former tobacco usereferred to [place] by Dr. Iona Beard Osei-Bonsu to establish care.  Patient has recently had worsening claudication symptoms and has ischemic changes with temperature changes and dusky appearance noted on exam. He will need lower extremity duplex for further evaluation and possibly PV angiogram. No ulcerations or limb threatening ischemia. In view of his CAD and PAD, he would benefit from low dose Xarelto. Samples were provided today. Will also obtain aortic duplex for surveillance of AAA. Last CT of abdomen in August 2019 was stable.  He has not had any exertional chest pain; however, activity is limited due to weakness, claudication and dyspnea that is likely multifactoral. As he has not had any cardiac evaluation in several years, would recommend lexiscan nuclear stress test for further evaluation as well as echocardiogram. Has history of aortic root dilation with last CT scan of chest in 2015.   Blood pressure is elevated today, will start amlodipine 5 mg daily. Continue with Crestor for hyperlipidemia. Do not have recent lipids, will request from PCP office for further risk stratification. Otherwise, on appropriate  medical therapy. States diabetes is well controlled.   He also has history of bilateral carotid stenosis that has been stable, but not evaluated since 2018. Has not had any recurrent CVA symptoms since 2011. Has residual weakness from this. Will obtain carotid duplex of surveillance of carotid stenosis.  I have congratulated patient on quitting smoking several years ago; however, I am concerned about his second hand smoke exposure. I have discussed that he continues to have risk associated with this and that he should have a discussion with his girlfriend about quitting for both of their health. He states that he will talk to her. I will see him back in the office in a few weeks after the  test for follow up.   "Total time spent with patient was 60 minutes and greater than 50% of that time was spent in counseling and coordination care with the patient and/or family regarding multiple complex issues and discussing plan of care"     *I have discussed this case with Dr. Virgina Jock and he personally examined the patient and participated in formulating the plan.*    Miquel Dunn, MSN, APRN, FNP-C [facility]. [place] Office: [phone] Fax: [phone]

## 2018-09-06 ENCOUNTER — Encounter: Payer: Self-pay | Admitting: Cardiology

## 2018-09-21 ENCOUNTER — Other Ambulatory Visit: Payer: Medicare Other

## 2018-09-21 ENCOUNTER — Ambulatory Visit: Payer: Medicare Other | Admitting: Podiatry

## 2018-09-22 ENCOUNTER — Other Ambulatory Visit: Payer: Self-pay

## 2018-09-22 ENCOUNTER — Ambulatory Visit (INDEPENDENT_AMBULATORY_CARE_PROVIDER_SITE_OTHER): Payer: Medicare Other

## 2018-09-22 DIAGNOSIS — I739 Peripheral vascular disease, unspecified: Secondary | ICD-10-CM

## 2018-09-22 DIAGNOSIS — R0609 Other forms of dyspnea: Secondary | ICD-10-CM

## 2018-09-22 DIAGNOSIS — I25728 Atherosclerosis of autologous artery coronary artery bypass graft(s) with other forms of angina pectoris: Secondary | ICD-10-CM | POA: Diagnosis not present

## 2018-09-22 DIAGNOSIS — I77810 Thoracic aortic ectasia: Secondary | ICD-10-CM

## 2018-09-23 ENCOUNTER — Ambulatory Visit (INDEPENDENT_AMBULATORY_CARE_PROVIDER_SITE_OTHER): Payer: Medicare Other

## 2018-09-23 DIAGNOSIS — I25728 Atherosclerosis of autologous artery coronary artery bypass graft(s) with other forms of angina pectoris: Secondary | ICD-10-CM

## 2018-09-23 DIAGNOSIS — R0609 Other forms of dyspnea: Secondary | ICD-10-CM

## 2018-10-04 ENCOUNTER — Other Ambulatory Visit: Payer: Self-pay

## 2018-10-04 ENCOUNTER — Ambulatory Visit: Payer: Medicare Other

## 2018-10-04 DIAGNOSIS — Z9889 Other specified postprocedural states: Secondary | ICD-10-CM

## 2018-10-04 DIAGNOSIS — I6523 Occlusion and stenosis of bilateral carotid arteries: Secondary | ICD-10-CM | POA: Diagnosis not present

## 2018-10-04 DIAGNOSIS — Z8679 Personal history of other diseases of the circulatory system: Secondary | ICD-10-CM

## 2018-10-07 DIAGNOSIS — R0602 Shortness of breath: Secondary | ICD-10-CM | POA: Diagnosis not present

## 2018-10-07 DIAGNOSIS — I10 Essential (primary) hypertension: Secondary | ICD-10-CM | POA: Diagnosis not present

## 2018-10-12 ENCOUNTER — Ambulatory Visit: Payer: Medicare Other | Admitting: Cardiology

## 2018-10-17 ENCOUNTER — Inpatient Hospital Stay (HOSPITAL_COMMUNITY)
Admission: AD | Admit: 2018-10-17 | Discharge: 2018-10-19 | DRG: 293 | Disposition: A | Discharge status: Home / Self Care | Payer: Medicare Other | Source: Ambulatory Visit | Attending: Cardiology | Admitting: Cardiology

## 2018-10-17 ENCOUNTER — Other Ambulatory Visit: Payer: Self-pay

## 2018-10-17 ENCOUNTER — Encounter: Payer: Self-pay | Admitting: Cardiology

## 2018-10-17 ENCOUNTER — Ambulatory Visit (INDEPENDENT_AMBULATORY_CARE_PROVIDER_SITE_OTHER): Payer: Medicare Other | Admitting: Cardiology

## 2018-10-17 ENCOUNTER — Encounter (HOSPITAL_COMMUNITY): Payer: Self-pay | Admitting: General Practice

## 2018-10-17 VITALS — BP 106/71 | HR 69 | Ht 67.0 in | Wt 150.0 lb

## 2018-10-17 DIAGNOSIS — R21 Rash and other nonspecific skin eruption: Secondary | ICD-10-CM | POA: Diagnosis not present

## 2018-10-17 DIAGNOSIS — Z7901 Long term (current) use of anticoagulants: Secondary | ICD-10-CM | POA: Diagnosis not present

## 2018-10-17 DIAGNOSIS — Z8673 Personal history of transient ischemic attack (TIA), and cerebral infarction without residual deficits: Secondary | ICD-10-CM

## 2018-10-17 DIAGNOSIS — J449 Chronic obstructive pulmonary disease, unspecified: Secondary | ICD-10-CM | POA: Diagnosis present

## 2018-10-17 DIAGNOSIS — I110 Hypertensive heart disease with heart failure: Principal | ICD-10-CM | POA: Diagnosis present

## 2018-10-17 DIAGNOSIS — I252 Old myocardial infarction: Secondary | ICD-10-CM | POA: Diagnosis not present

## 2018-10-17 DIAGNOSIS — R0602 Shortness of breath: Secondary | ICD-10-CM | POA: Diagnosis not present

## 2018-10-17 DIAGNOSIS — I5023 Acute on chronic systolic (congestive) heart failure: Secondary | ICD-10-CM | POA: Diagnosis present

## 2018-10-17 DIAGNOSIS — I739 Peripheral vascular disease, unspecified: Secondary | ICD-10-CM | POA: Diagnosis present

## 2018-10-17 DIAGNOSIS — R05 Cough: Secondary | ICD-10-CM | POA: Diagnosis not present

## 2018-10-17 DIAGNOSIS — I5043 Acute on chronic combined systolic (congestive) and diastolic (congestive) heart failure: Secondary | ICD-10-CM | POA: Diagnosis present

## 2018-10-17 DIAGNOSIS — Z9861 Coronary angioplasty status: Secondary | ICD-10-CM | POA: Diagnosis not present

## 2018-10-17 DIAGNOSIS — Z7984 Long term (current) use of oral hypoglycemic drugs: Secondary | ICD-10-CM

## 2018-10-17 DIAGNOSIS — R001 Bradycardia, unspecified: Secondary | ICD-10-CM | POA: Diagnosis present

## 2018-10-17 DIAGNOSIS — I6523 Occlusion and stenosis of bilateral carotid arteries: Secondary | ICD-10-CM

## 2018-10-17 DIAGNOSIS — B3789 Other sites of candidiasis: Secondary | ICD-10-CM

## 2018-10-17 DIAGNOSIS — Z20828 Contact with and (suspected) exposure to other viral communicable diseases: Secondary | ICD-10-CM | POA: Diagnosis present

## 2018-10-17 DIAGNOSIS — Z8679 Personal history of other diseases of the circulatory system: Secondary | ICD-10-CM

## 2018-10-17 DIAGNOSIS — I2510 Atherosclerotic heart disease of native coronary artery without angina pectoris: Secondary | ICD-10-CM | POA: Diagnosis not present

## 2018-10-17 DIAGNOSIS — Z9889 Other specified postprocedural states: Secondary | ICD-10-CM

## 2018-10-17 DIAGNOSIS — Z7951 Long term (current) use of inhaled steroids: Secondary | ICD-10-CM

## 2018-10-17 DIAGNOSIS — Z951 Presence of aortocoronary bypass graft: Secondary | ICD-10-CM

## 2018-10-17 DIAGNOSIS — I25720 Atherosclerosis of autologous artery coronary artery bypass graft(s) with unstable angina pectoris: Secondary | ICD-10-CM | POA: Diagnosis present

## 2018-10-17 DIAGNOSIS — Z87891 Personal history of nicotine dependence: Secondary | ICD-10-CM

## 2018-10-17 DIAGNOSIS — I25708 Atherosclerosis of coronary artery bypass graft(s), unspecified, with other forms of angina pectoris: Secondary | ICD-10-CM | POA: Diagnosis not present

## 2018-10-17 DIAGNOSIS — Z7982 Long term (current) use of aspirin: Secondary | ICD-10-CM | POA: Diagnosis not present

## 2018-10-17 DIAGNOSIS — Z66 Do not resuscitate: Secondary | ICD-10-CM | POA: Diagnosis not present

## 2018-10-17 DIAGNOSIS — E785 Hyperlipidemia, unspecified: Secondary | ICD-10-CM | POA: Diagnosis not present

## 2018-10-17 DIAGNOSIS — I25728 Atherosclerosis of autologous artery coronary artery bypass graft(s) with other forms of angina pectoris: Secondary | ICD-10-CM

## 2018-10-17 HISTORY — DX: Heart failure, unspecified: I50.9

## 2018-10-17 LAB — CBC WITH DIFFERENTIAL/PLATELET
Abs Immature Granulocytes: 0.02 10*3/uL (ref 0.00–0.07)
Basophils Absolute: 0 10*3/uL (ref 0.0–0.1)
Basophils Relative: 0 %
Eosinophils Absolute: 0.2 10*3/uL (ref 0.0–0.5)
Eosinophils Relative: 2 %
HCT: 50.8 % (ref 39.0–52.0)
Hemoglobin: 16.7 g/dL (ref 13.0–17.0)
Immature Granulocytes: 0 %
Lymphocytes Relative: 20 %
Lymphs Abs: 1.9 10*3/uL (ref 0.7–4.0)
MCH: 32.9 pg (ref 26.0–34.0)
MCHC: 32.9 g/dL (ref 30.0–36.0)
MCV: 100 fL (ref 80.0–100.0)
Monocytes Absolute: 0.8 10*3/uL (ref 0.1–1.0)
Monocytes Relative: 8 %
Neutro Abs: 6.6 10*3/uL (ref 1.7–7.7)
Neutrophils Relative %: 70 %
Platelets: 181 10*3/uL (ref 150–400)
RBC: 5.08 MIL/uL (ref 4.22–5.81)
RDW: 12 % (ref 11.5–15.5)
WBC: 9.6 10*3/uL (ref 4.0–10.5)
nRBC: 0 % (ref 0.0–0.2)

## 2018-10-17 LAB — COMPREHENSIVE METABOLIC PANEL
ALT: 15 U/L (ref 0–44)
AST: 17 U/L (ref 15–41)
Albumin: 3.7 g/dL (ref 3.5–5.0)
Alkaline Phosphatase: 93 U/L (ref 38–126)
Anion gap: 10 (ref 5–15)
BUN: 14 mg/dL (ref 8–23)
CO2: 26 mmol/L (ref 22–32)
Calcium: 9.3 mg/dL (ref 8.9–10.3)
Chloride: 101 mmol/L (ref 98–111)
Creatinine, Ser: 1.14 mg/dL (ref 0.61–1.24)
GFR calc Af Amer: >60 mL/min (ref >60)
GFR calc non Af Amer: >60 mL/min (ref >60)
Glucose, Bld: 125 mg/dL — ABNORMAL HIGH (ref 70–99)
Potassium: 3.4 mmol/L — ABNORMAL LOW (ref 3.5–5.1)
Sodium: 137 mmol/L (ref 135–145)
Total Bilirubin: 0.7 mg/dL (ref 0.3–1.2)
Total Protein: 6.7 g/dL (ref 6.5–8.1)

## 2018-10-17 LAB — GLUCOSE, CAPILLARY
Glucose-Capillary: 119 mg/dL — ABNORMAL HIGH (ref 70–99)
Glucose-Capillary: 176 mg/dL — ABNORMAL HIGH (ref 70–99)

## 2018-10-17 LAB — BRAIN NATRIURETIC PEPTIDE: B Natriuretic Peptide: 16.4 pg/mL (ref 0.0–100.0)

## 2018-10-17 MED ORDER — SODIUM CHLORIDE 0.9% FLUSH
3.0000 mL | Freq: Two times a day (BID) | INTRAVENOUS | Status: DC
  Administered 2018-10-17 – 2018-10-18 (×3): 3 mL via INTRAVENOUS

## 2018-10-17 MED ORDER — SODIUM CHLORIDE 0.9 % IV SOLN: 250.0000 mL | INTRAVENOUS | Status: DC | PRN

## 2018-10-17 MED ORDER — ROSUVASTATIN CALCIUM 20 MG PO TABS
40.0000 mg | ORAL_TABLET | Freq: Every day | ORAL | Status: DC
  Administered 2018-10-18 – 2018-10-19 (×2): 40 mg via ORAL
  Filled 2018-10-17 (×2): qty 2

## 2018-10-17 MED ORDER — ALBUTEROL SULFATE HFA 108 (90 BASE) MCG/ACT IN AERS
2.0000 | INHALATION_SPRAY | Freq: Four times a day (QID) | RESPIRATORY_TRACT | Status: DC | PRN
  Filled 2018-10-17: qty 6.7

## 2018-10-17 MED ORDER — ENOXAPARIN SODIUM 40 MG/0.4ML ~~LOC~~ SOLN
40.0000 mg | SUBCUTANEOUS | Status: DC
  Administered 2018-10-17 – 2018-10-18 (×2): 40 mg via SUBCUTANEOUS
  Filled 2018-10-17 (×2): qty 0.4

## 2018-10-17 MED ORDER — SODIUM CHLORIDE 0.9% FLUSH: 3.0000 mL | INTRAVENOUS | Status: DC | PRN

## 2018-10-17 MED ORDER — GABAPENTIN 300 MG PO CAPS
300.0000 mg | ORAL_CAPSULE | Freq: Two times a day (BID) | ORAL | Status: DC
  Administered 2018-10-17 – 2018-10-19 (×4): 300 mg via ORAL
  Filled 2018-10-17 (×5): qty 1

## 2018-10-17 MED ORDER — POTASSIUM CHLORIDE CRYS ER 20 MEQ PO TBCR
40.0000 meq | EXTENDED_RELEASE_TABLET | Freq: Once | ORAL | Status: AC
  Administered 2018-10-17: 40 meq via ORAL
  Filled 2018-10-17: qty 2

## 2018-10-17 MED ORDER — FUROSEMIDE 10 MG/ML IJ SOLN
40.0000 mg | Freq: Two times a day (BID) | INTRAMUSCULAR | Status: DC
  Administered 2018-10-17 – 2018-10-18 (×2): 40 mg via INTRAVENOUS
  Filled 2018-10-17 (×2): qty 4

## 2018-10-17 MED ORDER — ONDANSETRON HCL 4 MG/2ML IJ SOLN: 4.0000 mg | Freq: Four times a day (QID) | INTRAMUSCULAR | Status: DC | PRN

## 2018-10-17 MED ORDER — ASPIRIN EC 81 MG PO TBEC
81.0000 mg | DELAYED_RELEASE_TABLET | Freq: Every day | ORAL | Status: DC
  Administered 2018-10-17 – 2018-10-19 (×3): 81 mg via ORAL
  Filled 2018-10-17 (×5): qty 1

## 2018-10-17 MED ORDER — ACETAMINOPHEN 325 MG PO TABS: 650.0000 mg | ORAL_TABLET | ORAL | Status: DC | PRN

## 2018-10-17 NOTE — H&P (Signed)
Primary physician: Benito Mccreedy, MD  Patient ID: Chris Payne. Chris Payne  DOB Oct 02, 1950, 68 y/o male  MRN: [id number]  Patient note copied from office note from earlier today for documentation.  HPI: [name] is a 68 y.o. male with COPD, CAD s/p CABG by Dr. Roxan Hockey in 2012 (patent LIMA-LAD, patent SVG-ramus, known occluded SVG-diagonal, and SVG-RCA as of 2015), HTN, HLD, Hx CVA, PAD, AAA s/p repair in 2011 using Dacron graft with aortobifemoral graft goal-2011, aortic root dilation and former tobacco use. Here to discuss test results.  He does have history of aortic root dilatation 44 mm by echo, 45 by CT in 2015.   His main complaint today is pain in his legs and groin area, noted to have ischemic changes on exam at his last office visit. He was started on low dose Xarelto at his last office visit. He has pain and weakness in his legs with walking. Also notices that he has some lower abdominal pain and swelling in his groin area. He denies any ulcerations. Has chronic leg swelling, but has recently worsened. States he has had venous ablation in his right leg several years ago.   He denies any chest pain. Does have shortness of breath with exertion. No PND or orthopnea. Has chronic cough that has recently been productive and wheezing. He uses a seated walker for assistance with walking due to residual weakness and previous hip surgery. Denies any palpitations.   Patient lives at home with his girlfriend. He reports able to walk for 5-10 minutes which is worse than before. Former smoker that quit a few years ago. His girlfriend does continue to smoke.       Past Medical History:  Diagnosis Date  . AAA (abdominal aortic aneurysm) ([place])    s/p repair 6/11  . CAD (coronary artery disease)    s/p CABG 2/12: LIMA to LAD, SVG to diagonal-1, SVG to ramus intermedius,, SVG to AM (Dr. Roxan Hockey) ; b. Myoview 10/13: inf infarct with very mild peri-infarct ischemia, EF 49%, inf HK; c He  has severe three-vessel native coronary artery disease. Catheterization March 2015 as above  . CAP (community acquired pneumonia) 02/23/2014  . Carotid stenosis    a. dopplers 8/58: LICA 85-02%; b. Carotid dopplers 3/14: R 0-39%, L 60-79%, repeat 6 mos  . COPD (chronic obstructive pulmonary disease) ([place])   . GERD (gastroesophageal reflux disease)   . Headache(784.0)   . Hyperlipidemia   . Hypertension   . Hypoxia 02/23/2014  . Inguinal hernia   . Myocardial infarction [facility])    PCI x3  . PAD (peripheral artery disease) ([place])    ABIs 4/12: R 0.88, L 0.92; R SFA 40%, L CFA 50%, L SFA 50-60%  . Pneumonia    hx  . Stroke [facility])    h/o pontine CVA  . Thyroid nodule    incidental finding on carotid doppler 3/14 => thyroid U/S ordered        Past Surgical History:  Procedure Laterality Date  . ABDOMINAL AORTIC ANEURYSM REPAIR  10-03-2009  . CORONARY ARTERY BYPASS GRAFT  06/24/2010   LIMA to the LAD, SVG to first diagonal, SVG to ramus intermediate, SVG to acute marginal. EF 50%. 2/12  . EXPLORATION POST OPERATIVE OPEN HEART    . EXPLORATORY LAPAROTOMY  09/2009   ligation of lumbar arteries  . EXTERNAL EAR SURGERY Left    laceration child  . HIP SURGERY  40 years ago   left hip bone removal and  pinning  . INGUINAL HERNIA REPAIR Left 07/21/2012   Procedure: HERNIA REPAIR INGUINAL ADULT; Surgeon: Merrie Roof, MD; Location: [place]; Service: General; Laterality: Left;  . INSERTION OF MESH Left 07/21/2012   Procedure: INSERTION OF MESH; Surgeon: Merrie Roof, MD; Location: [place]; Service: General; Laterality: Left;  . LEFT HEART CATHETERIZATION WITH CORONARY/GRAFT ANGIOGRAM  06/21/2012   Procedure: LEFT HEART CATHETERIZATION WITH Beatrix Fetters; Surgeon: Minus Breeding, MD; Location: [facility] CATH LAB; Service: Cardiovascular;;  . LEFT HEART CATHETERIZATION WITH CORONARY/GRAFT ANGIOGRAM N/A 07/04/2013   Procedure: LEFT HEART CATHETERIZATION WITH Beatrix Fetters; Surgeon: Sinclair Grooms, MD; Location: [facility] CATH LAB; Service: Cardiovascular; Laterality: N/A;  . NM MYOCAR PERF WALL MOTION  12/02/2010   inferoapical defect is fixed c/w prior infarction/scar, there is minimal borderzone ischemia.   Social History        Socioeconomic History  . Marital status: Single    Spouse name: Rod Holler  . Number of children: 0  . Years of education: 10  . Highest education level: Not on file  Occupational History  . Occupation: disabled    Comment: back pain, hernia, CAD, AAA, Stroke  Social Needs  . Financial resource strain: Not on file  . Food insecurity    Worry: Not on file    Inability: Not on file  . Transportation needs    Medical: Not on file    Non-medical: Not on file  Tobacco Use  . Smoking status: Former Smoker    Packs/day: 1.00    Years: 30.00    Pack years: 30.00    Types: Cigarettes    Quit date: 01/25/2010    Years since quitting: 8.7  . Smokeless tobacco: Never Used  Substance and Sexual Activity  . Alcohol use: No    Comment: former drinker - Quit 2011.  . Drug use: No  . Sexual activity: Never  Lifestyle  . Physical activity    Days per week: Not on file    Minutes per session: Not on file  . Stress: Not on file  Relationships  . Social Herbalist on phone: Not on file    Gets together: Not on file    Attends religious service: Not on file    Active member of club or organization: Not on file    Attends meetings of clubs or organizations: Not on file    Relationship status: Not on file  . Intimate partner violence    Fear of current or ex partner: Not on file    Emotionally abused: Not on file    Physically abused: Not on file    Forced sexual activity: Not on file  Other Topics Concern  . Not on file  Social History Narrative   Marital status: married x 16 years; first marriage   Children: None; 3 stepchildren; 4 grandchildren. No gg.   Lives: with wife, Joslyn Hy and his girlfriend.   Employment: disability since 2010  CABG, AAA, CVA. Previous furniture work.   Tobacco: Quit in 2009; smoked 30 years.   Alcohol: quit in 2009; weekend drinking   ADLs: uses rolling walker within the home; has four pronged walker at home; has drivers license yet no car. Cooks; no cleaning; no yard work.    Education 10th grade.   Right handed.   Caffeine five cups of coffee daily.   Advanced Directives: DNR/DNI.          Current Outpatient Medications on File Prior to  Visit  Medication Sig Dispense Refill  . albuterol (PROVENTIL HFA;VENTOLIN HFA) 108 (90 Base) MCG/ACT inhaler Inhale 2 puffs into the lungs every 6 (six) hours as needed for wheezing or shortness of breath. 1 Inhaler 0  . aspirin EC 81 MG EC tablet Take 1 tablet (81 mg total) by mouth daily.    . budesonide-formoterol (SYMBICORT) 80-4.5 MCG/ACT inhaler Inhale 2 puffs into the lungs 2 (two) times daily.    . Fluticasone-Salmeterol (ADVAIR DISKUS) 250-50 MCG/DOSE AEPB Inhale 1 puff into the lungs 2 (two) times daily. 1 each 0  . furosemide (LASIX) 20 MG tablet Take 20 mg by mouth daily.     Marland Kitchen gabapentin (NEURONTIN) 300 MG capsule Take 300 mg by mouth 2 (two) times daily.    . metFORMIN (GLUCOPHAGE) 500 MG tablet Take 0.5 tablets by mouth 2 (two) times daily.    . rivaroxaban (XARELTO) 2.5 MG TABS tablet Take 1 tablet (2.5 mg total) by mouth 2 (two) times daily. 60 tablet 1  . rosuvastatin (CRESTOR) 40 MG tablet Take 40 mg by mouth daily.     No current facility-administered medications on file prior to visit.    Review of Systems  Constitution: Negative for decreased appetite, malaise/fatigue, weight gain and weight loss.  Eyes: Negative for visual disturbance.  Cardiovascular: Positive for claudication, dyspnea on exertion and leg swelling. Negative for chest pain, orthopnea, palpitations and syncope.  Respiratory: Positive for cough. Negative for hemoptysis and wheezing.  Endocrine: Negative for cold intolerance and heat intolerance.  Hematologic/Lymphatic:  Does not bruise/bleed easily.  Skin: Negative for color change, nail changes and poor wound healing.  Musculoskeletal: Negative for muscle weakness and myalgias.  Gastrointestinal: Negative for abdominal pain, change in bowel habit, nausea and vomiting.  Neurological: Negative for difficulty with concentration, dizziness, focal weakness and headaches.  Psychiatric/Behavioral: Negative for altered mental status and suicidal ideas.  All other systems reviewed and are negative.   Objective  Blood pressure 106/71, pulse 69, height 5\' 7"  (1.702 m), weight 150 lb (68 kg), SpO2 96 %. Body mass index is 23.49 kg/m.   Physical Exam  Constitutional: He is oriented to person, place, and time. Vital signs are normal. He appears well-developed and well-nourished.  HENT:  Head: Normocephalic and atraumatic.  Neck: Normal range of motion. Neck supple.  Cardiovascular: Normal rate, regular rhythm, normal heart sounds and intact distal pulses.  Pulses:  Femoral pulses are 2+ on the right side and 1+ on the left side.  Popliteal pulses are 1+ on the right side and 1+ on the left side.  Dorsalis pedis pulses are 1+ on the right side and 1+ on the left side.  Posterior tibial pulses are 0 on the right side and 0 on the left side.  2+ pitting edema Warm bilateral LE DP 0.5 bilateral Popliteal 0.5  Pulmonary/Chest: Effort normal. No accessory muscle usage. No respiratory distress. He has wheezes. He has rhonchi.  Abdominal: Soft. Bowel sounds are normal.  Musculoskeletal: Normal range of motion.  Neurological: He is alert and oriented to person, place, and time.  Skin: Skin is warm and dry.  Psychiatric: He has a normal mood and affect. His behavior is normal.  Vitals reviewed.   Radiology:  CT of abdomen and pelvis 12/04/2017:  1. No acute abnormality.  2. Mild diffuse hepatic steatosis.  3. Cholelithiasis.  4. Stable small right adrenal adenoma.  5. Small midline ventral hernia containing fat.   6. Atheromatous arterial calcifications, including the coronary  arteries.  7. Interval 4.3 cm infrarenal abdominal aortic aneurysm at the  location of the proximal portion of an aortobifemoral bypass graft.  Recommend followup by ultrasound in 1 year. This recommendation  follows ACR consensus guidelines: White Paper of the ACR Incidental  Findings Committee II on Vascular Findings. J Am Coll Radiol 2013;  10:789-794.  8. 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  9. Small midline ventral hernia containing fat.  CTA of chest 09/21/2013: There is stable dilatation of the ascending thoracic aorta. The  maximal diameter at the level of the sinuses of Valsalva is 4.3 cm.  The proximal ascending thoracic aorta measures 4.2 cm. The proximal  arch measures 3.8 cm. The distal arch measures 3.3 cm. The  descending thoracic aorta measures 2.7 cm. The patient is status  post CABG. There is no evidence of aortic dissection.  CTA of chest 2014: 1. No evidence of pulmonary embolism.  2. COPD/emphysema. Scarring in the lower lobes, right middle  lobe, and lingula. No acute cardiopulmonary disease.  3. Ectatic ascending thoracic aorta measuring 4.5 cm diameter,  unchanged since February, 2012.  MRI/MRA of brain 08/23/2013:  1. Small but patent right vertebral artery to the level of the right  PICA.  2. Occluded distal left vertebral artery with no flow seen within  the basilar artery. There is flow within the posterior cerebral  arteries bilaterally, likely via collateral circulation from the  anterior circulation. Overall, these findings are not significantly  changed relative to previous MRA from 10/10/2009.  MRA:  1. Small but patent right vertebral artery to the level of the right  PICA.  2. Occluded distal left vertebral artery with no flow seen within  the basilar artery. There is flow within the posterior cerebral  arteries bilaterally, likely via  collateral circulation from the  anterior circulation. Overall, these findings are not significantly  changed relative to previous MRA from 10/10/2009.  1. No acute intracranial infarct or other abnormality identified  within the brain.  2. Age-related atrophy with moderate chronic microvascular ischemic  disease involving the supratentorial white matter, advanced relative  to prior MRI from 10/10/2009.  3. Remote pontine, left frontal lobe, right basal ganglia, and left  cerebellar infarcts.   Laboratory examination:   CMP Latest Ref Rng & Units 05/22/2018 05/21/2018 04/25/2018  Glucose 70 - 99 mg/dL 209(H) 192(H) 119(H)  BUN 8 - 23 mg/dL 10 9 11   Creatinine 0.61 - 1.24 mg/dL 0.86 0.96 0.89  Sodium 135 - 145 mmol/L 138 140 140  Potassium 3.5 - 5.1 mmol/L 4.9 3.3(L) 3.4(L)  Chloride 98 - 111 mmol/L 104 99 101  CO2 22 - 32 mmol/L 25 23 28   Calcium 8.9 - 10.3 mg/dL 8.9 8.8(L) 8.9  Total Protein 6.5 - 8.1 g/dL - - -  Total Bilirubin 0.3 - 1.2 mg/dL - - -  Alkaline Phos 38 - 126 U/L - - -  AST 15 - 41 U/L - - -  ALT 0 - 44 U/L - - -   CBC Latest Ref Rng & Units 05/22/2018 05/21/2018 04/25/2018  WBC 4.0 - 10.5 K/uL 6.2 10.0 8.4  Hemoglobin 13.0 - 17.0 g/dL 15.1 16.8 16.3  Hematocrit 39.0 - 52.0 % 47.2 52.7(H) 51.2  Platelets 150 - 400 K/uL 213 201 185  Lipid Panel  Labs (Brief)  HEMOGLOBIN A1C  Recent Labs                            TSH  Recent Labs (within last 365 days)     Cardiac Studies:   Carotid artery duplex 10-09-2018:  Minimal stenosis in the right carotid bulb with heterogeneous plaque.  Stenosis in the left internal carotid artery (50-69%). Antegrade right vertebral artery flow. Antegrade left vertebral artery flow.  Follow up in six months is appropriate if clinically indicated.   Abdominal Aortic Duplex Oct 09, 2018:  Moderate dilatation of the abdominal aorta is noted in the mid and distal aorta,  aortic aneurysm measuring 3.51 x 3.5 x 3.5 cm is seen. Diffuse heterogeneous plaque noted.  Iliac arteries not well visualized.  Recheck in 1 year.   [facility] Stress Test 09/23/2018:  Nondiagnostic ECG stress due to pharmacologic stress.  Perfusion images reveal a moderate to large sized inferior and inferolateral transmural scar with minimal peri-infarct ischemia. Gated SPECT images reveal inferior wall akinesis, LVEF mildly depressed at 41%. intermediate risk study.   Lower Extremity Arterial Duplex 09/22/2018:  Right lower extremity demonstrates diffuse mixed plaque throughout. There is moderately abnormal waveform throughout the right lower extremity suggestive of moderate to severe diffuse disease. Right PFA has >50% stenosis, probably > 75% stenosis. Cannot exclude inflow disease (right CIA).  lLeft ower extremity demonstrates diffuse mixed plaque throughout. Left CIA stenosis of <50%. Distal left SFA >50% stenosis.  Moderately abnormal PVR waveforms of the right ankle. Moderately abnormal PVR waveforms of the left ankle.  Mildly decreased right resting ABI at 0.85 and m moderately decreased left resting ABI 0.77.   Echocardiogram 09/22/2018:  Moderately depressed LV systolic function with EF 40%. Left ventricle cavity is normal in size. Mild concentric hypertrophy of the left ventricle. Mild global hypokinesis. Abnormal septal wall motion due to post-operative coronary artery bypass graft. Doppler evidence of grade I (impaired) diastolic dysfunction, normal LAP. Calculated EF 40%.  Mild (Grade I) aortic regurgitation.  The aortic root is mildly dilated at 4.0 cm.   Assessment     ICD-10-CM   1. PAD (peripheral artery disease) (H[facility])  I73.9   2. Acute on chronic combined systolic and diastolic CHF (congestive heart failure) ([facility])  I50.43   3. 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 Carotid stenosis, bilateral   I65.23   5. S/P AAA repair  Z98.890    Z86.79    EKG 05/21/2018: Sinus tachycardia at 119 bpm, left axis deviation, poor R wave progression.   Recommendations:   HICKS FEICK is a 68 y.o. male with COPD, CAD s/p CABG by Dr. Roxan Hockey in 2012 (patent LIMA-LAD, patent SVG-ramus, known occluded SVG-diagonal, and SVG-RCA as of 2015), HTN, HLD, Hx CVA, PAD, AAA s/p repair in 2011 using Dacron graft with aortobifemoral graft goal-2011, and former tobacco use.   Patient has exertional leg weakness, does have burning rest pain. Lower extremities are warm today. I have discussed recently obtained test results with the patient, as he does not have critical limb ischemia on exam today, I feel that his best option would be to pursue medical management. Continue with aspirin and low dose Xarelto.   Patient does have moderately depressed LVEF by recent echocardiogram that is likely chronic from previous CABG. He had minimal peri-infarct on recent Lexiscan nuclear stress test, would recommend continued aggressive medical management. Patient is presently in  acute on chronic systolic and diastolic heart failure exacerbation as noted to have significant leg swelling and reports frequent productive cough. He is presently on Lasix 20 mg daily, do not feel that oral Lasix will likely be able to achieve aggressive enough diuresis. Feel that patient would best be suited for a hospital admission for aggressive IV diuresis. Patient is agreeable to this. We will arrange for direct hospital admission.   He does have abdominal aortic aneurysm measuring 3.1 cm by recent abdominal duplex; however, was noted to be 4.3 cm by last CT of abdomen. Can consider CTA of abdomen at some point, but suspect that this has actually decreased in size. He also has aortic root dilation stable at 4.0 cm and will need annual surveillance. He also has 50-69% stenosis in left ICA and will repeat duplex in 6 months. We will see him back after  hospitalization for follow up.   *I have discussed this case with Dr. Virgina Jock and he personally examined the patient and participated in formulating the plan.*   Miquel Dunn, MSN, APRN, FNP-C  [facility]. [place]  Office: [phone]  Fax: [phone]

## 2018-10-17 NOTE — Progress Notes (Signed)
Primary Physician/Referring:  Benito Mccreedy, MD  Patient ID: Chris Payne, male    DOB: December 10, 1950, 68 y.o.   MRN: [id number]  Chief Complaint  Patient presents with  . Coronary Artery Disease  . Hypertension  . Follow-up    HPI: Chris Payne  is a 68 y.o. male  with COPD, CAD s/p CABG by Dr. Roxan Hockey in 2012 (patent LIMA-LAD, patent SVG-ramus, known occluded SVG-diagonal, and SVG-RCA as of 2015), HTN, HLD, Hx CVA, PAD, AAA s/p repair in 2011 using Dacron graft with aortobifemoral graft goal-2011, aortic root dilation  and former tobacco use. Here to discuss test results.   He does have history of aortic root dilatation 44 mm by echo, 45 by CT in 2015.  His main complaint today is pain in his legs and groin area, noted to have ischemic changes on exam at his last office visit. He was started on low dose Xarelto at his last office visit. He has pain and weakness in his legs with walking.  Also notices that he has some lower abdominal pain and swelling in his groin area.  He denies any ulcerations. Has chronic leg swelling, but has recently worsened. States he has had venous ablation in his right leg several years ago.   He denies any chest pain. Does have shortness of breath with exertion. No PND or orthopnea. Has chronic cough that has recently been productive and wheezing. He uses a seated walker for assistance with walking due to residual weakness and previous hip surgery. Denies any palpitations.   Patient lives at home with his girlfriend.  He reports able to walk for 5-10 minutes which is worse than before. Former smoker that quit a few years ago. His girlfriend does continue to smoke.   Past Medical History:  Diagnosis Date  . AAA (abdominal aortic aneurysm) ([place])    s/p repair 6/11  . CAD (coronary artery disease)    s/p CABG 2/12:  LIMA to LAD, SVG to diagonal-1, SVG to ramus intermedius,, SVG to AM (Dr. Roxan Hockey) ;  b.  Myoview 10/13:  inf infarct with very mild  peri-infarct ischemia, EF 49%, inf HK; c  He has severe three-vessel native coronary artery disease. Catheterization March 2015 as above  . CAP (community acquired pneumonia) 02/23/2014  . Carotid stenosis    a.  dopplers 0/32: LICA 12-24%;  b. Carotid dopplers 3/14:  R 0-39%, L 60-79%, repeat 6 mos  . COPD (chronic obstructive pulmonary disease) ([place])   . GERD (gastroesophageal reflux disease)   . Headache(784.0)   . Hyperlipidemia   . Hypertension   . Hypoxia 02/23/2014  . Inguinal hernia   . Myocardial infarction [facility])    PCI x3  . PAD (peripheral artery disease) ([place])    ABIs 4/12:  R 0.88, L 0.92; R SFA 40%, L CFA 50%, L SFA 50-60%  . Pneumonia    hx  . Stroke [facility])    h/o pontine CVA  . Thyroid nodule    incidental finding on carotid doppler 3/14 => thyroid U/S ordered    Past Surgical History:  Procedure Laterality Date  . ABDOMINAL AORTIC ANEURYSM REPAIR  10-03-2009  . CORONARY ARTERY BYPASS GRAFT  06/24/2010   LIMA to the LAD, SVG to first diagonal, SVG to ramus intermediate, SVG to acute marginal. EF 50%. 2/12  . EXPLORATION POST OPERATIVE OPEN HEART    . EXPLORATORY LAPAROTOMY  09/2009   ligation of lumbar arteries  . EXTERNAL EAR SURGERY Left  laceration child  . HIP SURGERY  40 years ago   left hip bone removal and pinning  . INGUINAL HERNIA REPAIR Left 07/21/2012   Procedure: HERNIA REPAIR INGUINAL ADULT;  Surgeon: Merrie Roof, MD;  Location: [place];  Service: General;  Laterality: Left;  . INSERTION OF MESH Left 07/21/2012   Procedure: INSERTION OF MESH;  Surgeon: Merrie Roof, MD;  Location: [place];  Service: General;  Laterality: Left;  . LEFT HEART CATHETERIZATION WITH CORONARY/GRAFT ANGIOGRAM  06/21/2012   Procedure: LEFT HEART CATHETERIZATION WITH Beatrix Fetters;  Surgeon: Minus Breeding, MD;  Location: [facility] CATH LAB;  Service: Cardiovascular;;  . LEFT HEART CATHETERIZATION WITH CORONARY/GRAFT ANGIOGRAM N/A 07/04/2013   Procedure: LEFT HEART  CATHETERIZATION WITH Beatrix Fetters;  Surgeon: Sinclair Grooms, MD;  Location: [facility] CATH LAB;  Service: Cardiovascular;  Laterality: N/A;  . NM MYOCAR PERF WALL MOTION  12/02/2010   inferoapical defect is fixed c/w prior infarction/scar, there is minimal borderzone ischemia.    Social History   Socioeconomic History  . Marital status: Single    Spouse name: Rod Holler  . Number of children: 0  . Years of education: 10  . Highest education level: Not on file  Occupational History  . Occupation: disabled    Comment: back pain, hernia, CAD, AAA, Stroke  Social Needs  . Financial resource strain: Not on file  . Food insecurity    Worry: Not on file    Inability: Not on file  . Transportation needs    Medical: Not on file    Non-medical: Not on file  Tobacco Use  . Smoking status: Former Smoker    Packs/day: 1.00    Years: 30.00    Pack years: 30.00    Types: Cigarettes    Quit date: 01/25/2010    Years since quitting: 8.7  . Smokeless tobacco: Never Used  Substance and Sexual Activity  . Alcohol use: No    Comment: former drinker - Quit 2011.  . Drug use: No  . Sexual activity: Never  Lifestyle  . Physical activity    Days per week: Not on file    Minutes per session: Not on file  . Stress: Not on file  Relationships  . Social Herbalist on phone: Not on file    Gets together: Not on file    Attends religious service: Not on file    Active member of club or organization: Not on file    Attends meetings of clubs or organizations: Not on file    Relationship status: Not on file  . Intimate partner violence    Fear of current or ex partner: Not on file    Emotionally abused: Not on file    Physically abused: Not on file    Forced sexual activity: Not on file  Other Topics Concern  . Not on file  Social History Narrative   Marital status: married x 16 years; first marriage      Children:  None; 3 stepchildren; 4 grandchildren.  No gg.      Lives: with  wife, Joslyn Hy and his girlfriend.      Employment: disability since 2010 CABG, AAA, CVA.  Previous furniture work.      Tobacco:  Quit in 2009; smoked 30 years.      Alcohol: quit in 2009; weekend drinking      ADLs: uses rolling walker within the home; has four pronged walker at home; has  drivers license yet no car.  Cooks; no cleaning; no yard work.    Education 10th grade.   Right handed.   Caffeine five cups of coffee daily.      Advanced Directives:  DNR/DNI.      Current Outpatient Medications on File Prior to Visit  Medication Sig Dispense Refill  . albuterol (PROVENTIL HFA;VENTOLIN HFA) 108 (90 Base) MCG/ACT inhaler Inhale 2 puffs into the lungs every 6 (six) hours as needed for wheezing or shortness of breath. 1 Inhaler 0  . aspirin EC 81 MG EC tablet Take 1 tablet (81 mg total) by mouth daily.    . budesonide-formoterol (SYMBICORT) 80-4.5 MCG/ACT inhaler Inhale 2 puffs into the lungs 2 (two) times daily.    . Fluticasone-Salmeterol (ADVAIR DISKUS) 250-50 MCG/DOSE AEPB Inhale 1 puff into the lungs 2 (two) times daily. 1 each 0  . furosemide (LASIX) 20 MG tablet Take 20 mg by mouth daily.     Marland Kitchen gabapentin (NEURONTIN) 300 MG capsule Take 300 mg by mouth 2 (two) times daily.    . metFORMIN (GLUCOPHAGE) 500 MG tablet Take 0.5 tablets by mouth 2 (two) times daily.    . rivaroxaban (XARELTO) 2.5 MG TABS tablet Take 1 tablet (2.5 mg total) by mouth 2 (two) times daily. 60 tablet 1  . rosuvastatin (CRESTOR) 40 MG tablet Take 40 mg by mouth daily.     No current facility-administered medications on file prior to visit.     Review of Systems  Constitution: Negative for decreased appetite, malaise/fatigue, weight gain and weight loss.  Eyes: Negative for visual disturbance.  Cardiovascular: Positive for claudication, dyspnea on exertion and leg swelling. Negative for chest pain, orthopnea, palpitations and syncope.  Respiratory: Positive for cough. Negative for hemoptysis and wheezing.    Endocrine: Negative for cold intolerance and heat intolerance.  Hematologic/Lymphatic: Does not bruise/bleed easily.  Skin: Negative for color change, nail changes and poor wound healing.  Musculoskeletal: Negative for muscle weakness and myalgias.  Gastrointestinal: Negative for abdominal pain, change in bowel habit, nausea and vomiting.  Neurological: Negative for difficulty with concentration, dizziness, focal weakness and headaches.  Psychiatric/Behavioral: Negative for altered mental status and suicidal ideas.  All other systems reviewed and are negative.     Objective  Blood pressure 106/71, pulse 69, height 5\' 7"  (1.702 m), weight 150 lb (68 kg), SpO2 96 %. Body mass index is 23.49 kg/m.    Physical Exam  Constitutional: He is oriented to person, place, and time. Vital signs are normal. He appears well-developed and well-nourished.  HENT:  Head: Normocephalic and atraumatic.  Neck: Normal range of motion. Neck supple.  Cardiovascular: Normal rate, regular rhythm, normal heart sounds and intact distal pulses.  Pulses:      Femoral pulses are 2+ on the right side and 1+ on the left side.      Popliteal pulses are 1+ on the right side and 1+ on the left side.       Dorsalis pedis pulses are 1+ on the right side and 1+ on the left side.       Posterior tibial pulses are 0 on the right side and 0 on the left side.  2+ pitting edema Warm bilateral LE DP 0.5 bilateral Popliteal 0.5   Pulmonary/Chest: Effort normal. No accessory muscle usage. No respiratory distress. He has wheezes. He has rhonchi.  Abdominal: Soft. Bowel sounds are normal.  Musculoskeletal: Normal range of motion.  Neurological: He is alert and oriented to person, place,  and time.  Skin: Skin is warm and dry.  Psychiatric: He has a normal mood and affect. His behavior is normal.  Vitals reviewed.  Radiology:  CT of abdomen and pelvis 12/04/2017:  1. No acute abnormality. 2. Mild diffuse hepatic steatosis.  3. Cholelithiasis. 4. Stable small right adrenal adenoma. 5. Small midline ventral hernia containing fat. 6. Atheromatous arterial calcifications, including the coronary arteries. 7. Interval 4.3 cm infrarenal abdominal aortic aneurysm at the location of the proximal portion of an aortobifemoral bypass graft. Recommend followup by ultrasound in 1 year. This recommendation follows ACR consensus guidelines: White Paper of the ACR Incidental Findings Committee II on Vascular Findings. J Am Coll Radiol 2013; 10:789-794. 8. 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 9. Small midline ventral hernia containing fat.  CTA of chest 09/21/2013: There is stable dilatation of the ascending thoracic aorta. The maximal diameter at the level of the sinuses of Valsalva is 4.3 cm. The proximal ascending thoracic aorta measures 4.2 cm. The proximal arch measures 3.8 cm. The distal arch measures 3.3 cm. The descending thoracic aorta measures 2.7 cm. The patient is status post CABG. There is no evidence of aortic dissection.  CTA of chest 2014: 1.  No evidence of pulmonary embolism. 2.  COPD/emphysema.  Scarring in the lower lobes, right middle lobe, and lingula.  No acute cardiopulmonary disease. 3.  Ectatic ascending thoracic aorta measuring 4.5 cm diameter, unchanged since February, 2012.  MRI/MRA of brain 08/23/2013:  1. Small but patent right vertebral artery to the level of the right PICA. 2. 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 There is flow within the posterior cerebral arteries bilaterally, likely via collateral circulation from the anterior circulation. Overall, these findings are not significantly changed relative to previous MRA from 10/10/2009. MRA: 1. Small but patent right vertebral artery to the level of the right PICA. 2. Occluded distal left vertebral artery with no flow seen within the basilar  artery. There is flow within the posterior cerebral arteries bilaterally, likely via collateral circulation from the anterior circulation. Overall, these findings are not significantly changed relative to previous MRA from 10/10/2009. 1. No acute intracranial infarct or other abnormality identified within the brain. 2. Age-related atrophy with moderate chronic microvascular ischemic disease involving the supratentorial white matter, advanced relative to prior MRI from 10/10/2009. 3. Remote pontine, left frontal lobe, right basal ganglia, and left cerebellar infarcts.  Laboratory examination:    CMP Latest Ref Rng & Units 05/22/2018 05/21/2018 04/25/2018  Glucose 70 - 99 mg/dL 209(H) 192(H) 119(H)  BUN 8 - 23 mg/dL 10 9 11   Creatinine 0.61 - 1.24 mg/dL 0.86 0.96 0.89  Sodium 135 - 145 mmol/L 138 140 140  Potassium 3.5 - 5.1 mmol/L 4.9 3.3(L) 3.4(L)  Chloride 98 - 111 mmol/L 104 99 101  CO2 22 - 32 mmol/L 25 23 28   Calcium 8.9 - 10.3 mg/dL 8.9 8.8(L) 8.9  Total Protein 6.5 - 8.1 g/dL - - -  Total Bilirubin 0.3 - 1.2 mg/dL - - -  Alkaline Phos 38 - 126 U/L - - -  AST 15 - 41 U/L - - -  ALT 0 - 44 U/L - - -   CBC Latest Ref Rng & Units 05/22/2018 05/21/2018 04/25/2018  WBC 4.0 - 10.5 K/uL 6.2 10.0 8.4  Hemoglobin 13.0 - 17.0 g/dL 15.1 16.8 16.3  Hematocrit 39.0 - 52.0 % 47.2 52.7(H) 51.2  Platelets 150 - 400  K/uL 213 201 185   Lipid Panel     Component Value Date/Time   CHOL 206 (H) 05/24/2015 1149   TRIG 120 05/24/2015 1149   HDL 80 05/24/2015 1149   CHOLHDL 2.6 05/24/2015 1149   VLDL 24 05/24/2015 1149   LDLCALC 102 05/24/2015 1149   HEMOGLOBIN A1C Lab Results  Component Value Date   HGBA1C 5.4 05/24/2015   MPG 108 05/24/2015   TSH No results for input(s): TSH in the last 8760 hours.  Cardiac Studies:   Carotid artery duplex  10/04/2018: Minimal stenosis in the right carotid bulb with heterogeneous plaque.  Stenosis in the left internal carotid artery (50-69%).  Antegrade right vertebral artery flow. Antegrade left vertebral artery flow.  Follow up in six months is appropriate if clinically indicated.  Abdominal Aortic Duplex  10/04/2018: Moderate dilatation of the abdominal aorta is noted in the mid and distal aorta, aortic aneurysm measuring 3.51 x 3.5 x 3.5 cm is seen.  Diffuse heterogeneous plaque noted. Iliac arteries not well visualized.  Recheck in 1 year.   [place] Stress Test 09/23/2018: Nondiagnostic ECG stress due to pharmacologic stress. Perfusion images reveal a moderate to large sized inferior and inferolateral transmural scar with minimal peri-infarct ischemia.  Gated SPECT images reveal inferior wall akinesis, LVEF mildly depressed at 41%. intermediate risk study.  Lower Extremity Arterial Duplex 09/22/2018: Right lower extremity demonstrates diffuse mixed plaque throughout. There is moderately abnormal waveform throughout the right lower extremity suggestive of moderate to severe diffuse disease. Right PFA has >50% stenosis, probably > 75%  stenosis. Cannot exclude inflow disease (right CIA).  lLeft ower extremity demonstrates diffuse mixed plaque throughout.  Left CIA stenosis of <50%. Distal left SFA >50% stenosis.  Moderately abnormal PVR waveforms of the right ankle. Moderately abnormal PVR waveforms of the left ankle.  Mildly decreased right resting ABI at 0.85 and m moderately decreased left resting ABI 0.77.   Echocardiogram 09/22/2018: Moderately depressed LV systolic function with EF 40%. Left ventricle cavity is normal in size. Mild concentric hypertrophy of the left ventricle. Mild global hypokinesis. Abnormal septal wall motion due to post-operative coronary artery bypass graft. Doppler evidence of grade I (impaired) diastolic dysfunction, normal LAP. Calculated EF 40%. Mild (Grade I) aortic regurgitation. The aortic root is mildly dilated at 4.0 cm.  Assessment     ICD-10-CM   1. PAD (peripheral artery  disease) (HCC)  I73.9   2. Acute on chronic combined systolic and diastolic CHF (congestive heart failure) (HCC)  I50.43   3. 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 Carotid stenosis, bilateral  I65.23   5. S/P AAA repair  Z98.890    Z86.79     EKG 05/21/2018: Sinus tachycardia at 119 bpm, left axis deviation, poor R wave progression.   Recommendations:   JAJA SWITALSKI  is a 68 y.o. male  with COPD, CAD s/p CABG by Dr. Roxan Hockey in 2012 (patent LIMA-LAD, patent SVG-ramus, known occluded SVG-diagonal, and SVG-RCA as of 2015), HTN, HLD, Hx CVA, PAD, AAA s/p repair in 2011 using Dacron graft with aortobifemoral graft goal-2011, and former tobacco use.  Patient has exertional leg weakness, does have burning rest pain. Lower extremities are warm today. I have discussed recently obtained test results with the patient, as he does not have critical limb ischemia on exam today, I feel that his best option would be to pursue medical management. Continue with aspirin and low dose Xarelto.  Patient does have moderately depressed LVEF by recent echocardiogram that is likely chronic from previous CABG.  He had minimal peri-infarct on recent Lexiscan nuclear stress test, would recommend continued aggressive medical management.  Patient is presently in acute on chronic systolic and diastolic heart failure exacerbation as noted to have significant leg swelling and reports frequent productive cough.  He is presently on Lasix 20 mg daily, do not feel that oral Lasix will likely be able to achieve aggressive enough diuresis.  Feel that patient would best be suited for a hospital admission for aggressive IV diuresis.  Patient is agreeable to this.  We will arrange for direct hospital admission.  He does have abdominal aortic aneurysm measuring 3.1 cm by recent abdominal duplex; however, was noted to be 4.3 cm by last CT of abdomen. Can consider CTA  of abdomen at some point, but suspect that this has actually decreased in size.  He also has aortic root dilation stable at 4.0 cm and will need annual surveillance. He also has 50-69% stenosis in left ICA and will repeat duplex in 6 months. We will see him back after hospitalization for follow up.   *I have discussed this case with Dr. Virgina Jock and he personally examined the patient and participated in formulating the plan.*   Miquel Dunn, MSN, APRN, FNP-C [facility]. [place] Office: [phone] Fax: [phone]

## 2018-10-18 ENCOUNTER — Inpatient Hospital Stay (HOSPITAL_COMMUNITY): Payer: Medicare Other

## 2018-10-18 DIAGNOSIS — I25708 Atherosclerosis of coronary artery bypass graft(s), unspecified, with other forms of angina pectoris: Secondary | ICD-10-CM

## 2018-10-18 DIAGNOSIS — I739 Peripheral vascular disease, unspecified: Secondary | ICD-10-CM

## 2018-10-18 DIAGNOSIS — I5023 Acute on chronic systolic (congestive) heart failure: Secondary | ICD-10-CM

## 2018-10-18 LAB — GLUCOSE, CAPILLARY
Glucose-Capillary: 114 mg/dL — ABNORMAL HIGH (ref 70–99)
Glucose-Capillary: 166 mg/dL — ABNORMAL HIGH (ref 70–99)
Glucose-Capillary: 156 mg/dL — ABNORMAL HIGH (ref 70–99)

## 2018-10-18 LAB — BASIC METABOLIC PANEL
Anion gap: 10 (ref 5–15)
BUN: 14 mg/dL (ref 8–23)
CO2: 29 mmol/L (ref 22–32)
Calcium: 9 mg/dL (ref 8.9–10.3)
Chloride: 101 mmol/L (ref 98–111)
Creatinine, Ser: 1.02 mg/dL (ref 0.61–1.24)
GFR calc Af Amer: >60 mL/min (ref >60)
GFR calc non Af Amer: >60 mL/min (ref >60)
Glucose, Bld: 114 mg/dL — ABNORMAL HIGH (ref 70–99)
Potassium: 3 mmol/L — ABNORMAL LOW (ref 3.5–5.1)
Sodium: 140 mmol/L (ref 135–145)

## 2018-10-18 MED ORDER — POTASSIUM CHLORIDE CRYS ER 20 MEQ PO TBCR
40.0000 meq | EXTENDED_RELEASE_TABLET | ORAL | Status: AC
  Administered 2018-10-18 (×2): 40 meq via ORAL
  Filled 2018-10-18 (×2): qty 2

## 2018-10-18 MED ORDER — SACUBITRIL-VALSARTAN 24-26 MG PO TABS
1.0000 | ORAL_TABLET | Freq: Two times a day (BID) | ORAL | Status: DC
  Administered 2018-10-18 – 2018-10-19 (×3): 1 via ORAL
  Filled 2018-10-18 (×3): qty 1

## 2018-10-18 MED ORDER — SACUBITRIL-VALSARTAN 24-26 MG PO TABS: 1.0000 | ORAL_TABLET | Freq: Two times a day (BID) | ORAL | 1 refills | Status: DC

## 2018-10-18 MED FILL — ENTRESTO 24 MG-26 MG TABLET: 24-26 | 30 days supply | Qty: 60 | Fill #0

## 2018-10-18 NOTE — Progress Notes (Addendum)
Subjective:  Feels better.   Objective:  Vital Signs in the last 24 hours: Temp:  [97.4 F (36.3 C)-99.1 F (37.3 C)] 98 F (36.7 C) (06/23 0729) Pulse Rate:  [51-74] 51 (06/23 0729) Resp:  [18] 18 (06/23 0729) BP: (99-128)/(69-77) 124/72 (06/23 0729) SpO2:  [93 %-96 %] 96 % (06/23 0729) Weight:  [68 kg-78.9 kg] 77.8 kg (06/23 0331)  Intake/Output from previous day: 06/22 0701 - 06/23 0700 In: 340 [P.O.:340] Out: 1400 [Urine:1400]  Physical Exam  Constitutional: He is oriented to person, place, and time. He appears well-developed and well-nourished. No distress.  HENT:  Head: Normocephalic and atraumatic.  Eyes: Pupils are equal, round, and reactive to light. Conjunctivae are normal.  Neck: No JVD present.  Cardiovascular: Normal rate and regular rhythm.  Pulses:      Dorsalis pedis pulses are 1+ on the right side and 1+ on the left side.       Posterior tibial pulses are 0 on the right side and 0 on the left side.  Pulmonary/Chest: Effort normal and breath sounds normal. He has no wheezes. He has no rales.  Sternotomy scar  Abdominal: Soft. Bowel sounds are normal. There is no rebound.  Musculoskeletal:        General: Edema (1+ b/l) present.  Lymphadenopathy:    He has no cervical adenopathy.  Neurological: He is alert and oriented to person, place, and time. No cranial nerve deficit.  Skin: Skin is warm and dry.  Psychiatric: He has a normal mood and affect.  Nursing note and vitals reviewed.    Lab Results: BMP Recent Labs    05/22/18 0543 10/17/18 1701 10/18/18 0521  NA 138 137 140  K 4.9 3.4* 3.0*  CL 104 101 101  CO2 25 26 29   GLUCOSE 209* 125* 114*  BUN 10 14 14   CREATININE 0.86 1.14 1.02  CALCIUM 8.9 9.3 9.0  GFRNONAA >60 >60 >60  GFRAA >60 >60 >60    CBC Recent Labs  Lab 10/17/18 1701  WBC 9.6  RBC 5.08  HGB 16.7  HCT 50.8  PLT 181  MCV 100.0  MCH 32.9  MCHC 32.9  RDW 12.0  LYMPHSABS 1.9  MONOABS 0.8  EOSABS 0.2  BASOSABS 0.0     HEMOGLOBIN A1C Lab Results  Component Value Date   HGBA1C 5.4 05/24/2015   MPG 108 05/24/2015    Cardiac Panel (last 3 results) No results for input(s): CKTOTAL, CKMB, TROPONINI, RELINDX in the last 8760 hours.  BNP (last 3 results) Recent Labs    12/30/17 1029 10/17/18 1718  BNP 18.4 16.4    Lipid Panel     Component Value Date/Time   CHOL 206 (H) 05/24/2015 1149   TRIG 120 05/24/2015 1149   HDL 80 05/24/2015 1149   CHOLHDL 2.6 05/24/2015 1149   VLDL 24 05/24/2015 1149   LDLCALC 102 05/24/2015 1149     Hepatic Function Panel Recent Labs    12/04/17 0933 12/30/17 1029 10/17/18 1701  PROT 6.8 7.3 6.7  ALBUMIN 3.5 3.8 3.7  AST 15 18 17   ALT 13 14 15   ALKPHOS 80 87 93  BILITOT 0.7 0.6 0.7    Cardiac Studies:   Carotid artery duplex 10/24/18:  Minimal stenosis in the right carotid bulb with heterogeneous plaque.  Stenosis in the left internal carotid artery (50-69%). Antegrade right vertebral artery flow. Antegrade left vertebral artery flow.  Follow up in six months is appropriate if clinically indicated.   Abdominal Aortic Duplex  10/04/2018:  Moderate dilatation of the abdominal aorta is noted in the mid and distal aorta, aortic aneurysm measuring 3.51 x 3.5 x 3.5 cm is seen. Diffuse heterogeneous plaque noted.  Iliac arteries not well visualized.  Recheck in 1 year.   [place] Stress Test 09/23/2018:  Nondiagnostic ECG stress due to pharmacologic stress.  Perfusion images reveal a moderate to large sized inferior and inferolateral transmural scar with minimal peri-infarct ischemia. Gated SPECT images reveal inferior wall akinesis, LVEF mildly depressed at 41%. intermediate risk study.   Lower Extremity Arterial Duplex 09/22/2018:  Right lower extremity demonstrates diffuse mixed plaque throughout. There is moderately abnormal waveform throughout the right lower extremity suggestive of moderate to severe diffuse disease. Right PFA has  >50% stenosis, probably > 75% stenosis. Cannot exclude inflow disease (right CIA).  lLeft ower extremity demonstrates diffuse mixed plaque throughout. Left CIA stenosis of <50%. Distal left SFA >50% stenosis.  Moderately abnormal PVR waveforms of the right ankle. Moderately abnormal PVR waveforms of the left ankle.  Mildly decreased right resting ABI at 0.85 and m moderately decreased left resting ABI 0.77.   Echocardiogram 09/22/2018:  Moderately depressed LV systolic function with EF 40%. Left ventricle cavity is normal in size. Mild concentric hypertrophy of the left ventricle. Mild global hypokinesis. Abnormal septal wall motion due to post-operative coronary artery bypass graft. Doppler evidence of grade I (impaired) diastolic dysfunction, normal LAP. Calculated EF 40%.  Mild (Grade I) aortic regurgitation.  The aortic root is mildly dilated at 4.0 cm.   Assessment & Recommendations:  68 y.o. Caucasian male with COPD, CAD s/p CABG by Dr. Roxan Hockey in 2012 (patent LIMA-LAD, patent SVG-ramus, known occluded SVG-diagonal, and SVG-RCA as of 2015), HTN, HLD, Hx CVA, PAD, AAA s/p repair in 2011 using Dacron graft with aortobifemoral graft,  aortic root dilation and former tobacco use, now admitted with acute on chronic systolic heart failure EF 40%. Admitted for IV diuresis.  Acute on chronic systolic heart failure: Although BNP is normal, clinically appeared to be in mild heart failure exacerbation. Diuresing well. Still has 1+ edema.  Will start Entresto 24-26 mg bid. This will help in diuresis. Also, inpatient monitoring will help assess his tolerance to [facility], given his low normal blood pressures. Will repeat BMP in am. Resting bradycardia, thus not on beta blockers.  Likely discharge home 06/24.   CAD s/p CABG: No angina symptoms. Continue Aspirin, statin.  Will resume Xarelto 2.5 mg bid on discharge.   DM: Metformin on discharge.   PAD: Severe claudication. No critical limb  ischemia. Continue medical management with Aspirin, statin, BP and diabetes control.  Encourage regular walking. If no improvement in 3 months, could consider peripheral angiography.  Will resume Xarelto 2.5 mg bid on discharge.  AAA: S/p repair. Outpatient aorta duplex follow up in 1 year.   Nigel Mormon, M.D. 10/18/2018, 10:20 AM [facility] Pager: [phone] Office: [phone] If no answer: [phone]

## 2018-10-18 NOTE — Evaluation (Signed)
Physical Therapy Evaluation Patient Details Name: Chris Payne MRN: [id number] DOB: 1951-01-28 Today's Date: 10/18/2018   History of Present Illness  Patient is a 68 y/o male with primary complaints of exertional leg weakness - admitted for acute on chronic systolic heart failure. PMH significant of COPD, CAD s/p CABG, HTN, HLD, Hx CVA, PAD, AAA s/p repair in 2011.    Clinical Impression  Patient admitted with the above. Patient reports requiring assistance for ADLs prior to admission, as well as use of rollator for limited household distances. Patient today requiring general min guard for functional mobility with use of RW. Discussion of HHPT at discharge with PT and patient in agreement. PT to follow acutely to maximize safe and independent functional mobility prior to return home.      Follow Up Recommendations Home health PT;Supervision for mobility/OOB    Equipment Recommendations  None recommended by PT    Recommendations for Other Services       Precautions / Restrictions Precautions Precautions: Fall Restrictions Weight Bearing Restrictions: No      Mobility  Bed Mobility               General bed mobility comments: up in recliner  Transfers Overall transfer level: Needs assistance Equipment used: Rolling walker (2 wheeled) Transfers: Sit to/from [facility] Sit to Stand: Min guard Stand pivot transfers: Min guard       General transfer comment: min guard for safety and immediate standing balance  Ambulation/Gait Ambulation/Gait assistance: Min guard Gait Distance (Feet): 100 Feet Assistive device: Rolling walker (2 wheeled) Gait Pattern/deviations: Step-through pattern;Decreased stride length;Trunk flexed Gait velocity: decreased   General Gait Details: heavy trunk flexion with limited step length; cueing to remain close to RW for safety; tends to reach for objects when in close proximity  Stairs            Wheelchair  Mobility    Modified Rankin (Stroke Patients Only)       Balance Overall balance assessment: Mild deficits observed, not formally tested                                           Pertinent Vitals/Pain Pain Assessment: No/denies pain    Home Living Family/patient expects to be discharged to:: Private residence Living Arrangements: Spouse/significant other;Other relatives;Non-relatives/Friends Available Help at Discharge: Family;Friend(s);Available 24 hours/day Type of Home: House Home Access: Stairs to enter   [employer] of Steps: 1 Home Layout: Two level;Able to live on main level with bedroom/bathroom Home Equipment: Walker - 4 wheels      Prior Function Level of Independence: Needs assistance   Gait / Transfers Assistance Needed: use of rollator for mobility  ADL's / Homemaking Assistance Needed: girlfriend assists with bathing, dressing, meal prep        Hand Dominance        Extremity/Trunk Assessment   Upper Extremity Assessment Upper Extremity Assessment: Defer to OT evaluation    Lower Extremity Assessment Lower Extremity Assessment: Generalized weakness    Cervical / Trunk Assessment Cervical / Trunk Assessment: Kyphotic  Communication   Communication: No difficulties  Cognition Arousal/Alertness: Awake/alert Behavior During Therapy: WFL for tasks assessed/performed Overall Cognitive Status: Within Functional Limits for tasks assessed  General Comments General comments (skin integrity, edema, etc.): reports his mobility is near baseline    Exercises     Assessment/Plan    PT Assessment Patient needs continued PT services  PT Problem List Decreased strength;Decreased activity tolerance;Decreased balance;Decreased mobility;Decreased knowledge of use of DME;Decreased safety awareness       PT Treatment Interventions DME instruction;Gait training;Stair  training;Functional mobility training;Therapeutic activities;Therapeutic exercise;Balance training;Patient/family education    PT Goals (Current goals can be found in the Care Plan section)  Acute Rehab PT Goals Patient Stated Goal: regain strength in my legs PT Goal Formulation: With patient Time For Goal Achievement: 11/01/18 Potential to Achieve Goals: Good    Frequency Min 3X/week   Barriers to discharge        Co-evaluation               AM-PAC PT "6 Clicks" Mobility  Outcome Measure Help needed turning from your back to your side while in a flat bed without using bedrails?: A Little Help needed moving from lying on your back to sitting on the side of a flat bed without using bedrails?: A Little Help needed moving to and from a bed to a chair (including a wheelchair)?: A Little Help needed standing up from a chair using your arms (e.g., wheelchair or bedside chair)?: A Little Help needed to walk in hospital room?: A Little Help needed climbing 3-5 steps with a railing? : A Lot 6 Click Score: 17    End of Session Equipment Utilized During Treatment: Gait belt Activity Tolerance: Patient tolerated treatment well Patient left: in chair;with call bell/phone within reach;with chair alarm set Nurse Communication: Mobility status PT Visit Diagnosis: Unsteadiness on feet (R26.81);Other abnormalities of gait and mobility (R26.89);Muscle weakness (generalized) (M62.81)    Time: 8264-1583 PT Time Calculation (min) (ACUTE ONLY): 12 min   Charges:   PT Evaluation $PT Eval Moderate Complexity: 1 Mod           Lanney Gins, PT, DPT Supplemental Physical Therapist 10/18/18 12:58 PM Pager: [phone] Office: [phone]

## 2018-10-19 DIAGNOSIS — B3789 Other sites of candidiasis: Secondary | ICD-10-CM

## 2018-10-19 LAB — BASIC METABOLIC PANEL
Anion gap: 7 (ref 5–15)
BUN: 15 mg/dL (ref 8–23)
CO2: 31 mmol/L (ref 22–32)
Calcium: 9.1 mg/dL (ref 8.9–10.3)
Chloride: 104 mmol/L (ref 98–111)
Creatinine, Ser: 1.17 mg/dL (ref 0.61–1.24)
GFR calc Af Amer: >60 mL/min (ref >60)
GFR calc non Af Amer: >60 mL/min (ref >60)
Glucose, Bld: 115 mg/dL — ABNORMAL HIGH (ref 70–99)
Potassium: 3.7 mmol/L (ref 3.5–5.1)
Sodium: 142 mmol/L (ref 135–145)

## 2018-10-19 LAB — NOVEL CORONAVIRUS, NAA (HOSP ORDER, SEND-OUT TO REF LAB; TAT 18-24 HRS): SARS-CoV-2, NAA: NOT DETECTED

## 2018-10-19 LAB — GLUCOSE, CAPILLARY: Glucose-Capillary: 116 mg/dL — ABNORMAL HIGH (ref 70–99)

## 2018-10-19 MED ORDER — NYSTATIN 100000 UNIT/GM EX POWD: Freq: Three times a day (TID) | CUTANEOUS | 0 refills | Status: DC

## 2018-10-19 MED FILL — NYSTATIN 100000 UNIT/GM POW: 100000 | 5 days supply | Qty: 15 | Fill #0

## 2018-10-19 NOTE — Consult Note (Signed)
   [facility] Inpatient Consult   10/19/2018  RAYLYN SPECKMAN 17-Nov-1950 [id number]  Patient was assessed for [place] [facility] for community services. Patient was previously active with [facility]. Spoke with patient via phone in hospital room regarding being restarted with [facility] services.  HIPAA verified. Patient states he would like for post hospital follow up with [place] [facility].   Primary Care Provider: Benito Mccreedy, MD is confirmed. Pharmacy: [facility], denies any issues with obtaining or taking his medications.  There is a written consent form signed and on file.   Patient will have [place] follow up for CHF management.    Of note, [facility] Care Management services does not replace or interfere with any services that are arranged by inpatient case management or social work.   For additional questions or referrals please contact:  Natividad Brood, RN BSN [facility] Liaison  [phone] business mobile phone Toll free office [phone]  Fax number: [phone] [email or web address]

## 2018-10-19 NOTE — Discharge Summary (Signed)
Physician Discharge Summary  Patient ID: Chris Payne MRN: [id number] DOB/AGE: 10-13-1950 68 y.o.  Admit date: 10/17/2018 Discharge date: 10/19/2018  Primary Discharge Diagnosis: Acute on chronic sysotlic heart failure CAD s/p CABG PAD  Aortic aneurysm s/p repair and aortobifem graft    Secondary Discharge Diagnosis: Same   Hospital Course:   68 y.o. Caucasian male with COPD, CAD s/p CABG by Dr. Roxan Hockey in 2012 (patent LIMA-LAD, patent SVG-ramus, known occluded SVG-diagonal, and SVG-RCA as of 2015), HTN, HLD, Hx CVA, PAD, AAA s/p repair in 2011 using Dacron graft with aortobifemoral graft,  aortic root dilation and former tobacco use, was admitted from office due to bilateral pedal edema, cough consistent with acute on chronic systolic heart failure EF 40%  Patient diuresed well. He was started on Entresto, which was tolerated. He ambulated without shortness of breath.   Beta blocker was not started due to resting bradycardia. Will follow up outpatient.   Recommend medical therapy, including low dose Xarelto for PAD with claudication.  He was prescribed nystatin powder for groin rash.  Discharge Exam: Blood pressure 103/66, pulse 63, temperature 98.3 F (36.8 C), temperature source Oral, resp. rate 18, height 5\' 7"  (1.702 m), weight 78.7 kg, SpO2 97 %.   Constitutional: He is oriented to person, place, and time. He appears well-developed and well-nourished. No distress.  HENT:  Head: Normocephalic and atraumatic.  Eyes: Pupils are equal, round, and reactive to light. Conjunctivae are normal.  Neck: No JVD present.  Cardiovascular: Normal rate and regular rhythm.  Pulses:      Dorsalis pedis pulses are 1+ on the right side and 1+ on the left side.       Posterior tibial pulses are 0 on the right side and 0 on the left side.  Pulmonary/Chest: Effort normal and breath sounds normal. He has no wheezes. He has no rales.  Sternotomy scar  Abdominal: Soft. Bowel sounds are  normal. There is no rebound.  Musculoskeletal:        General: Edema (1+ b/l) present.  Lymphadenopathy:    He has no cervical adenopathy.  Neurological: He is alert and oriented to person, place, and time. No cranial nerve deficit.  Skin: Skin is warm and dry.  Psychiatric: He has a normal mood and affect.  Nursing note and vitals reviewed.   Significant Diagnostic Studies:  Cardiac Studies:  Carotid artery duplex 10/04/2018:  Minimal stenosis in the right carotid bulb with heterogeneous plaque.  Stenosis in the left internal carotid artery (50-69%). Antegrade right vertebral artery flow. Antegrade left vertebral artery flow.  Follow up in six months is appropriate if clinically indicated.   Abdominal Aortic Duplex 10/04/2018:  Moderate dilatation of the abdominal aorta is noted in the mid and distal aorta, aortic aneurysm measuring 3.51 x 3.5 x 3.5 cm is seen. Diffuse heterogeneous plaque noted.  Iliac arteries not well visualized.  Recheck in 1 year.   [place] Stress Test 09/23/2018:  Nondiagnostic ECG stress due to pharmacologic stress.  Perfusion images reveal a moderate to large sized inferior and inferolateral transmural scar with minimal peri-infarct ischemia. Gated SPECT images reveal inferior wall akinesis, LVEF mildly depressed at 41%. intermediate risk study.   Lower Extremity Arterial Duplex 09/22/2018:  Right lower extremity demonstrates diffuse mixed plaque throughout. There is moderately abnormal waveform throughout the right lower extremity suggestive of moderate to severe diffuse disease. Right PFA has >50% stenosis, probably >75% stenosis. Cannot exclude inflow disease (right CIA).  lLeft ower extremity demonstrates diffuse  mixed plaque throughout. Left CIA stenosis of <50%. Distal left SFA >50% stenosis.  Moderately abnormal PVR waveforms of the right ankle. Moderately abnormal PVR waveforms of the left ankle.  Mildly decreased right resting ABI at  0.85 and m moderately decreased left resting ABI 0.77.   Echocardiogram 09/22/2018:  Moderately depressed LV systolic function with EF 40%. Left ventricle cavity is normal in size. Mild concentric hypertrophy of the left ventricle. Mild global hypokinesis. Abnormal septal wall motion due to post-operative coronary artery bypass graft. Doppler evidence of grade I (impaired) diastolic dysfunction, normal LAP. Calculated EF 40%.  Mild (Grade I) aortic regurgitation.  The aortic root is mildly dilated at 4.0 cm.   Labs:   Lab Results  Component Value Date   WBC 9.6 10/17/2018   HGB 16.7 10/17/2018   HCT 50.8 10/17/2018   MCV 100.0 10/17/2018   PLT 181 10/17/2018    Recent Labs  Lab 10/17/18 1701  10/19/18 0636  NA 137   < > 142  K 3.4*   < > 3.7  CL 101   < > 104  CO2 26   < > 31  BUN 14   < > 15  CREATININE 1.14   < > 1.17  CALCIUM 9.3   < > 9.1  PROT 6.7  --   --   BILITOT 0.7  --   --   ALKPHOS 93  --   --   ALT 15  --   --   AST 17  --   --   GLUCOSE 125*   < > 115*   < > = values in this interval not displayed.    Lipid Panel     Component Value Date/Time   CHOL 206 (H) 05/24/2015 1149   TRIG 120 05/24/2015 1149   HDL 80 05/24/2015 1149   CHOLHDL 2.6 05/24/2015 1149   VLDL 24 05/24/2015 1149   LDLCALC 102 05/24/2015 1149    BNP (last 3 results) Recent Labs    12/30/17 1029 10/17/18 1718  BNP 18.4 16.4    HEMOGLOBIN A1C Lab Results  Component Value Date   HGBA1C 5.4 05/24/2015   MPG 108 05/24/2015    Cardiac Panel (last 3 results) No results for input(s): CKTOTAL, CKMB, TROPONINI, RELINDX in the last 8760 hours.  Lab Results  Component Value Date   CKTOTAL 26 09/23/2013   CKMB 0.6 06/21/2010   TROPONINI <0.03 09/26/2017     TSH No results for input(s): TSH in the last 8760 hours.  Radiology: Dg Chest 2 View  Result Date: 10/18/2018 CLINICAL DATA:  CHF.  Leg swelling.  Shortness of breath.  Cough. EXAM: CHEST - 2 VIEW COMPARISON:  Chest  x-ray 05/21/2018. FINDINGS: Prior CABG. Stable cardiomegaly. No pulmonary venous congestion. Stable left base subsegmental atelectasis and or scarring. Tiny right pleural effusion versus pleural scarring noted. No pneumothorax. IMPRESSION: 1. Prior CABG. Stable cardiomegaly. No pulmonary venous congestion. 2. Mild left base subsegmental atelectasis. Tiny right pleural effusion versus pleural scarring noted. Electronically Signed   By: Marcello Moores  Register   On: 10/18/2018 09:26   Pcv Carotid Duplex (bilateral)  Result Date: 10/09/2018 Carotid artery duplex  10/04/2018: Minimal stenosis in the right carotid bulb with heterogeneous plaque. Stenosis in the left internal carotid artery (50-69%). Antegrade right vertebral artery flow. Antegrade left vertebral artery flow. Follow up in six months is appropriate if clinically indicated.  Pcv Lower Arterial (bilateral)  Result Date: 09/25/2018 Lower Extremity Arterial Duplex 09/22/2018: Right lower  extremity demonstrates diffuse mixed plaque throughout. There is moderately abnormal waveform throughout the right lower extremity suggestive of moderate to severe diffuse disease. Right PFA has >50% stenosis, probably > 75%  stenosis. Cannot exclude inflow disease (right CIA). lLeft ower extremity demonstrates diffuse mixed plaque throughout.  Left CIA stenosis of <50%. Distal left SFA >50% stenosis. Moderately abnormal PVR waveforms of the right ankle. Moderately abnormal PVR waveforms of the left ankle. Mildly decreased right resting ABI at 0.85 and m moderately decreased left resting ABI 0.77.   Pcv Aorta Duplex  Result Date: 10/09/2018 Abdominal Aortic Duplex  10/04/2018: Moderate dilatation of the abdominal aorta is noted in the mid and distal aorta, aortic aneurysm measuring 3.51 x 3.5 x 3.5 cm is seen.  Diffuse heterogeneous plaque noted. Iliac arteries not well visualized. Recheck in 1 year.      FOLLOW UP PLANS AND APPOINTMENTS Discharge Instructions     Diet - low sodium heart healthy   Complete by: As directed    Increase activity slowly   Complete by: As directed      Allergies as of 10/19/2018   No Known Allergies     Medication List    TAKE these medications   albuterol 108 (90 Base) MCG/ACT inhaler Commonly known as: VENTOLIN HFA Inhale 2 puffs into the lungs every 6 (six) hours as needed for wheezing or shortness of breath.   aspirin 81 MG EC tablet Take 1 tablet (81 mg total) by mouth daily.   budesonide-formoterol 80-4.5 MCG/ACT inhaler Commonly known as: SYMBICORT Inhale 2 puffs into the lungs 2 (two) times daily.   Fluticasone-Salmeterol 250-50 MCG/DOSE Aepb Commonly known as: Advair Diskus Inhale 1 puff into the lungs 2 (two) times daily.   furosemide 20 MG tablet Commonly known as: LASIX Take 20 mg by mouth daily.   gabapentin 300 MG capsule Commonly known as: NEURONTIN Take 300 mg by mouth 2 (two) times daily.   metFORMIN 500 MG tablet Commonly known as: GLUCOPHAGE Take 0.5 tablets by mouth 2 (two) times daily.   nystatin powder Commonly known as: MYCOSTATIN/NYSTOP Apply topically 3 (three) times daily.   rivaroxaban 2.5 MG Tabs tablet Commonly known as: Xarelto Take 1 tablet (2.5 mg total) by mouth 2 (two) times daily.   rosuvastatin 40 MG tablet Commonly known as: CRESTOR Take 40 mg by mouth daily.   sacubitril-valsartan 24-26 MG Commonly known as: ENTRESTO Take 1 tablet by mouth 2 (two) times daily.      Follow-up Information    Miquel [facility], NP Follow up on 10/26/2018.   Specialty: Cardiology Why: 1:30 PM Contact information: [place] [address] [phone]        Benito Mccreedy, MD.   Specialty: Internal Medicine Why: GO: July 9th at [facility] information: [place] [address] [place] [address] [phone]            Vernell Leep MD, [facility] Pager: [phone] Office: [phone] If  no answer: [phone]

## 2018-10-21 ENCOUNTER — Other Ambulatory Visit: Payer: Self-pay | Admitting: *Deleted

## 2018-10-21 DIAGNOSIS — J441 Chronic obstructive pulmonary disease with (acute) exacerbation: Secondary | ICD-10-CM

## 2018-10-21 NOTE — Patient Outreach (Signed)
Coverage for primary care RN CM Tomasa Rand during her absence, referral received 10/19/18 from hospital liason, pt hospitalized 6/22-6/24/20 for CHF exacerbation, pt with history COPD, chronic pain, PAD, DM 2, HTN, Hx AAA repair,  Spoke with pt for transition of care week 1, HIPAA verified, pt states he has assistance in the home of his girlfriend and her sister and they assist with any aspects of his care that is needed,  Pt verbalizes understanding of post hospital appointments that have been scheduled,  Pt states " I've been out of my inhalers for over a week now, the prescriptions have no refills"  (See care plan- MD notified and order for [facility] pharmacist placed)  Pt interested in learning more about low sodium diet and requests information,  RN CM faxed today's note and barrier letter to primary care MD office.  Outpatient Encounter Medications as of 10/21/2018  Medication Sig Note  . aspirin EC 81 MG EC tablet Take 1 tablet (81 mg total) by mouth daily.   . furosemide (LASIX) 20 MG tablet Take 20 mg by mouth daily.    Marland Kitchen gabapentin (NEURONTIN) 300 MG capsule Take 300 mg by mouth 2 (two) times daily.   . metFORMIN (GLUCOPHAGE) 500 MG tablet Take 0.5 tablets by mouth 2 (two) times daily.   Marland Kitchen nystatin (MYCOSTATIN/NYSTOP) powder Apply topically 3 (three) times daily.   . rivaroxaban (XARELTO) 2.5 MG TABS tablet Take 1 tablet (2.5 mg total) by mouth 2 (two) times daily. 10/17/2018: AM 0900 PM 1500  . rosuvastatin (CRESTOR) 40 MG tablet Take 40 mg by mouth daily.   . sacubitril-valsartan (ENTRESTO) 24-26 MG Take 1 tablet by mouth 2 (two) times daily.   Marland Kitchen albuterol (PROVENTIL HFA;VENTOLIN HFA) 108 (90 Base) MCG/ACT inhaler Inhale 2 puffs into the lungs every 6 (six) hours as needed for wheezing or shortness of breath. (Patient not taking: Reported on 10/21/2018) 10/17/2018: Pt ran out a couple of day ago.   . budesonide-formoterol (SYMBICORT) 80-4.5 MCG/ACT inhaler Inhale 2 puffs into the lungs 2 (two) times  daily. 10/17/2018: Pt ran out a couple of days ago  . Fluticasone-Salmeterol (ADVAIR DISKUS) 250-50 MCG/DOSE AEPB Inhale 1 puff into the lungs 2 (two) times daily. (Patient not taking: Reported on 10/21/2018) 10/17/2018: Pt ran out a couple of days ago.   No facility-administered encounter medications on file as of 10/21/2018.     THN CM Care Plan Problem One     Most Recent Value  Care Plan Problem One  Knowledge deficit related to CHF  Role Documenting the Problem One  Care Management Coordinator  Care Plan for Problem One  Active  THN Long Term Goal   Pt will verbalize/ demonstrate improved self care for CHF within 60 days  THN Long Term Goal Start Date  10/21/18  Interventions for Problem One Long Term Goal  RN CM explained [facility] program, completed transition of care, reviewed plan of care, mailed successful outreach letter to pt home with consent form, 24 hour nurse line magnet, pamphet, low sodium poster  THN CM Short Term Goal #1   Pt will verbalize CHF action plan/ zones within 30 days  THN CM Short Term Goal #1 Start Date  10/21/18  Interventions for Short Term Goal #1  RN CM explained HF action plan with emphasis on yellow zone,  pt states he is green zone today, mailed pt copy of HF action plan  THN CM Short Term Goal #2   Pt will verbalize understanding of low  sodium diet within 30 days  THN CM Short Term Goal #2 Start Date  10/21/18  Interventions for Short Term Goal #2  RN CM discussed low sodium diet with pt,  mailed low sodium poster to pt home    [name] CM Care Plan Problem Two     Most Recent Value  Care Plan Problem Two  Knowledge deficit related to COPD  Role Documenting the Problem Two  Care Management Coordinator  Care Plan for Problem Two  Active  THN CM Short Term Goal #1   Pt will verbalize COPD action plan and improved self care for COPD within 30 days  THN CM Short Term Goal #1 Start Date  10/21/18  Interventions for Short Term Goal #2   RN CM reviewed COPD action plan,  pt does not have his inhalers, (states prescription expired) RN CM placed order for [facility] pharmacist for assistance, faxed note to primary MD requesting prescriptions be sent to pt pharmacy, reviewed with pt importance of not letting prescriptions expire and using inhalers as prescribed      PLAN Continue weekly transition of care Collaborate with [facility] pharmacist as needed Make sure pt has inhalers Reinforce CHF/ COPD action plan Assess weight  Jacqlyn Larsen [facility], [place] Coordinator [phone]

## 2018-10-25 ENCOUNTER — Other Ambulatory Visit: Payer: Self-pay | Admitting: Pharmacist

## 2018-10-25 ENCOUNTER — Other Ambulatory Visit: Payer: Self-pay | Admitting: Cardiology

## 2018-10-25 ENCOUNTER — Other Ambulatory Visit: Payer: Self-pay

## 2018-10-25 DIAGNOSIS — I739 Peripheral vascular disease, unspecified: Secondary | ICD-10-CM

## 2018-10-25 DIAGNOSIS — I25728 Atherosclerosis of autologous artery coronary artery bypass graft(s) with other forms of angina pectoris: Secondary | ICD-10-CM

## 2018-10-25 MED ORDER — SACUBITRIL-VALSARTAN 24-26 MG PO TABS: 1.0000 | ORAL_TABLET | Freq: Two times a day (BID) | ORAL | 1 refills | Status: DC

## 2018-10-25 NOTE — Patient Outreach (Signed)
[facility]) Care Management  [place]   10/25/2018  KEENAN TREFRY 1950-05-14 [id number]  Reason for referral: Medication Assistance with Advair, Albuterol, Symbicort  Referral source: [facility] Inpatient Liaison Current insurance: [facility]  PMHx includes but not limited to:  CAD s/p CABG '12, carotid stenosis, COPD, HTN, HLD, hx CVA, AAA, PAD - recently started on low dose Xarelto, AAA s/p repair '11, recent hospitalization 6/22-6/24 for acute on chronic systolic HF (EF 78%), started on Entresto, beta blocker held due to bradycardia  Outreach:  Successful telephone call with Mr. Macek.  HIPAA identifiers verified.   Subjective:  Patient reports he ran out of both inhalers, albuterol + Symbicort, a few weeks ago as there are not any refills remaining.   He states he has all other medications on hand.  He reports he received Entresto from the hospital transition of care pharmacy but does not have a new prescription for future fills.  He is not sure how much he pays for his medications.    3-way call placed to [place].   -Albuterol (Provider: Dr. Vista Lawman): No refills.  Refill request faxed on 6/29. LF 08/30/2018, $3.60  -Symbicort (Provider: Dr. Vista Lawman): No refills. Refill request faxed today 6/30.  LF 08/30/18, $8.95 -Entresto: No prescription on file -Xarelto (Dr. Jeri Lager): No refills. Will be due to be filled later this week.  Refill request faxed today 6/30.     Objective: The ASCVD Risk score Mikey Bussing DC Jr., et al., 2013) failed to calculate for the following reasons:   Cannot find a previous HDL lab   Cannot find a previous total cholesterol lab  Lab Results  Component Value Date   CREATININE 1.17 10/19/2018   CREATININE 1.02 10/18/2018   CREATININE 1.14 10/17/2018    Lab Results  Component Value Date   HGBA1C 5.4 05/24/2015    Lipid Panel     Component Value Date/Time   CHOL 206 (H) 05/24/2015 1149   TRIG 120 05/24/2015 1149   HDL 80 05/24/2015 1149   CHOLHDL 2.6 05/24/2015 1149   VLDL 24 05/24/2015 1149   LDLCALC 102 05/24/2015 1149    BP Readings from Last 3 Encounters:  10/19/18 103/66  10/17/18 106/71  09/05/18 (!) 153/78    No Known Allergies  Medications Reviewed Today    Reviewed by Kassie Mends, RN (Registered Nurse) on 10/21/18 at 1107  Med List Status: <None>  Medication Order Taking? Sig Documenting Provider Last Dose Status Informant  albuterol (PROVENTIL HFA;VENTOLIN HFA) 108 (90 Base) MCG/ACT inhaler [id number] No Inhale 2 puffs into the lungs every 6 (six) hours as needed for wheezing or shortness of breath.  Patient not taking: Reported on 10/21/2018   Geradine Girt, DO Not Taking Active Self           Med Note Ebony Hail, AMBER B   Mon Oct 17, 2018  6:23 PM) Pt ran out a couple of day ago.   aspirin EC 81 MG EC tablet [id number] Yes Take 1 tablet (81 mg total) by mouth daily. Geradine Girt, DO Taking Active Self  budesonide-formoterol (SYMBICORT) 80-4.5 MCG/ACT inhaler [id number] No Inhale 2 puffs into the lungs 2 (two) times daily. [provider] Not Taking Active Self           Med Note Ebony Hail, AMBER B   Mon Oct 17, 2018  6:23 PM) Pt ran out a couple of days ago  Fluticasone-Salmeterol (ADVAIR DISKUS) 250-50 MCG/DOSE  AEPB [id number] No Inhale 1 puff into the lungs 2 (two) times daily.  Patient not taking: Reported on 10/21/2018   Geradine Girt, DO Not Taking Active Self           Med Note Ebony Hail, AMBER B   Mon Oct 17, 2018  6:23 PM) Pt ran out a couple of days ago.  furosemide (LASIX) 20 MG tablet [id number] Yes Take 20 mg by mouth daily.  [provider] Taking Active Self  gabapentin (NEURONTIN) 300 MG capsule [id number] Yes Take 300 mg by mouth 2 (two) times daily. [provider] Taking Active Self  metFORMIN (GLUCOPHAGE) 500 MG tablet [id number] Yes Take 0.5 tablets by mouth 2 (two) times daily. [provider]  Taking Active Self  nystatin (MYCOSTATIN/NYSTOP) powder [id number] Yes Apply topically 3 (three) times daily. Patwardhan, Reynold Bowen, MD Taking Active   rivaroxaban (XARELTO) 2.5 MG TABS tablet [id number] Yes Take 1 tablet (2.5 mg total) by mouth 2 (two) times daily. Miquel Dunn, NP Taking Active Self           Med Note ([place]   Mon Oct 17, 2018  6:22 PM) AM 0900 PM 1500  rosuvastatin (CRESTOR) 40 MG tablet [id number] Yes Take 40 mg by mouth daily. [provider] Taking Active Self  sacubitril-valsartan (ENTRESTO) 24-26 MG [id number] Yes Take 1 tablet by mouth 2 (two) times daily. Nigel Mormon, MD Taking Active           Assessment: Drugs sorted by system:  Neurologic/Psychologic: gabapentin  Hematologic: aspirin 81mg , rivaroxaban  Cardiovascular: furosemide, sacubitril-valsartan, rosuvastatin  Pulmonary/Allergy: albuterol inhaler, Symbicort inhaler  Endocrine: metformin  Topical: nystatin powder  Medication Review Findings:  . BB on hold due to bradycardia, will be re-addressed at next office visit with cardiology (10/26/2018) . No refills on Entresto / Xarelto: pharmacy faxed request to cardiology today.  Also called in request to office.  . No refills on albuterol / Symbicort: pharmacy faxed request to PCP, also called in request to office   Medication Assistance Findings:  No medication assistance needs identified -Already receiving FULL Extra Help LIS  Plan: . Will f/u that refills called in for patient  Ralene Bathe, PharmD, [place] [phone]

## 2018-10-26 ENCOUNTER — Other Ambulatory Visit: Payer: Self-pay

## 2018-10-26 ENCOUNTER — Encounter: Payer: Self-pay | Admitting: Cardiology

## 2018-10-26 ENCOUNTER — Ambulatory Visit (INDEPENDENT_AMBULATORY_CARE_PROVIDER_SITE_OTHER): Payer: Medicare Other | Admitting: Cardiology

## 2018-10-26 VITALS — BP 82/60 | HR 78 | Ht 67.0 in | Wt 158.0 lb

## 2018-10-26 DIAGNOSIS — I6522 Occlusion and stenosis of left carotid artery: Secondary | ICD-10-CM

## 2018-10-26 DIAGNOSIS — I739 Peripheral vascular disease, unspecified: Secondary | ICD-10-CM | POA: Diagnosis not present

## 2018-10-26 DIAGNOSIS — R600 Localized edema: Secondary | ICD-10-CM

## 2018-10-26 DIAGNOSIS — I25728 Atherosclerosis of autologous artery coronary artery bypass graft(s) with other forms of angina pectoris: Secondary | ICD-10-CM | POA: Diagnosis not present

## 2018-10-26 DIAGNOSIS — I77810 Thoracic aortic ectasia: Secondary | ICD-10-CM

## 2018-10-26 DIAGNOSIS — J449 Chronic obstructive pulmonary disease, unspecified: Secondary | ICD-10-CM

## 2018-10-26 DIAGNOSIS — I5042 Chronic combined systolic (congestive) and diastolic (congestive) heart failure: Secondary | ICD-10-CM | POA: Diagnosis not present

## 2018-10-26 MED ORDER — ENTRESTO 24-26 MG PO TABS: 1.0000 | ORAL_TABLET | ORAL | 0 refills | Status: DC

## 2018-10-26 NOTE — Progress Notes (Signed)
Primary Physician/Referring:  Benito Mccreedy, MD  Patient ID: Chris Payne, male    DOB: 02/25/1951, 68 y.o.   MRN: [id number]  Chief Complaint  Patient presents with   Coronary Artery Disease   Follow-up    HPI: Chris Payne  is a 68 y.o. male  with COPD, CAD s/p CABG by Dr. 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   He was started on Entresto at discharge and is tolerating this well. No dizziness or lightheadedness. States that he is overall feeling better. Continues to have cough, dyspnea on exertion (improved), and pain in his legs mostly at night.  He does have history of aortic root dilatation 44 mm by echo, 45 by CT in 2015. Stable by recent echo in June 2020. AAA is also stable by recent abdominal aortic duplex. He underwent lexiscan nuclear stress test in May 2020 that revealed moderate to large transmural scar with minimal peri-infarct ischemia to inferior and inferolateral wall.  LE arterial duplex performed in June 2020 was abnormal; however, medical management with Xarelto and ASA was recommended unless he develops ulcerations or ischemic changes.  He has pain and weakness in his legs with walking.  Also notices that he has some lower abdominal pain and swelling in his groin area.  He denies any ulcerations. Has chronic leg swelling and has had venous ablation in his right leg several years ago.   He denies any chest pain. Does have shortness of breath with exertion. No PND or orthopnea. Has chronic cough. He uses a seated walker for assistance with walking  due to residual weakness and previous hip surgery. Denies any palpitations.   Patient lives at home with his girlfriend.  He reports able to walk for 5-10 minutes which is worse than before. Former smoker that quit a few years ago. His girlfriend does continue to smoke.   Past Medical History:  Diagnosis Date   AAA (abdominal aortic aneurysm) ([place])    s/p repair 6/11   CAD (coronary artery disease)    s/p CABG 2/12:  LIMA to LAD, SVG to diagonal-1, SVG to ramus intermedius,, SVG to AM (Dr. Roxan Hockey) ;  b.  Myoview 10/13:  inf infarct with very mild peri-infarct ischemia, EF 49%, inf HK; c  He has severe three-vessel native coronary artery disease. Catheterization March 2015 as above   CAP (community acquired pneumonia) 02/23/2014   Carotid stenosis    a.  dopplers 0/93: LICA 26-71%;  b. Carotid dopplers 3/14:  R 0-39%, L 60-79%, repeat 6 mos   CHF (congestive heart failure) (HCC)    COPD (chronic obstructive pulmonary disease) (H[facility])    GERD (gastroesophageal reflux disease)    Headache(784.0)    Hyperlipidemia    Hypertension    Hypoxia 02/23/2014   Inguinal hernia    Myocardial infarction [facility]    PCI x3   PAD (peripheral artery disease) ([place])    ABIs 4/12:  R 0.88, L 0.92; R SFA 40%, L CFA 50%, L SFA 50-60%   Pneumonia    hx   Stroke [facility])    h/o pontine CVA  Thyroid nodule    incidental finding on carotid doppler 3/14 => thyroid U/S ordered    Past Surgical History:  Procedure Laterality Date   ABDOMINAL AORTIC ANEURYSM REPAIR  10-03-2009   CORONARY ARTERY BYPASS GRAFT  06/24/2010   LIMA to the LAD, SVG to first diagonal, SVG to ramus intermediate, SVG to acute marginal. EF 50%. 2/12   EXPLORATION POST OPERATIVE OPEN HEART     EXPLORATORY LAPAROTOMY  09/2009   ligation of lumbar arteries   EXTERNAL EAR SURGERY Left    laceration child   HIP SURGERY  40 years ago   left hip bone removal and pinning   INGUINAL HERNIA REPAIR Left 07/21/2012    Procedure: HERNIA REPAIR INGUINAL ADULT;  Surgeon: Merrie Roof, MD;  Location: [place];  Service: General;  Laterality: Left;   INSERTION OF MESH Left 07/21/2012   Procedure: INSERTION OF MESH;  Surgeon: Merrie Roof, MD;  Location: [place];  Service: General;  Laterality: Left;   LEFT HEART CATHETERIZATION WITH CORONARY/GRAFT ANGIOGRAM  06/21/2012   Procedure: LEFT HEART CATHETERIZATION WITH Beatrix Fetters;  Surgeon: Minus Breeding, MD;  Location: [facility] CATH LAB;  Service: Cardiovascular;;   LEFT HEART CATHETERIZATION WITH CORONARY/GRAFT ANGIOGRAM N/A 07/04/2013   Procedure: LEFT HEART CATHETERIZATION WITH Beatrix Fetters;  Surgeon: Sinclair Grooms, MD;  Location: [facility] CATH LAB;  Service: Cardiovascular;  Laterality: N/A;   NM MYOCAR PERF WALL MOTION  12/02/2010   inferoapical defect is fixed c/w prior infarction/scar, there is minimal borderzone ischemia.    Social History   Socioeconomic History   Marital status: Single    Spouse name: Rod Holler   Number of children: 0   Years of education: 10   Highest education level: Not on file  Occupational History   Occupation: disabled    Comment: back pain, hernia, CAD, AAA, Stroke  Social Designer, fashion/clothing strain: Not on file   Food insecurity    Worry: Not on file    Inability: Not on file   Transportation needs    Medical: Not on file    Non-medical: Not on file  Tobacco Use   Smoking status: Former Smoker    Packs/day: 1.00    Years: 30.00    Pack years: 30.00    Types: Cigarettes    Quit date: 01/25/2010    Years since quitting: 8.7   Smokeless tobacco: Never Used  Substance and Sexual Activity   Alcohol use: No    Comment: former drinker - Quit 2011.   Drug use: No   Sexual activity: Not Currently  Lifestyle   Physical activity    Days per week: Not on file    Minutes per session: Not on file   Stress: Not on file  Relationships   Social connections    Talks on phone: Not on file     Gets together: Not on file    Attends religious service: Not on file    Active member of club or organization: Not on file    Attends meetings of clubs or organizations: Not on file    Relationship status: Not on file   Intimate partner violence    Fear of current or ex partner: Not on file    Emotionally abused: Not on file    Physically abused: Not on file    Forced sexual activity: Not on file  Other Topics Concern   Not on file  Social History Narrative   Marital  status: married x 16 years; first marriage      Children:  None; 3 stepchildren; 4 grandchildren.  No gg.      Lives: with wife, Joslyn Hy and his girlfriend.      Employment: disability since 2010 CABG, AAA, CVA.  Previous furniture work.      Tobacco:  Quit in 2009; smoked 30 years.      Alcohol: quit in 2009; weekend drinking      ADLs: uses rolling walker within the home; has four pronged walker at home; has drivers license yet no car.  Cooks; no cleaning; no yard work.    Education 10th grade.   Right handed.   Caffeine five cups of coffee daily.      Advanced Directives:  DNR/DNI.      Current Outpatient Medications on File Prior to Visit  Medication Sig Dispense Refill   albuterol (PROVENTIL HFA;VENTOLIN HFA) 108 (90 Base) MCG/ACT inhaler Inhale 2 puffs into the lungs every 6 (six) hours as needed for wheezing or shortness of breath. 1 Inhaler 0   aspirin EC 81 MG EC tablet Take 1 tablet (81 mg total) by mouth daily.     budesonide-formoterol (SYMBICORT) 80-4.5 MCG/ACT inhaler Inhale 2 puffs into the lungs 2 (two) times daily.     furosemide (LASIX) 20 MG tablet Take 20 mg by mouth daily.      gabapentin (NEURONTIN) 300 MG capsule Take 300 mg by mouth 2 (two) times daily.     metFORMIN (GLUCOPHAGE) 500 MG tablet Take 0.5 tablets by mouth 2 (two) times daily.     nystatin (MYCOSTATIN/NYSTOP) powder Apply topically 3 (three) times daily. 15 g 0   rosuvastatin (CRESTOR) 40 MG tablet Take 40 mg by mouth  daily.     XARELTO 2.5 MG TABS tablet TAKE 1 TABLET BY MOUTH TWICE DAILY 60 tablet 1   No current facility-administered medications on file prior to visit.     Review of Systems  Constitution: Negative for decreased appetite, malaise/fatigue, weight gain and weight loss.  Eyes: Negative for visual disturbance.  Cardiovascular: Positive for claudication, dyspnea on exertion and leg swelling. Negative for chest pain, orthopnea, palpitations and syncope.  Respiratory: Positive for cough. Negative for hemoptysis and wheezing.   Endocrine: Negative for cold intolerance and heat intolerance.  Hematologic/Lymphatic: Does not bruise/bleed easily.  Skin: Negative for color change, nail changes and poor wound healing.  Musculoskeletal: Negative for muscle weakness and myalgias.  Gastrointestinal: Negative for abdominal pain, change in bowel habit, nausea and vomiting.  Neurological: Negative for difficulty with concentration, dizziness, focal weakness and headaches.  Psychiatric/Behavioral: Negative for altered mental status and suicidal ideas.  All other systems reviewed and are negative.     Objective  Blood pressure (!) 82/60, pulse 78, height 5\' 7"  (1.702 m), weight 158 lb (71.7 kg), SpO2 96 %. Body mass index is 24.75 kg/m.    Physical Exam  Constitutional: He is oriented to person, place, and time. Vital signs are normal. He appears well-developed and well-nourished.  HENT:  Head: Normocephalic and atraumatic.  Neck: Normal range of motion. Neck supple.  Cardiovascular: Normal rate, regular rhythm, normal heart sounds and intact distal pulses.  Pulses:      Femoral pulses are 2+ on the right side and 1+ on the left side.      Popliteal pulses are 1+ on the right side and 1+ on the left side.       Dorsalis pedis pulses are 1+ on the right  side and 1+ on the left side.       Posterior tibial pulses are 0 on the right side and 0 on the left side.  2+ pitting edema Warm bilateral  LE DP 0.5 bilateral Popliteal 0.5   Pulmonary/Chest: Effort normal. No accessory muscle usage. No respiratory distress. He has wheezes. He has rhonchi.  Abdominal: Soft. Bowel sounds are normal.  Musculoskeletal: Normal range of motion.  Neurological: He is alert and oriented to person, place, and time.  Skin: Skin is warm and dry.  Psychiatric: He has a normal mood and affect. His behavior is normal.  Vitals reviewed.  Radiology:  CT of abdomen and pelvis 12/04/2017:  1. No acute abnormality. 2. Mild diffuse hepatic steatosis. 3. Cholelithiasis. 4. Stable small right adrenal adenoma. 5. Small midline ventral hernia containing fat. 6. Atheromatous arterial calcifications, including the coronary arteries. 7. Interval 4.3 cm infrarenal abdominal aortic aneurysm at the location of the proximal portion of an aortobifemoral bypass graft. Recommend followup by ultrasound in 1 year. This recommendation follows ACR consensus guidelines: White Paper of the ACR Incidental Findings Committee II on Vascular Findings. J Am Coll Radiol 2013; 10:789-794. 8. 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 9. Small midline ventral hernia containing fat.  CTA of chest 09/21/2013: There is stable dilatation of the ascending thoracic aorta. The maximal diameter at the level of the sinuses of Valsalva is 4.3 cm. The proximal ascending thoracic aorta measures 4.2 cm. The proximal arch measures 3.8 cm. The distal arch measures 3.3 cm. The descending thoracic aorta measures 2.7 cm. The patient is status post CABG. There is no evidence of aortic dissection.  CTA of chest 2014: 1.  No evidence of pulmonary embolism. 2.  COPD/emphysema.  Scarring in the lower lobes, right middle lobe, and lingula.  No acute cardiopulmonary disease. 3.  Ectatic ascending thoracic aorta measuring 4.5 cm diameter, unchanged since February, 2012.  MRI/MRA of brain 08/23/2013:  1. Small  but patent right vertebral artery to the level of the right PICA. 2. 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 There is flow within the posterior cerebral arteries bilaterally, likely via collateral circulation from the anterior circulation. Overall, these findings are not significantly changed relative to previous MRA from 10/10/2009. MRA: 1. Small but patent right vertebral artery to the level of the right PICA. 2. 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 There is flow within the posterior cerebral arteries bilaterally, likely via collateral circulation from the anterior circulation. Overall, these findings are not significantly changed relative to previous MRA from 10/10/2009. 1. No acute intracranial infarct or other abnormality identified within the brain. 2. Age-related atrophy with moderate chronic microvascular ischemic disease involving the supratentorial white matter, advanced relative to prior MRI from 10/10/2009. 3. Remote pontine, left frontal lobe, right basal ganglia, and left cerebellar infarcts.  Laboratory examination:    CMP Latest Ref Rng & Units 10/19/2018 10/18/2018 10/17/2018  Glucose 70 - 99 mg/dL 115(H) 114(H) 125(H)  BUN 8 - 23 mg/dL 15 14 14   Creatinine 0.61 - 1.24 mg/dL 1.17 1.02 1.14  Sodium 135 - 145 mmol/L 142 140 137  Potassium 3.5 - 5.1 mmol/L 3.7 3.0(L) 3.4(L)  Chloride 98 - 111 mmol/L 104 101 101  CO2 22 - 32 mmol/L 31 29 26   Calcium 8.9 - 10.3 mg/dL 9.1 9.0 9.3  Total Protein 6.5 - 8.1 g/dL - - 6.7  Total Bilirubin 0.3 - 1.2 mg/dL - - 0.7  Alkaline Phos 38 - 126 U/L - - 93  AST 15 - 41 U/L - - 17  ALT 0 - 44 U/L - - 15   CBC Latest Ref Rng & Units 10/17/2018 05/22/2018 05/21/2018  WBC 4.0 - 10.5 K/uL 9.6 6.2 10.0  Hemoglobin 13.0 - 17.0 g/dL 16.7 15.1 16.8  Hematocrit 39.0 - 52.0 % 50.8 47.2 52.7(H)  Platelets 150 - 400 K/uL 181 213 201   Lipid Panel      Component Value Date/Time   CHOL 206 (H) 05/24/2015 1149   TRIG 120 05/24/2015 1149   HDL 80 05/24/2015 1149   CHOLHDL 2.6 05/24/2015 1149   VLDL 24 05/24/2015 1149   LDLCALC 102 05/24/2015 1149   HEMOGLOBIN A1C Lab Results  Component Value Date   HGBA1C 5.4 05/24/2015   MPG 108 05/24/2015   TSH No results for input(s): TSH in the last 8760 hours.  Cardiac Studies:   Carotid artery duplex  10/04/2018: Minimal stenosis in the right carotid bulb with heterogeneous plaque.  Stenosis in the left internal carotid artery (50-69%). Antegrade right vertebral artery flow. Antegrade left vertebral artery flow.  Follow up in six months is appropriate if clinically indicated.  Abdominal Aortic Duplex  10/04/2018: Moderate dilatation of the abdominal aorta is noted in the mid and distal aorta, aortic aneurysm measuring 3.51 x 3.5 x 3.5 cm is seen.  Diffuse heterogeneous plaque noted. Iliac arteries not well visualized.  Recheck in 1 year.   [place] Stress Test 09/23/2018: Nondiagnostic ECG stress due to pharmacologic stress. Perfusion images reveal a moderate to large sized inferior and inferolateral transmural scar with minimal peri-infarct ischemia.  Gated SPECT images reveal inferior wall akinesis, LVEF mildly depressed at 41%. intermediate risk study.  Lower Extremity Arterial Duplex 09/22/2018: Right lower extremity demonstrates diffuse mixed plaque throughout. There is moderately abnormal waveform throughout the right lower extremity suggestive of moderate to severe diffuse disease. Right PFA has >50% stenosis, probably > 75%  stenosis. Cannot exclude inflow disease (right CIA).  lLeft ower extremity demonstrates diffuse mixed plaque throughout.  Left CIA stenosis of <50%. Distal left SFA >50% stenosis.  Moderately abnormal PVR waveforms of the right ankle. Moderately abnormal PVR waveforms of the left ankle.  Mildly decreased right resting ABI at 0.85 and m moderately  decreased left resting ABI 0.77.   Echocardiogram 09/22/2018: Moderately depressed LV systolic function with EF 40%. Left ventricle cavity is normal in size. Mild concentric hypertrophy of the left ventricle. Mild global hypokinesis. Abnormal septal wall motion due to post-operative coronary artery bypass graft. Doppler evidence of grade I (impaired) diastolic dysfunction, normal LAP. Calculated EF 40%. Mild (Grade I) aortic regurgitation. The aortic root is mildly dilated at 4.0 cm.  Assessment     ICD-10-CM   1. PAD (peripheral artery disease) (HCC)  I73.9 EKG 12-Lead  2. Chronic combined systolic and diastolic heart failure (HCC)  S92.33 Basic metabolic panel    EKG 00/76/2263: Sinus bradycardia at 58 bpm, left atrial enlargement, diffuse nonspecific T wave abnormality.    Recommendations:   Chris Payne  is a 68 y.o. male  with COPD, CAD s/p CABG by Dr. Roxan Hockey in 2012 (patent LIMA-LAD, patent SVG-ramus, known occluded SVG-diagonal, and SVG-RCA as of 2015), HTN, HLD, Hx CVA, PAD, AAA s/p repair in 2011 using Dacron graft with aortobifemoral graft goal-2011, and former tobacco use.  Patient is overall feeling well since discharge from the hospital, but  he does continue to have cough, leg swelling, and dyspnea on exertion that is likely multi-factoral. He is at risk to quickly decompensate, and feel that he would benefit from home health services to frequently monitor him for signs of deterioration, medication management, and to help him with lifestyle changes/education to prevent CHF exacerbations. Will place referral for this. He is not on beta blocker in view of bradycardia. Has continued 2+ pitting leg edema bilaterally that is also likely contributed by venous insufficiency, I have encouraged him to wear support stockings daily. He does report having difficulty with getting these on, hopefully, home health will be able to also help him with this.  He was started on Entresto at  discharge, and noted to have significant drop in his blood pressure. He is asymptomatic today despite his low BP. Also at risk for AKI and will need to continue with daily lasix. I will change his Entresto to 1 tablet every other day to help prevent hypotension, but hopefully will still provide some CHF benefit. Will check BMP in 1 week for close monitoring of kidney function. Patient has limited activity due to dyspnea on exertion; therefore, assessment of his functional capacity is limited. Suspect patient would have some angina symptoms with over exertion. As he was only noted to have minimal peri-infarct ischemia on recent stress test, would recommend continued aggressive medical management.   Patient has exertional leg weakness, does have burning rest pain. Lower extremities are warm today. I have discussed recently obtained test results with the patient, as he does not have critical limb ischemia on exam today, I feel that his best option would be to pursue medical management unless he noted to have ulcerations or critical limb ischemia. Continue with aspirin and low dose Xarelto. I have strongly encouraged him to start walking some with use of his walker even inside of his house to help with his circulation and overall deconditioning.   He does have abdominal aortic aneurysm measuring 3.1 cm by recent abdominal duplex; however, was noted to be 4.3 cm by last CT of abdomen. Can consider CTA of abdomen at some point, but suspect that this has actually decreased in size.  He also has aortic root dilation stable at 4.0 cm and will need annual surveillance. He also has 50-69% stenosis in left ICA and will repeat duplex in 6 months.   *I have discussed this case with Dr. Virgina Jock and he personally examined the patient and participated in formulating the plan.*   Miquel Dunn, MSN, APRN, FNP-C [facility]. [place] Office: [phone] Fax: [phone]

## 2018-10-26 NOTE — Patient Instructions (Signed)
Take entresto 1 tablet every other day  Check kidney function in 1 week at [facility]

## 2018-10-27 ENCOUNTER — Other Ambulatory Visit: Payer: Self-pay | Admitting: *Deleted

## 2018-10-27 ENCOUNTER — Encounter: Payer: Self-pay | Admitting: Cardiology

## 2018-10-27 NOTE — Patient Outreach (Signed)
Outreach call to pt for transition of care week 2, no answer to telephone, left voicemail requesting return phone call.  Rn CM mailed unsuccessful outreach letter to pt home.  PLAN Outreach pt in 3-4 business days  Jacqlyn Larsen [facility], [place] [phone]

## 2018-10-28 ENCOUNTER — Other Ambulatory Visit: Payer: Self-pay | Admitting: Cardiology

## 2018-10-28 DIAGNOSIS — I10 Essential (primary) hypertension: Secondary | ICD-10-CM

## 2018-11-01 ENCOUNTER — Other Ambulatory Visit: Payer: Self-pay | Admitting: Pharmacist

## 2018-11-01 ENCOUNTER — Other Ambulatory Visit: Payer: Self-pay

## 2018-11-01 NOTE — Patient Outreach (Signed)
[place] [facility]) Care Management  11/01/2018   Chris Payne Sep 27, 1950 [id number] Transition of call phone call: Subjective: Placed call to patient who answered and reports that he is doing okay. Reports weighing daily with todays weight of 174 pounds and yesterdays weight of 173 pounds.  Reports that he lives with his wife who has cancer.  Reports that he uses a cane to help with waling. Reports that he has increased his activity. Reports difficulty using compression hose due to difficulty putting them on. Reports that his wife is not able to help due to weakness. Patient is interested in advanced directives.   Patient reports he eats 2 meals per day. One in the am and 1 meal around 4pm.  Also drinks ensure.   Patient reports that he has all his medications and is taking them as prescribed. Reports follow up plans with primary MD later this week.   Objective: Voice sounds weak on phone call.. Vitals:   11/01/18 1557  Weight: 174 lb (78.9 kg)  Height: 1.702 m (5\' 7" )    Current Medications:  Current Outpatient Medications  Medication Sig Dispense Refill  . albuterol (PROVENTIL HFA;VENTOLIN HFA) 108 (90 Base) MCG/ACT inhaler Inhale 2 puffs into the lungs every 6 (six) hours as needed for wheezing or shortness of breath. 1 Inhaler 0  . aspirin EC 81 MG EC tablet Take 1 tablet (81 mg total) by mouth daily.    . furosemide (LASIX) 20 MG tablet Take 20 mg by mouth daily.     Marland Kitchen gabapentin (NEURONTIN) 300 MG capsule Take 300 mg by mouth 2 (two) times daily.    . metFORMIN (GLUCOPHAGE) 500 MG tablet Take 0.5 tablets by mouth 2 (two) times daily.    Marland Kitchen nystatin (MYCOSTATIN/NYSTOP) powder Apply topically 3 (three) times daily. 15 g 0  . rosuvastatin (CRESTOR) 40 MG tablet Take 40 mg by mouth daily.    . sacubitril-valsartan (ENTRESTO) 24-26 MG Take 1 tablet by mouth every other day. 60 tablet 0  . XARELTO 2.5 MG TABS tablet TAKE 1 TABLET BY MOUTH TWICE DAILY 60 tablet 1  .  budesonide-formoterol (SYMBICORT) 80-4.5 MCG/ACT inhaler Inhale 2 puffs into the lungs 2 (two) times daily.     No current facility-administered medications for this visit.     Functional Status:  In your present state of health, do you have any difficulty performing the following activities: 11/01/2018 10/17/2018  Hearing? N N  Vision? N N  Difficulty concentrating or making decisions? N Y  Walking or climbing stairs? N Y  Dressing or bathing? Y N  Comment wife assist -  Doing errands, shopping? [name]  Comment wife assist -  Conservation officer, nature and eating ? N -  Using the Toilet? N -  In the past six months, have you accidently leaked urine? N -  Do you have problems with loss of bowel control? N -  Managing your Medications? Y -  Comment out of refills -  Managing your Finances? N -  Housekeeping or managing your Housekeeping? Y -  Comment wife assist -  Some recent data might be hidden    Fall/Depression Screening: Fall Risk  11/01/2018 07/14/2018 12/10/2015  Falls in the past year? 0 0 Yes  Number falls in past yr: - 0 1  Injury with Fall? - 0 No  Comment - - -  Risk for fall due to : - - Impaired balance/gait;Impaired mobility  Follow up - - Education provided;Falls prevention discussed  PHQ 2/9 Scores 11/01/2018 07/14/2018 12/10/2015 10/28/2015 09/05/2015 08/20/2015 08/20/2015  PHQ - 2 Score 0 0 0 0 0 1 0    Assessment:  (1) reviewed THN program.  (2) currently weighing daily an taking medications as prescribed. (3) follow up planned with primary MD this week.  (4)interested in advanced directives   Plan:  (1) reviewed weekly phone calls for follow up. Encouraged patient to call sooner if needed. (2) reviewed importance of daily weights, recording of weights and when to call MD. (3) reviewed appointment date and time as well as location and phone number with patient according to discharge papers.  (4) will mail advanced directive packet.  Follow up planned in 1 week. This note  sent to MD.  [facility] CM Care Plan Problem One     Most Recent Value  Care Plan Problem One  Knowledge deficit related to CHF  Role Documenting the Problem One  Care Management [place] for Problem One  Active  THN Long Term Goal   Pt will verbalize/ demonstrate improved self care for CHF within 60 days  THN Long Term Goal Start Date  10/21/18  Interventions for Problem One Long Term Goal  Reviewed current abilites for self care at time. Reviewed importance of doing things as he is able. Reviewed safety precautions.   THN CM Short Term Goal #1   Pt will verbalize CHF action plan/ zones within 30 days  THN CM Short Term Goal #1 Start Date  10/21/18  Interventions for Short Term Goal #1  Encouraged patient to continue to weigh daliy and record weights. Encouraged patient to call MD for weght gain according to heart failure zones.   THN CM Short Term Goal #2   Pt will verbalize understanding of low sodium diet within 30 days  THN CM Short Term Goal #2 Start Date  10/21/18  Interventions for Short Term Goal #2  Encouraged low salt diet. Reviewed food that are high salt and which foods to avoid.  Will mail CHF salt tear off poster to patient and review at next call.     THN CM Care Plan Problem Two     Most Recent Value  Care Plan Problem Two  Knowledge deficit related to COPD  Role Documenting the Problem Two  Care Management Coordinator  Care Plan for Problem Two  Active  THN CM Short Term Goal #1   Pt will verbalize COPD action plan and improved self care for COPD within 30 days  THN CM Short Term Goal #1 Start Date  10/21/18  Interventions for Short Term Goal #2   Encouragd patient to avoid sick contacts and call MD with chagnes in condition.      Tomasa Rand, RN, BSN, CEN [facility] [employer] [phone]

## 2018-11-01 NOTE — Patient Outreach (Signed)
[place] [facility]) Care Management  [place] 11/01/2018  Chris Payne 01/04/1951 [id number]  Reason for call: f/u on medication adherence  Successful call with Chris Payne.  Patient reports he was able to get refills on Xarelto and Entresto and has both medications at home.  We reviewed change in instructions on Entresto as noted from O/V with Dr. Georgina Peer on 7/2 to reduce to every other day.   Patient voiced understanding.  He states he has an o/v with PCP Dr. Vista Lawman this Friday and will request new RXs on inhalers.  No further medication concerns from patient at this time.   Plan: Will route note to PCP re: refills on inhalers Will close [place] case as no other medication issues at this time.   Ralene Bathe, PharmD, [place] [phone]    s

## 2018-11-02 ENCOUNTER — Other Ambulatory Visit: Payer: Self-pay | Admitting: Cardiology

## 2018-11-02 DIAGNOSIS — I10 Essential (primary) hypertension: Secondary | ICD-10-CM

## 2018-11-02 NOTE — Telephone Encounter (Signed)
Please fill

## 2018-11-04 ENCOUNTER — Other Ambulatory Visit: Payer: Self-pay | Admitting: Cardiology

## 2018-11-04 DIAGNOSIS — I5042 Chronic combined systolic (congestive) and diastolic (congestive) heart failure: Secondary | ICD-10-CM | POA: Diagnosis not present

## 2018-11-05 LAB — BASIC METABOLIC PANEL
BUN/Creatinine Ratio: 11 (ref 10–24)
BUN: 15 mg/dL (ref 8–27)
CO2: 23 mmol/L (ref 20–29)
Calcium: 9.5 mg/dL (ref 8.6–10.2)
Chloride: 100 mmol/L (ref 96–106)
Creatinine, Ser: 1.38 mg/dL — ABNORMAL HIGH (ref 0.76–1.27)
GFR calc Af Amer: 60 mL/min/{1.73_m2} (ref >59)
GFR calc non Af Amer: 52 mL/min/{1.73_m2} — ABNORMAL LOW (ref >59)
Glucose: 182 mg/dL — ABNORMAL HIGH (ref 65–99)
Potassium: 4.5 mmol/L (ref 3.5–5.2)
Sodium: 142 mmol/L (ref 134–144)

## 2018-11-08 ENCOUNTER — Other Ambulatory Visit: Payer: Self-pay

## 2018-11-08 NOTE — Patient Outreach (Signed)
Transition of care:  Placed call to patient who answered and reports that he is doing okay.  Reports his breathing is the same. Reports weight is up and down. Reports swelling is about the same  Denies any new concern today except it is hot and he has to stay inside because he can not breath well in the heat an humidity.   PLAN: will plan follow up in 1 week.  Tomasa Rand, RN, BSN, CEN [facility] [employer] [phone]

## 2018-11-10 DIAGNOSIS — I2510 Atherosclerotic heart disease of native coronary artery without angina pectoris: Secondary | ICD-10-CM | POA: Diagnosis not present

## 2018-11-10 DIAGNOSIS — E782 Mixed hyperlipidemia: Secondary | ICD-10-CM | POA: Diagnosis not present

## 2018-11-10 DIAGNOSIS — E1165 Type 2 diabetes mellitus with hyperglycemia: Secondary | ICD-10-CM | POA: Diagnosis not present

## 2018-11-10 DIAGNOSIS — J449 Chronic obstructive pulmonary disease, unspecified: Secondary | ICD-10-CM | POA: Diagnosis not present

## 2018-11-10 DIAGNOSIS — I10 Essential (primary) hypertension: Secondary | ICD-10-CM | POA: Diagnosis not present

## 2018-11-15 ENCOUNTER — Other Ambulatory Visit: Payer: Self-pay

## 2018-11-15 NOTE — Patient Outreach (Signed)
Transition of care:    Placed call to patient who answered and identified himself.. Patient reports weight is stable around 174 pounds. Reports decreased swelling.   Reports that he saw primary care MD last week but forgot to ask for refills for albuterol and symbicort.    Reports yellow sputum. Denies fever.  PLAN: placed call to [place] -spoke with Abigail Butts for confirms no refills and last filled in May.  Requested pharmacy send fax to MD requesting refills.  Placed call to MD office and requested refills to be called in asap. Spoke with [employer].  Requested a call back from nurse.    11/16/2018 0900  No call back from MD office.  0930  Placed call to [place] to inquire about RX being refilled.  Spoke with Abigail Butts who reports no rx refilled.  [place] call to MD office and spoke with Onalee Hua again who reports she will send another message to MD.  Confirmed fax number. [phone]  Placed call back to pharmacy and spoke with Abigail Butts and provided her the update. She looked back in the records and found 1 refill left and will fill this for patient. I am still trying to get RX refills moving forward.  3:15pm  Placed call back to MD office and was informed that Symicort was refilled but not the albuterol. Another message send to MD from front desk staff.  330pm  Placed call to patient and informed him that his RX is ready for pick up.  345pm  Confirmed with [facility] pharmacy that new RX for Symbicort was received.    Tomasa Rand, RN, BSN, CEN [facility] [employer] [phone]

## 2018-11-22 ENCOUNTER — Other Ambulatory Visit: Payer: Self-pay

## 2018-11-22 NOTE — Patient Outreach (Signed)
Transition of care call:  Placed call to patient who reports that he is doing well. Reports he did pick up RX that MD called in for his inhalers.  Patient reports that he needs a new rolling walker.  I reviewed with patient that MD office would have to write a prescription for walker and send to patients choice of home health agency.  Patient requested assistance with this task.    Placed call to MD office and confirmed RX called to [facility] for albuterol and symbicort on 11/16/2018.  Request an RX for a walker to be sent to home health for DME.  Spoke with Freida Busman who reports that he will send note to MD.   PLAN: follow up call to patient in 1 week to confirm DME received.   Tomasa Rand, RN, BSN, CEN [facility] [employer] [phone]

## 2018-11-29 ENCOUNTER — Encounter: Payer: Self-pay | Admitting: Cardiology

## 2018-11-29 ENCOUNTER — Other Ambulatory Visit: Payer: Self-pay

## 2018-11-29 ENCOUNTER — Encounter: Payer: Medicare Other | Admitting: Cardiology

## 2018-11-29 NOTE — Progress Notes (Signed)
Erroneous encounter

## 2018-12-01 ENCOUNTER — Other Ambulatory Visit: Payer: Self-pay | Admitting: Cardiology

## 2018-12-01 ENCOUNTER — Other Ambulatory Visit: Payer: Self-pay

## 2018-12-01 DIAGNOSIS — I739 Peripheral vascular disease, unspecified: Secondary | ICD-10-CM

## 2018-12-01 DIAGNOSIS — I25728 Atherosclerosis of autologous artery coronary artery bypass graft(s) with other forms of angina pectoris: Secondary | ICD-10-CM

## 2018-12-01 NOTE — Patient Outreach (Signed)
Telephone assessment:  Placed call to patient who answered and reports that he is feeling okay.  Reports today's weight of 172 pounds. Reports he is wearing his compression hose and in his swelling in his legs has gone down. Reports now that he has his inhalers he is breathing better.  Reports follow up planned with cardiology and primary MD next week. Encouraged patient to ask MD about prescription for new walker. He agreed that he would. I have not heard back from MD and neither has patient.  PLAN: follow up call in 2 weeks.  Tomasa Rand, RN, BSN, CEN [facility] [employer] [phone]

## 2018-12-05 ENCOUNTER — Ambulatory Visit: Payer: Medicare Other | Admitting: Cardiology

## 2018-12-05 ENCOUNTER — Other Ambulatory Visit: Payer: Self-pay

## 2018-12-05 ENCOUNTER — Encounter: Payer: Self-pay | Admitting: Cardiology

## 2018-12-05 VITALS — BP 114/70 | HR 72 | Ht 67.0 in | Wt 172.0 lb

## 2018-12-05 DIAGNOSIS — I129 Hypertensive chronic kidney disease with stage 1 through stage 4 chronic kidney disease, or unspecified chronic kidney disease: Secondary | ICD-10-CM

## 2018-12-05 DIAGNOSIS — I25728 Atherosclerosis of autologous artery coronary artery bypass graft(s) with other forms of angina pectoris: Secondary | ICD-10-CM

## 2018-12-05 DIAGNOSIS — I5042 Chronic combined systolic (congestive) and diastolic (congestive) heart failure: Secondary | ICD-10-CM

## 2018-12-05 DIAGNOSIS — N183 Chronic kidney disease, stage 3 unspecified: Secondary | ICD-10-CM

## 2018-12-05 DIAGNOSIS — R600 Localized edema: Secondary | ICD-10-CM | POA: Diagnosis not present

## 2018-12-05 DIAGNOSIS — I739 Peripheral vascular disease, unspecified: Secondary | ICD-10-CM | POA: Diagnosis not present

## 2018-12-05 NOTE — Progress Notes (Signed)
Primary Physician/Referring:  Chris Mccreedy, MD  Patient ID: Chris Payne, male    DOB: [birth year], 68 y.o.   MRN: [id number]  Chief Complaint  Patient presents with   Coronary Artery Disease   Hypertension   Follow-up    HPI: Chris Payne  is a 68 y.o. male  with COPD, CAD s/p CABG by Dr. Roxan Hockey in 2012 (patent LIMA-LAD, patent SVG-ramus, known occluded SVG-diagonal, and SVG-RCA as of 2015), HTN, HLD, Hx CVA, PAD, AAA s/p repair in 2011 using Dacron graft with aortobifemoral graft goal-2011, aortic root dilation, chronic systolic and diastolic CHF with LVEF of 40% in June 2020, and former tobacco use, admitted to the hospital on 10/17/2018 for heart failure exacerbation.   At his last office visit, Chris Payne was reduced to once every other day in view of CKD and hypotension. Home health was also ordered. Patient reports that he is doing well and has been walking more with starting home health and he does feel that this is helping him. He does have occasional dizziness, but not daily. States that BP has been stable. Denies any chest pain. Dyspnea on exertion is stable. States leg edema has improved with support stockings.  He does have history of aortic root dilatation 44 mm by echo, 45 by CT in 2015. Stable by recent echo in June 2020. AAA is also stable by recent abdominal aortic duplex. He underwent lexiscan nuclear stress test in May 2020 that revealed moderate to large transmural scar with minimal peri-infarct ischemia to inferior and inferolateral wall.  LE arterial duplex performed in June 2020 was abnormal; however, medical management with Xarelto and ASA was recommended unless he develops ulcerations or ischemic changes. Claudication is stable.      No PND or orthopnea. Has chronic cough. He uses a seated walker for assistance with walking due to residual weakness and previous hip surgery. Denies any palpitations.   Patient lives at home with his girlfriend. Former  smoker that quit a few years ago. His girlfriend does continue to smoke.   Past Medical History:  Diagnosis Date   AAA (abdominal aortic aneurysm) ([place])    s/p repair 6/11   CAD (coronary artery disease)    s/p CABG 2/12:  LIMA to LAD, SVG to diagonal-1, SVG to ramus intermedius,, SVG to AM (Dr. Roxan Hockey) ;  b.  Myoview 10/13:  inf infarct with very mild peri-infarct ischemia, EF 49%, inf HK; c  He has severe three-vessel native coronary artery disease. Catheterization March 2015 as above   CAP (community acquired pneumonia) 02/23/2014   Carotid stenosis    a.  dopplers 3/83: LICA 29-19%;  b. Carotid dopplers 3/14:  R 0-39%, L 60-79%, repeat 6 mos   CHF (congestive heart failure) (H[facility])    COPD (chronic obstructive pulmonary disease) (H[facility])    GERD (gastroesophageal reflux disease)    Headache(784.0)    Hyperlipidemia    Hypertension    Hypoxia 02/23/2014   Inguinal hernia    Myocardial infarction [facility])    PCI x3   PAD (peripheral artery disease) ([place])    ABIs 4/12:  R 0.88, L 0.92; R SFA 40%, L CFA 50%, L SFA 50-60%   Pneumonia    hx   Stroke [facility])    h/o pontine CVA   Thyroid nodule    incidental finding on carotid doppler 3/14 => thyroid U/S ordered    Past Surgical History:  Procedure Laterality Date   ABDOMINAL AORTIC ANEURYSM REPAIR  10-03-2009  CORONARY ARTERY BYPASS GRAFT  06/24/2010   LIMA to the LAD, SVG to first diagonal, SVG to ramus intermediate, SVG to acute marginal. EF 50%. 2/12   EXPLORATION POST OPERATIVE OPEN HEART     EXPLORATORY LAPAROTOMY  09/2009   ligation of lumbar arteries   EXTERNAL EAR SURGERY Left    laceration child   HIP SURGERY  40 years ago   left hip bone removal and pinning   INGUINAL HERNIA REPAIR Left 07/21/2012   Procedure: HERNIA REPAIR INGUINAL ADULT;  Surgeon: Merrie Roof, MD;  Location: [facility];  Service: General;  Laterality: Left;   INSERTION OF MESH Left 07/21/2012   Procedure: INSERTION OF MESH;   Surgeon: Merrie Roof, MD;  Location: [facility];  Service: General;  Laterality: Left;   LEFT HEART CATHETERIZATION WITH CORONARY/GRAFT ANGIOGRAM  06/21/2012   Procedure: LEFT HEART CATHETERIZATION WITH Beatrix Fetters;  Surgeon: Minus Breeding, MD;  Location: [facility] CATH LAB;  Service: Cardiovascular;;   LEFT HEART CATHETERIZATION WITH CORONARY/GRAFT ANGIOGRAM N/A 07/04/2013   Procedure: LEFT HEART CATHETERIZATION WITH Beatrix Fetters;  Surgeon: Sinclair Grooms, MD;  Location: [facility] CATH LAB;  Service: Cardiovascular;  Laterality: N/A;   NM MYOCAR PERF WALL MOTION  12/02/2010   inferoapical defect is fixed c/w prior infarction/scar, there is minimal borderzone ischemia.    Social History   Socioeconomic History   Marital status: Married    Spouse name: Chris Payne   Number of children: 0   Years of education: 10   Highest education level: Not on file  Occupational History   Occupation: disabled    Comment: back pain, hernia, CAD, AAA, Stroke  Social Designer, fashion/clothing strain: Not on file   Food insecurity    Worry: Not on file    Inability: Not on file   Transportation needs    Medical: Not on file    Non-medical: Not on file  Tobacco Use   Smoking status: Former Smoker    Packs/day: 1.00    Years: 30.00    Pack years: 30.00    Types: Cigarettes    Quit date: 01/25/2010    Years since quitting: 8.8   Smokeless tobacco: Never Used  Substance and Sexual Activity   Alcohol use: No    Comment: former drinker - Quit 2011.   Drug use: No   Sexual activity: Not Currently  Lifestyle   Physical activity    Days per week: Not on file    Minutes per session: Not on file   Stress: Not on file  Relationships   Social connections    Talks on phone: Not on file    Gets together: Not on file    Attends religious service: Not on file    Active member of club or organization: Not on file    Attends meetings of clubs or organizations: Not on file     Relationship status: Not on file   Intimate partner violence    Fear of current or ex partner: Not on file    Emotionally abused: Not on file    Physically abused: Not on file    Forced sexual activity: Not on file  Other Topics Concern   Not on file  Social History Narrative   Marital status: married x 16 years; first marriage      Children:  None; 3 stepchildren; 4 grandchildren.  No gg.      Lives: with wife, Chris Payne and his girlfriend.  Employment: disability since 2010 CABG, AAA, CVA.  Previous furniture work.      Tobacco:  Quit in 2009; smoked 30 years.      Alcohol: quit in 2009; weekend drinking      ADLs: uses rolling walker within the home; has four pronged walker at home; has drivers license yet no car.  Cooks; no cleaning; no yard work.    Education 10th grade.   Right handed.   Caffeine five cups of coffee daily.      Advanced Directives:  DNR/DNI.      Current Outpatient Medications on File Prior to Visit  Medication Sig Dispense Refill   albuterol (PROVENTIL HFA;VENTOLIN HFA) 108 (90 Base) MCG/ACT inhaler Inhale 2 puffs into the lungs every 6 (six) hours as needed for wheezing or shortness of breath. 1 Inhaler 0   aspirin EC 81 MG EC tablet Take 1 tablet (81 mg total) by mouth daily.     budesonide-formoterol (SYMBICORT) 80-4.5 MCG/ACT inhaler Inhale 2 puffs into the lungs 2 (two) times daily.     furosemide (LASIX) 20 MG tablet Take 20 mg by mouth daily.      gabapentin (NEURONTIN) 300 MG capsule Take 300 mg by mouth 2 (two) times daily.     metFORMIN (GLUCOPHAGE) 500 MG tablet Take 0.5 tablets by mouth 2 (two) times daily.     nystatin (MYCOSTATIN/NYSTOP) powder Apply topically 3 (three) times daily. 15 g 0   rosuvastatin (CRESTOR) 40 MG tablet Take 40 mg by mouth daily.     sacubitril-valsartan (ENTRESTO) 24-26 MG Take 1 tablet by mouth every other day. 60 tablet 0   XARELTO 2.5 MG TABS tablet TAKE 1 TABLET BY MOUTH TWICE DAILY 60 tablet 1   No  current facility-administered medications on file prior to visit.     Review of Systems  Constitution: Negative for decreased appetite, malaise/fatigue, weight gain and weight loss.  Eyes: Negative for visual disturbance.  Cardiovascular: Positive for claudication, dyspnea on exertion (improved) and leg swelling (improved). Negative for chest pain, orthopnea, palpitations and syncope.  Respiratory: Positive for cough. Negative for hemoptysis and wheezing.   Endocrine: Negative for cold intolerance and heat intolerance.  Hematologic/Lymphatic: Does not bruise/bleed easily.  Skin: Negative for color change, nail changes and poor wound healing.  Musculoskeletal: Negative for muscle weakness and myalgias.  Gastrointestinal: Negative for abdominal pain, change in bowel habit, nausea and vomiting.  Neurological: Positive for dizziness. Negative for difficulty with concentration, focal weakness and headaches.  Psychiatric/Behavioral: Negative for altered mental status and suicidal ideas.  All other systems reviewed and are negative.     Objective  Blood pressure 114/70, pulse 72, height 5\' 7"  (1.702 m), weight 147 lb (66.7 kg), SpO2 97 %. Body mass index is 23.02 kg/m.    Physical Exam  Constitutional: He is oriented to person, place, and time. Vital signs are normal. He appears well-developed and well-nourished.  HENT:  Head: Normocephalic and atraumatic.  Neck: Normal range of motion. Neck supple.  Cardiovascular: Normal rate, regular rhythm, normal heart sounds and intact distal pulses.  Pulses:      Femoral pulses are 2+ on the right side and 1+ on the left side.      Popliteal pulses are 1+ on the right side and 1+ on the left side.       Dorsalis pedis pulses are 1+ on the right side and 1+ on the left side.       Posterior tibial pulses are 0 on  the right side and 0 on the left side.  Trace edema Warm bilateral LE DP 0.5 bilateral Popliteal 0.5   Pulmonary/Chest: Effort normal.  No accessory muscle usage. No respiratory distress. He has wheezes. He has rhonchi.  Abdominal: Soft. Bowel sounds are normal.  Musculoskeletal: Normal range of motion.  Neurological: He is alert and oriented to person, place, and time.  Skin: Skin is warm and dry.  Psychiatric: He has a normal mood and affect. His behavior is normal.  Vitals reviewed.  Radiology:  CT of abdomen and pelvis 12/04/2017:  1. No acute abnormality. 2. Mild diffuse hepatic steatosis. 3. Cholelithiasis. 4. Stable small right adrenal adenoma. 5. Small midline ventral hernia containing fat. 6. Atheromatous arterial calcifications, including the coronary arteries. 7. Interval 4.3 cm infrarenal abdominal aortic aneurysm at the location of the proximal portion of an aortobifemoral bypass graft. Recommend followup by ultrasound in 1 year. This recommendation follows ACR consensus guidelines: White Paper of the ACR Incidental Findings Committee II on Vascular Findings. J Am Coll Radiol 2013; 10:789-794. 8. 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 9. Small midline ventral hernia containing fat.  CTA of chest 09/21/2013: There is stable dilatation of the ascending thoracic aorta. The maximal diameter at the level of the sinuses of Valsalva is 4.3 cm. The proximal ascending thoracic aorta measures 4.2 cm. The proximal arch measures 3.8 cm. The distal arch measures 3.3 cm. The descending thoracic aorta measures 2.7 cm. The patient is status post CABG. There is no evidence of aortic dissection.  CTA of chest 2014: 1.  No evidence of pulmonary embolism. 2.  COPD/emphysema.  Scarring in the lower lobes, right middle lobe, and lingula.  No acute cardiopulmonary disease. 3.  Ectatic ascending thoracic aorta measuring 4.5 cm diameter, unchanged since February, 2012.  MRI/MRA of brain 08/23/2013:  1. Small but patent right vertebral artery to the level of the right PICA. 2.  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 There is flow within the posterior cerebral arteries bilaterally, likely via collateral circulation from the anterior circulation. Overall, these findings are not significantly changed relative to previous MRA from 10/10/2009. MRA: 1. Small but patent right vertebral artery to the level of the right PICA. 2. 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 There is flow within the posterior cerebral arteries bilaterally, likely via collateral circulation from the anterior circulation. Overall, these findings are not significantly changed relative to previous MRA from 10/10/2009. 1. No acute intracranial infarct or other abnormality identified within the brain. 2. Age-related atrophy with moderate chronic microvascular ischemic disease involving the supratentorial white matter, advanced relative to prior MRI from 10/10/2009. 3. Remote pontine, left frontal lobe, right basal ganglia, and left cerebellar infarcts.  Laboratory examination:    CMP Latest Ref Rng & Units 11/04/2018 10/19/2018 10/18/2018  Glucose 65 - 99 mg/dL 182(H) 115(H) 114(H)  BUN 8 - 27 mg/dL 15 15 14   Creatinine 0.76 - 1.27 mg/dL 1.38(H) 1.17 1.02  Sodium 134 - 144 mmol/L 142 142 140  Potassium 3.5 - 5.2 mmol/L 4.5 3.7 3.0(L)  Chloride 96 - 106 mmol/L 100 104 101  CO2 20 - 29 mmol/L 23 31 29   Calcium 8.6 - 10.2 mg/dL 9.5 9.1 9.0  Total Protein 6.5 - 8.1 g/dL - - -  Total Bilirubin 0.3 - 1.2 mg/dL - - -  Alkaline Phos 38 - 126 U/L - - -  AST 15 - 41 U/L - - -  ALT 0 - 44 U/L - - -   CBC Latest Ref Rng & Units 10/17/2018 05/22/2018 05/21/2018  WBC 4.0 - 10.5 K/uL 9.6 6.2 10.0  Hemoglobin 13.0 - 17.0 g/dL 16.7 15.1 16.8  Hematocrit 39.0 - 52.0 % 50.8 47.2 52.7(H)  Platelets 150 - 400 K/uL 181 213 201   Lipid Panel     Component Value Date/Time   CHOL 206 (H) 05/24/2015 1149   TRIG 120 05/24/2015  1149   HDL 80 05/24/2015 1149   CHOLHDL 2.6 05/24/2015 1149   VLDL 24 05/24/2015 1149   LDLCALC 102 05/24/2015 1149   HEMOGLOBIN A1C Lab Results  Component Value Date   HGBA1C 5.4 05/24/2015   MPG 108 05/24/2015   TSH No results for input(s): TSH in the last 8760 hours.  Cardiac Studies:   Carotid artery duplex  10/04/2018: Minimal stenosis in the right carotid bulb with heterogeneous plaque.  Stenosis in the left internal carotid artery (50-69%). Antegrade right vertebral artery flow. Antegrade left vertebral artery flow.  Follow up in six months is appropriate if clinically indicated.  Abdominal Aortic Duplex  10/04/2018: Moderate dilatation of the abdominal aorta is noted in the mid and distal aorta, aortic aneurysm measuring 3.51 x 3.5 x 3.5 cm is seen.  Diffuse heterogeneous plaque noted. Iliac arteries not well visualized.  Recheck in 1 year.   [place] Stress Test 09/23/2018: Nondiagnostic ECG stress due to pharmacologic stress. Perfusion images reveal a moderate to large sized inferior and inferolateral transmural scar with minimal peri-infarct ischemia.  Gated SPECT images reveal inferior wall akinesis, LVEF mildly depressed at 41%. intermediate risk study.  Lower Extremity Arterial Duplex 09/22/2018: Right lower extremity demonstrates diffuse mixed plaque throughout. There is moderately abnormal waveform throughout the right lower extremity suggestive of moderate to severe diffuse disease. Right PFA has >50% stenosis, probably > 75%  stenosis. Cannot exclude inflow disease (right CIA).  lLeft ower extremity demonstrates diffuse mixed plaque throughout.  Left CIA stenosis of <50%. Distal left SFA >50% stenosis.  Moderately abnormal PVR waveforms of the right ankle. Moderately abnormal PVR waveforms of the left ankle.  Mildly decreased right resting ABI at 0.85 and m moderately decreased left resting ABI 0.77.   Echocardiogram 09/22/2018: Moderately depressed LV  systolic function with EF 40%. Left ventricle cavity is normal in size. Mild concentric hypertrophy of the left ventricle. Mild global hypokinesis. Abnormal septal wall motion due to post-operative coronary artery bypass graft. Doppler evidence of grade I (impaired) diastolic dysfunction, normal LAP. Calculated EF 40%. Mild (Grade I) aortic regurgitation. The aortic root is mildly dilated at 4.0 cm.  Assessment   No diagnosis found.  EKG 10/26/2018: Sinus bradycardia at 58 bpm, left atrial enlargement, diffuse nonspecific T wave abnormality.    Recommendations:   ALTON BOUKNIGHT  is a 68 y.o. male  with COPD, CAD s/p CABG by Dr. Roxan Hockey in 2012 (patent LIMA-LAD, patent SVG-ramus, known occluded SVG-diagonal, and SVG-RCA as of 2015), HTN, HLD, Hx CVA, PAD, AAA s/p repair in 2011 using Dacron graft with aortobifemoral graft goal-2011, and former tobacco use.  Patient is overall doing well, has had some improvement with home health and being more active. Dyspnea and claudication have been stable. No evidence of acute decompensated heart failure. Entresto dose was reduced at his last office visit due to hypotension. BP is stable today. He does mention occasional dizziness, I have asked him to note if this is happening  on days that he is taking Entresto. Would like to continue with Entresto if possible in view of CHF.   He will need BMP performed to follow up on kidney function. Leg edema likely multifactoral, has improved with support stockings, urged him to continue with this.   Will continue with medical management for PAD, unless he develops ulceration or evidence of ischemia. We will see him back in 6 weeks for continued close monitoring.     Miquel Dunn, MSN, APRN, FNP-C [facility]. [place] Office: [phone] Fax: [phone]

## 2018-12-06 DIAGNOSIS — Z1329 Encounter for screening for other suspected endocrine disorder: Secondary | ICD-10-CM | POA: Diagnosis not present

## 2018-12-06 DIAGNOSIS — Z01021 Encounter for examination of eyes and vision following failed vision screening with abnormal findings: Secondary | ICD-10-CM | POA: Diagnosis not present

## 2018-12-06 DIAGNOSIS — I10 Essential (primary) hypertension: Secondary | ICD-10-CM | POA: Diagnosis not present

## 2018-12-06 DIAGNOSIS — Z0001 Encounter for general adult medical examination with abnormal findings: Secondary | ICD-10-CM | POA: Diagnosis not present

## 2018-12-06 DIAGNOSIS — E1165 Type 2 diabetes mellitus with hyperglycemia: Secondary | ICD-10-CM | POA: Diagnosis not present

## 2018-12-07 DIAGNOSIS — E119 Type 2 diabetes mellitus without complications: Secondary | ICD-10-CM | POA: Diagnosis not present

## 2018-12-15 ENCOUNTER — Other Ambulatory Visit: Payer: Self-pay

## 2018-12-15 NOTE — Patient Outreach (Signed)
Telephone assessment:  Placed follow up call to patient without success. Left a message requesting a call back.  PLAN: will attempt again on 12/19/2018 if no return call.  Tomasa Rand, RN, BSN, CEN [facility] [employer] [phone]

## 2018-12-19 ENCOUNTER — Encounter: Payer: Medicare Other | Admitting: Surgery

## 2018-12-19 ENCOUNTER — Other Ambulatory Visit: Payer: Self-pay

## 2018-12-19 NOTE — Patient Outreach (Addendum)
[facility]) Care Management  12/19/2018  Chris Payne Oct 02, 1950 [id number]   Telephone assessment:  Placed call to patient who answered and identified himself.  Patient reports that he is doing well. Reports his breathing is good. Reports decrease in swelling since using compression hose. Reports that he is trying to walk more.  Reports MD office is working on getting his new walker.  PLAN: follow up in 2 weeks. Tomasa Rand, RN, BSN, CEN [facility] [employer] [phone]

## 2018-12-21 ENCOUNTER — Encounter: Payer: Self-pay | Admitting: Family

## 2018-12-29 ENCOUNTER — Other Ambulatory Visit: Payer: Self-pay

## 2018-12-29 NOTE — Patient Outreach (Signed)
Case closure telephone call:  Placed call to patient who reports that he is doing well.  Reports today's weight of 174 pounds. Denies shortness of breath, denies swelling. Reports wearing his compression hose as directed.  Patient reports eye appointment next week. Reports follow up needed with ortho to get his new walker.   Patient denies any new problems or concerns    Reviewed case closure and patient is in agreement.  Agreed to letter and he will call me if needed in the future.  Margie Billet CM Care Plan Problem One     Most Recent Value  Care Plan Problem One  Knowledge deficit related to CHF  Role Documenting the Problem One  Care Management Coordinator  Care Plan for Problem One  Active  [facility] Long Term Goal   Pt will verbalize/ demonstrate improved self care for CHF within 60 days  [facility] Long Term Goal Start Date  10/21/18  [facility] Long Term Goal Met Date  12/29/18  [facility] CM Short Term Goal #1   Pt will verbalize CHF action plan/ zones within 30 days  [facility] CM Short Term Goal #1 Start Date  10/21/18  [facility] CM Short Term Goal #1 Met Date  11/22/18  [facility] CM Short Term Goal #2   Pt will verbalize understanding of low sodium diet within 30 days  [facility] CM Short Term Goal #2 Start Date  10/21/18  [facility] CM Short Term Goal #2 Met Date  11/16/18    [facility] CM Care Plan Problem Two     Most Recent Value  Care Plan Problem Two  Knowledge deficit related to COPD  Role Documenting the Problem Two  Care Management Coordinator  Care Plan for Problem Two  Active  [facility] CM Short Term Goal #1   Pt will verbalize COPD action plan and improved self care for COPD within 30 days  [facility] CM Short Term Goal #1 Start Date  10/21/18  [facility] CM Short Term Goal #1 Met Date   11/22/18    Will send case closure to MD and patient.  Tomasa Rand, RN, BSN, CEN [facility] [employer] [phone]

## 2019-01-05 DIAGNOSIS — D492 Neoplasm of unspecified behavior of bone, soft tissue, and skin: Secondary | ICD-10-CM | POA: Diagnosis not present

## 2019-01-05 DIAGNOSIS — L720 Epidermal cyst: Secondary | ICD-10-CM | POA: Diagnosis not present

## 2019-01-11 ENCOUNTER — Other Ambulatory Visit: Payer: Self-pay

## 2019-01-11 NOTE — Patient Outreach (Signed)
[facility]) Care Management  01/11/2019  Chris Payne 12/14/1950 [id number]   Medication Adherence call to [name] [facility] Identifiers Verify spoke with patient he is past due on Metformin 500 mg patient explain he is no longer taking this medication doctor took him off.[name] is showing past due under [name].   [facility] Management Direct Dial [phone]  Fax [phone] [name]

## 2019-01-25 ENCOUNTER — Encounter (HOSPITAL_COMMUNITY): Payer: Self-pay

## 2019-01-25 ENCOUNTER — Emergency Department (HOSPITAL_COMMUNITY)
Admission: EM | Admit: 2019-01-25 | Discharge: 2019-01-26 | Disposition: A | Discharge status: Home / Self Care | Payer: Medicare Other | Attending: Emergency Medicine | Admitting: Emergency Medicine

## 2019-01-25 ENCOUNTER — Other Ambulatory Visit: Payer: Self-pay

## 2019-01-25 DIAGNOSIS — R109 Unspecified abdominal pain: Secondary | ICD-10-CM | POA: Diagnosis present

## 2019-01-25 DIAGNOSIS — J449 Chronic obstructive pulmonary disease, unspecified: Secondary | ICD-10-CM | POA: Diagnosis not present

## 2019-01-25 DIAGNOSIS — Z7901 Long term (current) use of anticoagulants: Secondary | ICD-10-CM | POA: Insufficient documentation

## 2019-01-25 DIAGNOSIS — Z79899 Other long term (current) drug therapy: Secondary | ICD-10-CM | POA: Diagnosis not present

## 2019-01-25 DIAGNOSIS — R1084 Generalized abdominal pain: Secondary | ICD-10-CM | POA: Diagnosis not present

## 2019-01-25 DIAGNOSIS — N50812 Left testicular pain: Secondary | ICD-10-CM | POA: Insufficient documentation

## 2019-01-25 DIAGNOSIS — Z8673 Personal history of transient ischemic attack (TIA), and cerebral infarction without residual deficits: Secondary | ICD-10-CM | POA: Diagnosis not present

## 2019-01-25 DIAGNOSIS — K5900 Constipation, unspecified: Secondary | ICD-10-CM | POA: Insufficient documentation

## 2019-01-25 DIAGNOSIS — I110 Hypertensive heart disease with heart failure: Secondary | ICD-10-CM | POA: Insufficient documentation

## 2019-01-25 DIAGNOSIS — N50811 Right testicular pain: Secondary | ICD-10-CM | POA: Insufficient documentation

## 2019-01-25 DIAGNOSIS — I5022 Chronic systolic (congestive) heart failure: Secondary | ICD-10-CM | POA: Insufficient documentation

## 2019-01-25 DIAGNOSIS — R39198 Other difficulties with micturition: Secondary | ICD-10-CM | POA: Insufficient documentation

## 2019-01-25 DIAGNOSIS — R05 Cough: Secondary | ICD-10-CM | POA: Diagnosis not present

## 2019-01-25 DIAGNOSIS — Z7982 Long term (current) use of aspirin: Secondary | ICD-10-CM | POA: Insufficient documentation

## 2019-01-25 DIAGNOSIS — Z8679 Personal history of other diseases of the circulatory system: Secondary | ICD-10-CM

## 2019-01-25 DIAGNOSIS — Z7984 Long term (current) use of oral hypoglycemic drugs: Secondary | ICD-10-CM | POA: Insufficient documentation

## 2019-01-25 DIAGNOSIS — R52 Pain, unspecified: Secondary | ICD-10-CM | POA: Diagnosis not present

## 2019-01-25 DIAGNOSIS — R0602 Shortness of breath: Secondary | ICD-10-CM | POA: Diagnosis not present

## 2019-01-25 DIAGNOSIS — I10 Essential (primary) hypertension: Secondary | ICD-10-CM | POA: Diagnosis not present

## 2019-01-25 DIAGNOSIS — I252 Old myocardial infarction: Secondary | ICD-10-CM | POA: Insufficient documentation

## 2019-01-25 DIAGNOSIS — Z87891 Personal history of nicotine dependence: Secondary | ICD-10-CM | POA: Insufficient documentation

## 2019-01-25 DIAGNOSIS — K8020 Calculus of gallbladder without cholecystitis without obstruction: Secondary | ICD-10-CM | POA: Diagnosis not present

## 2019-01-25 DIAGNOSIS — I2510 Atherosclerotic heart disease of native coronary artery without angina pectoris: Secondary | ICD-10-CM | POA: Diagnosis not present

## 2019-01-25 LAB — CBC WITH DIFFERENTIAL/PLATELET
Abs Immature Granulocytes: 0.02 10*3/uL (ref 0.00–0.07)
Basophils Absolute: 0.1 10*3/uL (ref 0.0–0.1)
Basophils Relative: 1 %
Eosinophils Absolute: 0.2 10*3/uL (ref 0.0–0.5)
Eosinophils Relative: 2 %
HCT: 51.2 % (ref 39.0–52.0)
Hemoglobin: 15.9 g/dL (ref 13.0–17.0)
Immature Granulocytes: 0 %
Lymphocytes Relative: 22 %
Lymphs Abs: 2 10*3/uL (ref 0.7–4.0)
MCH: 33.3 pg (ref 26.0–34.0)
MCHC: 31.1 g/dL (ref 30.0–36.0)
MCV: 107.3 fL — ABNORMAL HIGH (ref 80.0–100.0)
Monocytes Absolute: 0.8 10*3/uL (ref 0.1–1.0)
Monocytes Relative: 9 %
Neutro Abs: 6.1 10*3/uL (ref 1.7–7.7)
Neutrophils Relative %: 66 %
Platelets: 192 10*3/uL (ref 150–400)
RBC: 4.77 MIL/uL (ref 4.22–5.81)
RDW: 12.3 % (ref 11.5–15.5)
WBC: 9.1 10*3/uL (ref 4.0–10.5)
nRBC: 0 % (ref 0.0–0.2)

## 2019-01-25 LAB — COMPREHENSIVE METABOLIC PANEL
ALT: 19 U/L (ref 0–44)
AST: 19 U/L (ref 15–41)
Albumin: 3.9 g/dL (ref 3.5–5.0)
Alkaline Phosphatase: 76 U/L (ref 38–126)
Anion gap: 11 (ref 5–15)
BUN: 15 mg/dL (ref 8–23)
CO2: 27 mmol/L (ref 22–32)
Calcium: 9.2 mg/dL (ref 8.9–10.3)
Chloride: 103 mmol/L (ref 98–111)
Creatinine, Ser: 1.06 mg/dL (ref 0.61–1.24)
GFR calc Af Amer: >60 mL/min (ref >60)
GFR calc non Af Amer: >60 mL/min (ref >60)
Glucose, Bld: 118 mg/dL — ABNORMAL HIGH (ref 70–99)
Potassium: 4.2 mmol/L (ref 3.5–5.1)
Sodium: 141 mmol/L (ref 135–145)
Total Bilirubin: 0.7 mg/dL (ref 0.3–1.2)
Total Protein: 7.1 g/dL (ref 6.5–8.1)

## 2019-01-25 LAB — URINALYSIS, ROUTINE W REFLEX MICROSCOPIC
Bilirubin Urine: NEGATIVE
Glucose, UA: NEGATIVE mg/dL
Hgb urine dipstick: NEGATIVE
Ketones, ur: NEGATIVE mg/dL
Leukocytes,Ua: NEGATIVE
Nitrite: NEGATIVE
Protein, ur: NEGATIVE mg/dL
Specific Gravity, Urine: 1.016 (ref 1.005–1.030)
pH: 6 (ref 5.0–8.0)

## 2019-01-25 MED ORDER — FENTANYL CITRATE (PF) 100 MCG/2ML IJ SOLN
50.0000 ug | Freq: Once | INTRAMUSCULAR | Status: AC
  Administered 2019-01-26: 50 ug via INTRAVENOUS
  Filled 2019-01-25: qty 2

## 2019-01-25 MED ORDER — ALBUTEROL SULFATE HFA 108 (90 BASE) MCG/ACT IN AERS: 2.0000 | INHALATION_SPRAY | Freq: Once | RESPIRATORY_TRACT | Status: DC

## 2019-01-25 NOTE — ED Provider Notes (Signed)
[facility] DEPT Provider Note   CSN: [id number] Arrival date & time: 01/25/19  1655     History   Chief Complaint Chief Complaint  Patient presents with  . Abdominal Pain    HPI Chris Payne is a 68 y.o. male.     Patient with a history of COPD, GERD, CAD, AAA (S/P repair 2011), CHF, HTN, HLD, MI, PAD, presents with complaint of abdominal and groin/testicular pain that he states has been going on for months but has been intense for the past 5 days. No fever, nausea, vomiting. He states his last bowel movement was yesterday but this was the result of using a suppository and has been having infrequent, hard to pass bowel movements over the last 1 week. He reports having difficulty urinating, described as difficult to empty his bladder when he feels he has to go, and sometimes losing control and urinating "all over myself". He is having pain now in the groin and reports swelling to bilateral groin. No hematuria.   The history is provided by the patient. No language interpreter was used.  Abdominal Pain Associated symptoms: constipation, cough and shortness of breath   Associated symptoms: no chest pain, no chills, no fever, no nausea and no vomiting     Past Medical History:  Diagnosis Date  . AAA (abdominal aortic aneurysm) ([place])    s/p repair 6/11  . CAD (coronary artery disease)    s/p CABG 2/12:  LIMA to LAD, SVG to diagonal-1, SVG to ramus intermedius,, SVG to AM (Dr. Roxan Hockey) ;  b.  Myoview 10/13:  inf infarct with very mild peri-infarct ischemia, EF 49%, inf HK; c  He has severe three-vessel native coronary artery disease. Catheterization March 2015 as above  . CAP (community acquired pneumonia) 02/23/2014  . Carotid stenosis    a.  dopplers 0000000: LICA 0000000;  b. Carotid dopplers 3/14:  R 0-39%, L 60-79%, repeat 6 mos  . CHF (congestive heart failure) ([place])   . COPD (chronic obstructive pulmonary disease) ([place])   . GERD  (gastroesophageal reflux disease)   . Headache(784.0)   . Hyperlipidemia   . Hypertension   . Hypoxia 02/23/2014  . Inguinal hernia   . Myocardial infarction [facility])    PCI x3  . PAD (peripheral artery disease) ([place])    ABIs 4/12:  R 0.88, L 0.92; R SFA 40%, L CFA 50%, L SFA 50-60%  . Pneumonia    hx  . Stroke [facility])    h/o pontine CVA  . Thyroid nodule    incidental finding on carotid doppler 3/14 => thyroid U/S ordered    Patient Active Problem List   Diagnosis Date Noted  . Candida rash of groin 10/19/2018  . Acute on chronic systolic heart failure ([place]) 10/17/2018  . Acute on chronic systolic (congestive) heart failure ([place]) 10/17/2018  . COPD exacerbation ([place]) 05/21/2018  . Chronic pain syndrome 07/19/2015  . Degenerative disc disease, lumbar 07/19/2015  . Weakness 04/17/2015  . Generalized weakness 04/17/2015  . Polycythemia 04/17/2015  . BPH (benign prostatic hyperplasia) 04/17/2015  . Seizure disorder ([place]) 10/12/2014  . TIA (transient ischemic attack) 08/23/2013  . Other and unspecified angina pectoris 07/04/2013  . Nontoxic multinodular goiter 10/07/2012  . Unstable angina ([place]) 06/19/2012  . Carotid stenosis   . PAD (peripheral artery disease) ([place])   . Left inguinal hernia 05/30/2012  . COPD (chronic obstructive pulmonary disease) ([place]) 08/24/2011  . S/P AAA repair 08/24/2011  . Lumbar disc  disease   . Hypertensive heart disease   . Hyperlipidemia   . CAD (coronary artery disease)   . History of pontine CVA   . GERD (gastroesophageal reflux disease)     Past Surgical History:  Procedure Laterality Date  . ABDOMINAL AORTIC ANEURYSM REPAIR  10-03-2009  . CORONARY ARTERY BYPASS GRAFT  06/24/2010   LIMA to the LAD, SVG to first diagonal, SVG to ramus intermediate, SVG to acute marginal. EF 50%. 2/12  . EXPLORATION POST OPERATIVE OPEN HEART    . EXPLORATORY LAPAROTOMY  09/2009   ligation of lumbar arteries  . EXTERNAL EAR SURGERY Left    laceration child  .  HIP SURGERY  40 years ago   left hip bone removal and pinning  . INGUINAL HERNIA REPAIR Left 07/21/2012   Procedure: HERNIA REPAIR INGUINAL ADULT;  Surgeon: Merrie Roof, MD;  Location: [facility];  Service: General;  Laterality: Left;  . INSERTION OF MESH Left 07/21/2012   Procedure: INSERTION OF MESH;  Surgeon: Merrie Roof, MD;  Location: [facility];  Service: General;  Laterality: Left;  . LEFT HEART CATHETERIZATION WITH CORONARY/GRAFT ANGIOGRAM  06/21/2012   Procedure: LEFT HEART CATHETERIZATION WITH Beatrix Fetters;  Surgeon: Minus Breeding, MD;  Location: [facility] CATH LAB;  Service: Cardiovascular;;  . LEFT HEART CATHETERIZATION WITH CORONARY/GRAFT ANGIOGRAM N/A 07/04/2013   Procedure: LEFT HEART CATHETERIZATION WITH Beatrix Fetters;  Surgeon: Sinclair Grooms, MD;  Location: [facility] CATH LAB;  Service: Cardiovascular;  Laterality: N/A;  . NM MYOCAR PERF WALL MOTION  12/02/2010   inferoapical defect is fixed c/w prior infarction/scar, there is minimal borderzone ischemia.        Home Medications    Prior to Admission medications   Medication Sig Start Date End Date Taking? Authorizing Provider  albuterol (PROVENTIL HFA;VENTOLIN HFA) 108 (90 Base) MCG/ACT inhaler Inhale 2 puffs into the lungs every 6 (six) hours as needed for wheezing or shortness of breath. 05/23/18   Geradine Girt, DO  aspirin EC 81 MG EC tablet Take 1 tablet (81 mg total) by mouth daily. 05/24/18   Geradine Girt, DO  budesonide-formoterol (SYMBICORT) 80-4.5 MCG/ACT inhaler Inhale 2 puffs into the lungs 2 (two) times daily.    [provider]  furosemide (LASIX) 20 MG tablet Take 20 mg by mouth daily.     [provider]  gabapentin (NEURONTIN) 300 MG capsule Take 300 mg by mouth 2 (two) times daily. 05/14/18   [provider]  metFORMIN (GLUCOPHAGE) 500 MG tablet Take 0.5 tablets by mouth 2 (two) times daily. 08/27/18   [provider]  nystatin (MYCOSTATIN/NYSTOP) powder Apply  topically 3 (three) times daily. 10/19/18   Patwardhan, Reynold Bowen, MD  rosuvastatin (CRESTOR) 40 MG tablet Take 40 mg by mouth daily.    [provider]  sacubitril-valsartan (ENTRESTO) 24-26 MG Take 1 tablet by mouth every other day. 10/26/18   Miquel Dunn, NP  XARELTO 2.5 MG TABS tablet TAKE 1 TABLET BY MOUTH TWICE DAILY 12/01/18   Miquel Dunn, NP    Family History Family History  Problem Relation Age of Onset  . Alcohol abuse Father   . Cancer Sister 73       unknown primary  . Diabetes Mother   . Hypertension Mother   . Cancer Sister 42       kidney cancer    Social History Social History   Tobacco Use  . Smoking status: Former Smoker  Packs/day: 1.00    Years: 30.00    Pack years: 30.00    Types: Cigarettes    Quit date: 01/25/2010    Years since quitting: 9.0  . Smokeless tobacco: Never Used  . Tobacco comment: "havent smoked in 7 years"  Substance Use Topics  . Alcohol use: No    Comment: former drinker - Quit 2011.  . Drug use: No     Allergies   Patient has no known allergies.   Review of Systems Review of Systems  Constitutional: Negative for chills and fever.  HENT: Negative.   Respiratory: Positive for cough, shortness of breath and wheezing.        He states he has COPD and is out of his inhaler.  Cardiovascular: Negative.  Negative for chest pain.  Gastrointestinal: Positive for abdominal pain and constipation. Negative for nausea and vomiting.  Genitourinary: Positive for difficulty urinating and testicular pain.       See HPI.  Musculoskeletal: Negative.   Skin: Negative.   Neurological: Negative.      Physical Exam Updated Vital Signs BP (!) 186/101   Pulse 95   Temp 99.2 F (37.3 C) (Oral)   Resp 20   Ht 5\' 7"  (1.702 m)   Wt 79.4 kg   SpO2 92%   BMI 27.41 kg/m   Physical Exam Vitals signs and nursing note reviewed.  Constitutional:      Appearance: He is well-developed.  HENT:     Head:  Normocephalic.  Neck:     Musculoskeletal: Normal range of motion and neck supple.  Cardiovascular:     Rate and Rhythm: Normal rate and regular rhythm.     Heart sounds: No murmur.  Pulmonary:     Effort: Pulmonary effort is normal.     Breath sounds: Wheezing and rales present.  Chest:     Chest wall: No tenderness.  Abdominal:     General: Bowel sounds are normal. There is distension.     Palpations: Abdomen is soft.     Tenderness: There is no abdominal tenderness. There is no guarding or rebound.     Comments: There is a midline surgical scar. Abdomen is tight, generally tender. No appreciated hernia.   Genitourinary:    Penis: Normal.      Scrotum/Testes:        Right: Tenderness present. Mass or swelling not present.        Left: Tenderness present. Mass or swelling not present.  Musculoskeletal: Normal range of motion.  Skin:    General: Skin is warm and dry.  Neurological:     Mental Status: He is alert and oriented to person, place, and time.      ED Treatments / Results  Labs (all labs ordered are listed, but only abnormal results are displayed) Labs Reviewed  COMPREHENSIVE METABOLIC PANEL - Abnormal; Notable for the following components:      Result Value   Glucose, Bld 118 (*)    All other components within normal limits  CBC WITH DIFFERENTIAL/PLATELET - Abnormal; Notable for the following components:   MCV 107.3 (*)    All other components within normal limits  URINE CULTURE  URINALYSIS, ROUTINE W REFLEX MICROSCOPIC   Results for orders placed or performed during the hospital encounter of 01/25/19  Urinalysis, Routine w reflex microscopic- may I&O cath if menses  Result Value Ref Range   Color, Urine YELLOW YELLOW   APPearance CLEAR CLEAR   Specific Gravity, Urine 1.016 1.005 -  1.030   pH 6.0 5.0 - 8.0   Glucose, UA NEGATIVE NEGATIVE mg/dL   Hgb urine dipstick NEGATIVE NEGATIVE   Bilirubin Urine NEGATIVE NEGATIVE   Ketones, ur NEGATIVE NEGATIVE  mg/dL   Protein, ur NEGATIVE NEGATIVE mg/dL   Nitrite NEGATIVE NEGATIVE   Leukocytes,Ua NEGATIVE NEGATIVE  Comprehensive metabolic panel  Result Value Ref Range   Sodium 141 135 - 145 mmol/L   Potassium 4.2 3.5 - 5.1 mmol/L   Chloride 103 98 - 111 mmol/L   CO2 27 22 - 32 mmol/L   Glucose, Bld 118 (H) 70 - 99 mg/dL   BUN 15 8 - 23 mg/dL   Creatinine, Ser 1.06 0.61 - 1.24 mg/dL   Calcium 9.2 8.9 - 10.3 mg/dL   Total Protein 7.1 6.5 - 8.1 g/dL   Albumin 3.9 3.5 - 5.0 g/dL   AST 19 15 - 41 U/L   ALT 19 0 - 44 U/L   Alkaline Phosphatase 76 38 - 126 U/L   Total Bilirubin 0.7 0.3 - 1.2 mg/dL   GFR calc non Af Amer >60 >60 mL/min   GFR calc Af Amer >60 >60 mL/min   Anion gap 11 5 - 15  CBC with Differential  Result Value Ref Range   WBC 9.1 4.0 - 10.5 K/uL   RBC 4.77 4.22 - 5.81 MIL/uL   Hemoglobin 15.9 13.0 - 17.0 g/dL   HCT 51.2 39.0 - 52.0 %   MCV 107.3 (H) 80.0 - 100.0 fL   MCH 33.3 26.0 - 34.0 pg   MCHC 31.1 30.0 - 36.0 g/dL   RDW 12.3 11.5 - 15.5 %   Platelets 192 150 - 400 K/uL   nRBC 0.0 0.0 - 0.2 %   Neutrophils Relative % 66 %   Neutro Abs 6.1 1.7 - 7.7 K/uL   Lymphocytes Relative 22 %   Lymphs Abs 2.0 0.7 - 4.0 K/uL   Monocytes Relative 9 %   Monocytes Absolute 0.8 0.1 - 1.0 K/uL   Eosinophils Relative 2 %   Eosinophils Absolute 0.2 0.0 - 0.5 K/uL   Basophils Relative 1 %   Basophils Absolute 0.1 0.0 - 0.1 K/uL   Immature Granulocytes 0 %   Abs Immature Granulocytes 0.02 0.00 - 0.07 K/uL    EKG None  Radiology No results found. Ct Abdomen Pelvis W Contrast  Result Date: 01/26/2019 CLINICAL DATA:  Acute abdominal pain. Patient reports groin pain for 5 days. EXAM: CT ABDOMEN AND PELVIS WITH CONTRAST TECHNIQUE: Multidetector CT imaging of the abdomen and pelvis was performed using the standard protocol following bolus administration of intravenous contrast. CONTRAST:  135mL OMNIPAQUE IOHEXOL 300 MG/ML  SOLN COMPARISON:  CT 12/04/2017 FINDINGS: Lower chest:  Chronic bibasilar scarring, right greater than left with chronic bronchial thickening. Coronary artery calcifications post CABG. Hepatobiliary: Multiple cysts throughout the liver. No evidence of solid lesion. Lamellated gallstone in the gallbladder. No pericholecystic inflammation. No biliary dilatation. Pancreas: Fatty atrophy.  No ductal dilatation or inflammation. Spleen: Normal in size without focal abnormality. Adrenals/Urinary Tract: Stable 12 mm right adrenal nodule. Left adrenal gland is normal. No hydronephrosis or perinephric edema. Homogeneous renal enhancement with symmetric excretion on delayed phase imaging. Chronic cortical scarring in the lower left kidney with nonobstructing stone, unchanged. 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 Urinary bladder is physiologically distended without wall thickening. Stomach/Bowel: Nondistended stomach. No bowel obstruction, wall thickening, or inflammatory change. Normal appendix. Minor distal colonic diverticulosis without  diverticulitis. Vascular/Lymphatic: Aortic atherosclerosis. Aortobifem bypass graft which is patent. Focal right aortic aneurysmal dilatation superior to the graft measures 4.9 cm, previously 4.3 cm with partial thrombosis. This originates 3 mm inferior to the right renal artery takeoff. No periaortic stranding or inflammation. 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 Replaced right hepatic artery arising from the SMA which is patent. No enlarged lymph nodes in the abdomen or pelvis. Reproductive: Enlarged prostate gland spanning 5.8 cm transverse causing mass effect on the bladder base. Other: No free air, free fluid, or intra-abdominal fluid collection. Tiny fat containing umbilical hernia. Musculoskeletal: Degenerative and postsurgical change of the left hip. Degenerative  change throughout spine. IMPRESSION: 1. No acute abnormality. 2. Prior aortobifem graft. Asymmetric partially thrombosis right lateral aortic aneurysmal dilatation proximal graft measures 4.9 cm, previously 4.3 cm. No evidence of rupture or inflammatory change. Given interval enlargement over a year, recommend vascular surgery consultation. 3. Gallstone without CT findings of acute cholecystitis. 4. Chronic findings include celiac artery aneurysm with chronic dissection, small fat containing umbilical hernia, and enlarged prostate gland causing mass effect on the bladder base. 5. Additional incidental findings as described. Aortic Atherosclerosis (ICD10-I70.0). Electronically Signed   By: Keith Rake M.D.   On: 01/26/2019 03:32    Procedures Procedures (including critical care time)  Medications Ordered in ED Medications  fentaNYL (SUBLIMAZE) injection 50 mcg (has no administration in time range)     Initial Impression / Assessment and Plan / ED Course  I have reviewed the triage vital signs and the nursing notes.  Pertinent labs & imaging results that were available during my care of the patient were reviewed by me and considered in my medical decision making (see chart for details).        Patient to ED with c/o abdominal pain, groin pain, constipation, difficulty urinating. He denies N, V, fever.   The patient is a difficult historian in that details are hard to definitively elicit. Duration of symptoms was stated as "months" as well as "5 days". He has generalized abdominal and groin pain with constipation, previous abdominal surgeries, difficulty urinating. Labs are normal without indication of acute infection (also no fever), however, based on tender abdomen and risk factors, will obtain bladder scan to eval for retention and CT to r/o abdominal process.   11:20 - bladder scan showing 130 cc urine making retention less likely. CT delayed due to poor venous access. When resulted,  there is finding of an increase to a aortic aneurysmal dilatation previously measuring 4.3 cm, now measuring 4.9 cm. No evidence of rupture. Vascular surgery consulted to recommend follow up care.   Re-examination finds the patient feeling much better. No abdominal tenderness to palpation now.   Discussed abnormal CT with regard to enlarging aortic dilatation with Dr. Trula Slade. Feel, based on information provided, that he is appropriate to discharge home and have office follow up in 1-2 weeks.   Patient is updated on information. Will discharge home with primary care referral for recheck of abdominal pain (presenting complaint) that is not felt to be related to enlarging aneurysm. Will refer to Dr. Trula Slade as discussed above.   Final Clinical Impressions(s) / ED Diagnoses   Final diagnoses:  None   1. Abdominal pain, resolved 2. AAA, 4.9 cm  ED Discharge Orders    None       Charlann Lange, [place] 01/26/19 [id number]  Maudie Flakes, MD 01/29/19 [phone]

## 2019-01-25 NOTE — ED Triage Notes (Signed)
Pt arrived EMS c/o 7/10 groin pain. Patient has history of prostate enlargement and has been having this pain for the past 5 days. Pt states that he has had this issue once before about 5 years ago. Pt denies dysuria. Hx of HTN (have not taken meds for 2 weeks) and COPD.

## 2019-01-25 NOTE — ED Notes (Signed)
Bladder scan - 159ml

## 2019-01-25 NOTE — ED Notes (Signed)
PT ENDORSES HE LAST URINATED AT 1500 HOWEVER WAS SMALL AMOUNT. YELLOW WITH BURNT SMELL. TIME BEFORE THAT WAS AT NOON. HE HAS NOT IMMEDIATE NEED TO VOID AT THIS TIME.

## 2019-01-26 ENCOUNTER — Encounter (HOSPITAL_COMMUNITY): Payer: Self-pay

## 2019-01-26 ENCOUNTER — Emergency Department (HOSPITAL_COMMUNITY): Payer: Medicare Other

## 2019-01-26 DIAGNOSIS — K8020 Calculus of gallbladder without cholecystitis without obstruction: Secondary | ICD-10-CM | POA: Diagnosis not present

## 2019-01-26 LAB — URINE CULTURE: Culture: NO GROWTH

## 2019-01-26 MED ORDER — SODIUM CHLORIDE (PF) 0.9 % IJ SOLN
INTRAMUSCULAR | Status: AC
  Administered 2019-01-26: 03:00:00
  Filled 2019-01-26: qty 50

## 2019-01-26 MED ORDER — IOHEXOL 300 MG/ML  SOLN
100.0000 mL | Freq: Once | INTRAMUSCULAR | Status: AC | PRN
  Administered 2019-01-26: 03:00:00 100 mL via INTRAVENOUS

## 2019-01-26 NOTE — ED Notes (Signed)
Spoke with Ms. Ariaz, at [phone], and told her of the patient's DC, she said she will call when someone is on the way to pick him up.

## 2019-01-26 NOTE — Discharge Instructions (Addendum)
Return to the emergency department if you have any further abdominal pain, high fever or new concern.

## 2019-01-30 ENCOUNTER — Other Ambulatory Visit: Payer: Self-pay | Admitting: Cardiology

## 2019-01-30 DIAGNOSIS — I739 Peripheral vascular disease, unspecified: Secondary | ICD-10-CM

## 2019-01-30 DIAGNOSIS — I25728 Atherosclerosis of autologous artery coronary artery bypass graft(s) with other forms of angina pectoris: Secondary | ICD-10-CM

## 2019-02-21 ENCOUNTER — Other Ambulatory Visit: Payer: Self-pay

## 2019-02-21 DIAGNOSIS — I714 Abdominal aortic aneurysm, without rupture, unspecified: Secondary | ICD-10-CM

## 2019-02-24 ENCOUNTER — Telehealth (HOSPITAL_COMMUNITY): Payer: Self-pay

## 2019-02-24 NOTE — Telephone Encounter (Signed)

## 2019-02-27 ENCOUNTER — Ambulatory Visit (HOSPITAL_COMMUNITY): Payer: Medicare Other

## 2019-02-27 ENCOUNTER — Encounter: Payer: Medicare Other | Admitting: Surgery

## 2019-02-27 ENCOUNTER — Ambulatory Visit: Payer: Medicare Other | Admitting: Surgery

## 2019-06-20 DIAGNOSIS — I2510 Atherosclerotic heart disease of native coronary artery without angina pectoris: Secondary | ICD-10-CM | POA: Diagnosis not present

## 2019-06-20 DIAGNOSIS — E1165 Type 2 diabetes mellitus with hyperglycemia: Secondary | ICD-10-CM | POA: Diagnosis not present

## 2019-06-20 DIAGNOSIS — I10 Essential (primary) hypertension: Secondary | ICD-10-CM | POA: Diagnosis not present

## 2019-06-20 DIAGNOSIS — Z23 Encounter for immunization: Secondary | ICD-10-CM | POA: Diagnosis not present

## 2019-06-20 DIAGNOSIS — Z87891 Personal history of nicotine dependence: Secondary | ICD-10-CM | POA: Diagnosis not present

## 2019-06-20 DIAGNOSIS — E782 Mixed hyperlipidemia: Secondary | ICD-10-CM | POA: Diagnosis not present

## 2019-07-03 DIAGNOSIS — L218 Other seborrheic dermatitis: Secondary | ICD-10-CM | POA: Diagnosis not present

## 2019-07-25 DIAGNOSIS — Z1329 Encounter for screening for other suspected endocrine disorder: Secondary | ICD-10-CM | POA: Diagnosis not present

## 2019-07-25 DIAGNOSIS — I10 Essential (primary) hypertension: Secondary | ICD-10-CM | POA: Diagnosis not present

## 2019-07-25 DIAGNOSIS — Z0001 Encounter for general adult medical examination with abnormal findings: Secondary | ICD-10-CM | POA: Diagnosis not present

## 2019-07-25 DIAGNOSIS — E1165 Type 2 diabetes mellitus with hyperglycemia: Secondary | ICD-10-CM | POA: Diagnosis not present

## 2019-07-25 DIAGNOSIS — Z131 Encounter for screening for diabetes mellitus: Secondary | ICD-10-CM | POA: Diagnosis not present

## 2019-07-25 DIAGNOSIS — J449 Chronic obstructive pulmonary disease, unspecified: Secondary | ICD-10-CM | POA: Diagnosis not present

## 2019-07-25 DIAGNOSIS — Z136 Encounter for screening for cardiovascular disorders: Secondary | ICD-10-CM | POA: Diagnosis not present

## 2019-07-25 NOTE — Patient Outreach (Signed)
Late entry:   Chart entry for case closure. Added program that was closed.   Tomasa Rand, RN, BSN, CEN [facility] [employer] [phone]

## 2019-09-07 DIAGNOSIS — J449 Chronic obstructive pulmonary disease, unspecified: Secondary | ICD-10-CM | POA: Diagnosis not present

## 2019-09-07 DIAGNOSIS — E782 Mixed hyperlipidemia: Secondary | ICD-10-CM | POA: Diagnosis not present

## 2019-09-07 DIAGNOSIS — I2510 Atherosclerotic heart disease of native coronary artery without angina pectoris: Secondary | ICD-10-CM | POA: Diagnosis not present

## 2019-09-07 DIAGNOSIS — Z0001 Encounter for general adult medical examination with abnormal findings: Secondary | ICD-10-CM | POA: Diagnosis not present

## 2019-09-07 DIAGNOSIS — E1165 Type 2 diabetes mellitus with hyperglycemia: Secondary | ICD-10-CM | POA: Diagnosis not present

## 2019-09-07 DIAGNOSIS — I10 Essential (primary) hypertension: Secondary | ICD-10-CM | POA: Diagnosis not present

## 2019-10-06 ENCOUNTER — Other Ambulatory Visit: Payer: Self-pay

## 2019-10-06 ENCOUNTER — Emergency Department (HOSPITAL_COMMUNITY)
Admission: EM | Admit: 2019-10-06 | Discharge: 2019-10-06 | Disposition: A | Discharge status: Home / Self Care | Payer: Medicare Other | Attending: Emergency Medicine | Admitting: Emergency Medicine

## 2019-10-06 DIAGNOSIS — Z951 Presence of aortocoronary bypass graft: Secondary | ICD-10-CM | POA: Insufficient documentation

## 2019-10-06 DIAGNOSIS — I110 Hypertensive heart disease with heart failure: Secondary | ICD-10-CM | POA: Insufficient documentation

## 2019-10-06 DIAGNOSIS — Z7901 Long term (current) use of anticoagulants: Secondary | ICD-10-CM | POA: Diagnosis not present

## 2019-10-06 DIAGNOSIS — G4489 Other headache syndrome: Secondary | ICD-10-CM | POA: Diagnosis not present

## 2019-10-06 DIAGNOSIS — Z7982 Long term (current) use of aspirin: Secondary | ICD-10-CM | POA: Insufficient documentation

## 2019-10-06 DIAGNOSIS — Z7984 Long term (current) use of oral hypoglycemic drugs: Secondary | ICD-10-CM | POA: Diagnosis not present

## 2019-10-06 DIAGNOSIS — I2510 Atherosclerotic heart disease of native coronary artery without angina pectoris: Secondary | ICD-10-CM | POA: Insufficient documentation

## 2019-10-06 DIAGNOSIS — Z79899 Other long term (current) drug therapy: Secondary | ICD-10-CM | POA: Diagnosis not present

## 2019-10-06 DIAGNOSIS — R39198 Other difficulties with micturition: Secondary | ICD-10-CM | POA: Diagnosis not present

## 2019-10-06 DIAGNOSIS — I5022 Chronic systolic (congestive) heart failure: Secondary | ICD-10-CM | POA: Diagnosis not present

## 2019-10-06 DIAGNOSIS — R1032 Left lower quadrant pain: Secondary | ICD-10-CM | POA: Diagnosis not present

## 2019-10-06 DIAGNOSIS — Z87891 Personal history of nicotine dependence: Secondary | ICD-10-CM | POA: Diagnosis not present

## 2019-10-06 DIAGNOSIS — R1084 Generalized abdominal pain: Secondary | ICD-10-CM | POA: Diagnosis not present

## 2019-10-06 DIAGNOSIS — Z760 Encounter for issue of repeat prescription: Secondary | ICD-10-CM | POA: Insufficient documentation

## 2019-10-06 DIAGNOSIS — R0789 Other chest pain: Secondary | ICD-10-CM | POA: Diagnosis not present

## 2019-10-06 DIAGNOSIS — B372 Candidiasis of skin and nail: Secondary | ICD-10-CM | POA: Insufficient documentation

## 2019-10-06 DIAGNOSIS — J449 Chronic obstructive pulmonary disease, unspecified: Secondary | ICD-10-CM | POA: Insufficient documentation

## 2019-10-06 DIAGNOSIS — R0602 Shortness of breath: Secondary | ICD-10-CM | POA: Diagnosis not present

## 2019-10-06 DIAGNOSIS — R1031 Right lower quadrant pain: Secondary | ICD-10-CM | POA: Diagnosis not present

## 2019-10-06 DIAGNOSIS — R079 Chest pain, unspecified: Secondary | ICD-10-CM | POA: Diagnosis not present

## 2019-10-06 DIAGNOSIS — R1030 Lower abdominal pain, unspecified: Secondary | ICD-10-CM | POA: Diagnosis present

## 2019-10-06 DIAGNOSIS — L304 Erythema intertrigo: Secondary | ICD-10-CM | POA: Diagnosis not present

## 2019-10-06 LAB — CBC WITH DIFFERENTIAL/PLATELET
Abs Immature Granulocytes: 0.01 10*3/uL (ref 0.00–0.07)
Basophils Absolute: 0 10*3/uL (ref 0.0–0.1)
Basophils Relative: 0 %
Eosinophils Absolute: 0.3 10*3/uL (ref 0.0–0.5)
Eosinophils Relative: 4 %
HCT: 53.1 % — ABNORMAL HIGH (ref 39.0–52.0)
Hemoglobin: 17 g/dL (ref 13.0–17.0)
Immature Granulocytes: 0 %
Lymphocytes Relative: 25 %
Lymphs Abs: 1.7 10*3/uL (ref 0.7–4.0)
MCH: 33.1 pg (ref 26.0–34.0)
MCHC: 32 g/dL (ref 30.0–36.0)
MCV: 103.3 fL — ABNORMAL HIGH (ref 80.0–100.0)
Monocytes Absolute: 0.7 10*3/uL (ref 0.1–1.0)
Monocytes Relative: 11 %
Neutro Abs: 4 10*3/uL (ref 1.7–7.7)
Neutrophils Relative %: 60 %
Platelets: 212 10*3/uL (ref 150–400)
RBC: 5.14 MIL/uL (ref 4.22–5.81)
RDW: 12.7 % (ref 11.5–15.5)
WBC: 6.8 10*3/uL (ref 4.0–10.5)
nRBC: 0 % (ref 0.0–0.2)

## 2019-10-06 LAB — COMPREHENSIVE METABOLIC PANEL
ALT: 17 U/L (ref 0–44)
AST: 23 U/L (ref 15–41)
Albumin: 3.6 g/dL (ref 3.5–5.0)
Alkaline Phosphatase: 84 U/L (ref 38–126)
Anion gap: 10 (ref 5–15)
BUN: 13 mg/dL (ref 8–23)
CO2: 25 mmol/L (ref 22–32)
Calcium: 9.3 mg/dL (ref 8.9–10.3)
Chloride: 105 mmol/L (ref 98–111)
Creatinine, Ser: 1.54 mg/dL — ABNORMAL HIGH (ref 0.61–1.24)
GFR calc Af Amer: 52 mL/min — ABNORMAL LOW (ref >60)
GFR calc non Af Amer: 45 mL/min — ABNORMAL LOW (ref >60)
Glucose, Bld: 74 mg/dL (ref 70–99)
Potassium: 3.9 mmol/L (ref 3.5–5.1)
Sodium: 140 mmol/L (ref 135–145)
Total Bilirubin: 1 mg/dL (ref 0.3–1.2)
Total Protein: 7.4 g/dL (ref 6.5–8.1)

## 2019-10-06 LAB — URINALYSIS, ROUTINE W REFLEX MICROSCOPIC
Bilirubin Urine: NEGATIVE
Glucose, UA: NEGATIVE mg/dL
Ketones, ur: NEGATIVE mg/dL
Leukocytes,Ua: NEGATIVE
Nitrite: NEGATIVE
Protein, ur: 100 mg/dL — AB
Specific Gravity, Urine: 1.004 — ABNORMAL LOW (ref 1.005–1.030)
pH: 7 (ref 5.0–8.0)

## 2019-10-06 MED ORDER — CLOTRIMAZOLE 1 % EX CREA: TOPICAL_CREAM | CUTANEOUS | 0 refills | Status: DC

## 2019-10-06 MED ORDER — FUROSEMIDE 20 MG PO TABS: 20.0000 mg | ORAL_TABLET | Freq: Every day | ORAL | 0 refills | Status: DC

## 2019-10-06 MED ORDER — BUDESONIDE-FORMOTEROL FUMARATE 80-4.5 MCG/ACT IN AERO: 2.0000 | INHALATION_SPRAY | Freq: Two times a day (BID) | RESPIRATORY_TRACT | 0 refills | Status: DC

## 2019-10-06 NOTE — ED Triage Notes (Signed)
Patient comes from home via [facility] with complaints of bilateral groin pain and bruising for the past six months. He also has complaints of painful urination for the past 3-4 months.  He has a history of COPD, herniated lumbar disc, and asthma with no use of his albuterol for the past week due to running out of his medication.  138/80 62 Hr 16RR 95% RA 98.2  131 CBG

## 2019-10-06 NOTE — ED Provider Notes (Signed)
[facility] EMERGENCY DEPARTMENT Provider Note   CSN: [id number] Arrival date & time: 10/06/19  1140     History Chief Complaint  Patient presents with  . Groin Pain    Chris Payne is a 69 y.o. male with history of AAA status post repair in 2011, CAD, COPD, CHF, GERD, hypertension, hyperlipidemia, BPH presents for evaluation of acute onset, persistent bilateral groin pain for several months as well as difficulty urinating for several months as well.  He reports burning pain to the groin, worsens with palpation and certain movements.  He reports some dysuria for the last several months and also notes difficulty starting his stream and feeling as though he is not completely voiding his bladder when he urinates.  He states that he has been getting up to urinate more throughout the night, up to 4 times a night and states that he is not able to mass much urine when he does so.  He feels as though his scrotum is mildly edematous.  Denies testicular pain, pain with bowel movements, or penile pain or urethral drainage.  He notes some lower abdominal pressure at times.  Denies nausea, vomiting, fevers, chest pain or shortness of breath.  He states he has been out of his Symbicort for 1 week.  He has chronic bilateral lower extremity edema which he reports is at baseline.  He has not tried anything for his symptoms.  The history is provided by the patient.       Past Medical History:  Diagnosis Date  . AAA (abdominal aortic aneurysm) ([place])    s/p repair 6/11  . CAD (coronary artery disease)    s/p CABG 2/12:  LIMA to LAD, SVG to diagonal-1, SVG to ramus intermedius,, SVG to AM (Dr. Roxan Hockey) ;  b.  Myoview 10/13:  inf infarct with very mild peri-infarct ischemia, EF 49%, inf HK; c  He has severe three-vessel native coronary artery disease. Catheterization March 2015 as above  . CAP (community acquired pneumonia) 02/23/2014  . Carotid stenosis    a.  dopplers 4/31: LICA  54-00%;  b. Carotid dopplers 3/14:  R 0-39%, L 60-79%, repeat 6 mos  . CHF (congestive heart failure) ([place])   . COPD (chronic obstructive pulmonary disease) ([place])   . GERD (gastroesophageal reflux disease)   . Headache(784.0)   . Hyperlipidemia   . Hypertension   . Hypoxia 02/23/2014  . Inguinal hernia   . Myocardial infarction [facility])    PCI x3  . PAD (peripheral artery disease) ([place])    ABIs 4/12:  R 0.88, L 0.92; R SFA 40%, L CFA 50%, L SFA 50-60%  . Pneumonia    hx  . Stroke [facility])    h/o pontine CVA  . Thyroid nodule    incidental finding on carotid doppler 3/14 => thyroid U/S ordered    Patient Active Problem List   Diagnosis Date Noted  . Candida rash of groin 10/19/2018  . Acute on chronic systolic heart failure ([place]) 10/17/2018  . Acute on chronic systolic (congestive) heart failure ([place]) 10/17/2018  . COPD exacerbation ([place]) 05/21/2018  . Chronic pain syndrome 07/19/2015  . Degenerative disc disease, lumbar 07/19/2015  . Weakness 04/17/2015  . Generalized weakness 04/17/2015  . Polycythemia 04/17/2015  . BPH (benign prostatic hyperplasia) 04/17/2015  . Seizure disorder ([place]) 10/12/2014  . TIA (transient ischemic attack) 08/23/2013  . Other and unspecified angina pectoris 07/04/2013  . Nontoxic multinodular goiter 10/07/2012  . Unstable angina ([place]) 06/19/2012  .  Carotid stenosis   . PAD (peripheral artery disease) ([place])   . Left inguinal hernia 05/30/2012  . COPD (chronic obstructive pulmonary disease) ([place]) 08/24/2011  . S/P AAA repair 08/24/2011  . Lumbar disc disease   . Hypertensive heart disease   . Hyperlipidemia   . CAD (coronary artery disease)   . History of pontine CVA   . GERD (gastroesophageal reflux disease)     Past Surgical History:  Procedure Laterality Date  . ABDOMINAL AORTIC ANEURYSM REPAIR  10-03-2009  . CORONARY ARTERY BYPASS GRAFT  06/24/2010   LIMA to the LAD, SVG to first diagonal, SVG to ramus intermediate, SVG to acute marginal. EF  50%. 2/12  . EXPLORATION POST OPERATIVE OPEN HEART    . EXPLORATORY LAPAROTOMY  09/2009   ligation of lumbar arteries  . EXTERNAL EAR SURGERY Left    laceration child  . HIP SURGERY  40 years ago   left hip bone removal and pinning  . INGUINAL HERNIA REPAIR Left 07/21/2012   Procedure: HERNIA REPAIR INGUINAL ADULT;  Surgeon: Merrie Roof, MD;  Location: [facility];  Service: General;  Laterality: Left;  . INSERTION OF MESH Left 07/21/2012   Procedure: INSERTION OF MESH;  Surgeon: Merrie Roof, MD;  Location: [place];  Service: General;  Laterality: Left;  . LEFT HEART CATHETERIZATION WITH CORONARY/GRAFT ANGIOGRAM  06/21/2012   Procedure: LEFT HEART CATHETERIZATION WITH Beatrix Fetters;  Surgeon: Minus Breeding, MD;  Location: [facility] CATH LAB;  Service: Cardiovascular;;  . LEFT HEART CATHETERIZATION WITH CORONARY/GRAFT ANGIOGRAM N/A 07/04/2013   Procedure: LEFT HEART CATHETERIZATION WITH Beatrix Fetters;  Surgeon: Sinclair Grooms, MD;  Location: [facility] CATH LAB;  Service: Cardiovascular;  Laterality: N/A;  . NM MYOCAR PERF WALL MOTION  12/02/2010   inferoapical defect is fixed c/w prior infarction/scar, there is minimal borderzone ischemia.       Family History  Problem Relation Age of Onset  . Alcohol abuse Father   . Cancer Sister 76       unknown primary  . Diabetes Mother   . Hypertension Mother   . Cancer Sister 48       kidney cancer    Social History   Tobacco Use  . Smoking status: Former Smoker    Packs/day: 1.00    Years: 30.00    Pack years: 30.00    Types: Cigarettes    Quit date: 01/25/2010    Years since quitting: 9.7  . Smokeless tobacco: Never Used  . Tobacco comment: "havent smoked in 7 years"  Vaping Use  . Vaping Use: Never used  Substance Use Topics  . Alcohol use: No    Comment: former drinker - Quit 2011.  . Drug use: No    Home Medications Prior to Admission medications   Medication Sig Start Date End Date Taking? Authorizing Provider    albuterol (PROVENTIL HFA;VENTOLIN HFA) 108 (90 Base) MCG/ACT inhaler Inhale 2 puffs into the lungs every 6 (six) hours as needed for wheezing or shortness of breath. 05/23/18   Geradine Girt, DO  aspirin EC 81 MG EC tablet Take 1 tablet (81 mg total) by mouth daily. 05/24/18   Geradine Girt, DO  budesonide-formoterol (SYMBICORT) 80-4.5 MCG/ACT inhaler Inhale 2 puffs into the lungs 2 (two) times daily. 10/06/19   Nils Flack, Mabell Esguerra A, PA-C  clotrimazole (LOTRIMIN) 1 % cream Apply to affected area 2 times daily 10/06/19   Nils Flack, Shaila Gilchrest A, PA-C  furosemide (LASIX) 20 MG tablet Take  20 mg by mouth daily.     [provider]  gabapentin (NEURONTIN) 300 MG capsule Take 300 mg by mouth 2 (two) times daily. 05/14/18   [provider]  metFORMIN (GLUCOPHAGE) 500 MG tablet Take 0.5 tablets by mouth 2 (two) times daily. 08/27/18   [provider]  nystatin (MYCOSTATIN/NYSTOP) powder Apply topically 3 (three) times daily. 10/19/18   Patwardhan, Reynold Bowen, MD  rivaroxaban (XARELTO) 2.5 MG TABS tablet Take 1 tablet (2.5 mg total) by mouth 2 (two) times daily. 01/31/19   Miquel Dunn, NP  rosuvastatin (CRESTOR) 40 MG tablet Take 40 mg by mouth daily.    [provider]  sacubitril-valsartan (ENTRESTO) 24-26 MG Take 1 tablet by mouth every other day. 10/26/18   Miquel Dunn, NP    Allergies    Patient has no known allergies.  Review of Systems   Review of Systems  Constitutional: Negative for chills and fever.  Respiratory: Negative for shortness of breath.   Cardiovascular: Positive for leg swelling (Chronic, unchanged). Negative for chest pain.  Gastrointestinal: Positive for abdominal pain. Negative for nausea and vomiting.  Genitourinary: Positive for difficulty urinating, dysuria, frequency and scrotal swelling. Negative for discharge, hematuria, penile pain and penile swelling.  All other systems reviewed and are negative.   Physical Exam Updated Vital  Signs BP 124/73   Pulse (!) 56   Temp 98.2 F (36.8 C) (Oral)   Resp (!) 23   Ht 5\' 7"  (1.702 m)   Wt 77.1 kg   SpO2 97%   BMI 26.63 kg/m   Physical Exam Vitals and nursing note reviewed.  Constitutional:      General: He is not in acute distress.    Appearance: He is well-developed.  HENT:     Head: Normocephalic and atraumatic.  Eyes:     General:        Right eye: No discharge.        Left eye: No discharge.     Conjunctiva/sclera: Conjunctivae normal.  Neck:     Vascular: No JVD.     Trachea: No tracheal deviation.  Cardiovascular:     Rate and Rhythm: Normal rate and regular rhythm.     Heart sounds: Normal heart sounds.  Pulmonary:     Effort: Pulmonary effort is normal.     Comments: Globally diminished breath sounds, speaking in full sentences without difficulty, SPO2 saturations 94 to 96% on room air Abdominal:     General: A surgical scar is present. Bowel sounds are normal. There is no distension.     Palpations: Abdomen is soft.     Tenderness: There is no abdominal tenderness. There is no right CVA tenderness, left CVA tenderness, guarding or rebound.  Genitourinary:    Comments: Examination performed in the presence of a chaperone.  Yeasty rash noted to the groin bilaterally extending to the scrotum.  Mild scrotal edema but no tenderness to palpation of the scrotum, testicles or epididymi.  No urethral drainage. Musculoskeletal:     Cervical back: Neck supple.     Right lower leg: No edema.     Left lower leg: No edema.     Comments: Bilateral lower extremity edema at patient's baseline per his report.  Wearing compression stockings bilaterally  Skin:    General: Skin is warm and dry.     Findings: No erythema.  Neurological:     Mental Status: He is alert.  Psychiatric:  Behavior: Behavior normal.     ED Results / Procedures / Treatments   Labs (all labs ordered are listed, but only abnormal results are displayed) Labs Reviewed   URINALYSIS, ROUTINE W REFLEX MICROSCOPIC - Abnormal; Notable for the following components:      Result Value   Color, Urine STRAW (*)    Specific Gravity, Urine 1.004 (*)    Hgb urine dipstick SMALL (*)    Protein, ur 100 (*)    Bacteria, UA RARE (*)    All other components within normal limits  CBC WITH DIFFERENTIAL/PLATELET - Abnormal; Notable for the following components:   HCT 53.1 (*)    MCV 103.3 (*)    All other components within normal limits  COMPREHENSIVE METABOLIC PANEL - Abnormal; Notable for the following components:   Creatinine, Ser 1.54 (*)    GFR calc non Af Amer 45 (*)    GFR calc Af Amer 52 (*)    All other components within normal limits  URINE CULTURE    EKG None  Radiology No results found.  Procedures Ultrasound ED Abd  Date/Time: 10/06/2019 3:17 PM Performed by: Renita Papa, PA-C Authorized by: Rodell Perna A, PA-C   Procedure details:    Indications: decreased urinary output     Scope of abdominal ultrasound: urinary retention.   Bladder:  Visualized    Images: archived   Study Limitations: body habitus Bladder findings:    Post-void residual volume:  92 mL   (including critical care time)  Medications Ordered in ED Medications - No data to display  ED Course  I have reviewed the triage vital signs and the nursing notes.  Pertinent labs & imaging results that were available during my care of the patient were reviewed by me and considered in my medical decision making (see chart for details).    MDM Rules/Calculators/A&P                          Patient presenting for evaluation of months of bilateral groin pain as well as urinary symptoms.  He is afebrile, vital signs are at his baseline.  He is nontoxic in appearance.  Abdomen is soft with no rebound or guarding, no focal tenderness.  Bedside ultrasound performed by myself to assess for post void residual which showed a little under 100 mL of urine in the bladder.  Prostate appears  enlarged.  UA is equivocal for UTI, will culture but hold off on any treatment at this time.  I suspect his urinary symptoms are likely in the setting of his BPH.  Recommend follow-up with urology for reevaluation of this.  On examination patient has an obvious candidal intertrigo infection extending into the scrotum.  No testicular pain or severe scrotal swelling noted.  Doubt epididymitis, orchitis, prostatitis or proctitis. Doubt Fournier's gangrene. Remainder of lab work reviewed and interpreted by myself shows no leukocytosis, no anemia, renal insufficiency at patient's baseline, no metabolic derangements.Doubt STD or acute surgical abdominal pathology at this time.    We discussed treatment of yeast infection, lifestyle modifications, avoiding excessive moisture and using breathable fabrics.  He will follow up with his PCP for reevaluation of this and medication management.  We will refill his Symbicort which he ran out of a week ago.  Does not appear to be in any respiratory distress.  He will follow up with urology on an outpatient basis for management of urinary retention likely in the setting of  BPH.  Discussed strict ED return precautions. Patient verbalized understanding of and agreement with plan and is safe for discharge home at this time.  Discussed with Dr. Wilson Singer who agrees with assessment and plan at this time.    Final Clinical Impression(s) / ED Diagnoses Final diagnoses:  Candidal intertrigo  Difficulty urinating  Medication refill    Rx / DC Orders ED Discharge Orders         Ordered    clotrimazole (LOTRIMIN) 1 % cream     Discontinue  Reprint     10/06/19 1513    budesonide-formoterol (SYMBICORT) 80-4.5 MCG/ACT inhaler  2 times daily     Discontinue  Reprint     10/06/19 [place], Ledia Hanford A, PA-C 10/06/19 1520    Virgel Manifold, MD 10/07/19 1304

## 2019-10-06 NOTE — Discharge Instructions (Signed)
You have a yeast infection to the groin which I suspect is causing a lot of your discomfort around this area.  I have prescribed an antifungal cream to be applied twice daily to the affected area.  In addition to this please read the attached information in the packet relating to yeast infections.  Wear breathable cotton fabrics, avoid excessive moisture and make sure you dry off after showers thoroughly.  I suspect that your urinary symptoms are due to enlargement of the prostate causing difficulty urinating.  I would recommend follow-up with your PCP or a urologist for reevaluation of this.  Your urine today did not look obviously infected but we did send this off for culture to see if it grows any bacteria.  Return to the emergency department if any concerning signs or symptoms develop such as fevers, vomiting, severe uncontrolled pain, blood in the urine or stool.

## 2019-10-07 ENCOUNTER — Telehealth: Payer: Self-pay

## 2019-10-07 LAB — URINE CULTURE: Culture: 10000 — AB

## 2019-10-07 NOTE — Telephone Encounter (Signed)
Patient called because he did not have lotrimin prescription at the pharmacy. Called [facility] [place]. They ran it through insurance, but it would not be covered. It can be picked up OTC. Called the patient back to let him know.

## 2019-11-09 ENCOUNTER — Emergency Department (HOSPITAL_COMMUNITY): Payer: Medicare Other

## 2019-11-09 ENCOUNTER — Encounter (HOSPITAL_COMMUNITY): Payer: Self-pay | Admitting: Emergency Medicine

## 2019-11-09 ENCOUNTER — Emergency Department (HOSPITAL_COMMUNITY)
Admission: EM | Admit: 2019-11-09 | Discharge: 2019-11-09 | Disposition: A | Discharge status: Home / Self Care | Payer: Medicare Other | Attending: Emergency Medicine | Admitting: Emergency Medicine

## 2019-11-09 DIAGNOSIS — Z7982 Long term (current) use of aspirin: Secondary | ICD-10-CM | POA: Insufficient documentation

## 2019-11-09 DIAGNOSIS — Z950 Presence of cardiac pacemaker: Secondary | ICD-10-CM | POA: Insufficient documentation

## 2019-11-09 DIAGNOSIS — I110 Hypertensive heart disease with heart failure: Secondary | ICD-10-CM | POA: Diagnosis not present

## 2019-11-09 DIAGNOSIS — I2510 Atherosclerotic heart disease of native coronary artery without angina pectoris: Secondary | ICD-10-CM | POA: Insufficient documentation

## 2019-11-09 DIAGNOSIS — I5023 Acute on chronic systolic (congestive) heart failure: Secondary | ICD-10-CM | POA: Diagnosis not present

## 2019-11-09 DIAGNOSIS — R05 Cough: Secondary | ICD-10-CM | POA: Diagnosis not present

## 2019-11-09 DIAGNOSIS — J441 Chronic obstructive pulmonary disease with (acute) exacerbation: Secondary | ICD-10-CM | POA: Insufficient documentation

## 2019-11-09 DIAGNOSIS — Z7901 Long term (current) use of anticoagulants: Secondary | ICD-10-CM | POA: Diagnosis not present

## 2019-11-09 DIAGNOSIS — R0602 Shortness of breath: Secondary | ICD-10-CM | POA: Diagnosis not present

## 2019-11-09 DIAGNOSIS — J80 Acute respiratory distress syndrome: Secondary | ICD-10-CM | POA: Diagnosis not present

## 2019-11-09 DIAGNOSIS — R0902 Hypoxemia: Secondary | ICD-10-CM | POA: Diagnosis not present

## 2019-11-09 DIAGNOSIS — J9811 Atelectasis: Secondary | ICD-10-CM | POA: Diagnosis not present

## 2019-11-09 DIAGNOSIS — R1084 Generalized abdominal pain: Secondary | ICD-10-CM | POA: Diagnosis not present

## 2019-11-09 DIAGNOSIS — Z87891 Personal history of nicotine dependence: Secondary | ICD-10-CM | POA: Insufficient documentation

## 2019-11-09 LAB — URINALYSIS, ROUTINE W REFLEX MICROSCOPIC
Bilirubin Urine: NEGATIVE
Glucose, UA: NEGATIVE mg/dL
Hgb urine dipstick: NEGATIVE
Ketones, ur: NEGATIVE mg/dL
Leukocytes,Ua: NEGATIVE
Nitrite: NEGATIVE
Protein, ur: 30 mg/dL — AB
Specific Gravity, Urine: 1.01 (ref 1.005–1.030)
pH: 8 (ref 5.0–8.0)

## 2019-11-09 LAB — CBC WITH DIFFERENTIAL/PLATELET
Abs Immature Granulocytes: 0.01 10*3/uL (ref 0.00–0.07)
Basophils Absolute: 0.1 10*3/uL (ref 0.0–0.1)
Basophils Relative: 1 %
Eosinophils Absolute: 0.6 10*3/uL — ABNORMAL HIGH (ref 0.0–0.5)
Eosinophils Relative: 8 %
HCT: 49.2 % (ref 39.0–52.0)
Hemoglobin: 15.7 g/dL (ref 13.0–17.0)
Immature Granulocytes: 0 %
Lymphocytes Relative: 34 %
Lymphs Abs: 2.4 10*3/uL (ref 0.7–4.0)
MCH: 33.3 pg (ref 26.0–34.0)
MCHC: 31.9 g/dL (ref 30.0–36.0)
MCV: 104.2 fL — ABNORMAL HIGH (ref 80.0–100.0)
Monocytes Absolute: 0.6 10*3/uL (ref 0.1–1.0)
Monocytes Relative: 9 %
Neutro Abs: 3.5 10*3/uL (ref 1.7–7.7)
Neutrophils Relative %: 48 %
Platelets: 214 10*3/uL (ref 150–400)
RBC: 4.72 MIL/uL (ref 4.22–5.81)
RDW: 12.4 % (ref 11.5–15.5)
WBC: 7.2 10*3/uL (ref 4.0–10.5)
nRBC: 0 % (ref 0.0–0.2)

## 2019-11-09 LAB — COMPREHENSIVE METABOLIC PANEL
ALT: 19 U/L (ref 0–44)
AST: 21 U/L (ref 15–41)
Albumin: 3.3 g/dL — ABNORMAL LOW (ref 3.5–5.0)
Alkaline Phosphatase: 78 U/L (ref 38–126)
Anion gap: 11 (ref 5–15)
BUN: 19 mg/dL (ref 8–23)
CO2: 25 mmol/L (ref 22–32)
Calcium: 9 mg/dL (ref 8.9–10.3)
Chloride: 104 mmol/L (ref 98–111)
Creatinine, Ser: 1.21 mg/dL (ref 0.61–1.24)
GFR calc Af Amer: >60 mL/min (ref >60)
GFR calc non Af Amer: >60 mL/min (ref >60)
Glucose, Bld: 130 mg/dL — ABNORMAL HIGH (ref 70–99)
Potassium: 4.1 mmol/L (ref 3.5–5.1)
Sodium: 140 mmol/L (ref 135–145)
Total Bilirubin: 0.5 mg/dL (ref 0.3–1.2)
Total Protein: 6.6 g/dL (ref 6.5–8.1)

## 2019-11-09 LAB — BRAIN NATRIURETIC PEPTIDE: B Natriuretic Peptide: 39.9 pg/mL (ref 0.0–100.0)

## 2019-11-09 MED ORDER — BUDESONIDE-FORMOTEROL FUMARATE 80-4.5 MCG/ACT IN AERO: 2.0000 | INHALATION_SPRAY | Freq: Two times a day (BID) | RESPIRATORY_TRACT | 0 refills | Status: DC

## 2019-11-09 MED ORDER — PREDNISONE 20 MG PO TABS
40.0000 mg | ORAL_TABLET | Freq: Every day | ORAL | 0 refills | Status: DC
Start: 2019-11-09 — End: 2020-02-10

## 2019-11-09 MED ORDER — ALBUTEROL SULFATE HFA 108 (90 BASE) MCG/ACT IN AERS: 2.0000 | INHALATION_SPRAY | Freq: Four times a day (QID) | RESPIRATORY_TRACT | 0 refills | Status: DC | PRN

## 2019-11-09 NOTE — ED Triage Notes (Signed)
Patient brought in for shortness of breath with [facility], productive cough. Received 125 mg solumedrol and 5mg  albuterol prior to arrival. Patient states she has history of asthma, has albuterol and Symbacort at home but ran out of Symbacort two days ago. Patient alert, oriented, and in no apparent distress at this time.

## 2019-11-09 NOTE — ED Provider Notes (Signed)
[facility] EMERGENCY DEPARTMENT Provider Note   CSN: [id number] Arrival date & time: 11/09/19  1841     History Chief Complaint  Patient presents with  . Shortness of Breath    Chris Payne is a 69 y.o. male.  HPI    Patient with multiple medical issues including CAD, COPD, CHF presents with dyspnea. He notes that he was doing generally well until about 2 days ago, when he ran out of his medication began having increased work of breathing, shortness of breath.  On no chest pain, no fever. With persistency of symptoms he called for assistance. He notes that now after receiving bronchodilators, Solu-Medrol he has begun to feel better. He currently denies any chest pain, abdominal pain, other complaints.  Per EMS report the patient called for shortness of breath.  Patient also had cough at the time, had improvement, as above with albuterol, Solu-Medrol.  Past Medical History:  Diagnosis Date  . AAA (abdominal aortic aneurysm) ([place])    s/p repair 6/11  . CAD (coronary artery disease)    s/p CABG 2/12:  LIMA to LAD, SVG to diagonal-1, SVG to ramus intermedius,, SVG to AM (Dr. Roxan Hockey) ;  b.  Myoview 10/13:  inf infarct with very mild peri-infarct ischemia, EF 49%, inf HK; c  He has severe three-vessel native coronary artery disease. Catheterization March 2015 as above  . CAP (community acquired pneumonia) 02/23/2014  . Carotid stenosis    a.  dopplers 5/91: LICA 63-84%;  b. Carotid dopplers 3/14:  R 0-39%, L 60-79%, repeat 6 mos  . CHF (congestive heart failure) ([place])   . COPD (chronic obstructive pulmonary disease) ([place])   . GERD (gastroesophageal reflux disease)   . Headache(784.0)   . Hyperlipidemia   . Hypertension   . Hypoxia 02/23/2014  . Inguinal hernia   . Myocardial infarction [facility])    PCI x3  . PAD (peripheral artery disease) ([place])    ABIs 4/12:  R 0.88, L 0.92; R SFA 40%, L CFA 50%, L SFA 50-60%  . Pneumonia    hx  . Stroke [facility])     h/o pontine CVA  . Thyroid nodule    incidental finding on carotid doppler 3/14 => thyroid U/S ordered    Patient Active Problem List   Diagnosis Date Noted  . [place]ida rash of groin 10/19/2018  . Acute on chronic systolic heart failure ([place]) 10/17/2018  . Acute on chronic systolic (congestive) heart failure ([place]) 10/17/2018  . COPD exacerbation ([place]) 05/21/2018  . Chronic pain syndrome 07/19/2015  . Degenerative disc disease, lumbar 07/19/2015  . Weakness 04/17/2015  . Generalized weakness 04/17/2015  . Polycythemia 04/17/2015  . BPH (benign prostatic hyperplasia) 04/17/2015  . Seizure disorder ([place]) 10/12/2014  . TIA (transient ischemic attack) 08/23/2013  . Other and unspecified angina pectoris 07/04/2013  . Nontoxic multinodular goiter 10/07/2012  . Unstable angina ([place]) 06/19/2012  . Carotid stenosis   . PAD (peripheral artery disease) ([place])   . Left inguinal hernia 05/30/2012  . COPD (chronic obstructive pulmonary disease) ([place]) 08/24/2011  . S/P AAA repair 08/24/2011  . Lumbar disc disease   . Hypertensive heart disease   . Hyperlipidemia   . CAD (coronary artery disease)   . History of pontine CVA   . GERD (gastroesophageal reflux disease)     Past Surgical History:  Procedure Laterality Date  . ABDOMINAL AORTIC ANEURYSM REPAIR  10-03-2009  . CORONARY ARTERY BYPASS GRAFT  06/24/2010   LIMA to  the LAD, SVG to first diagonal, SVG to ramus intermediate, SVG to acute marginal. EF 50%. 2/12  . EXPLORATION POST OPERATIVE OPEN HEART    . EXPLORATORY LAPAROTOMY  09/2009   ligation of lumbar arteries  . EXTERNAL EAR SURGERY Left    laceration child  . HIP SURGERY  40 years ago   left hip bone removal and pinning  . INGUINAL HERNIA REPAIR Left 07/21/2012   Procedure: HERNIA REPAIR INGUINAL ADULT;  Surgeon: Merrie Roof, MD;  Location: [facility];  Service: General;  Laterality: Left;  . INSERTION OF MESH Left 07/21/2012   Procedure: INSERTION OF MESH;  Surgeon: Merrie Roof,  MD;  Location: [facility];  Service: General;  Laterality: Left;  . LEFT HEART CATHETERIZATION WITH CORONARY/GRAFT ANGIOGRAM  06/21/2012   Procedure: LEFT HEART CATHETERIZATION WITH Beatrix Fetters;  Surgeon: Minus Breeding, MD;  Location: [facility] CATH LAB;  Service: Cardiovascular;;  . LEFT HEART CATHETERIZATION WITH CORONARY/GRAFT ANGIOGRAM N/A 07/04/2013   Procedure: LEFT HEART CATHETERIZATION WITH Beatrix Fetters;  Surgeon: Sinclair Grooms, MD;  Location: [facility] CATH LAB;  Service: Cardiovascular;  Laterality: N/A;  . NM MYOCAR PERF WALL MOTION  12/02/2010   inferoapical defect is fixed c/w prior infarction/scar, there is minimal borderzone ischemia.       Family History  Problem Relation Age of Onset  . Alcohol abuse Father   . Cancer Sister 78       unknown primary  . Diabetes Mother   . Hypertension Mother   . Cancer Sister 84       kidney cancer    Social History   Tobacco Use  . Smoking status: Former Smoker    Packs/day: 1.00    Years: 30.00    Pack years: 30.00    Types: Cigarettes    Quit date: 01/25/2010    Years since quitting: 9.7  . Smokeless tobacco: Never Used  . Tobacco comment: "havent smoked in 7 years"  Vaping Use  . Vaping Use: Never used  Substance Use Topics  . Alcohol use: No    Comment: former drinker - Quit 2011.  . Drug use: No    Home Medications Prior to Admission medications   Medication Sig Start Date End Date Taking? Authorizing Provider  albuterol (VENTOLIN HFA) 108 (90 Base) MCG/ACT inhaler Inhale 2 puffs into the lungs every 6 (six) hours as needed for wheezing or shortness of breath. 11/09/19   Carmin Muskrat, MD  aspirin EC 81 MG EC tablet Take 1 tablet (81 mg total) by mouth daily. 05/24/18   Geradine Girt, DO  budesonide-formoterol (SYMBICORT) 80-4.5 MCG/ACT inhaler Inhale 2 puffs into the lungs 2 (two) times daily. 11/09/19   Carmin Muskrat, MD  clotrimazole (LOTRIMIN) 1 % cream Apply to affected area 2 times daily 10/06/19    Rodell Perna A, PA-C  furosemide (LASIX) 20 MG tablet Take 1 tablet (20 mg total) by mouth daily. 10/06/19   Fawze, Mina A, PA-C  gabapentin (NEURONTIN) 300 MG capsule Take 300 mg by mouth 2 (two) times daily. 05/14/18   [provider]  metFORMIN (GLUCOPHAGE) 500 MG tablet Take 0.5 tablets by mouth 2 (two) times daily. 08/27/18   [provider]  nystatin (MYCOSTATIN/NYSTOP) powder Apply topically 3 (three) times daily. 10/19/18   Patwardhan, Reynold Bowen, MD  predniSONE (DELTASONE) 20 MG tablet Take 2 tablets (40 mg total) by mouth daily with breakfast. For the next four days 11/09/19   Carmin Muskrat, MD  rivaroxaban (XARELTO) 2.5 MG TABS tablet Take 1 tablet (2.5 mg total) by mouth 2 (two) times daily. 01/31/19   Miquel Dunn, NP  rosuvastatin (CRESTOR) 40 MG tablet Take 40 mg by mouth daily.    [provider]  sacubitril-valsartan (ENTRESTO) 24-26 MG Take 1 tablet by mouth every other day. 10/26/18   Miquel Dunn, NP    Allergies    Patient has no known allergies.  Review of Systems   Review of Systems  Constitutional:       Per HPI, otherwise negative  HENT:       Per HPI, otherwise negative  Respiratory:       Per HPI, otherwise negative  Cardiovascular:       Per HPI, otherwise negative  Gastrointestinal: Negative for vomiting.  Endocrine:       Negative aside from HPI  Genitourinary:       Neg aside from HPI   Musculoskeletal:       Per HPI, otherwise negative  Skin: Negative.   Neurological: Negative for syncope.    Physical Exam Updated Vital Signs BP 128/89 (BP Location: Right Arm)   Pulse 88   Temp 98.1 F (36.7 C) (Oral)   Resp 18   SpO2 95%   Physical Exam Vitals and nursing note reviewed.  Constitutional:      General: He is not in acute distress.    Appearance: He is well-developed. He is ill-appearing. He is not toxic-appearing or diaphoretic.  HENT:     Head: Normocephalic and atraumatic.  Eyes:      Conjunctiva/sclera: Conjunctivae normal.  Cardiovascular:     Rate and Rhythm: Normal rate and regular rhythm.  Pulmonary:     Effort: Tachypnea present.     Breath sounds: Decreased breath sounds present.  Abdominal:     General: There is no distension.  Skin:    General: Skin is warm and dry.  Neurological:     Mental Status: He is alert and oriented to person, place, and time.      ED Results / Procedures / Treatments   Labs (all labs ordered are listed, but only abnormal results are displayed) Labs Reviewed  COMPREHENSIVE METABOLIC PANEL - Abnormal; Notable for the following components:      Result Value   Glucose, Bld 130 (*)    Albumin 3.3 (*)    All other components within normal limits  CBC WITH DIFFERENTIAL/PLATELET - Abnormal; Notable for the following components:   MCV 104.2 (*)    Eosinophils Absolute 0.6 (*)    All other components within normal limits  URINALYSIS, ROUTINE W REFLEX MICROSCOPIC - Abnormal; Notable for the following components:   Protein, ur 30 (*)    Bacteria, UA RARE (*)    All other components within normal limits  BRAIN NATRIURETIC PEPTIDE    EKG Sinus rhythm, rate 75, wander and inferior leads, Q waves anteriorly, abnormal EKG   Radiology DG Chest 2 View  Result Date: 11/09/2019 CLINICAL DATA:  Shortness of breath.  Productive cough. EXAM: CHEST - 2 VIEW COMPARISON:  10/18/2018 FINDINGS: Post median sternotomy. Post CABG. The cardiomediastinal contours are normal. Streaky bibasilar atelectasis. Pulmonary vasculature is normal. No consolidation, pleural effusion, or pneumothorax. No acute osseous abnormalities are seen. IMPRESSION: Low lung volumes with streaky bibasilar atelectasis. Electronically Signed   By: Keith Rake M.D.   On: 11/09/2019 19:53    Procedures Procedures (including critical care time)  ED Course  I have reviewed the  triage vital signs and the nursing notes.  Pertinent labs & imaging results that were  available during my care of the patient were reviewed by me and considered in my medical decision making (see chart for details).   11:41 PM Patient with no oxygen requirement, no hypoxia, no increased work of breathing, he has received interventions as above, Solu-Medrol, bronchodilator. X-ray reassuring, labs reassuring, no evidence for pneumonia, suspicion for COPD exacerbation, and given his absence of oxygen requirement, patient is appropriate for new prescriptions for his bronchodilators ongoing steroids, discharged without close outpatient follow-up.  MDM Number of Diagnoses or Management Options COPD exacerbation ([place]): established, worsening   Amount and/or Complexity of Data Reviewed Clinical lab tests: reviewed Tests in the radiology section of CPT: reviewed Tests in the medicine section of CPT: reviewed Obtain history from someone other than the patient: yes Discuss the patient with other providers: yes Independent visualization of images, tracings, or specimens: yes  Risk of Complications, Morbidity, and/or Mortality Presenting problems: high Diagnostic procedures: high Management options: high  Critical Care Total time providing critical care: < 30 minutes  Patient Progress Patient progress: improved Final Clinical Impression(s) / ED Diagnoses Final diagnoses:  COPD exacerbation ([place])    Rx / DC Orders ED Discharge Orders         Ordered    budesonide-formoterol (SYMBICORT) 80-4.5 MCG/ACT inhaler  2 times daily     Discontinue  Reprint     11/09/19 2325    albuterol (VENTOLIN HFA) 108 (90 Base) MCG/ACT inhaler  Every 6 hours PRN     Discontinue  Reprint     11/09/19 2325    predniSONE (DELTASONE) 20 MG tablet  Daily with breakfast     Discontinue  Reprint     11/09/19 2325           Carmin Muskrat, MD 11/09/19 2343

## 2019-11-09 NOTE — Discharge Instructions (Signed)
As discussed, your evaluation today has been largely reassuring.  But, it is important that you monitor your condition carefully, and do not hesitate to return to the ED if you develop new, or concerning changes in your condition. ? ?Otherwise, please follow-up with your physician for appropriate ongoing care. ? ?

## 2019-11-17 DIAGNOSIS — R6889 Other general symptoms and signs: Secondary | ICD-10-CM | POA: Diagnosis not present

## 2019-12-12 DIAGNOSIS — N138 Other obstructive and reflux uropathy: Secondary | ICD-10-CM | POA: Diagnosis not present

## 2019-12-12 DIAGNOSIS — R339 Retention of urine, unspecified: Secondary | ICD-10-CM | POA: Diagnosis not present

## 2019-12-12 DIAGNOSIS — R300 Dysuria: Secondary | ICD-10-CM | POA: Diagnosis not present

## 2019-12-29 DIAGNOSIS — N138 Other obstructive and reflux uropathy: Secondary | ICD-10-CM | POA: Diagnosis not present

## 2020-01-29 DIAGNOSIS — N138 Other obstructive and reflux uropathy: Secondary | ICD-10-CM | POA: Diagnosis not present

## 2020-02-08 DIAGNOSIS — Z01812 Encounter for preprocedural laboratory examination: Secondary | ICD-10-CM | POA: Diagnosis not present

## 2020-02-08 DIAGNOSIS — Z20822 Contact with and (suspected) exposure to covid-19: Secondary | ICD-10-CM | POA: Diagnosis not present

## 2020-02-08 DIAGNOSIS — N138 Other obstructive and reflux uropathy: Secondary | ICD-10-CM | POA: Diagnosis not present

## 2020-02-10 ENCOUNTER — Emergency Department (HOSPITAL_BASED_OUTPATIENT_CLINIC_OR_DEPARTMENT_OTHER): Payer: Medicare Other

## 2020-02-10 ENCOUNTER — Emergency Department (HOSPITAL_BASED_OUTPATIENT_CLINIC_OR_DEPARTMENT_OTHER)
Admission: EM | Admit: 2020-02-10 | Discharge: 2020-02-10 | Disposition: A | Discharge status: Home / Self Care | Payer: Medicare Other | Attending: Emergency Medicine | Admitting: Emergency Medicine

## 2020-02-10 ENCOUNTER — Encounter (HOSPITAL_BASED_OUTPATIENT_CLINIC_OR_DEPARTMENT_OTHER): Payer: Self-pay | Admitting: Emergency Medicine

## 2020-02-10 ENCOUNTER — Other Ambulatory Visit: Payer: Self-pay

## 2020-02-10 DIAGNOSIS — I5023 Acute on chronic systolic (congestive) heart failure: Secondary | ICD-10-CM | POA: Insufficient documentation

## 2020-02-10 DIAGNOSIS — Z7982 Long term (current) use of aspirin: Secondary | ICD-10-CM | POA: Diagnosis not present

## 2020-02-10 DIAGNOSIS — R609 Edema, unspecified: Secondary | ICD-10-CM | POA: Insufficient documentation

## 2020-02-10 DIAGNOSIS — Z87891 Personal history of nicotine dependence: Secondary | ICD-10-CM | POA: Insufficient documentation

## 2020-02-10 DIAGNOSIS — Z7901 Long term (current) use of anticoagulants: Secondary | ICD-10-CM | POA: Diagnosis not present

## 2020-02-10 DIAGNOSIS — I110 Hypertensive heart disease with heart failure: Secondary | ICD-10-CM | POA: Insufficient documentation

## 2020-02-10 DIAGNOSIS — R0602 Shortness of breath: Secondary | ICD-10-CM

## 2020-02-10 DIAGNOSIS — Z20822 Contact with and (suspected) exposure to covid-19: Secondary | ICD-10-CM | POA: Insufficient documentation

## 2020-02-10 DIAGNOSIS — Z951 Presence of aortocoronary bypass graft: Secondary | ICD-10-CM | POA: Diagnosis not present

## 2020-02-10 DIAGNOSIS — R059 Cough, unspecified: Secondary | ICD-10-CM | POA: Diagnosis not present

## 2020-02-10 DIAGNOSIS — R0600 Dyspnea, unspecified: Secondary | ICD-10-CM | POA: Diagnosis present

## 2020-02-10 DIAGNOSIS — J441 Chronic obstructive pulmonary disease with (acute) exacerbation: Secondary | ICD-10-CM

## 2020-02-10 DIAGNOSIS — I2510 Atherosclerotic heart disease of native coronary artery without angina pectoris: Secondary | ICD-10-CM | POA: Insufficient documentation

## 2020-02-10 DIAGNOSIS — J90 Pleural effusion, not elsewhere classified: Secondary | ICD-10-CM | POA: Diagnosis not present

## 2020-02-10 LAB — CBC WITH DIFFERENTIAL/PLATELET
Abs Immature Granulocytes: 0.03 10*3/uL (ref 0.00–0.07)
Basophils Absolute: 0.1 10*3/uL (ref 0.0–0.1)
Basophils Relative: 1 %
Eosinophils Absolute: 0.9 10*3/uL — ABNORMAL HIGH (ref 0.0–0.5)
Eosinophils Relative: 10 %
HCT: 47.9 % (ref 39.0–52.0)
Hemoglobin: 15.2 g/dL (ref 13.0–17.0)
Immature Granulocytes: 0 %
Lymphocytes Relative: 31 %
Lymphs Abs: 2.7 10*3/uL (ref 0.7–4.0)
MCH: 33.3 pg (ref 26.0–34.0)
MCHC: 31.7 g/dL (ref 30.0–36.0)
MCV: 104.8 fL — ABNORMAL HIGH (ref 80.0–100.0)
Monocytes Absolute: 0.7 10*3/uL (ref 0.1–1.0)
Monocytes Relative: 8 %
Neutro Abs: 4.3 10*3/uL (ref 1.7–7.7)
Neutrophils Relative %: 50 %
Platelets: 150 10*3/uL (ref 150–400)
RBC: 4.57 MIL/uL (ref 4.22–5.81)
RDW: 12.9 % (ref 11.5–15.5)
WBC: 8.7 10*3/uL (ref 4.0–10.5)
nRBC: 0 % (ref 0.0–0.2)

## 2020-02-10 LAB — BRAIN NATRIURETIC PEPTIDE: B Natriuretic Peptide: 19 pg/mL (ref 0.0–100.0)

## 2020-02-10 LAB — TROPONIN I (HIGH SENSITIVITY): Troponin I (High Sensitivity): 3 ng/L (ref <18)

## 2020-02-10 LAB — BASIC METABOLIC PANEL
Anion gap: 8 (ref 5–15)
BUN: 18 mg/dL (ref 8–23)
CO2: 27 mmol/L (ref 22–32)
Calcium: 8.5 mg/dL — ABNORMAL LOW (ref 8.9–10.3)
Chloride: 104 mmol/L (ref 98–111)
Creatinine, Ser: 1.11 mg/dL (ref 0.61–1.24)
GFR, Estimated: >60 mL/min (ref >60)
Glucose, Bld: 166 mg/dL — ABNORMAL HIGH (ref 70–99)
Potassium: 3.9 mmol/L (ref 3.5–5.1)
Sodium: 139 mmol/L (ref 135–145)

## 2020-02-10 LAB — RESPIRATORY PANEL BY RT PCR (FLU A&B, COVID)
Influenza A by PCR: NEGATIVE
Influenza B by PCR: NEGATIVE
SARS Coronavirus 2 by RT PCR: NEGATIVE

## 2020-02-10 MED ORDER — IPRATROPIUM BROMIDE HFA 17 MCG/ACT IN AERS
2.0000 | INHALATION_SPRAY | Freq: Once | RESPIRATORY_TRACT | Status: AC
  Administered 2020-02-10: 2 via RESPIRATORY_TRACT
  Filled 2020-02-10: qty 12.9

## 2020-02-10 MED ORDER — AEROCHAMBER PLUS FLO-VU MISC
1.0000 | Freq: Once | Status: AC
  Administered 2020-02-10: 1
  Filled 2020-02-10: qty 1

## 2020-02-10 MED ORDER — METFORMIN HCL 500 MG PO TABS
250.0000 mg | ORAL_TABLET | Freq: Two times a day (BID) | ORAL | 0 refills | Status: DC
Start: 2020-02-10 — End: 2020-03-08

## 2020-02-10 MED ORDER — ALBUTEROL SULFATE HFA 108 (90 BASE) MCG/ACT IN AERS
8.0000 | INHALATION_SPRAY | Freq: Once | RESPIRATORY_TRACT | Status: AC
  Administered 2020-02-10: 8 via RESPIRATORY_TRACT

## 2020-02-10 MED ORDER — PREDNISONE 50 MG PO TABS
60.0000 mg | ORAL_TABLET | Freq: Once | ORAL | Status: AC
  Administered 2020-02-10: 60 mg via ORAL
  Filled 2020-02-10: qty 1

## 2020-02-10 MED ORDER — ALBUTEROL SULFATE HFA 108 (90 BASE) MCG/ACT IN AERS
6.0000 | INHALATION_SPRAY | RESPIRATORY_TRACT | Status: DC | PRN
  Filled 2020-02-10: qty 6.7

## 2020-02-10 MED ORDER — FUROSEMIDE 20 MG PO TABS
20.0000 mg | ORAL_TABLET | Freq: Every day | ORAL | 0 refills | Status: DC
Start: 2020-02-10 — End: 2021-05-19

## 2020-02-10 MED ORDER — PREDNISONE 20 MG PO TABS: ORAL_TABLET | ORAL | 0 refills | Status: DC

## 2020-02-10 NOTE — ED Notes (Signed)
Undocumented IV by EMS was removed from Rt wrist, IV catheter tip in tact, bleeding controlled, dsg applied. AVS reviewed with pt, instructed also on making a follow up appt with his primary care MD as soon as possible next week. Also informed pt in regards to the three (3) Rx's provided by the ED electronically that need to be picked up and taken as directed. Opportunity for questions provided. Pt placed in wheelchair and escorted to DC area. Aided pt in calling his family member to pick him up. Due to length of time of the family member's arrival, pt remains in wheelchair and in lobby near registration.

## 2020-02-10 NOTE — ED Triage Notes (Signed)
BIB [facility] d/t cough, wheezing, and shob x 1 month, worsening over last 2 days. No sick contacts that he is aware of. Pt is unvaccinated for covid. Pt in NAD, able to speak in full sentences. No cough noted during triage. Denies other sx.

## 2020-02-10 NOTE — Discharge Instructions (Signed)
He should be contacted by the social worker during a normal business hours to discuss help with getting her medications.  Please return for worsening trouble breathing.  He can use the albuterol inhaler up to every 4 hours while awake up to 6 puffs.  Use the other inhaler 2 puffs twice a day.

## 2020-02-10 NOTE — ED Notes (Signed)
Pt states he has been out of all of his medications x1 month. Sx started shortly after.

## 2020-02-10 NOTE — ED Notes (Signed)
Pt ambulated around room. HR 110, SAT 95%, no work of breathing noted.

## 2020-02-11 NOTE — ED Provider Notes (Signed)
[facility] EMERGENCY DEPARTMENT Provider Note   CSN: [id number] Arrival date & time: 02/10/20  1910     History Chief Complaint  Patient presents with  . Shortness of Breath    Chris Payne is a 69 y.o. male.  69 yo M with a chief complaint of difficulty breathing.  This been going on for about a month.  The patient had recently been in the hospital and was discharged.  When he ran out of his medications was when he started having symptoms.  Has chronic lower extremity edema that he does not think is significantly changed.  Has had increased cough increased sputum.  Denies fevers or chills.  Denies chest pain or pressure.  The history is provided by the patient and the EMS personnel.  Shortness of Breath Severity:  Moderate Onset quality:  Gradual Duration:  1 month Timing:  Constant Progression:  Worsening Chronicity:  New Relieved by:  Nothing Worsened by:  Nothing Ineffective treatments:  None tried Associated symptoms: cough   Associated symptoms: no abdominal pain, no chest pain, no fever, no headaches, no rash and no vomiting        Past Medical History:  Diagnosis Date  . AAA (abdominal aortic aneurysm) ([place])    s/p repair 6/11  . CAD (coronary artery disease)    s/p CABG 2/12:  LIMA to LAD, SVG to diagonal-1, SVG to ramus intermedius,, SVG to AM (Dr. Roxan Hockey) ;  b.  Myoview 10/13:  inf infarct with very mild peri-infarct ischemia, EF 49%, inf HK; c  He has severe three-vessel native coronary artery disease. Catheterization March 2015 as above  . CAP (community acquired pneumonia) 02/23/2014  . Carotid stenosis    a.  dopplers 8/65: LICA 78-46%;  b. Carotid dopplers 3/14:  R 0-39%, L 60-79%, repeat 6 mos  . CHF (congestive heart failure) ([place])   . COPD (chronic obstructive pulmonary disease) ([place])   . GERD (gastroesophageal reflux disease)   . Headache(784.0)   . Hyperlipidemia   . Hypertension   . Hypoxia 02/23/2014  . Inguinal hernia   .  Myocardial infarction [facility])    PCI x3  . PAD (peripheral artery disease) ([place])    ABIs 4/12:  R 0.88, L 0.92; R SFA 40%, L CFA 50%, L SFA 50-60%  . Pneumonia    hx  . Stroke [facility])    h/o pontine CVA  . Thyroid nodule    incidental finding on carotid doppler 3/14 => thyroid U/S ordered    Patient Active Problem List   Diagnosis Date Noted  . Candida rash of groin 10/19/2018  . Acute on chronic systolic heart failure ([place]) 10/17/2018  . Acute on chronic systolic (congestive) heart failure ([place]) 10/17/2018  . COPD exacerbation ([place]) 05/21/2018  . Chronic pain syndrome 07/19/2015  . Degenerative disc disease, lumbar 07/19/2015  . Weakness 04/17/2015  . Generalized weakness 04/17/2015  . Polycythemia 04/17/2015  . BPH (benign prostatic hyperplasia) 04/17/2015  . Seizure disorder ([place]) 10/12/2014  . TIA (transient ischemic attack) 08/23/2013  . Other and unspecified angina pectoris 07/04/2013  . Nontoxic multinodular goiter 10/07/2012  . Unstable angina ([place]) 06/19/2012  . Carotid stenosis   . PAD (peripheral artery disease) ([place])   . Left inguinal hernia 05/30/2012  . COPD (chronic obstructive pulmonary disease) ([place]) 08/24/2011  . S/P AAA repair 08/24/2011  . Lumbar disc disease   . Hypertensive heart disease   . Hyperlipidemia   . CAD (coronary artery disease)   .  History of pontine CVA   . GERD (gastroesophageal reflux disease)     Past Surgical History:  Procedure Laterality Date  . ABDOMINAL AORTIC ANEURYSM REPAIR  10-03-2009  . CORONARY ARTERY BYPASS GRAFT  06/24/2010   LIMA to the LAD, SVG to first diagonal, SVG to ramus intermediate, SVG to acute marginal. EF 50%. 2/12  . EXPLORATION POST OPERATIVE OPEN HEART    . EXPLORATORY LAPAROTOMY  09/2009   ligation of lumbar arteries  . EXTERNAL EAR SURGERY Left    laceration child  . HIP SURGERY  40 years ago   left hip bone removal and pinning  . INGUINAL HERNIA REPAIR Left 07/21/2012   Procedure: HERNIA REPAIR INGUINAL  ADULT;  Surgeon: Merrie Roof, MD;  Location: [facility];  Service: General;  Laterality: Left;  . INSERTION OF MESH Left 07/21/2012   Procedure: INSERTION OF MESH;  Surgeon: Merrie Roof, MD;  Location: [facility];  Service: General;  Laterality: Left;  . LEFT HEART CATHETERIZATION WITH CORONARY/GRAFT ANGIOGRAM  06/21/2012   Procedure: LEFT HEART CATHETERIZATION WITH Beatrix Fetters;  Surgeon: Minus Breeding, MD;  Location: [facility] CATH LAB;  Service: Cardiovascular;;  . LEFT HEART CATHETERIZATION WITH CORONARY/GRAFT ANGIOGRAM N/A 07/04/2013   Procedure: LEFT HEART CATHETERIZATION WITH Beatrix Fetters;  Surgeon: Sinclair Grooms, MD;  Location: [facility] CATH LAB;  Service: Cardiovascular;  Laterality: N/A;  . NM MYOCAR PERF WALL MOTION  12/02/2010   inferoapical defect is fixed c/w prior infarction/scar, there is minimal borderzone ischemia.       Family History  Problem Relation Age of Onset  . Alcohol abuse Father   . Cancer Sister 35       unknown primary  . Diabetes Mother   . Hypertension Mother   . Cancer Sister 75       kidney cancer    Social History   Tobacco Use  . Smoking status: Former Smoker    Packs/day: 1.00    Years: 30.00    Pack years: 30.00    Types: Cigarettes    Quit date: 01/25/2010    Years since quitting: 10.0  . Smokeless tobacco: Never Used  . Tobacco comment: "havent smoked in 7 years"  Vaping Use  . Vaping Use: Never used  Substance Use Topics  . Alcohol use: No    Comment: former drinker - Quit 2011.  . Drug use: No    Home Medications Prior to Admission medications   Medication Sig Start Date End Date Taking? Authorizing Provider  albuterol (VENTOLIN HFA) 108 (90 Base) MCG/ACT inhaler Inhale 2 puffs into the lungs every 6 (six) hours as needed for wheezing or shortness of breath. 11/09/19   Carmin Muskrat, MD  aspirin EC 81 MG EC tablet Take 1 tablet (81 mg total) by mouth daily. 05/24/18   Geradine Girt, DO  budesonide-formoterol  (SYMBICORT) 80-4.5 MCG/ACT inhaler Inhale 2 puffs into the lungs 2 (two) times daily. 11/09/19   Carmin Muskrat, MD  clotrimazole (LOTRIMIN) 1 % cream Apply to affected area 2 times daily 10/06/19   Rodell Perna A, PA-C  furosemide (LASIX) 20 MG tablet Take 1 tablet (20 mg total) by mouth daily. 02/10/20   Deno Etienne, DO  gabapentin (NEURONTIN) 300 MG capsule Take 300 mg by mouth 2 (two) times daily. 05/14/18   [provider]  metFORMIN (GLUCOPHAGE) 500 MG tablet Take 0.5 tablets (250 mg total) by mouth 2 (two) times daily. 02/10/20   Deno Etienne, DO  nystatin (  MYCOSTATIN/NYSTOP) powder Apply topically 3 (three) times daily. 10/19/18   Patwardhan, Reynold Bowen, MD  predniSONE (DELTASONE) 20 MG tablet 2 tabs po daily x 4 days 02/10/20   Deno Etienne, DO  rivaroxaban (XARELTO) 2.5 MG TABS tablet Take 1 tablet (2.5 mg total) by mouth 2 (two) times daily. 01/31/19   Miquel Dunn, NP  rosuvastatin (CRESTOR) 40 MG tablet Take 40 mg by mouth daily.    [provider]  sacubitril-valsartan (ENTRESTO) 24-26 MG Take 1 tablet by mouth every other day. 10/26/18   Miquel Dunn, NP    Allergies    Patient has no known allergies.  Review of Systems   Review of Systems  Constitutional: Negative for chills and fever.  HENT: Negative for congestion and facial swelling.   Eyes: Negative for discharge and visual disturbance.  Respiratory: Positive for cough and shortness of breath.   Cardiovascular: Negative for chest pain and palpitations.  Gastrointestinal: Negative for abdominal pain, diarrhea and vomiting.  Musculoskeletal: Negative for arthralgias and myalgias.  Skin: Negative for color change and rash.  Neurological: Negative for tremors, syncope and headaches.  Psychiatric/Behavioral: Negative for confusion and dysphoric mood.    Physical Exam Updated Vital Signs BP (!) 143/79   Pulse 99   Temp 98.2 F (36.8 C)   Resp (!) 27   Ht 5\' 7"  (1.702 m)   Wt 77.1 kg   SpO2  91%   BMI 26.63 kg/m   Physical Exam Vitals and nursing note reviewed.  Constitutional:      Appearance: He is well-developed.  HENT:     Head: Normocephalic and atraumatic.  Eyes:     Pupils: Pupils are equal, round, and reactive to light.  Neck:     Vascular: No JVD.  Cardiovascular:     Rate and Rhythm: Normal rate and regular rhythm.     Heart sounds: No murmur heard.  No friction rub. No gallop.   Pulmonary:     Effort: No respiratory distress.     Breath sounds: Wheezing present.     Comments: Diffuse wheezes with prolonged expiratory effort Abdominal:     General: There is no distension.     Tenderness: There is no guarding or rebound.  Musculoskeletal:        General: Normal range of motion.     Cervical back: Normal range of motion and neck supple.     Right lower leg: Edema present.     Left lower leg: Edema present.     Comments: 3+ edema to bilateral lower extremities up to the midshin.  Skin:    Coloration: Skin is not pale.     Findings: No rash.  Neurological:     Mental Status: He is alert and oriented to person, place, and time.  Psychiatric:        Behavior: Behavior normal.     ED Results / Procedures / Treatments   Labs (all labs ordered are listed, but only abnormal results are displayed) Labs Reviewed  CBC WITH DIFFERENTIAL/PLATELET - Abnormal; Notable for the following components:      Result Value   MCV 104.8 (*)    Eosinophils Absolute 0.9 (*)    All other components within normal limits  BASIC METABOLIC PANEL - Abnormal; Notable for the following components:   Glucose, Bld 166 (*)    Calcium 8.5 (*)    All other components within normal limits  RESPIRATORY PANEL BY RT PCR (FLU A&B, COVID)  BRAIN NATRIURETIC  PEPTIDE  TROPONIN I (HIGH SENSITIVITY)    EKG None  Radiology DG Chest Port 1 View  Result Date: 02/10/2020 CLINICAL DATA:  Cough, shortness of breath EXAM: PORTABLE CHEST 1 VIEW COMPARISON:  11/09/2019 FINDINGS: Prior  CABG. Heart is normal size. No confluent opacities or effusions. No acute bony abnormality. IMPRESSION: No active disease. Electronically Signed   By: Rolm Baptise M.D.   On: 02/10/2020 21:29    Procedures Procedures (including critical care time)  Medications Ordered in ED Medications  albuterol (VENTOLIN HFA) 108 (90 Base) MCG/ACT inhaler 8 puff (8 puffs Inhalation Given 02/10/20 1946)  aerochamber plus with mask device 1 each (1 each Other Given 02/10/20 2048)  ipratropium (ATROVENT HFA) inhaler 2 puff (2 puffs Inhalation Given 02/10/20 1949)  predniSONE (DELTASONE) tablet 60 mg (60 mg Oral Given 02/10/20 1950)    ED Course  I have reviewed the triage vital signs and the nursing notes.  Pertinent labs & imaging results that were available during my care of the patient were reviewed by me and considered in my medical decision making (see chart for details).    MDM Rules/Calculators/A&P                          69 yo M with a chief complaints of shortness of breath.  By history and exam this is most likely a COPD exacerbation.  Patient had improvement with beta agonists.  Ambulated here without hypoxia.  Tells me that his biggest issue is being able to afford his medications.  Put in a consult for social work.  Discharge home.  12:22 AM:  I have discussed the diagnosis/risks/treatment options with the patient and believe the pt to be eligible for discharge home to follow-up with PCP. We also discussed returning to the ED immediately if new or worsening sx occur. We discussed the sx which are most concerning (e.g., sudden worsening pain, fever, inability to tolerate by mouth) that necessitate immediate return. Medications administered to the patient during their visit and any new prescriptions provided to the patient are listed below.  Medications given during this visit Medications  albuterol (VENTOLIN HFA) 108 (90 Base) MCG/ACT inhaler 8 puff (8 puffs Inhalation Given 02/10/20 1946)    aerochamber plus with mask device 1 each (1 each Other Given 02/10/20 2048)  ipratropium (ATROVENT HFA) inhaler 2 puff (2 puffs Inhalation Given 02/10/20 1949)  predniSONE (DELTASONE) tablet 60 mg (60 mg Oral Given 02/10/20 1950)     The patient appears reasonably screen and/or stabilized for discharge and I doubt any other medical condition or other [facility] requiring further screening, evaluation, or treatment in the ED at this time prior to discharge.     Final Clinical Impression(s) / ED Diagnoses Final diagnoses:  COPD exacerbation ([place])    Rx / DC Orders ED Discharge Orders         Ordered    furosemide (LASIX) 20 MG tablet  Daily        02/10/20 2200    metFORMIN (GLUCOPHAGE) 500 MG tablet  2 times daily        02/10/20 2200    predniSONE (DELTASONE) 20 MG tablet        02/10/20 2200           Deno Etienne, DO 02/11/20 0022

## 2020-02-11 NOTE — TOC Initial Note (Signed)
Transition of Care [facility]) - Initial/Assessment Note    Patient Details  Name: Chris Payne MRN: [id number] Date of Birth: [birth year]  Transition of Care [facility]) CM/SW Contact:    Verdell Carmine, RN Phone Number: 02/11/2020, 9:14 AM  Clinical Narrative:                 Received consult from last night regarding that patient cannot afford his medications. Patient does have a health care PCP. He is on some specialized medications that are higher tier such as Entresto. He is not eligible for the [facility] program as he has insurance,however he can speak to his pharmacist and PCP regarding pharmaceutical cards that sometimes discount these medications. This Care Manager will call the patient to let him know about this.     Barriers to Discharge:  (pt states he cannot afford his medications)   Patient Goals and CMS Choice        Expected Discharge Plan and Services                                                Prior Living Arrangements/Services                       Activities of Daily Living      Permission Sought/Granted                  Emotional Assessment              Admission diagnosis:  breathing problems Patient Active Problem List   Diagnosis Date Noted  . Candida rash of groin 10/19/2018  . Acute on chronic systolic heart failure ([place]) 10/17/2018  . Acute on chronic systolic (congestive) heart failure ([place]) 10/17/2018  . COPD exacerbation ([place]) 05/21/2018  . Chronic pain syndrome 07/19/2015  . Degenerative disc disease, lumbar 07/19/2015  . Weakness 04/17/2015  . Generalized weakness 04/17/2015  . Polycythemia 04/17/2015  . BPH (benign prostatic hyperplasia) 04/17/2015  . Seizure disorder ([place]) 10/12/2014  . TIA (transient ischemic attack) 08/23/2013  . Other and unspecified angina pectoris 07/04/2013  . Nontoxic multinodular goiter 10/07/2012  . Unstable angina ([place]) 06/19/2012  . Carotid stenosis   . PAD (peripheral  artery disease) ([place])   . Left inguinal hernia 05/30/2012  . COPD (chronic obstructive pulmonary disease) ([place]) 08/24/2011  . S/P AAA repair 08/24/2011  . Lumbar disc disease   . Hypertensive heart disease   . Hyperlipidemia   . CAD (coronary artery disease)   . History of pontine CVA   . GERD (gastroesophageal reflux disease)    PCP:  Benito Mccreedy, MD Pharmacy:   [facility] [place] [address] Phone: [phone] Fax: [phone]  [facility] [place] [place] [address] [place] [address] Phone: [phone] Fax: [phone]     Social Determinants of Health (SDOH) Interventions    Readmission Risk Interventions No flowsheet data found.

## 2020-02-14 DIAGNOSIS — R9431 Abnormal electrocardiogram [ECG] [EKG]: Secondary | ICD-10-CM | POA: Diagnosis not present

## 2020-02-14 DIAGNOSIS — N138 Other obstructive and reflux uropathy: Secondary | ICD-10-CM | POA: Diagnosis not present

## 2020-03-07 ENCOUNTER — Emergency Department (HOSPITAL_COMMUNITY): Payer: Medicare Other

## 2020-03-07 ENCOUNTER — Observation Stay (HOSPITAL_COMMUNITY): Payer: Medicare Other

## 2020-03-07 ENCOUNTER — Encounter (HOSPITAL_COMMUNITY): Payer: Self-pay | Admitting: Internal Medicine

## 2020-03-07 ENCOUNTER — Observation Stay (HOSPITAL_COMMUNITY)
Admission: EM | Admit: 2020-03-07 | Discharge: 2020-03-08 | Disposition: A | Discharge status: Home / Self Care | Payer: Medicare Other | Attending: Internal Medicine | Admitting: Internal Medicine

## 2020-03-07 ENCOUNTER — Other Ambulatory Visit: Payer: Self-pay

## 2020-03-07 ENCOUNTER — Observation Stay (HOSPITAL_BASED_OUTPATIENT_CLINIC_OR_DEPARTMENT_OTHER): Payer: Medicare Other

## 2020-03-07 DIAGNOSIS — Z23 Encounter for immunization: Secondary | ICD-10-CM | POA: Insufficient documentation

## 2020-03-07 DIAGNOSIS — Z951 Presence of aortocoronary bypass graft: Secondary | ICD-10-CM | POA: Insufficient documentation

## 2020-03-07 DIAGNOSIS — G40909 Epilepsy, unspecified, not intractable, without status epilepticus: Secondary | ICD-10-CM

## 2020-03-07 DIAGNOSIS — I739 Peripheral vascular disease, unspecified: Secondary | ICD-10-CM | POA: Diagnosis not present

## 2020-03-07 DIAGNOSIS — R0902 Hypoxemia: Secondary | ICD-10-CM

## 2020-03-07 DIAGNOSIS — Z87891 Personal history of nicotine dependence: Secondary | ICD-10-CM | POA: Insufficient documentation

## 2020-03-07 DIAGNOSIS — M79609 Pain in unspecified limb: Secondary | ICD-10-CM

## 2020-03-07 DIAGNOSIS — Z7984 Long term (current) use of oral hypoglycemic drugs: Secondary | ICD-10-CM | POA: Insufficient documentation

## 2020-03-07 DIAGNOSIS — I2510 Atherosclerotic heart disease of native coronary artery without angina pectoris: Secondary | ICD-10-CM | POA: Diagnosis not present

## 2020-03-07 DIAGNOSIS — Z9889 Other specified postprocedural states: Secondary | ICD-10-CM

## 2020-03-07 DIAGNOSIS — I110 Hypertensive heart disease with heart failure: Secondary | ICD-10-CM | POA: Diagnosis not present

## 2020-03-07 DIAGNOSIS — R000 Tachycardia, unspecified: Secondary | ICD-10-CM | POA: Diagnosis not present

## 2020-03-07 DIAGNOSIS — K8020 Calculus of gallbladder without cholecystitis without obstruction: Secondary | ICD-10-CM | POA: Diagnosis not present

## 2020-03-07 DIAGNOSIS — R0602 Shortness of breath: Secondary | ICD-10-CM | POA: Diagnosis not present

## 2020-03-07 DIAGNOSIS — I10 Essential (primary) hypertension: Secondary | ICD-10-CM | POA: Diagnosis not present

## 2020-03-07 DIAGNOSIS — Z20822 Contact with and (suspected) exposure to covid-19: Secondary | ICD-10-CM | POA: Insufficient documentation

## 2020-03-07 DIAGNOSIS — Z8673 Personal history of transient ischemic attack (TIA), and cerebral infarction without residual deficits: Secondary | ICD-10-CM

## 2020-03-07 DIAGNOSIS — L89150 Pressure ulcer of sacral region, unstageable: Secondary | ICD-10-CM | POA: Diagnosis present

## 2020-03-07 DIAGNOSIS — R062 Wheezing: Secondary | ICD-10-CM | POA: Diagnosis not present

## 2020-03-07 DIAGNOSIS — Z79899 Other long term (current) drug therapy: Secondary | ICD-10-CM | POA: Insufficient documentation

## 2020-03-07 DIAGNOSIS — K7689 Other specified diseases of liver: Secondary | ICD-10-CM | POA: Diagnosis not present

## 2020-03-07 DIAGNOSIS — Z8679 Personal history of other diseases of the circulatory system: Secondary | ICD-10-CM

## 2020-03-07 DIAGNOSIS — I509 Heart failure, unspecified: Secondary | ICD-10-CM | POA: Insufficient documentation

## 2020-03-07 DIAGNOSIS — Z7982 Long term (current) use of aspirin: Secondary | ICD-10-CM | POA: Diagnosis not present

## 2020-03-07 DIAGNOSIS — M7989 Other specified soft tissue disorders: Secondary | ICD-10-CM

## 2020-03-07 DIAGNOSIS — R7989 Other specified abnormal findings of blood chemistry: Secondary | ICD-10-CM | POA: Diagnosis not present

## 2020-03-07 DIAGNOSIS — I517 Cardiomegaly: Secondary | ICD-10-CM | POA: Diagnosis not present

## 2020-03-07 DIAGNOSIS — J441 Chronic obstructive pulmonary disease with (acute) exacerbation: Principal | ICD-10-CM | POA: Insufficient documentation

## 2020-03-07 DIAGNOSIS — J9811 Atelectasis: Secondary | ICD-10-CM | POA: Diagnosis not present

## 2020-03-07 DIAGNOSIS — J438 Other emphysema: Secondary | ICD-10-CM

## 2020-03-07 DIAGNOSIS — R109 Unspecified abdominal pain: Secondary | ICD-10-CM | POA: Insufficient documentation

## 2020-03-07 DIAGNOSIS — N200 Calculus of kidney: Secondary | ICD-10-CM | POA: Diagnosis not present

## 2020-03-07 LAB — URINALYSIS, ROUTINE W REFLEX MICROSCOPIC
Bilirubin Urine: NEGATIVE
Glucose, UA: NEGATIVE mg/dL
Hgb urine dipstick: NEGATIVE
Ketones, ur: NEGATIVE mg/dL
Nitrite: NEGATIVE
Protein, ur: NEGATIVE mg/dL
Specific Gravity, Urine: 1.01 (ref 1.005–1.030)
pH: 7 (ref 5.0–8.0)

## 2020-03-07 LAB — CBC
HCT: 47.7 % (ref 39.0–52.0)
Hemoglobin: 14.9 g/dL (ref 13.0–17.0)
MCH: 33.1 pg (ref 26.0–34.0)
MCHC: 31.2 g/dL (ref 30.0–36.0)
MCV: 106 fL — ABNORMAL HIGH (ref 80.0–100.0)
Platelets: 161 10*3/uL (ref 150–400)
RBC: 4.5 MIL/uL (ref 4.22–5.81)
RDW: 12.9 % (ref 11.5–15.5)
WBC: 6.3 10*3/uL (ref 4.0–10.5)
nRBC: 0 % (ref 0.0–0.2)

## 2020-03-07 LAB — D-DIMER, QUANTITATIVE: D-Dimer, Quant: 4.83 ug/mL-FEU — ABNORMAL HIGH (ref 0.00–0.50)

## 2020-03-07 LAB — HEMOGLOBIN A1C
Hgb A1c MFr Bld: 6 % — ABNORMAL HIGH (ref 4.8–5.6)
Mean Plasma Glucose: 125.5 mg/dL

## 2020-03-07 LAB — CBC WITH DIFFERENTIAL/PLATELET
Abs Immature Granulocytes: 0.02 10*3/uL (ref 0.00–0.07)
Basophils Absolute: 0.1 10*3/uL (ref 0.0–0.1)
Basophils Relative: 1 %
Eosinophils Absolute: 1 10*3/uL — ABNORMAL HIGH (ref 0.0–0.5)
Eosinophils Relative: 14 %
HCT: 48 % (ref 39.0–52.0)
Hemoglobin: 14.9 g/dL (ref 13.0–17.0)
Immature Granulocytes: 0 %
Lymphocytes Relative: 34 %
Lymphs Abs: 2.4 10*3/uL (ref 0.7–4.0)
MCH: 33.2 pg (ref 26.0–34.0)
MCHC: 31 g/dL (ref 30.0–36.0)
MCV: 106.9 fL — ABNORMAL HIGH (ref 80.0–100.0)
Monocytes Absolute: 0.5 10*3/uL (ref 0.1–1.0)
Monocytes Relative: 8 %
Neutro Abs: 3 10*3/uL (ref 1.7–7.7)
Neutrophils Relative %: 43 %
Platelets: 162 10*3/uL (ref 150–400)
RBC: 4.49 MIL/uL (ref 4.22–5.81)
RDW: 12.8 % (ref 11.5–15.5)
WBC: 7 10*3/uL (ref 4.0–10.5)
nRBC: 0 % (ref 0.0–0.2)

## 2020-03-07 LAB — BASIC METABOLIC PANEL
Anion gap: 12 (ref 5–15)
BUN: 16 mg/dL (ref 8–23)
CO2: 30 mmol/L (ref 22–32)
Calcium: 9.5 mg/dL (ref 8.9–10.3)
Chloride: 100 mmol/L (ref 98–111)
Creatinine, Ser: 1.22 mg/dL (ref 0.61–1.24)
GFR, Estimated: >60 mL/min (ref >60)
Glucose, Bld: 140 mg/dL — ABNORMAL HIGH (ref 70–99)
Potassium: 3.6 mmol/L (ref 3.5–5.1)
Sodium: 142 mmol/L (ref 135–145)

## 2020-03-07 LAB — CREATININE, SERUM
Creatinine, Ser: 1.3 mg/dL — ABNORMAL HIGH (ref 0.61–1.24)
GFR, Estimated: 59 mL/min — ABNORMAL LOW (ref >60)

## 2020-03-07 LAB — GLUCOSE, CAPILLARY: Glucose-Capillary: 210 mg/dL — ABNORMAL HIGH (ref 70–99)

## 2020-03-07 LAB — CBG MONITORING, ED
Glucose-Capillary: 196 mg/dL — ABNORMAL HIGH (ref 70–99)
Glucose-Capillary: 225 mg/dL — ABNORMAL HIGH (ref 70–99)

## 2020-03-07 LAB — RESPIRATORY PANEL BY RT PCR (FLU A&B, COVID)
Influenza A by PCR: NEGATIVE
Influenza B by PCR: NEGATIVE
SARS Coronavirus 2 by RT PCR: NEGATIVE

## 2020-03-07 LAB — TROPONIN I (HIGH SENSITIVITY): Troponin I (High Sensitivity): 10 ng/L (ref <18)

## 2020-03-07 LAB — HIV ANTIBODY (ROUTINE TESTING W REFLEX): HIV Screen 4th Generation wRfx: NONREACTIVE

## 2020-03-07 LAB — BRAIN NATRIURETIC PEPTIDE: B Natriuretic Peptide: 40.5 pg/mL (ref 0.0–100.0)

## 2020-03-07 MED ORDER — IPRATROPIUM BROMIDE 0.02 % IN SOLN
0.5000 mg | Freq: Once | RESPIRATORY_TRACT | Status: AC
  Administered 2020-03-07: 0.5 mg via RESPIRATORY_TRACT
  Filled 2020-03-07: qty 2.5

## 2020-03-07 MED ORDER — GABAPENTIN 300 MG PO CAPS
300.0000 mg | ORAL_CAPSULE | Freq: Two times a day (BID) | ORAL | Status: DC
  Administered 2020-03-07 – 2020-03-08 (×3): 300 mg via ORAL
  Filled 2020-03-07 (×3): qty 1

## 2020-03-07 MED ORDER — PREDNISONE 20 MG PO TABS
40.0000 mg | ORAL_TABLET | Freq: Every day | ORAL | Status: DC
  Administered 2020-03-08: 40 mg via ORAL
  Filled 2020-03-07: qty 2

## 2020-03-07 MED ORDER — ENOXAPARIN SODIUM 40 MG/0.4ML ~~LOC~~ SOLN: 40.0000 mg | SUBCUTANEOUS | Status: DC

## 2020-03-07 MED ORDER — IOHEXOL 350 MG/ML SOLN
100.0000 mL | Freq: Once | INTRAVENOUS | Status: AC | PRN
  Administered 2020-03-07: 52 mL via INTRAVENOUS

## 2020-03-07 MED ORDER — LABETALOL HCL 5 MG/ML IV SOLN
10.0000 mg | INTRAVENOUS | Status: DC | PRN
  Filled 2020-03-07: qty 4

## 2020-03-07 MED ORDER — RIVAROXABAN 2.5 MG PO TABS: 2.5000 mg | ORAL_TABLET | Freq: Two times a day (BID) | ORAL | Status: DC

## 2020-03-07 MED ORDER — ALBUTEROL SULFATE (2.5 MG/3ML) 0.083% IN NEBU: 2.5000 mg | INHALATION_SOLUTION | RESPIRATORY_TRACT | Status: DC | PRN

## 2020-03-07 MED ORDER — SACUBITRIL-VALSARTAN 24-26 MG PO TABS
1.0000 | ORAL_TABLET | ORAL | Status: DC
  Administered 2020-03-07: 1 via ORAL
  Filled 2020-03-07: qty 1

## 2020-03-07 MED ORDER — FUROSEMIDE 20 MG PO TABS
20.0000 mg | ORAL_TABLET | Freq: Every day | ORAL | Status: DC
  Administered 2020-03-07 – 2020-03-08 (×2): 20 mg via ORAL
  Filled 2020-03-07 (×2): qty 1

## 2020-03-07 MED ORDER — BUDESONIDE 0.25 MG/2ML IN SUSP
0.2500 mg | Freq: Two times a day (BID) | RESPIRATORY_TRACT | Status: DC
  Administered 2020-03-07 – 2020-03-08 (×3): 0.25 mg via RESPIRATORY_TRACT
  Filled 2020-03-07 (×6): qty 2

## 2020-03-07 MED ORDER — INSULIN ASPART 100 UNIT/ML ~~LOC~~ SOLN
0.0000 [IU] | Freq: Three times a day (TID) | SUBCUTANEOUS | Status: DC
  Administered 2020-03-07: 2 [IU] via SUBCUTANEOUS
  Administered 2020-03-07: 3 [IU] via SUBCUTANEOUS
  Administered 2020-03-08: 2 [IU] via SUBCUTANEOUS

## 2020-03-07 MED ORDER — ALBUTEROL SULFATE (2.5 MG/3ML) 0.083% IN NEBU
2.5000 mg | INHALATION_SOLUTION | RESPIRATORY_TRACT | Status: DC
  Administered 2020-03-07 – 2020-03-08 (×7): 2.5 mg via RESPIRATORY_TRACT
  Filled 2020-03-07 (×7): qty 3

## 2020-03-07 MED ORDER — PNEUMOCOCCAL VAC POLYVALENT 25 MCG/0.5ML IJ INJ
0.5000 mL | INJECTION | INTRAMUSCULAR | Status: AC
  Administered 2020-03-08: 0.5 mL via INTRAMUSCULAR
  Filled 2020-03-07: qty 0.5

## 2020-03-07 MED ORDER — ALBUTEROL SULFATE (2.5 MG/3ML) 0.083% IN NEBU
5.0000 mg | INHALATION_SOLUTION | Freq: Once | RESPIRATORY_TRACT | Status: AC
  Administered 2020-03-07: 5 mg via RESPIRATORY_TRACT
  Filled 2020-03-07: qty 6

## 2020-03-07 MED ORDER — SODIUM CHLORIDE 0.9 % IV SOLN
1.0000 g | INTRAVENOUS | Status: DC
  Administered 2020-03-08: 1 g via INTRAVENOUS
  Filled 2020-03-07: qty 1
  Filled 2020-03-07: qty 10

## 2020-03-07 MED ORDER — ACETAMINOPHEN 650 MG RE SUPP: 650.0000 mg | Freq: Four times a day (QID) | RECTAL | Status: DC | PRN

## 2020-03-07 MED ORDER — ACETAMINOPHEN 325 MG PO TABS
650.0000 mg | ORAL_TABLET | Freq: Four times a day (QID) | ORAL | Status: DC | PRN
  Administered 2020-03-07 (×2): 650 mg via ORAL
  Filled 2020-03-07 (×2): qty 2

## 2020-03-07 MED ORDER — ATORVASTATIN CALCIUM 80 MG PO TABS: 80.0000 mg | ORAL_TABLET | Freq: Every day | ORAL | Status: DC

## 2020-03-07 MED ORDER — SODIUM CHLORIDE 0.9 % IV SOLN
1.0000 g | Freq: Once | INTRAVENOUS | Status: AC
  Administered 2020-03-07: 1 g via INTRAVENOUS
  Filled 2020-03-07: qty 10

## 2020-03-07 MED ORDER — ROSUVASTATIN CALCIUM 20 MG PO TABS
40.0000 mg | ORAL_TABLET | Freq: Every day | ORAL | Status: DC
  Administered 2020-03-07 – 2020-03-08 (×2): 40 mg via ORAL
  Filled 2020-03-07 (×2): qty 2

## 2020-03-07 MED ORDER — IPRATROPIUM BROMIDE 0.02 % IN SOLN
0.5000 mg | RESPIRATORY_TRACT | Status: DC
  Administered 2020-03-07 – 2020-03-08 (×6): 0.5 mg via RESPIRATORY_TRACT
  Filled 2020-03-07 (×6): qty 2.5

## 2020-03-07 NOTE — ED Notes (Signed)
Pt back from ct

## 2020-03-07 NOTE — ED Provider Notes (Signed)
TIME SEEN: 2:33 AM  CHIEF COMPLAINT: Shortness of breath, cough  HPI: Patient is a 69 year old male with history of AAA, CAD status post CABG, COPD, CHF, hypertension, hyperlipidemia, CVA who presents to the emergency department EMS from home with shortness of breath and cough with yellow sputum production that started tonight.  Does not wear oxygen chronically.  Given 1-1/2 duo nebs and 125 mg of IV Solu-Medrol with EMS.  Reports feeling better.  Currently on room air.  Denies any chest pain or chest discomfort.  No fever.  He is unvaccinated for COVID-19.  No lower extremity swelling or pain.  ROS: See HPI Constitutional: no fever  Eyes: no drainage  ENT: no runny nose   Cardiovascular:  no chest pain  Resp:  SOB  GI: no vomiting GU: no dysuria Integumentary: no rash  Allergy: no hives  Musculoskeletal: no leg swelling  Neurological: no slurred speech ROS otherwise negative  PAST MEDICAL HISTORY/PAST SURGICAL HISTORY:  Past Medical History:  Diagnosis Date  . AAA (abdominal aortic aneurysm) ([place])    s/p repair 6/11  . CAD (coronary artery disease)    s/p CABG 2/12:  LIMA to LAD, SVG to diagonal-1, SVG to ramus intermedius,, SVG to AM (Dr. Roxan Hockey) ;  b.  Myoview 10/13:  inf infarct with very mild peri-infarct ischemia, EF 49%, inf HK; c  He has severe three-vessel native coronary artery disease. Catheterization March 2015 as above  . CAP (community acquired pneumonia) 02/23/2014  . Carotid stenosis    a.  dopplers 9/47: LICA 09-62%;  b. Carotid dopplers 3/14:  R 0-39%, L 60-79%, repeat 6 mos  . CHF (congestive heart failure) ([place])   . COPD (chronic obstructive pulmonary disease) ([place])   . GERD (gastroesophageal reflux disease)   . Headache(784.0)   . Hyperlipidemia   . Hypertension   . Hypoxia 02/23/2014  . Inguinal hernia   . Myocardial infarction [facility])    PCI x3  . PAD (peripheral artery disease) ([place])    ABIs 4/12:  R 0.88, L 0.92; R SFA 40%, L CFA 50%, L SFA 50-60%   . Pneumonia    hx  . Stroke [facility])    h/o pontine CVA  . Thyroid nodule    incidental finding on carotid doppler 3/14 => thyroid U/S ordered    MEDICATIONS:  Prior to Admission medications   Medication Sig Start Date End Date Taking? Authorizing Provider  albuterol (VENTOLIN HFA) 108 (90 Base) MCG/ACT inhaler Inhale 2 puffs into the lungs every 6 (six) hours as needed for wheezing or shortness of breath. 11/09/19   Carmin Muskrat, MD  aspirin EC 81 MG EC tablet Take 1 tablet (81 mg total) by mouth daily. 05/24/18   Geradine Girt, DO  budesonide-formoterol (SYMBICORT) 80-4.5 MCG/ACT inhaler Inhale 2 puffs into the lungs 2 (two) times daily. 11/09/19   Carmin Muskrat, MD  clotrimazole (LOTRIMIN) 1 % cream Apply to affected area 2 times daily 10/06/19   Rodell Perna A, PA-C  furosemide (LASIX) 20 MG tablet Take 1 tablet (20 mg total) by mouth daily. 02/10/20   Deno Etienne, DO  gabapentin (NEURONTIN) 300 MG capsule Take 300 mg by mouth 2 (two) times daily. 05/14/18   [provider]  metFORMIN (GLUCOPHAGE) 500 MG tablet Take 0.5 tablets (250 mg total) by mouth 2 (two) times daily. 02/10/20   Deno Etienne, DO  nystatin (MYCOSTATIN/NYSTOP) powder Apply topically 3 (three) times daily. 10/19/18   Patwardhan, Reynold Bowen, MD  predniSONE (DELTASONE) 20  MG tablet 2 tabs po daily x 4 days 02/10/20   Deno Etienne, DO  rivaroxaban (XARELTO) 2.5 MG TABS tablet Take 1 tablet (2.5 mg total) by mouth 2 (two) times daily. 01/31/19   Miquel Dunn, NP  rosuvastatin (CRESTOR) 40 MG tablet Take 40 mg by mouth daily.    [provider]  sacubitril-valsartan (ENTRESTO) 24-26 MG Take 1 tablet by mouth every other day. 10/26/18   Miquel Dunn, NP    ALLERGIES:  No Known Allergies  SOCIAL HISTORY:  Social History   Tobacco Use  . Smoking status: Former Smoker    Packs/day: 1.00    Years: 30.00    Pack years: 30.00    Types: Cigarettes    Quit date: 01/25/2010    Years since  quitting: 10.1  . Smokeless tobacco: Never Used  . Tobacco comment: "havent smoked in 7 years"  Substance Use Topics  . Alcohol use: No    Comment: former drinker - Quit 2011.    FAMILY HISTORY: Family History  Problem Relation Age of Onset  . Alcohol abuse Father   . Cancer Sister 70       unknown primary  . Diabetes Mother   . Hypertension Mother   . Cancer Sister 20       kidney cancer    EXAM: BP 129/84   Pulse (!) 105   Temp 97.6 F (36.4 C)   Resp 13   Ht 5\' 7"  (1.702 m)   Wt 77.1 kg   SpO2 94%   BMI 26.63 kg/m  CONSTITUTIONAL: Alert and oriented and responds appropriately to questions.  Elderly, chronically ill-appearing HEAD: Normocephalic EYES: Conjunctivae clear, pupils appear equal, EOM appear intact ENT: normal nose; moist mucous membranes NECK: Supple, normal ROM CARD: Regular and minimally tachycardic; S1 and S2 appreciated; no murmurs, no clicks, no rubs, no gallops RESP: Normal chest excursion without splinting or tachypnea; breath sounds equal bilaterally, minimal scattered expiratory wheezes on exam and diminished aeration at his bases bilaterally, no rhonchi, no rales, no hypoxia or respiratory distress, speaking full sentences ABD/GI: Normal bowel sounds; non-distended; soft, non-tender, no rebound, no guarding, no peritoneal signs, no hepatosplenomegaly BACK:  The back appears normal EXT: Normal ROM in all joints; no deformity noted, no edema; no cyanosis, no calf tenderness or calf swelling SKIN: Normal color for age and race; warm; no rash on exposed skin NEURO: Moves all extremities equally PSYCH: The patient's mood and manner are appropriate.   MEDICAL DECISION MAKING: Patient here with COPD exacerbation versus CHF exacerbation.  He does not appear significantly volume overloaded however at this time.  No complaints of chest pain.  Doubt ACS.  Does report productive cough but is afebrile without other infectious symptoms at home.  Labs, chest  x-ray pending.  EKG shows no new ischemic change.  Reports feeling much better after breathing treatments and Solu-Medrol with EMS.  Will give another breathing treatment here in the ED.  Currently on room air.  If continues to improve without hypoxia, anticipate discharge home.  ED PROGRESS: Patient reports feeling better but still having scattered expiratory wheezing and diminished aeration.  Sats 90 to 92% on room air at rest.  Sats dropped to 88% with ambulation in the room.  Will admit for COPD exacerbation for further treatments and steroids.  Labs reassuring.  BNP normal.  Chest x-ray clear.  4:53 AM Discussed patient's case with hospitalist, Dr. Hal Hope.  I have recommended admission and patient (and family if  present) agree with this plan. Admitting physician will place admission orders.   I reviewed all nursing notes, vitals, pertinent previous records and reviewed/interpreted all EKGs, lab and urine results, imaging (as available).    EKG Interpretation  Date/Time:  Thursday March 07 2020 02:30:00 EST Ventricular Rate:  105 PR Interval:    QRS Duration: 100 QT Interval:  353 QTC Calculation: 467 R Axis:   -7 Text Interpretation: Sinus tachycardia Consider inferior infarct No significant change since last tracing Confirmed by Shaunda Tipping, Cyril Mourning [phone]) on 03/07/2020 2:33:42 AM        CRITICAL CARE Performed by: Cyril Mourning Lisia [place]   Total critical care time: 45 minutes  Critical care time was exclusive of separately billable procedures and treating other patients.  Critical care was necessary to treat or prevent imminent or life-threatening deterioration.  Critical care was time spent personally by me on the following activities: development of treatment plan with patient and/or surrogate as well as nursing, discussions with consultants, evaluation of patient's response to treatment, examination of patient, obtaining history from patient or surrogate, ordering and performing  treatments and interventions, ordering and review of laboratory studies, ordering and review of radiographic studies, pulse oximetry and re-evaluation of patient's condition.   Chris Payne was evaluated in Emergency Department on 03/07/2020 for the symptoms described in the history of present illness. He was evaluated in the context of the global COVID-19 pandemic, which necessitated consideration that the patient might be at risk for infection with the SARS-CoV-2 virus that causes COVID-19. Institutional protocols and algorithms that pertain to the evaluation of patients at risk for COVID-19 are in a state of rapid change based on information released by regulatory bodies including the CDC and federal and state organizations. These policies and algorithms were followed during the patient's care in the ED.      Chris Payne, Delice Bison, DO 03/07/20 [phone]

## 2020-03-07 NOTE — Plan of Care (Signed)
  Problem: Clinical Measurements: Goal: Respiratory complications will improve Outcome: Progressing   Problem: Clinical Measurements: Goal: Cardiovascular complication will be avoided Outcome: Progressing   Problem: Nutrition: Goal: Adequate nutrition will be maintained Outcome: Progressing   Problem: Coping: Goal: Level of anxiety will decrease Outcome: Progressing   Problem: Elimination: Goal: Will not experience complications related to bowel motility Outcome: Progressing   Problem: Elimination: Goal: Will not experience complications related to urinary retention Outcome: Progressing   Problem: Pain Managment: Goal: General experience of comfort will improve Outcome: Progressing   Problem: Respiratory: Goal: Ability to maintain a clear airway will improve Outcome: Progressing

## 2020-03-07 NOTE — Progress Notes (Signed)
PROGRESS NOTE    Chris Payne  TKW:[id number] DOB: 1950-11-02 DOA: 03/07/2020 PCP: Benito Mccreedy, MD    Brief Narrative:  Chris Payne is a 69 year old male with past medical history significant for COPD, CAD s/p CABG, abdominal aortic aneurysm s/p repair, essential hypertension, seizure disorder, chronic systolic congestive heart failure, who presented to the ED with progressive shortness of breath.  Onset roughly 1 week ago with associated wheezing and productive cough.  Recently seen by PCP, started on prednisone over the last 3-4 days with no significant improvement.  In the ED, temperature 97.6, HR 105, RR 21, BP 129/84. Patient was noted to be significantly dyspneic with increased work of breathing, wheezing bilaterally.  Chest x-ray with no acute cardiopulmonary disease process.  BNP 40.  Patient was started on IV Solu-Medrol, neb treatments and admitted for COPD exacerbation.  TRH consulted for further evaluation and admission.   Assessment & Plan:   Principal Problem:   COPD exacerbation ([place]) Active Problems:   S/P AAA repair   History of pontine CVA   PAD (peripheral artery disease) ([facility])   Seizure disorder ([facility])   COPD exacerbation Patient presenting with 1 week history of progressive shortness of breath associated with productive cough and wheezing.  Recently seen by PCP and started on prednisone without any appreciable improvement.  Home regimen includes Symbicort, albuterol MDI as needed.  He is not oxygen dependent.  Chest x-ray on admission with no acute cardiopulmonary disease process, but did note significant wheezing, increased respiratory effort and conversational dyspnea. --Continue antibiotics with ceftriaxone --Prednisone 40 mg p.o. daily --Albuterol neb every 4 hours --Atrovent neb --Pulmicort twice daily --Supplemental oxygen, maintain SPO2 greater than 88%  Elevated D-dimer D-dimer elevated at 4.83.  Noted bilateral lower extremity edema.   Bilateral duplex venous ultrasound lower extremities negative for DVT.  CT angiogram chest negative for pulmonary embolism.  Essential hypertension CAD s/p CABG 2012 Chronic combined systolic diastolic congestive heart failure, compensated BNP 40.5 with chest x-ray with no acute cardiopulmonary disease findings.  Last TTE 09/22/2018 with LVEF 40%, mild concentric LVH, mild global hypokinesis, grade 1 diastolic dysfunction, AR.  Follows with [facility] cardiovascular outpatient.Marland Kitchen Delene Loll 24-26 mg p.o. every other day --Crestor 40 mg p.o. daily --Furosemide 20 mg p.o. daily  Type 2 diabetes mellitus Hemoglobin A1c 6.0, well controlled.  On Metformin 250mg  PO BID. --Hold Metformin while inpatient --Insulin sliding scale for further coverage --CBGs qAC/HS  Aortic root dialation 44 mm by TTE.  Continue follow-up with cardiology.  Peripheral artery disease --Continue aspirin 81 mg p.o. daily --Xarelto 2.5 mg p.o. twice daily  Weakness/deconditioning/gait disturbance: Utilizes a walker at baseline.  Lives with girlfriend. --PT/OT consultation  DVT prophylaxis: Lovenox Code Status: Full code Family Communication: No family present at bedside  Disposition Plan:  Status is: Observation  The patient remains OBS appropriate and will d/c before 2 midnights.  Dispo: The patient is from: Home              Anticipated d/c is to: Home              Anticipated d/c date is: 1 day              Patient currently is not medically stable to d/c.   Consultants:   None  Procedures:   Vascular duplex ultrasound bilateral lower extremities: Negative for DVT  Antimicrobials:   Ceftriaxone   Subjective: Patient seen and examined bedside, resting comfortably.  Breathing slightly improved, but not back  to his normal baseline.  Discussed with patient elevated D-dimer and concern of blood clot either in his legs or chest, and agrees with further evaluation with CT chest and ultrasound duplex  lower extremities.  No other complaints or concerns at this time.  Denies headache, no dizziness, no chest pain, no abdominal pain, no fever/chills/night sweats, no nausea/vomiting/diarrhea, no paresthesias.  No acute concerns overnight per nursing staff.  Objective: Vitals:   03/07/20 1315 03/07/20 1330 03/07/20 1345 03/07/20 1400  BP: (!) 158/83 139/72 125/70 133/71  Pulse: (!) 118 (!) 116 (!) 108 (!) 108  Resp: (!) 22 (!) 28 (!) 24 20  Temp:      TempSrc:      SpO2: 97% 96% 100% 93%  Weight:      Height:       No intake or output data in the 24 hours ending 03/07/20 1413 Filed Weights   03/07/20 0223  Weight: 77.1 kg    Examination:  General exam: Appears calm and comfortable  Respiratory system: Clear to auscultation. Respiratory effort normal. Cardiovascular system: S1 & S2 heard, RRR. No JVD, murmurs, rubs, gallops or clicks.  1+ pitting edema bilateral lower extremities to knee Gastrointestinal system: Abdomen is nondistended, soft and nontender. No organomegaly or masses felt. Normal bowel sounds heard. Central nervous system: Alert and oriented. No focal neurological deficits. Extremities: Symmetric 5 x 5 power. Skin: No rashes, lesions or ulcers Psychiatry: Judgement and insight appear normal. Mood & affect appropriate.     Data Reviewed: I have personally reviewed following labs and imaging studies  CBC: Recent Labs  Lab 03/07/20 0233 03/07/20 0629  WBC 7.0 6.3  NEUTROABS 3.0  --   HGB 14.9 14.9  HCT 48.0 47.7  MCV 106.9* 106.0*  PLT 162 557   Basic Metabolic Panel: Recent Labs  Lab 03/07/20 0233 03/07/20 0629  NA 142  --   K 3.6  --   CL 100  --   CO2 30  --   GLUCOSE 140*  --   BUN 16  --   CREATININE 1.22 1.30*  CALCIUM 9.5  --    GFR: Estimated Creatinine Clearance: 49.4 mL/min (A) (by C-G formula based on SCr of 1.3 mg/dL (H)). Liver Function Tests: No results for input(s): AST, ALT, ALKPHOS, BILITOT, PROT, ALBUMIN in the last 168  hours. No results for input(s): LIPASE, AMYLASE in the last 168 hours. No results for input(s): AMMONIA in the last 168 hours. Coagulation Profile: No results for input(s): INR, PROTIME in the last 168 hours. Cardiac Enzymes: No results for input(s): CKTOTAL, CKMB, CKMBINDEX, TROPONINI in the last 168 hours. BNP (last 3 results) No results for input(s): PROBNP in the last 8760 hours. HbA1C: Recent Labs    03/07/20 0629  HGBA1C 6.0*   CBG: Recent Labs  Lab 03/07/20 0846 03/07/20 1207  GLUCAP 196* 225*   Lipid Profile: No results for input(s): CHOL, HDL, LDLCALC, TRIG, CHOLHDL, LDLDIRECT in the last 72 hours. Thyroid Function Tests: No results for input(s): TSH, T4TOTAL, FREET4, T3FREE, THYROIDAB in the last 72 hours. Anemia Panel: No results for input(s): VITAMINB12, FOLATE, FERRITIN, TIBC, IRON, RETICCTPCT in the last 72 hours. Sepsis Labs: No results for input(s): PROCALCITON, LATICACIDVEN in the last 168 hours.  Recent Results (from the past 240 hour(s))  Respiratory Panel by RT PCR (Flu A&B, Covid) - Nasopharyngeal Swab     Status: None   Collection Time: 03/07/20  5:32 AM   Specimen: Nasopharyngeal Swab  Result Value  Ref Range Status   SARS Coronavirus 2 by RT PCR NEGATIVE NEGATIVE Final    Comment: (NOTE) SARS-CoV-2 target nucleic acids are NOT DETECTED.  The SARS-CoV-2 RNA is generally detectable in upper respiratoy specimens during the acute phase of infection. The lowest concentration of SARS-CoV-2 viral copies this assay can detect is 131 copies/mL. A negative result does not preclude SARS-Cov-2 infection and should not be used as the sole basis for treatment or other patient management decisions. A negative result may occur with  improper specimen collection/handling, submission of specimen other than nasopharyngeal swab, presence of viral mutation(s) within the areas targeted by this assay, and inadequate number of viral copies (<131 copies/mL). A negative  result must be combined with clinical observations, patient history, and epidemiological information. The expected result is Negative.  Fact Sheet for Patients:  PinkCheek.be  Fact Sheet for Healthcare Providers:  GravelBags.it  This test is no t yet approved or cleared by the [place] FDA and  has been authorized for detection and/or diagnosis of SARS-CoV-2 by FDA under an Emergency Use Authorization (EUA). This EUA will remain  in effect (meaning this test can be used) for the duration of the COVID-19 declaration under Section 564(b)(1) of the Act, 21 U.S.C. section 360bbb-3(b)(1), unless the authorization is terminated or revoked sooner.     Influenza A by PCR NEGATIVE NEGATIVE Final   Influenza B by PCR NEGATIVE NEGATIVE Final    Comment: (NOTE) The Xpert Xpress SARS-CoV-2/FLU/RSV assay is intended as an aid in  the diagnosis of influenza from Nasopharyngeal swab specimens and  should not be used as a sole basis for treatment. Nasal washings and  aspirates are unacceptable for Xpert Xpress SARS-CoV-2/FLU/RSV  testing.  Fact Sheet for Patients: PinkCheek.be  Fact Sheet for Healthcare Providers: GravelBags.it  This test is not yet approved or cleared by the [place] FDA and  has been authorized for detection and/or diagnosis of SARS-CoV-2 by  FDA under an Emergency Use Authorization (EUA). This EUA will remain  in effect (meaning this test can be used) for the duration of the  Covid-19 declaration under Section 564(b)(1) of the Act, 21  U.S.C. section 360bbb-3(b)(1), unless the authorization is  terminated or revoked. Performed at [facility], [place] [address], [place], [place] [address]          Radiology Studies: CT ANGIO CHEST PE W OR WO CONTRAST  Result Date: 03/07/2020 CLINICAL DATA:  Evaluate for pulmonary embolus. Low to  intermediate probability. Positive D-dimer. EXAM: CT ANGIOGRAPHY CHEST WITH CONTRAST TECHNIQUE: Multidetector CT imaging of the chest was performed using the standard protocol during bolus administration of intravenous contrast. Multiplanar CT image reconstructions and MIPs were obtained to evaluate the vascular anatomy. CONTRAST:  51mL OMNIPAQUE IOHEXOL 350 MG/ML SOLN COMPARISON:  None. FINDINGS: Cardiovascular: The main pulmonary artery appears patent. No lobar or segmental pulmonary artery filling defects identified The heart size is normal. Aortic atherosclerotic disease identified. Previous CABG. No pericardial effusion identified. Mediastinum/Nodes: Normal appearance of the thyroid gland. The trachea appears patent and is midline. Normal appearance of the esophagus. No enlarged supraclavicular, axillary, mediastinal, or hilar lymph nodes. Lungs/Pleura: No pleural effusion. Centrilobular and paraseptal emphysema. No airspace consolidation, or pneumothorax. Dependent changes with subsegmental atelectasis and ground-glass attenuation noted within the lung bases. Upper Abdomen: Multiple liver cysts are identified. No acute findings within the imaged portions of the upper abdomen. Musculoskeletal: No chest wall abnormality. No acute or significant osseous findings. Review of the MIP images  confirms the above findings. IMPRESSION: 1. No evidence for acute pulmonary embolus. 2. Bilateral lower lobe hypoventilatory changes including ground-glass attenuation and subsegmental atelectasis. 3. Emphysema and aortic atherosclerosis. Aortic Atherosclerosis (ICD10-I70.0) and Emphysema (ICD10-J43.9). Electronically Signed   By: Kerby Moors M.D.   On: 03/07/2020 12:17   DG Chest Portable 1 View  Result Date: 03/07/2020 CLINICAL DATA:  Shortness of breath. EXAM: PORTABLE CHEST 1 VIEW COMPARISON:  02/10/2020 FINDINGS: There is cardiomegaly. The patient is status post prior median sternotomy. There is no pneumothorax. No  large pleural effusion. No focal infiltrate. There is atelectasis at the lung bases. There is no acute osseous abnormality. IMPRESSION: No active disease. Electronically Signed   By: Constance Holster M.D.   On: 03/07/2020 02:52   CT RENAL STONE STUDY  Result Date: 03/07/2020 CLINICAL DATA:  Flank pain. EXAM: CT ABDOMEN AND PELVIS WITHOUT CONTRAST TECHNIQUE: Multidetector CT imaging of the abdomen and pelvis was performed following the standard protocol without IV contrast. COMPARISON:  CT scan 01/26/2019 FINDINGS: Lower chest: Marked eventration of the right hemidiaphragm with overlying vascular crowding and atelectasis. There is also left basilar scarring changes. The heart is normal in size. No pericardial effusion. Stable aortic and coronary artery calcifications. Hepatobiliary: Numerous stable hepatic cysts. No worrisome hepatic lesions without contrast. There is a stable 2 cm gallstone in the gallbladder. No biliary dilatation. Pancreas: Prominent fatty interstices but no mass, inflammation or ductal dilatation. Spleen: Normal size. No focal lesions. Adrenals/Urinary Tract: Stable small right adrenal gland nodule, likely benign adenoma. The left adrenal gland is normal. Lower pole left renal calculus is stable. No obstructing ureteral calculi or bladder calculi. No worrisome renal or bladder lesions. Stomach/Bowel: The stomach, duodenum, small bowel and colon are unremarkable. No acute inflammatory process, mass lesions or obstructive findings. The terminal ileum is normal. The appendix is normal. Scattered colonic diverticulosis but no findings for acute diverticulitis. 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 Advanced atherosclerotic calcifications involving the aorta and branch vessels. No mesenteric or retroperitoneal mass or adenopathy. Reproductive: Stable enlarged prostate gland. Radiographic  markers from a Urolift procedure are noted. Other: No pelvic mass or adenopathy. No free pelvic fluid collections. No inguinal mass or adenopathy. No abdominal wall hernia or subcutaneous lesions. Musculoskeletal: No significant bony findings. The left hip is chronically fused. IMPRESSION: 1. Stable lower pole left renal calculus but no obstructing ureteral calculi or bladder calculi. 2. No acute abdominal/pelvic findings, mass lesions or adenopathy. 3. Stable numerous hepatic cysts. 4. Cholelithiasis. 5. Stable surgical changes from aortoiliac bypass graft surgery with a thrombosed saccular aneurysm. 6. Stable enlarged prostate gland. 7. Aortic atherosclerosis. Aortic Atherosclerosis (ICD10-I70.0). Electronically Signed   By: Marijo Sanes M.D.   On: 03/07/2020 06:23   VAS [place] LOWER EXTREMITY VENOUS (DVT)  Result Date: 03/07/2020  Lower Venous DVT Study Indications: Pain, and Swelling.  Risk Factors: None identified. Performing Technologist: Griffin Basil RCT RDMS  Examination Guidelines: A complete evaluation includes B-mode imaging, spectral Doppler, color Doppler, and power Doppler as needed of all accessible portions of each vessel. Bilateral testing is considered an integral part of a complete examination. Limited examinations for reoccurring indications may be performed as noted. The reflux portion of the exam is performed with the patient in reverse Trendelenburg.  +---------+---------------+---------+-----------+----------+--------------+ RIGHT    CompressibilityPhasicitySpontaneityPropertiesThrombus Aging +---------+---------------+---------+-----------+----------+--------------+ CFV      Full           Yes  Yes                                 +---------+---------------+---------+-----------+----------+--------------+ SFJ      Full                                                        +---------+---------------+---------+-----------+----------+--------------+ FV Prox  Full                                                         +---------+---------------+---------+-----------+----------+--------------+ FV Mid   Full                                                        +---------+---------------+---------+-----------+----------+--------------+ FV DistalFull                                                        +---------+---------------+---------+-----------+----------+--------------+ PFV      Full                                                        +---------+---------------+---------+-----------+----------+--------------+ POP      Full           Yes      Yes                                 +---------+---------------+---------+-----------+----------+--------------+ PTV      Full                                                        +---------+---------------+---------+-----------+----------+--------------+ PERO     Full                                                        +---------+---------------+---------+-----------+----------+--------------+   +---------+---------------+---------+-----------+----------+--------------+ LEFT     CompressibilityPhasicitySpontaneityPropertiesThrombus Aging +---------+---------------+---------+-----------+----------+--------------+ CFV      Full           Yes      Yes                                 +---------+---------------+---------+-----------+----------+--------------+ SFJ      Full                                                        +---------+---------------+---------+-----------+----------+--------------+  FV Prox  Full                                                        +---------+---------------+---------+-----------+----------+--------------+ FV Mid   Full                                                        +---------+---------------+---------+-----------+----------+--------------+ FV DistalFull                                                         +---------+---------------+---------+-----------+----------+--------------+ PFV      Full                                                        +---------+---------------+---------+-----------+----------+--------------+ POP      Full           Yes      Yes                                 +---------+---------------+---------+-----------+----------+--------------+ PTV      Full                                                        +---------+---------------+---------+-----------+----------+--------------+ PERO     Full                                                        +---------+---------------+---------+-----------+----------+--------------+     Summary: RIGHT: - There is no evidence of deep vein thrombosis in the lower extremity.  - No cystic structure found in the popliteal fossa.  LEFT: - There is no evidence of deep vein thrombosis in the lower extremity.  - No cystic structure found in the popliteal fossa.  *See table(s) above for measurements and observations.    Preliminary         Scheduled Meds: . albuterol  2.5 mg Nebulization Q4H  . budesonide (PULMICORT) nebulizer solution  0.25 mg Nebulization BID  . enoxaparin (LOVENOX) injection  40 mg Subcutaneous Q24H  . insulin aspart  0-9 Units Subcutaneous TID WC  . ipratropium  0.5 mg Nebulization Q4H  . predniSONE  40 mg Oral Q breakfast   Continuous Infusions: . [START ON 03/08/2020] cefTRIAXone (ROCEPHIN)  IV       LOS: 0 days    Time spent: 36 minutes spent on chart review, discussion with nursing staff, consultants, updating family and interview/physical exam; more than  50% of that time was spent in counseling and/or coordination of care.    Dayvian Blixt J [place] DO [facility] Available via Epic secure chat 7am-7pm After these hours, please refer to coverage provider listed on [email or web address] 03/07/2020, 2:13 PM

## 2020-03-07 NOTE — ED Notes (Signed)
Pt breakfast tray was ordered

## 2020-03-07 NOTE — H&P (Signed)
History and Physical    Chris Payne WPY:[id number] DOB: June 07, 1950 DOA: 03/07/2020  PCP: Benito Mccreedy, MD  Patient coming from: Home.  Chief Complaint: Shortness of breath.  HPI: Chris Payne is a 69 y.o. male with history of COPD, CAD status post CABG, abdominal aortic aneurysm status post repair, hypertension, seizure disorder, CHF last EF measured in 2020 was 40% presents to the ER because of shortness of breath.  Patient states he has been having wheezing and shortness of breath productive cough for the last 1 week had gone to his primary care physician has been on prednisone for last 3 to 4 days.  Despite which patient was too short of breath.  Denies chest pain fever chills.  Patient states that last few weeks patient has being suprapubic pain but denies any dysuria discharges or hematuria.  ED Course: In the ER patient is found to be wheezing bilaterally chest x-ray unremarkable EKG shows sinus tachycardia Covid test is pending.  BNP was 40.  Patient was given Solu-Medrol along with nebulizer and admitted for COPD exacerbation.  Review of Systems: As per HPI, rest all negative.   Past Medical History:  Diagnosis Date  . AAA (abdominal aortic aneurysm) ([place])    s/p repair 6/11  . CAD (coronary artery disease)    s/p CABG 2/12:  LIMA to LAD, SVG to diagonal-1, SVG to ramus intermedius,, SVG to AM (Dr. Roxan Hockey) ;  b.  Myoview 10/13:  inf infarct with very mild peri-infarct ischemia, EF 49%, inf HK; c  He has severe three-vessel native coronary artery disease. Catheterization March 2015 as above  . CAP (community acquired pneumonia) 02/23/2014  . Carotid stenosis    a.  dopplers 0/53: LICA 97-67%;  b. Carotid dopplers 3/14:  R 0-39%, L 60-79%, repeat 6 mos  . CHF (congestive heart failure) ([place])   . COPD (chronic obstructive pulmonary disease) ([place])   . GERD (gastroesophageal reflux disease)   . Headache(784.0)   . Hyperlipidemia   . Hypertension   . Hypoxia  02/23/2014  . Inguinal hernia   . Myocardial infarction [facility])    PCI x3  . PAD (peripheral artery disease) ([place])    ABIs 4/12:  R 0.88, L 0.92; R SFA 40%, L CFA 50%, L SFA 50-60%  . Pneumonia    hx  . Stroke [facility])    h/o pontine CVA  . Thyroid nodule    incidental finding on carotid doppler 3/14 => thyroid U/S ordered    Past Surgical History:  Procedure Laterality Date  . ABDOMINAL AORTIC ANEURYSM REPAIR  10-03-2009  . CORONARY ARTERY BYPASS GRAFT  06/24/2010   LIMA to the LAD, SVG to first diagonal, SVG to ramus intermediate, SVG to acute marginal. EF 50%. 2/12  . EXPLORATION POST OPERATIVE OPEN HEART    . EXPLORATORY LAPAROTOMY  09/2009   ligation of lumbar arteries  . EXTERNAL EAR SURGERY Left    laceration child  . HIP SURGERY  40 years ago   left hip bone removal and pinning  . INGUINAL HERNIA REPAIR Left 07/21/2012   Procedure: HERNIA REPAIR INGUINAL ADULT;  Surgeon: Merrie Roof, MD;  Location: [place];  Service: General;  Laterality: Left;  . INSERTION OF MESH Left 07/21/2012   Procedure: INSERTION OF MESH;  Surgeon: Merrie Roof, MD;  Location: [place];  Service: General;  Laterality: Left;  . LEFT HEART CATHETERIZATION WITH CORONARY/GRAFT ANGIOGRAM  06/21/2012   Procedure: LEFT HEART CATHETERIZATION WITH  Beatrix Fetters;  Surgeon: Minus Breeding, MD;  Location: [facility] CATH LAB;  Service: Cardiovascular;;  . LEFT HEART CATHETERIZATION WITH CORONARY/GRAFT ANGIOGRAM N/A 07/04/2013   Procedure: LEFT HEART CATHETERIZATION WITH Beatrix Fetters;  Surgeon: Sinclair Grooms, MD;  Location: [facility] CATH LAB;  Service: Cardiovascular;  Laterality: N/A;  . NM MYOCAR PERF WALL MOTION  12/02/2010   inferoapical defect is fixed c/w prior infarction/scar, there is minimal borderzone ischemia.     reports that he quit smoking about 10 years ago. His smoking use included cigarettes. He has a 30.00 pack-year smoking history. He has never used smokeless tobacco. He reports that he does  not drink alcohol and does not use drugs.  No Known Allergies  Family History  Problem Relation Age of Onset  . Alcohol abuse Father   . Cancer Sister 47       unknown primary  . Diabetes Mother   . Hypertension Mother   . Cancer Sister 28       kidney cancer    Prior to Admission medications   Medication Sig Start Date End Date Taking? Authorizing Provider  albuterol (VENTOLIN HFA) 108 (90 Base) MCG/ACT inhaler Inhale 2 puffs into the lungs every 6 (six) hours as needed for wheezing or shortness of breath. 11/09/19  Yes Carmin Muskrat, MD  aspirin EC 81 MG EC tablet Take 1 tablet (81 mg total) by mouth daily. Patient not taking: Reported on 03/07/2020 05/24/18   Geradine Girt, DO  atorvastatin (LIPITOR) 80 MG tablet Take by mouth.    [provider]  budesonide-formoterol (SYMBICORT) 80-4.5 MCG/ACT inhaler Inhale 2 puffs into the lungs 2 (two) times daily. 11/09/19   Carmin Muskrat, MD  clotrimazole (LOTRIMIN) 1 % cream Apply to affected area 2 times daily 10/06/19   Rodell Perna A, PA-C  furosemide (LASIX) 20 MG tablet Take 1 tablet (20 mg total) by mouth daily. 02/10/20   Deno Etienne, DO  gabapentin (NEURONTIN) 300 MG capsule Take 300 mg by mouth 2 (two) times daily. 05/14/18   [provider]  metFORMIN (GLUCOPHAGE) 500 MG tablet Take 0.5 tablets (250 mg total) by mouth 2 (two) times daily. 02/10/20   Deno Etienne, DO  nystatin (MYCOSTATIN/NYSTOP) powder Apply topically 3 (three) times daily. 10/19/18   Patwardhan, Reynold Bowen, MD  predniSONE (DELTASONE) 20 MG tablet 2 tabs po daily x 4 days 02/10/20   Deno Etienne, DO  rivaroxaban (XARELTO) 2.5 MG TABS tablet Take 1 tablet (2.5 mg total) by mouth 2 (two) times daily. 01/31/19   Miquel Dunn, NP  rosuvastatin (CRESTOR) 40 MG tablet Take 40 mg by mouth daily.    [provider]  sacubitril-valsartan (ENTRESTO) 24-26 MG Take 1 tablet by mouth every other day. 10/26/18   Miquel Dunn, NP    Physical  Exam: Constitutional: Moderately built and nourished. Vitals:   03/07/20 0230 03/07/20 0321 03/07/20 0400 03/07/20 0500  BP: 129/84 121/73 115/69 122/86  Pulse: (!) 105 (!) 107 (!) 103 (!) 105  Resp: 13 20 18  (!) 21  Temp:  98.7 F (37.1 C)    TempSrc:  Temporal    SpO2: 94% 95% 93% 91%  Weight:      Height:       Eyes: Anicteric no pallor. ENMT: No discharge from the ears eyes nose or mouth. Neck: No JVD appreciated no mass felt. Respiratory: Bilateral expiratory wheeze and no crepitations. Cardiovascular: S1 and S2 heard. Abdomen: Soft nontender bowel sounds present. Musculoskeletal: No edema.  Skin: No rash. Neurologic: Alert awake oriented to time place and person.  Moves all extremities. Psychiatric: Appears normal.  Normal affect.   Labs on Admission: I have personally reviewed following labs and imaging studies  CBC: Recent Labs  Lab 03/07/20 0233  WBC 7.0  NEUTROABS 3.0  HGB 14.9  HCT 48.0  MCV 106.9*  PLT 300   Basic Metabolic Panel: Recent Labs  Lab 03/07/20 0233  NA 142  K 3.6  CL 100  CO2 30  GLUCOSE 140*  BUN 16  CREATININE 1.22  CALCIUM 9.5   GFR: Estimated Creatinine Clearance: 52.7 mL/min (by C-G formula based on SCr of 1.22 mg/dL). Liver Function Tests: No results for input(s): AST, ALT, ALKPHOS, BILITOT, PROT, ALBUMIN in the last 168 hours. No results for input(s): LIPASE, AMYLASE in the last 168 hours. No results for input(s): AMMONIA in the last 168 hours. Coagulation Profile: No results for input(s): INR, PROTIME in the last 168 hours. Cardiac Enzymes: No results for input(s): CKTOTAL, CKMB, CKMBINDEX, TROPONINI in the last 168 hours. BNP (last 3 results) No results for input(s): PROBNP in the last 8760 hours. HbA1C: No results for input(s): HGBA1C in the last 72 hours. CBG: No results for input(s): GLUCAP in the last 168 hours. Lipid Profile: No results for input(s): CHOL, HDL, LDLCALC, TRIG, CHOLHDL, LDLDIRECT in the last 72  hours. Thyroid Function Tests: No results for input(s): TSH, T4TOTAL, FREET4, T3FREE, THYROIDAB in the last 72 hours. Anemia Panel: No results for input(s): VITAMINB12, FOLATE, FERRITIN, TIBC, IRON, RETICCTPCT in the last 72 hours. Urine analysis:    Component Value Date/Time   COLORURINE YELLOW 03/07/2020 0512   APPEARANCEUR CLEAR 03/07/2020 0512   LABSPEC 1.010 03/07/2020 0512   PHURINE 7.0 03/07/2020 0512   GLUCOSEU NEGATIVE 03/07/2020 0512   HGBUR NEGATIVE 03/07/2020 0512   BILIRUBINUR NEGATIVE 03/07/2020 0512   BILIRUBINUR small 08/23/2013 1736   KETONESUR NEGATIVE 03/07/2020 0512   PROTEINUR NEGATIVE 03/07/2020 0512   UROBILINOGEN 1.0 02/08/2015 1902   NITRITE NEGATIVE 03/07/2020 0512   LEUKOCYTESUR SMALL (A) 03/07/2020 0512   Sepsis Labs: @LABRCNTIP (procalcitonin:4,lacticidven:4) )No results found for this or any previous visit (from the past 240 hour(s)).   Radiological Exams on Admission: DG Chest Portable 1 View  Result Date: 03/07/2020 CLINICAL DATA:  Shortness of breath. EXAM: PORTABLE CHEST 1 VIEW COMPARISON:  02/10/2020 FINDINGS: There is cardiomegaly. The patient is status post prior median sternotomy. There is no pneumothorax. No large pleural effusion. No focal infiltrate. There is atelectasis at the lung bases. There is no acute osseous abnormality. IMPRESSION: No active disease. Electronically Signed   By: Constance Holster M.D.   On: 03/07/2020 02:52    EKG: Independently reviewed.  Sinus tachycardia.  Assessment/Plan Principal Problem:   COPD exacerbation ([facility]) Active Problems:   S/P AAA repair   History of pontine CVA   PAD (peripheral artery disease) ([facility])   Seizure disorder ([place])    1. COPD exacerbation for which patient is on steroids nebulizer and Pulmicort.  Also on antibiotics.  Check D-dimer. 2. Suprapubic pain with UA concerning for UTI.  CT renal studies pending.  I have placed patient on ceftriaxone will check urine cultures. 3. History  of chronic systolic heart failure appears compensated.  Last EF measured was 40%. 4. History of hypertension Home medications are not known yet.  I have placed patient on as needed IV labetalol. 5. History of CAD status post CABG and abdominal attic aneurysm repair.  Patient states he has  not taken aspirin because it was causing bruises. 6. History of seizures per the chart. 7. Per the chart patient used to be on Xarelto previously patient does not recall why he was he on and he states he does not take it anymore.  Will check D-dimer.  Note that patient's home medications are not confirmed yet.  Will need to confirm home medications in order appropriately.   DVT prophylaxis: Lovenox. Code Status: Full code. Family Communication: Discussed with patient. Disposition Plan: Home. Consults called: None. Admission status: Observation.   Rise Patience MD [facility] Pager [phone].  If 7PM-7AM, please contact night-coverage [email or web address] Password [facility]  03/07/2020, 6:21 AM

## 2020-03-07 NOTE — ED Notes (Signed)
Called patient son Toula Moos and update patient status that he would be staying in the hospital

## 2020-03-07 NOTE — ED Triage Notes (Addendum)
BIB [facility] d/t worsening SOB. Pt states recently seeking care at [place]. Pt states running out of inhaler several days ago. Given 125 soul med and 2 duo neb tx. Pt alert able to speak in full sentences. Transitioned to room air. 92%. HX COPD, smoking.

## 2020-03-07 NOTE — ED Notes (Signed)
88% while ambulating on RA

## 2020-03-07 NOTE — Progress Notes (Signed)
Bilateral Lower Ext. study completed.   See CVProc for preliminary results.   Matie Dimaano, RDMS, RVT 

## 2020-03-07 NOTE — ED Notes (Signed)
Lunch Tray Ordered @ 1034. 

## 2020-03-07 NOTE — ED Notes (Signed)
Spoke with son update given.

## 2020-03-08 DIAGNOSIS — L89150 Pressure ulcer of sacral region, unstageable: Secondary | ICD-10-CM | POA: Diagnosis present

## 2020-03-08 DIAGNOSIS — J441 Chronic obstructive pulmonary disease with (acute) exacerbation: Secondary | ICD-10-CM | POA: Diagnosis not present

## 2020-03-08 LAB — BASIC METABOLIC PANEL
Anion gap: 8 (ref 5–15)
BUN: 13 mg/dL (ref 8–23)
CO2: 27 mmol/L (ref 22–32)
Calcium: 9.2 mg/dL (ref 8.9–10.3)
Chloride: 105 mmol/L (ref 98–111)
Creatinine, Ser: 0.94 mg/dL (ref 0.61–1.24)
GFR, Estimated: >60 mL/min (ref >60)
Glucose, Bld: 185 mg/dL — ABNORMAL HIGH (ref 70–99)
Potassium: 3.7 mmol/L (ref 3.5–5.1)
Sodium: 140 mmol/L (ref 135–145)

## 2020-03-08 LAB — URINE CULTURE: Culture: NO GROWTH

## 2020-03-08 LAB — MAGNESIUM: Magnesium: 2.4 mg/dL (ref 1.7–2.4)

## 2020-03-08 LAB — GLUCOSE, CAPILLARY
Glucose-Capillary: 108 mg/dL — ABNORMAL HIGH (ref 70–99)
Glucose-Capillary: 188 mg/dL — ABNORMAL HIGH (ref 70–99)

## 2020-03-08 MED ORDER — ENTRESTO 24-26 MG PO TABS
1.0000 | ORAL_TABLET | ORAL | 0 refills | Status: DC
Start: 2020-03-08 — End: 2021-03-13

## 2020-03-08 MED ORDER — BUDESONIDE-FORMOTEROL FUMARATE 160-4.5 MCG/ACT IN AERO
2.0000 | INHALATION_SPRAY | Freq: Two times a day (BID) | RESPIRATORY_TRACT | 2 refills | Status: DC
Start: 2020-03-08 — End: 2021-05-19

## 2020-03-08 MED ORDER — ROSUVASTATIN CALCIUM 40 MG PO TABS: 40.0000 mg | ORAL_TABLET | Freq: Every day | ORAL | 0 refills | Status: DC

## 2020-03-08 MED ORDER — METOPROLOL TARTRATE 25 MG PO TABS: 25.0000 mg | ORAL_TABLET | Freq: Two times a day (BID) | ORAL | 0 refills | Status: DC

## 2020-03-08 MED ORDER — PREDNISONE 20 MG PO TABS: 40.0000 mg | ORAL_TABLET | Freq: Every day | ORAL | 0 refills | Status: AC

## 2020-03-08 MED ORDER — METFORMIN HCL 500 MG PO TABS: 250.0000 mg | ORAL_TABLET | Freq: Two times a day (BID) | ORAL | 0 refills | Status: DC

## 2020-03-08 MED ORDER — SPIRIVA HANDIHALER 18 MCG IN CAPS: 18.0000 ug | ORAL_CAPSULE | Freq: Every day | RESPIRATORY_TRACT | 0 refills | Status: DC

## 2020-03-08 MED ORDER — DOXYCYCLINE HYCLATE 50 MG PO CAPS: 100.0000 mg | ORAL_CAPSULE | Freq: Two times a day (BID) | ORAL | 0 refills | Status: AC

## 2020-03-08 MED ORDER — ALBUTEROL SULFATE HFA 108 (90 BASE) MCG/ACT IN AERS: 2.0000 | INHALATION_SPRAY | Freq: Four times a day (QID) | RESPIRATORY_TRACT | 0 refills | Status: DC | PRN

## 2020-03-08 NOTE — Plan of Care (Signed)
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
## 2020-03-08 NOTE — Plan of Care (Signed)
Problem: Education: Goal: Knowledge of General Education information will improve Description: Including pain rating scale, medication(s)/side effects and non-pharmacologic comfort measures 03/08/2020 1318 by Durwin Glaze, LPN Outcome: Adequate for Discharge 03/08/2020 1021 by Durwin Glaze, LPN Outcome: Progressing   Problem: Health Behavior/Discharge Planning: Goal: Ability to manage health-related needs will improve 03/08/2020 1318 by Durwin Glaze, LPN Outcome: Adequate for Discharge 03/08/2020 1021 by Durwin Glaze, LPN Outcome: Progressing   Problem: Clinical Measurements: Goal: Ability to maintain clinical measurements within normal limits will improve 03/08/2020 1318 by Durwin Glaze, LPN Outcome: Adequate for Discharge 03/08/2020 1021 by Durwin Glaze, LPN Outcome: Progressing Goal: Will remain free from infection 03/08/2020 1318 by Durwin Glaze, LPN Outcome: Adequate for Discharge 03/08/2020 1021 by Durwin Glaze, LPN Outcome: Progressing Goal: Diagnostic test results will improve 03/08/2020 1318 by Durwin Glaze, LPN Outcome: Adequate for Discharge 03/08/2020 1021 by Durwin Glaze, LPN Outcome: Progressing Goal: Respiratory complications will improve 03/08/2020 1318 by Durwin Glaze, LPN Outcome: Adequate for Discharge 03/08/2020 1021 by Durwin Glaze, LPN Outcome: Progressing Goal: Cardiovascular complication will be avoided 03/08/2020 1318 by Durwin Glaze, LPN Outcome: Adequate for Discharge 03/08/2020 1021 by Durwin Glaze, LPN Outcome: Progressing   Problem: Activity: Goal: Risk for activity intolerance will decrease 03/08/2020 1318 by Durwin Glaze, LPN Outcome: Adequate for Discharge 03/08/2020 1021 by Durwin Glaze, LPN Outcome: Progressing   Problem: Nutrition: Goal: Adequate nutrition will be maintained 03/08/2020 1318 by Durwin Glaze, LPN Outcome: Adequate for Discharge 03/08/2020 1021  by Durwin Glaze, LPN Outcome: Progressing   Problem: Coping: Goal: Level of anxiety will decrease 03/08/2020 1318 by Durwin Glaze, LPN Outcome: Adequate for Discharge 03/08/2020 1021 by Durwin Glaze, LPN Outcome: Progressing   Problem: Elimination: Goal: Will not experience complications related to bowel motility 03/08/2020 1318 by Durwin Glaze, LPN Outcome: Adequate for Discharge 03/08/2020 1021 by Durwin Glaze, LPN Outcome: Progressing Goal: Will not experience complications related to urinary retention 03/08/2020 1318 by Durwin Glaze, LPN Outcome: Adequate for Discharge 03/08/2020 1021 by Durwin Glaze, LPN Outcome: Progressing   Problem: Pain Managment: Goal: General experience of comfort will improve 03/08/2020 1318 by Durwin Glaze, LPN Outcome: Adequate for Discharge 03/08/2020 1021 by Durwin Glaze, LPN Outcome: Progressing   Problem: Safety: Goal: Ability to remain free from injury will improve 03/08/2020 1318 by Durwin Glaze, LPN Outcome: Adequate for Discharge 03/08/2020 1021 by Durwin Glaze, LPN Outcome: Progressing   Problem: Skin Integrity: Goal: Risk for impaired skin integrity will decrease 03/08/2020 1318 by Durwin Glaze, LPN Outcome: Adequate for Discharge 03/08/2020 1021 by Durwin Glaze, LPN Outcome: Progressing   Problem: Education: Goal: Knowledge of disease or condition will improve 03/08/2020 1318 by Durwin Glaze, LPN Outcome: Adequate for Discharge 03/08/2020 1021 by Durwin Glaze, LPN Outcome: Progressing Goal: Knowledge of the prescribed therapeutic regimen will improve 03/08/2020 1318 by Durwin Glaze, LPN Outcome: Adequate for Discharge 03/08/2020 1021 by Durwin Glaze, LPN Outcome: Progressing Goal: Individualized Educational Video(s) 03/08/2020 1318 by Durwin Glaze, LPN Outcome: Adequate for Discharge 03/08/2020 1021 by Durwin Glaze, LPN Outcome: Progressing    Problem: Activity: Goal: Ability to tolerate increased activity will improve 03/08/2020 1318 by Durwin Glaze, LPN Outcome: Adequate for Discharge 03/08/2020 1021 by Durwin Glaze, LPN Outcome: Progressing Goal: Will verbalize the importance of balancing activity with adequate rest periods 03/08/2020 1318 by Hall Busing,  Earley Favor, LPN Outcome: Adequate for Discharge 03/08/2020 1021 by Durwin Glaze, LPN Outcome: Progressing   Problem: Respiratory: Goal: Ability to maintain a clear airway will improve 03/08/2020 1318 by Durwin Glaze, LPN Outcome: Adequate for Discharge 03/08/2020 1021 by Durwin Glaze, LPN Outcome: Progressing Goal: Levels of oxygenation will improve 03/08/2020 1318 by Durwin Glaze, LPN Outcome: Adequate for Discharge 03/08/2020 1021 by Durwin Glaze, LPN Outcome: Progressing Goal: Ability to maintain adequate ventilation will improve 03/08/2020 1318 by Durwin Glaze, LPN Outcome: Adequate for Discharge 03/08/2020 1021 by Durwin Glaze, LPN Outcome: Progressing

## 2020-03-08 NOTE — Discharge Summary (Signed)
Physician Discharge Summary  AIDDEN MARKOVIC SNK:[id number] DOB: 08/21/1950 DOA: 03/07/2020  PCP: Benito Mccreedy, MD  Admit date: 03/07/2020 Discharge date: 03/08/2020  Admitted From: Home  Disposition: Home  Recommendations for Outpatient Follow-up:  1. Follow up with PCP in 1-2 weeks 2. Continue doxycycline 100 mg p.o. twice daily to complete 7-day course 3. Continue prednisone 40 mg p.o. daily x5 days 4. Increased Symbicort 160-4.5 2 puffs twice daily 5. Started on [employer]: Declined home health services Equipment/Devices: Rollator  Discharge Condition: Stable CODE STATUS: Full code Diet recommendation: Heart healthy/consistent carbohydrate diet  History of present illness:  WILFRIDO LUEDKE is a 69 year old male with past medical history significant for COPD, CAD s/p CABG, abdominal aortic aneurysm s/p repair, essential hypertension, seizure disorder, chronic systolic congestive heart failure, who presented to the ED with progressive shortness of breath.  Onset roughly 1 week ago with associated wheezing and productive cough.  Recently seen by PCP, started on prednisone over the last 3-4 days with no significant improvement.  In the ED, temperature 97.6, HR 105, RR 21, BP 129/84. Patient was noted to be significantly dyspneic with increased work of breathing, wheezing bilaterally.  Chest x-ray with no acute cardiopulmonary disease process.  BNP 40.  Patient was started on IV Solu-Medrol, neb treatments and admitted for COPD exacerbation.  TRH consulted for further evaluation and admission.  Hospital course:  COPD exacerbation Patient presenting with 1 week history of progressive shortness of breath associated with productive cough and wheezing.  Recently seen by PCP and started on prednisone without any appreciable improvement.  Home regimen includes Symbicort, albuterol MDI as needed.  He is not oxygen dependent.  Chest x-ray on admission with no acute  cardiopulmonary disease process, but did note significant wheezing, increased respiratory effort and conversational dyspnea.  Patient was started on prednisone, albuterol/Atrovent nebs with Pulmicort twice daily.  His symptoms quickly improved and he was able to maintain adequate oxygenation on room air even with exertion.  Will discharge home on doxycycline 100 mg p.o. twice daily to complete 7-day course, prednisone 40 mg p.o. daily to complete 5-day course.  Home Symbicort increased to 160-4.52 puffs twice daily and started on Spiriva.  Outpatient follow-up with PCP.  Elevated D-dimer D-dimer elevated at 4.83.  Noted bilateral lower extremity edema.  Bilateral duplex venous ultrasound lower extremities negative for DVT.  CT angiogram chest negative for pulmonary embolism.  Essential hypertension CAD s/p CABG 2012 Chronic combined systolic diastolic congestive heart failure, compensated BNP 40.5 with chest x-ray with no acute cardiopulmonary disease findings.  Last TTE 09/22/2018 with LVEF 40%, mild concentric LVH, mild global hypokinesis, grade 1 diastolic dysfunction, AR.  Follows with [facility] cardiovascular outpatient.  Continue metoprolol, Entresto 24-26 mg p.o. every other day, Crestor 40 mg p.o. daily, Furosemide 20 mg p.o. daily  Type 2 diabetes mellitus Hemoglobin A1c 6.0, well controlled.  On Metformin 250mg  PO BID.  Aortic root dialation 44 mm by TTE.  Continue follow-up with cardiology.  Peripheral artery disease Continue aspirin 81 mg p.o. daily, Xarelto 2.5 mg p.o. twice daily  Weakness/deconditioning/gait disturbance: Utilizes a walker at baseline.  Lives with girlfriend.  Requesting Rollator.  Discharge Diagnoses:  Principal Problem:   COPD exacerbation ([place]) Active Problems:   S/P AAA repair   History of pontine CVA   PAD (peripheral artery disease) ([facility])   Seizure disorder ([place])   Pressure injury of skin    Discharge Instructions  Discharge Instructions     Call  MD for:  difficulty breathing, headache or visual disturbances   Complete by: As directed    Call MD for:  extreme fatigue   Complete by: As directed    Call MD for:  persistant dizziness or light-headedness   Complete by: As directed    Call MD for:  persistant nausea and vomiting   Complete by: As directed    Call MD for:  severe uncontrolled pain   Complete by: As directed    Call MD for:  temperature >100.4   Complete by: As directed    Diet - low sodium heart healthy   Complete by: As directed    Increase activity slowly   Complete by: As directed    No wound care   Complete by: As directed      Allergies as of 03/08/2020   No Known Allergies     Medication List    STOP taking these medications   budesonide-formoterol 80-4.5 MCG/ACT inhaler Commonly known as: SYMBICORT Replaced by: budesonide-formoterol 160-4.5 MCG/ACT inhaler   Xarelto 2.5 MG Tabs tablet Generic drug: rivaroxaban     TAKE these medications   albuterol 108 (90 Base) MCG/ACT inhaler Commonly known as: VENTOLIN HFA Inhale 2 puffs into the lungs every 6 (six) hours as needed for wheezing or shortness of breath.   aspirin 81 MG EC tablet Take 1 tablet (81 mg total) by mouth daily.   budesonide-formoterol 160-4.5 MCG/ACT inhaler Commonly known as: Symbicort Inhale 2 puffs into the lungs 2 (two) times daily. Replaces: budesonide-formoterol 80-4.5 MCG/ACT inhaler   doxycycline 50 MG capsule Commonly known as: VIBRAMYCIN Take 2 capsules (100 mg total) by mouth 2 (two) times daily for 6 days.   Entresto 24-26 MG Generic drug: sacubitril-valsartan Take 1 tablet by mouth every other day.   furosemide 20 MG tablet Commonly known as: LASIX Take 1 tablet (20 mg total) by mouth daily.   gabapentin 300 MG capsule Commonly known as: NEURONTIN Take 300 mg by mouth 2 (two) times daily.   metFORMIN 500 MG tablet Commonly known as: GLUCOPHAGE Take 0.5 tablets (250 mg total) by mouth 2 (two)  times daily.   metoprolol tartrate 25 MG tablet Commonly known as: LOPRESSOR Take 1 tablet (25 mg total) by mouth 2 (two) times daily.   nystatin powder Commonly known as: MYCOSTATIN/NYSTOP Apply topically 3 (three) times daily. What changed: how much to take   predniSONE 20 MG tablet Commonly known as: DELTASONE Take 2 tablets (40 mg total) by mouth daily with breakfast for 4 days. Start taking on: March 09, 2020 What changed:   how much to take  how to take this  when to take this  additional instructions   rosuvastatin 40 MG tablet Commonly known as: CRESTOR Take 1 tablet (40 mg total) by mouth daily.   Spiriva HandiHaler 18 MCG inhalation capsule Generic drug: tiotropium Place 1 capsule (18 mcg total) into inhaler and inhale daily.   tamsulosin 0.4 MG Caps capsule Commonly known as: FLOMAX Take 0.4 mg by mouth daily.   triamcinolone cream 0.1 % Commonly known as: KENALOG Apply 1 application topically in the morning and at bedtime.            Durable Medical Equipment  (From admission, onward)         Start     Ordered   03/08/20 0905  For home use only DME 4 wheeled rolling walker with seat  Once       Question:  Patient needs  a walker to treat with the following condition  Answer:  Gait abnormality   03/08/20 5520          Follow-up Information    Osei-Bonsu, Iona Beard, MD. Schedule an appointment as soon as possible for a visit in 1 week(s).   Specialty: Internal Medicine Contact information: [address] [place] [address] [phone]              No Known Allergies  Consultations:  None   Procedures/Studies: CT ANGIO CHEST PE W OR WO CONTRAST  Result Date: 03/07/2020 CLINICAL DATA:  Evaluate for pulmonary embolus. Low to intermediate probability. Positive D-dimer. EXAM: CT ANGIOGRAPHY CHEST WITH CONTRAST TECHNIQUE: Multidetector CT imaging of the chest was performed using the standard protocol during bolus  administration of intravenous contrast. Multiplanar CT image reconstructions and MIPs were obtained to evaluate the vascular anatomy. CONTRAST:  53mL OMNIPAQUE IOHEXOL 350 MG/ML SOLN COMPARISON:  None. FINDINGS: Cardiovascular: The main pulmonary artery appears patent. No lobar or segmental pulmonary artery filling defects identified The heart size is normal. Aortic atherosclerotic disease identified. Previous CABG. No pericardial effusion identified. Mediastinum/Nodes: Normal appearance of the thyroid gland. The trachea appears patent and is midline. Normal appearance of the esophagus. No enlarged supraclavicular, axillary, mediastinal, or hilar lymph nodes. Lungs/Pleura: No pleural effusion. Centrilobular and paraseptal emphysema. No airspace consolidation, or pneumothorax. Dependent changes with subsegmental atelectasis and ground-glass attenuation noted within the lung bases. Upper Abdomen: Multiple liver cysts are identified. No acute findings within the imaged portions of the upper abdomen. Musculoskeletal: No chest wall abnormality. No acute or significant osseous findings. Review of the MIP images confirms the above findings. IMPRESSION: 1. No evidence for acute pulmonary embolus. 2. Bilateral lower lobe hypoventilatory changes including ground-glass attenuation and subsegmental atelectasis. 3. Emphysema and aortic atherosclerosis. Aortic Atherosclerosis (ICD10-I70.0) and Emphysema (ICD10-J43.9). Electronically Signed   By: Kerby Moors M.D.   On: 03/07/2020 12:17   DG Chest Portable 1 View  Result Date: 03/07/2020 CLINICAL DATA:  Shortness of breath. EXAM: PORTABLE CHEST 1 VIEW COMPARISON:  02/10/2020 FINDINGS: There is cardiomegaly. The patient is status post prior median sternotomy. There is no pneumothorax. No large pleural effusion. No focal infiltrate. There is atelectasis at the lung bases. There is no acute osseous abnormality. IMPRESSION: No active disease. Electronically Signed   By:  Constance Holster M.D.   On: 03/07/2020 02:52   DG Chest Port 1 View  Result Date: 02/10/2020 CLINICAL DATA:  Cough, shortness of breath EXAM: PORTABLE CHEST 1 VIEW COMPARISON:  11/09/2019 FINDINGS: Prior CABG. Heart is normal size. No confluent opacities or effusions. No acute bony abnormality. IMPRESSION: No active disease. Electronically Signed   By: Rolm Baptise M.D.   On: 02/10/2020 21:29   CT RENAL STONE STUDY  Result Date: 03/07/2020 CLINICAL DATA:  Flank pain. EXAM: CT ABDOMEN AND PELVIS WITHOUT CONTRAST TECHNIQUE: Multidetector CT imaging of the abdomen and pelvis was performed following the standard protocol without IV contrast. COMPARISON:  CT scan 01/26/2019 FINDINGS: Lower chest: Marked eventration of the right hemidiaphragm with overlying vascular crowding and atelectasis. There is also left basilar scarring changes. The heart is normal in size. No pericardial effusion. Stable aortic and coronary artery calcifications. Hepatobiliary: Numerous stable hepatic cysts. No worrisome hepatic lesions without contrast. There is a stable 2 cm gallstone in the gallbladder. No biliary dilatation. Pancreas: Prominent fatty interstices but no mass, inflammation or ductal dilatation. Spleen: Normal size. No focal lesions. Adrenals/Urinary Tract: Stable small right adrenal  gland nodule, likely benign adenoma. The left adrenal gland is normal. Lower pole left renal calculus is stable. No obstructing ureteral calculi or bladder calculi. No worrisome renal or bladder lesions. Stomach/Bowel: The stomach, duodenum, small bowel and colon are unremarkable. No acute inflammatory process, mass lesions or obstructive findings. The terminal ileum is normal. The appendix is normal. Scattered colonic diverticulosis but no findings for acute diverticulitis. 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 Advanced  atherosclerotic calcifications involving the aorta and branch vessels. No mesenteric or retroperitoneal mass or adenopathy. Reproductive: Stable enlarged prostate gland. Radiographic markers from a Urolift procedure are noted. Other: No pelvic mass or adenopathy. No free pelvic fluid collections. No inguinal mass or adenopathy. No abdominal wall hernia or subcutaneous lesions. Musculoskeletal: No significant bony findings. The left hip is chronically fused. IMPRESSION: 1. Stable lower pole left renal calculus but no obstructing ureteral calculi or bladder calculi. 2. No acute abdominal/pelvic findings, mass lesions or adenopathy. 3. Stable numerous hepatic cysts. 4. Cholelithiasis. 5. Stable surgical changes from aortoiliac bypass graft surgery with a thrombosed saccular aneurysm. 6. Stable enlarged prostate gland. 7. Aortic atherosclerosis. Aortic Atherosclerosis (ICD10-I70.0). Electronically Signed   By: Marijo Sanes M.D.   On: 03/07/2020 06:23   VAS [place] LOWER EXTREMITY VENOUS (DVT)  Result Date: 03/07/2020  Lower Venous DVT Study Indications: Pain, and Swelling.  Risk Factors: None identified. Performing Technologist: Griffin Basil RCT RDMS  Examination Guidelines: A complete evaluation includes B-mode imaging, spectral Doppler, color Doppler, and power Doppler as needed of all accessible portions of each vessel. Bilateral testing is considered an integral part of a complete examination. Limited examinations for reoccurring indications may be performed as noted. The reflux portion of the exam is performed with the patient in reverse Trendelenburg.  +---------+---------------+---------+-----------+----------+--------------+ RIGHT    CompressibilityPhasicitySpontaneityPropertiesThrombus Aging +---------+---------------+---------+-----------+----------+--------------+ CFV      Full           Yes      Yes                                  +---------+---------------+---------+-----------+----------+--------------+ SFJ      Full                                                        +---------+---------------+---------+-----------+----------+--------------+ FV Prox  Full                                                        +---------+---------------+---------+-----------+----------+--------------+ FV Mid   Full                                                        +---------+---------------+---------+-----------+----------+--------------+ FV DistalFull                                                        +---------+---------------+---------+-----------+----------+--------------+  PFV      Full                                                        +---------+---------------+---------+-----------+----------+--------------+ POP      Full           Yes      Yes                                 +---------+---------------+---------+-----------+----------+--------------+ PTV      Full                                                        +---------+---------------+---------+-----------+----------+--------------+ PERO     Full                                                        +---------+---------------+---------+-----------+----------+--------------+   +---------+---------------+---------+-----------+----------+--------------+ LEFT     CompressibilityPhasicitySpontaneityPropertiesThrombus Aging +---------+---------------+---------+-----------+----------+--------------+ CFV      Full           Yes      Yes                                 +---------+---------------+---------+-----------+----------+--------------+ SFJ      Full                                                        +---------+---------------+---------+-----------+----------+--------------+ FV Prox  Full                                                         +---------+---------------+---------+-----------+----------+--------------+ FV Mid   Full                                                        +---------+---------------+---------+-----------+----------+--------------+ FV DistalFull                                                        +---------+---------------+---------+-----------+----------+--------------+ PFV      Full                                                        +---------+---------------+---------+-----------+----------+--------------+  POP      Full           Yes      Yes                                 +---------+---------------+---------+-----------+----------+--------------+ PTV      Full                                                        +---------+---------------+---------+-----------+----------+--------------+ PERO     Full                                                        +---------+---------------+---------+-----------+----------+--------------+     Summary: RIGHT: - There is no evidence of deep vein thrombosis in the lower extremity.  - No cystic structure found in the popliteal fossa.  LEFT: - There is no evidence of deep vein thrombosis in the lower extremity.  - No cystic structure found in the popliteal fossa.  *See table(s) above for measurements and observations. Electronically signed by Monica Martinez MD on 03/07/2020 at 4:17:45 PM.    Final       Subjective: Patient seen and examined at bedside, resting comfortably in bedside chair eating breakfast.  Breathing back to his normal baseline.  No complaints or concerns at this time.  Ready for discharge home.  Denies headache, no dizziness, no chest pain, no palpitations, no shortness of breath, no abdominal pain, no weakness, no fatigue, no paresthesias.  No acute events overnight per nursing staff.  Discharge Exam: Vitals:   03/08/20 0316 03/08/20 0526  BP:  104/64  Pulse:  80  Resp:  20  Temp:  98.2 F (36.8 C)   SpO2: 95% 94%   Vitals:   03/07/20 2056 03/07/20 2358 03/08/20 0316 03/08/20 0526  BP:    104/64  Pulse:    80  Resp:    20  Temp:    98.2 F (36.8 C)  TempSrc:    Oral  SpO2: 99% 96% 95% 94%  Weight:      Height:        General: Pt is alert, awake, not in acute distress Cardiovascular: RRR, S1/S2 +, no rubs, no gallops Respiratory: CTA bilaterally, no wheezing, no rhonchi Abdominal: Soft, NT, ND, bowel sounds + Extremities: no edema, no cyanosis    The results of significant diagnostics from this hospitalization (including imaging, microbiology, ancillary and laboratory) are listed below for reference.     Microbiology: Recent Results (from the past 240 hour(s))  Respiratory Panel by RT PCR (Flu A&B, Covid) - Nasopharyngeal Swab     Status: None   Collection Time: 03/07/20  5:32 AM   Specimen: Nasopharyngeal Swab  Result Value Ref Range Status   SARS Coronavirus 2 by RT PCR NEGATIVE NEGATIVE Final    Comment: (NOTE) SARS-CoV-2 target nucleic acids are NOT DETECTED.  The SARS-CoV-2 RNA is generally detectable in upper respiratoy specimens during the acute phase of infection. The lowest concentration of SARS-CoV-2 viral copies this assay can detect is 131 copies/mL. A negative result does not preclude SARS-Cov-2 infection  and should not be used as the sole basis for treatment or other patient management decisions. A negative result may occur with  improper specimen collection/handling, submission of specimen other than nasopharyngeal swab, presence of viral mutation(s) within the areas targeted by this assay, and inadequate number of viral copies (<131 copies/mL). A negative result must be combined with clinical observations, patient history, and epidemiological information. The expected result is Negative.  Fact Sheet for Patients:  PinkCheek.be  Fact Sheet for Healthcare Providers:  GravelBags.it  This  test is no t yet approved or cleared by the [place] FDA and  has been authorized for detection and/or diagnosis of SARS-CoV-2 by FDA under an Emergency Use Authorization (EUA). This EUA will remain  in effect (meaning this test can be used) for the duration of the COVID-19 declaration under Section 564(b)(1) of the Act, 21 U.S.C. section 360bbb-3(b)(1), unless the authorization is terminated or revoked sooner.     Influenza A by PCR NEGATIVE NEGATIVE Final   Influenza B by PCR NEGATIVE NEGATIVE Final    Comment: (NOTE) The Xpert Xpress SARS-CoV-2/FLU/RSV assay is intended as an aid in  the diagnosis of influenza from Nasopharyngeal swab specimens and  should not be used as a sole basis for treatment. Nasal washings and  aspirates are unacceptable for Xpert Xpress SARS-CoV-2/FLU/RSV  testing.  Fact Sheet for Patients: PinkCheek.be  Fact Sheet for Healthcare Providers: GravelBags.it  This test is not yet approved or cleared by the [place] FDA and  has been authorized for detection and/or diagnosis of SARS-CoV-2 by  FDA under an Emergency Use Authorization (EUA). This EUA will remain  in effect (meaning this test can be used) for the duration of the  Covid-19 declaration under Section 564(b)(1) of the Act, 21  U.S.C. section 360bbb-3(b)(1), unless the authorization is  terminated or revoked. Performed at [facility], [place] [address] [place], [place] [address]   Culture, Urine     Status: None   Collection Time: 03/07/20  7:05 AM   Specimen: Urine, Random  Result Value Ref Range Status   Specimen Description URINE, RANDOM  Final   Special Requests NONE  Final   Culture   Final    NO GROWTH Performed at [facility], [place] [address] [place], [place] [address]    Report Status 03/08/2020 FINAL  Final     Labs: BNP (last 3 results) Recent Labs    11/09/19 1859 02/10/20 2000 03/07/20 0233   BNP 39.9 19.0 93.8   Basic Metabolic Panel: Recent Labs  Lab 03/07/20 0233 03/07/20 0629 03/08/20 0229  NA 142  --  140  K 3.6  --  3.7  CL 100  --  105  CO2 30  --  27  GLUCOSE 140*  --  185*  BUN 16  --  13  CREATININE 1.22 1.30* 0.94  CALCIUM 9.5  --  9.2  MG  --   --  2.4   Liver Function Tests: No results for input(s): AST, ALT, ALKPHOS, BILITOT, PROT, ALBUMIN in the last 168 hours. No results for input(s): LIPASE, AMYLASE in the last 168 hours. No results for input(s): AMMONIA in the last 168 hours. CBC: Recent Labs  Lab 03/07/20 0233 03/07/20 0629  WBC 7.0 6.3  NEUTROABS 3.0  --   HGB 14.9 14.9  HCT 48.0 47.7  MCV 106.9* 106.0*  PLT 162 161   Cardiac Enzymes: No results for input(s): CKTOTAL, CKMB, CKMBINDEX, TROPONINI in the  last 168 hours. BNP: Invalid input(s): POCBNP CBG: Recent Labs  Lab 03/07/20 0846 03/07/20 1207 03/07/20 2057 03/08/20 0744  GLUCAP 196* 225* 210* 108*   D-Dimer Recent Labs    03/07/20 0629  DDIMER 4.83*   Hgb A1c Recent Labs    03/07/20 0629  HGBA1C 6.0*   Lipid Profile No results for input(s): CHOL, HDL, LDLCALC, TRIG, CHOLHDL, LDLDIRECT in the last 72 hours. Thyroid function studies No results for input(s): TSH, T4TOTAL, T3FREE, THYROIDAB in the last 72 hours.  Invalid input(s): FREET3 Anemia work up No results for input(s): VITAMINB12, FOLATE, FERRITIN, TIBC, IRON, RETICCTPCT in the last 72 hours. Urinalysis    Component Value Date/Time   COLORURINE YELLOW 03/07/2020 0512   APPEARANCEUR CLEAR 03/07/2020 0512   LABSPEC 1.010 03/07/2020 0512   PHURINE 7.0 03/07/2020 0512   GLUCOSEU NEGATIVE 03/07/2020 0512   HGBUR NEGATIVE 03/07/2020 0512   BILIRUBINUR NEGATIVE 03/07/2020 0512   BILIRUBINUR small 08/23/2013 1736   KETONESUR NEGATIVE 03/07/2020 0512   PROTEINUR NEGATIVE 03/07/2020 0512   UROBILINOGEN 1.0 02/08/2015 1902   NITRITE NEGATIVE 03/07/2020 0512   LEUKOCYTESUR SMALL (A) 03/07/2020 0512    Sepsis Labs Invalid input(s): PROCALCITONIN,  WBC,  LACTICIDVEN Microbiology Recent Results (from the past 240 hour(s))  Respiratory Panel by RT PCR (Flu A&B, Covid) - Nasopharyngeal Swab     Status: None   Collection Time: 03/07/20  5:32 AM   Specimen: Nasopharyngeal Swab  Result Value Ref Range Status   SARS Coronavirus 2 by RT PCR NEGATIVE NEGATIVE Final    Comment: (NOTE) SARS-CoV-2 target nucleic acids are NOT DETECTED.  The SARS-CoV-2 RNA is generally detectable in upper respiratoy specimens during the acute phase of infection. The lowest concentration of SARS-CoV-2 viral copies this assay can detect is 131 copies/mL. A negative result does not preclude SARS-Cov-2 infection and should not be used as the sole basis for treatment or other patient management decisions. A negative result may occur with  improper specimen collection/handling, submission of specimen other than nasopharyngeal swab, presence of viral mutation(s) within the areas targeted by this assay, and inadequate number of viral copies (<131 copies/mL). A negative result must be combined with clinical observations, patient history, and epidemiological information. The expected result is Negative.  Fact Sheet for Patients:  PinkCheek.be  Fact Sheet for Healthcare Providers:  GravelBags.it  This test is no t yet approved or cleared by the [place] FDA and  has been authorized for detection and/or diagnosis of SARS-CoV-2 by FDA under an Emergency Use Authorization (EUA). This EUA will remain  in effect (meaning this test can be used) for the duration of the COVID-19 declaration under Section 564(b)(1) of the Act, 21 U.S.C. section 360bbb-3(b)(1), unless the authorization is terminated or revoked sooner.     Influenza A by PCR NEGATIVE NEGATIVE Final   Influenza B by PCR NEGATIVE NEGATIVE Final    Comment: (NOTE) The Xpert Xpress  SARS-CoV-2/FLU/RSV assay is intended as an aid in  the diagnosis of influenza from Nasopharyngeal swab specimens and  should not be used as a sole basis for treatment. Nasal washings and  aspirates are unacceptable for Xpert Xpress SARS-CoV-2/FLU/RSV  testing.  Fact Sheet for Patients: PinkCheek.be  Fact Sheet for Healthcare Providers: GravelBags.it  This test is not yet approved or cleared by the [place] FDA and  has been authorized for detection and/or diagnosis of SARS-CoV-2 by  FDA under an Emergency Use Authorization (EUA). This EUA will remain  in effect (meaning this  test can be used) for the duration of the  Covid-19 declaration under Section 564(b)(1) of the Act, 21  U.S.C. section 360bbb-3(b)(1), unless the authorization is  terminated or revoked. Performed at [facility], [place] [address] [place], [place] [address]   Culture, Urine     Status: None   Collection Time: 03/07/20  7:05 AM   Specimen: Urine, Random  Result Value Ref Range Status   Specimen Description URINE, RANDOM  Final   Special Requests NONE  Final   Culture   Final    NO GROWTH Performed at [facility], [place] [address], [place], [place] [address]    Report Status 03/08/2020 FINAL  Final     Time coordinating discharge: Over 30 minutes  SIGNED:   Jarick Harkins J [place] DO  [facility] 03/08/2020, 10:40 AM

## 2020-03-08 NOTE — Plan of Care (Signed)
  Problem: Acute Rehab PT Goals(only PT should resolve) Goal: Patient Will Transfer Sit To/From Stand Outcome: Adequate for Discharge Goal: Pt Will Ambulate Outcome: Adequate for Discharge

## 2020-03-08 NOTE — TOC Transition Note (Signed)
Transition of Care [facility]) - CM/SW Discharge Note   Patient Details  Name: Chris Payne MRN: [id number] Date of Birth: 08/22/1950  Transition of [facility]) CM/SW Contact:  Angelita Ingles, RN Phone Number: [phone]  03/08/2020, 12:50 PM   Clinical Narrative:    CM consulted for patient requesting another rollator due to last one being stolen and needs for home health PT. Patient is unable to get another rollator due to last pay per insurance was less than 5 years ago. Insurance will only pay every 5 years. Patient has been made aware. CM also discussed outpatient therapy with patient. Patient states that he does have transportation to outpatient therapy and is willing to try it. Ambulatory referral for outpatient  PT/OT has been entered. Info added to avs. No other needs noted at this time TOC will sign off.    Final next level of care: OP Rehab Barriers to Discharge: No Barriers Identified   Patient Goals and CMS Choice Patient states their goals for this hospitalization and ongoing recovery are:: Patient states that he is ready to go home.      Discharge Placement                       Discharge Plan and Services                DME Arranged: N/A (unable to get rollator due to less than 5 years since ins paid for one)                    Social Determinants of Health (SDOH) Interventions     Readmission Risk Interventions No flowsheet data found.

## 2020-03-08 NOTE — Discharge Instructions (Signed)

## 2020-03-08 NOTE — Care Management Obs Status (Signed)
[facility] NOTIFICATION   Patient Details  Name: JAIRUS TONNE MRN: [id number] Date of Birth: 27-Nov-1950   Medicare Observation Status Notification Given:  Yes    Angelita Ingles, RN 03/08/2020, 12:25 PM

## 2020-04-05 DIAGNOSIS — I2510 Atherosclerotic heart disease of native coronary artery without angina pectoris: Secondary | ICD-10-CM | POA: Diagnosis not present

## 2020-04-05 DIAGNOSIS — E1165 Type 2 diabetes mellitus with hyperglycemia: Secondary | ICD-10-CM | POA: Diagnosis not present

## 2020-04-05 DIAGNOSIS — I10 Essential (primary) hypertension: Secondary | ICD-10-CM | POA: Diagnosis not present

## 2020-04-05 DIAGNOSIS — J449 Chronic obstructive pulmonary disease, unspecified: Secondary | ICD-10-CM | POA: Diagnosis not present

## 2020-04-05 DIAGNOSIS — E782 Mixed hyperlipidemia: Secondary | ICD-10-CM | POA: Diagnosis not present

## 2020-06-18 DIAGNOSIS — M792 Neuralgia and neuritis, unspecified: Secondary | ICD-10-CM | POA: Diagnosis not present

## 2020-06-18 DIAGNOSIS — M79671 Pain in right foot: Secondary | ICD-10-CM | POA: Diagnosis not present

## 2020-06-18 DIAGNOSIS — M79672 Pain in left foot: Secondary | ICD-10-CM | POA: Diagnosis not present

## 2020-07-01 DIAGNOSIS — L603 Nail dystrophy: Secondary | ICD-10-CM | POA: Diagnosis not present

## 2020-07-01 DIAGNOSIS — I739 Peripheral vascular disease, unspecified: Secondary | ICD-10-CM | POA: Diagnosis not present

## 2020-07-24 DIAGNOSIS — E1165 Type 2 diabetes mellitus with hyperglycemia: Secondary | ICD-10-CM | POA: Diagnosis not present

## 2020-07-24 DIAGNOSIS — R053 Chronic cough: Secondary | ICD-10-CM | POA: Diagnosis not present

## 2020-07-24 DIAGNOSIS — R609 Edema, unspecified: Secondary | ICD-10-CM | POA: Diagnosis not present

## 2020-07-24 DIAGNOSIS — I2510 Atherosclerotic heart disease of native coronary artery without angina pectoris: Secondary | ICD-10-CM | POA: Diagnosis not present

## 2020-07-24 DIAGNOSIS — I10 Essential (primary) hypertension: Secondary | ICD-10-CM | POA: Diagnosis not present

## 2020-07-24 DIAGNOSIS — E782 Mixed hyperlipidemia: Secondary | ICD-10-CM | POA: Diagnosis not present

## 2020-07-24 DIAGNOSIS — Z131 Encounter for screening for diabetes mellitus: Secondary | ICD-10-CM | POA: Diagnosis not present

## 2020-07-24 DIAGNOSIS — Z0000 Encounter for general adult medical examination without abnormal findings: Secondary | ICD-10-CM | POA: Diagnosis not present

## 2020-07-24 DIAGNOSIS — L989 Disorder of the skin and subcutaneous tissue, unspecified: Secondary | ICD-10-CM | POA: Diagnosis not present

## 2020-07-24 DIAGNOSIS — J449 Chronic obstructive pulmonary disease, unspecified: Secondary | ICD-10-CM | POA: Diagnosis not present

## 2020-09-04 ENCOUNTER — Other Ambulatory Visit: Payer: Self-pay

## 2020-09-04 ENCOUNTER — Encounter (HOSPITAL_COMMUNITY): Payer: Self-pay

## 2020-09-04 ENCOUNTER — Emergency Department (HOSPITAL_COMMUNITY): Payer: Medicare Other

## 2020-09-04 ENCOUNTER — Emergency Department (HOSPITAL_COMMUNITY)
Admission: EM | Admit: 2020-09-04 | Discharge: 2020-09-04 | Disposition: A | Discharge status: Home / Self Care | Payer: Medicare Other | Attending: Emergency Medicine | Admitting: Emergency Medicine

## 2020-09-04 DIAGNOSIS — Z20822 Contact with and (suspected) exposure to covid-19: Secondary | ICD-10-CM | POA: Insufficient documentation

## 2020-09-04 DIAGNOSIS — L219 Seborrheic dermatitis, unspecified: Secondary | ICD-10-CM | POA: Diagnosis not present

## 2020-09-04 DIAGNOSIS — R0602 Shortness of breath: Secondary | ICD-10-CM | POA: Diagnosis not present

## 2020-09-04 DIAGNOSIS — Z955 Presence of coronary angioplasty implant and graft: Secondary | ICD-10-CM | POA: Diagnosis not present

## 2020-09-04 DIAGNOSIS — I110 Hypertensive heart disease with heart failure: Secondary | ICD-10-CM | POA: Insufficient documentation

## 2020-09-04 DIAGNOSIS — Z79899 Other long term (current) drug therapy: Secondary | ICD-10-CM | POA: Diagnosis not present

## 2020-09-04 DIAGNOSIS — I2510 Atherosclerotic heart disease of native coronary artery without angina pectoris: Secondary | ICD-10-CM | POA: Insufficient documentation

## 2020-09-04 DIAGNOSIS — J189 Pneumonia, unspecified organism: Secondary | ICD-10-CM | POA: Diagnosis not present

## 2020-09-04 DIAGNOSIS — Z87891 Personal history of nicotine dependence: Secondary | ICD-10-CM | POA: Diagnosis not present

## 2020-09-04 DIAGNOSIS — Z7951 Long term (current) use of inhaled steroids: Secondary | ICD-10-CM | POA: Diagnosis not present

## 2020-09-04 DIAGNOSIS — Z743 Need for continuous supervision: Secondary | ICD-10-CM | POA: Diagnosis not present

## 2020-09-04 DIAGNOSIS — R059 Cough, unspecified: Secondary | ICD-10-CM | POA: Diagnosis not present

## 2020-09-04 DIAGNOSIS — R6889 Other general symptoms and signs: Secondary | ICD-10-CM | POA: Diagnosis not present

## 2020-09-04 DIAGNOSIS — Z7984 Long term (current) use of oral hypoglycemic drugs: Secondary | ICD-10-CM | POA: Insufficient documentation

## 2020-09-04 DIAGNOSIS — Z7982 Long term (current) use of aspirin: Secondary | ICD-10-CM | POA: Diagnosis not present

## 2020-09-04 DIAGNOSIS — I10 Essential (primary) hypertension: Secondary | ICD-10-CM | POA: Diagnosis not present

## 2020-09-04 DIAGNOSIS — R21 Rash and other nonspecific skin eruption: Secondary | ICD-10-CM | POA: Diagnosis not present

## 2020-09-04 DIAGNOSIS — I5023 Acute on chronic systolic (congestive) heart failure: Secondary | ICD-10-CM | POA: Insufficient documentation

## 2020-09-04 DIAGNOSIS — J449 Chronic obstructive pulmonary disease, unspecified: Secondary | ICD-10-CM | POA: Diagnosis not present

## 2020-09-04 DIAGNOSIS — I499 Cardiac arrhythmia, unspecified: Secondary | ICD-10-CM | POA: Diagnosis not present

## 2020-09-04 LAB — CBC WITH DIFFERENTIAL/PLATELET
Abs Immature Granulocytes: 0.01 10*3/uL (ref 0.00–0.07)
Basophils Absolute: 0 10*3/uL (ref 0.0–0.1)
Basophils Relative: 0 %
Eosinophils Absolute: 0.1 10*3/uL (ref 0.0–0.5)
Eosinophils Relative: 1 %
HCT: 53 % — ABNORMAL HIGH (ref 39.0–52.0)
Hemoglobin: 16.3 g/dL (ref 13.0–17.0)
Immature Granulocytes: 0 %
Lymphocytes Relative: 20 %
Lymphs Abs: 1.7 10*3/uL (ref 0.7–4.0)
MCH: 31.7 pg (ref 26.0–34.0)
MCHC: 30.8 g/dL (ref 30.0–36.0)
MCV: 103.1 fL — ABNORMAL HIGH (ref 80.0–100.0)
Monocytes Absolute: 0.7 10*3/uL (ref 0.1–1.0)
Monocytes Relative: 8 %
Neutro Abs: 6.1 10*3/uL (ref 1.7–7.7)
Neutrophils Relative %: 71 %
Platelets: 125 10*3/uL — ABNORMAL LOW (ref 150–400)
RBC: 5.14 MIL/uL (ref 4.22–5.81)
RDW: 12.8 % (ref 11.5–15.5)
WBC: 8.6 10*3/uL (ref 4.0–10.5)
nRBC: 0 % (ref 0.0–0.2)

## 2020-09-04 LAB — COMPREHENSIVE METABOLIC PANEL
ALT: 13 U/L (ref 0–44)
AST: 14 U/L — ABNORMAL LOW (ref 15–41)
Albumin: 3.5 g/dL (ref 3.5–5.0)
Alkaline Phosphatase: 96 U/L (ref 38–126)
Anion gap: 8 (ref 5–15)
BUN: 11 mg/dL (ref 8–23)
CO2: 29 mmol/L (ref 22–32)
Calcium: 9.4 mg/dL (ref 8.9–10.3)
Chloride: 104 mmol/L (ref 98–111)
Creatinine, Ser: 0.98 mg/dL (ref 0.61–1.24)
GFR, Estimated: >60 mL/min (ref >60)
Glucose, Bld: 88 mg/dL (ref 70–99)
Potassium: 3.8 mmol/L (ref 3.5–5.1)
Sodium: 141 mmol/L (ref 135–145)
Total Bilirubin: 0.6 mg/dL (ref 0.3–1.2)
Total Protein: 7.1 g/dL (ref 6.5–8.1)

## 2020-09-04 LAB — SARS CORONAVIRUS 2 (TAT 6-24 HRS): SARS Coronavirus 2: NEGATIVE

## 2020-09-04 LAB — BRAIN NATRIURETIC PEPTIDE: B Natriuretic Peptide: 31.6 pg/mL (ref 0.0–100.0)

## 2020-09-04 MED ORDER — TRIAMCINOLONE ACETONIDE 0.1 % EX CREA: 1.0000 "application " | TOPICAL_CREAM | Freq: Two times a day (BID) | CUTANEOUS | 0 refills | Status: DC

## 2020-09-04 MED ORDER — AMOXICILLIN 500 MG PO CAPS
1000.0000 mg | ORAL_CAPSULE | Freq: Three times a day (TID) | ORAL | Status: DC
  Administered 2020-09-04: 1000 mg via ORAL
  Filled 2020-09-04: qty 2

## 2020-09-04 MED ORDER — DOXYCYCLINE HYCLATE 100 MG PO CAPS: 100.0000 mg | ORAL_CAPSULE | Freq: Two times a day (BID) | ORAL | 0 refills | Status: DC

## 2020-09-04 MED ORDER — AMOXICILLIN 500 MG PO CAPS: 1000.0000 mg | ORAL_CAPSULE | Freq: Three times a day (TID) | ORAL | 0 refills | Status: AC

## 2020-09-04 MED ORDER — DOXYCYCLINE HYCLATE 100 MG PO TABS
100.0000 mg | ORAL_TABLET | Freq: Once | ORAL | Status: AC
  Administered 2020-09-04: 100 mg via ORAL
  Filled 2020-09-04: qty 1

## 2020-09-04 NOTE — Discharge Instructions (Addendum)
You are seen in the ER for cough and rash.  Take the medications provided for rash.  We think that you might have seborrheic dermatitis.  Primary care doctor evaluation is critical to ensure right diagnosis and management.  Avoid sun.  The cough could be because of pneumonia, as the x-ray is concerning for it.  Take the antibiotics prescribed.  Return to the ER if you start having worsening shortness of breath, chest pain, confusion.

## 2020-09-04 NOTE — ED Triage Notes (Signed)
Pt comes from home via EMS. EMS called out for rash on face and on back of head, onset today. Pt reports ongoing abdominal and bilateral LE swelling, sob with exertion. Pt compliant with lasix and has been voiding normally. Pt denies any allergies or contact with poison ivy etc. Pt also reports cough x 1week with thick "discolored" mucus. Pt denies changes in medication or routine. Pt a/o x4.  BP 150/90 HR 110 RR 24 O2 94% (placed on supplemental O2 for comfort)

## 2020-09-04 NOTE — ED Notes (Signed)
Help get patient undressed on the monitor did ekg shown to er provider patient is resting with call bell in reacgh

## 2020-09-05 DIAGNOSIS — Z5189 Encounter for other specified aftercare: Secondary | ICD-10-CM | POA: Diagnosis not present

## 2020-09-06 NOTE — ED Provider Notes (Signed)
[facility] EMERGENCY DEPARTMENT Provider Note   CSN: [id number] Arrival date & time: 09/04/20  1407     History Chief Complaint  Patient presents with  . Rash  . Cough    Chris Payne is a 70 y.o. male.  HPI    70 Y.O MALE comes in with cc of rash and cough. Hx of AAA, CAD, COPD, CHF. He reports cough for the last several days. There is no chest pain, worsening dib, fevers. + rash to the face over the last 2-3 days. Rash is expanding to the face only. No skin dz, allergies and no exposures to new irritants. Rash is itchy.  Past Medical History:  Diagnosis Date  . AAA (abdominal aortic aneurysm) (HCC)    s/p repair 6/11  . CAD (coronary artery disease)    s/p CABG 2/12:  LIMA to LAD, SVG to diagonal-1, SVG to ramus intermedius,, SVG to AM (Dr. Dorris Fetch) ;  b.  Myoview 10/13:  inf infarct with very mild peri-infarct ischemia, EF 49%, inf HK; c  He has severe three-vessel native coronary artery disease. Catheterization March 2015 as above  . CAP (community acquired pneumonia) 02/23/2014  . Carotid stenosis    a.  dopplers 8/12: LICA 50-69%;  b. Carotid dopplers 3/14:  R 0-39%, L 60-79%, repeat 6 mos  . CHF (congestive heart failure) (HCC)   . COPD (chronic obstructive pulmonary disease) (HCC)   . GERD (gastroesophageal reflux disease)   . Headache(784.0)   . Hyperlipidemia   . Hypertension   . Hypoxia 02/23/2014  . Inguinal hernia   . Myocardial infarction [facility])    PCI x3  . PAD (peripheral artery disease) (HCC)    ABIs 4/12:  R 0.88, L 0.92; R SFA 40%, L CFA 50%, L SFA 50-60%  . Pneumonia    hx  . Stroke [facility])    h/o pontine CVA  . Thyroid nodule    incidental finding on carotid doppler 3/14 => thyroid U/S ordered    Patient Active Problem List   Diagnosis Date Noted  . Pressure injury of skin 03/08/2020  . Candida rash of groin 10/19/2018  . Acute on chronic systolic heart failure (HCC) 10/17/2018  . Acute on chronic systolic  (congestive) heart failure (HCC) 10/17/2018  . COPD exacerbation (HCC) 05/21/2018  . Chronic pain syndrome 07/19/2015  . Degenerative disc disease, lumbar 07/19/2015  . Weakness 04/17/2015  . Generalized weakness 04/17/2015  . Polycythemia 04/17/2015  . BPH (benign prostatic hyperplasia) 04/17/2015  . Seizure disorder (HCC) 10/12/2014  . TIA (transient ischemic attack) 08/23/2013  . Other and unspecified angina pectoris 07/04/2013  . Nontoxic multinodular goiter 10/07/2012  . Unstable angina (HCC) 06/19/2012  . Carotid stenosis   . PAD (peripheral artery disease) (HCC)   . Left inguinal hernia 05/30/2012  . COPD (chronic obstructive pulmonary disease) (HCC) 08/24/2011  . S/P AAA repair 08/24/2011  . Lumbar disc disease   . Hypertensive heart disease   . Hyperlipidemia   . CAD (coronary artery disease)   . History of pontine CVA   . GERD (gastroesophageal reflux disease)     Past Surgical History:  Procedure Laterality Date  . ABDOMINAL AORTIC ANEURYSM REPAIR  10-03-2009  . CORONARY ARTERY BYPASS GRAFT  06/24/2010   LIMA to the LAD, SVG to first diagonal, SVG to ramus intermediate, SVG to acute marginal. EF 50%. 2/12  . EXPLORATION POST OPERATIVE OPEN HEART    . EXPLORATORY LAPAROTOMY  09/2009   ligation  of lumbar arteries  . EXTERNAL EAR SURGERY Left    laceration child  . HIP SURGERY  40 years ago   left hip bone removal and pinning  . INGUINAL HERNIA REPAIR Left 07/21/2012   Procedure: HERNIA REPAIR INGUINAL ADULT;  Surgeon: Merrie Roof, MD;  Location: [place];  Service: General;  Laterality: Left;  . INSERTION OF MESH Left 07/21/2012   Procedure: INSERTION OF MESH;  Surgeon: Merrie Roof, MD;  Location: [place];  Service: General;  Laterality: Left;  . LEFT HEART CATHETERIZATION WITH CORONARY/GRAFT ANGIOGRAM  06/21/2012   Procedure: LEFT HEART CATHETERIZATION WITH Beatrix Fetters;  Surgeon: Minus Breeding, MD;  Location: [facility] CATH LAB;  Service: Cardiovascular;;  .  LEFT HEART CATHETERIZATION WITH CORONARY/GRAFT ANGIOGRAM N/A 07/04/2013   Procedure: LEFT HEART CATHETERIZATION WITH Beatrix Fetters;  Surgeon: Sinclair Grooms, MD;  Location: [facility] CATH LAB;  Service: Cardiovascular;  Laterality: N/A;  . NM MYOCAR PERF WALL MOTION  12/02/2010   inferoapical defect is fixed c/w prior infarction/scar, there is minimal borderzone ischemia.       Family History  Problem Relation Age of Onset  . Alcohol abuse Father   . Cancer Sister 60       unknown primary  . Diabetes Mother   . Hypertension Mother   . Cancer Sister 20       kidney cancer    Social History   Tobacco Use  . Smoking status: Former Smoker    Packs/day: 1.00    Years: 30.00    Pack years: 30.00    Types: Cigarettes    Quit date: 01/25/2010    Years since quitting: 10.6  . Smokeless tobacco: Never Used  . Tobacco comment: "havent smoked in 7 years"  Vaping Use  . Vaping Use: Never used  Substance Use Topics  . Alcohol use: No    Comment: former drinker - Quit 2011.  . Drug use: No    Home Medications Prior to Admission medications   Medication Sig Start Date End Date Taking? Authorizing Provider  amoxicillin (AMOXIL) 500 MG capsule Take 2 capsules (1,000 mg total) by mouth 3 (three) times daily for 7 days. 09/04/20 09/11/20 Yes Varney Biles, MD  doxycycline (VIBRAMYCIN) 100 MG capsule Take 1 capsule (100 mg total) by mouth 2 (two) times daily. 09/04/20  Yes Kileigh Ortmann, MD  triamcinolone cream (KENALOG) 0.1 % Apply 1 application topically 2 (two) times daily. 09/04/20  Yes Varney Biles, MD  albuterol (VENTOLIN HFA) 108 (90 Base) MCG/ACT inhaler Inhale 2 puffs into the lungs every 6 (six) hours as needed for wheezing or shortness of breath. 03/08/20   [place] Donnamarie Poag, DO  aspirin EC 81 MG EC tablet Take 1 tablet (81 mg total) by mouth daily. Patient not taking: Reported on 03/07/2020 05/24/18   Geradine Girt, DO  budesonide-formoterol [facility]) 160-4.5 MCG/ACT  inhaler Inhale 2 puffs into the lungs 2 (two) times daily. 03/08/20   [place] Donnamarie Poag, DO  furosemide (LASIX) 20 MG tablet Take 1 tablet (20 mg total) by mouth daily. 02/10/20   Deno Etienne, DO  gabapentin (NEURONTIN) 300 MG capsule Take 300 mg by mouth 2 (two) times daily. 05/14/18   [provider]  metFORMIN (GLUCOPHAGE) 500 MG tablet Take 0.5 tablets (250 mg total) by mouth 2 (two) times daily. 03/08/20 06/06/20  [place] Eric J, DO  metoprolol tartrate (LOPRESSOR) 25 MG tablet Take 1 tablet (25 mg total) by mouth 2 (two) times daily.  03/08/20 06/06/20  [place] Eric J, DO  nystatin (MYCOSTATIN/NYSTOP) powder Apply topically 3 (three) times daily. Patient taking differently: Apply 1 application topically 3 (three) times daily.  10/19/18   Patwardhan, Reynold Bowen, MD  rosuvastatin (CRESTOR) 40 MG tablet Take 1 tablet (40 mg total) by mouth daily. 03/08/20 06/06/20  [place] Eric J, DO  sacubitril-valsartan (ENTRESTO) 24-26 MG Take 1 tablet by mouth every other day. 03/08/20   [place] Donnamarie Poag, DO  tamsulosin (FLOMAX) 0.4 MG CAPS capsule Take 0.4 mg by mouth daily. 12/12/19   [provider]  tiotropium (SPIRIVA HANDIHALER) 18 MCG inhalation capsule Place 1 capsule (18 mcg total) into inhaler and inhale daily. 03/08/20 06/06/20  [place] Eric J, DO    Allergies    Patient has no known allergies.  Review of Systems   Review of Systems  Constitutional: Positive for activity change.  Respiratory: Positive for cough.   Cardiovascular: Negative for chest pain.  Skin: Positive for rash.  All other systems reviewed and are negative.   Physical Exam Updated Vital Signs BP 132/87   Pulse 94   Temp 97.6 F (36.4 C) (Oral)   Resp (!) 22   SpO2 93%   Physical Exam Vitals and nursing note reviewed.  Constitutional:      Appearance: He is well-developed.  HENT:     Head: Atraumatic.  Cardiovascular:     Rate and Rhythm: Normal rate.  Pulmonary:     Effort: Pulmonary effort is  normal.     Breath sounds: No wheezing.  Musculoskeletal:        General: No swelling or tenderness.     Cervical back: Neck supple.  Skin:    General: Skin is warm.     Findings: Rash present.     Comments: Pt has patches to the face, some scaly. There are some areas w/ confluence of papules. No oral or ocular involvement.  Neurological:     Mental Status: He is alert and oriented to person, place, and time.     ED Results / Procedures / Treatments   Labs (all labs ordered are listed, but only abnormal results are displayed) Labs Reviewed  CBC WITH DIFFERENTIAL/PLATELET - Abnormal; Notable for the following components:      Result Value   HCT 53.0 (*)    MCV 103.1 (*)    Platelets 125 (*)    All other components within normal limits  COMPREHENSIVE METABOLIC PANEL - Abnormal; Notable for the following components:   AST 14 (*)    All other components within normal limits  SARS CORONAVIRUS 2 (TAT 6-24 HRS)  BRAIN NATRIURETIC PEPTIDE    EKG EKG Interpretation  Date/Time:  Wednesday Sep 04 2020 14:09:08 EDT Ventricular Rate:  96 PR Interval:  182 QRS Duration: 103 QT Interval:  380 QTC Calculation: 481 R Axis:   -20 Text Interpretation: Sinus rhythm Borderline left axis deviation Borderline prolonged QT interval Confirmed by Orpah Greek ([id number]) on 09/05/2020 8:39:11 AM   Radiology DG Chest Port 1 View  Result Date: 09/04/2020 CLINICAL DATA:  Cough EXAM: PORTABLE CHEST 1 VIEW COMPARISON:  March 07, 2020 FINDINGS: There is ill-defined airspace opacity in each lung base. Lungs elsewhere are clear. Heart size and pulmonary vascularity are normal. No adenopathy. Patient is status post coronary artery bypass grafting. There is aortic atherosclerosis. No bone lesions. IMPRESSION: Ill-defined opacity in each lung base, likely developing pneumonia in the lung bases. Lungs otherwise clear. Stable cardiac silhouette. Status post coronary artery  bypass grafting. Aortic  Atherosclerosis (ICD10-I70.0). Electronically Signed   By: Lowella Grip III M.D.   On: 09/04/2020 16:57    Procedures Procedures   Medications Ordered in ED Medications  doxycycline (VIBRA-TABS) tablet 100 mg (100 mg Oral Given 09/04/20 2006)    ED Course  I have reviewed the triage vital signs and the nursing notes.  Pertinent labs & imaging results that were available during my care of the patient were reviewed by me and considered in my medical decision making (see chart for details).    MDM Rules/Calculators/A&P                          Pt comes in with cc of cough and rash.  Rash appears to be seborrheic dermatitis - will give low potency topical steroid and advise pcp f/u.  Cough - ? PNA/copd exacerbation vs. Allergies. CXR does show somwe concerns for cap - will treat.  Strict ER return precautions have been discussed, and patient is agreeing with the plan and is comfortable with the workup done and the recommendations from the ER.   Final Clinical Impression(s) / ED Diagnoses Final diagnoses:  Community acquired pneumonia, unspecified laterality  Seborrheic dermatitis    Rx / DC Orders ED Discharge Orders         Ordered    triamcinolone cream (KENALOG) 0.1 %  2 times daily        09/04/20 2000    amoxicillin (AMOXIL) 500 MG capsule  3 times daily        09/04/20 2000    doxycycline (VIBRAMYCIN) 100 MG capsule  2 times daily        09/04/20 Brunetta Jeans, MD 09/06/20 [phone]

## 2020-09-15 ENCOUNTER — Emergency Department (HOSPITAL_COMMUNITY)
Admission: EM | Admit: 2020-09-15 | Discharge: 2020-09-15 | Disposition: A | Discharge status: Home / Self Care | Payer: Medicare Other | Attending: Emergency Medicine | Admitting: Emergency Medicine

## 2020-09-15 ENCOUNTER — Emergency Department (HOSPITAL_COMMUNITY): Payer: Medicare Other

## 2020-09-15 ENCOUNTER — Encounter (HOSPITAL_COMMUNITY): Payer: Self-pay | Admitting: Emergency Medicine

## 2020-09-15 ENCOUNTER — Other Ambulatory Visit: Payer: Self-pay

## 2020-09-15 DIAGNOSIS — R600 Localized edema: Secondary | ICD-10-CM | POA: Diagnosis not present

## 2020-09-15 DIAGNOSIS — M79604 Pain in right leg: Secondary | ICD-10-CM | POA: Diagnosis not present

## 2020-09-15 DIAGNOSIS — Z951 Presence of aortocoronary bypass graft: Secondary | ICD-10-CM | POA: Diagnosis not present

## 2020-09-15 DIAGNOSIS — N200 Calculus of kidney: Secondary | ICD-10-CM | POA: Diagnosis not present

## 2020-09-15 DIAGNOSIS — R0789 Other chest pain: Secondary | ICD-10-CM

## 2020-09-15 DIAGNOSIS — K5900 Constipation, unspecified: Secondary | ICD-10-CM | POA: Diagnosis not present

## 2020-09-15 DIAGNOSIS — Z87891 Personal history of nicotine dependence: Secondary | ICD-10-CM | POA: Insufficient documentation

## 2020-09-15 DIAGNOSIS — I5023 Acute on chronic systolic (congestive) heart failure: Secondary | ICD-10-CM | POA: Diagnosis not present

## 2020-09-15 DIAGNOSIS — R0602 Shortness of breath: Secondary | ICD-10-CM | POA: Diagnosis not present

## 2020-09-15 DIAGNOSIS — R109 Unspecified abdominal pain: Secondary | ICD-10-CM

## 2020-09-15 DIAGNOSIS — Z79899 Other long term (current) drug therapy: Secondary | ICD-10-CM | POA: Insufficient documentation

## 2020-09-15 DIAGNOSIS — Z7951 Long term (current) use of inhaled steroids: Secondary | ICD-10-CM | POA: Diagnosis not present

## 2020-09-15 DIAGNOSIS — J441 Chronic obstructive pulmonary disease with (acute) exacerbation: Secondary | ICD-10-CM | POA: Insufficient documentation

## 2020-09-15 DIAGNOSIS — Z743 Need for continuous supervision: Secondary | ICD-10-CM | POA: Diagnosis not present

## 2020-09-15 DIAGNOSIS — I2510 Atherosclerotic heart disease of native coronary artery without angina pectoris: Secondary | ICD-10-CM | POA: Insufficient documentation

## 2020-09-15 DIAGNOSIS — R100 Acute abdomen: Secondary | ICD-10-CM | POA: Diagnosis not present

## 2020-09-15 DIAGNOSIS — I110 Hypertensive heart disease with heart failure: Secondary | ICD-10-CM | POA: Diagnosis not present

## 2020-09-15 DIAGNOSIS — R6889 Other general symptoms and signs: Secondary | ICD-10-CM | POA: Diagnosis not present

## 2020-09-15 DIAGNOSIS — M79605 Pain in left leg: Secondary | ICD-10-CM | POA: Diagnosis not present

## 2020-09-15 DIAGNOSIS — R609 Edema, unspecified: Secondary | ICD-10-CM | POA: Diagnosis not present

## 2020-09-15 LAB — COMPREHENSIVE METABOLIC PANEL
ALT: 12 U/L (ref 0–44)
AST: 15 U/L (ref 15–41)
Albumin: 3.6 g/dL (ref 3.5–5.0)
Alkaline Phosphatase: 87 U/L (ref 38–126)
Anion gap: 9 (ref 5–15)
BUN: 15 mg/dL (ref 8–23)
CO2: 31 mmol/L (ref 22–32)
Calcium: 9.3 mg/dL (ref 8.9–10.3)
Chloride: 103 mmol/L (ref 98–111)
Creatinine, Ser: 0.95 mg/dL (ref 0.61–1.24)
GFR, Estimated: >60 mL/min (ref >60)
Glucose, Bld: 104 mg/dL — ABNORMAL HIGH (ref 70–99)
Potassium: 4.2 mmol/L (ref 3.5–5.1)
Sodium: 143 mmol/L (ref 135–145)
Total Bilirubin: 0.9 mg/dL (ref 0.3–1.2)
Total Protein: 7.3 g/dL (ref 6.5–8.1)

## 2020-09-15 LAB — URINALYSIS, ROUTINE W REFLEX MICROSCOPIC
Bilirubin Urine: NEGATIVE
Glucose, UA: NEGATIVE mg/dL
Hgb urine dipstick: NEGATIVE
Ketones, ur: NEGATIVE mg/dL
Leukocytes,Ua: NEGATIVE
Nitrite: NEGATIVE
Protein, ur: NEGATIVE mg/dL
Specific Gravity, Urine: 1.017 (ref 1.005–1.030)
pH: 7 (ref 5.0–8.0)

## 2020-09-15 LAB — CBC WITH DIFFERENTIAL/PLATELET
Abs Immature Granulocytes: 0.03 10*3/uL (ref 0.00–0.07)
Basophils Absolute: 0 10*3/uL (ref 0.0–0.1)
Basophils Relative: 1 %
Eosinophils Absolute: 0.1 10*3/uL (ref 0.0–0.5)
Eosinophils Relative: 1 %
HCT: 52 % (ref 39.0–52.0)
Hemoglobin: 16.4 g/dL (ref 13.0–17.0)
Immature Granulocytes: 0 %
Lymphocytes Relative: 20 %
Lymphs Abs: 1.7 10*3/uL (ref 0.7–4.0)
MCH: 32.1 pg (ref 26.0–34.0)
MCHC: 31.5 g/dL (ref 30.0–36.0)
MCV: 101.8 fL — ABNORMAL HIGH (ref 80.0–100.0)
Monocytes Absolute: 0.6 10*3/uL (ref 0.1–1.0)
Monocytes Relative: 7 %
Neutro Abs: 5.8 10*3/uL (ref 1.7–7.7)
Neutrophils Relative %: 71 %
Platelets: 211 10*3/uL (ref 150–400)
RBC: 5.11 MIL/uL (ref 4.22–5.81)
RDW: 13 % (ref 11.5–15.5)
WBC: 8.2 10*3/uL (ref 4.0–10.5)
nRBC: 0 % (ref 0.0–0.2)

## 2020-09-15 LAB — BRAIN NATRIURETIC PEPTIDE: B Natriuretic Peptide: 43.9 pg/mL (ref 0.0–100.0)

## 2020-09-15 LAB — TROPONIN I (HIGH SENSITIVITY): Troponin I (High Sensitivity): 4 ng/L (ref <18)

## 2020-09-15 NOTE — ED Provider Notes (Signed)
[facility] DEPT Provider Note   CSN: [id number] Arrival date & time: 09/15/20  0854     History Chief Complaint  Patient presents with  . Abdominal Pain    Chris Payne is a 70 y.o. male with past medical history significant for abdominal aortic aneurysm, CAD s/p CABG, community-acquired pneumonia, CHF, hypertension, MI, PAD, stroke.  Not anticoagulated.  HPI Presents to emergency room today with chief complaint of abdominal pain x3 days.  Pain is been progressively worsening.  He describes it as feeling bloated and abdominal tightness.  He had a bowel movement this morning that was small hard pellets he states.  There is no blood in his stool.  He rates his pain currently 2 out of 10 in severity.  He has normal appetite. He also states he has had intermittent chest pain for the last 3 months.  He has not had this checked out.  He admits to history of heartburn and takes over-the-counter medications for that.  He also has bilateral lower extremity leg swelling.  He wears a compression stocking for.  He states his swelling is unchanged today.  Patient recently was diagnosed with community-acquired pneumonia and treated with doxycycline.  He states his cough has resolved. Denies any fever, chills, shortness of breath, back pain, nausea, emesis, urinary symptoms, diarrhea.  Past Medical History:  Diagnosis Date  . AAA (abdominal aortic aneurysm) ([place])    s/p repair 6/11  . CAD (coronary artery disease)    s/p CABG 2/12:  LIMA to LAD, SVG to diagonal-1, SVG to ramus intermedius,, SVG to AM (Dr. Roxan Hockey) ;  b.  Myoview 10/13:  inf infarct with very mild peri-infarct ischemia, EF 49%, inf HK; c  He has severe three-vessel native coronary artery disease. Catheterization March 2015 as above  . CAP (community acquired pneumonia) 02/23/2014  . Carotid stenosis    a.  dopplers 0000000: LICA 0000000;  b. Carotid dopplers 3/14:  R 0-39%, L 60-79%, repeat 6 mos  . CHF  (congestive heart failure) ([place])   . COPD (chronic obstructive pulmonary disease) ([facility] [employer])   . GERD (gastroesophageal reflux disease)   . Headache(784.0)   . Hyperlipidemia   . Hypertension   . Hypoxia 02/23/2014  . Inguinal hernia   . Myocardial infarction [facility])    PCI x3  . PAD (peripheral artery disease) ([place])    ABIs 4/12:  R 0.88, L 0.92; R SFA 40%, L CFA 50%, L SFA 50-60%  . Pneumonia    hx  . Stroke [facility])    h/o pontine CVA  . Thyroid nodule    incidental finding on carotid doppler 3/14 => thyroid U/S ordered    Patient Active Problem List   Diagnosis Date Noted  . Pressure injury of skin 03/08/2020  . Candida rash of groin 10/19/2018  . Acute on chronic systolic heart failure ([place]) 10/17/2018  . Acute on chronic systolic (congestive) heart failure ([place]) 10/17/2018  . COPD exacerbation ([place]) 05/21/2018  . Chronic pain syndrome 07/19/2015  . Degenerative disc disease, lumbar 07/19/2015  . Weakness 04/17/2015  . Generalized weakness 04/17/2015  . Polycythemia 04/17/2015  . BPH (benign prostatic hyperplasia) 04/17/2015  . Seizure disorder ([place]) 10/12/2014  . TIA (transient ischemic attack) 08/23/2013  . Other and unspecified angina pectoris 07/04/2013  . Nontoxic multinodular goiter 10/07/2012  . Unstable angina ([place]) 06/19/2012  . Carotid stenosis   . PAD (peripheral artery disease) ([place])   . Left inguinal hernia 05/30/2012  . COPD (  chronic obstructive pulmonary disease) ([facility]) 08/24/2011  . S/P AAA repair 08/24/2011  . Lumbar disc disease   . Hypertensive heart disease   . Hyperlipidemia   . CAD (coronary artery disease)   . History of pontine CVA   . GERD (gastroesophageal reflux disease)     Past Surgical History:  Procedure Laterality Date  . ABDOMINAL AORTIC ANEURYSM REPAIR  10-03-2009  . CORONARY ARTERY BYPASS GRAFT  06/24/2010   LIMA to the LAD, SVG to first diagonal, SVG to ramus intermediate, SVG to acute marginal. EF 50%. 2/12  . EXPLORATION POST  OPERATIVE OPEN HEART    . EXPLORATORY LAPAROTOMY  09/2009   ligation of lumbar arteries  . EXTERNAL EAR SURGERY Left    laceration child  . HIP SURGERY  40 years ago   left hip bone removal and pinning  . INGUINAL HERNIA REPAIR Left 07/21/2012   Procedure: HERNIA REPAIR INGUINAL ADULT;  Surgeon: Robyne AskewPaul S Toth III, MD;  [facility];  Service: General;  Laterality: Left;  . INSERTION OF MESH Left 07/21/2012   Procedure: INSERTION OF MESH;  Surgeon: Robyne AskewPaul S Toth III, MD;  [facility];  Service: General;  Laterality: Left;  . LEFT HEART CATHETERIZATION WITH CORONARY/GRAFT ANGIOGRAM  06/21/2012   Procedure: LEFT HEART CATHETERIZATION WITH Isabel CapriceORONARY/GRAFT ANGIOGRAM;  Surgeon: Rollene RotundaJames Hochrein, MD;  [facility];  Service: Cardiovascular;;  . LEFT HEART CATHETERIZATION WITH CORONARY/GRAFT ANGIOGRAM N/A 07/04/2013   Procedure: LEFT HEART CATHETERIZATION WITH Isabel CapriceORONARY/GRAFT ANGIOGRAM;  Surgeon: Lesleigh NoeHenry W Smith III, MD;  [facility];  Service: Cardiovascular;  Laterality: N/A;  . NM MYOCAR PERF WALL MOTION  12/02/2010   inferoapical defect is fixed c/w prior infarction/scar, there is minimal borderzone ischemia.       Family History  Problem Relation Age of Onset  . Alcohol abuse Father   . Cancer Sister 258       unknown primary  . Diabetes Mother   . Hypertension Mother   . Cancer Sister 428       kidney cancer    Social History   Tobacco Use  . Smoking status: Former Smoker    Packs/day: 1.00    Years: 30.00    Pack years: 30.00    Types: Cigarettes    Quit date: 01/25/2010    Years since quitting: 10.6  . Smokeless tobacco: Never Used  . Tobacco comment: "havent smoked in 7 years"  Vaping Use  . Vaping Use: Never used  Substance Use Topics  . Alcohol use: No    Comment: former drinker - Quit 2011.  . Drug use: No    Home Medications Prior to Admission medications   Medication Sig Start Date End Date Taking? Authorizing Provider  albuterol (VENTOLIN HFA) 108  (90 Base) MCG/ACT inhaler Inhale 2 puffs into the lungs every 6 (six) hours as needed for wheezing or shortness of breath. 03/08/20   [place], Alvira PhilipsEric J, DO  aspirin EC 81 MG EC tablet Take 1 tablet (81 mg total) by mouth daily. Patient not taking: Reported on 03/07/2020 05/24/18   Joseph ArtVann, Jessica U, DO  budesonide-formoterol [facility](SYMBICORT) 160-4.5 MCG/ACT inhaler Inhale 2 puffs into the lungs 2 (two) times daily. 03/08/20   [place], Alvira PhilipsEric J, DO  doxycycline (VIBRAMYCIN) 100 MG capsule Take 1 capsule (100 mg total) by mouth 2 (two) times daily. 09/04/20   Derwood KaplanNanavati, Ankit, MD  furosemide (LASIX) 20 MG tablet Take 1 tablet (20 mg total) by mouth daily. 02/10/20   Adela LankFloyd,  Dan, DO  gabapentin (NEURONTIN) 300 MG capsule Take 300 mg by mouth 2 (two) times daily. 05/14/18   [provider]  metFORMIN (GLUCOPHAGE) 500 MG tablet Take 0.5 tablets (250 mg total) by mouth 2 (two) times daily. 03/08/20 06/06/20  [place] Eric J, DO  metoprolol tartrate (LOPRESSOR) 25 MG tablet Take 1 tablet (25 mg total) by mouth 2 (two) times daily. 03/08/20 06/06/20  [place] Eric J, DO  nystatin (MYCOSTATIN/NYSTOP) powder Apply topically 3 (three) times daily. Patient taking differently: Apply 1 application topically 3 (three) times daily.  10/19/18   Patwardhan, Reynold Bowen, MD  rosuvastatin (CRESTOR) 40 MG tablet Take 1 tablet (40 mg total) by mouth daily. 03/08/20 06/06/20  [place] Eric J, DO  sacubitril-valsartan (ENTRESTO) 24-26 MG Take 1 tablet by mouth every other day. 03/08/20   [place] Donnamarie Poag, DO  tamsulosin (FLOMAX) 0.4 MG CAPS capsule Take 0.4 mg by mouth daily. 12/12/19   [provider]  tiotropium (SPIRIVA HANDIHALER) 18 MCG inhalation capsule Place 1 capsule (18 mcg total) into inhaler and inhale daily. 03/08/20 06/06/20  [place] Eric J, DO  triamcinolone cream (KENALOG) 0.1 % Apply 1 application topically 2 (two) times daily. 09/04/20   Varney Biles, MD    Allergies    Patient has no known  allergies.  Review of Systems   Review of Systems All other systems are reviewed and are negative for acute change except as noted in the HPI.  Physical Exam Updated Vital Signs BP (!) 142/88 (BP Location: Right Arm)   Pulse 74   Temp 98 F (36.7 C) (Oral)   Resp 19   SpO2 96%   Physical Exam Vitals and nursing note reviewed.  Constitutional:      General: He is not in acute distress.    Appearance: He is not ill-appearing.  HENT:     Head: Normocephalic and atraumatic.     Right Ear: Tympanic membrane and external ear normal.     Left Ear: Tympanic membrane and external ear normal.     Nose: Nose normal.     Mouth/Throat:     Mouth: Mucous membranes are moist.     Pharynx: Oropharynx is clear.  Eyes:     General: No scleral icterus.       Right eye: No discharge.        Left eye: No discharge.     Extraocular Movements: Extraocular movements intact.     Conjunctiva/sclera: Conjunctivae normal.     Pupils: Pupils are equal, round, and reactive to light.  Neck:     Vascular: No JVD.  Cardiovascular:     Rate and Rhythm: Normal rate and regular rhythm.     Pulses: Normal pulses.          Radial pulses are 2+ on the right side and 2+ on the left side.     Heart sounds: Normal heart sounds.  Pulmonary:     Comments: Lungs clear to auscultation in all fields. Symmetric chest rise. No wheezing, rales, or rhonchi. Chest:     Chest wall: No tenderness.  Abdominal:     Comments: Abdomen is soft, non-distended, and non-tender in all quadrants. No rigidity, no guarding. No peritoneal signs.  Musculoskeletal:        General: Normal range of motion.     Cervical back: Normal range of motion.     Comments: Wearing compression stockings.  Mild lower extremity edema, nonpitting, baseline per patient.  Skin:    General: Skin  is warm and dry.     Capillary Refill: Capillary refill takes less than 2 seconds.  Neurological:     Mental Status: He is oriented to person, place, and  time.     GCS: GCS eye subscore is 4. GCS verbal subscore is 5. GCS motor subscore is 6.     Comments: Fluent speech, no facial droop.  Psychiatric:        Behavior: Behavior normal.     ED Results / Procedures / Treatments   Labs (all labs ordered are listed, but only abnormal results are displayed) Labs Reviewed  COMPREHENSIVE METABOLIC PANEL - Abnormal; Notable for the following components:      Result Value   Glucose, Bld 104 (*)    All other components within normal limits  CBC WITH DIFFERENTIAL/PLATELET - Abnormal; Notable for the following components:   MCV 101.8 (*)    All other components within normal limits  URINALYSIS, ROUTINE W REFLEX MICROSCOPIC - Abnormal; Notable for the following components:   APPearance HAZY (*)    All other components within normal limits  BRAIN NATRIURETIC PEPTIDE  TROPONIN I (HIGH SENSITIVITY)    EKG EKG Interpretation  Date/Time:  Sunday Sep 15 2020 10:09:44 EDT Ventricular Rate:  69 PR Interval:  168 QRS Duration: 102 QT Interval:  409 QTC Calculation: 439 R Axis:   -26 Text Interpretation: Sinus rhythm Borderline left axis deviation Abnormal R-wave progression, early transition Consider anterior infarct since last tracing no significant change Confirmed by Daleen Bo [phone]) on 09/15/2020 10:54:56 AM   Radiology CT ABDOMEN PELVIS WO CONTRAST  Result Date: 09/15/2020 CLINICAL DATA:  Mid abdominal pain with bilateral leg pain/edema. EXAM: CT ABDOMEN AND PELVIS WITHOUT CONTRAST TECHNIQUE: Multidetector CT imaging of the abdomen and pelvis was performed following the standard protocol without IV contrast. COMPARISON:  Multiple previous abdomen and pelvis CTs, most recent dated 03/07/2020. FINDINGS: Lower chest: Mild bronchiectasis with linear/reticular opacities consistent with atelectasis and/or scarring, all similar to the prior CT. No acute findings. Hepatobiliary: Liver normal in size. Multiple low-attenuation liver masses  consistent with cysts, stable. Gallbladder distended. Single 2.6 cm dependent stone, also stable. No wall thickening or adjacent inflammation. No bile duct dilation. Pancreas: Partial fatty replacement.  No mass or inflammation. Spleen: Normal in size without focal abnormality. Adrenals/Urinary Tract: No adrenal masses. Kidneys normal in orientation and position. Left greater than right renal cortical thinning. No renal masses. Stable nonobstructing stone in the left kidney lower pole. No other intrarenal stones. No hydronephrosis. Normal ureters. Small dependent stones versus wall calcifications along the right posterior aspect of the bladder. No bladder mass or wall thickening. Stomach/Bowel: Stomach is within normal limits. Appendix appears normal. No evidence of bowel wall thickening, distention, or inflammatory changes. Vascular/Lymphatic: Previous aortoiliac bypass surgery. 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 No evidence aortic leakage/rupture. No enlarged lymph nodes. Reproductive: Prostate with metallic markers, unchanged compared to the prior study. Other: No abdominal wall hernia or abnormality. No abdominopelvic ascites. Musculoskeletal: Stable changes from previous left hip arthrodesis. No acute fracture. No osteoblastic or osteolytic lesions. IMPRESSION: 1. No acute findings within the abdomen or pelvis. No change when compared to the prior exam. There are no findings to account for the patient's abdominal pain or leg pain/edema. Electronically Signed   By: Lajean Manes M.D.   On: 09/15/2020 11:15   DG Chest Portable 1 View  Result Date: 09/15/2020 CLINICAL DATA:  Abdominal tightness beginning 4 days ago with some shortness of breath. Lower  extremity edema for 4-5 months. EXAM: PORTABLE CHEST 1 VIEW COMPARISON:  09/04/2020 and older studies. FINDINGS: Stable changes from prior CABG surgery. Cardiac silhouette normal in size and configuration. No mediastinal hilar masses. Mild interstitial  prominence at the lung bases and linear scarring at the left lung base, stable from the prior radiograph. Remainder of the lungs is clear. No convincing pleural effusion and no pneumothorax. Skeletal structures grossly intact. IMPRESSION: No acute cardiopulmonary disease. Electronically Signed   By: Lajean Manes M.D.   On: 09/15/2020 11:05    Procedures Procedures   Medications Ordered in ED Medications - No data to display  ED Course  I have reviewed the triage vital signs and the nursing notes.  Pertinent labs & imaging results that were available during my care of the patient were reviewed by me and considered in my medical decision making (see chart for details).    MDM Rules/Calculators/A&P                          History provided by patient with additional history obtained from chart review.    Presenting with abdominal pain and chest pain.  Patient vaguely mentions chest pain has an ongoing for 4 months.  Doubt acute MI however will check EKG and troponin along with labs here for work-up of abdominal pain.  His abdominal exam is unremarkable.  No distention.  No peritoneal signs or tenderness.  Does have bilateral lower extremity edema that is nonpitting.  It is at his baseline per him. CBC and CMP overall unremarkable.  BNP is normal at 43.9.  First troponin is 4.  Doubt ACS or PE based on symptoms and exam today.  UA without signs of infection.  EKG without ischemic changes.  Chest x-ray without signs of acute infectious processes.  CT abdominal pelvis obtained and shows no acute findings.  Rectal exam performed by ED attending Dr. Eulis Foster without any fecal impaction.  Patient did have stool in the rectal vault.  Reassessed patient he is resting comfortably.  Discussed symptomatic care for constipation including MiraLAX and Colace.  Patient will be discharged home with plan to follow-up with PCP.  Strict return precautions were discussed. Discussed HPI, physical exam and plan of care  for this patient with attending Dr. Eulis Foster. The attending physician evaluated this patient as part of a shared visit and agrees with plan of care.    Portions of this note were generated with [employer]. Dictation errors may occur despite best attempts at proofreading.    Final Clinical Impression(s) / ED Diagnoses Final diagnoses:  Abdominal pain, unspecified abdominal location    Rx / DC Orders ED Discharge Orders    None       Lewanda Rife 09/15/20 1303    Daleen Bo, MD 09/15/20 1800

## 2020-09-15 NOTE — ED Triage Notes (Addendum)
Patient here from home reporting abd pain - tightness that started 3 days ago. Bilateral edema. Hx of same also reports hx of AAA. Denies n/v. Normally walks with walker.

## 2020-09-15 NOTE — Discharge Instructions (Addendum)
To help reduce constipation and promote bowel health, 1. Drink at least 64 ounces of water each day; 2. Eat plenty of fiber (fruits, vegetables, whole grains, legumes) 3. Get plenty of physical activity  If needed, you may also use daily or as needed, MiraLax (Osmotic Laxative) up to  1-2 times a day and Colace (Stool Softener aka Docusate) 100 mg up to twice a day to help with bowel movements. These medications are over the counter.  MiraLax is an Osmotic You may use other over-the-counter medications such as Dulcolax, Fleet enemas, magnesium citrate as needed for constipation. Please note that some of these medications may cause you to have abdominal cramping which is normal. If you develop severe abdominal pain, fever, vomiting, distention of your abdomen, unable to have a bowel movement for 5 days or are not passing gas, please return to the hospital.  Return to the Emergency Department for any fever, worsening pain, blood in stool, severe abdominal pain, or any other worsening or concerning symptoms.  

## 2020-09-15 NOTE — ED Provider Notes (Signed)
  Face-to-face evaluation   History: Patient presenting for crampy abdominal pain, intermittently, for 5 days associated with decreased stooling but he calls "constipation."  He denies vomiting, dizziness, fever, chills.  Physical exam: Alert elderly male.  He is calm and comfortable.  Abdomen slightly distended.  Abdomen is soft.  There is no palpable mass.  Anus is normal.  Small amount of brown stool in rectal vault.  No fecal impaction.  Medical screening examination/treatment/procedure(s) were conducted as a shared visit with non-physician practitioner(s) and myself.  I personally evaluated the patient during the encounter    Daleen Bo, MD 09/15/20 1800

## 2020-09-17 DIAGNOSIS — Z5189 Encounter for other specified aftercare: Secondary | ICD-10-CM | POA: Diagnosis not present

## 2020-11-13 DIAGNOSIS — E782 Mixed hyperlipidemia: Secondary | ICD-10-CM | POA: Diagnosis not present

## 2020-11-13 DIAGNOSIS — G629 Polyneuropathy, unspecified: Secondary | ICD-10-CM | POA: Diagnosis not present

## 2020-11-13 DIAGNOSIS — R609 Edema, unspecified: Secondary | ICD-10-CM | POA: Diagnosis not present

## 2020-11-13 DIAGNOSIS — J449 Chronic obstructive pulmonary disease, unspecified: Secondary | ICD-10-CM | POA: Diagnosis not present

## 2020-11-13 DIAGNOSIS — E1165 Type 2 diabetes mellitus with hyperglycemia: Secondary | ICD-10-CM | POA: Diagnosis not present

## 2020-11-13 DIAGNOSIS — Z87891 Personal history of nicotine dependence: Secondary | ICD-10-CM | POA: Diagnosis not present

## 2020-11-13 DIAGNOSIS — I10 Essential (primary) hypertension: Secondary | ICD-10-CM | POA: Diagnosis not present

## 2020-11-13 DIAGNOSIS — I2510 Atherosclerotic heart disease of native coronary artery without angina pectoris: Secondary | ICD-10-CM | POA: Diagnosis not present

## 2020-11-13 DIAGNOSIS — Z0001 Encounter for general adult medical examination with abnormal findings: Secondary | ICD-10-CM | POA: Diagnosis not present

## 2021-02-16 DIAGNOSIS — R6889 Other general symptoms and signs: Secondary | ICD-10-CM | POA: Diagnosis not present

## 2021-02-16 DIAGNOSIS — R0602 Shortness of breath: Secondary | ICD-10-CM | POA: Diagnosis not present

## 2021-02-16 DIAGNOSIS — I110 Hypertensive heart disease with heart failure: Secondary | ICD-10-CM | POA: Diagnosis not present

## 2021-02-16 DIAGNOSIS — R062 Wheezing: Secondary | ICD-10-CM | POA: Diagnosis not present

## 2021-02-16 DIAGNOSIS — R059 Cough, unspecified: Secondary | ICD-10-CM | POA: Diagnosis not present

## 2021-02-16 DIAGNOSIS — I7121 Aneurysm of the ascending aorta, without rupture: Secondary | ICD-10-CM | POA: Diagnosis not present

## 2021-02-16 DIAGNOSIS — I509 Heart failure, unspecified: Secondary | ICD-10-CM | POA: Diagnosis not present

## 2021-02-16 DIAGNOSIS — Z743 Need for continuous supervision: Secondary | ICD-10-CM | POA: Diagnosis not present

## 2021-02-16 DIAGNOSIS — R918 Other nonspecific abnormal finding of lung field: Secondary | ICD-10-CM | POA: Diagnosis not present

## 2021-02-16 DIAGNOSIS — T498X1A Poisoning by other topical agents, accidental (unintentional), initial encounter: Secondary | ICD-10-CM | POA: Diagnosis not present

## 2021-02-16 DIAGNOSIS — L243 Irritant contact dermatitis due to cosmetics: Secondary | ICD-10-CM | POA: Diagnosis not present

## 2021-02-16 DIAGNOSIS — R21 Rash and other nonspecific skin eruption: Secondary | ICD-10-CM | POA: Diagnosis not present

## 2021-02-16 DIAGNOSIS — Z951 Presence of aortocoronary bypass graft: Secondary | ICD-10-CM | POA: Diagnosis not present

## 2021-02-16 DIAGNOSIS — K7689 Other specified diseases of liver: Secondary | ICD-10-CM | POA: Diagnosis not present

## 2021-02-16 DIAGNOSIS — K8020 Calculus of gallbladder without cholecystitis without obstruction: Secondary | ICD-10-CM | POA: Diagnosis not present

## 2021-02-16 DIAGNOSIS — J439 Emphysema, unspecified: Secondary | ICD-10-CM | POA: Diagnosis not present

## 2021-02-19 DIAGNOSIS — I5023 Acute on chronic systolic (congestive) heart failure: Secondary | ICD-10-CM | POA: Diagnosis not present

## 2021-02-19 DIAGNOSIS — G459 Transient cerebral ischemic attack, unspecified: Secondary | ICD-10-CM | POA: Diagnosis not present

## 2021-02-19 DIAGNOSIS — J449 Chronic obstructive pulmonary disease, unspecified: Secondary | ICD-10-CM | POA: Diagnosis not present

## 2021-02-19 DIAGNOSIS — Z9889 Other specified postprocedural states: Secondary | ICD-10-CM | POA: Diagnosis not present

## 2021-02-19 DIAGNOSIS — K8020 Calculus of gallbladder without cholecystitis without obstruction: Secondary | ICD-10-CM | POA: Diagnosis not present

## 2021-02-19 DIAGNOSIS — Z8679 Personal history of other diseases of the circulatory system: Secondary | ICD-10-CM | POA: Diagnosis not present

## 2021-02-19 DIAGNOSIS — I25709 Atherosclerosis of coronary artery bypass graft(s), unspecified, with unspecified angina pectoris: Secondary | ICD-10-CM | POA: Diagnosis not present

## 2021-03-07 ENCOUNTER — Other Ambulatory Visit: Payer: Self-pay

## 2021-03-07 ENCOUNTER — Inpatient Hospital Stay (HOSPITAL_COMMUNITY)
Admission: EM | Admit: 2021-03-07 | Discharge: 2021-03-13 | DRG: 177 | Disposition: A | Discharge status: Home Health | Payer: Medicare Other | Attending: Internal Medicine | Admitting: Internal Medicine

## 2021-03-07 ENCOUNTER — Emergency Department (HOSPITAL_COMMUNITY): Payer: Medicare Other

## 2021-03-07 DIAGNOSIS — I252 Old myocardial infarction: Secondary | ICD-10-CM

## 2021-03-07 DIAGNOSIS — M810 Age-related osteoporosis without current pathological fracture: Secondary | ICD-10-CM | POA: Diagnosis not present

## 2021-03-07 DIAGNOSIS — Z79899 Other long term (current) drug therapy: Secondary | ICD-10-CM | POA: Diagnosis not present

## 2021-03-07 DIAGNOSIS — M7989 Other specified soft tissue disorders: Secondary | ICD-10-CM | POA: Diagnosis not present

## 2021-03-07 DIAGNOSIS — Z8673 Personal history of transient ischemic attack (TIA), and cerebral infarction without residual deficits: Secondary | ICD-10-CM

## 2021-03-07 DIAGNOSIS — J441 Chronic obstructive pulmonary disease with (acute) exacerbation: Secondary | ICD-10-CM | POA: Diagnosis not present

## 2021-03-07 DIAGNOSIS — Z95828 Presence of other vascular implants and grafts: Secondary | ICD-10-CM | POA: Diagnosis not present

## 2021-03-07 DIAGNOSIS — R7303 Prediabetes: Secondary | ICD-10-CM | POA: Diagnosis not present

## 2021-03-07 DIAGNOSIS — R739 Hyperglycemia, unspecified: Secondary | ICD-10-CM | POA: Diagnosis not present

## 2021-03-07 DIAGNOSIS — L89151 Pressure ulcer of sacral region, stage 1: Secondary | ICD-10-CM | POA: Diagnosis present

## 2021-03-07 DIAGNOSIS — I25110 Atherosclerotic heart disease of native coronary artery with unstable angina pectoris: Secondary | ICD-10-CM | POA: Diagnosis not present

## 2021-03-07 DIAGNOSIS — R29898 Other symptoms and signs involving the musculoskeletal system: Secondary | ICD-10-CM | POA: Diagnosis not present

## 2021-03-07 DIAGNOSIS — E871 Hypo-osmolality and hyponatremia: Secondary | ICD-10-CM | POA: Diagnosis present

## 2021-03-07 DIAGNOSIS — J9601 Acute respiratory failure with hypoxia: Secondary | ICD-10-CM | POA: Diagnosis not present

## 2021-03-07 DIAGNOSIS — Z8249 Family history of ischemic heart disease and other diseases of the circulatory system: Secondary | ICD-10-CM

## 2021-03-07 DIAGNOSIS — I6522 Occlusion and stenosis of left carotid artery: Secondary | ICD-10-CM | POA: Diagnosis not present

## 2021-03-07 DIAGNOSIS — E878 Other disorders of electrolyte and fluid balance, not elsewhere classified: Secondary | ICD-10-CM | POA: Diagnosis present

## 2021-03-07 DIAGNOSIS — I739 Peripheral vascular disease, unspecified: Secondary | ICD-10-CM | POA: Diagnosis present

## 2021-03-07 DIAGNOSIS — J9811 Atelectasis: Secondary | ICD-10-CM | POA: Diagnosis not present

## 2021-03-07 DIAGNOSIS — R531 Weakness: Secondary | ICD-10-CM

## 2021-03-07 DIAGNOSIS — I6529 Occlusion and stenosis of unspecified carotid artery: Secondary | ICD-10-CM | POA: Diagnosis present

## 2021-03-07 DIAGNOSIS — R5381 Other malaise: Secondary | ICD-10-CM | POA: Diagnosis present

## 2021-03-07 DIAGNOSIS — N401 Enlarged prostate with lower urinary tract symptoms: Secondary | ICD-10-CM | POA: Diagnosis present

## 2021-03-07 DIAGNOSIS — I110 Hypertensive heart disease with heart failure: Secondary | ICD-10-CM | POA: Diagnosis present

## 2021-03-07 DIAGNOSIS — G459 Transient cerebral ischemic attack, unspecified: Secondary | ICD-10-CM | POA: Diagnosis present

## 2021-03-07 DIAGNOSIS — N179 Acute kidney failure, unspecified: Secondary | ICD-10-CM

## 2021-03-07 DIAGNOSIS — M25462 Effusion, left knee: Secondary | ICD-10-CM

## 2021-03-07 DIAGNOSIS — U071 COVID-19: Principal | ICD-10-CM

## 2021-03-07 DIAGNOSIS — I255 Ischemic cardiomyopathy: Secondary | ICD-10-CM | POA: Diagnosis present

## 2021-03-07 DIAGNOSIS — Z87891 Personal history of nicotine dependence: Secondary | ICD-10-CM | POA: Diagnosis not present

## 2021-03-07 DIAGNOSIS — Z7982 Long term (current) use of aspirin: Secondary | ICD-10-CM

## 2021-03-07 DIAGNOSIS — K219 Gastro-esophageal reflux disease without esophagitis: Secondary | ICD-10-CM | POA: Diagnosis not present

## 2021-03-07 DIAGNOSIS — Z7984 Long term (current) use of oral hypoglycemic drugs: Secondary | ICD-10-CM

## 2021-03-07 DIAGNOSIS — I5032 Chronic diastolic (congestive) heart failure: Secondary | ICD-10-CM | POA: Diagnosis not present

## 2021-03-07 DIAGNOSIS — R600 Localized edema: Secondary | ICD-10-CM | POA: Diagnosis not present

## 2021-03-07 DIAGNOSIS — Z7951 Long term (current) use of inhaled steroids: Secondary | ICD-10-CM

## 2021-03-07 DIAGNOSIS — E785 Hyperlipidemia, unspecified: Secondary | ICD-10-CM | POA: Diagnosis present

## 2021-03-07 DIAGNOSIS — Z951 Presence of aortocoronary bypass graft: Secondary | ICD-10-CM

## 2021-03-07 DIAGNOSIS — I5021 Acute systolic (congestive) heart failure: Secondary | ICD-10-CM | POA: Diagnosis not present

## 2021-03-07 DIAGNOSIS — Z8679 Personal history of other diseases of the circulatory system: Secondary | ICD-10-CM

## 2021-03-07 DIAGNOSIS — M79604 Pain in right leg: Secondary | ICD-10-CM | POA: Diagnosis not present

## 2021-03-07 DIAGNOSIS — I10 Essential (primary) hypertension: Secondary | ICD-10-CM | POA: Diagnosis not present

## 2021-03-07 DIAGNOSIS — Z28310 Unvaccinated for covid-19: Secondary | ICD-10-CM

## 2021-03-07 DIAGNOSIS — Z833 Family history of diabetes mellitus: Secondary | ICD-10-CM

## 2021-03-07 DIAGNOSIS — I2510 Atherosclerotic heart disease of native coronary artery without angina pectoris: Secondary | ICD-10-CM | POA: Diagnosis not present

## 2021-03-07 DIAGNOSIS — W19XXXA Unspecified fall, initial encounter: Secondary | ICD-10-CM | POA: Diagnosis present

## 2021-03-07 DIAGNOSIS — J449 Chronic obstructive pulmonary disease, unspecified: Secondary | ICD-10-CM | POA: Diagnosis not present

## 2021-03-07 DIAGNOSIS — I5030 Unspecified diastolic (congestive) heart failure: Secondary | ICD-10-CM | POA: Diagnosis not present

## 2021-03-07 DIAGNOSIS — J1282 Pneumonia due to coronavirus disease 2019: Secondary | ICD-10-CM | POA: Diagnosis not present

## 2021-03-07 DIAGNOSIS — G894 Chronic pain syndrome: Secondary | ICD-10-CM | POA: Diagnosis present

## 2021-03-07 DIAGNOSIS — R338 Other retention of urine: Secondary | ICD-10-CM | POA: Diagnosis not present

## 2021-03-07 DIAGNOSIS — N400 Enlarged prostate without lower urinary tract symptoms: Secondary | ICD-10-CM | POA: Diagnosis present

## 2021-03-07 DIAGNOSIS — M25562 Pain in left knee: Secondary | ICD-10-CM

## 2021-03-07 DIAGNOSIS — R0602 Shortness of breath: Secondary | ICD-10-CM

## 2021-03-07 DIAGNOSIS — Z743 Need for continuous supervision: Secondary | ICD-10-CM | POA: Diagnosis not present

## 2021-03-07 DIAGNOSIS — R609 Edema, unspecified: Secondary | ICD-10-CM | POA: Diagnosis not present

## 2021-03-07 DIAGNOSIS — L03116 Cellulitis of left lower limb: Secondary | ICD-10-CM | POA: Diagnosis not present

## 2021-03-07 DIAGNOSIS — I119 Hypertensive heart disease without heart failure: Secondary | ICD-10-CM | POA: Diagnosis present

## 2021-03-07 DIAGNOSIS — I5020 Unspecified systolic (congestive) heart failure: Secondary | ICD-10-CM

## 2021-03-07 DIAGNOSIS — J440 Chronic obstructive pulmonary disease with acute lower respiratory infection: Secondary | ICD-10-CM | POA: Diagnosis not present

## 2021-03-07 DIAGNOSIS — M79605 Pain in left leg: Secondary | ICD-10-CM | POA: Diagnosis not present

## 2021-03-07 LAB — COMPREHENSIVE METABOLIC PANEL
ALT: 30 U/L (ref 0–44)
AST: 53 U/L — ABNORMAL HIGH (ref 15–41)
Albumin: 2.8 g/dL — ABNORMAL LOW (ref 3.5–5.0)
Alkaline Phosphatase: 82 U/L (ref 38–126)
Anion gap: 14 (ref 5–15)
BUN: 13 mg/dL (ref 8–23)
CO2: 22 mmol/L (ref 22–32)
Calcium: 8.2 mg/dL — ABNORMAL LOW (ref 8.9–10.3)
Chloride: 96 mmol/L — ABNORMAL LOW (ref 98–111)
Creatinine, Ser: 1.34 mg/dL — ABNORMAL HIGH (ref 0.61–1.24)
GFR, Estimated: 57 mL/min — ABNORMAL LOW (ref >60)
Glucose, Bld: 127 mg/dL — ABNORMAL HIGH (ref 70–99)
Potassium: 3.9 mmol/L (ref 3.5–5.1)
Sodium: 132 mmol/L — ABNORMAL LOW (ref 135–145)
Total Bilirubin: 0.9 mg/dL (ref 0.3–1.2)
Total Protein: 6.1 g/dL — ABNORMAL LOW (ref 6.5–8.1)

## 2021-03-07 LAB — CBC WITH DIFFERENTIAL/PLATELET
Abs Immature Granulocytes: 0.03 10*3/uL (ref 0.00–0.07)
Basophils Absolute: 0 10*3/uL (ref 0.0–0.1)
Basophils Relative: 0 %
Eosinophils Absolute: 0 10*3/uL (ref 0.0–0.5)
Eosinophils Relative: 0 %
HCT: 52.9 % — ABNORMAL HIGH (ref 39.0–52.0)
Hemoglobin: 16.7 g/dL (ref 13.0–17.0)
Immature Granulocytes: 1 %
Lymphocytes Relative: 20 %
Lymphs Abs: 1.3 10*3/uL (ref 0.7–4.0)
MCH: 32.2 pg (ref 26.0–34.0)
MCHC: 31.6 g/dL (ref 30.0–36.0)
MCV: 102.1 fL — ABNORMAL HIGH (ref 80.0–100.0)
Monocytes Absolute: 0.6 10*3/uL (ref 0.1–1.0)
Monocytes Relative: 10 %
Neutro Abs: 4.4 10*3/uL (ref 1.7–7.7)
Neutrophils Relative %: 69 %
Platelets: 130 10*3/uL — ABNORMAL LOW (ref 150–400)
RBC: 5.18 MIL/uL (ref 4.22–5.81)
RDW: 12.8 % (ref 11.5–15.5)
WBC: 6.3 10*3/uL (ref 4.0–10.5)
nRBC: 0 % (ref 0.0–0.2)

## 2021-03-07 LAB — RESP PANEL BY RT-PCR (FLU A&B, COVID) ARPGX2
Influenza A by PCR: NEGATIVE
Influenza B by PCR: NEGATIVE
SARS Coronavirus 2 by RT PCR: POSITIVE — AB

## 2021-03-07 NOTE — ED Notes (Signed)
Patient moved to appropriate waiting area

## 2021-03-07 NOTE — ED Triage Notes (Signed)
Arrived via EMS; endorsed diagnosis w/ Covid 3 days ago and been having weakness since then. Reported hx of weakness and has + crackles. Reported fall last night d/t bilateral leg weakness.

## 2021-03-07 NOTE — ED Provider Notes (Signed)
Emergency Medicine Provider Triage Evaluation Note  Chris Payne , a 70 y.o. male  was evaluated in triage.  Pt complains of lysed weakness for the past 3 days.  Patient reports he had an at home COVID test on 3 days ago and it was positive.  He is unvaccinated.  He complains of bilateral leg pain and weakness.  He states this caused him to fall yesterday.  He has some back pain related to same.  Per EMS he is noted to have crackles on exam.  Patient complains of some shortness of breath.  He quit smoking approximately 10 years ago.  He denies any chest pain.  He denies any head injury with fall.  Review of Systems  Positive: + weakness, SOB, bilateral leg pain, back pain Negative: - fevers  Physical Exam  BP 125/81 (BP Location: Left Arm)   Pulse 93   Temp 98.7 F (37.1 C) (Oral)   Resp (!) 22   SpO2 97%  Gen:   Awake, no distress   Resp:  Normal effort. Audible crackles without auscultation. Satting 97% on RA.  MSK:   Moves extremities without difficulty  Other:    Medical Decision Making  Medically screening exam initiated at 4:17 PM.  Appropriate orders placed.  [name] was informed that the remainder of the evaluation will be completed by another provider, this initial triage assessment does not replace that evaluation, and the importance of remaining in the ED until their evaluation is complete.     Eustaquio Maize, PA-C 03/07/21 1618    Charlesetta Shanks, MD 03/08/21 [phone]

## 2021-03-08 ENCOUNTER — Inpatient Hospital Stay (HOSPITAL_COMMUNITY): Payer: Medicare Other

## 2021-03-08 ENCOUNTER — Encounter (HOSPITAL_COMMUNITY): Payer: Self-pay | Admitting: Student in an Organized Health Care Education/Training Program

## 2021-03-08 DIAGNOSIS — E785 Hyperlipidemia, unspecified: Secondary | ICD-10-CM

## 2021-03-08 DIAGNOSIS — M79605 Pain in left leg: Secondary | ICD-10-CM | POA: Diagnosis not present

## 2021-03-08 DIAGNOSIS — J440 Chronic obstructive pulmonary disease with acute lower respiratory infection: Secondary | ICD-10-CM | POA: Diagnosis not present

## 2021-03-08 DIAGNOSIS — I255 Ischemic cardiomyopathy: Secondary | ICD-10-CM | POA: Diagnosis present

## 2021-03-08 DIAGNOSIS — R338 Other retention of urine: Secondary | ICD-10-CM | POA: Diagnosis present

## 2021-03-08 DIAGNOSIS — J449 Chronic obstructive pulmonary disease, unspecified: Secondary | ICD-10-CM | POA: Diagnosis present

## 2021-03-08 DIAGNOSIS — I739 Peripheral vascular disease, unspecified: Secondary | ICD-10-CM

## 2021-03-08 DIAGNOSIS — M7989 Other specified soft tissue disorders: Secondary | ICD-10-CM | POA: Diagnosis present

## 2021-03-08 DIAGNOSIS — K219 Gastro-esophageal reflux disease without esophagitis: Secondary | ICD-10-CM | POA: Diagnosis present

## 2021-03-08 DIAGNOSIS — M25462 Effusion, left knee: Secondary | ICD-10-CM | POA: Diagnosis present

## 2021-03-08 DIAGNOSIS — E871 Hypo-osmolality and hyponatremia: Secondary | ICD-10-CM | POA: Diagnosis present

## 2021-03-08 DIAGNOSIS — N401 Enlarged prostate with lower urinary tract symptoms: Secondary | ICD-10-CM | POA: Diagnosis present

## 2021-03-08 DIAGNOSIS — R0602 Shortness of breath: Secondary | ICD-10-CM | POA: Insufficient documentation

## 2021-03-08 DIAGNOSIS — Z79899 Other long term (current) drug therapy: Secondary | ICD-10-CM

## 2021-03-08 DIAGNOSIS — R609 Edema, unspecified: Secondary | ICD-10-CM

## 2021-03-08 DIAGNOSIS — I110 Hypertensive heart disease with heart failure: Secondary | ICD-10-CM | POA: Diagnosis present

## 2021-03-08 DIAGNOSIS — E878 Other disorders of electrolyte and fluid balance, not elsewhere classified: Secondary | ICD-10-CM | POA: Diagnosis present

## 2021-03-08 DIAGNOSIS — I5032 Chronic diastolic (congestive) heart failure: Secondary | ICD-10-CM | POA: Diagnosis present

## 2021-03-08 DIAGNOSIS — U071 COVID-19: Secondary | ICD-10-CM | POA: Diagnosis not present

## 2021-03-08 DIAGNOSIS — R739 Hyperglycemia, unspecified: Secondary | ICD-10-CM | POA: Diagnosis not present

## 2021-03-08 DIAGNOSIS — I5021 Acute systolic (congestive) heart failure: Secondary | ICD-10-CM

## 2021-03-08 DIAGNOSIS — I6522 Occlusion and stenosis of left carotid artery: Secondary | ICD-10-CM | POA: Diagnosis present

## 2021-03-08 DIAGNOSIS — L03116 Cellulitis of left lower limb: Secondary | ICD-10-CM | POA: Diagnosis present

## 2021-03-08 DIAGNOSIS — W19XXXA Unspecified fall, initial encounter: Secondary | ICD-10-CM | POA: Diagnosis present

## 2021-03-08 DIAGNOSIS — M79604 Pain in right leg: Secondary | ICD-10-CM | POA: Diagnosis not present

## 2021-03-08 DIAGNOSIS — Z87891 Personal history of nicotine dependence: Secondary | ICD-10-CM

## 2021-03-08 DIAGNOSIS — R7303 Prediabetes: Secondary | ICD-10-CM | POA: Diagnosis present

## 2021-03-08 DIAGNOSIS — M810 Age-related osteoporosis without current pathological fracture: Secondary | ICD-10-CM | POA: Diagnosis present

## 2021-03-08 DIAGNOSIS — J1282 Pneumonia due to coronavirus disease 2019: Secondary | ICD-10-CM | POA: Diagnosis present

## 2021-03-08 DIAGNOSIS — I2510 Atherosclerotic heart disease of native coronary artery without angina pectoris: Secondary | ICD-10-CM

## 2021-03-08 DIAGNOSIS — N179 Acute kidney failure, unspecified: Secondary | ICD-10-CM | POA: Diagnosis present

## 2021-03-08 DIAGNOSIS — I5020 Unspecified systolic (congestive) heart failure: Secondary | ICD-10-CM | POA: Insufficient documentation

## 2021-03-08 DIAGNOSIS — J9601 Acute respiratory failure with hypoxia: Secondary | ICD-10-CM | POA: Diagnosis present

## 2021-03-08 DIAGNOSIS — R5381 Other malaise: Secondary | ICD-10-CM | POA: Diagnosis present

## 2021-03-08 DIAGNOSIS — I10 Essential (primary) hypertension: Secondary | ICD-10-CM

## 2021-03-08 DIAGNOSIS — Z95828 Presence of other vascular implants and grafts: Secondary | ICD-10-CM

## 2021-03-08 DIAGNOSIS — L89151 Pressure ulcer of sacral region, stage 1: Secondary | ICD-10-CM | POA: Diagnosis present

## 2021-03-08 DIAGNOSIS — Z7982 Long term (current) use of aspirin: Secondary | ICD-10-CM

## 2021-03-08 DIAGNOSIS — R600 Localized edema: Secondary | ICD-10-CM | POA: Diagnosis not present

## 2021-03-08 LAB — HEMOGLOBIN A1C
Hgb A1c MFr Bld: 6.1 % — ABNORMAL HIGH (ref 4.8–5.6)
Mean Plasma Glucose: 128.37 mg/dL

## 2021-03-08 LAB — CBC WITH DIFFERENTIAL/PLATELET
Abs Immature Granulocytes: 0.02 10*3/uL (ref 0.00–0.07)
Basophils Absolute: 0 10*3/uL (ref 0.0–0.1)
Basophils Relative: 0 %
Eosinophils Absolute: 0 10*3/uL (ref 0.0–0.5)
Eosinophils Relative: 0 %
HCT: 51.5 % (ref 39.0–52.0)
Hemoglobin: 17.6 g/dL — ABNORMAL HIGH (ref 13.0–17.0)
Immature Granulocytes: 0 %
Lymphocytes Relative: 15 %
Lymphs Abs: 0.7 10*3/uL (ref 0.7–4.0)
MCH: 33.1 pg (ref 26.0–34.0)
MCHC: 34.2 g/dL (ref 30.0–36.0)
MCV: 96.8 fL (ref 80.0–100.0)
Monocytes Absolute: 0.3 10*3/uL (ref 0.1–1.0)
Monocytes Relative: 6 %
Neutro Abs: 3.8 10*3/uL (ref 1.7–7.7)
Neutrophils Relative %: 79 %
Platelets: 104 10*3/uL — ABNORMAL LOW (ref 150–400)
RBC: 5.32 MIL/uL (ref 4.22–5.81)
RDW: 12.7 % (ref 11.5–15.5)
WBC: 4.8 10*3/uL (ref 4.0–10.5)
nRBC: 0 % (ref 0.0–0.2)

## 2021-03-08 LAB — COMPREHENSIVE METABOLIC PANEL
ALT: 32 U/L (ref 0–44)
AST: 49 U/L — ABNORMAL HIGH (ref 15–41)
Albumin: 2.6 g/dL — ABNORMAL LOW (ref 3.5–5.0)
Alkaline Phosphatase: 80 U/L (ref 38–126)
Anion gap: 12 (ref 5–15)
BUN: 22 mg/dL (ref 8–23)
CO2: 23 mmol/L (ref 22–32)
Calcium: 8.3 mg/dL — ABNORMAL LOW (ref 8.9–10.3)
Chloride: 99 mmol/L (ref 98–111)
Creatinine, Ser: 1.71 mg/dL — ABNORMAL HIGH (ref 0.61–1.24)
GFR, Estimated: 42 mL/min — ABNORMAL LOW (ref >60)
Glucose, Bld: 114 mg/dL — ABNORMAL HIGH (ref 70–99)
Potassium: 3.6 mmol/L (ref 3.5–5.1)
Sodium: 134 mmol/L — ABNORMAL LOW (ref 135–145)
Total Bilirubin: 0.6 mg/dL (ref 0.3–1.2)
Total Protein: 6 g/dL — ABNORMAL LOW (ref 6.5–8.1)

## 2021-03-08 LAB — GLUCOSE, CAPILLARY
Glucose-Capillary: 194 mg/dL — ABNORMAL HIGH (ref 70–99)
Glucose-Capillary: 174 mg/dL — ABNORMAL HIGH (ref 70–99)

## 2021-03-08 LAB — ECHOCARDIOGRAM COMPLETE
AR max vel: 2.95 cm2
AV Area VTI: 2.57 cm2
AV Area mean vel: 3.05 cm2
AV Mean grad: 1 mmHg
AV Peak grad: 2.7 mmHg
Ao pk vel: 0.82 m/s
Area-P 1/2: 2.87 cm2
Height: 67 in
MV VTI: 2.82 cm2
S' Lateral: 3 cm
Weight: 2400 oz

## 2021-03-08 LAB — C-REACTIVE PROTEIN: CRP: 11.9 mg/dL — ABNORMAL HIGH (ref <1.0)

## 2021-03-08 LAB — APTT: aPTT: 37 seconds — ABNORMAL HIGH (ref 24–36)

## 2021-03-08 LAB — PHOSPHORUS: Phosphorus: 1.6 mg/dL — ABNORMAL LOW (ref 2.5–4.6)

## 2021-03-08 LAB — FERRITIN: Ferritin: 473 ng/mL — ABNORMAL HIGH (ref 24–336)

## 2021-03-08 LAB — CBG MONITORING, ED
Glucose-Capillary: 115 mg/dL — ABNORMAL HIGH (ref 70–99)
Glucose-Capillary: 114 mg/dL — ABNORMAL HIGH (ref 70–99)

## 2021-03-08 LAB — PROTIME-INR
INR: 1.1 (ref 0.8–1.2)
Prothrombin Time: 13.8 seconds (ref 11.4–15.2)

## 2021-03-08 LAB — FIBRINOGEN: Fibrinogen: 473 mg/dL (ref 210–475)

## 2021-03-08 LAB — D-DIMER, QUANTITATIVE: D-Dimer, Quant: 3.98 ug/mL-FEU — ABNORMAL HIGH (ref 0.00–0.50)

## 2021-03-08 LAB — MAGNESIUM: Magnesium: 2 mg/dL (ref 1.7–2.4)

## 2021-03-08 LAB — BRAIN NATRIURETIC PEPTIDE: B Natriuretic Peptide: 86.1 pg/mL (ref 0.0–100.0)

## 2021-03-08 MED ORDER — ALBUTEROL SULFATE (2.5 MG/3ML) 0.083% IN NEBU: 3.0000 mL | INHALATION_SOLUTION | Freq: Four times a day (QID) | RESPIRATORY_TRACT | Status: DC | PRN

## 2021-03-08 MED ORDER — ENOXAPARIN SODIUM 40 MG/0.4ML IJ SOSY
40.0000 mg | PREFILLED_SYRINGE | INTRAMUSCULAR | Status: DC
  Administered 2021-03-08: 40 mg via SUBCUTANEOUS
  Filled 2021-03-08: qty 0.4

## 2021-03-08 MED ORDER — SODIUM CHLORIDE 0.9% FLUSH
3.0000 mL | Freq: Two times a day (BID) | INTRAVENOUS | Status: DC
  Administered 2021-03-08 – 2021-03-13 (×10): 3 mL via INTRAVENOUS

## 2021-03-08 MED ORDER — ALBUTEROL SULFATE HFA 108 (90 BASE) MCG/ACT IN AERS
1.0000 | INHALATION_SPRAY | RESPIRATORY_TRACT | Status: DC | PRN
  Filled 2021-03-08: qty 6.7

## 2021-03-08 MED ORDER — TAMSULOSIN HCL 0.4 MG PO CAPS
0.4000 mg | ORAL_CAPSULE | Freq: Every day | ORAL | Status: DC
  Administered 2021-03-08 – 2021-03-13 (×6): 0.4 mg via ORAL
  Filled 2021-03-08 (×6): qty 1

## 2021-03-08 MED ORDER — SODIUM CHLORIDE 0.9 % IV SOLN
100.0000 mg | Freq: Every day | INTRAVENOUS | Status: DC
  Filled 2021-03-08: qty 20

## 2021-03-08 MED ORDER — SODIUM CHLORIDE 0.9 % IV SOLN
200.0000 mg | Freq: Once | INTRAVENOUS | Status: AC
  Administered 2021-03-08: 200 mg via INTRAVENOUS
  Filled 2021-03-08: qty 40

## 2021-03-08 MED ORDER — DEXAMETHASONE 6 MG PO TABS
6.0000 mg | ORAL_TABLET | ORAL | Status: DC
  Administered 2021-03-09 – 2021-03-13 (×5): 6 mg via ORAL
  Filled 2021-03-08 (×6): qty 1

## 2021-03-08 MED ORDER — POTASSIUM PHOSPHATES 15 MMOLE/5ML IV SOLN
15.0000 mmol | Freq: Once | INTRAVENOUS | Status: AC
  Administered 2021-03-08: 15 mmol via INTRAVENOUS
  Filled 2021-03-08: qty 5

## 2021-03-08 MED ORDER — HEPARIN (PORCINE) 25000 UT/250ML-% IV SOLN
1150.0000 [IU]/h | INTRAVENOUS | Status: DC
  Administered 2021-03-08: 1150 [IU]/h via INTRAVENOUS
  Filled 2021-03-08: qty 250

## 2021-03-08 MED ORDER — GABAPENTIN 300 MG PO CAPS
300.0000 mg | ORAL_CAPSULE | Freq: Two times a day (BID) | ORAL | Status: DC
  Administered 2021-03-08 – 2021-03-13 (×11): 300 mg via ORAL
  Filled 2021-03-08 (×11): qty 1

## 2021-03-08 MED ORDER — PERFLUTREN LIPID MICROSPHERE
1.0000 mL | INTRAVENOUS | Status: AC | PRN
  Administered 2021-03-08: 4 mL via INTRAVENOUS
  Filled 2021-03-08: qty 10

## 2021-03-08 MED ORDER — ACETAMINOPHEN 650 MG RE SUPP: 650.0000 mg | Freq: Four times a day (QID) | RECTAL | Status: DC | PRN

## 2021-03-08 MED ORDER — INSULIN ASPART 100 UNIT/ML IJ SOLN
0.0000 [IU] | INTRAMUSCULAR | Status: DC
  Administered 2021-03-08 – 2021-03-09 (×6): 2 [IU] via SUBCUTANEOUS
  Administered 2021-03-09: 5 [IU] via SUBCUTANEOUS
  Administered 2021-03-10: 3 [IU] via SUBCUTANEOUS
  Administered 2021-03-10 (×2): 2 [IU] via SUBCUTANEOUS

## 2021-03-08 MED ORDER — ASPIRIN EC 81 MG PO TBEC
81.0000 mg | DELAYED_RELEASE_TABLET | Freq: Every day | ORAL | Status: DC
  Administered 2021-03-08 – 2021-03-13 (×6): 81 mg via ORAL
  Filled 2021-03-08 (×6): qty 1

## 2021-03-08 MED ORDER — POLYETHYLENE GLYCOL 3350 17 G PO PACK: 17.0000 g | PACK | Freq: Every day | ORAL | Status: DC | PRN

## 2021-03-08 MED ORDER — HEPARIN BOLUS VIA INFUSION
4500.0000 [IU] | Freq: Once | INTRAVENOUS | Status: AC
  Administered 2021-03-08: 4500 [IU] via INTRAVENOUS
  Filled 2021-03-08: qty 4500

## 2021-03-08 MED ORDER — SODIUM CHLORIDE 0.9 % IV BOLUS
500.0000 mL | Freq: Once | INTRAVENOUS | Status: AC
  Administered 2021-03-08: 500 mL via INTRAVENOUS

## 2021-03-08 MED ORDER — UMECLIDINIUM BROMIDE 62.5 MCG/ACT IN AEPB
1.0000 | INHALATION_SPRAY | Freq: Every day | RESPIRATORY_TRACT | Status: DC
  Administered 2021-03-08 – 2021-03-13 (×6): 1 via RESPIRATORY_TRACT
  Filled 2021-03-08: qty 7

## 2021-03-08 MED ORDER — ALBUTEROL SULFATE HFA 108 (90 BASE) MCG/ACT IN AERS
4.0000 | INHALATION_SPRAY | Freq: Once | RESPIRATORY_TRACT | Status: AC
  Administered 2021-03-08: 4 via RESPIRATORY_TRACT
  Filled 2021-03-08: qty 6.7

## 2021-03-08 MED ORDER — MOMETASONE FURO-FORMOTEROL FUM 200-5 MCG/ACT IN AERO
2.0000 | INHALATION_SPRAY | Freq: Two times a day (BID) | RESPIRATORY_TRACT | Status: DC
  Administered 2021-03-08 – 2021-03-13 (×11): 2 via RESPIRATORY_TRACT
  Filled 2021-03-08: qty 8.8

## 2021-03-08 MED ORDER — ENOXAPARIN SODIUM 40 MG/0.4ML IJ SOSY: 40.0000 mg | PREFILLED_SYRINGE | INTRAMUSCULAR | Status: DC

## 2021-03-08 MED ORDER — METHYLPREDNISOLONE SODIUM SUCC 125 MG IJ SOLR
125.0000 mg | Freq: Once | INTRAMUSCULAR | Status: AC
  Administered 2021-03-08: 125 mg via INTRAVENOUS
  Filled 2021-03-08: qty 2

## 2021-03-08 MED ORDER — ACETAMINOPHEN 325 MG PO TABS
650.0000 mg | ORAL_TABLET | Freq: Four times a day (QID) | ORAL | Status: DC | PRN
  Administered 2021-03-09 – 2021-03-10 (×3): 650 mg via ORAL
  Filled 2021-03-08 (×3): qty 2

## 2021-03-08 MED ORDER — PANTOPRAZOLE SODIUM 20 MG PO TBEC
20.0000 mg | DELAYED_RELEASE_TABLET | Freq: Every day | ORAL | Status: DC
  Administered 2021-03-09 – 2021-03-13 (×5): 20 mg via ORAL
  Filled 2021-03-08 (×6): qty 1

## 2021-03-08 NOTE — ED Provider Notes (Signed)
[facility] EMERGENCY DEPARTMENT Provider Note   CSN: [id number] Arrival date & time: 03/07/21  1606     History Chief Complaint  Patient presents with   Weakness    Chris Payne is a 70 y.o. male.  Chris Payne is a 70 y.o. male with a history of hypertension, hyperlipidemia, GERD, COPD, CHF, CAD, AAA, stroke, who presents to the emergency department for evaluation of generalized weakness.  Patient reports that he had a positive home COVID test 3 days ago and since then he has been feeling progressively worse.  He is not vaccinated for COVID.  Reports he has been feeling more and more generally weak over the past 3 days, 2 days ago did have a fall onto his bottom from this weakness reports some pain in his legs generally but no focal pain from the fall.  Did not hit his head.  EMS reported crackles on lung exam, he does not wear any oxygen at baseline.  Does report some shortness of breath and cough, prior smoking history but has not smoked in 10 years.  Denies any associated chest pain.  No abdominal pain, nausea, vomiting or diarrhea but does report that he has had very little appetite since becoming ill.  Denies medications to treat symptoms prior to arrival.  No other aggravating or alleviating factors.  The history is provided by the patient and medical records.      Past Medical History:  Diagnosis Date   AAA (abdominal aortic aneurysm) ([place])    s/p repair 6/11   CAD (coronary artery disease)    s/p CABG 2/12:  LIMA to LAD, SVG to diagonal-1, SVG to ramus intermedius,, SVG to AM (Dr. Roxan Hockey) ;  b.  Myoview 10/13:  inf infarct with very mild peri-infarct ischemia, EF 49%, inf HK; c  He has severe three-vessel native coronary artery disease. Catheterization March 2015 as above   CAP (community acquired pneumonia) 02/23/2014   Carotid stenosis    a.  dopplers 4/09: LICA 81-19%;  b. Carotid dopplers 3/14:  R 0-39%, L 60-79%, repeat 6 mos   CHF  (congestive heart failure) (HCC)    COPD (chronic obstructive pulmonary disease) (HCC)    GERD (gastroesophageal reflux disease)    Headache(784.0)    Hyperlipidemia    Hypertension    Hypoxia 02/23/2014   Inguinal hernia    Myocardial infarction [facility])    PCI x3   PAD (peripheral artery disease) ([place])    ABIs 4/12:  R 0.88, L 0.92; R SFA 40%, L CFA 50%, L SFA 50-60%   Pneumonia    hx   Stroke [facility])    h/o pontine CVA   Thyroid nodule    incidental finding on carotid doppler 3/14 => thyroid U/S ordered    Patient Active Problem List   Diagnosis Date Noted   Pressure injury of skin 03/08/2020   [place] rash of groin 10/19/2018   Acute on chronic systolic heart failure ([place]) 10/17/2018   Acute on chronic systolic (congestive) heart failure ([place]) 10/17/2018   COPD exacerbation ([place]) 05/21/2018   Chronic pain syndrome 07/19/2015   Degenerative disc disease, lumbar 07/19/2015   Weakness 04/17/2015   Generalized weakness 04/17/2015   Polycythemia 04/17/2015   BPH (benign prostatic hyperplasia) 04/17/2015   Seizure disorder ([place]) 10/12/2014   TIA (transient ischemic attack) 08/23/2013   Other and unspecified angina pectoris 07/04/2013   Nontoxic multinodular goiter 10/07/2012   Unstable angina (HCC) 06/19/2012   Carotid stenosis  PAD (peripheral artery disease) (HCC)    Left inguinal hernia 05/30/2012   COPD (chronic obstructive pulmonary disease) ([place]) 08/24/2011   S/P AAA repair 08/24/2011   Lumbar disc disease    Hypertensive heart disease    Hyperlipidemia    CAD (coronary artery disease)    History of pontine CVA    GERD (gastroesophageal reflux disease)     Past Surgical History:  Procedure Laterality Date   ABDOMINAL AORTIC ANEURYSM REPAIR  10-03-2009   CORONARY ARTERY BYPASS GRAFT  06/24/2010   LIMA to the LAD, SVG to first diagonal, SVG to ramus intermediate, SVG to acute marginal. EF 50%. 2/12   EXPLORATION POST OPERATIVE OPEN HEART     EXPLORATORY LAPAROTOMY   09/2009   ligation of lumbar arteries   EXTERNAL EAR SURGERY Left    laceration child   HIP SURGERY  40 years ago   left hip bone removal and pinning   INGUINAL HERNIA REPAIR Left 07/21/2012   Procedure: HERNIA REPAIR INGUINAL ADULT;  Surgeon: Merrie Roof, MD;  Location: [place];  Service: General;  Laterality: Left;   INSERTION OF MESH Left 07/21/2012   Procedure: INSERTION OF MESH;  Surgeon: Merrie Roof, MD;  Location: [place];  Service: General;  Laterality: Left;   LEFT HEART CATHETERIZATION WITH CORONARY/GRAFT ANGIOGRAM  06/21/2012   Procedure: LEFT HEART CATHETERIZATION WITH Beatrix Fetters;  Surgeon: Minus Breeding, MD;  Location: [facility] CATH LAB;  Service: Cardiovascular;;   LEFT HEART CATHETERIZATION WITH CORONARY/GRAFT ANGIOGRAM N/A 07/04/2013   Procedure: LEFT HEART CATHETERIZATION WITH Beatrix Fetters;  Surgeon: Sinclair Grooms, MD;  Location[facility] C[facility] LAB;  Service: Cardiovascular;  Laterality: N/A;   NM MYOCAR PERF WALL MOTION  12/02/2010   inferoapical defect is fixed c/w prior infarction/scar, there is minimal borderzone ischemia.       Family History  Problem Relation Age of Onset   Alcohol abuse Father    Cancer Sister 12       unknown primary   Diabetes Mother    Hypertension Mother    Cancer Sister 20       kidney cancer    Social History   Tobacco Use   Smoking status: Former    Packs/day: 1.00    Years: 30.00    Pack years: 30.00    Types: Cigarettes    Quit date: 01/25/2010    Years since quitting: 11.1   Smokeless tobacco: Never   Tobacco comments:    "havent smoked in 7 years"  Vaping Use   Vaping Use: Never used  Substance Use Topics   Alcohol use: No    Comment: former drinker - Quit 2011.   Drug use: No    Home Medications Prior to Admission medications   Medication Sig Start Date End Date Taking? Authorizing Provider  albuterol (VENTOLIN HFA) 108 (90 Base) MCG/ACT inhaler Inhale 2 puffs into the lungs every 6 (six) hours  as needed for wheezing or shortness of breath. 03/08/20   [place] Donnamarie Poag, DO  aspirin EC 81 MG EC tablet Take 1 tablet (81 mg total) by mouth daily. Patient not taking: Reported on 03/07/2020 05/24/18   Geradine Girt, DO  budesonide-formoterol [facility]) 160-4.5 MCG/ACT inhaler Inhale 2 puffs into the lungs 2 (two) times daily. 03/08/20   [place] Donnamarie Poag, DO  doxycycline (VIBRAMYCIN) 100 MG capsule Take 1 capsule (100 mg total) by mouth 2 (two) times daily. 09/04/20   Varney Biles, MD  furosemide (LASIX)  20 MG tablet Take 1 tablet (20 mg total) by mouth daily. 02/10/20   Deno Etienne, DO  gabapentin (NEURONTIN) 300 MG capsule Take 300 mg by mouth 2 (two) times daily. 05/14/18   [provider]  metFORMIN (GLUCOPHAGE) 500 MG tablet Take 0.5 tablets (250 mg total) by mouth 2 (two) times daily. 03/08/20 06/06/20  [place] Eric J, DO  metoprolol tartrate (LOPRESSOR) 25 MG tablet Take 1 tablet (25 mg total) by mouth 2 (two) times daily. 03/08/20 06/06/20  [place] Eric J, DO  nystatin (MYCOSTATIN/NYSTOP) powder Apply topically 3 (three) times daily. Patient taking differently: Apply 1 application topically 3 (three) times daily.  10/19/18   Patwardhan, Reynold Bowen, MD  rosuvastatin (CRESTOR) 40 MG tablet Take 1 tablet (40 mg total) by mouth daily. 03/08/20 06/06/20  [place] Eric J, DO  sacubitril-valsartan (ENTRESTO) 24-26 MG Take 1 tablet by mouth every other day. 03/08/20   [place] Donnamarie Poag, DO  tamsulosin (FLOMAX) 0.4 MG CAPS capsule Take 0.4 mg by mouth daily. 12/12/19   [provider]  tiotropium (SPIRIVA HANDIHALER) 18 MCG inhalation capsule Place 1 capsule (18 mcg total) into inhaler and inhale daily. 03/08/20 06/06/20  [place] Eric J, DO  triamcinolone cream (KENALOG) 0.1 % Apply 1 application topically 2 (two) times daily. 09/04/20   Varney Biles, MD    Allergies    Patient has no known allergies.  Review of Systems   Review of Systems  Constitutional:  Positive for fatigue.  Negative for chills and fever.  Respiratory:  Positive for cough and shortness of breath.   Cardiovascular:  Negative for chest pain.  Gastrointestinal:  Negative for abdominal pain, diarrhea, nausea and vomiting.  Musculoskeletal:  Positive for myalgias. Negative for arthralgias.  Neurological:  Positive for weakness. Negative for dizziness, syncope, light-headedness, numbness and headaches.  All other systems reviewed and are negative.  Physical Exam Updated Vital Signs BP (!) 106/91 (BP Location: Left Arm)   Pulse (!) 127   Temp 98.5 F (36.9 C) (Oral)   Resp (!) 22   SpO2 91%   Physical Exam Vitals and nursing note reviewed.  Constitutional:      General: He is not in acute distress.    Appearance: Normal appearance. He is well-developed. He is ill-appearing. He is not diaphoretic.  HENT:     Head: Normocephalic and atraumatic.     Nose: Rhinorrhea present.     Mouth/Throat:     Comments: Mucous membranes slightly dry Eyes:     General:        Right eye: No discharge.        Left eye: No discharge.     Pupils: Pupils are equal, round, and reactive to light.  Cardiovascular:     Rate and Rhythm: Regular rhythm. Tachycardia present.     Pulses: Normal pulses.     Heart sounds: Normal heart sounds.     Comments: Mildly tachycardic with regular rhythm Pulmonary:     Effort: Pulmonary effort is normal. No respiratory distress.     Breath sounds: Rales present. No wheezing.     Comments: On 2 L nasal cannula patient satting at 98% with normal work of breathing, able to speak in full sentences, on auscultation patient does have crackles and significant rhonchi bilaterally but good air movement Abdominal:     General: Bowel sounds are normal. There is no distension.     Palpations: Abdomen is soft. There is no mass.     Tenderness: There is no  abdominal tenderness. There is no guarding.     Comments: Abdomen soft, nondistended, nontender to palpation in all quadrants without  guarding or peritoneal signs  Musculoskeletal:        General: No deformity.     Cervical back: Neck supple.     Right lower leg: Edema present.     Left lower leg: Edema present.     Comments: Chronic bilateral lower extremity edema No focal bony tenderness or signs of trauma over the lower extremities  Skin:    General: Skin is warm and dry.     Capillary Refill: Capillary refill takes less than 2 seconds.  Neurological:     Mental Status: He is alert and oriented to person, place, and time.     Coordination: Coordination normal.     Comments: Speech is clear, able to follow commands Moves extremities without ataxia, coordination intact  Psychiatric:        Mood and Affect: Mood normal.        Behavior: Behavior normal.    ED Results / Procedures / Treatments   Labs (all labs ordered are listed, but only abnormal results are displayed) Labs Reviewed  RESP PANEL BY RT-PCR (FLU A&B, COVID) ARPGX2 - Abnormal; Notable for the following components:      Result Value   SARS Coronavirus 2 by RT PCR POSITIVE (*)    All other components within normal limits  COMPREHENSIVE METABOLIC PANEL - Abnormal; Notable for the following components:   Sodium 132 (*)    Chloride 96 (*)    Glucose, Bld 127 (*)    Creatinine, Ser 1.34 (*)    Calcium 8.2 (*)    Total Protein 6.1 (*)    Albumin 2.8 (*)    AST 53 (*)    GFR, Estimated 57 (*)    All other components within normal limits  CBC WITH DIFFERENTIAL/PLATELET - Abnormal; Notable for the following components:   HCT 52.9 (*)    MCV 102.1 (*)    Platelets 130 (*)    All other components within normal limits    EKG None  Radiology DG Chest 2 View  Result Date: 03/07/2021 CLINICAL DATA:  Shortness of breath EXAM: CHEST - 2 VIEW COMPARISON:  02/16/2021 FINDINGS: Stable cardiomediastinal contours status post CABG. Minimal linear atelectasis within the left lung base. Lungs are otherwise clear. No pleural effusion or pneumothorax.  IMPRESSION: No active cardiopulmonary disease. Electronically Signed   By: Davina Poke D.O.   On: 03/07/2021 16:51    Procedures .Critical Care Performed by: Jacqlyn Larsen, PA-C Authorized by: Jacqlyn Larsen, PA-C   Critical care provider statement:    Critical care time (minutes):  45   Critical care time was exclusive of:  Separately billable procedures and treating other patients and teaching time   Critical care was necessary to treat or prevent imminent or life-threatening deterioration of the following conditions:  Respiratory failure (COVID acute hypoxic respiratory failure)   Critical care was time spent personally by me on the following activities:  Blood draw for specimens, development of treatment plan with patient or surrogate, discussions with consultants, discussions with primary provider, evaluation of patient's response to treatment, examination of patient, obtaining history from patient or surrogate, ordering and performing treatments and interventions, ordering and review of laboratory studies, ordering and review of radiographic studies, pulse oximetry and re-evaluation of patient's condition   I assumed direction of critical care for this patient from another provider in my specialty:  yes     Care discussed with: admitting provider     Medications Ordered in ED Medications  methylPREDNISolone sodium succinate (SOLU-MEDROL) 125 mg/2 mL injection 125 mg (has no administration in time range)  albuterol (VENTOLIN HFA) 108 (90 Base) MCG/ACT inhaler 4 puff (has no administration in time range)  remdesivir 200 mg in sodium chloride 0.9% 250 mL IVPB (0 mg Intravenous Stopped 03/08/21 0941)    Followed by  remdesivir 100 mg in sodium chloride 0.9 % 100 mL IVPB (has no administration in time range)  sodium chloride 0.9 % bolus 500 mL (500 mLs Intravenous New Bag/Given 03/08/21 0911)    ED Course  I have reviewed the triage vital signs and the nursing notes.  Pertinent labs  & imaging results that were available during my care of the patient were reviewed by me and considered in my medical decision making (see chart for details).    MDM Rules/Calculators/A&P                           70 year old male presents with worsening weakness, diagnosed with COVID infection 3 days ago, patient is unvaccinated.  Also reports some cough and shortness of breath.  Noted to be hypoxic to 87% on room air, placed on 2 L nasal cannula with improvement, underlying history of COPD.  On auscultation patient with some rhonchi, will start patient on steroids, remdesivir, and ordered albuterol treatment for hypoxia in the setting of COVID infection.  Patient does appear somewhat dehydrated and is generally weak without focal neurologic deficits.  Basic labs and chest x-ray obtained from triage.  I have independently ordered, reviewed and interpreted all labs and imaging:  Patient is confirmed COVID-positive  CBC: No leukocytosis and normal hemoglobin CMP: Mild hyponatremia and hypochloremia, with elevated creatinine from baseline of 1.34, typically 0.9, suspect this is in the setting of dehydration, no other significant electrolyte derangements.  Chest x-ray with no active cardiopulmonary disease.  Given hypoxia in the setting of COVID infection patient will require admission, suspect he is also a bit dehydrated contributing to weakness, small fluid bolus ordered.  Will consult medicine team for admission.  Case discussed with internal medicine teaching service who will see and admit the patient.  Final Clinical Impression(s) / ED Diagnoses Final diagnoses:  COVID-19 virus infection  Acute hypoxemic respiratory failure due to COVID-19 [facility])  Generalized weakness    Rx / DC Orders ED Discharge Orders     None        Janet Berlin 03/08/21 1135    Charlesetta Shanks, MD 03/13/21 [id number]

## 2021-03-08 NOTE — H&P (Signed)
Date: 03/08/2021               Patient Name:  Chris Payne MRN: [id number]  DOB: [birth year] Age / Sex: 70 y.o., male   PCP: Benito Mccreedy, MD         Medical Service: Internal Medicine Teaching Service         Attending Physician: Dr. Evette Doffing, Mallie Mussel, *    First Contact: Dr. Cheral Marker Pager: [phone]  Second Contact: Gaylan Gerold, DO Pager: Liliane Shi [phone]       After Hours (After 5p/  First Contact Pager: [phone]  weekends / holidays): Second Contact Pager: [phone]   SUBJECTIVE  Chief Complaint: Shortness of breath  History of Present Illness: Chris Payne is a 70 y.o. male with a pertinent PMH of COPD, HFrEFm CAD s/p CABG, Ruptured AAA s/p repair, CVA, HTN, Prediabetes, who presents to [facility] with acute onset shortness of breath.  Although the patient does endorse new/worsening shortness of breath over the last week,He states that the symptoms that has bothered him the most has been his left lower extremity pain.  He states that its been more swollen than usual.  He denies any recent trauma or prolonged immobilization.  He denies any past history of cancer.  He does however, endorses a recent diagnosis of COVID-19, which is what precipitated his arrive to the ED. He endorses a 1 week history of shortness of breath with exertion, orthopnea, and abdominal bloating. He has a productive cough and significant fatigue/malaise making it difficult to ambulate more than 10 feet.  He denies fever, chills, myalgias, arthralgias, chest pain, palpitations, dizziness/presyncopal symptoms, lower urinary tract symptoms, change in bowel habits, new or changes in medications, or recent travel.  He does have sick contacts including his son and wife who also tested positive for COVID-19.  He is unvaccinated for COVID and does have significant risk factors for severe COVID infection including COPD, hypertension, hear failure and prediabetes.   Medications: No current facility-administered  medications on file prior to encounter.   Current Outpatient Medications on File Prior to Encounter  Medication Sig Dispense Refill   albuterol (VENTOLIN HFA) 108 (90 Base) MCG/ACT inhaler Inhale 2 puffs into the lungs every 6 (six) hours as needed for wheezing or shortness of breath. 18 g 0   aspirin EC 81 MG EC tablet Take 1 tablet (81 mg total) by mouth daily. (Patient not taking: Reported on 03/07/2020)     budesonide-formoterol (SYMBICORT) 160-4.5 MCG/ACT inhaler Inhale 2 puffs into the lungs 2 (two) times daily. 1 each 2               gabapentin (NEURONTIN) 300 MG capsule Take 300 mg by mouth 2 (two) times daily.     metFORMIN (GLUCOPHAGE) 500 MG tablet Take 0.5 tablets (250 mg total) by mouth 2 (two) times daily. 90 tablet 0   metoprolol tartrate (LOPRESSOR) 25 MG tablet Take 1 tablet (25 mg total) by mouth 2 (two) times daily. 180 tablet 0         rosuvastatin (CRESTOR) 40 MG tablet Take 1 tablet (40 mg total) by mouth daily. 90 tablet 0         tamsulosin (FLOMAX) 0.4 MG CAPS capsule Take 0.4 mg by mouth daily.     tiotropium (SPIRIVA HANDIHALER) 18 MCG inhalation capsule Place 1 capsule (18 mcg total) into inhaler and inhale daily. 90 capsule 0          Past Medical History: Past  Medical History:  Diagnosis Date   AAA (abdominal aortic aneurysm)    s/p repair 6/11   CAD (coronary artery disease)    s/p CABG 2/12:  LIMA to LAD, SVG to diagonal-1, SVG to ramus intermedius,, SVG to AM (Dr. Roxan Hockey) ;  b.  Myoview 10/13:  inf infarct with very mild peri-infarct ischemia, EF 49%, inf HK; c  He has severe three-vessel native coronary artery disease. Catheterization March 2015 as above   CAP (community acquired pneumonia) 02/23/2014   Carotid stenosis    a.  dopplers 0/93: LICA 81-82%;  b. Carotid dopplers 3/14:  R 0-39%, L 60-79%, repeat 6 mos   CHF (congestive heart failure) (HCC)    COPD (chronic obstructive pulmonary disease) (HCC)    GERD (gastroesophageal reflux disease)     Headache(784.0)    Hyperlipidemia    Hypertension    Hypoxia 02/23/2014   Inguinal hernia    Myocardial infarction [facility])    PCI x3   PAD (peripheral artery disease) ([place])    ABIs 4/12:  R 0.88, L 0.92; R SFA 40%, L CFA 50%, L SFA 50-60%   Pneumonia    hx   Stroke [facility])    h/o pontine CVA   Thyroid nodule    incidental finding on carotid doppler 3/14 => thyroid U/S ordered    Social:  Lives - [place] Occupation - retired Research officer, trade [employer] - lives with wife and son Level of function - Independent with ADLs, wife and son help with medications  PCP - Osei-Bonsu, Iona Beard  Substance use - Former tobacco use, quit in 2011  Family History: Family History  Problem Relation Age of Onset   Alcohol abuse Father    Cancer Sister 34       unknown primary   Diabetes Mother    Hypertension Mother    Cancer Sister 70       kidney cancer    Allergies: Allergies as of 03/07/2021   (No Known Allergies)    Review of Systems: A complete ROS was negative except as per HPI.   OBJECTIVE:  Physical Exam: Blood pressure (!) 152/85, pulse 67, temperature 98.5 F (36.9 C), temperature source Oral, resp. rate 16, height 5\' 7"  (1.702 m), weight 68 kg, SpO2 100 %. Physical Exam Constitutional:      Appearance: He is ill-appearing. He is not diaphoretic.  HENT:     Head: Normocephalic and atraumatic.     Mouth/Throat:     Mouth: Mucous membranes are dry.  Eyes:     Extraocular Movements: Extraocular movements intact.     Pupils: Pupils are equal, round, and reactive to light.  Neck:     Vascular: No hepatojugular reflux or JVD.  Cardiovascular:     Rate and Rhythm: Normal rate and regular rhythm.     Comments: Absent pulses on the left lower extremity and decreased pulses on the right lower extremity, normal radial pulses. Distant heart sounds without obvious murmur. Pulmonary:     Effort: Tachypnea present. No accessory muscle usage or respiratory distress.     Breath sounds: Wheezing  (expiratory) and rales (Diffuse, L>R) present.  Abdominal:     General: There is distension.     Tenderness: There is no abdominal tenderness.  Musculoskeletal:        General: Swelling and tenderness (left calf tenderness) present.     Cervical back: Normal range of motion. No rigidity.     Right lower leg: Edema (1-2+ pitting edema to his knee) present.  Left lower leg: Edema (3+ pitting edema ot his knees) present.  Lymphadenopathy:     Cervical: No cervical adenopathy.  Skin:    Findings: Lesion (sacral pressure wound) present.     Comments: Left lower extremity was cool to the tough with evidence of stasis dermatitis  Neurological:     Mental Status: He is alert and oriented to person, place, and time. Mental status is at baseline.     Motor: Weakness (generalized) present.    Pertinent Labs: CBC    Component Value Date/Time   WBC 6.3 03/07/2021 1626   RBC 5.18 03/07/2021 1626   HGB 16.7 03/07/2021 1626   HCT 52.9 (H) 03/07/2021 1626   PLT 130 (L) 03/07/2021 1626   MCV 102.1 (H) 03/07/2021 1626   MCV 101.0 (A) 02/08/2015 1300   MCH 32.2 03/07/2021 1626   MCHC 31.6 03/07/2021 1626   RDW 12.8 03/07/2021 1626   LYMPHSABS 1.3 03/07/2021 1626   MONOABS 0.6 03/07/2021 1626   EOSABS 0.0 03/07/2021 1626   BASOSABS 0.0 03/07/2021 1626     CMP     Component Value Date/Time   NA 132 (L) 03/07/2021 1626   NA 142 11/04/2018 1005   K 3.9 03/07/2021 1626   CL 96 (L) 03/07/2021 1626   CO2 22 03/07/2021 1626   GLUCOSE 127 (H) 03/07/2021 1626   BUN 13 03/07/2021 1626   BUN 15 11/04/2018 1005   CREATININE 1.34 (H) 03/07/2021 1626   CREATININE 0.94 05/24/2015 1149   CALCIUM 8.2 (L) 03/07/2021 1626   PROT 6.1 (L) 03/07/2021 1626   ALBUMIN 2.8 (L) 03/07/2021 1626   AST 53 (H) 03/07/2021 1626   ALT 30 03/07/2021 1626   ALKPHOS 82 03/07/2021 1626   BILITOT 0.9 03/07/2021 1626   GFRNONAA 57 (L) 03/07/2021 1626   GFRNONAA >89 03/09/2014 1036   GFRAA >60 11/09/2019 1859    GFRAA >89 03/09/2014 1036    Pertinent Imaging: DG Chest 2 View  Result Date: 03/07/2021 CLINICAL DATA:  Shortness of breath EXAM: CHEST - 2 VIEW COMPARISON:  02/16/2021 FINDINGS: Stable cardiomediastinal contours status post CABG. Minimal linear atelectasis within the left lung base. Lungs are otherwise clear. No pleural effusion or pneumothorax. IMPRESSION: No active cardiopulmonary disease. Electronically Signed   By: Davina Poke D.O.   On: 03/07/2021 16:51    EKG: repeat pending  ASSESSMENT & PLAN:  Assessment: Active Problems:   Hypertensive heart disease   Hyperlipidemia   COPD (chronic obstructive pulmonary disease) (HCC)   CAD (coronary artery disease)   S/P AAA repair   History of pontine CVA   GERD (gastroesophageal reflux disease)   Carotid stenosis   PAD (peripheral artery disease) (HCC)   TIA (transient ischemic attack)   BPH (benign prostatic hyperplasia)   Shortness of breath   HFrEF (heart failure with reduced ejection fraction) ([place])   Chris Payne is a 70 y.o. with pertinent PMH of COPD, HFrEFm CAD s/p CABG, Ruptured AAA s/p repair, CVA, HTN, Prediabetes who presented with Shortness of breath and admit for acute hypoxic respiratory failure on hospital day 0  Plan: #Acute Hypoxic Respiratory Failure, Multifactorial: Patient presents with shortness of breath and new oxygen requirement of 2 L nasal cannula. He tested positive for COVID-19, but remains afebrile without leukocytosis or systemic signs of infection.  Checks x-ray was within normal limits without any significant signs of vascular congestion or consolidations.  He does have a significant history of COPD with multiple admissions for COPD exacerbations  in the past which could be contributing to his presenting symptoms.  However, he does endorse signs and symptoms of heart failure including orthopnea, lower extremity edema, exertional dyspnea, and abdominal bloating.  In the ED he was given steroids and  started on remdesivir for presumed COVID-19 pneumonia. Differential includes COPD exacerbation versus CHF exacerbation versus COVID-19 pneumonia - Will get COVID labs including fibrinogen, ferritin, CRP, and D-dimer - Cont steroids daily for 10 days, will d/c if O2 requirements improves - Likely D/c remdesivir  - Will get BNP and Echo - Will start IV diuretic depending on results - No need for AB - Continue Sup O2 - Start flutter valve and incentive spirometry  - Will start guaifenesin and consider hypertonic saline nebs for secretions  Right Lower Extremity Swelling and Pain: Patient presents with new left lower extremity swelling out of proportion to his right lower extremity.  He states that he recently developed pain in his calf this week with positive Homans' sign.  His lower extremity is cool to the touch with decreased pulses.  He did test positive for COVID which raises his risk for VTE.  Well score shows moderate to high risk for DVT. I will get Doppler ultrasound of his left lower extremity today. He does have a significant history of PAD and may need repeat ABIs during this hospitalization. - Doppler US of left lower extremity - Will start DVT ppx  - If positive, than will start Heparin gtt - Consider ABIs during this hospitalization  AKI: Patient presents with an acute elevation in his creatinine from a baseline of 0.95-1.34 with a GFR of 57.  He does endorse decreased p.o. intake over the last week raising concern for possible prerenal azotemia.  However he does have a BUN/creatinine ratio of 10 raising concern for intrarenal or postrenal azotemia. He does have a history of BPH on tamsulosin.  We will follow his urine output closely. - Strict INO - Will get urinalysis  and urine Na - Trend kidney function daily  #COPD Patient does present with symptoms concerning for possible COPD exacerbation with 2/3 cardinal symptoms. Home meds include albuterol, Symbicort, and Spiriva. No  previous PFTs. Will start steroids and continue home medications - Start systemic steroids daily - Continue home medications listed above.   #HFrEF (EF of 40%) #Ischemic Cardiomyopathy: Patient had a stress test performed in 2020 which showed a mildly reduced EF of 40%. His home meds include furosemide 20 mg daily, metoprolol 25 mg BID, and Entresto 24-26 mg  every other day, but he states that he is only taking the metoprolol and is not on any diuretics at home. He does not have a recent Echo on file. He does appear volume overloaded on exam with lower extremity edema, rales, expiratory wheezing and abdominal bloating.  - Echo pending - BNP pending - Hold metoprolol for now due to concern for infection versus COPD exac.  - Strict INO and daily weights  #HLD #CAD s/p CABG (2012) with severe three vessel disease (Cath 2015) #Unstable angina  #PAD  #Carotid Stenosis, L>R #Hx of Pontine Infarct and TIA Stress test performed in 2020 showing moderate to large sized inferior/lateral transmural scar with peri-infarct ischemia and akinesis. His home meds include ASA 81 mg daily and rosuvastatin 40 mg daily. Spoke with patient's son who states that they he was recently evaluated for possible carotid aneurysm in the past month.  He was unable to provide significant information regarding this visit.  He does have  a significant history of carotid stenosis left greater than right.  We will consider further evaluation of this in the near future but currently asymptomatic with no chest pain, palpitations, or altered mental status.  Additionally, he has cool lower extremities with a history of peripheral artery disease.  Will likely need ABIs during this hospitalization. - Cont ASA and rosuvastatin - Repeat EKG due to poor quality study - Will need to ABIs as well.   #HTN #AAA s/p repair (2011) -We will hold metoprolol for now  #Prediabetes: His last a1c was 6.0 a year ago. He is currently on metformin  250 mg BID.  Will likely have hyperglycemia in the setting of steroid use. -We will start CBG monitoring -Start SSI  #Degenerative Disk Disease #Chronic Pain Syndrome: He takes gabapentin 300 mg BID.   #BPH: He is on tumsulosin 0.4 mg daily.   GERD: - Will continue PPI daily.  Decubitus Pressure Wound, Grade 1-2 - Will get sacral pressure pad to offload his sacrum  Best Practice: Diet: Cardiac diet IVF: Fluids: none, Rate: None VTE: enoxaparin (LOVENOX) injection 40 mg Start: 03/08/21 1600enoxaparin Code: Full AB: none Status: Inpatient with expected length of stay greater than 2 midnights. Anticipated Discharge Location:  pending Barriers to Discharge: Medical stability  Signature: Lawerance Cruel, D.O.  Internal Medicine Resident, PGY-3 [facility]  Pager: [phone] 10:59 AM, 03/08/2021   Please contact the on call pager after 5 pm and on weekends at [phone].

## 2021-03-08 NOTE — Progress Notes (Signed)
ANTICOAGULATION CONSULT NOTE - Initial Consult  Pharmacy Consult for heparin Indication: ischemic limb  No Known Allergies  Patient Measurements: Height: 5\' 7"  (170.2 cm) Weight: 68 kg (150 lb) IBW/kg (Calculated) : 66.1 Heparin Dosing Weight: 68 kg  Vital Signs: Temp: 98.5 F (36.9 C) (11/12 0416) Temp Source: Oral (11/12 0416) BP: 139/73 (11/12 1400) Pulse Rate: 66 (11/12 1400)  Labs: Recent Labs    03/07/21 1626 03/08/21 1251  HGB 16.7  --   HCT 52.9*  --   PLT 130*  --   APTT  --  37*  LABPROT  --  13.8  INR  --  1.1  CREATININE 1.34* 1.71*    Estimated Creatinine Clearance: 37 mL/min (A) (by C-G formula based on SCr of 1.71 mg/dL (H)).   Medical History: Past Medical History:  Diagnosis Date   AAA (abdominal aortic aneurysm)    s/p repair 6/11   CAD (coronary artery disease)    s/p CABG 2/12:  LIMA to LAD, SVG to diagonal-1, SVG to ramus intermedius,, SVG to AM (Dr. Roxan Hockey) ;  b.  Myoview 10/13:  inf infarct with very mild peri-infarct ischemia, EF 49%, inf HK; c  He has severe three-vessel native coronary artery disease. Catheterization March 2015 as above   CAP (community acquired pneumonia) 02/23/2014   Carotid stenosis    a.  dopplers 1/44: LICA 31-54%;  b. Carotid dopplers 3/14:  R 0-39%, L 60-79%, repeat 6 mos   CHF (congestive heart failure) (HCC)    COPD (chronic obstructive pulmonary disease) (HCC)    GERD (gastroesophageal reflux disease)    Headache(784.0)    Hyperlipidemia    Hypertension    Hypoxia 02/23/2014   Inguinal hernia    Myocardial infarction [facility])    PCI x3   PAD (peripheral artery disease) ([place])    ABIs 4/12:  R 0.88, L 0.92; R SFA 40%, L CFA 50%, L SFA 50-60%   Pneumonia    hx   Stroke [facility])    h/o pontine CVA   Thyroid nodule    incidental finding on carotid doppler 3/14 => thyroid U/S ordered    Medications:  see MAR  Assessment: 70 yo M with critical limb ischemia > hep gtt placed for AC. Vascular surgery  consulted. No AC PTA. CBC ok.   Goal of Therapy:  Heparin level 0.3-0.7 units/ml Monitor platelets by anticoagulation protocol: Yes   Plan:  Give 4500 units bolus x 1 Start heparin infusion at 1150 units/hr Check anti-Xa level in 8 hours and daily while on heparin Continue to monitor H&H and platelets  Joetta Manners, PharmD, [facility] Emergency Medicine Clinical Pharmacist ED RPh Phone: [place]: [phone]

## 2021-03-08 NOTE — ED Notes (Signed)
ED TO INPATIENT HANDOFF REPORT  ED Nurse Name and Phone #: Ivette Loyal., RN  S Name/Age/Gender Chris Payne 70 y.o. male Room/Bed: 047C/047C  Code Status   Code Status: Full Code  Home/SNF/Other Home Patient oriented to: self, place, time, and situation Is this baseline? Yes   Triage Complete: Triage complete  Chief Complaint Shortness of breath [R06.02]  Triage Note Arrived via EMS; endorsed diagnosis w/ Covid 3 days ago and been having weakness since then. Reported hx of weakness and has + crackles. Reported fall last night d/t bilateral leg weakness.    Allergies No Known Allergies  Level of Care/Admitting Diagnosis ED Disposition     ED Disposition  Admit   Condition  --   Comment  Hospital Area: [facility] [[id number]]  Level of Care: Telemetry Medical [104]  May admit patient to [facility] or [facility] if equivalent level of care is available:: Yes  Covid Evaluation: Confirmed COVID Positive  Diagnosis: Shortness of breath [786.05.ICD-9-CM]  Admitting Physician: Axel Filler [[id number]]  Attending Physician: Axel Filler [[id number]]  Estimated length of stay: past midnight tomorrow  Certification:: I certify this patient will need inpatient services for at least 2 midnights          B Medical/Surgery History Past Medical History:  Diagnosis Date   AAA (abdominal aortic aneurysm)    s/p repair 6/11   CAD (coronary artery disease)    s/p CABG 2/12:  LIMA to LAD, SVG to diagonal-1, SVG to ramus intermedius,, SVG to AM (Dr. Roxan Hockey) ;  b.  Myoview 10/13:  inf infarct with very mild peri-infarct ischemia, EF 49%, inf HK; c  He has severe three-vessel native coronary artery disease. Catheterization March 2015 as above   CAP (community acquired pneumonia) 02/23/2014   Carotid stenosis    a.  dopplers 2/50: LICA 53-97%;  b. Carotid dopplers 3/14:  R 0-39%, L 60-79%, repeat 6 mos   CHF (congestive heart failure) (HCC)     COPD (chronic obstructive pulmonary disease) (HCC)    GERD (gastroesophageal reflux disease)    Headache(784.0)    Hyperlipidemia    Hypertension    Hypoxia 02/23/2014   Inguinal hernia    Myocardial infarction [facility]    PCI x3   PAD (peripheral artery disease) ([place])    ABIs 4/12:  R 0.88, L 0.92; R SFA 40%, L CFA 50%, L SFA 50-60%   Pneumonia    hx   Stroke [facility])    h/o pontine CVA   Thyroid nodule    incidental finding on carotid doppler 3/14 => thyroid U/S ordered   Past Surgical History:  Procedure Laterality Date   ABDOMINAL AORTIC ANEURYSM REPAIR  10-03-2009   CORONARY ARTERY BYPASS GRAFT  06/24/2010   LIMA to the LAD, SVG to first diagonal, SVG to ramus intermediate, SVG to acute marginal. EF 50%. 2/12   EXPLORATION POST OPERATIVE OPEN HEART     EXPLORATORY LAPAROTOMY  09/2009   ligation of lumbar arteries   EXTERNAL EAR SURGERY Left    laceration child   HIP SURGERY  40 years ago   left hip bone removal and pinning   INGUINAL HERNIA REPAIR Left 07/21/2012   Procedure: HERNIA REPAIR INGUINAL ADULT;  Surgeon: Merrie Roof, MD;  Location: [place];  Service: General;  Laterality: Left;   INSERTION OF MESH Left 07/21/2012   Procedure: INSERTION OF MESH;  Surgeon: Merrie Roof, MD;  Location: [place];  Service: General;  Laterality: Left;   LEFT HEART CATHETERIZATION WITH CORONARY/GRAFT ANGIOGRAM  06/21/2012   Procedure: LEFT HEART CATHETERIZATION WITH Beatrix Fetters;  Surgeon: Minus Breeding, MD;  Location[facility] C[facility] LAB;  Service: Cardiovascular;;   LEFT HEART CATHETERIZATION WITH CORONARY/GRAFT ANGIOGRAM N/A 07/04/2013   Procedure: LEFT HEART CATHETERIZATION WITH Beatrix Fetters;  Surgeon: Sinclair Grooms, MD;  Location: [facility] CATH LAB;  Service: Cardiovascular;  Laterality: N/A;   NM MYOCAR PERF WALL MOTION  12/02/2010   inferoapical defect is fixed c/w prior infarction/scar, there is minimal borderzone ischemia.     A IV Location/Drains/Wounds Patient  Lines/Drains/Airways Status     Active Line/Drains/Airways     Name Placement date Placement time Site Days   Peripheral IV 03/08/21 20 G Left Antecubital 03/08/21  0904  Antecubital  less than 1   Pressure Injury 03/07/20 Sacrum Right Stage 1 -  Intact skin with non-blanchable redness of a localized area usually over a bony prominence. 03/07/20  2003  -- 366            Intake/Output Last 24 hours  Intake/Output Summary (Last 24 hours) at 03/08/2021 1540 Last data filed at 03/08/2021 1011 Gross per 24 hour  Intake 747.04 ml  Output --  Net 747.04 ml    Labs/Imaging Results for orders placed or performed during the hospital encounter of 03/07/21 (from the past 48 hour(s))  Resp Panel by RT-PCR (Flu A&B, Covid) Nasopharyngeal Swab     Status: Abnormal   Collection Time: 03/07/21  4:18 PM   Specimen: Nasopharyngeal Swab; Nasopharyngeal(NP) swabs in vial transport medium  Result Value Ref Range   SARS Coronavirus 2 by RT PCR POSITIVE (A) NEGATIVE    Comment: RESULT CALLED TO, READ BACK BY AND VERIFIED WITH: LOUIE CATA RN 03/07/2021 @1838  BY JW  (NOTE) SARS-CoV-2 target nucleic acids are DETECTED.  The SARS-CoV-2 RNA is generally detectable in upper respiratory specimens during the acute phase of infection. Positive results are indicative of the presence of the identified virus, but do not rule out bacterial infection or co-infection with other pathogens not detected by the test. Clinical correlation with patient history and other diagnostic information is necessary to determine patient infection status. The expected result is Negative.  Fact Sheet for Patients: EntrepreneurPulse.com.au  Fact Sheet for Healthcare Providers: IncredibleEmployment.be  This test is not yet approved or cleared by the [place] FDA and  has been authorized for detection and/or diagnosis of SARS-CoV-2 by FDA under an Emergency Use Authorization (EUA).   This EUA will remain in effect (meaning this test ca n be used) for the duration of  the COVID-19 declaration under Section 564(b)(1) of the Act, 21 U.S.C. section 360bbb-3(b)(1), unless the authorization is terminated or revoked sooner.     Influenza A by PCR NEGATIVE NEGATIVE   Influenza B by PCR NEGATIVE NEGATIVE    Comment: (NOTE) The Xpert Xpress SARS-CoV-2/FLU/RSV plus assay is intended as an aid in the diagnosis of influenza from Nasopharyngeal swab specimens and should not be used as a sole basis for treatment. Nasal washings and aspirates are unacceptable for Xpert Xpress SARS-CoV-2/FLU/RSV testing.  Fact Sheet for Patients: EntrepreneurPulse.com.au  Fact Sheet for Healthcare Providers: IncredibleEmployment.be  This test is not yet approved or cleared by the [place] FDA and has been authorized for detection and/or diagnosis of SARS-CoV-2 by FDA under an Emergency Use Authorization (EUA). This EUA will remain in effect (meaning this test can be used) for the duration of the COVID-19  declaration under Section 564(b)(1) of the Act, 21 U.S.C. section 360bbb-3(b)(1), unless the authorization is terminated or revoked.  Performed at [facility], [place] [address], [place], [place] [address]   Comprehensive metabolic panel     Status: Abnormal   Collection Time: 03/07/21  4:26 PM  Result Value Ref Range   Sodium 132 (L) 135 - 145 mmol/L   Potassium 3.9 3.5 - 5.1 mmol/L   Chloride 96 (L) 98 - 111 mmol/L   CO2 22 22 - 32 mmol/L   Glucose, Bld 127 (H) 70 - 99 mg/dL    Comment: Glucose reference range applies only to samples taken after fasting for at least 8 hours.   BUN 13 8 - 23 mg/dL   Creatinine, Ser 1.34 (H) 0.61 - 1.24 mg/dL   Calcium 8.2 (L) 8.9 - 10.3 mg/dL   Total Protein 6.1 (L) 6.5 - 8.1 g/dL   Albumin 2.8 (L) 3.5 - 5.0 g/dL   AST 53 (H) 15 - 41 U/L   ALT 30 0 - 44 U/L   Alkaline Phosphatase 82 38 - 126 U/L    Total Bilirubin 0.9 0.3 - 1.2 mg/dL   GFR, Estimated 57 (L) >60 mL/min    Comment: (NOTE) Calculated using the CKD-EPI Creatinine Equation (2021)    Anion gap 14 5 - 15    Comment: Performed at [facility], [place] [address], [place], [place] [address]  CBC with Differential     Status: Abnormal   Collection Time: 03/07/21  4:26 PM  Result Value Ref Range   WBC 6.3 4.0 - 10.5 K/uL   RBC 5.18 4.22 - 5.81 MIL/uL   Hemoglobin 16.7 13.0 - 17.0 g/dL   HCT 52.9 (H) 39.0 - 52.0 %   MCV 102.1 (H) 80.0 - 100.0 fL   MCH 32.2 26.0 - 34.0 pg   MCHC 31.6 30.0 - 36.0 g/dL   RDW 12.8 11.5 - 15.5 %   Platelets 130 (L) 150 - 400 K/uL   nRBC 0.0 0.0 - 0.2 %   Neutrophils Relative % 69 %   Neutro Abs 4.4 1.7 - 7.7 K/uL   Lymphocytes Relative 20 %   Lymphs Abs 1.3 0.7 - 4.0 K/uL   Monocytes Relative 10 %   Monocytes Absolute 0.6 0.1 - 1.0 K/uL   Eosinophils Relative 0 %   Eosinophils Absolute 0.0 0.0 - 0.5 K/uL   Basophils Relative 0 %   Basophils Absolute 0.0 0.0 - 0.1 K/uL   Immature Granulocytes 1 %   Abs Immature Granulocytes 0.03 0.00 - 0.07 K/uL    Comment: Performed at [facility] [address] [place], [place] [address]  Hemoglobin A1c     Status: Abnormal   Collection Time: 03/08/21  9:26 AM  Result Value Ref Range   Hgb A1c MFr Bld 6.1 (H) 4.8 - 5.6 %    Comment: (NOTE) Pre diabetes:          5.7%-6.4%  Diabetes:              >6.4%  Glycemic control for   <7.0% adults with diabetes    Mean Plasma Glucose 128.37 mg/dL    Comment: Performed at [facility] [address] [place], [place] [address]  CBG monitoring, ED     Status: Abnormal   Collection Time: 03/08/21 10:16 AM  Result Value Ref Range   Glucose-Capillary 115 (H) 70 - 99 mg/dL    Comment: Glucose  reference range applies only to samples taken after fasting for at least 8 hours.  CBG monitoring, ED     Status: Abnormal   Collection Time: 03/08/21 12:15 PM  Result Value Ref Range    Glucose-Capillary 114 (H) 70 - 99 mg/dL    Comment: Glucose reference range applies only to samples taken after fasting for at least 8 hours.  Brain natriuretic peptide     Status: None   Collection Time: 03/08/21 12:51 PM  Result Value Ref Range   B Natriuretic Peptide 86.1 0.0 - 100.0 pg/mL    Comment: Performed at [facility], [place] [address], [place], [place] [address]  CBC with Differential/Platelet     Status: Abnormal   Collection Time: 03/08/21 12:51 PM  Result Value Ref Range   WBC 4.8 4.0 - 10.5 K/uL   RBC 5.32 4.22 - 5.81 MIL/uL   Hemoglobin 17.6 (H) 13.0 - 17.0 g/dL   HCT 51.5 39.0 - 52.0 %   MCV 96.8 80.0 - 100.0 fL   MCH 33.1 26.0 - 34.0 pg   MCHC 34.2 30.0 - 36.0 g/dL   RDW 12.7 11.5 - 15.5 %   Platelets 104 (L) 150 - 400 K/uL    Comment: Immature Platelet Fraction may be clinically indicated, consider ordering this additional test [id number] REPEATED TO VERIFY PLATELET COUNT CONFIRMED BY SMEAR    nRBC 0.0 0.0 - 0.2 %   Neutrophils Relative % 79 %   Neutro Abs 3.8 1.7 - 7.7 K/uL   Lymphocytes Relative 15 %   Lymphs Abs 0.7 0.7 - 4.0 K/uL   Monocytes Relative 6 %   Monocytes Absolute 0.3 0.1 - 1.0 K/uL   Eosinophils Relative 0 %   Eosinophils Absolute 0.0 0.0 - 0.5 K/uL   Basophils Relative 0 %   Basophils Absolute 0.0 0.0 - 0.1 K/uL   WBC Morphology MORPHOLOGY UNREMARKABLE    RBC Morphology MORPHOLOGY UNREMARKABLE    Smear Review MORPHOLOGY UNREMARKABLE    Immature Granulocytes 0 %   Abs Immature Granulocytes 0.02 0.00 - 0.07 K/uL    Comment: Performed at [facility], [address], [place], [place] [address]  Comprehensive metabolic panel     Status: Abnormal   Collection Time: 03/08/21 12:51 PM  Result Value Ref Range   Sodium 134 (L) 135 - 145 mmol/L   Potassium 3.6 3.5 - 5.1 mmol/L   Chloride 99 98 - 111 mmol/L   CO2 23 22 - 32 mmol/L   Glucose, Bld 114 (H) 70 - 99 mg/dL    Comment: Glucose reference range applies only to samples taken  after fasting for at least 8 hours.   BUN 22 8 - 23 mg/dL   Creatinine, Ser 1.71 (H) 0.61 - 1.24 mg/dL   Calcium 8.3 (L) 8.9 - 10.3 mg/dL   Total Protein 6.0 (L) 6.5 - 8.1 g/dL   Albumin 2.6 (L) 3.5 - 5.0 g/dL   AST 49 (H) 15 - 41 U/L   ALT 32 0 - 44 U/L   Alkaline Phosphatase 80 38 - 126 U/L   Total Bilirubin 0.6 0.3 - 1.2 mg/dL   GFR, Estimated 42 (L) >60 mL/min    Comment: (NOTE) Calculated using the CKD-EPI Creatinine Equation (2021)    Anion gap 12 5 - 15    Comment: Performed at [facility], [place] [address], [place], [place] [address]  Magnesium     Status: None   Collection Time: 03/08/21 12:51  PM  Result Value Ref Range   Magnesium 2.0 1.7 - 2.4 mg/dL    Comment: Performed at [facility], [place] [address], [place], [place] [address]  Phosphorus     Status: Abnormal   Collection Time: 03/08/21 12:51 PM  Result Value Ref Range   Phosphorus 1.6 (L) 2.5 - 4.6 mg/dL    Comment: Performed at [facility] [address], [place], [place] [address]  Protime-INR     Status: None   Collection Time: 03/08/21 12:51 PM  Result Value Ref Range   Prothrombin Time 13.8 11.4 - 15.2 seconds   INR 1.1 0.8 - 1.2    Comment: (NOTE) INR goal varies based on device and disease states. Performed at [facility], [place] [address], [place], [place] [address]   APTT     Status: Abnormal   Collection Time: 03/08/21 12:51 PM  Result Value Ref Range   aPTT 37 (H) 24 - 36 seconds    Comment:        IF BASELINE aPTT IS ELEVATED, SUGGEST PATIENT RISK ASSESSMENT BE USED TO DETERMINE APPROPRIATE ANTICOAGULANT THERAPY. Performed at [facility], [place] [address], [place], [place] [address]   C-reactive protein     Status: Abnormal   Collection Time: 03/08/21 12:51 PM  Result Value Ref Range   CRP 11.9 (H) <1.0 mg/dL    Comment: Performed at [facility], [place] [address] [place], [place] [address]  Ferritin     Status: Abnormal   Collection Time: 03/08/21 12:51  PM  Result Value Ref Range   Ferritin 473 (H) 24 - 336 ng/mL    Comment: Performed at [facility], [place] [address], [place], [place] [address]  D-dimer, quantitative     Status: Abnormal   Collection Time: 03/08/21 12:51 PM  Result Value Ref Range   D-Dimer, Quant 3.98 (H) 0.00 - 0.50 ug/mL-FEU    Comment: (NOTE) At the manufacturer cut-off value of 0.5 g/mL FEU, this assay has a negative predictive value of 95-100%.This assay is intended for use in conjunction with a clinical pretest probability (PTP) assessment model to exclude pulmonary embolism (PE) and deep venous thrombosis (DVT) in outpatients suspected of PE or DVT. Results should be correlated with clinical presentation. Performed at [facility], [place] [address], [place], [place] [address]   Fibrinogen     Status: None   Collection Time: 03/08/21 12:51 PM  Result Value Ref Range   Fibrinogen 473 210 - 475 mg/dL    Comment: (NOTE) Fibrinogen results may be underestimated in patients receiving thrombolytic therapy. Performed at [facility], [place] [address], [place], [place] [address]    DG Chest 2 View  Result Date: 03/07/2021 CLINICAL DATA:  Shortness of breath EXAM: CHEST - 2 VIEW COMPARISON:  02/16/2021 FINDINGS: Stable cardiomediastinal contours status post CABG. Minimal linear atelectasis within the left lung base. Lungs are otherwise clear. No pleural effusion or pneumothorax. IMPRESSION: No active cardiopulmonary disease. Electronically Signed   By: Davina Poke D.O.   On: 03/07/2021 16:51   VAS [place] ABI WITH/WO TBI  Result Date: 03/08/2021  LOWER EXTREMITY DOPPLER STUDY Patient Name:  Chris Payne  Date of Exam:   03/08/2021 Medical Rec #: [id number]        Accession #:    [id number] Date of Birth: January 29, 1951        Patient Gender: M Patient Age:   56 years  Exam Location[facility]  [facility] Procedure:      VAS [place] ABI WITH/WO TBI Referring Phys: Damita Dunnings VINCENT  --------------------------------------------------------------------------------  Indications: Peripheral artery disease, and Left lower extremity cool, no              palpable pulses. High Risk Factors: Hypertension, hyperlipidemia, past history of smoking, prior                    MI, coronary artery disease, prior CVA.  Limitations: Today's exam was limited due to significant left lower extremity              swelling. Comparison Study: No prior study Performing Technologist: Maudry Mayhew MHA, RDMS, RVT, RDCS  Examination Guidelines: A complete evaluation includes at minimum, Doppler waveform signals and systolic blood pressure reading at the level of bilateral brachial, anterior tibial, and posterior tibial arteries, when vessel segments are accessible. Bilateral testing is considered an integral part of a complete examination. Photoelectric Plethysmograph (PPG) waveforms and toe systolic pressure readings are included as required and additional duplex testing as needed. Limited examinations for reoccurring indications may be performed as noted.  ABI Findings: +---------+------------------+-----+----------+--------+ Right    Rt Pressure (mmHg)IndexWaveform  Comment  +---------+------------------+-----+----------+--------+ Brachial 151                    triphasic          +---------+------------------+-----+----------+--------+ PTA      101               0.66 monophasic         +---------+------------------+-----+----------+--------+ DP       94                0.62 monophasic         +---------+------------------+-----+----------+--------+ Great Toe75                0.49                    +---------+------------------+-----+----------+--------+ +---------+------------------+-----+----------+-------+ Left     Lt Pressure (mmHg)IndexWaveform  Comment +---------+------------------+-----+----------+-------+ Brachial 152                    triphasic          +---------+------------------+-----+----------+-------+ PTA      102               0.67 monophasic        +---------+------------------+-----+----------+-------+ DP       94                0.62 monophasic        +---------+------------------+-----+----------+-------+ Great Toe0                 0.00 Absent            +---------+------------------+-----+----------+-------+ +-------+-----------+-----------+------------+------------+ ABI/TBIToday's ABIToday's TBIPrevious ABIPrevious TBI +-------+-----------+-----------+------------+------------+ Right  0.66       0.49                                +-------+-----------+-----------+------------+------------+ Left   0.67       0.00                                +-------+-----------+-----------+------------+------------+   Summary: Right: Resting right ankle-brachial index indicates moderate right lower extremity arterial disease. The right toe-brachial index is  abnormal. Left: Resting left ankle-brachial index indicates moderate left lower extremity arterial disease. Absent left great toe waveform  *See table(s) above for measurements and observations.     Preliminary    ECHOCARDIOGRAM COMPLETE  Result Date: 03/08/2021    ECHOCARDIOGRAM REPORT   Patient Name:   Chris Payne Date of Exam: 03/08/2021 Medical Rec #:  [id number]       Height:       67.0 in Accession #:    [id number]      Weight:       150.0 lb Date of Birth:  [birth year]       BSA:          1.790 m Patient Age:    45 years        BP:           119/72 mmHg Patient Gender: M               HR:           69 bpm. Exam Location:  Inpatient Procedure: 2D Echo, Cardiac Doppler, Color Doppler and Intracardiac            Opacification Agent Indications:    CHF-Acute Systolic  History:        Patient has prior history of Echocardiogram examinations, most                 recent 08/24/2013. CAD, COPD, Stroke and PAD,                 Signs/Symptoms:Shortness of Breath; Risk  Factors:Hypertension                 and Dyslipidemia. S/P AAA REPAIR, COVID +.  Sonographer:    Wenda Low Referring Phys: [id number] [place]  1. Challanging to assess wall motion. All walls appear to be thickening well except apex akinetic. Left ventricular ejection fraction, by estimation, is 55 to 60%. The left ventricle has normal function. There is mild left ventricular hypertrophy. Left ventricular diastolic parameters are consistent with Grade I diastolic dysfunction (impaired relaxation).  2. Right ventricular systolic function is normal. The right ventricular size is normal.  3. The mitral valve was not well visualized. No evidence of mitral valve regurgitation.  4. The aortic valve was not well visualized. Aortic valve regurgitation is not visualized.  5. 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 Aortic dilatation noted.  6. The inferior vena cava is normal in size with greater than 50% respiratory variability, suggesting right atrial pressure of 3 mmHg. Conclusion(s)/Recommendation(s): Limited study. Echo unchanged from prior 2015. FINDINGS  Left Ventricle: Challanging to assess wall motion. All walls appear to be thickening well except apex akinetic. Left ventricular ejection fraction, by estimation, is 55 to 60%. The left ventricle has normal function. The left ventricular internal cavity  size was normal in size. There is mild left ventricular hypertrophy. Left ventricular diastolic parameters are consistent with Grade I diastolic dysfunction (impaired relaxation). Right Ventricle: The right ventricular size is normal. No increase in right ventricular wall thickness. Right ventricular systolic function is normal. Left Atrium: Left atrial size was normal in size. Right Atrium: Right atrial size was normal in size. Pericardium: There is no evidence of pericardial effusion. Mitral Valve: The mitral valve was not well visualized. No evidence of mitral valve regurgitation. MV peak gradient,  1.9 mmHg. The mean mitral valve gradient is 1.0 mmHg. Tricuspid Valve: The tricuspid valve is not well visualized. Aortic Valve: The aortic  valve was not well visualized. Aortic valve regurgitation is not visualized. Aortic valve mean gradient measures 1.0 mmHg. Aortic valve peak gradient measures 2.7 mmHg. Aortic valve area, by VTI measures 2.57 cm. Pulmonic Valve: The pulmonic valve was not well visualized. 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 Aortic dilatation noted. Venous: The inferior vena cava is normal in size with greater than 50% respiratory variability, suggesting right atrial pressure of 3 mmHg. IAS/Shunts: The interatrial septum was not well visualized.  LEFT VENTRICLE PLAX 2D LVIDd:         4.10 cm   Diastology LVIDs:         3.00 cm   LV e' lateral:   6.66 cm/s LV PW:         1.30 cm   LV E/e' lateral: 7.0 LV IVS:        1.10 cm LVOT diam:     2.10 cm LV SV:         44 LV SV Index:   25 LVOT Area:     3.46 cm  LEFT ATRIUM         Index LA diam:    3.30 cm 1.84 cm/m  AORTIC VALVE AV Area (Vmax):    2.95 cm AV Area (Vmean):   3.05 cm AV Area (VTI):     2.57 cm AV Vmax:           81.90 cm/s AV Vmean:          53.000 cm/s AV VTI:            0.171 m AV Peak Grad:      2.7 mmHg AV Mean Grad:      1.0 mmHg LVOT Vmax:         69.80 cm/s LVOT Vmean:        46.700 cm/s LVOT VTI:          0.127 m LVOT/AV VTI ratio: 0.74  AORTA Ao Root diam: 3.80 cm MITRAL VALVE MV Area (PHT): 2.87 cm    SHUNTS MV Area VTI:   2.82 cm    Systemic VTI:  0.13 m MV Peak grad:  1.9 mmHg    Systemic Diam: 2.10 cm MV Mean grad:  1.0 mmHg MV Vmax:       0.69 m/s MV Vmean:      33.9 cm/s MV Decel Time: 264 msec MV E velocity: 46.30 cm/s MV A velocity: 72.40 cm/s MV E/A ratio:  0.64 Mary Scientist, physiological signed by Phineas Inches Signature Date/Time: 03/08/2021/12:38:03 PM    Final     Pending Labs Unresulted Labs (From admission, onward)     Start     Ordered   03/09/21 0500  Comprehensive metabolic panel  Tomorrow  morning,   R        03/08/21 0926   03/09/21 0500  C-reactive protein  Daily,   R      03/08/21 0952   03/09/21 0500  D-dimer, quantitative  Daily,   R      03/08/21 0952   03/09/21 0500  Ferritin  Daily,   R      03/08/21 0952   03/09/21 0500  Heparin level (unfractionated)  Daily,   R      03/08/21 1414   03/09/21 0500  CBC  Daily,   R      03/08/21 1414   03/08/21 2300  Heparin level (unfractionated)  Once-Timed,   TIMED        03/08/21 1414  03/08/21 1015  Urinalysis, Routine w reflex microscopic  Once,   R        03/08/21 1014   03/08/21 1015  Sodium, urine, random  Once,   R        03/08/21 1014            Vitals/Pain Today's Vitals   03/08/21 1230 03/08/21 1315 03/08/21 1400 03/08/21 1445  BP: 136/71 116/83 139/73 125/78  Pulse: 69 87 66 74  Resp: 15 (!) 23 17 16   Temp:      TempSrc:      SpO2: 92% 94% 94% 94%  Weight:      Height:      PainSc:        Isolation Precautions Airborne and Contact precautions  Medications Medications  remdesivir 200 mg in sodium chloride 0.9% 250 mL IVPB (0 mg Intravenous Stopped 03/08/21 0941)    Followed by  remdesivir 100 mg in sodium chloride 0.9 % 100 mL IVPB (has no administration in time range)  tamsulosin (FLOMAX) capsule 0.4 mg (0.4 mg Oral Given 03/08/21 1138)  gabapentin (NEURONTIN) capsule 300 mg (300 mg Oral Given 03/08/21 1138)  albuterol (PROVENTIL) (2.5 MG/3ML) 0.083% nebulizer solution 3 mL (has no administration in time range)  mometasone-formoterol (DULERA) 200-5 MCG/ACT inhaler 2 puff (2 puffs Inhalation Given 03/08/21 1139)  umeclidinium bromide (INCRUSE ELLIPTA) 62.5 MCG/ACT 1 puff (1 puff Inhalation Given 03/08/21 1139)  aspirin EC tablet 81 mg (81 mg Oral Given 03/08/21 1138)  sodium chloride flush (NS) 0.9 % injection 3 mL (3 mLs Intravenous Given 03/08/21 1154)  acetaminophen (TYLENOL) tablet 650 mg (has no administration in time range)    Or  acetaminophen (TYLENOL) suppository 650 mg (has no  administration in time range)  polyethylene glycol (MIRALAX / GLYCOLAX) packet 17 g (has no administration in time range)  dexamethasone (DECADRON) tablet 6 mg (has no administration in time range)  insulin aspart (novoLOG) injection 0-9 Units (0 Units Subcutaneous Not Given 03/08/21 1216)  perflutren lipid microspheres (DEFINITY) IV suspension (4 mLs Intravenous Given 03/08/21 1137)  pantoprazole (PROTONIX) EC tablet 20 mg (has no administration in time range)  heparin bolus via infusion 4,500 Units (has no administration in time range)  heparin ADULT infusion 100 units/mL (25000 units/225mL) (has no administration in time range)  methylPREDNISolone sodium succinate (SOLU-MEDROL) 125 mg/2 mL injection 125 mg (125 mg Intravenous Given 03/08/21 1137)  sodium chloride 0.9 % bolus 500 mL (0 mLs Intravenous Stopped 03/08/21 1011)  albuterol (VENTOLIN HFA) 108 (90 Base) MCG/ACT inhaler 4 puff (4 puffs Inhalation Given 03/08/21 1138)    Mobility walks with device     Focused Assessments Cardiac Assessment Handoff:    Lab Results  Component Value Date   CKTOTAL 26 09/23/2013   CKMB 0.6 06/21/2010   TROPONINI <0.03 09/26/2017   Lab Results  Component Value Date   DDIMER 3.98 (H) 03/08/2021   Does the Patient currently have chest pain? No   , Pulmonary Assessment Handoff:  Lung sounds:   O2 Device: (S) Room Air O2 Flow Rate (L/min): 2 L/min    R Recommendations: See Admitting Provider Note

## 2021-03-08 NOTE — Progress Notes (Signed)
Pharmacy Antibiotic Note  Chris Payne is a 70 y.o. male admitted on 03/07/2021 with  COVID-19 .  Pharmacy has been consulted for Remdesivir dosing.  Serum creatinine 1.34, eGFR 57. AST slightly elevated, all other LFTs wnl. Patient appropriate for beginning remdesivir.   Plan: Give remdesivir IV $RemoveBefor'200mg'[id number]$  x1 11/12 Begin remdesivir IV $RemoveBefor'100mg'[id number]$  daily for 4 days total on 11/13    Temp (24hrs), Avg:98.5 F (36.9 C), Min:98.1 F (36.7 C), Max:98.8 F (37.1 C)  Recent Labs  Lab 03/07/21 1626  WBC 6.3  CREATININE 1.34*    CrCl cannot be calculated (Unknown ideal weight.).    No Known Allergies   Microbiology results: 11/12 Respiratory panel: COVID+    Thank you for allowing pharmacy to be a part of this patient's care.  Ardyth Harps, PharmD Clinical Pharmacist

## 2021-03-08 NOTE — Consult Note (Signed)
Hospital Consult    Reason for Consult:  left lower extremity swelling Referring Physician:  Dr. Evette Doffing MRN #:  [id number]  History of Present Illness: This is a 70 y.o. male history of abdominal aortic aneurysm repair with aortobifemoral bypass this was in 2011.  He was again seen by our office for carotid stenosis this has been a few years ago and has subsequently been lost to follow-up.  He presents today with increased weakness with known COVID-19 diagnosis.  Risk factors for vascular disease include coronary artery disease, hyperlipidemia, hypertension and he is a former smoker.  He denies any leg pain today but states that his left leg is more swollen than normal.  He does wear compression socks at home.  He does not take any blood thinners but he is on aspirin and a statin.  He does walk at home but states that he is limited to about 10 steps because of leg pain mostly in the back of his legs.  He denies any previous history of DVT.  Past Medical History:  Diagnosis Date   AAA (abdominal aortic aneurysm)    s/p repair 6/11   CAD (coronary artery disease)    s/p CABG 2/12:  LIMA to LAD, SVG to diagonal-1, SVG to ramus intermedius,, SVG to AM (Dr. Roxan Hockey) ;  b.  Myoview 10/13:  inf infarct with very mild peri-infarct ischemia, EF 49%, inf HK; c  He has severe three-vessel native coronary artery disease. Catheterization March 2015 as above   CAP (community acquired pneumonia) 02/23/2014   Carotid stenosis    a.  dopplers 2/12: LICA 24-82%;  b. Carotid dopplers 3/14:  R 0-39%, L 60-79%, repeat 6 mos   CHF (congestive heart failure) (HCC)    COPD (chronic obstructive pulmonary disease) (HCC)    GERD (gastroesophageal reflux disease)    Headache(784.0)    Hyperlipidemia    Hypertension    Hypoxia 02/23/2014   Inguinal hernia    Myocardial infarction [facility])    PCI x3   PAD (peripheral artery disease) ([place])    ABIs 4/12:  R 0.88, L 0.92; R SFA 40%, L CFA 50%, L SFA 50-60%    Pneumonia    hx   Stroke [facility])    h/o pontine CVA   Thyroid nodule    incidental finding on carotid doppler 3/14 => thyroid U/S ordered    Past Surgical History:  Procedure Laterality Date   ABDOMINAL AORTIC ANEURYSM REPAIR  10-03-2009   CORONARY ARTERY BYPASS GRAFT  06/24/2010   LIMA to the LAD, SVG to first diagonal, SVG to ramus intermediate, SVG to acute marginal. EF 50%. 2/12   EXPLORATION POST OPERATIVE OPEN HEART     EXPLORATORY LAPAROTOMY  09/2009   ligation of lumbar arteries   EXTERNAL EAR SURGERY Left    laceration child   HIP SURGERY  40 years ago   left hip bone removal and pinning   INGUINAL HERNIA REPAIR Left 07/21/2012   Procedure: HERNIA REPAIR INGUINAL ADULT;  Surgeon: Merrie Roof, MD;  Location: [place];  Service: General;  Laterality: Left;   INSERTION OF MESH Left 07/21/2012   Procedure: INSERTION OF MESH;  Surgeon: Merrie Roof, MD;  Location: [place];  Service: General;  Laterality: Left;   LEFT HEART CATHETERIZATION WITH CORONARY/GRAFT ANGIOGRAM  06/21/2012   Procedure: LEFT HEART CATHETERIZATION WITH Beatrix Fetters;  Surgeon: Minus Breeding, MD;  Location: [facility] C[facility] LAB;  Service: Cardiovascular;;   LEFT HEART CATHETERIZATION WITH  CORONARY/GRAFT ANGIOGRAM N/A 07/04/2013   Procedure: LEFT HEART CATHETERIZATION WITH Beatrix Fetters;  Surgeon: Sinclair Grooms, MD;  Location: [facility] CATH LAB;  Service: Cardiovascular;  Laterality: N/A;   NM MYOCAR PERF WALL MOTION  12/02/2010   inferoapical defect is fixed c/w prior infarction/scar, there is minimal borderzone ischemia.    No Known Allergies  Prior to Admission medications   Medication Sig Start Date End Date Taking? Authorizing Provider  albuterol (VENTOLIN HFA) 108 (90 Base) MCG/ACT inhaler Inhale 2 puffs into the lungs every 6 (six) hours as needed for wheezing or shortness of breath. 03/08/20  Yes [place] Donnamarie Poag, DO  aspirin EC 81 MG EC tablet Take 1 tablet (81 mg total) by mouth daily. 05/24/18   Yes Vann, Jessica U, DO  budesonide-formoterol (SYMBICORT) 160-4.5 MCG/ACT inhaler Inhale 2 puffs into the lungs 2 (two) times daily. 03/08/20  Yes [place] Eric J, DO  furosemide (LASIX) 20 MG tablet Take 1 tablet (20 mg total) by mouth daily. 02/10/20  Yes Deno Etienne, DO  gabapentin (NEURONTIN) 600 MG tablet Take 600 mg by mouth 2 (two) times daily. 11/13/20  Yes [provider]  metFORMIN (GLUCOPHAGE) 500 MG tablet Take 0.5 tablets (250 mg total) by mouth 2 (two) times daily. 03/08/20 03/08/21 Yes [place] Eric J, DO  metoprolol tartrate (LOPRESSOR) 25 MG tablet Take 1 tablet (25 mg total) by mouth 2 (two) times daily. 03/08/20 03/08/21 Yes [place] Eric J, DO  pantoprazole (PROTONIX) 40 MG tablet Take 40 mg by mouth daily. 02/19/21  Yes [provider]  rosuvastatin (CRESTOR) 40 MG tablet Take 1 tablet (40 mg total) by mouth daily. 03/08/20 03/08/21 Yes [place] Eric J, DO  tamsulosin (FLOMAX) 0.4 MG CAPS capsule Take 0.4 mg by mouth daily. 12/12/19  Yes [provider]  tiotropium (SPIRIVA HANDIHALER) 18 MCG inhalation capsule Place 1 capsule (18 mcg total) into inhaler and inhale daily. 03/08/20 03/08/21 Yes [place] Eric J, DO  triamcinolone cream (KENALOG) 0.1 % Apply 1 application topically 2 (two) times daily. 09/04/20  Yes Varney Biles, MD  doxycycline (VIBRAMYCIN) 100 MG capsule Take 1 capsule (100 mg total) by mouth 2 (two) times daily. Patient not taking: Reported on 03/08/2021 09/04/20   Varney Biles, MD  nystatin (MYCOSTATIN/NYSTOP) powder Apply topically 3 (three) times daily. Patient not taking: Reported on 03/08/2021 10/19/18   Patwardhan, Reynold Bowen, MD  sacubitril-valsartan (ENTRESTO) 24-26 MG Take 1 tablet by mouth every other day. Patient not taking: Reported on 03/08/2021 03/08/20   [place] Eric J, DO    Social History   Socioeconomic History   Marital status: Married    Spouse name: Rod Holler   Number of children: 0   Years of education: 10    Highest education level: Not on file  Occupational History   Occupation: disabled    Comment: back pain, hernia, CAD, AAA, Stroke  Tobacco Use   Smoking status: Former    Packs/day: 1.00    Years: 30.00    Pack years: 30.00    Types: Cigarettes    Quit date: 01/25/2010    Years since quitting: 11.1   Smokeless tobacco: Never   Tobacco comments:    "havent smoked in 7 years"  Vaping Use   Vaping Use: Never used  Substance and Sexual Activity   Alcohol use: No    Comment: former drinker - Quit 2011.   Drug use: No   Sexual activity: Not Currently  Other Topics Concern   Not on file  Social History Narrative   Marital status: married x 16 years; first marriage      Children:  None; 3 stepchildren; 4 grandchildren.  No gg.      Lives: with wife, Joslyn Hy and his girlfriend.      Employment: disability since 2010 CABG, AAA, CVA.  Previous furniture work.      Tobacco:  Quit in 2009; smoked 30 years.      Alcohol: quit in 2009; weekend drinking      ADLs: uses rolling walker within the home; has four pronged walker at home; has drivers license yet no car.  Cooks; no cleaning; no yard work.    Education 10th grade.   Right handed.   Caffeine five cups of coffee daily.      Advanced Directives:  DNR/DNI.     Social Determinants of Health   Financial Resource Strain: Not on file  Food Insecurity: Not on file  Transportation Needs: Not on file  Physical Activity: Not on file  Stress: Not on file  Social Connections: Not on file  Intimate Partner Violence: Not on file    Family History  Problem Relation Age of Onset   Alcohol abuse Father    Cancer Sister 84       unknown primary   Diabetes Mother    Hypertension Mother    Cancer Sister 25       kidney cancer    Review of Systems  Constitutional:  Positive for malaise/fatigue.  HENT: Negative.    Eyes: Negative.   Respiratory:  Positive for shortness of breath.   Cardiovascular:  Positive for leg swelling.   Gastrointestinal: Negative.   Musculoskeletal: Negative.   Neurological: Negative.   Endo/Heme/Allergies: Negative.   Psychiatric/Behavioral: Negative.       Physical Examination  Vitals:   03/08/21 1400 03/08/21 1445  BP: 139/73 125/78  Pulse: 66 74  Resp: 17 16  Temp:    SpO2: 94% 94%   Body mass index is 23.49 kg/m.  Physical Exam Constitutional:      Appearance: He is ill-appearing.  HENT:     Head: Normocephalic.     Nose: Nose normal.  Eyes:     Pupils: Pupils are equal, round, and reactive to light.  Cardiovascular:     Pulses:          Femoral pulses are 2+ on the right side and 2+ on the left side.      Popliteal pulses are 0 on the right side and 0 on the left side.     Comments: There is a monophasic right anterior tibial signal I cannot definitively hear a right posterior tibial signal.  On the left there are monophasic dorsalis pedis and anterior tibial signals Pulmonary:     Effort: Pulmonary effort is normal.  Abdominal:     General: Abdomen is flat.     Palpations: Abdomen is soft. There is no mass.     Comments: Well-healed midline incision  Musculoskeletal:     Right lower leg: No edema.     Left lower leg: Edema present.  Skin:    Capillary Refill: Capillary refill takes 2 to 3 seconds.     Comments: His feet are cool to the touch bilaterally  Neurological:     General: No focal deficit present.     Mental Status: He is alert.     Comments: Sensation and motor intact bilateral lower extremities  Psychiatric:        Mood  and Affect: Mood normal.        Thought Content: Thought content normal.        Judgment: Judgment normal.     CBC    Component Value Date/Time   WBC 4.8 03/08/2021 1251   RBC 5.32 03/08/2021 1251   HGB 17.6 (H) 03/08/2021 1251   HCT 51.5 03/08/2021 1251   PLT 104 (L) 03/08/2021 1251   MCV 96.8 03/08/2021 1251   MCV 101.0 (A) 02/08/2015 1300   MCH 33.1 03/08/2021 1251   MCHC 34.2 03/08/2021 1251   RDW 12.7  03/08/2021 1251   LYMPHSABS 0.7 03/08/2021 1251   MONOABS 0.3 03/08/2021 1251   EOSABS 0.0 03/08/2021 1251   BASOSABS 0.0 03/08/2021 1251    BMET    Component Value Date/Time   NA 134 (L) 03/08/2021 1251   NA 142 11/04/2018 1005   K 3.6 03/08/2021 1251   CL 99 03/08/2021 1251   CO2 23 03/08/2021 1251   GLUCOSE 114 (H) 03/08/2021 1251   BUN 22 03/08/2021 1251   BUN 15 11/04/2018 1005   CREATININE 1.71 (H) 03/08/2021 1251   CREATININE 0.94 05/24/2015 1149   CALCIUM 8.3 (L) 03/08/2021 1251   GFRNONAA 42 (L) 03/08/2021 1251   GFRNONAA >89 03/09/2014 1036   GFRAA >60 11/09/2019 1859   GFRAA >89 03/09/2014 1036    COAGS: Lab Results  Component Value Date   INR 1.1 03/08/2021   INR 1.07 04/16/2015   INR 1.10 10/31/2013     Non-Invasive Vascular Imaging:   ABI Findings:  +---------+------------------+-----+----------+--------+  Right    Rt Pressure (mmHg)IndexWaveform  Comment   +---------+------------------+-----+----------+--------+  Brachial 151                    triphasic           +---------+------------------+-----+----------+--------+  PTA      101               0.66 monophasic          +---------+------------------+-----+----------+--------+  DP       94                0.62 monophasic          +---------+------------------+-----+----------+--------+  Great Toe75                0.49                     +---------+------------------+-----+----------+--------+   +---------+------------------+-----+----------+-------+  Left     Lt Pressure (mmHg)IndexWaveform  Comment  +---------+------------------+-----+----------+-------+  Brachial 152                    triphasic          +---------+------------------+-----+----------+-------+  PTA      102               0.67 monophasic         +---------+------------------+-----+----------+-------+  DP       94                0.62 monophasic          +---------+------------------+-----+----------+-------+  Great Toe0                 0.00 Absent             +---------+------------------+-----+----------+-------+   +-------+-----------+-----------+------------+------------+  ABI/TBIToday's ABIToday's TBIPrevious ABIPrevious TBI  +-------+-----------+-----------+------------+------------+  Right  0.66       0.49                                 +-------+-----------+-----------+------------+------------+  Left   0.67       0.00                                 +-------+-----------+-----------+------------+------------+        ASSESSMENT/PLAN: This is a 70 y.o. male 70 year old male with previous history of aortobifemoral bypass with coolness of his left lower extremity and increased swelling relative to his usual amount of swelling.  ABIs are moderately decreased bilaterally although this appears to be a chronic process without any acute skin changes or sensorimotor changes to suggest acute limb ischemia.  Given that he does have increased edema of the left lower extremity from his usual level of edema and his current diagnosis of COVID-19 he is high risk for DVT.  DVT study has been ordered and he is being empirically treated with heparin.  Currently there is no role for vascular surgery in this complex patient but we will need to reestablish his care as an outpatient.  Hailey Miles C. Donzetta Matters, MD [facility] Office: [phone] Pager: [phone]

## 2021-03-08 NOTE — Progress Notes (Signed)
Went to reevaluate the patient. His left lower extremity is cooler than previously and more pale with significantly decreased capillary refill. LE is still swollen and difficult to appreciate a distal pulse. Performed bedside doppler with very faint DP and absent PT pulses. 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 I will start heparin gtt now. Stat ABIs pending. Will consult Vascular surgery depending on the result.     Lawerance Cruel, D.O.  Internal Medicine Resident, PGY-3 [facility]  Pager: [phone] 2:06 PM, 03/08/2021   **Please contact the on call pager after 5 pm and on weekends at [phone].**

## 2021-03-08 NOTE — Progress Notes (Signed)
*  PRELIMINARY RESULTS* Echocardiogram 2D Echocardiogram has been performed.  Chris Payne 03/08/2021, 11:54 AM

## 2021-03-08 NOTE — Progress Notes (Signed)
ABI and left lower extremity venous duplex completed. Refer to "CV Proc" under chart review to view preliminary results.  03/08/2021 3:33 PM Kelby Aline., MHA, RVT, RDCS, RDMS

## 2021-03-08 NOTE — ED Notes (Addendum)
Pt's O2 sat was at 87% upon arrival to exam room. Pt placed on 2 L [place] & is currently sating at 99%. RN & EDP notified.

## 2021-03-09 ENCOUNTER — Encounter (HOSPITAL_COMMUNITY): Payer: Self-pay | Admitting: Student in an Organized Health Care Education/Training Program

## 2021-03-09 ENCOUNTER — Inpatient Hospital Stay (HOSPITAL_COMMUNITY): Payer: Medicare Other

## 2021-03-09 DIAGNOSIS — J9601 Acute respiratory failure with hypoxia: Secondary | ICD-10-CM

## 2021-03-09 DIAGNOSIS — U071 COVID-19: Principal | ICD-10-CM

## 2021-03-09 DIAGNOSIS — N179 Acute kidney failure, unspecified: Secondary | ICD-10-CM

## 2021-03-09 LAB — CBC
HCT: 41.4 % (ref 39.0–52.0)
Hemoglobin: 14.1 g/dL (ref 13.0–17.0)
MCH: 33.2 pg (ref 26.0–34.0)
MCHC: 34.1 g/dL (ref 30.0–36.0)
MCV: 97.4 fL (ref 80.0–100.0)
Platelets: 114 10*3/uL — ABNORMAL LOW (ref 150–400)
RBC: 4.25 MIL/uL (ref 4.22–5.81)
RDW: 12.8 % (ref 11.5–15.5)
WBC: 6.2 10*3/uL (ref 4.0–10.5)
nRBC: 0 % (ref 0.0–0.2)

## 2021-03-09 LAB — D-DIMER, QUANTITATIVE: D-Dimer, Quant: 3.84 ug/mL-FEU — ABNORMAL HIGH (ref 0.00–0.50)

## 2021-03-09 LAB — SODIUM, URINE, RANDOM: Sodium, Ur: 21 mmol/L

## 2021-03-09 LAB — LIPID PANEL
Cholesterol: 85 mg/dL (ref 0–200)
HDL: 28 mg/dL — ABNORMAL LOW (ref >40)
LDL Cholesterol: 48 mg/dL (ref 0–99)
Total CHOL/HDL Ratio: 3 RATIO
Triglycerides: 44 mg/dL (ref <150)
VLDL: 9 mg/dL (ref 0–40)

## 2021-03-09 LAB — COMPREHENSIVE METABOLIC PANEL
ALT: 28 U/L (ref 0–44)
AST: 41 U/L (ref 15–41)
Albumin: 2.1 g/dL — ABNORMAL LOW (ref 3.5–5.0)
Alkaline Phosphatase: 65 U/L (ref 38–126)
Anion gap: 7 (ref 5–15)
BUN: 31 mg/dL — ABNORMAL HIGH (ref 8–23)
CO2: 24 mmol/L (ref 22–32)
Calcium: 7.8 mg/dL — ABNORMAL LOW (ref 8.9–10.3)
Chloride: 102 mmol/L (ref 98–111)
Creatinine, Ser: 2.11 mg/dL — ABNORMAL HIGH (ref 0.61–1.24)
GFR, Estimated: 33 mL/min — ABNORMAL LOW (ref >60)
Glucose, Bld: 103 mg/dL — ABNORMAL HIGH (ref 70–99)
Potassium: 3.7 mmol/L (ref 3.5–5.1)
Sodium: 133 mmol/L — ABNORMAL LOW (ref 135–145)
Total Bilirubin: 0.4 mg/dL (ref 0.3–1.2)
Total Protein: 4.9 g/dL — ABNORMAL LOW (ref 6.5–8.1)

## 2021-03-09 LAB — BASIC METABOLIC PANEL
Anion gap: 12 (ref 5–15)
BUN: 33 mg/dL — ABNORMAL HIGH (ref 8–23)
CO2: 21 mmol/L — ABNORMAL LOW (ref 22–32)
Calcium: 7.9 mg/dL — ABNORMAL LOW (ref 8.9–10.3)
Chloride: 99 mmol/L (ref 98–111)
Creatinine, Ser: 2.22 mg/dL — ABNORMAL HIGH (ref 0.61–1.24)
GFR, Estimated: 31 mL/min — ABNORMAL LOW (ref >60)
Glucose, Bld: 205 mg/dL — ABNORMAL HIGH (ref 70–99)
Potassium: 4.2 mmol/L (ref 3.5–5.1)
Sodium: 132 mmol/L — ABNORMAL LOW (ref 135–145)

## 2021-03-09 LAB — URINALYSIS, ROUTINE W REFLEX MICROSCOPIC
Bilirubin Urine: NEGATIVE
Glucose, UA: 50 mg/dL — AB
Ketones, ur: NEGATIVE mg/dL
Leukocytes,Ua: NEGATIVE
Nitrite: NEGATIVE
Protein, ur: 100 mg/dL — AB
Specific Gravity, Urine: 1.013 (ref 1.005–1.030)
pH: 5 (ref 5.0–8.0)

## 2021-03-09 LAB — GLUCOSE, CAPILLARY
Glucose-Capillary: 172 mg/dL — ABNORMAL HIGH (ref 70–99)
Glucose-Capillary: 127 mg/dL — ABNORMAL HIGH (ref 70–99)
Glucose-Capillary: 115 mg/dL — ABNORMAL HIGH (ref 70–99)
Glucose-Capillary: 195 mg/dL — ABNORMAL HIGH (ref 70–99)
Glucose-Capillary: 181 mg/dL — ABNORMAL HIGH (ref 70–99)
Glucose-Capillary: 295 mg/dL — ABNORMAL HIGH (ref 70–99)

## 2021-03-09 LAB — C-REACTIVE PROTEIN: CRP: 9.6 mg/dL — ABNORMAL HIGH (ref <1.0)

## 2021-03-09 LAB — FERRITIN: Ferritin: 478 ng/mL — ABNORMAL HIGH (ref 24–336)

## 2021-03-09 MED ORDER — LACTATED RINGERS IV SOLN: INTRAVENOUS | Status: AC

## 2021-03-09 MED ORDER — ENOXAPARIN SODIUM 30 MG/0.3ML IJ SOSY
30.0000 mg | PREFILLED_SYRINGE | INTRAMUSCULAR | Status: DC
  Administered 2021-03-09 – 2021-03-11 (×3): 30 mg via SUBCUTANEOUS
  Filled 2021-03-09 (×3): qty 0.3

## 2021-03-09 MED ORDER — ROSUVASTATIN CALCIUM 20 MG PO TABS
40.0000 mg | ORAL_TABLET | Freq: Every day | ORAL | Status: DC
  Administered 2021-03-09 – 2021-03-13 (×5): 40 mg via ORAL
  Filled 2021-03-09 (×5): qty 2

## 2021-03-09 NOTE — Plan of Care (Signed)
  Problem: Clinical Measurements: Goal: Ability to maintain clinical measurements within normal limits will improve Outcome: Progressing   Problem: Clinical Measurements: Goal: Respiratory complications will improve Outcome: Progressing   Problem: Clinical Measurements: Goal: Cardiovascular complication will be avoided Outcome: Progressing   Problem: Health Behavior/Discharge Planning: Goal: Ability to manage health-related needs will improve Outcome: Progressing   Problem: Education: Goal: Knowledge of General Education information will improve Description: Including pain rating scale, medication(s)/side effects and non-pharmacologic comfort measures Outcome: Progressing   Problem: Activity: Goal: Risk for activity intolerance will decrease Outcome: Progressing   Problem: Elimination: Goal: Will not experience complications related to urinary retention Outcome: Progressing   Problem: Pain Managment: Goal: General experience of comfort will improve Outcome: Progressing   Problem: Respiratory: Goal: Complications related to the disease process, condition or treatment will be avoided or minimized Outcome: Progressing

## 2021-03-09 NOTE — Evaluation (Signed)
Physical Therapy Evaluation Patient Details Name: Chris Payne MRN: [id number] DOB: [birth year] Today's Date: 03/09/2021  History of Present Illness  Pt is a 70 y.o. male who presented 03/07/21 with acute onset of SOB and recent diagnosis of COVID-19. PMH of COPD, HFrEFm CAD s/p CABG, Ruptured AAA s/p repair, CVA, HTN, Prediabetes   Clinical Impression  Pt presents with condition above and deficits mentioned below, see PT Problem List. PTA, he was mod I for stand pivot transfers, ambulating up to ~10 ft with a RW, but otherwise performing majority of mobility with his w/c. Pt lives with his family on the main level of the house with 1 STE. Pt has had several falls in the past few days prior to admission. Currently, pt displays deficits in L lower extremity ROM likely due to significant edema noted, bil lower extremity weakness (L>R), balance deficits, and decreased activity tolerance. He is at high risk for subsequent falls, requiring modA for bed mobility and modAx2 for all transfers at this time. Pt would benefit from a short-term rehab stay at a SNF to maximize his independence and safety with all functional mobility. Will continue to follow acutely.       Recommendations for follow up therapy are one component of a multi-disciplinary discharge planning process, led by the attending physician.  Recommendations may be updated based on patient status, additional functional criteria and insurance authorization.  Follow Up Recommendations Skilled nursing-short term rehab (<3 hours/day)    Assistance Recommended at Discharge Intermittent Supervision/Assistance (for all mobility)  Functional Status Assessment Patient has had a recent decline in their functional status and demonstrates the ability to make significant improvements in function in a reasonable and predictable amount of time.  Equipment Recommendations  None recommended by PT    Recommendations for Other Services       Precautions  / Restrictions Precautions Precautions: Fall;Other (comment) Precaution Comments: COVID+, monitor SpO2 Restrictions Weight Bearing Restrictions: No      Mobility  Bed Mobility Overal bed mobility: Needs Assistance Bed Mobility: Supine to Sit     Supine to sit: Mod assist;+2 for safety/equipment;HOB elevated     General bed mobility comments: Pt requiring extra time to manage legs off EOB and transition to propped up on L elbow to exit L EOB with HOB elevated before needing physical assistance to complete the transition. ModA to complete trunk ascension and scoot hips to EOB with use of bed pad.    Transfers Overall transfer level: Needs assistance Equipment used: Rolling walker (2 wheels) Transfers: Sit to/from Stand;Bed to chair/wheelchair/BSC Sit to Stand: Mod assist;+2 physical assistance;+2 safety/equipment;From elevated surface   Step pivot transfers: Mod assist;+2 physical assistance;+2 safety/equipment       General transfer comment: Pt requiring modAx2 to steady, extend trunk, and extend hips to come to stand and maintain with stand step pivot to L bed > recliner with guidance at hips.    Ambulation/Gait               General Gait Details: Unable other than a small step to transfer.  Stairs            Wheelchair Mobility    Modified Rankin (Stroke Patients Only)       Balance Overall balance assessment: Needs assistance Sitting-balance support: No upper extremity supported;Feet supported Sitting balance-Leahy Scale: Fair Sitting balance - Comments: Able to reach min off BOS to mid-lower leg with supervision.   Standing balance support: Reliant on assistive device for balance Standing  balance-Leahy Scale: Poor Standing balance comment: ModAx2 and bil UE support to stand.                             Pertinent Vitals/Pain Pain Assessment: Faces Faces Pain Scale: Hurts little more Pain Location: L knee and leg Pain Descriptors /  Indicators: Discomfort;Grimacing Pain Intervention(s): Limited activity within patient's tolerance;Monitored during session;Repositioned    Home Living Family/patient expects to be discharged to:: Private residence Living Arrangements: Spouse/significant other;Children (adult son and his gf) Available Help at Discharge: Family;Available 24 hours/day Type of Home: House Home Access: Stairs to enter Entrance Stairs-Rails: None Entrance Stairs-Number of Steps: 1   Home Layout: Two level;Able to live on main level with bedroom/bathroom (doesnt go upstairs) Home Equipment: Rolling Walker (2 wheels);Wheelchair - manual;Cane - single point;Other (comment) (day bed)      Prior Function Prior Level of Function : Needs assist           ADLs (physical): Bathing;Dressing;IADLs Mobility Comments: Mod I with stand pivot transfers, w/c mobility at baseline. Able to walk up to ~10 ft with RW before starting to fall a couple days ago. ADLs Comments: Someone assists in cleaning his back and dressing his lower half.     Hand Dominance        Extremity/Trunk Assessment   Upper Extremity Assessment Upper Extremity Assessment: Defer to OT evaluation    Lower Extremity Assessment Lower Extremity Assessment: Generalized weakness;LLE deficits/detail LLE Deficits / Details: Significant edema and erythema noted in lower leg/foot, limiting knee and ankle ROM; increased weakness with knee extension and ankle dorsiflexion noted on L compared to R with MMT scores of knee extension 4L 4+R, ankle dorsiflexion 4-L 4+R; denies numbness/tingling bil    Cervical / Trunk Assessment Cervical / Trunk Assessment: Normal  Communication   Communication: No difficulties  Cognition Arousal/Alertness: Awake/alert Behavior During Therapy: WFL for tasks assessed/performed Overall Cognitive Status: Within Functional Limits for tasks assessed                                 General Comments: Pt very  pleasant        General Comments General comments (skin integrity, edema, etc.): SpO2 as low as 88% on RA when mobilizing but sustaining >/= 92% on RA at rest, notified RN that [place] doffed end of session    Exercises     Assessment/Plan    PT Assessment Patient needs continued PT services  PT Problem List Decreased strength;Decreased range of motion;Decreased activity tolerance;Decreased balance;Decreased mobility;Cardiopulmonary status limiting activity;Decreased skin integrity       PT Treatment Interventions DME instruction;Gait training;Stair training;Functional mobility training;Therapeutic activities;Therapeutic exercise;Balance training;Neuromuscular re-education;Patient/family education;Wheelchair mobility training    PT Goals (Current goals can be found in the Care Plan section)  Acute Rehab PT Goals Patient Stated Goal: to get back to his normal PT Goal Formulation: With patient Time For Goal Achievement: 03/23/21 Potential to Achieve Goals: Good    Frequency Min 2X/week   Barriers to discharge        Co-evaluation PT/OT/SLP Co-Evaluation/Treatment: Yes Reason for Co-Treatment: For patient/therapist safety;To address functional/ADL transfers PT goals addressed during session: Mobility/safety with mobility;Balance;Proper use of DME         AM-PAC PT "6 Clicks" Mobility  Outcome Measure Help needed turning from your back to your side while in a flat bed without using bedrails?: A Lot  Help needed moving from lying on your back to sitting on the side of a flat bed without using bedrails?: A Lot Help needed moving to and from a bed to a chair (including a wheelchair)?: Total Help needed standing up from a chair using your arms (e.g., wheelchair or bedside chair)?: Total Help needed to walk in hospital room?: Total Help needed climbing 3-5 steps with a railing? : Total 6 Click Score: 8    End of Session Equipment Utilized During Treatment: Oxygen Activity  Tolerance: Patient tolerated treatment well Patient left: in chair;with call bell/phone within reach;with chair alarm set;with family/visitor present Nurse Communication: Mobility status;Other (comment) (sats) PT Visit Diagnosis: Unsteadiness on feet (R26.81);Other abnormalities of gait and mobility (R26.89);Muscle weakness (generalized) (M62.81);History of falling (Z91.81);Repeated falls (R29.6);Difficulty in walking, not elsewhere classified (R26.2)    Time: 1020-1057 PT Time Calculation (min) (ACUTE ONLY): 37 min   Charges:   PT Evaluation $PT Eval Moderate Complexity: 1 Mod          Moishe Spice, PT, DPT Acute Rehabilitation Services  Pager: [phone] Office: [phone]   Orvan Falconer 03/09/2021, 11:14 AM

## 2021-03-09 NOTE — Evaluation (Signed)
Occupational Therapy Evaluation Patient Details Name: Chris Payne MRN: [id number] DOB: [birth year] Today's Date: 03/09/2021   History of Present Illness Pt is a 70 y.o. male who presented 03/07/21 with acute onset of SOB and recent diagnosis of COVID-19. PMH of COPD, HFrEFm CAD s/p CABG, Ruptured AAA s/p repair, CVA, HTN, Prediabetes   Clinical Impression   Pt is typically mod I with RW/WC for in home mobility. He sponge bathes at baseline and gets assist for LB ADL (especially dressing). Today he is very pleasant, wants to maximize independence and motivated. He was mod A +2 for bed mobility, able to sit EOB at min guard for participation in grooming tasks.  Pt is mod A +2 for transfers at this time including toilet transfer, increased assist for peri care and clothing management. Recommending SNf post-acute to maximize safety and independence in ADL and functional transfers to return to PLOF. OT will continue to follow acutely.     Recommendations for follow up therapy are one component of a multi-disciplinary discharge planning process, led by the attending physician.  Recommendations may be updated based on patient status, additional functional criteria and insurance authorization.   Follow Up Recommendations  Skilled nursing-short term rehab (<3 hours/day)    Assistance Recommended at Discharge Intermittent Supervision/Assistance  Functional Status Assessment  Patient has had a recent decline in their functional status and demonstrates the ability to make significant improvements in function in a reasonable and predictable amount of time.  Equipment Recommendations  BSC/3in1;Other (comment) (defer to next venue of care)    Recommendations for Other Services PT consult     Precautions / Restrictions Precautions Precautions: Fall;Other (comment) Precaution Comments: COVID+, monitor SpO2 Restrictions Weight Bearing Restrictions: No      Mobility Bed Mobility Overal bed  mobility: Needs Assistance Bed Mobility: Supine to Sit     Supine to sit: Mod assist;+2 for safety/equipment;HOB elevated     General bed mobility comments: Pt requiring extra time to manage legs off EOB and transition to propped up on L elbow to exit L EOB with HOB elevated before needing physical assistance to complete the transition. ModA to complete trunk ascension and scoot hips to EOB with use of bed pad.    Transfers Overall transfer level: Needs assistance Equipment used: Rolling walker (2 wheels) Transfers: Sit to/from Stand;Bed to chair/wheelchair/BSC Sit to Stand: Mod assist;+2 physical assistance;+2 safety/equipment;From elevated surface     Step pivot transfers: Mod assist;+2 physical assistance;+2 safety/equipment     General transfer comment: Pt requiring modAx2 to steady, extend trunk, and extend hips to come to stand and maintain with stand step pivot to L bed > recliner with guidance at hips.      Balance Overall balance assessment: Needs assistance Sitting-balance support: No upper extremity supported;Feet supported Sitting balance-Leahy Scale: Fair Sitting balance - Comments: Able to reach min off BOS to mid-lower leg with supervision.   Standing balance support: Reliant on assistive device for balance Standing balance-Leahy Scale: Poor Standing balance comment: ModAx2 and bil UE support to stand.                           ADL either performed or assessed with clinical judgement   ADL Overall ADL's : Needs assistance/impaired Eating/Feeding: Set up   Grooming: Wash/dry hands;Wash/dry face;Set up;Sitting Grooming Details (indicate cue type and reason): in recliner Upper Body Bathing: Moderate assistance;Sitting Upper Body Bathing Details (indicate cue type and reason): for back Lower  Body Bathing: Moderate assistance Lower Body Bathing Details (indicate cue type and reason): for knees down Upper Body Dressing : Set up   Lower Body Dressing:  Maximal assistance Lower Body Dressing Details (indicate cue type and reason): to don socks Toilet Transfer: Moderate assistance;+2 for physical assistance;+2 for safety/equipment;Stand-pivot;Rolling walker (2 wheels);BSC/3in1   Toileting- Clothing Manipulation and Hygiene: Maximal assistance;+2 for physical assistance;+2 for safety/equipment;Sit to/from stand Toileting - Clothing Manipulation Details (indicate cue type and reason): Pt able to maintain short bout of standing for peri care     Functional mobility during ADLs: Moderate assistance;+2 for physical assistance;+2 for safety/equipment;Rolling walker (2 wheels) General ADL Comments: decreased balance, activity tolerance, impacted by edema     Vision Patient Visual Report: No change from baseline       Perception     Praxis      Pertinent Vitals/Pain Pain Assessment: Faces Faces Pain Scale: Hurts little more Pain Location: L knee and leg Pain Descriptors / Indicators: Discomfort;Grimacing Pain Intervention(s): Limited activity within patient's tolerance;Monitored during session;Repositioned;Other (comment) (elevation of limb for edema management)     Hand Dominance Right   Extremity/Trunk Assessment Upper Extremity Assessment Upper Extremity Assessment: Generalized weakness   Lower Extremity Assessment Lower Extremity Assessment: Defer to PT evaluation LLE Deficits / Details: Significant edema and erythema noted in lower leg/foot   Cervical / Trunk Assessment Cervical / Trunk Assessment: Normal   Communication Communication Communication: No difficulties   Cognition Arousal/Alertness: Awake/alert Behavior During Therapy: WFL for tasks assessed/performed Overall Cognitive Status: Within Functional Limits for tasks assessed                                 General Comments: Pt very pleasant     General Comments  SpO2 as low as 88% on RA when mobilizing but sustaining >/= 92% on RA at rest,  notified RN that [place] doffed end of session    Exercises     Shoulder Instructions      Home Living Family/patient expects to be discharged to:: Private residence Living Arrangements: Spouse/significant other;Children (adult son and his gf) Available Help at Discharge: Family;Available 24 hours/day Type of Home: House Home Access: Stairs to enter [employer] of Steps: 1 Entrance Stairs-Rails: None Home Layout: Two level;Able to live on main level with bedroom/bathroom (doesnt go upstairs)     Bathroom Shower/Tub: Sponge bathes at baseline;Tub/shower unit     Bathroom Accessibility: Yes (by w/c)   Home Equipment: Rolling Walker (2 wheels);Wheelchair - manual;Cane - single point;Other (comment) (day bed)          Prior Functioning/Environment Prior Level of Function : Needs assist       Physical Assist : ADLs (physical);Mobility (physical) Mobility (physical): Gait;Stairs ADLs (physical): Bathing;Dressing;IADLs (especially lower half) Mobility Comments: Mod I with stand pivot transfers, w/c mobility at baseline. Able to walk up to ~10 ft with RW before starting to fall a couple days ago. ADLs Comments: Someone assists in cleaning his back and dressing his lower half., sponge bathes at baseline        OT Problem List: Decreased strength;Decreased activity tolerance;Impaired balance (sitting and/or standing);Decreased safety awareness;Decreased knowledge of use of DME or AE;Decreased knowledge of precautions;Cardiopulmonary status limiting activity;Pain;Increased edema      OT Treatment/Interventions: Self-care/ADL training;Therapeutic exercise;DME and/or AE instruction;Therapeutic activities;Patient/family education;Balance training    OT Goals(Current goals can be found in the care plan section) Acute Rehab OT Goals Patient  Stated Goal: TO get stronger again OT Goal Formulation: With patient Time For Goal Achievement: 03/23/21 Potential to Achieve Goals:  Good ADL Goals Pt Will Perform Grooming: with modified independence;sitting Pt Will Perform Upper Body Dressing: with modified independence;sitting Pt Will Perform Lower Body Dressing: sitting/lateral leans;with min assist;with caregiver independent in assisting Pt Will Transfer to Toilet: with min assist;stand pivot transfer;bedside commode Pt Will Perform Toileting - Clothing Manipulation and hygiene: with min guard assist;with caregiver independent in assisting;sitting/lateral leans Additional ADL Goal #1: Pt will recall 3 ways of conserving energy during ADL routine with no cues  OT Frequency: Min 2X/week   Barriers to D/C:            Co-evaluation PT/OT/SLP Co-Evaluation/Treatment: Yes Reason for Co-Treatment: For patient/therapist safety;To address functional/ADL transfers PT goals addressed during session: Mobility/safety with mobility;Balance;Proper use of DME;Strengthening/ROM        AM-PAC OT "6 Clicks" Daily Activity     Outcome Measure Help from another person eating meals?: None Help from another person taking care of personal grooming?: A Little Help from another person toileting, which includes using toliet, bedpan, or urinal?: A Lot Help from another person bathing (including washing, rinsing, drying)?: A Lot Help from another person to put on and taking off regular upper body clothing?: A Little Help from another person to put on and taking off regular lower body clothing?: A Lot 6 Click Score: 16   End of Session Equipment Utilized During Treatment: Rolling walker (2 wheels);Gait belt Nurse Communication: Mobility status;Precautions  Activity Tolerance: Patient tolerated treatment well Patient left: in chair;with call bell/phone within reach;with chair alarm set  OT Visit Diagnosis: Unsteadiness on feet (R26.81);Other abnormalities of gait and mobility (R26.89);Muscle weakness (generalized) (M62.81)                Time: 9528-4132 OT Time Calculation (min):  38 min Charges:  OT General Charges $OT Visit: 1 Visit OT Evaluation $OT Eval Moderate Complexity: [place] OTR/L Acute Rehabilitation Services Pager: [phone] Office: [place] 03/09/2021, 3:43 PM

## 2021-03-09 NOTE — Progress Notes (Addendum)
HD#1 Subjective:  Overnight Events: None  Patient seen and assessed this morning at bedside.  Patient reports doing okay overall.  Patient still has very congested cough.  Patient reports breathing has improved since admission.  Discussed plan today to ultrasound kidneys in order to assess for signs of intrarenal injury.  Discussed that we continue to support his COVID infection.  Objective:  Vital signs in last 24 hours: Vitals:   03/08/21 2000 03/08/21 2100 03/09/21 0000 03/09/21 0421  BP:  114/74 117/70 117/73  Pulse:  64 63 65  Resp:  13 12 17   Temp: 97.8 F (36.6 C)  97.6 F (36.4 C) 97.8 F (36.6 C)  TempSrc: Axillary  Oral Oral  SpO2:  93% 94% 96%  Weight:    74.1 kg  Height:       Supplemental O2: Nasal Cannula SpO2: 96 % O2 Flow Rate (L/min): 2 L/min   Physical Exam:  Physical Exam Constitutional:      Appearance: He is ill-appearing.  Cardiovascular:     Rate and Rhythm: Normal rate and regular rhythm.     Heart sounds: Normal heart sounds.  Pulmonary:     Breath sounds: Wheezing present.  Abdominal:     Palpations: Abdomen is soft.  Musculoskeletal:     Right lower leg: Edema present.     Left lower leg: Edema present.  Neurological:     General: No focal deficit present.     Mental Status: He is alert and oriented to person, place, and time.    Filed Weights   03/08/21 1025 03/08/21 1642 03/09/21 0421  Weight: 68 kg 74.2 kg 74.1 kg     Intake/Output Summary (Last 24 hours) at 03/09/2021 2947 Last data filed at 03/09/2021 0600 Gross per 24 hour  Intake 747.04 ml  Output 700 ml  Net 47.04 ml   Net IO Since Admission: 47.04 mL [03/09/21 0649]   Assessment/Plan:   Principal Problem:   COVID-19 Active Problems:   COPD (chronic obstructive pulmonary disease) (HCC)   PAD (peripheral artery disease) (HCC)   Acute kidney injury ([place])   Patient Summary:  Chris Payne is a 70 y.o. with pertinent PMH of COPD, HFrEFm CAD s/p CABG,  Ruptured AAA s/p repair, CVA, HTN, Prediabetes who presented with Shortness of breath and admit for acute hypoxic respiratory failure on hospital day 1   Plan: #COVID-19 with Hypoxia: Patient presents with shortness of breath and new oxygen requirement of 2 L nasal cannula. He tested positive for COVID-19, but remains afebrile without leukocytosis or systemic signs of infection.  Checks x-ray was within normal limits without any significant signs of consolidations.  He does have a significant history of COPD with multiple admissions for COPD exacerbations in the past which could be contributing to his presenting symptoms.- Fibrinogen 473 - Dexamethasone 6mg  x 10 days - Little benefit to remdesivir given time course - No toci or baricitinib given low supplemental oxygen requirement   #Left Lower Extremity Swelling and Pain #PAD Patient presents with new left lower extremity swelling out of proportion to his right lower extremity.  He states that he recently developed pain in his calf this week with positive Homans' sign.  His lower extremity is cool to the touch with decreased pulses.  He did test positive for COVID which raises his risk for VTE.  Well score shows moderate to high risk for DVT. I will get Doppler ultrasound of his left lower extremity today. He does have  a significant history of PAD and may need repeat ABIs during this hospitalization. - Heparin infusion discontinued - Vascular surgery consulted and recommend outpatient establishment of care to manage vascular condition - Continue aspirin and rosuvastatin  AKI: Patient presents with an acute elevation in his creatinine from a baseline of 0.95-1.34 with a GFR of 57.  He does endorse decreased p.o. intake over the last week raising concern for possible prerenal azotemia.  However he does have a BUN/creatinine ratio of 10 raising concern for intrarenal or postrenal azotemia. He does have a history of BPH on tamsulosin.  - IV fluids  today with LR at 167ml/hr - Renal ultrasound   #COPD Patient does present with symptoms concerning for possible COPD exacerbation with 2/3 cardinal symptoms. Home meds include albuterol, Symbicort, and Spiriva. No previous PFTs.  -Decadron 6 mg daily - Continue home meds of albuterol, Dulera, and Incruse Ellipta   #HFpEF (EF of 55-60%) #Ischemic Cardiomyopathy: Patient had a stress test performed in 2020 which showed a mildly reduced EF of 40%. His home meds include furosemide 20 mg daily, metoprolol 25 mg BID, and Entresto 24-26 mg  every other day, but he states that he is only taking the metoprolol and is not on any diuretics at home. He does not have a recent Echo on file. Appears hypovolemic on exam.  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 His home meds include ASA 81 mg daily and rosuvastatin 40 mg daily. Spoke with patient's son who states that they he was recently evaluated for possible carotid aneurysm in the past month.  He was unable to provide significant information regarding this visit.  He does have a significant history of carotid stenosis left greater than right.  We will consider further evaluation of this in the near future but currently asymptomatic with no chest pain, palpitations, or altered mental status.  Additionally, he has cool lower extremities with a history of peripheral artery disease.  Will likely need ABIs during this hospitalization. - Cont ASA and rosuvastatin   #Prediabetes: His last a1c was 6.0 a year ago. He is currently on metformin 250 mg BID.  Will likely have hyperglycemia in the setting of steroid use. -CBG  monitoring -SSI   #Degenerative Disk Disease #Chronic Pain Syndrome: -Gabapentin 300 mg BID.    #BPH: -Tamsulosin 0.4 mg daily.    GERD: - continue PPI daily.   Decubitus Pressure Wound, Grade 1-2 - Sacral pressure pad to offload his sacrum  Diet: Heart Healthy IVF: LR,100cc/hr VTE: Enoxaparin Code: Full PT/OT recs: SNF for Subacute PT, none. TOC recs: pending   Dispo: Anticipated discharge to Skilled nursing facility in 5 days pending improved breathing and appropriate management of COVID symptoms.   France Ravens, MD 03/09/2021, 6:49 AM Pager: [phone]  Please contact the on call pager after 5 pm and on weekends at [phone].

## 2021-03-10 ENCOUNTER — Inpatient Hospital Stay (HOSPITAL_COMMUNITY): Payer: Medicare Other

## 2021-03-10 DIAGNOSIS — M25462 Effusion, left knee: Secondary | ICD-10-CM

## 2021-03-10 DIAGNOSIS — N401 Enlarged prostate with lower urinary tract symptoms: Secondary | ICD-10-CM

## 2021-03-10 DIAGNOSIS — G894 Chronic pain syndrome: Secondary | ICD-10-CM

## 2021-03-10 DIAGNOSIS — I739 Peripheral vascular disease, unspecified: Secondary | ICD-10-CM

## 2021-03-10 DIAGNOSIS — M519 Unspecified thoracic, thoracolumbar and lumbosacral intervertebral disc disorder: Secondary | ICD-10-CM

## 2021-03-10 DIAGNOSIS — K219 Gastro-esophageal reflux disease without esophagitis: Secondary | ICD-10-CM

## 2021-03-10 DIAGNOSIS — E785 Hyperlipidemia, unspecified: Secondary | ICD-10-CM

## 2021-03-10 DIAGNOSIS — I5030 Unspecified diastolic (congestive) heart failure: Secondary | ICD-10-CM

## 2021-03-10 DIAGNOSIS — I255 Ischemic cardiomyopathy: Secondary | ICD-10-CM

## 2021-03-10 DIAGNOSIS — N179 Acute kidney failure, unspecified: Secondary | ICD-10-CM

## 2021-03-10 DIAGNOSIS — R7303 Prediabetes: Secondary | ICD-10-CM

## 2021-03-10 DIAGNOSIS — J9601 Acute respiratory failure with hypoxia: Secondary | ICD-10-CM

## 2021-03-10 DIAGNOSIS — J440 Chronic obstructive pulmonary disease with acute lower respiratory infection: Secondary | ICD-10-CM

## 2021-03-10 DIAGNOSIS — R338 Other retention of urine: Secondary | ICD-10-CM

## 2021-03-10 DIAGNOSIS — I25110 Atherosclerotic heart disease of native coronary artery with unstable angina pectoris: Secondary | ICD-10-CM

## 2021-03-10 LAB — BASIC METABOLIC PANEL
Anion gap: 8 (ref 5–15)
BUN: 32 mg/dL — ABNORMAL HIGH (ref 8–23)
CO2: 23 mmol/L (ref 22–32)
Calcium: 7.6 mg/dL — ABNORMAL LOW (ref 8.9–10.3)
Chloride: 100 mmol/L (ref 98–111)
Creatinine, Ser: 2.1 mg/dL — ABNORMAL HIGH (ref 0.61–1.24)
GFR, Estimated: 33 mL/min — ABNORMAL LOW (ref >60)
Glucose, Bld: 191 mg/dL — ABNORMAL HIGH (ref 70–99)
Potassium: 3.5 mmol/L (ref 3.5–5.1)
Sodium: 131 mmol/L — ABNORMAL LOW (ref 135–145)

## 2021-03-10 LAB — C-REACTIVE PROTEIN: CRP: 11.6 mg/dL — ABNORMAL HIGH (ref <1.0)

## 2021-03-10 LAB — FERRITIN: Ferritin: 515 ng/mL — ABNORMAL HIGH (ref 24–336)

## 2021-03-10 LAB — GLUCOSE, CAPILLARY
Glucose-Capillary: 154 mg/dL — ABNORMAL HIGH (ref 70–99)
Glucose-Capillary: 164 mg/dL — ABNORMAL HIGH (ref 70–99)
Glucose-Capillary: 200 mg/dL — ABNORMAL HIGH (ref 70–99)
Glucose-Capillary: 207 mg/dL — ABNORMAL HIGH (ref 70–99)
Glucose-Capillary: 235 mg/dL — ABNORMAL HIGH (ref 70–99)

## 2021-03-10 LAB — CBC
HCT: 40.1 % (ref 39.0–52.0)
Hemoglobin: 13.9 g/dL (ref 13.0–17.0)
MCH: 33.5 pg (ref 26.0–34.0)
MCHC: 34.7 g/dL (ref 30.0–36.0)
MCV: 96.6 fL (ref 80.0–100.0)
Platelets: 128 10*3/uL — ABNORMAL LOW (ref 150–400)
RBC: 4.15 MIL/uL — ABNORMAL LOW (ref 4.22–5.81)
RDW: 12.7 % (ref 11.5–15.5)
WBC: 5.3 10*3/uL (ref 4.0–10.5)
nRBC: 0 % (ref 0.0–0.2)

## 2021-03-10 LAB — URINALYSIS, ROUTINE W REFLEX MICROSCOPIC
Bacteria, UA: NONE SEEN
Bilirubin Urine: NEGATIVE
Glucose, UA: 50 mg/dL — AB
Ketones, ur: NEGATIVE mg/dL
Leukocytes,Ua: NEGATIVE
Nitrite: NEGATIVE
Protein, ur: 30 mg/dL — AB
Specific Gravity, Urine: 1.011 (ref 1.005–1.030)
pH: 7 (ref 5.0–8.0)

## 2021-03-10 LAB — D-DIMER, QUANTITATIVE: D-Dimer, Quant: 2.85 ug/mL-FEU — ABNORMAL HIGH (ref 0.00–0.50)

## 2021-03-10 MED ORDER — INSULIN ASPART 100 UNIT/ML IJ SOLN
0.0000 [IU] | Freq: Three times a day (TID) | INTRAMUSCULAR | Status: DC
Start: 2021-03-10 — End: 2021-03-14
  Administered 2021-03-10 – 2021-03-11 (×3): 1 [IU] via SUBCUTANEOUS
  Administered 2021-03-12: 2 [IU] via SUBCUTANEOUS
  Administered 2021-03-12: 1 [IU] via SUBCUTANEOUS
  Administered 2021-03-12: 2 [IU] via SUBCUTANEOUS
  Administered 2021-03-13: 06:00:00 1 [IU] via SUBCUTANEOUS
  Administered 2021-03-13: 12:00:00 2 [IU] via SUBCUTANEOUS
  Administered 2021-03-13: 1 [IU] via SUBCUTANEOUS

## 2021-03-10 MED ORDER — INSULIN ASPART 100 UNIT/ML IJ SOLN
2.0000 [IU] | Freq: Three times a day (TID) | INTRAMUSCULAR | Status: DC
  Administered 2021-03-10 – 2021-03-11 (×4): 2 [IU] via SUBCUTANEOUS

## 2021-03-10 MED ORDER — CEPHALEXIN 250 MG PO CAPS
500.0000 mg | ORAL_CAPSULE | Freq: Four times a day (QID) | ORAL | Status: DC
  Administered 2021-03-10 – 2021-03-13 (×12): 500 mg via ORAL
  Filled 2021-03-10 (×11): qty 2

## 2021-03-10 NOTE — Progress Notes (Signed)
   03/10/21 0509  Urine Characteristics  Urinary Incontinence No  Urine Color Yellow/straw (with some old blood streaks - Informed Rehman, DO)  Urine Appearance Clear  Urinary Interventions Intermittent/Straight cath (Patient voided with 100 ml on urinal. Verbalized that his bladder is still full.)  Bladder Scan Volume (mL) 750 mL  Intermittent/Straight Cath (mL) 700 mL (Inserted aseptically. Inserted smoothly. No resistance)  Intermittent Catheter Size 16  Hygiene Peri care

## 2021-03-10 NOTE — Progress Notes (Signed)
Inpatient Diabetes Program Recommendations  AACE/ADA: New Consensus Statement on Inpatient Glycemic Control (2015)  Target Ranges:  Prepandial:   less than 140 mg/dL      Peak postprandial:   less than 180 mg/dL (1-2 hours)      Critically ill patients:  140 - 180 mg/dL   Lab Results  Component Value Date   GLUCAP 164 (H) 03/10/2021   HGBA1C 6.1 (H) 03/08/2021    Review of Glycemic Control Results for NALIN, MAZZOCCO (MRN [id number]) as of 03/10/2021 10:44  Ref. Range 03/09/2021 20:28 03/10/2021 00:20 03/10/2021 04:10 03/10/2021 08:08  Glucose-Capillary Latest Ref Range: 70 - 99 mg/dL 295 (H) 235 (H) 200 (H) 164 (H)   Diabetes history: PreDM Outpatient Diabetes medications: Metformin 250 mg BID Current orders for Inpatient glycemic control: Novolog 0-9 units Q4H Decadron 6 mg QD Inpatient Diabetes Program Recommendations:    Now that patient has diet order, consider: -Change correction to Novolog 0-6 units TID & HS -Novolog 2 units TID (assuming patient is consuming >50% of meals; to be continued with steroids)  Thanks, Bronson Curb, MSN, RNC-OB Diabetes Coordinator [phone] (8a-5p)

## 2021-03-10 NOTE — Hospital Course (Addendum)
#COVID-19 with Hypoxia: Patient presents with shortness of breath and new oxygen requirement of 2 L nasal cannula. He tested positive for COVID-19, but remains afebrile without leukocytosis or systemic signs of infection.  Checks x-ray was within normal limits without any significant signs of consolidations.  He does have a significant history of COPD with multiple admissions for COPD exacerbations in the past which could be contributing to his presenting symptoms. - Fibrinogen 473 > 515 > 555 - Dexamethasone 6mg  x 10 days (Day 3) - Little benefit to remdesivir given time course - No toci or baricitinib given low supplemental oxygen requirement   #Left Knee Effusion Patient reports having left knee swelling and pain upon palpation.  Patient reports having hit head against a wall. -10 cc bloody fluid aspirated from knee, patient reports relief of knee pain   #Cellulitis #Left Lower Extremity Swelling, erythema, and pain #PAD Patient presents with new left lower extremity swelling out of proportion to his right lower extremity.   - U/S showed no DVT - ABIs with moderate bilateral PVD and absent left great toe waveform on the left  - Vascular surgery consulted and recommend outpatient follow up - Continue aspirin and rosuvastatin for secondary prevention  - Concern for superimposed cellulitis today given increased warmth and erythema compared to the RLE. Will start 5 day course of keflex.    #AKI #Urinary retention possibly 2/2 BPH Patient presents with an acute elevation in his creatinine from a baseline of 0.95-1.34 with a GFR of 57.  He does endorse decreased p.o. intake over the last week raising concern for possible prerenal azotemia.  However he does have a BUN/creatinine ratio of 10 raising concern for intrarenal or postrenal azotemia. He does have a history of BPH on tamsulosin.  Differential for AKI include COVID 19 infection vs urinary retention due to BPH - IV fluids today with LR at  13ml/hr - Renal ultrasound showed no hydronephrosis - Patient still retaining fluid so Foley catheter inserted - Will need outpatient urology follow up   #COPD Patient does present with symptoms concerning for possible COPD exacerbation with 2/3 cardinal symptoms. Home meds include albuterol, Symbicort, and Spiriva. No previous PFTs.  -Decadron 6 mg daily - Continue home meds of albuterol, Dulera, and Incruse Ellipta   #HFpEF (EF of 55-60%) #Ischemic Cardiomyopathy: Patient had a stress test performed in 2020 which showed a mildly reduced EF of 40%. His home meds include furosemide 20 mg daily, metoprolol 25 mg BID, and Entresto 24-26 mg  every other day, but he states that he is only taking the metoprolol and is not on any diuretics at home. He does not have a recent Echo on file. Appears hypovolemic on exam.  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 His home meds include ASA 81 mg daily and rosuvastatin 40 mg daily. Spoke with patient's son who states that they he was recently evaluated for possible carotid aneurysm in the past month.  He was unable to provide significant information regarding this visit.  He does have a significant history of carotid stenosis left greater than right.  We will consider further evaluation of this in the near future but currently  asymptomatic with no chest pain, palpitations, or altered mental status.  Additionally, he has cool lower extremities with a history of peripheral artery disease.  Will likely need ABIs during this hospitalization. - Cont ASA and rosuvastatin   #Prediabetes: His last a1c was 6.0 a year ago. He is  currently on metformin 250 mg BID.  Will likely have hyperglycemia in the setting of steroid use. -CBG monitoring -SSI   #Degenerative Disk Disease #Chronic Pain Syndrome: -Gabapentin 300 mg BID.    #BPH: -Tamsulosin 0.4 mg daily.    GERD: - continue PPI daily.   Decubitus Pressure Wound, Grade 1-2 - Sacral pressure pad to offload his sacrum

## 2021-03-10 NOTE — Progress Notes (Addendum)
HD#2 Subjective:  Overnight Events: None  Patient seen and assessed this morning at bedside.  Patient reports of breathing normally.  Patient's primary complaint is left knee pain around the back.  Patient reports to having a bowel movement today.  Discussed plan for patient to aspirate left knee effusion.  In addition, discussed that we would possibly put a Foley catheter in for patient as he regularly is retaining urine.  Finally discussed that we would speak with social worker about [facility] when patient is medically stable for discharge.  Patient verbalized understanding and agreement.  Patient had no other questions at this time.      Objective:  Vital signs in last 24 hours: Vitals:   03/09/21 2000 03/10/21 0000 03/10/21 0400 03/10/21 0745  BP: 108/62 132/63 130/73 136/81  Pulse: 76 (!) 58 66 80  Resp: 14 11 12 12   Temp: 98.6 F (37 C)  97.6 F (36.4 C) (!) 97.5 F (36.4 C)  TempSrc: Oral  Oral Oral  SpO2: 91% 95% 94% 94%  Weight:   76.4 kg   Height:       Supplemental O2: Nasal Cannula SpO2: 94 % O2 Flow Rate (L/min): 2 L/min   Physical Exam:  Physical Exam Constitutional:      Appearance: He is ill-appearing.  Cardiovascular:     Rate and Rhythm: Normal rate and regular rhythm.     Heart sounds: Normal heart sounds.  Pulmonary:     Breath sounds: Wheezing present.  Abdominal:     Palpations: Abdomen is soft.  Musculoskeletal:     Right lower leg: 4+ Edema present.     Left lower leg: 3+ Edema present.     Comments: Left knee swelling and pain  Neurological:     General: No focal deficit present.     Mental Status: He is alert and oriented to person, place, and time.    Filed Weights   03/08/21 1642 03/09/21 0421 03/10/21 0400  Weight: 74.2 kg 74.1 kg 76.4 kg     Intake/Output Summary (Last 24 hours) at 03/10/2021 0931 Last data filed at 03/10/2021 6378 Gross per 24 hour  Intake 2474.42 ml  Output 2450 ml  Net 24.42 ml    Net IO Since  Admission: 71.46 mL [03/10/21 0931]   Assessment/Plan:   Principal Problem:   COVID-19 Active Problems:   COPD (chronic obstructive pulmonary disease) (HCC)   PAD (peripheral artery disease) (HCC)   Acute kidney injury ([place])   Patient Summary:  Chris Payne is a 70 y.o. with pertinent PMH of COPD, HFrEFm CAD s/p CABG, Ruptured AAA s/p repair, CVA, HTN, Prediabetes who presented with Shortness of breath and admit for acute hypoxic respiratory failure on hospital day 2.   Plan: #COVID-19 with Hypoxia: Patient presents with shortness of breath and new oxygen requirement of 2 L nasal cannula. He tested positive for COVID-19, but remains afebrile without leukocytosis or systemic signs of infection.  Checks x-ray was within normal limits without any significant signs of consolidations.  He does have a significant history of COPD with multiple admissions for COPD exacerbations in the past which could be contributing to his presenting symptoms. - Fibrinogen 473 > 515 - Dexamethasone 6mg  x 10 days - Little benefit to remdesivir given time course - No toci or baricitinib given low supplemental oxygen requirement   #Left Knee Effusion Patient reports having left knee swelling and pain upon palpation.  Patient reports having hit head against a wall. -  Plan to aspirate fluid from knee and send for culture with gram stain, cell count + diff, w/ crystals  #Cellulitis #Left Lower Extremity Swelling, erythema, and pain #PAD Patient presents with new left lower extremity swelling out of proportion to his right lower extremity.   - U/S showed no DVT - ABIs with moderate bilateral PVD and absent left great toe waveform on the left  - Vascular surgery consulted and recommend outpatient follow up - Continue aspirin and rosuvastatin for secondary prevention  - Concern for superimposed cellulitis today given increased warmth and erythema compared to the RLE. Will start 5 day course of keflex.    #AKI #Urinary retention possibly 2/2 BPH Patient presents with an acute elevation in his creatinine from a baseline of 0.95-1.34 with a GFR of 57.  He does endorse decreased p.o. intake over the last week raising concern for possible prerenal azotemia.  However he does have a BUN/creatinine ratio of 10 raising concern for intrarenal or postrenal azotemia. He does have a history of BPH on tamsulosin.  Differential for AKI include COVID 19 infection vs urinary retention due to BPH - IV fluids today with LR at 139ml/hr - Renal ultrasound showed no hydronephrosis - Patient still retaining fluid so Foley catheter inserted - Will need outpatient urology follow up   #COPD Patient does present with symptoms concerning for possible COPD exacerbation with 2/3 cardinal symptoms. Home meds include albuterol, Symbicort, and Spiriva. No previous PFTs.  -Decadron 6 mg daily - Continue home meds of albuterol, Dulera, and Incruse Ellipta   #HFpEF (EF of 55-60%) #Ischemic Cardiomyopathy: Patient had a stress test performed in 2020 which showed a mildly reduced EF of 40%. His home meds include furosemide 20 mg daily, metoprolol 25 mg BID, and Entresto 24-26 mg  every other day, but he states that he is only taking the metoprolol and is not on any diuretics at home. He does not have a recent Echo on file. Appears hypovolemic on exam.  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 His home meds include ASA 81 mg daily and rosuvastatin 40 mg daily. Spoke with patient's son who states that they he was recently evaluated for possible carotid aneurysm in the past month.  He was unable to  provide significant information regarding this visit.  He does have a significant history of carotid stenosis left greater than right.  We will consider further evaluation of this in the near future but currently asymptomatic with no chest pain, palpitations, or altered mental status.  Additionally, he has cool lower extremities with a history of peripheral artery disease.  Will likely need ABIs during this hospitalization. - Cont ASA and rosuvastatin   #Prediabetes: His last a1c was 6.0 a year ago. He is currently on metformin 250 mg BID.  Will likely have hyperglycemia in the setting of steroid use. -CBG monitoring -SSI   #Degenerative Disk Disease #Chronic Pain Syndrome: -Gabapentin 300 mg BID.    #BPH: -Tamsulosin 0.4 mg daily.    GERD: - continue PPI daily.   Decubitus Pressure Wound, Grade 1-2 - Sacral pressure pad to offload his sacrum  Diet: Heart Healthy IVF: LR,100cc/hr VTE: Enoxaparin Code: Full PT/OT  recs: SNF for Subacute PT, none. TOC recs: pending   Dispo: Anticipated discharge to Skilled nursing facility in 5 days pending improved breathing and appropriate management of COVID symptoms.   France Ravens, MD 03/10/2021, 9:31 AM Pager: [phone]  Please contact the on call pager after 5 pm and on weekends at [phone].

## 2021-03-10 NOTE — NC FL2 (Signed)
[place] LEVEL OF CARE SCREENING TOOL     IDENTIFICATION  Patient Name: Chris Payne Birthdate: 02-25-1951 Sex: male Admission Date (Current Location): 03/07/2021  [facility] and [place] Number:  Herbalist and Address:  [facility], [place] [address], [place], [place] [address]      Provider Number: [id number]  Attending Physician Name and Address:  Velna Ochs, MD  Relative Name and Phone Number:  Dellia Nims (Relative)   [phone]    Current Level of Care: Hospital Recommended Level of Care: [place] Prior Approval Number:    Date Approved/Denied:   PASRR Number: [id number]  Discharge Plan: SNF    Current Diagnoses: Patient Active Problem List   Diagnosis Date Noted   Effusion of left knee    COVID-19 03/09/2021   Acute kidney injury ([place]) 03/09/2021   Shortness of breath 03/08/2021   HFrEF (heart failure with reduced ejection fraction) ([place]) 03/08/2021   Pressure injury of skin 03/08/2020   [place] rash of groin 10/19/2018   Acute on chronic systolic heart failure ([place]) 10/17/2018   Acute on chronic systolic (congestive) heart failure ([place]) 10/17/2018   COPD exacerbation ([place]) 05/21/2018   Chronic pain syndrome 07/19/2015   Degenerative disc disease, lumbar 07/19/2015   Weakness 04/17/2015   Generalized weakness 04/17/2015   Polycythemia 04/17/2015   BPH (benign prostatic hyperplasia) 04/17/2015   Seizure disorder ([place]) 10/12/2014   TIA (transient ischemic attack) 08/23/2013   Other and unspecified angina pectoris 07/04/2013   Nontoxic multinodular goiter 10/07/2012   Unstable angina (HCC) 06/19/2012   Carotid stenosis    PAD (peripheral artery disease) (HCC)    Left inguinal hernia 05/30/2012   COPD (chronic obstructive pulmonary disease) ([place]) 08/24/2011   S/P AAA repair 08/24/2011   Lumbar disc disease    Hypertensive heart disease    Hyperlipidemia    CAD (coronary artery  disease)    History of pontine CVA    GERD (gastroesophageal reflux disease)     Orientation RESPIRATION BLADDER Height & Weight     Self, Time, Situation, Place  Normal External catheter, Continent Weight: 168 lb 6.9 oz (76.4 kg) Height:  5\' 7"  (170.2 cm)  BEHAVIORAL SYMPTOMS/MOOD NEUROLOGICAL BOWEL NUTRITION STATUS      Continent Diet (See DC Summary)  AMBULATORY STATUS COMMUNICATION OF NEEDS Skin   Extensive Assist Verbally Normal                       Personal Care Assistance Level of Assistance  Bathing, Feeding, Dressing Bathing Assistance: Maximum assistance Feeding assistance: Independent Dressing Assistance: Maximum assistance     Functional Limitations Info  Sight, Speech, Hearing Sight Info: Impaired Hearing Info: Adequate Speech Info: Adequate    SPECIAL CARE FACTORS FREQUENCY  PT (By licensed PT), OT (By licensed OT)     PT Frequency: 5x a week OT Frequency: 5x a week            Contractures Contractures Info: Not present    Additional Factors Info  Code Status, Allergies Code Status Info: Full Allergies Info: NKA           Current Medications (03/10/2021):  This is the current hospital active medication list Current Facility-Administered Medications  Medication Dose Route Frequency Provider Last Rate Last Admin   acetaminophen (TYLENOL) tablet 650 mg  650 mg Oral Q6H PRN Marianna Payment, MD   650 mg at 03/10/21 1223   Or   acetaminophen (  TYLENOL) suppository 650 mg  650 mg Rectal Q6H PRN Marianna Payment, MD       albuterol (VENTOLIN HFA) 108 (90 Base) MCG/ACT inhaler 1-2 puff  1-2 puff Inhalation Q4H PRN Gaylan Gerold, DO       aspirin EC tablet 81 mg  81 mg Oral Daily Marianna Payment, MD   81 mg at 03/10/21 1004   dexamethasone (DECADRON) tablet 6 mg  6 mg Oral Q24H Marianna Payment, MD   6 mg at 03/10/21 1002   enoxaparin (LOVENOX) injection 30 mg  30 mg Subcutaneous Q24H Lyndee Leo, RPH   30 mg at 03/09/21 2133   gabapentin (NEURONTIN)  capsule 300 mg  300 mg Oral BID Marianna Payment, MD   300 mg at 03/10/21 1003   insulin aspart (novoLOG) injection 0-6 Units  0-6 Units Subcutaneous TID WC Gaylan Gerold, DO       insulin aspart (novoLOG) injection 2 Units  2 Units Subcutaneous TID WC Gaylan Gerold, DO       mometasone-formoterol (DULERA) 200-5 MCG/ACT inhaler 2 puff  2 puff Inhalation BID Marianna Payment, MD   2 puff at 03/10/21 1001   pantoprazole (PROTONIX) EC tablet 20 mg  20 mg Oral Daily Marianna Payment, MD   20 mg at 03/10/21 1003   polyethylene glycol (MIRALAX / GLYCOLAX) packet 17 g  17 g Oral Daily PRN Marianna Payment, MD       rosuvastatin (CRESTOR) tablet 40 mg  40 mg Oral Daily Gaylan Gerold, DO   40 mg at 03/10/21 1004   sodium chloride flush (NS) 0.9 % injection 3 mL  3 mL Intravenous Q12H Marianna Payment, MD   3 mL at 03/10/21 1300   tamsulosin (FLOMAX) capsule 0.4 mg  0.4 mg Oral Daily Marianna Payment, MD   0.4 mg at 03/10/21 1003   umeclidinium bromide (INCRUSE ELLIPTA) 62.5 MCG/ACT 1 puff  1 puff Inhalation Daily Marianna Payment, MD   1 puff at 03/10/21 1005     Discharge Medications: Please see discharge summary for a list of discharge medications.  Relevant Imaging Results:  Relevant Lab Results:   Additional Information SSN: [id number]; Non vaccinated  Reece Agar, LCSWA

## 2021-03-10 NOTE — NC FL2 (Signed)
[place] LEVEL OF CARE SCREENING TOOL     IDENTIFICATION  Patient Name: Chris Payne Birthdate: Sep 01, 1950 Sex: male Admission Date (Current Location): 03/07/2021  [facility] and [place] Number:  Herbalist and Address:  [facility]. [facility], [place] [address] [place], [place] [address]      Provider Number: [id number]  Attending Physician Name and Address:  Velna Ochs, MD  Relative Name and Phone Number:  Dellia Nims (Relative)   [phone]    Current Level of Care: Hospital Recommended Level of Care: [place] Prior Approval Number:    Date Approved/Denied:   PASRR Number: [id number]  Discharge Plan: SNF    Current Diagnoses: Patient Active Problem List   Diagnosis Date Noted   Effusion of left knee    COVID-19 03/09/2021   Acute kidney injury ([place]) 03/09/2021   Shortness of breath 03/08/2021   HFrEF (heart failure with reduced ejection fraction) ([place]) 03/08/2021   Pressure injury of skin 03/08/2020   [place] rash of groin 10/19/2018   Acute on chronic systolic heart failure ([place]) 10/17/2018   Acute on chronic systolic (congestive) heart failure ([place]) 10/17/2018   COPD exacerbation ([place]) 05/21/2018   Chronic pain syndrome 07/19/2015   Degenerative disc disease, lumbar 07/19/2015   Weakness 04/17/2015   Generalized weakness 04/17/2015   Polycythemia 04/17/2015   BPH (benign prostatic hyperplasia) 04/17/2015   Seizure disorder ([place]) 10/12/2014   TIA (transient ischemic attack) 08/23/2013   Other and unspecified angina pectoris 07/04/2013   Nontoxic multinodular goiter 10/07/2012   Unstable angina (HCC) 06/19/2012   Carotid stenosis    PAD (peripheral artery disease) (HCC)    Left inguinal hernia 05/30/2012   COPD (chronic obstructive pulmonary disease) ([place]) 08/24/2011   S/P AAA repair 08/24/2011   Lumbar disc disease    Hypertensive heart disease    Hyperlipidemia    CAD (coronary artery  disease)    History of pontine CVA    GERD (gastroesophageal reflux disease)     Orientation RESPIRATION BLADDER Height & Weight     Self, Time, Situation, Place  Normal External catheter, Continent Weight: 168 lb 6.9 oz (76.4 kg) Height:  5\' 7"  (170.2 cm)  BEHAVIORAL SYMPTOMS/MOOD NEUROLOGICAL BOWEL NUTRITION STATUS      Continent Diet (See DC Summary)  AMBULATORY STATUS COMMUNICATION OF NEEDS Skin   Extensive Assist Verbally Normal                       Personal Care Assistance Level of Assistance  Bathing, Feeding, Dressing Bathing Assistance: Maximum assistance Feeding assistance: Independent Dressing Assistance: Maximum assistance     Functional Limitations Info  Sight, Speech, Hearing Sight Info: Impaired Hearing Info: Adequate Speech Info: Adequate    SPECIAL CARE FACTORS FREQUENCY  PT (By licensed PT), OT (By licensed OT)     PT Frequency: 5x a week OT Frequency: 5x a week            Contractures Contractures Info: Not present    Additional Factors Info  Code Status, Allergies Code Status Info: Full Allergies Info: NKA           Current Medications (03/10/2021):  This is the current hospital active medication list Current Facility-Administered Medications  Medication Dose Route Frequency Provider Last Rate Last Admin   acetaminophen (TYLENOL) tablet 650 mg  650 mg Oral Q6H PRN Marianna Payment, MD   650 mg at 03/10/21 1223   Or   acetaminophen (  TYLENOL) suppository 650 mg  650 mg Rectal Q6H PRN Marianna Payment, MD       albuterol (VENTOLIN HFA) 108 (90 Base) MCG/ACT inhaler 1-2 puff  1-2 puff Inhalation Q4H PRN Gaylan Gerold, DO       aspirin EC tablet 81 mg  81 mg Oral Daily Marianna Payment, MD   81 mg at 03/10/21 1004   dexamethasone (DECADRON) tablet 6 mg  6 mg Oral Q24H Marianna Payment, MD   6 mg at 03/10/21 1002   enoxaparin (LOVENOX) injection 30 mg  30 mg Subcutaneous Q24H Lyndee Leo, RPH   30 mg at 03/09/21 2133   gabapentin (NEURONTIN)  capsule 300 mg  300 mg Oral BID Marianna Payment, MD   300 mg at 03/10/21 1003   insulin aspart (novoLOG) injection 0-6 Units  0-6 Units Subcutaneous TID WC Gaylan Gerold, DO       insulin aspart (novoLOG) injection 2 Units  2 Units Subcutaneous TID WC Gaylan Gerold, DO       mometasone-formoterol (DULERA) 200-5 MCG/ACT inhaler 2 puff  2 puff Inhalation BID Marianna Payment, MD   2 puff at 03/10/21 1001   pantoprazole (PROTONIX) EC tablet 20 mg  20 mg Oral Daily Marianna Payment, MD   20 mg at 03/10/21 1003   polyethylene glycol (MIRALAX / GLYCOLAX) packet 17 g  17 g Oral Daily PRN Marianna Payment, MD       rosuvastatin (CRESTOR) tablet 40 mg  40 mg Oral Daily Gaylan Gerold, DO   40 mg at 03/10/21 1004   sodium chloride flush (NS) 0.9 % injection 3 mL  3 mL Intravenous Q12H Marianna Payment, MD   3 mL at 03/10/21 1300   tamsulosin (FLOMAX) capsule 0.4 mg  0.4 mg Oral Daily Marianna Payment, MD   0.4 mg at 03/10/21 1003   umeclidinium bromide (INCRUSE ELLIPTA) 62.5 MCG/ACT 1 puff  1 puff Inhalation Daily Marianna Payment, MD   1 puff at 03/10/21 1005     Discharge Medications: Please see discharge summary for a list of discharge medications.  Relevant Imaging Results:  Relevant Lab Results:   Additional Information SSN: [id number]; Non vaccinated  Reece Agar, LCSWA

## 2021-03-10 NOTE — Procedures (Addendum)
After consent was obtained, using sterile technique the left knee was prepped and disinfected with iodine swab. The knee joint was entered from the lateral infrapatellar approach using a 18 gauge 1 and 1/2 inch needle and 10 - 15 ml's of red colored fluid was withdrawn and sent for culture, gram stain, crystal, and cell count with differential.    The procedure was well tolerated.  The patient is asked to continue to rest the knee for a few more days.  It may be more painful for the first 1-2 days.  Watch for fever, or increased swelling or persistent pain in knee.

## 2021-03-11 ENCOUNTER — Inpatient Hospital Stay (HOSPITAL_COMMUNITY): Payer: Medicare Other

## 2021-03-11 DIAGNOSIS — M7989 Other specified soft tissue disorders: Secondary | ICD-10-CM

## 2021-03-11 LAB — BASIC METABOLIC PANEL
Anion gap: 7 (ref 5–15)
BUN: 33 mg/dL — ABNORMAL HIGH (ref 8–23)
CO2: 24 mmol/L (ref 22–32)
Calcium: 8 mg/dL — ABNORMAL LOW (ref 8.9–10.3)
Chloride: 104 mmol/L (ref 98–111)
Creatinine, Ser: 2.14 mg/dL — ABNORMAL HIGH (ref 0.61–1.24)
GFR, Estimated: 32 mL/min — ABNORMAL LOW (ref >60)
Glucose, Bld: 167 mg/dL — ABNORMAL HIGH (ref 70–99)
Potassium: 3.8 mmol/L (ref 3.5–5.1)
Sodium: 135 mmol/L (ref 135–145)

## 2021-03-11 LAB — SYNOVIAL CELL COUNT + DIFF, W/ CRYSTALS
Crystals, Fluid: NONE SEEN
Lymphocytes-Synovial Fld: 1 % (ref 0–20)
Monocyte-Macrophage-Synovial Fluid: 1 % — ABNORMAL LOW (ref 50–90)
Neutrophil, Synovial: 98 % — ABNORMAL HIGH (ref 0–25)
WBC, Synovial: 8800 /mm3 — ABNORMAL HIGH (ref 0–200)

## 2021-03-11 LAB — CBC
HCT: 42 % (ref 39.0–52.0)
Hemoglobin: 14 g/dL (ref 13.0–17.0)
MCH: 32.6 pg (ref 26.0–34.0)
MCHC: 33.3 g/dL (ref 30.0–36.0)
MCV: 97.9 fL (ref 80.0–100.0)
Platelets: 140 10*3/uL — ABNORMAL LOW (ref 150–400)
RBC: 4.29 MIL/uL (ref 4.22–5.81)
RDW: 12.8 % (ref 11.5–15.5)
WBC: 5 10*3/uL (ref 4.0–10.5)
nRBC: 0 % (ref 0.0–0.2)

## 2021-03-11 LAB — GLUCOSE, CAPILLARY
Glucose-Capillary: 140 mg/dL — ABNORMAL HIGH (ref 70–99)
Glucose-Capillary: 167 mg/dL — ABNORMAL HIGH (ref 70–99)
Glucose-Capillary: 165 mg/dL — ABNORMAL HIGH (ref 70–99)
Glucose-Capillary: 197 mg/dL — ABNORMAL HIGH (ref 70–99)

## 2021-03-11 LAB — FERRITIN: Ferritin: 555 ng/mL — ABNORMAL HIGH (ref 24–336)

## 2021-03-11 LAB — D-DIMER, QUANTITATIVE: D-Dimer, Quant: 3.04 ug/mL-FEU — ABNORMAL HIGH (ref 0.00–0.50)

## 2021-03-11 LAB — C-REACTIVE PROTEIN: CRP: 16.6 mg/dL — ABNORMAL HIGH (ref <1.0)

## 2021-03-11 MED ORDER — GADOBUTROL 1 MMOL/ML IV SOLN
7.0000 mL | Freq: Once | INTRAVENOUS | Status: AC | PRN
  Administered 2021-03-11: 7 mL via INTRAVENOUS

## 2021-03-11 MED ORDER — CHLORHEXIDINE GLUCONATE CLOTH 2 % EX PADS
6.0000 | MEDICATED_PAD | Freq: Every day | CUTANEOUS | Status: DC
  Administered 2021-03-11 – 2021-03-12 (×2): 6 via TOPICAL

## 2021-03-11 MED ORDER — LACTATED RINGERS IV SOLN: INTRAVENOUS | Status: AC

## 2021-03-11 NOTE — Progress Notes (Signed)
HD#3 Subjective:  Overnight Events: None  Patient seen and assessed this morning at bedside.  Patient reports improved breathing since admission although still requiring 2 L .  Patient still reports left leg soreness and pain as well as knee swelling again. Patient denies any other physical complaints at this time.  Discussed plan with patient to have left leg and foot MRI for concern of infection. In addition, discussed we put patient on Keflex due to concern about cellulitis. Plan for d/c to SNF once patient is medically stable. Patient verbalized understanding and agreement.    Objective:  Vital signs in last 24 hours: Vitals:   03/10/21 0745 03/10/21 2000 03/11/21 0000 03/11/21 0400  BP: 136/81 113/68 116/70 116/75  Pulse: 80 74 79 81  Resp: 12 17 16 17   Temp: (!) 97.5 F (36.4 C) 98.4 F (36.9 C)  98.2 F (36.8 C)  TempSrc: Oral Oral  Oral  SpO2: 94% 95% 90% 90%  Weight:    72.2 kg  Height:       Supplemental O2: Nasal Cannula SpO2: 90 % O2 Flow Rate (L/min): 2 L/min   Physical Exam:  Physical Exam Constitutional:      Appearance: He is ill-appearing.  Cardiovascular:     Rate and Rhythm: Normal rate and regular rhythm.     Heart sounds: Normal heart sounds.  Pulmonary:     Breath sounds: Wheezing present.  Abdominal:     Palpations: Abdomen is soft.  Musculoskeletal:     Right lower leg: 4+ Edema present.     Left lower leg: 3+ Edema present.     Comments: Less left knee swelling than yesterday s/p aspiration. Significant myoclonus bilaterally in feet  Neurological:     General: No focal deficit present.     Mental Status: He is alert and oriented to person, place, and time.     Filed Weights   03/09/21 0421 03/10/21 0400 03/11/21 0400  Weight: 74.1 kg 76.4 kg 72.2 kg     Intake/Output Summary (Last 24 hours) at 03/11/2021 0093 Last data filed at 03/11/2021 0500 Gross per 24 hour  Intake 240 ml  Output 2465 ml  Net -2225 ml    Net IO  Since Admission: -2,153.54 mL [03/11/21 0643]   Assessment/Plan:   Principal Problem:   COVID-19 Active Problems:   COPD (chronic obstructive pulmonary disease) (HCC)   PAD (peripheral artery disease) (HCC)   Acute kidney injury ([place])   Effusion of left knee   Patient Summary:  Chris Payne is a 70 y.o. with pertinent PMH of COPD, HFrEFm CAD s/p CABG, Ruptured AAA s/p repair, CVA, HTN, Prediabetes who presented with Shortness of breath and admit for acute hypoxic respiratory failure on hospital day 3.   Plan: #Acute Hypoxic Respiratory Failure 2/2 COVID-19 Infection Patient continues to require 2 L oxygen nasal cannula.  Although patient's inflammatory markers appears to be progressively elevated, patient is clinically improving with regards to the shortness of breath. - CRP 9.6 > 11.6 > 16.6 - Ferritin 473 > 515 > 555 - Dexamethasone 6mg  x 10 days (Day 3) - Little benefit to remdesivir given time course - No toci or baricitinib given low supplemental oxygen requirement   #Traumatic Left Knee Effusion  Patient reports having left knee swelling and pain upon palpation.  Patient reports having hit head against a wall.  Given synovial fluid culture and cell count, effusion was likely due to trauma as opposed to infection. -10 cc bloody  fluid aspirated from knee, patient reports relief of knee pain  -Fluid showed: red, bloody, 8800 WBC, no growth < 24 hours  #Cellulitis #Left Lower Extremity Swelling, erythema, and pain #PAD Patient presents with new left lower extremity swelling out of proportion to his right lower extremity. Erythema in left leg concerning for possible cellulitis.  Given worsening left leg and foot swelling, MRI ordered of left leg and foot for concern of infection. - U/S showed no DVT - ABIs with moderate bilateral PVD and absent left great toe waveform on the left  - Vascular surgery consulted and recommend outpatient follow up - Continue aspirin and  rosuvastatin for secondary prevention  - Concern for superimposed cellulitis today given increased warmth and erythema compared to the RLE. Day 2/5 of Keflex - MRI of left leg and foot pending  #AKI #Urinary retention possibly 2/2 BPH BUN and Creatinine largely unchanged since admission. Patient appeared hypovolemic so will add IV fluids.  - IV fluids today with LR at 126ml/hr for 10 hours - Renal ultrasound showed no hydronephrosis - On Foley catheter  - Will need outpatient urology follow up   #COPD Patient does present with symptoms concerning for possible COPD exacerbation with 2/3 cardinal symptoms. Home meds include albuterol, Symbicort, and Spiriva. No previous PFTs.  -Decadron 6 mg daily -Continue home meds of albuterol, Dulera, and Incruse Ellipta   #HFpEF (EF of 55-60%) #Ischemic Cardiomyopathy: Patient had a stress test performed in 2020 which showed a mildly reduced EF of 40%. His home meds include furosemide 20 mg daily, metoprolol 25 mg BID, and Entresto 24-26 mg  every other day, but he states that he is only taking the metoprolol and is not on any diuretics at home. He does not have a recent Echo on file. Appears hypovolemic on exam.  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 His home meds include ASA 81 mg daily and rosuvastatin 40 mg daily. Spoke with patient's son who states that they he was recently evaluated for possible carotid aneurysm in the past month.  He was unable to provide significant information regarding this visit.  He does have a significant history of carotid stenosis left greater than right.  We will consider further  evaluation of this in the near future but currently asymptomatic with no chest pain, palpitations, or altered mental status.  Additionally, he has cool lower extremities with a history of peripheral artery disease. - Continue ASA and rosuvastatin   #Prediabetes: His last a1c was 6.0 a year ago. He is currently on metformin 250 mg BID.  Will likely have hyperglycemia in the setting of steroid use. -CBG monitoring -SSI   #Degenerative Disk Disease #Chronic Pain Syndrome: -Gabapentin 300 mg BID.    #BPH: -Tamsulosin 0.4 mg daily.    GERD: - continue PPI daily.   Decubitus Pressure Wound, Grade 1-2 - Sacral pressure pad to offload his sacrum  Diet: Heart Healthy IVF: LR,100cc/hr VTE: Enoxaparin Code: Full PT/OT recs: SNF for Subacute PT, none. TOC recs: pending   Dispo: Anticipated discharge to Skilled nursing facility in 5 days pending improved breathing and appropriate management of COVID symptoms.   France Ravens, MD 03/11/2021, 6:43 AM  Pager: [phone]  Please contact the on call pager after 5 pm and on weekends at [phone].

## 2021-03-11 NOTE — Consult Note (Signed)
   [facility] Inpatient Consult   03/11/2021  HAKIEM MALIZIA Jan 23, 1951 [id number]  [place] Organization [ACO] Patient: [employer] Medicare  Primary Care Provider:  Benito Mccreedy, MD    Patient screened for hospitalization with post hospital needs. Review of patient's medical record reveals patient is for potential skilled nursing facility per progression meeting discussion.   Plan:  Continue to follow progress and disposition to assess for post hospital care management needs.    For questions contact:   Natividad Brood, RN BSN [facility] Liaison  [phone] business mobile phone Toll free office [phone]  Fax number: [phone] [email or web address]

## 2021-03-11 NOTE — Progress Notes (Signed)
Physical Therapy Treatment Patient Details Name: Chris Payne MRN: [id number] DOB: [birth year] Today's Date: 03/11/2021   History of Present Illness Pt is a 70 y.o. male who presented 03/07/21 with acute onset of SOB and recent diagnosis of COVID-19. PMH of COPD, HFrEFm CAD s/p CABG, Ruptured AAA s/p repair, CVA, HTN, Prediabetes    PT Comments    Pt awaiting transport to MRI, agreeable to therapy. Pt limited in safe mobility by increased O2 demand related to COVID, decreased strength and ROM in L LE due to effusion, in presence of generalized deconditioning. Pt requires modAx2 for bed mobility and lateral scoot transfer along EoB. Pt's step son called during session, wanting update and medical team notified. D/c plans remain appropriate at this time. PT will continue to follow acutely.    Recommendations for follow up therapy are one component of a multi-disciplinary discharge planning process, led by the attending physician.  Recommendations may be updated based on patient status, additional functional criteria and insurance authorization.  Follow Up Recommendations  Skilled nursing-short term rehab (<3 hours/day)     Assistance Recommended at Discharge Intermittent Supervision/Assistance  Equipment Recommendations  None recommended by PT    Recommendations for Other Services       Precautions / Restrictions Precautions Precautions: Fall;Other (comment) Precaution Comments: COVID+, monitor SpO2 Restrictions Weight Bearing Restrictions: No     Mobility  Bed Mobility Overal bed mobility: Needs Assistance Bed Mobility: Supine to Sit;Sit to Supine     Supine to sit: +2 for safety/equipment;HOB elevated;Mod assist (good following step by step commands, assist with LLE and trunk elevation) Sit to supine: Max assist;+2 for physical assistance;+2 for safety/equipment   General bed mobility comments: Pt requiring extra time to manage legs off EOB and transition to propped up on  L elbow to exit L EOB with HOB elevated before needing physical assistance to complete the transition. ModA to complete trunk ascension and scoot hips to EOB with use of bed pad. Helipcopter technique used for return supine    Transfers Overall transfer level: Needs assistance Equipment used: 2 person hand held assist Transfers: Sit to/from Stand Sit to Stand: Mod assist;+2 physical assistance;+2 safety/equipment;From elevated surface           General transfer comment: lateral scoots up the bed for repositioning, good hand placement on bed, still requires mod A +2           Balance Overall balance assessment: Needs assistance Sitting-balance support: No upper extremity supported;Feet supported Sitting balance-Leahy Scale: Fair Sitting balance - Comments: Able to reach min off BOS to mid-lower leg with supervision.   Standing balance support: Reliant on assistive device for balance Standing balance-Leahy Scale: Poor Standing balance comment: ModAx2 and bil UE support to stand.                            Cognition Arousal/Alertness: Awake/alert Behavior During Therapy: WFL for tasks assessed/performed Overall Cognitive Status: Within Functional Limits for tasks assessed                                 General Comments: Pt very pleasant           General Comments General comments (skin integrity, edema, etc.): Pt "sounds wet" with wet cough, able to cough up some flem while therapy team present. Provided Pt with spit cup, VSS on 2L O2 via   Pertinent Vitals/Pain Pain Assessment: Faces Faces Pain Scale: Hurts little more Pain Location: L knee and leg; back Pain Descriptors / Indicators: Discomfort;Grimacing Pain Intervention(s): Limited activity within patient's tolerance;Monitored during session;Repositioned     PT Goals (current goals can now be found in the care plan section) Acute Rehab PT Goals PT Goal Formulation: With  patient Time For Goal Achievement: 03/23/21 Potential to Achieve Goals: Good Progress towards PT goals: Progressing toward goals    Frequency    Min 2X/week      PT Plan Current plan remains appropriate    Co-evaluation PT/OT/SLP Co-Evaluation/Treatment: Yes Reason for Co-Treatment: For patient/therapist safety PT goals addressed during session: Mobility/safety with mobility OT goals addressed during session: ADL's and self-care      AM-PAC PT "6 Clicks" Mobility   Outcome Measure  Help needed turning from your back to your side while in a flat bed without using bedrails?: A Lot Help needed moving from lying on your back to sitting on the side of a flat bed without using bedrails?: A Lot Help needed moving to and from a bed to a chair (including a wheelchair)?: Total Help needed standing up from a chair using your arms (e.g., wheelchair or bedside chair)?: Total Help needed to walk in hospital room?: Total Help needed climbing 3-5 steps with a railing? : Total 6 Click Score: 8    End of Session Equipment Utilized During Treatment: Oxygen Activity Tolerance: Patient tolerated treatment well Patient left: with call bell/phone within reach;in bed;with bed alarm set (awaiting transport to MRI) Nurse Communication: Mobility status;Other (comment) (pt step son Chris Payne called, connected with MD via secure chat) PT Visit Diagnosis: Unsteadiness on feet (R26.81);Other abnormalities of gait and mobility (R26.89);Muscle weakness (generalized) (M62.81);History of falling (Z91.81);Repeated falls (R29.6);Difficulty in walking, not elsewhere classified (R26.2)     Time: 1120-1203 PT Time Calculation (min) (ACUTE ONLY): 43 min  Charges:  $Therapeutic Activity: 8-22 mins                     Sakib Noguez B. Migdalia Dk PT, DPT Acute Rehabilitation Services Pager [phone] Office [phone]    [place] 03/11/2021, 4:13 PM

## 2021-03-11 NOTE — Progress Notes (Signed)
Occupational Therapy Treatment Patient Details Name: Chris Payne MRN: [id number] DOB: December 08, 1950 Today's Date: 03/11/2021   History of present illness Pt is a 70 y.o. male who presented 03/07/21 with acute onset of SOB and recent diagnosis of COVID-19. PMH of COPD, HFrEFm CAD s/p CABG, Ruptured AAA s/p repair, CVA, HTN, Prediabetes   OT comments  Pt with LLE very painful and swollen, but despite that eager to work with therapy. Today able to to come EOB with mod A +2 and extra time. While at the EOB he was able to participate in exercises, grooming tasks, and lateral scoots at mod A +2. Access to LB for ADL remains mod to max A. Son "Toula Moos" called while we were in the room (he also has covid) and PT/OT reached out to have MD follow up about some questions that he had about his Dad's progress/dx. MD team sent message through [place]. Pt POC appropriate and will continue at this time. Pt did enjoy listening to country music this session during HEP and seated grooming tasks.    Recommendations for follow up therapy are one component of a multi-disciplinary discharge planning process, led by the attending physician.  Recommendations may be updated based on patient status, additional functional criteria and insurance authorization.    Follow Up Recommendations  Skilled nursing-short term rehab (<3 hours/day)    Assistance Recommended at Discharge Intermittent Supervision/Assistance  Equipment Recommendations  BSC/3in1;Other (comment) (defer to next venue of care)    Recommendations for Other Services PT consult    Precautions / Restrictions Precautions Precautions: Fall;Other (comment) Precaution Comments: COVID+, monitor SpO2 Restrictions Weight Bearing Restrictions: No       Mobility Bed Mobility Overal bed mobility: Needs Assistance Bed Mobility: Supine to Sit;Sit to Supine     Supine to sit: +2 for safety/equipment;HOB elevated;Mod assist (good following step by step commands,  assist with LLE and trunk elevation) Sit to supine: Max assist;+2 for physical assistance;+2 for safety/equipment   General bed mobility comments: Pt requiring extra time to manage legs off EOB and transition to propped up on L elbow to exit L EOB with HOB elevated before needing physical assistance to complete the transition. ModA to complete trunk ascension and scoot hips to EOB with use of bed pad. Helipcopter technique used for return supine    Transfers Overall transfer level: Needs assistance Equipment used: 2 person hand held assist Transfers: Sit to/from Stand Sit to Stand: Mod assist;+2 physical assistance;+2 safety/equipment;From elevated surface           General transfer comment: lateral scoots up the bed for repositioning, good hand placement on bed, still requires mod A +2     Balance Overall balance assessment: Needs assistance Sitting-balance support: No upper extremity supported;Feet supported Sitting balance-Leahy Scale: Fair Sitting balance - Comments: Able to reach min off BOS to mid-lower leg with supervision.   Standing balance support: Reliant on assistive device for balance Standing balance-Leahy Scale: Poor Standing balance comment: ModAx2 and bil UE support to stand.                           ADL either performed or assessed with clinical judgement   ADL Overall ADL's : Needs assistance/impaired     Grooming: Wash/dry hands;Wash/dry face;Set up;Sitting Grooming Details (indicate cue type and reason): EOB Upper Body Bathing: Moderate assistance;Sitting Upper Body Bathing Details (indicate cue type and reason): for back Lower Body Bathing: Moderate assistance Lower Body Bathing Details (  indicate cue type and reason): for knees down                     Functional mobility during ADLs: Moderate assistance;+2 for physical assistance;+2 for safety/equipment;Rolling walker (2 wheels) General ADL Comments: decreased balance, activity  tolerance, impacted by edema    Extremity/Trunk Assessment Upper Extremity Assessment Upper Extremity Assessment: Generalized weakness   Lower Extremity Assessment Lower Extremity Assessment: LLE deficits/detail LLE Deficits / Details: Significant edema and erythema noted in lower leg/foot        Vision       Perception     Praxis      Cognition Arousal/Alertness: Awake/alert Behavior During Therapy: WFL for tasks assessed/performed Overall Cognitive Status: Within Functional Limits for tasks assessed                                 General Comments: Pt very pleasant          Exercises     Shoulder Instructions       General Comments Pt "sounds wet" with wet cough, able to cough up some flem while therapy team present. Provided Pt with spit cup    Pertinent Vitals/ Pain       Pain Assessment: Faces Faces Pain Scale: Hurts little more Pain Location: L knee and leg; back Pain Descriptors / Indicators: Discomfort;Grimacing Pain Intervention(s): Monitored during session;Repositioned  Home Living                                          Prior Functioning/Environment              Frequency  Min 2X/week        Progress Toward Goals  OT Goals(current goals can now be found in the care plan section)  Progress towards OT goals: Progressing toward goals  Acute Rehab OT Goals Patient Stated Goal: get stronger again OT Goal Formulation: With patient Time For Goal Achievement: 03/23/21 Potential to Achieve Goals: Good ADL Goals Pt Will Perform Grooming: with modified independence;sitting Pt Will Perform Upper Body Dressing: with modified independence;sitting Pt Will Perform Lower Body Dressing: sitting/lateral leans;with min assist;with caregiver independent in assisting Pt Will Transfer to Toilet: with min assist;stand pivot transfer;bedside commode Pt Will Perform Toileting - Clothing Manipulation and hygiene: with min  guard assist;with caregiver independent in assisting;sitting/lateral leans Additional ADL Goal #1: Pt will recall 3 ways of conserving energy during ADL routine with no cues  Plan Discharge plan remains appropriate;Frequency remains appropriate    Co-evaluation    PT/OT/SLP Co-Evaluation/Treatment: Yes Reason for Co-Treatment: For patient/therapist safety;To address functional/ADL transfers PT goals addressed during session: Mobility/safety with mobility;Balance;Strengthening/ROM OT goals addressed during session: ADL's and self-care;Proper use of Adaptive equipment and DME;Strengthening/ROM      AM-PAC OT "6 Clicks" Daily Activity     Outcome Measure   Help from another person eating meals?: None Help from another person taking care of personal grooming?: A Little Help from another person toileting, which includes using toliet, bedpan, or urinal?: A Lot Help from another person bathing (including washing, rinsing, drying)?: A Lot Help from another person to put on and taking off regular upper body clothing?: A Little Help from another person to put on and taking off regular lower body clothing?: A Lot 6 Click Score: 16  End of Session Equipment Utilized During Treatment: Oxygen (2L)  OT Visit Diagnosis: Unsteadiness on feet (R26.81);Other abnormalities of gait and mobility (R26.89);Muscle weakness (generalized) (M62.81)   Activity Tolerance Patient tolerated treatment well   Patient Left with call bell/phone within reach;in bed;with bed alarm set;with nursing/sitter in room   Nurse Communication Mobility status;Precautions        Time: 1120-1203 OT Time Calculation (min): 43 min  Charges: OT General Charges $OT Visit: 1 Visit OT Treatments $Self Care/Home Management : 8-22 mins $Therapeutic Activity: 8-22 mins  Jesse Sans OTR/L Acute Rehabilitation Services Pager: [phone] Office: [place] 03/11/2021, 1:55 PM

## 2021-03-12 DIAGNOSIS — G894 Chronic pain syndrome: Secondary | ICD-10-CM

## 2021-03-12 DIAGNOSIS — I255 Ischemic cardiomyopathy: Secondary | ICD-10-CM

## 2021-03-12 DIAGNOSIS — E785 Hyperlipidemia, unspecified: Secondary | ICD-10-CM

## 2021-03-12 DIAGNOSIS — L03116 Cellulitis of left lower limb: Secondary | ICD-10-CM

## 2021-03-12 DIAGNOSIS — M25462 Effusion, left knee: Secondary | ICD-10-CM

## 2021-03-12 DIAGNOSIS — I739 Peripheral vascular disease, unspecified: Secondary | ICD-10-CM

## 2021-03-12 DIAGNOSIS — N179 Acute kidney failure, unspecified: Secondary | ICD-10-CM

## 2021-03-12 DIAGNOSIS — N401 Enlarged prostate with lower urinary tract symptoms: Secondary | ICD-10-CM

## 2021-03-12 DIAGNOSIS — M519 Unspecified thoracic, thoracolumbar and lumbosacral intervertebral disc disorder: Secondary | ICD-10-CM

## 2021-03-12 DIAGNOSIS — R7303 Prediabetes: Secondary | ICD-10-CM

## 2021-03-12 DIAGNOSIS — K219 Gastro-esophageal reflux disease without esophagitis: Secondary | ICD-10-CM

## 2021-03-12 DIAGNOSIS — J9601 Acute respiratory failure with hypoxia: Secondary | ICD-10-CM

## 2021-03-12 DIAGNOSIS — I5030 Unspecified diastolic (congestive) heart failure: Secondary | ICD-10-CM

## 2021-03-12 DIAGNOSIS — R338 Other retention of urine: Secondary | ICD-10-CM

## 2021-03-12 DIAGNOSIS — I25110 Atherosclerotic heart disease of native coronary artery with unstable angina pectoris: Secondary | ICD-10-CM

## 2021-03-12 DIAGNOSIS — J440 Chronic obstructive pulmonary disease with acute lower respiratory infection: Secondary | ICD-10-CM

## 2021-03-12 DIAGNOSIS — U071 COVID-19: Secondary | ICD-10-CM

## 2021-03-12 LAB — CBC
HCT: 41.9 % (ref 39.0–52.0)
Hemoglobin: 14 g/dL (ref 13.0–17.0)
MCH: 32.7 pg (ref 26.0–34.0)
MCHC: 33.4 g/dL (ref 30.0–36.0)
MCV: 97.9 fL (ref 80.0–100.0)
Platelets: 179 10*3/uL (ref 150–400)
RBC: 4.28 MIL/uL (ref 4.22–5.81)
RDW: 12.8 % (ref 11.5–15.5)
WBC: 3.7 10*3/uL — ABNORMAL LOW (ref 4.0–10.5)
nRBC: 0 % (ref 0.0–0.2)

## 2021-03-12 LAB — GLUCOSE, CAPILLARY
Glucose-Capillary: 191 mg/dL — ABNORMAL HIGH (ref 70–99)
Glucose-Capillary: 241 mg/dL — ABNORMAL HIGH (ref 70–99)
Glucose-Capillary: 218 mg/dL — ABNORMAL HIGH (ref 70–99)
Glucose-Capillary: 246 mg/dL — ABNORMAL HIGH (ref 70–99)

## 2021-03-12 LAB — BASIC METABOLIC PANEL
Anion gap: 7 (ref 5–15)
BUN: 38 mg/dL — ABNORMAL HIGH (ref 8–23)
CO2: 25 mmol/L (ref 22–32)
Calcium: 8.1 mg/dL — ABNORMAL LOW (ref 8.9–10.3)
Chloride: 104 mmol/L (ref 98–111)
Creatinine, Ser: 1.76 mg/dL — ABNORMAL HIGH (ref 0.61–1.24)
GFR, Estimated: 41 mL/min — ABNORMAL LOW (ref >60)
Glucose, Bld: 182 mg/dL — ABNORMAL HIGH (ref 70–99)
Potassium: 4 mmol/L (ref 3.5–5.1)
Sodium: 136 mmol/L (ref 135–145)

## 2021-03-12 LAB — FERRITIN: Ferritin: 513 ng/mL — ABNORMAL HIGH (ref 24–336)

## 2021-03-12 LAB — C-REACTIVE PROTEIN: CRP: 11.9 mg/dL — ABNORMAL HIGH (ref <1.0)

## 2021-03-12 LAB — D-DIMER, QUANTITATIVE: D-Dimer, Quant: 4.65 ug/mL-FEU — ABNORMAL HIGH (ref 0.00–0.50)

## 2021-03-12 LAB — BRAIN NATRIURETIC PEPTIDE: B Natriuretic Peptide: 55.1 pg/mL (ref 0.0–100.0)

## 2021-03-12 MED ORDER — LACTATED RINGERS IV SOLN: INTRAVENOUS | Status: AC

## 2021-03-12 MED ORDER — ENOXAPARIN SODIUM 40 MG/0.4ML IJ SOSY
40.0000 mg | PREFILLED_SYRINGE | INTRAMUSCULAR | Status: DC
  Administered 2021-03-12: 40 mg via SUBCUTANEOUS
  Filled 2021-03-12: qty 0.4

## 2021-03-12 MED ORDER — INSULIN ASPART 100 UNIT/ML IJ SOLN
4.0000 [IU] | Freq: Three times a day (TID) | INTRAMUSCULAR | Status: DC
  Administered 2021-03-12 – 2021-03-13 (×6): 4 [IU] via SUBCUTANEOUS

## 2021-03-12 NOTE — TOC Initial Note (Deleted)
Transition of Care [facility]) - Initial/Assessment Note    Patient Details  Name: Chris Payne MRN: [id number] Date of Birth: May 04, 1950  [facility] CM/SW Contact:    Tresa Endo Phone Number: 03/12/2021, 11:51 AM  Clinical Narrative:                 CSW received SNF consult. CSW met with pt via phone, pt is covid positive. CSW introduced self and explained role at the hospital. Pt reports that PTA pt lived at home with his spouse, Joslyn Hy and his girlfriend. PT reports pt needs assistance with ADLs. PT reports pt has a click score of 8 and is a x2 assist.  CSW reviewed PT/OT recommendations for SNF. Pt reports agreeable to a SNF. Pt gave CSW permission to fax out to facilities in the area. Pt has no preference of facility at this time. PT reports they are not vaccinated   CSW will continue to follow.          Patient Goals and CMS Choice        Expected Discharge Plan and Services                                                Prior Living Arrangements/Services                       Activities of Daily Living Home Assistive Devices/Equipment: Bedside commode/3-in-1, Walker (specify type) ADL Screening (condition at time of admission) Patient's cognitive ability adequate to safely complete daily activities?: Yes Is the patient deaf or have difficulty hearing?: No Does the patient have difficulty seeing, even when wearing glasses/contacts?: No Does the patient have difficulty concentrating, remembering, or making decisions?: No Patient able to express need for assistance with ADLs?: Yes Does the patient have difficulty dressing or bathing?: Yes Independently performs ADLs?: No Communication: Independent Dressing (OT): Needs assistance Is this a change from baseline?: Pre-admission baseline Grooming: Needs assistance Is this a change from baseline?: Pre-admission baseline Feeding: Independent Bathing: Needs assistance Is  this a change from baseline?: Pre-admission baseline Toileting: Needs assistance Is this a change from baseline?: Pre-admission baseline In/Out Bed: Needs assistance Is this a change from baseline?: Pre-admission baseline Walks in Home: Independent Does the patient have difficulty walking or climbing stairs?: Yes Weakness of Legs: Both Weakness of Arms/Hands: None  Permission Sought/Granted                  Emotional Assessment              Admission diagnosis:  Shortness of breath [R06.02] Generalized weakness [R53.1] Acute hypoxemic respiratory failure due to COVID-19 ([place]) [U07.1, J96.01] COVID-19 virus infection [U07.1] Patient Active Problem List   Diagnosis Date Noted   Swelling of left foot    Effusion of left knee    COVID-19 03/09/2021   Acute kidney injury ([place]) 03/09/2021   Shortness of breath 03/08/2021   HFrEF (heart failure with reduced ejection fraction) ([place]) 03/08/2021   Pressure injury of skin 03/08/2020   Candida rash of groin 10/19/2018   Acute on chronic systolic heart failure ([place]) 10/17/2018   Acute on chronic systolic (congestive) heart failure ([place]) 10/17/2018   COPD exacerbation ([place]) 05/21/2018   Chronic pain syndrome 07/19/2015   Degenerative disc disease, lumbar 07/19/2015   Weakness 04/17/2015  Generalized weakness 04/17/2015   Polycythemia 04/17/2015   BPH (benign prostatic hyperplasia) 04/17/2015   Seizure disorder ([place]) 10/12/2014   TIA (transient ischemic attack) 08/23/2013   Other and unspecified angina pectoris 07/04/2013   Nontoxic multinodular goiter 10/07/2012   Unstable angina ([place]) 06/19/2012   Carotid stenosis    PAD (peripheral artery disease) (HCC)    Left inguinal hernia 05/30/2012   COPD (chronic obstructive pulmonary disease) ([place]) 08/24/2011   S/P AAA repair 08/24/2011   Lumbar disc disease    Hypertensive heart disease    Hyperlipidemia    CAD (coronary artery disease)    History of pontine CVA    GERD  (gastroesophageal reflux disease)    PCP:  Benito Mccreedy, MD Pharmacy:   [facility] [place] [address] Phone: [phone] Fax: [phone]     Social Determinants of Health (SDOH) Interventions    Readmission Risk Interventions No flowsheet data found.

## 2021-03-12 NOTE — TOC Initial Note (Addendum)
Transition of Care (TOC) - Initial/Assessment Note    Patient Details  Name: Chris Payne MRN: [id number] Date of Birth: 02/24/1951  Transition of Care (TOC) CM/SW Contact:     M , LCSWA Phone Number: 03/12/2021, 3:18 PM  Clinical Narrative:                 CSW received SNF consult. CSW met with pt via phone, pt is covid positive. CSW introduced self and explained role at the hospital. Pt reports that PTA pt lived at home with his spouse, son-in-law and his girlfriend. PT reports pt needs assistance with ADLs. PT reports pt has a click score of 8 and is a x2 assist.  CSW reviewed PT/OT recommendations for SNF. Pt reports agreeable to a SNF. Pt gave CSW permission to fax out to facilities in the area. Pt has no preference of facility at this time. PT reports they are not vaccinated   CSW will continue to follow.    Expected Discharge Plan: Skilled Nursing Facility Barriers to Discharge: Continued Medical Work up   Patient Goals and CMS Choice Patient states their goals for this hospitalization and ongoing recovery are:: Rehab CMS Medicare.gov Compare Post Acute Care list provided to:: Patient Choice offered to / list presented to : Patient  Expected Discharge Plan and Services Expected Discharge Plan: Skilled Nursing Facility In-house Referral: Clinical Social Work   Post Acute Care Choice: Skilled Nursing Facility Living arrangements for the past 2 months: Single Family Home                                      Prior Living Arrangements/Services Living arrangements for the past 2 months: Single Family Home Lives with:: Spouse, Adult Children Patient language and need for interpreter reviewed:: Yes Do you feel safe going back to the place where you live?: Yes      Need for Family Participation in Patient Care: Yes (Comment) Care giver support system in place?: Yes (comment)   Criminal Activity/Legal Involvement Pertinent to Current  Situation/Hospitalization: No - Comment as needed  Activities of Daily Living Home Assistive Devices/Equipment: Bedside commode/3-in-1, Walker (specify type) ADL Screening (condition at time of admission) Patient's cognitive ability adequate to safely complete daily activities?: Yes Is the patient deaf or have difficulty hearing?: No Does the patient have difficulty seeing, even when wearing glasses/contacts?: No Does the patient have difficulty concentrating, remembering, or making decisions?: No Patient able to express need for assistance with ADLs?: Yes Does the patient have difficulty dressing or bathing?: Yes Independently performs ADLs?: No Communication: Independent Dressing (OT): Needs assistance Is this a change from baseline?: Pre-admission baseline Grooming: Needs assistance Is this a change from baseline?: Pre-admission baseline Feeding: Independent Bathing: Needs assistance Is this a change from baseline?: Pre-admission baseline Toileting: Needs assistance Is this a change from baseline?: Pre-admission baseline In/Out Bed: Needs assistance Is this a change from baseline?: Pre-admission baseline Walks in Home: Independent Does the patient have difficulty walking or climbing stairs?: Yes Weakness of Legs: Both Weakness of Arms/Hands: None  Permission Sought/Granted Permission sought to share information with : Family Supports, Facility Contact Representative Permission granted to share information with : Yes, Verbal Permission Granted  Share Information with NAME: Powers,Luther Other [phone]   [phone] son in-law POA  Permission granted to share info w AGENCY: SNF  Permission granted to share info w Relationship: Powers,Luther Other [phone]   [phone]   son in-law POA  Permission granted to share info w Contact Information: Powers,Luther Other [phone]   [phone] son in-law POA  Emotional Assessment Appearance:: Appears stated  age Attitude/Demeanor/Rapport: Unable to Assess Affect (typically observed): Unable to Assess Orientation: : Oriented to Place, Oriented to Self, Oriented to  Time, Oriented to Situation Alcohol / Substance Use: Not Applicable Psych Involvement: No (comment)  Admission diagnosis:  Shortness of breath [R06.02] Generalized weakness [R53.1] Acute hypoxemic respiratory failure due to COVID-19 ([place]) [U07.1, J96.01] COVID-19 virus infection [U07.1] Patient Active Problem List   Diagnosis Date Noted   Cellulitis of left lower extremity    Swelling of left foot    Effusion of left knee    Acute hypoxemic respiratory failure due to COVID-19 ([place]) 03/09/2021   AKI (acute kidney injury) ([place]) 03/09/2021   Shortness of breath 03/08/2021   HFrEF (heart failure with reduced ejection fraction) ([place]) 03/08/2021   Pressure injury of skin 03/08/2020   Candida rash of groin 10/19/2018   Acute on chronic systolic heart failure ([place]) 10/17/2018   Acute on chronic systolic (congestive) heart failure ([place]) 10/17/2018   COPD exacerbation (HCC) 05/21/2018   Chronic pain syndrome 07/19/2015   Degenerative disc disease, lumbar 07/19/2015   Weakness 04/17/2015   Generalized weakness 04/17/2015   Polycythemia 04/17/2015   BPH (benign prostatic hyperplasia) 04/17/2015   Seizure disorder ([place]) 10/12/2014   TIA (transient ischemic attack) 08/23/2013   Other and unspecified angina pectoris 07/04/2013   Nontoxic multinodular goiter 10/07/2012   Unstable angina (HCC) 06/19/2012   Carotid stenosis    PAD (peripheral artery disease) (HCC)    Left inguinal hernia 05/30/2012   COPD (chronic obstructive pulmonary disease) ([place]) 08/24/2011   S/P AAA repair 08/24/2011   Lumbar disc disease    Hypertensive heart disease    Hyperlipidemia    CAD (coronary artery disease)    History of pontine CVA    GERD (gastroesophageal reflux disease)    PCP:  Benito Mccreedy, MD Pharmacy:   [facility] [place], [place] - [address] [place] [address] Phone: [phone] Fax: [phone]     Social Determinants of Health (SDOH) Interventions    Readmission Risk Interventions No flowsheet data found.

## 2021-03-12 NOTE — Progress Notes (Signed)
Mobility Specialist Progress Note:   03/12/21 1150  Mobility  Activity Ambulated in room  Level of Assistance Maximum assist, patient does 25-49%  Assistive Device Front wheel walker  Distance Ambulated (ft) 8 ft  Mobility Ambulated with assistance in room  Mobility Response Tolerated fair  Mobility performed by Mobility specialist  $Mobility charge 1 Mobility  SATURATION QUALIFICATIONS: (This note is used to comply with regulatory documentation for home oxygen)  Patient Saturations on [employer] at Rest = 96%  Patient Saturations on [place] while Ambulating = 90%  Patient Saturations on 0 Liters of oxygen while Ambulating = n/a%  Pt received in bed willing to participate in mobility. ModA to get to EOB and to stand. Pt stated his legs felt weak and needed to sit. MaxA to stand from chair and ambulate back to bed. Call bell I reach and all needs met.   [facility] Public librarian Phone [phone] Secondary Phone [phone]

## 2021-03-12 NOTE — Progress Notes (Addendum)
HD#4 Subjective:  Overnight Events: None  Patient seen and assessed this morning at bedside.  Patient reports improved breathing this morning. He is able to be taken off oxygen while at rest without desaturation. He is informed of his MRI results and the plan for continuation of his oral antibiotics. He reports continued soreness of his left leg but does not have any other complaints.    Objective:  Vital signs in last 24 hours: Vitals:   03/11/21 2300 03/12/21 0100 03/12/21 0600 03/12/21 0627  BP: 139/84 (!) 147/72 138/88 (!) 153/76  Pulse: 96 73 76 76  Resp: (!) 27 13 15 17   Temp:    (!) 97.5 F (36.4 C)  TempSrc:    Oral  SpO2: 94% 97% 94%   Weight:      Height:       Supplemental O2: Nasal Cannula SpO2: 94 % O2 Flow Rate (L/min): 2 L/min   Physical Exam:  Physical Exam Constitutional:      Appearance: He is ill-appearing.  Cardiovascular:     Rate and Rhythm: Normal rate and regular rhythm.     Heart sounds: Normal heart sounds.     Comments: No observed JVD, dry MM Pulmonary:     Breath sounds: Wheezing present.  Abdominal:     Palpations: Abdomen is soft.  Musculoskeletal:     Comments: Improved LLE swelling and erythema, palpable DP pulse  Neurological:     General: No focal deficit present.     Mental Status: He is alert and oriented to person, place, and time.     Filed Weights   03/09/21 0421 03/10/21 0400 03/11/21 0400  Weight: 74.1 kg 76.4 kg 72.2 kg     Intake/Output Summary (Last 24 hours) at 03/12/2021 0716 Last data filed at 03/12/2021 0600 Gross per 24 hour  Intake 416.48 ml  Output 2750 ml  Net -2333.52 ml    Net IO Since Admission: -4,487.06 mL [03/12/21 0716]   Assessment/Plan:   Principal Problem:   COVID-19 Active Problems:   COPD (chronic obstructive pulmonary disease) (HCC)   PAD (peripheral artery disease) (HCC)   Acute kidney injury (HCC)   Effusion of left knee   Swelling of left foot   Patient  Summary:  Chris Payne is a 70 y.o. with pertinent PMH of COPD, HFrEF CAD s/p CABG, Ruptured AAA s/p repair, CVA, HTN, Prediabetes who presented with Shortness of breath and admit for acute hypoxic respiratory failure on hospital day 4.   Plan: #Acute Hypoxic Respiratory Failure 2/2 COVID-19 Infection Patient was able to be weaned off O2 today without desatting. Inflammatory markers are downtrending. The patient continues to improve.  - CRP 9.6 > 11.6 > 16.6 > 12 - Ferritin 473 > 515 > 555 > 513 - Obtain ambulatory pulse ox today - Continue dexamethasone 6mg  x 10 days (until 11/22) - Little benefit to remdesivir given time course - No toci or baricitinib given low supplemental oxygen requirement   #Cellulitis #Left Lower Extremity Swelling, erythema, and pain #PAD DVT study unremarkable. ABIs with moderate bilateral PVD and absent left great toe waveform on the left. Vascular surgery consulted and recommend outpatient follow up. MRI of LLE showing no evidence of osteo, remarkable for skin thickening and soft tissue edema consistent with cellulitis.  - Continue Keflex 500 mg q6hrs for 5 days course (until 11/19) - Continue aspirin and rosuvastatin for secondary prevention of PAD   #Traumatic Left Knee Effusion  Patient reports having left  knee swelling and pain upon palpation.  Patient reports having hit head against a wall.  Given synovial fluid culture and cell count, effusion was likely due to trauma as opposed to infection. -10 cc bloody fluid aspirated from knee, patient reports relief of knee pain  -Fluid showed: red, bloody, 8800 WBC, no growth < 24 hours  #AKI #Urinary retention possibly 2/2 BPH Cr up to 2.22, now downtrending to 1.76 s/p 1L IVF yesterday. - Repeat 1L LR today over 10hrs - Renal ultrasound showed no hydronephrosis - On Foley catheter  - Will need outpatient urology follow up   #COPD Patient does present with symptoms concerning for possible COPD  exacerbation with 2/3 cardinal symptoms. Home meds include albuterol, Symbicort, and Spiriva. No previous PFTs.  -Decadron 6 mg daily -Continue home meds of albuterol, Dulera, and Incruse Ellipta   #HFpEF (EF of 55-60%) #Ischemic Cardiomyopathy: Patient had a stress test performed in 2020 which showed a mildly reduced EF of 40%. His home meds include furosemide 20 mg daily, metoprolol 25 mg BID, and Entresto 24-26 mg  every other day, but he states that he is only taking the metoprolol and is not on any diuretics at home. He does not have a recent Echo on file. Appears hypovolemic on exam.  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 His home meds include ASA 81 mg daily and rosuvastatin 40 mg daily. Spoke with patient's son who states that they he was recently evaluated for possible carotid aneurysm in the past month.  He was unable to provide significant information regarding this visit.  He does have a significant history of carotid stenosis left greater than right.  We will consider further evaluation of this in the near future but currently asymptomatic with no chest pain, palpitations, or altered mental status.  Additionally, he has cool lower extremities with a history of peripheral artery disease. - Continue ASA and rosuvastatin   #Prediabetes: His last a1c was 6.0 a year ago. He is currently on metformin 250 mg BID.  Will likely have hyperglycemia in the setting of steroid use. -CBG monitoring -SSI   #Degenerative Disk Disease #Chronic Pain Syndrome: -Gabapentin 300 mg BID.    #BPH: -Tamsulosin 0.4 mg daily.    GERD: - continue PPI daily.    Decubitus Pressure Wound, Grade 1-2 - Sacral pressure pad to offload his sacrum  Diet: Heart Healthy IVF: LR 100 mL/hr for 10 hrs VTE: Enoxaparin Code: Full PT/OT recs: SNF for Subacute PT, none. TOC recs: pending   Dispo: Anticipated discharge to Skilled nursing facility in 5 days pending improved breathing and appropriate management of COVID symptoms.   Corky Sox, MD 03/12/2021, 7:16 AM Pager: [phone]  Please contact the on call pager after 5 pm and on weekends at [phone].

## 2021-03-12 NOTE — Care Management Important Message (Signed)
Important Message  Patient Details  Name: Chris Payne MRN: [id number] Date of Birth: 1950-11-10   Medicare Important Message Given:  Yes     Shelda Altes 03/12/2021, 9:32 AM

## 2021-03-12 NOTE — TOC Progression Note (Addendum)
Transition of Care [facility]) - Progression Note    Patient Details  Name: Chris Payne MRN: [id number] Date of Birth: 1950/10/29  [facility] CM/SW Contact  Reece Agar, [place] Phone Number: 03/12/2021, 11:48 AM  Clinical Narrative:    Csw spoke with Juliann Pulse at [facility] who has offered pt a bed after quarantine days are complete. For now this is the only facility that has accepted, pt would like to wait a couple more hours for more possible bed offers.        Expected Discharge Plan and Services                                                 Social Determinants of Health (SDOH) Interventions    Readmission Risk Interventions No flowsheet data found.

## 2021-03-13 ENCOUNTER — Other Ambulatory Visit (HOSPITAL_COMMUNITY): Payer: Self-pay

## 2021-03-13 DIAGNOSIS — R7303 Prediabetes: Secondary | ICD-10-CM

## 2021-03-13 DIAGNOSIS — K219 Gastro-esophageal reflux disease without esophagitis: Secondary | ICD-10-CM

## 2021-03-13 DIAGNOSIS — I5030 Unspecified diastolic (congestive) heart failure: Secondary | ICD-10-CM

## 2021-03-13 DIAGNOSIS — I25110 Atherosclerotic heart disease of native coronary artery with unstable angina pectoris: Secondary | ICD-10-CM

## 2021-03-13 DIAGNOSIS — N401 Enlarged prostate with lower urinary tract symptoms: Secondary | ICD-10-CM

## 2021-03-13 DIAGNOSIS — U071 COVID-19: Secondary | ICD-10-CM

## 2021-03-13 DIAGNOSIS — N179 Acute kidney failure, unspecified: Secondary | ICD-10-CM

## 2021-03-13 DIAGNOSIS — I739 Peripheral vascular disease, unspecified: Secondary | ICD-10-CM

## 2021-03-13 DIAGNOSIS — M25462 Effusion, left knee: Secondary | ICD-10-CM

## 2021-03-13 DIAGNOSIS — L03116 Cellulitis of left lower limb: Secondary | ICD-10-CM

## 2021-03-13 DIAGNOSIS — J440 Chronic obstructive pulmonary disease with acute lower respiratory infection: Secondary | ICD-10-CM

## 2021-03-13 DIAGNOSIS — M519 Unspecified thoracic, thoracolumbar and lumbosacral intervertebral disc disorder: Secondary | ICD-10-CM

## 2021-03-13 DIAGNOSIS — R338 Other retention of urine: Secondary | ICD-10-CM

## 2021-03-13 DIAGNOSIS — G894 Chronic pain syndrome: Secondary | ICD-10-CM

## 2021-03-13 DIAGNOSIS — I255 Ischemic cardiomyopathy: Secondary | ICD-10-CM

## 2021-03-13 DIAGNOSIS — J9601 Acute respiratory failure with hypoxia: Secondary | ICD-10-CM

## 2021-03-13 DIAGNOSIS — E785 Hyperlipidemia, unspecified: Secondary | ICD-10-CM

## 2021-03-13 LAB — GLUCOSE, CAPILLARY
Glucose-Capillary: 194 mg/dL — ABNORMAL HIGH (ref 70–99)
Glucose-Capillary: 222 mg/dL — ABNORMAL HIGH (ref 70–99)
Glucose-Capillary: 172 mg/dL — ABNORMAL HIGH (ref 70–99)

## 2021-03-13 LAB — BASIC METABOLIC PANEL
Anion gap: 7 (ref 5–15)
BUN: 38 mg/dL — ABNORMAL HIGH (ref 8–23)
CO2: 23 mmol/L (ref 22–32)
Calcium: 8 mg/dL — ABNORMAL LOW (ref 8.9–10.3)
Chloride: 105 mmol/L (ref 98–111)
Creatinine, Ser: 1.7 mg/dL — ABNORMAL HIGH (ref 0.61–1.24)
GFR, Estimated: 43 mL/min — ABNORMAL LOW (ref >60)
Glucose, Bld: 225 mg/dL — ABNORMAL HIGH (ref 70–99)
Potassium: 3.9 mmol/L (ref 3.5–5.1)
Sodium: 135 mmol/L (ref 135–145)

## 2021-03-13 LAB — CBC WITH DIFFERENTIAL/PLATELET
Abs Immature Granulocytes: 0.03 10*3/uL (ref 0.00–0.07)
Basophils Absolute: 0 10*3/uL (ref 0.0–0.1)
Basophils Relative: 0 %
Eosinophils Absolute: 0 10*3/uL (ref 0.0–0.5)
Eosinophils Relative: 0 %
HCT: 40.2 % (ref 39.0–52.0)
Hemoglobin: 13.6 g/dL (ref 13.0–17.0)
Immature Granulocytes: 1 %
Lymphocytes Relative: 7 %
Lymphs Abs: 0.3 10*3/uL — ABNORMAL LOW (ref 0.7–4.0)
MCH: 33.3 pg (ref 26.0–34.0)
MCHC: 33.8 g/dL (ref 30.0–36.0)
MCV: 98.3 fL (ref 80.0–100.0)
Monocytes Absolute: 0.6 10*3/uL (ref 0.1–1.0)
Monocytes Relative: 13 %
Neutro Abs: 3.6 10*3/uL (ref 1.7–7.7)
Neutrophils Relative %: 79 %
Platelets: 200 10*3/uL (ref 150–400)
RBC: 4.09 MIL/uL — ABNORMAL LOW (ref 4.22–5.81)
RDW: 12.8 % (ref 11.5–15.5)
WBC: 4.6 10*3/uL (ref 4.0–10.5)
nRBC: 0 % (ref 0.0–0.2)

## 2021-03-13 LAB — D-DIMER, QUANTITATIVE: D-Dimer, Quant: 5.71 ug/mL-FEU — ABNORMAL HIGH (ref 0.00–0.50)

## 2021-03-13 LAB — C-REACTIVE PROTEIN: CRP: 5.2 mg/dL — ABNORMAL HIGH (ref <1.0)

## 2021-03-13 LAB — FERRITIN: Ferritin: 382 ng/mL — ABNORMAL HIGH (ref 24–336)

## 2021-03-13 MED ORDER — GABAPENTIN 600 MG PO TABS: 600.0000 mg | ORAL_TABLET | Freq: Every day | ORAL | Status: DC

## 2021-03-13 MED ORDER — DEXAMETHASONE 6 MG PO TABS
6.0000 mg | ORAL_TABLET | ORAL | 0 refills | Status: AC
  Filled 2021-03-13: qty 5, 5d supply, fill #0

## 2021-03-13 MED ORDER — CEPHALEXIN 500 MG PO CAPS
500.0000 mg | ORAL_CAPSULE | Freq: Four times a day (QID) | ORAL | 0 refills | Status: AC
  Filled 2021-03-13: qty 8, 2d supply, fill #0

## 2021-03-13 NOTE — Progress Notes (Signed)
Occupational Therapy Treatment Patient Details Name: Chris Payne MRN: [id number] DOB: [birth year] Today's Date: 03/13/2021   History of present illness Pt is a 70 y.o. male who presented 03/07/21 with acute onset of SOB and recent diagnosis of COVID-19. PMH of COPD, HFrEFm CAD s/p CABG, Ruptured AAA s/p repair, CVA, HTN, Prediabetes   OT comments  Moderate assistance to achieve sitting EOB with cues to sequence. Stood x 3 from elevated bed with RW for pericare and transferred bed to chair with heavy mod assist. Pt with flexed posture. Changed soiled gown with set up and completed seated grooming with set up. Pt unaware of bowel incontinence. Assist needed to place call and to use remote for tv.    Recommendations for follow up therapy are one component of a multi-disciplinary discharge planning process, led by the attending physician.  Recommendations may be updated based on patient status, additional functional criteria and insurance authorization.    Follow Up Recommendations  Skilled nursing-short term rehab (<3 hours/day)    Assistance Recommended at Discharge Frequent or constant Supervision/Assistance  Equipment Recommendations  BSC/3in1;Other (comment) (defer to next venue)    Recommendations for Other Services      Precautions / Restrictions Precautions Precautions: Fall;Other (comment) Precaution Comments: COVID+, monitor SpO2       Mobility Bed Mobility Overal bed mobility: Needs Assistance Bed Mobility: Supine to Sit     Supine to sit: Mod assist     General bed mobility comments: cues for sequencing, assist for LEs over EOB, hips to EOB with bed pad and pulled up on therapist's hand    Transfers Overall transfer level: Needs assistance Equipment used: Rolling walker (2 wheels) Transfers: Sit to/from Stand Sit to Stand: Mod assist Stand pivot transfers: Mod assist         General transfer comment: assist to rise and steady from elevated bed and to take  pivotal steps to recliner, stood x 3 for pericare prior to transfer     Balance Overall balance assessment: Needs assistance   Sitting balance-Leahy Scale: Fair     Standing balance support: Reliant on assistive device for balance Standing balance-Leahy Scale: Poor Standing balance comment: RW and external assist, flexed posture                           ADL either performed or assessed with clinical judgement   ADL Overall ADL's : Needs assistance/impaired Eating/Feeding: Set up Eating/Feeding Details (indicate cue type and reason): in chair Grooming: Wash/dry hands;Wash/dry face;Set up;Sitting           Upper Body Dressing : Set up;Bed level           Toileting- Clothing Manipulation and Hygiene: Total assistance;Sit to/from stand              Extremity/Trunk Assessment              Vision       Perception     Praxis      Cognition Arousal/Alertness: Awake/alert Behavior During Therapy: WFL for tasks assessed/performed Overall Cognitive Status: Impaired/Different from baseline Area of Impairment: Memory;Following commands;Safety/judgement;Problem solving                     Memory: Decreased short-term memory Following Commands: Follows one step commands with increased time Safety/Judgement: Decreased awareness of safety;Decreased awareness of deficits   Problem Solving: Slow processing;Decreased initiation;Difficulty sequencing;Requires verbal cues;Requires tactile cues General Comments: pt layin in  BM, unaware          Exercises     Shoulder Instructions       General Comments      Pertinent Vitals/ Pain       Pain Assessment: Faces Faces Pain Scale: Hurts little more Pain Location: L knee and leg; back Pain Descriptors / Indicators: Discomfort;Grimacing Pain Intervention(s): Monitored during session;Repositioned  Home Living                                          Prior  Functioning/Environment              Frequency  Min 2X/week        Progress Toward Goals  OT Goals(current goals can now be found in the care plan section)  Progress towards OT goals: Progressing toward goals  Acute Rehab OT Goals OT Goal Formulation: With patient Time For Goal Achievement: 03/23/21 Potential to Achieve Goals: Good  Plan Discharge plan remains appropriate;Frequency remains appropriate    Co-evaluation                 AM-PAC OT "6 Clicks" Daily Activity     Outcome Measure   Help from another person eating meals?: None Help from another person taking care of personal grooming?: A Little Help from another person toileting, which includes using toliet, bedpan, or urinal?: Total Help from another person bathing (including washing, rinsing, drying)?: A Lot Help from another person to put on and taking off regular upper body clothing?: A Little Help from another person to put on and taking off regular lower body clothing?: Total 6 Click Score: 14    End of Session Equipment Utilized During Treatment: Rolling walker (2 wheels);Gait belt  OT Visit Diagnosis: Unsteadiness on feet (R26.81);Other abnormalities of gait and mobility (R26.89);Muscle weakness (generalized) (M62.81)   Activity Tolerance Patient tolerated treatment well   Patient Left in chair;with call bell/phone within reach;with chair alarm set   Nurse Communication Mobility status;Other (comment) (aware pt is incontinent)        Time: 1916-6060 OT Time Calculation (min): 35 min  Charges: OT General Charges $OT Visit: 1 Visit OT Treatments $Self Care/Home Management : 23-37 mins  Nestor Lewandowsky, OTR/L Acute Rehabilitation Services Pager: [phone] Office: [phone]  Malka So 03/13/2021, 12:00 PM

## 2021-03-13 NOTE — TOC Progression Note (Signed)
Transition of Care [facility]) - Progression Note    Patient Details  Name: Chris Payne MRN: [id number] Date of Birth: [birth year]  [facility] CM/SW Contact  Zenon Mayo, RN Phone Number: 03/13/2021, 2:26 PM  Clinical Narrative:    NCM  spoke with patient, was informed that he does not want to go to SNF he wants to go home with [facility] services.  Son, [name] , the son states he has 4 people with him around the clock at all times and the will assist him with what ever he needs and he has an aide who stays over night with him.  NCM offered choice for [facility] , son states he does not have a preference, NCM made referral to [facility] with [facility] for [place], [place], he states he can take referral.  Soc will be on a little delay.  NCM spoke with MD , he states he wants to do a voiding trial for a couple hrs then dc him home.     Expected Discharge Plan: [place] Barriers to Discharge: Continued Medical Work up  Expected Discharge Plan and Services Expected Discharge Plan: [place] In-house Referral: Clinical Social Work   Post Acute Care Choice: [place] Living arrangements for the past 2 months: Single Family Home                                       Social Determinants of Health (SDOH) Interventions    Readmission Risk Interventions No flowsheet data found.

## 2021-03-13 NOTE — Discharge Instructions (Addendum)
You were admitted with a Covid-19 infection that caused you to require oxygen to breath effectively. You currently are not requiring oxygen and are ready for discharge. You developed a soft tissue infection of your left leg. You need antibiotics for this (Keflex), please continue to take this medicine. You also had kidney injury, which we expected to improve over the subsequent days. Please follow up with your primary doctor within 5 days.

## 2021-03-13 NOTE — Discharge Summary (Addendum)
Name: Chris Payne MRN: [id number] DOB: 03/05/1951 70 y.o. PCP: Benito Mccreedy, MD  Date of Admission: 03/07/2021  4:10 PM Date of Discharge: 03/13/2021 Attending Physician: Velna Ochs, MD  Discharge Diagnosis: 1.  Acute hypoxic respiratory failure secondary to COVID 2.  Cellulitis of the left lower extremity 3.  PAD 4.  Traumatic left knee effusion 5.  AKI 6.  Urinary retention 7.  BPH 8.  COPD  Discharge Medications: Allergies as of 03/13/2021   No Known Allergies      Medication List     STOP taking these medications    doxycycline 100 MG capsule Commonly known as: VIBRAMYCIN   Entresto 24-26 MG Generic drug: sacubitril-valsartan   gabapentin 600 MG tablet Commonly known as: NEURONTIN   metFORMIN 500 MG tablet Commonly known as: GLUCOPHAGE       TAKE these medications    albuterol 108 (90 Base) MCG/ACT inhaler Commonly known as: VENTOLIN HFA Inhale 2 puffs into the lungs every 6 (six) hours as needed for wheezing or shortness of breath.   aspirin 81 MG EC tablet Take 1 tablet (81 mg total) by mouth daily.   budesonide-formoterol 160-4.5 MCG/ACT inhaler Commonly known as: Symbicort Inhale 2 puffs into the lungs 2 (two) times daily.   cephALEXin 500 MG capsule Commonly known as: KEFLEX Take 1 capsule (500 mg total) by mouth every 6 (six) hours for 2 days.   dexamethasone 6 MG tablet Commonly known as: DECADRON Take 1 tablet (6 mg total) by mouth daily for 5 days. Start taking on: March 14, 2021   furosemide 20 MG tablet Commonly known as: LASIX Take 1 tablet (20 mg total) by mouth daily.   metoprolol tartrate 25 MG tablet Commonly known as: LOPRESSOR Take 1 tablet (25 mg total) by mouth 2 (two) times daily.   nystatin powder Commonly known as: MYCOSTATIN/NYSTOP Apply topically 3 (three) times daily.   pantoprazole 40 MG tablet Commonly known as: PROTONIX Take 40 mg by mouth daily.   rosuvastatin 40 MG  tablet Commonly known as: CRESTOR Take 1 tablet (40 mg total) by mouth daily.   Spiriva HandiHaler 18 MCG inhalation capsule Generic drug: tiotropium Place 1 capsule (18 mcg total) into inhaler and inhale daily.   tamsulosin 0.4 MG Caps capsule Commonly known as: FLOMAX Take 0.4 mg by mouth daily.   triamcinolone cream 0.1 % Commonly known as: KENALOG Apply 1 application topically 2 (two) times daily.        Disposition and follow-up:   [name] was discharged from [facility] in Stable condition.  At the hospital follow up visit please address:  1.  AKI -please recheck BMP.  Gabapentin and Entresto held at discharge.  Recommended to resume in 3 days.  Urinary retention-patient required Foley catheter during hospitalization due to urinary retention presumed to be secondary to BPH.  Please follow-up at next visit  Left lower extremity cellulitis-discharged to complete 5 days of Keflex.  Please assess for improvement of lower extremity edema and erythema.  May need longer course of antibiotics  2.  Labs / imaging needed at time of follow-up: BMP  3.  Pending labs/ test needing follow-up: none  Follow-up Appointments:  Follow-up Information     Care, [facility] Follow up.   Specialty: Home Health Services Why: [place], [place], Social Worker, [place] information: [place] [address] [phone]  Hospital Course by problem list:  #Acute respiratory failure secondary to COVID-19 #COPD Patient initially presented with significant shortness of breath and required supplemental oxygen.  Remains afebrile without leukocytosis or systemic signs of infection.  Chest x-ray was reassuring without any significant consolidation.  He does have a significant history of COPD with multiple admissions for COPD exacerbations in the past which could be contributing to his presenting symptoms.  Treated  with dexamethasone for total of 10 days.  Remdesivir was not initiated due to limited benefit. Recommended SNF at discharge for deconditioning however patient declined and discharged home.  #LLE Cellulitis #Left Lower Extremity Swelling, erythema, and pain #PAD Patient presents with new left lower extremity swelling out of proportion to his right lower extremity.  Imaging negative for DVT. ABIs with moderate bilateral PVD and absent left great toe waveform on the left. Vascular surgery consulted and recommend outpatient follow up.  Continue aspirin and rosuvastatin for secondary prevention.  Concern for superimposed cellulitis so started on Keflex for total of 5 days.  He may need a longer course, please reassess outpatient.  #AKI #Urinary retention possibly 2/2 BPH Patient presented with an acute elevation in his creatinine from a baseline of 0.95-1.34 with a GFR of 57.  He does endorse decreased p.o. intake over the last week raising concern for possible prerenal azotemia.  However he does have a BUN/creatinine ratio of 10 raising concern for intrarenal or postrenal azotemia. He does have a history of BPH on tamsulosin.  Differential for AKI include COVID 19 infection vs urinary retention due to BPH.  He did require Foley catheter placement.  Voiding trial attempted day of discharge.  Plan to place Foley catheter if patient continues to retain with close follow-up outpatient with urology.  #Left Knee Effusion Patient had left knee swelling and pain status post hitting his knee against a wall.  Had the left knee aspirated approximately 10 cc of bloody fluid removed.     #HFpEF (EF of 55-60%) #Ischemic Cardiomyopathy: Patient had a stress test performed in 2020 which showed a mildly reduced EF of 40%. His home meds include furosemide 20 mg daily, metoprolol 25 mg BID, and Entresto 24-26 mg  every other day, but he states that he is only taking the metoprolol and is not on any diuretics at home. He  does not have a recent Echo on file. Held home entresto and lasix given AKI. Continued home metoprolol.   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 His home meds include ASA 81 mg daily and rosuvastatin 40 mg daily. Spoke with patient's son who states that they he was recently evaluated for possible carotid aneurysm in the past month.  He was unable to provide significant information regarding this visit.  He does have a significant history of carotid stenosis left greater than right.  We will consider further evaluation of this in the near future but currently asymptomatic with no chest pain, palpitations, or altered mental status.  Additionally, he has cool lower extremities with a history of peripheral artery disease. Continued home ASA and rosuvastatin   #Prediabetes: His last a1c was 6.0 a year ago. He is currently on metformin 250 mg BID.  Patient had hyperglycemia secondary to seroids for covid, requiring sliding scale and small amounts of prandial  insulin.    #Degenerative Disk Disease #Chronic Pain Syndrome: Held Gabapentin 300 mg BID at discharge due to improving AKI.  Recommended resuming in about 3 days.   #BPH: Continued continued tamsulosin 0.4 mg daily.    GERD: continued PPI daily.   Decubitus Pressure Wound, Grade 1-2 Sacral pressure pad placed to offload his sacrum  Discharge Exam:   BP 112/74 (BP Location: Right Arm)   Pulse 78   Temp 98.3 F (36.8 C) (Oral)   Resp 19   Ht 5\' 7"  (1.702 m)   Wt 159 lb 2.8 oz (72.2 kg)   SpO2 93%   BMI 24.93 kg/m  Discharge exam:  Physical Exam General: alert, appears stated age, in no acute distress HEENT: Normocephalic, atraumatic, EOM intact, conjunctiva normal CV: Regular rate and  rhythm, no murmurs rubs or gallops Pulm: wheezing bilateral lung fields, normal work of breathing Abdomen: Soft, nondistended, bowel sounds present, no tenderness to palpation MSK: LLE edema and erythema improved Skin: Warm and dry Neuro: Alert and oriented x3   Pertinent Labs, Studies, and Procedures:  CBC Latest Ref Rng & Units 03/13/2021 03/12/2021 03/11/2021  WBC 4.0 - 10.5 K/uL 4.6 3.7(L) 5.0  Hemoglobin 13.0 - 17.0 g/dL 13.6 14.0 14.0  Hematocrit 39.0 - 52.0 % 40.2 41.9 42.0  Platelets 150 - 400 K/uL 200 179 140(L)   CMP Latest Ref Rng & Units 03/13/2021 03/12/2021 03/11/2021  Glucose 70 - 99 mg/dL 225(H) 182(H) 167(H)  BUN 8 - 23 mg/dL 38(H) 38(H) 33(H)  Creatinine 0.61 - 1.24 mg/dL 1.70(H) 1.76(H) 2.14(H)  Sodium 135 - 145 mmol/L 135 136 135  Potassium 3.5 - 5.1 mmol/L 3.9 4.0 3.8  Chloride 98 - 111 mmol/L 105 104 104  CO2 22 - 32 mmol/L 23 25 24   Calcium 8.9 - 10.3 mg/dL 8.0(L) 8.1(L) 8.0(L)  Total Protein 6.5 - 8.1 g/dL - - -  Total Bilirubin 0.3 - 1.2 mg/dL - - -  Alkaline Phos 38 - 126 U/L - - -  AST 15 - 41 U/L - - -  ALT 0 - 44 U/L - - -    MR FOOT LEFT W WO CONTRAST  Result Date: 03/11/2021 CLINICAL DATA:  Pain and swelling of left lower extremity. Osteomyelitis suspected. EXAM: MRI OF THE LEFT FOREFOOT WITHOUT AND WITH CONTRAST TECHNIQUE: Multiplanar, multisequence MR imaging of the left foot was performed both before and after administration of intravenous contrast. CONTRAST:  26mL GADAVIST GADOBUTROL 1 MMOL/ML IV SOLN COMPARISON:  None. FINDINGS: Bones/Joint/Cartilage No fracture or dislocation. Normal alignment. No joint effusion. No marrow signal abnormality. No abnormal enhancement. Ligaments Collateral ligaments are intact.  Lisfranc ligament is intact. Muscles and Tendons Flexor, peroneal and extensor compartment tendons are intact. Increased intrasubstance signal of the plantar muscles concerning for myopathy/myositis. No drainable fluid collection. Soft tissue  Marked subcutaneous soft tissue edema prominent about the dorsum of the foot. No soft tissue mass. IMPRESSION: 1. No MRI evidence of osteomyelitis. 2. No appreciable tendon or ligamentous injury. 3. Marked subcutaneous soft tissue edema about the ankle and foot. 4. No evidence of fracture or dislocation. Electronically Signed   By: Keane Police D.O.   On: 03/11/2021 18:05   MR TIBIA FIBULA LEFT W WO CONTRAST  Result Date: 03/11/2021 CLINICAL DATA:  Suspected osteomyelitis of tibia/fibula, left lower extremity swelling and pain. EXAM: MRI OF LOWER LEFT EXTREMITY WITHOUT AND WITH CONTRAST TECHNIQUE: Multiplanar, multisequence MR imaging of the left tibia/fibula was performed both before and after administration of intravenous  contrast. CONTRAST:  47mL GADAVIST GADOBUTROL 1 MMOL/ML IV SOLN COMPARISON:  None. FINDINGS: Bones/Joint/Cartilage The marrow signal is within normal limits. No evidence of fracture or osteonecrosis. No MR evidence of osteomyelitis. No abnormal enhancement. Ligaments Intact Muscles and Tendons Mild muscle edema about the posterior aspect of the gastrocnemius muscles. No intramuscular fluid collection or abscess. Soft tissues There is marked skin thickening and subcutaneous soft tissue edema extending from the knee to the ankle consistent with cellulitis. IMPRESSION: 1.  No MR evidence of osteomyelitis or acute osseous abnormality. 2. Mild edema of the medial head of the gastrocnemius, which may be reactive secondary to adjacent infectious/inflammatory process or diffuse cellulitis. 3. Skin thickening and marked subcutaneous soft tissue edema throughout the left lower extremity consistent with cellulitis. Electronically Signed   By: Keane Police D.O.   On: 03/11/2021 18:35   DG Knee Complete 4 Views Left  Result Date: 03/10/2021 CLINICAL DATA:  Left knee pain after fall. EXAM: LEFT KNEE - COMPLETE 4+ VIEW COMPARISON:  None. FINDINGS: The bones are diffusely under mineralized. Patient had  difficulty with positioning leading to non traditional views obtained. No evidence of acute fracture. No dislocation. There is no significant knee joint effusion. Minimal degenerative change with peripheral spurring. More detailed assessment is limited. IMPRESSION: 1. No acute fracture or subluxation of the left knee. 2. Osteopenia/osteoporosis. Mild degenerative change. Electronically Signed   By: Keith Rake M.D.   On: 03/10/2021 15:41     Discharge Instructions: Discharge Instructions     Diet - low sodium heart healthy   Complete by: As directed    Face-to-face encounter (required for Medicare/Medicaid patients)   Complete by: As directed    I Corky Sox certify that this patient is under my care and that I, or a nurse practitioner or physician's assistant working with me, had a face-to-face encounter that meets the physician face-to-face encounter requirements with this patient on 03/13/2021. The encounter with the patient was in whole, or in part for the following medical condition(s) which is the primary reason for home health care (List medical condition): deconditioning.   The encounter with the patient was in whole, or in part, for the following medical condition, which is the primary reason for home health care: deconditioning   I certify that, based on my findings, the following services are medically necessary home health services: Physical therapy   Reason for Medically Necessary Home Health Services: Therapy- Therapeutic Exercises to Increase Strength and Endurance   My clinical findings support the need for the above services: Unsafe ambulation due to balance issues   Further, I certify that my clinical findings support that this patient is homebound due to: Ambulates short distances less than 300 feet   Face-to-face encounter (required for Medicare/Medicaid patients)   Complete by: As directed    I Corky Sox certify that this patient is under my care and that I, or a  nurse practitioner or physician's assistant working with me, had a face-to-face encounter that meets the physician face-to-face encounter requirements with this patient on 03/13/2021. The encounter with the patient was in whole, or in part for the following medical condition(s) which is the primary reason for home health care (List medical condition): deconditioning   The encounter with the patient was in whole, or in part, for the following medical condition, which is the primary reason for home health care: deconditioning   I certify that, based on my findings, the following services are medically necessary home health services:  Physical therapy   Reason for Medically Necessary Home Health Services: Therapy- Home Adaptation to Facilitate Safety   My clinical findings support the need for the above services: Unsafe ambulation due to balance issues   Further, I certify that my clinical findings support that this patient is homebound due to: Ambulates short distances less than 300 feet   Home Health   Complete by: As directed    To provide the following care/treatments:  PT [place]   Complete by: As directed    To provide the following care/treatments: Social work   Increase activity slowly   Complete by: As directed        Signed: Corky Sox, MD PGY-1 Pager: [phone]

## 2021-03-13 NOTE — TOC Transition Note (Addendum)
Transition of Care [facility]) - CM/SW Discharge Note   Patient Details  Name: Chris Payne MRN: [id number] Date of Birth: Oct 26, 1950  [facility] of [facility]) CM/SW Contact:  Zenon Mayo, RN Phone Number: 03/13/2021, 2:40 PM   Clinical Narrative:    NCM  spoke with patient, was informed that he does not want to go to SNF he wants to go home with [facility] services.  Son, [name] , the son states he has 4 people with him around the clock at all times and the will assist him with what ever he needs and he has an aide who stays over night with him.  NCM offered choice for [facility] , son states he does not have a preference, NCM made referral to [place] with Alvis Lemmings for [place], [place], and Education officer, museum,  he states he can take referral.  Soc will be on a little delay.  NCM spoke with MD , he states he wants to do a voiding trial for a couple hrs then dc him home.  This NCM informed son that the patient needs Mod ast to walk, and to get up from bed, son states they help him to do this at home.  NCM informed MD of this conversation.      Final next level of care: [place] Barriers to Discharge: No Barriers Identified   Patient Goals and CMS Choice Patient states their goals for this hospitalization and ongoing recovery are:: return home with [facility], refusing SNF CMS Medicare.gov Compare Post Acute Care list provided to:: Patient Choice offered to / list presented to : Patient  Discharge Placement                       Discharge Plan and Services In-house Referral: Clinical Social Work   Post Acute Care Choice: [place]            DME Agency: NA       HH Arranged: PT, OT, Social Work , Aide [place] Agency: [place] Date [place]: 03/13/21 Time [place]: 3335 Representative spoke with at [place]: [place] ([place]) Interventions     Readmission Risk Interventions No flowsheet data  found.

## 2021-03-14 LAB — BODY FLUID CULTURE W GRAM STAIN: Culture: NO GROWTH

## 2021-03-17 ENCOUNTER — Emergency Department (HOSPITAL_COMMUNITY)
Admission: EM | Admit: 2021-03-17 | Discharge: 2021-03-18 | Disposition: A | Discharge status: Skilled Nursing Facility | Payer: Medicare Other | Attending: Emergency Medicine | Admitting: Emergency Medicine

## 2021-03-17 ENCOUNTER — Other Ambulatory Visit: Payer: Self-pay

## 2021-03-17 ENCOUNTER — Emergency Department (HOSPITAL_COMMUNITY): Payer: Medicare Other

## 2021-03-17 ENCOUNTER — Encounter (HOSPITAL_COMMUNITY): Payer: Self-pay | Admitting: Emergency Medicine

## 2021-03-17 DIAGNOSIS — I110 Hypertensive heart disease with heart failure: Secondary | ICD-10-CM | POA: Insufficient documentation

## 2021-03-17 DIAGNOSIS — R296 Repeated falls: Secondary | ICD-10-CM | POA: Diagnosis not present

## 2021-03-17 DIAGNOSIS — S8992XA Unspecified injury of left lower leg, initial encounter: Secondary | ICD-10-CM | POA: Diagnosis present

## 2021-03-17 DIAGNOSIS — R262 Difficulty in walking, not elsewhere classified: Secondary | ICD-10-CM

## 2021-03-17 DIAGNOSIS — J449 Chronic obstructive pulmonary disease, unspecified: Secondary | ICD-10-CM | POA: Insufficient documentation

## 2021-03-17 DIAGNOSIS — R2689 Other abnormalities of gait and mobility: Secondary | ICD-10-CM | POA: Diagnosis not present

## 2021-03-17 DIAGNOSIS — Z87891 Personal history of nicotine dependence: Secondary | ICD-10-CM | POA: Diagnosis not present

## 2021-03-17 DIAGNOSIS — Z7982 Long term (current) use of aspirin: Secondary | ICD-10-CM | POA: Insufficient documentation

## 2021-03-17 DIAGNOSIS — Z043 Encounter for examination and observation following other accident: Secondary | ICD-10-CM | POA: Diagnosis not present

## 2021-03-17 DIAGNOSIS — I2510 Atherosclerotic heart disease of native coronary artery without angina pectoris: Secondary | ICD-10-CM | POA: Diagnosis not present

## 2021-03-17 DIAGNOSIS — U071 COVID-19: Secondary | ICD-10-CM | POA: Diagnosis not present

## 2021-03-17 DIAGNOSIS — W1839XA Other fall on same level, initial encounter: Secondary | ICD-10-CM | POA: Diagnosis not present

## 2021-03-17 DIAGNOSIS — R531 Weakness: Secondary | ICD-10-CM | POA: Diagnosis not present

## 2021-03-17 DIAGNOSIS — S51811A Laceration without foreign body of right forearm, initial encounter: Secondary | ICD-10-CM | POA: Insufficient documentation

## 2021-03-17 DIAGNOSIS — S81011A Laceration without foreign body, right knee, initial encounter: Secondary | ICD-10-CM | POA: Insufficient documentation

## 2021-03-17 DIAGNOSIS — S81012A Laceration without foreign body, left knee, initial encounter: Secondary | ICD-10-CM | POA: Diagnosis not present

## 2021-03-17 DIAGNOSIS — Z79899 Other long term (current) drug therapy: Secondary | ICD-10-CM | POA: Insufficient documentation

## 2021-03-17 DIAGNOSIS — S81822A Laceration with foreign body, left lower leg, initial encounter: Secondary | ICD-10-CM | POA: Insufficient documentation

## 2021-03-17 DIAGNOSIS — I5023 Acute on chronic systolic (congestive) heart failure: Secondary | ICD-10-CM | POA: Insufficient documentation

## 2021-03-17 DIAGNOSIS — Z743 Need for continuous supervision: Secondary | ICD-10-CM | POA: Diagnosis not present

## 2021-03-17 DIAGNOSIS — S51812A Laceration without foreign body of left forearm, initial encounter: Secondary | ICD-10-CM | POA: Diagnosis not present

## 2021-03-17 LAB — COMPREHENSIVE METABOLIC PANEL
ALT: 53 U/L — ABNORMAL HIGH (ref 0–44)
AST: 27 U/L (ref 15–41)
Albumin: 2.9 g/dL — ABNORMAL LOW (ref 3.5–5.0)
Alkaline Phosphatase: 96 U/L (ref 38–126)
Anion gap: 7 (ref 5–15)
BUN: 44 mg/dL — ABNORMAL HIGH (ref 8–23)
CO2: 28 mmol/L (ref 22–32)
Calcium: 8.6 mg/dL — ABNORMAL LOW (ref 8.9–10.3)
Chloride: 104 mmol/L (ref 98–111)
Creatinine, Ser: 1.29 mg/dL — ABNORMAL HIGH (ref 0.61–1.24)
GFR, Estimated: 59 mL/min — ABNORMAL LOW (ref >60)
Glucose, Bld: 135 mg/dL — ABNORMAL HIGH (ref 70–99)
Potassium: 4.5 mmol/L (ref 3.5–5.1)
Sodium: 139 mmol/L (ref 135–145)
Total Bilirubin: 1 mg/dL (ref 0.3–1.2)
Total Protein: 6.4 g/dL — ABNORMAL LOW (ref 6.5–8.1)

## 2021-03-17 LAB — CBC WITH DIFFERENTIAL/PLATELET
Abs Immature Granulocytes: 0.25 10*3/uL — ABNORMAL HIGH (ref 0.00–0.07)
Basophils Absolute: 0 10*3/uL (ref 0.0–0.1)
Basophils Relative: 0 %
Eosinophils Absolute: 0 10*3/uL (ref 0.0–0.5)
Eosinophils Relative: 0 %
HCT: 48 % (ref 39.0–52.0)
Hemoglobin: 16.1 g/dL (ref 13.0–17.0)
Immature Granulocytes: 2 %
Lymphocytes Relative: 5 %
Lymphs Abs: 0.6 10*3/uL — ABNORMAL LOW (ref 0.7–4.0)
MCH: 33.4 pg (ref 26.0–34.0)
MCHC: 33.5 g/dL (ref 30.0–36.0)
MCV: 99.6 fL (ref 80.0–100.0)
Monocytes Absolute: 1.2 10*3/uL — ABNORMAL HIGH (ref 0.1–1.0)
Monocytes Relative: 9 %
Neutro Abs: 11.9 10*3/uL — ABNORMAL HIGH (ref 1.7–7.7)
Neutrophils Relative %: 84 %
Platelets: 292 10*3/uL (ref 150–400)
RBC: 4.82 MIL/uL (ref 4.22–5.81)
RDW: 12.6 % (ref 11.5–15.5)
WBC: 14 10*3/uL — ABNORMAL HIGH (ref 4.0–10.5)
nRBC: 0 % (ref 0.0–0.2)

## 2021-03-17 NOTE — Progress Notes (Signed)
.Transition of Care [facility]) - Emergency Department Mini Assessment   Patient Details  Name: BANDY HONAKER MRN: [id number] Date of Birth: 07-19-1950  [facility]) CM/SW Contact:    Illene Regulus, LCSW Phone Number: 03/17/2021, 2:59 PM   Clinical Narrative:  CSW spoke with pt's who stated falling off bed side commode. Pt stated he uses a wheel chair. CSW informed pt an order for PT has been put in by MD and explained the process if PT recommends SNF. Pt verbalized he is willing to follow any recommendation from PT eval    CSW spoke with pt's sone in law [name] [phone]) who is pt's POA,  . He stated he help pt on bedside commode due to pt complaining about tail bone hurting. He stated he left pt on commode to attend to his mother , when he came back pt was on the floor.  Pt's son in law stated pt told him he rolled off the commode to the floor. Pt's son-in-law stated he was unable to pick pt up due having shortness in breath per COVID dx. Pt's son in law then called EMS. Per pt's son in law there is someone in the home 24 hr to help with pt.  Per pt' son in law pt used a walker prior to hospitalization last week, but now pt mobilize is in a Psychologist, sport and exercise. Pt son in law stated HH has not started yet , he spoke with someone last week awaiting call back. Pt is currently waiting on PT eval. CSW continue to follow.  ED Mini Assessment: What brought you to the Emergency Department? : fall  Barriers to Discharge: Continued Medical Work up             Patient Contact and Communications        ,                 Admission diagnosis:  fall;covid positive Patient Active Problem List   Diagnosis Date Noted   Cellulitis of left lower extremity    Swelling of left foot    Effusion of left knee    Acute hypoxemic respiratory failure due to COVID-19 ([place]) 03/09/2021   AKI (acute kidney injury) ([place]) 03/09/2021   Shortness of breath 03/08/2021   HFrEF  (heart failure with reduced ejection fraction) ([place]) 03/08/2021   Pressure injury of skin 03/08/2020   [place] rash of groin 10/19/2018   Acute on chronic systolic heart failure ([place]) 10/17/2018   Acute on chronic systolic (congestive) heart failure ([place]) 10/17/2018   COPD exacerbation ([place]) 05/21/2018   Chronic pain syndrome 07/19/2015   Degenerative disc disease, lumbar 07/19/2015   Weakness 04/17/2015   Generalized weakness 04/17/2015   Polycythemia 04/17/2015   BPH (benign prostatic hyperplasia) 04/17/2015   Seizure disorder ([place]) 10/12/2014   TIA (transient ischemic attack) 08/23/2013   Other and unspecified angina pectoris 07/04/2013   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 [place] [address] Phone: [phone] Fax: [phone]  [facility] [address] [place] [address] Phone: [phone] Fax: [phone]

## 2021-03-17 NOTE — Evaluation (Signed)
Physical Therapy Evaluation Patient Details Name: Chris Payne MRN: [id number] DOB: Dec 21, 1950 Today's Date: 03/17/2021  History of Present Illness  Pt is 70 yo male admitted on 03/17/21 after fall at home and unable to get up.  Pt recently dx with COVID. Pt with hx including CAD status post MI and CABG, HTN, HLD, CVA, COPD  Clinical Impression   Pt admitted with above diagnosis. At baseline, pt can transfer to w/c independently and walk up to 10' with RW.  Since having COVID has had increased difficulty and fell earlier today on knees and unable to get up - presented to ED.  During PT evaluation, pt with skin tears on bil LE and significant edema and erythema in L knee and unable to WB on L LE due to pain.  Requiring mod A of 2 for transfers and unable to take steps.  Notified RN of L knee condition and she reports will notify MD.  Pt currently with functional limitations due to the deficits listed below (see PT Problem List). Pt will benefit from skilled PT to increase their independence and safety with mobility to allow discharge to the venue listed below.          Recommendations for follow up therapy are one component of a multi-disciplinary discharge planning process, led by the attending physician.  Recommendations may be updated based on patient status, additional functional criteria and insurance authorization.  Follow Up Recommendations Skilled nursing-short term rehab (<3 hours/day)    Assistance Recommended at Discharge Frequent or constant Supervision/Assistance  Functional Status Assessment Patient has had a recent decline in their functional status and demonstrates the ability to make significant improvements in function in a reasonable and predictable amount of time.  Equipment Recommendations  None recommended by PT    Recommendations for Other Services       Precautions / Restrictions Precautions Precautions: Fall Precaution Comments: COVID+, monitor SpO2       Mobility  Bed Mobility Overal bed mobility: Needs Assistance Bed Mobility: Supine to Sit;Sit to Supine     Supine to sit: Mod assist Sit to supine: Mod assist;+2 for physical assistance   General bed mobility comments: Increased time, cues for sequencing with assist for legs and trunk    Transfers Overall transfer level: Needs assistance Equipment used: Rolling walker (2 wheels) Transfers: Sit to/from Stand Sit to Stand: Mod assist;+2 physical assistance           General transfer comment: Required cues and mod A of 2 to rise; Unable to take steps;  pt in ED on stretcher with no chair to pivot to; L knee very painful and unable to weight bear.    Ambulation/Gait                  Stairs            Wheelchair Mobility    Modified Rankin (Stroke Patients Only)       Balance Overall balance assessment: Needs assistance Sitting-balance support: No upper extremity supported;Feet supported Sitting balance-Leahy Scale: Fair     Standing balance support: Bilateral upper extremity supported Standing balance-Leahy Scale: Poor Standing balance comment: RW and external assist, flexed posture                             Pertinent Vitals/Pain Pain Assessment: Faces Faces Pain Scale: Hurts even more Pain Location: L knee and back of leg Pain Descriptors / Indicators: Discomfort;Grimacing Pain  Intervention(s): Limited activity within patient's tolerance;Monitored during session    [place] expects to be discharged to:: Private residence Living Arrangements: Spouse/significant other Available Help at Discharge: Family;Available 24 hours/day Type of Home: House Home Access: Ramped entrance Entrance Stairs-Rails: None Entrance Stairs-Number of Steps: 1   Home Layout: Two level;Able to live on main level with bedroom/bathroom Home Equipment: Rolling Walker (2 wheels);Wheelchair - manual;Cane - single point      Prior  Function Prior Level of Function : Needs assist             Mobility Comments: Mod I with stand pivot transfers, w/c mobility at baseline. Able to walk up to ~10 ft with RW before starting to fall a couple days ago. ADLs Comments: Someone assists in cleaning his back and dressing his lower half., sponge bathes at baseline     Hand Dominance   Dominant Hand: Right    Extremity/Trunk Assessment   Upper Extremity Assessment Upper Extremity Assessment: LUE deficits/detail;RUE deficits/detail RUE Deficits / Details: ROM WFL; MMT 4/5 LUE Deficits / Details: ROM WFL; MMT 4/5    Lower Extremity Assessment Lower Extremity Assessment: LLE deficits/detail;RLE deficits/detail RLE Deficits / Details: ROM WFL; MMT 4/5 LLE Deficits / Details: ROM: ankle and hip WFL, knee lacking 10 ext (painful/edema)-60 flexion.  MMT: ankle 4/5, knee and hip 2/5.  L knee lower leg with bandage but weeping through, edema, and L knee very edematous and erythematic.  Painfult to straighten and unable to weight bear    Cervical / Trunk Assessment Cervical / Trunk Assessment: Kyphotic  Communication   Communication: No difficulties  Cognition Arousal/Alertness: Awake/alert Behavior During Therapy: WFL for tasks assessed/performed Overall Cognitive Status: Within Functional Limits for tasks assessed                                          General Comments General comments (skin integrity, edema, etc.): Pt on RA with sats 97-100%; did have some lightheadedness upon sitting with systolic BP dropping from 130's to 106 - symtpoms resolved after a few seconds  Pt with c/o pain in buttock once laying down and trying to offload.  He had underwear on but was able to roll enough for PT to see a wound - notified RN. Positioned pt in sidelying.    Exercises     Assessment/Plan    PT Assessment Patient needs continued PT services  PT Problem List Decreased strength;Decreased range of  motion;Decreased activity tolerance;Decreased balance;Decreased mobility;Cardiopulmonary status limiting activity;Decreased skin integrity;Decreased knowledge of use of DME;Pain       PT Treatment Interventions DME instruction;Gait training;Functional mobility training;Therapeutic activities;Therapeutic exercise;Balance training;Neuromuscular re-education;Patient/family education;Wheelchair mobility training    PT Goals (Current goals can be found in the Care Plan section)  Acute Rehab PT Goals Patient Stated Goal: to get back to his normal    Frequency Min 2X/week   Barriers to discharge        Co-evaluation               AM-PAC PT "6 Clicks" Mobility  Outcome Measure Help needed turning from your back to your side while in a flat bed without using bedrails?: A Lot Help needed moving from lying on your back to sitting on the side of a flat bed without using bedrails?: Total Help needed moving to and from a bed to a chair (including a wheelchair)?: Total  Help needed standing up from a chair using your arms (e.g., wheelchair or bedside chair)?: Total Help needed to walk in hospital room?: Total Help needed climbing 3-5 steps with a railing? : Total 6 Click Score: 7    End of Session Equipment Utilized During Treatment: Gait belt Activity Tolerance: Patient limited by pain (and in ED on stretcher) Patient left: with call bell/phone within reach;in bed Nurse Communication: Mobility status PT Visit Diagnosis: Unsteadiness on feet (R26.81);Other abnormalities of gait and mobility (R26.89);Muscle weakness (generalized) (M62.81);History of falling (Z91.81);Repeated falls (R29.6);Difficulty in walking, not elsewhere classified (R26.2);Pain Pain - Right/Left: Left Pain - part of body: Knee    Time: 5797-2820 PT Time Calculation (min) (ACUTE ONLY): 24 min   Charges:   PT Evaluation $PT Eval Moderate Complexity: 1 Mod PT Treatments $Therapeutic Activity: 8-22 mins         Abran Richard, PT Acute Rehab Services Pager [phone] [facility] [phone]   Karlton Lemon 03/17/2021, 5:05 PM

## 2021-03-17 NOTE — ED Triage Notes (Addendum)
Pt BIB EMS, pt coming from home, called in for lift assist. Pt tried to use bedside commode and lost balance. Wife in room. No head/back injury. Denies pain, neuro intact. Collection of fluid pocket L shin tore during fall. Covid positive, symptoms started Nov 13. Pt reported not taken any meds today. Reported no blood thinner. Hx of cardiac problems and DM, CBG 235 but doesn't check blood sugars. Per EMS pt has several skin tears on arms.

## 2021-03-17 NOTE — ED Notes (Signed)
Wound care preformed via bedside at this time. All skin tears and open areas treated per MD order, well tolerated.

## 2021-03-17 NOTE — ED Provider Notes (Addendum)
[facility] DEPT Provider Note   CSN: [id number] Arrival date & time: 03/17/21  0915     History Chief Complaint  Patient presents with   Chris Payne is a 70 y.o. male.  HPI  70 year old male with past medical history of CAD status post MI and CABG, HTN, HLD, CVA, COPD presents to the emergency department after a fall.  Patient states that he was diagnosed COVID-positive about a week ago on 11/13.  He has been having intermittently productive cough since then.  He has been experiencing worsening weakness and this morning he fell onto his knees while trying to use the bedside commode.  He states he lost balance, lowered himself down onto both of his knees.  Did not hit his head, there was no loss of consciousness.  He denies any head or neck/back pain.  He sustained skin tears to the knees and arms.  Patient had a large fluid-filled blister on the left shin that broke during the fall as well.  Patient states that his cough is becoming increasingly productive associated with fatigue and decreased appetite.  He states that he feels extremely weak and unsafe at home.  Past Medical History:  Diagnosis Date   AAA (abdominal aortic aneurysm)    s/p repair 6/11   CAD (coronary artery disease)    s/p CABG 2/12:  LIMA to LAD, SVG to diagonal-1, SVG to ramus intermedius,, SVG to AM (Dr. Roxan Hockey) ;  b.  Myoview 10/13:  inf infarct with very mild peri-infarct ischemia, EF 49%, inf HK; c  He has severe three-vessel native coronary artery disease. Catheterization March 2015 as above   CAP (community acquired pneumonia) 02/23/2014   Carotid stenosis    a.  dopplers 7/61: LICA 95-09%;  b. Carotid dopplers 3/14:  R 0-39%, L 60-79%, repeat 6 mos   CHF (congestive heart failure) (HCC)    COPD (chronic obstructive pulmonary disease) (HCC)    GERD (gastroesophageal reflux disease)    Headache(784.0)    Hyperlipidemia     Hypertension    Hypoxia 02/23/2014   Inguinal hernia    Myocardial infarction [facility])    PCI x3   PAD (peripheral artery disease) ([place])    ABIs 4/12:  R 0.88, L 0.92; R SFA 40%, L CFA 50%, L SFA 50-60%   Pneumonia    hx   Stroke [facility])    h/o pontine CVA   Thyroid nodule    incidental finding on carotid doppler 3/14 => thyroid U/S ordered    Patient Active Problem List   Diagnosis Date Noted   Cellulitis of left lower extremity    Swelling of left foot    Effusion of left knee    Acute hypoxemic respiratory failure due to COVID-19 ([place]) 03/09/2021   AKI (acute kidney injury) ([place]) 03/09/2021   Shortness of breath 03/08/2021   HFrEF (heart failure with reduced ejection fraction) ([place]) 03/08/2021   Pressure injury of skin 03/08/2020   Candida rash of groin 10/19/2018   Acute on chronic systolic heart failure ([place]) 10/17/2018   Acute on chronic systolic (congestive) heart failure ([place]) 10/17/2018   COPD exacerbation ([place]) 05/21/2018   Chronic pain syndrome 07/19/2015   Degenerative disc disease, lumbar 07/19/2015   Weakness 04/17/2015   Generalized weakness 04/17/2015   Polycythemia 04/17/2015   BPH (benign prostatic hyperplasia) 04/17/2015   Seizure disorder ([place]) 10/12/2014   TIA (transient ischemic  attack) 08/23/2013   Other and unspecified angina pectoris 07/04/2013   Nontoxic multinodular goiter 10/07/2012   Unstable angina ([place]) 06/19/2012   Carotid stenosis    PAD (peripheral artery disease) (HCC)    Left inguinal hernia 05/30/2012   COPD (chronic obstructive pulmonary disease) ([place]) 08/24/2011   S/P AAA repair 08/24/2011   Lumbar disc disease    Hypertensive heart disease    Hyperlipidemia    CAD (coronary artery disease)    History of pontine CVA    GERD (gastroesophageal reflux disease)     Past Surgical History:  Procedure Laterality Date   ABDOMINAL AORTIC ANEURYSM REPAIR  10-03-2009   CORONARY ARTERY BYPASS GRAFT  06/24/2010   LIMA to the LAD, SVG to first  diagonal, SVG to ramus intermediate, SVG to acute marginal. EF 50%. 2/12   EXPLORATION POST OPERATIVE OPEN HEART     EXPLORATORY LAPAROTOMY  09/2009   ligation of lumbar arteries   EXTERNAL EAR SURGERY Left    laceration child   HIP SURGERY  40 years ago   left hip bone removal and pinning   INGUINAL HERNIA REPAIR Left 07/21/2012   Procedure: HERNIA REPAIR INGUINAL ADULT;  Surgeon: Merrie Roof, MD;  Location: [facility];  Service: General;  Laterality: Left;   INSERTION OF MESH Left 07/21/2012   Procedure: INSERTION OF MESH;  Surgeon: Merrie Roof, MD;  Location: [facility];  Service: General;  Laterality: Left;   LEFT HEART CATHETERIZATION WITH CORONARY/GRAFT ANGIOGRAM  06/21/2012   Procedure: LEFT HEART CATHETERIZATION WITH Beatrix Fetters;  Surgeon: Minus Breeding, MD;  [facility] LAB;  Service: Cardiovascular;;   LEFT HEART CATHETERIZATION WITH CORONARY/GRAFT ANGIOGRAM N/A 07/04/2013   Procedure: LEFT HEART CATHETERIZATION WITH Beatrix Fetters;  Surgeon: Sinclair Grooms, MD;  Location[facility] C[facility] LAB;  Service: Cardiovascular;  Laterality: N/A;   NM MYOCAR PERF WALL MOTION  12/02/2010   inferoapical defect is fixed c/w prior infarction/scar, there is minimal borderzone ischemia.       Family History  Problem Relation Age of Onset   Alcohol abuse Father    Cancer Sister 41       unknown primary   Diabetes Mother    Hypertension Mother    Cancer Sister 41       kidney cancer    Social History   Tobacco Use   Smoking status: Former    Packs/day: 1.00    Years: 30.00    Pack years: 30.00    Types: Cigarettes    Quit date: 01/25/2010    Years since quitting: 11.1   Smokeless tobacco: Never   Tobacco comments:    "havent smoked in 7 years"  Vaping Use   Vaping Use: Never used  Substance Use Topics   Alcohol use: No    Comment: former drinker - Quit 2011.   Drug use: No    Home Medications Prior to Admission medications   Medication Sig Start Date End  Date Taking? Authorizing Provider  albuterol (VENTOLIN HFA) 108 (90 Base) MCG/ACT inhaler Inhale 2 puffs into the lungs every 6 (six) hours as needed for wheezing or shortness of breath. 03/08/20   [place] Donnamarie Poag, DO  aspirin EC 81 MG EC tablet Take 1 tablet (81 mg total) by mouth daily. 05/24/18   Geradine Girt, DO  budesonide-formoterol (SYMBICORT) 160-4.5 MCG/ACT inhaler Inhale 2 puffs into the lungs 2 (two) times daily. 03/08/20   [place] Eric J, DO  dexamethasone (DECADRON) 6  MG tablet Take 1 tablet (6 mg total) by mouth daily for 5 days. 03/14/21 03/19/21  Rehman, Areeg N, DO  furosemide (LASIX) 20 MG tablet Take 1 tablet (20 mg total) by mouth daily. 02/10/20   Deno Etienne, DO  metoprolol tartrate (LOPRESSOR) 25 MG tablet Take 1 tablet (25 mg total) by mouth 2 (two) times daily. 03/08/20 03/08/21  [place] Eric J, DO  nystatin (MYCOSTATIN/NYSTOP) powder Apply topically 3 (three) times daily. Patient not taking: Reported on 03/08/2021 10/19/18   Nigel Mormon, MD  pantoprazole (PROTONIX) 40 MG tablet Take 40 mg by mouth daily. 02/19/21   [provider]  rosuvastatin (CRESTOR) 40 MG tablet Take 1 tablet (40 mg total) by mouth daily. 03/08/20 03/08/21  [place] Eric J, DO  tamsulosin (FLOMAX) 0.4 MG CAPS capsule Take 0.4 mg by mouth daily. 12/12/19   [provider]  tiotropium (SPIRIVA HANDIHALER) 18 MCG inhalation capsule Place 1 capsule (18 mcg total) into inhaler and inhale daily. 03/08/20 03/08/21  [place] Eric J, DO  triamcinolone cream (KENALOG) 0.1 % Apply 1 application topically 2 (two) times daily. 09/04/20   Varney Biles, MD    Allergies    Patient has no known allergies.  Review of Systems   Review of Systems  Constitutional:  Positive for appetite change and fatigue. Negative for chills and fever.  HENT:  Negative for congestion.   Eyes:  Negative for visual disturbance.  Respiratory:  Positive for cough. Negative for shortness of breath.    Cardiovascular:  Negative for chest pain and palpitations.  Gastrointestinal:  Negative for abdominal pain, diarrhea and vomiting.  Genitourinary:  Negative for dysuria.  Musculoskeletal:  Negative for back pain and neck pain.  Skin:  Negative for rash.       Multiple skin tears, left shin, b/l knee, b/l forearms  Neurological:  Negative for headaches.   Physical Exam Updated Vital Signs BP 114/86 (BP Location: Right Arm)   Pulse 81   Temp 98 F (36.7 C) (Oral)   Resp 16   Ht 5\' 7"  (1.702 m)   Wt 72 kg   SpO2 96%   BMI 24.86 kg/m   Physical Exam Vitals and nursing note reviewed.  Constitutional:      General: He is not in acute distress.    Appearance: Normal appearance. He is ill-appearing. He is not toxic-appearing.  HENT:     Head: Normocephalic.     Mouth/Throat:     Mouth: Mucous membranes are dry.  Eyes:     Pupils: Pupils are equal, round, and reactive to light.  Cardiovascular:     Rate and Rhythm: Normal rate.  Pulmonary:     Effort: Pulmonary effort is normal. No respiratory distress.  Abdominal:     Palpations: Abdomen is soft.     Tenderness: There is no abdominal tenderness.  Musculoskeletal:        General: No swelling or deformity.     Cervical back: No rigidity or tenderness.     Comments: No back pain, knees are nontender and non swollen  Skin:    General: Skin is warm.     Comments: Multiple skin tears, largest is the left shin, linear on the anterior area, bilateral small circular skin tears over the knees, small skin tears on bilateral forearms, no active bleeding  Neurological:     Mental Status: He is alert and oriented to person, place, and time. Mental status is at baseline.  Psychiatric:  Mood and Affect: Mood normal.    ED Results / Procedures / Treatments   Labs (all labs ordered are listed, but only abnormal results are displayed) Labs Reviewed  CBC WITH DIFFERENTIAL/PLATELET  COMPREHENSIVE METABOLIC PANEL  URINALYSIS,  ROUTINE W REFLEX MICROSCOPIC    EKG None  Radiology DG Chest Port 1 View  Result Date: 03/17/2021 CLINICAL DATA:  Cough, recent fall. Unable to remove brace from patient's back. EXAM: PORTABLE CHEST 1 VIEW COMPARISON:  Chest radiograph dated March 07, 2021 FINDINGS: The heart is normal in size. Evidence of prior coronary artery bypass grafting. Lungs are clear without evidence of focal consolidation. No pleural effusion or pneumothorax. No acute osseous abnormality. IMPRESSION: No active disease. Electronically Signed   By: Keane Police D.O.   On: 03/17/2021 10:52    Procedures Procedures   Medications Ordered in ED Medications - No data to display  ED Course  I have reviewed the triage vital signs and the nursing notes.  Pertinent labs & imaging results that were available during my care of the patient were reviewed by me and considered in my medical decision making (see chart for details).    MDM Rules/Calculators/A&P                           70 year old male who currently lives at home presents emergency department with worsening weakness.  Recently diagnosed with COVID 7 days ago, has been having decline since then it.  He has a cough at baseline that he feels has become more productive.  Denies any fever at home.  While trying to transfer himself to bedside commode he fell down onto his bilateral knees, sustained multiple skin tears.  No head injury, loss of consciousness, neck/back pain.  Patient has multiple skin tears that will be cleaned and dressed.  No other swelling/deformity or findings of traumatic injury.  Chest x-ray shows no pneumonia.  Blood work is reassuring, he is a mild leukocytosis of 14 but is currently on Decadron, most likely causing this shift.  Otherwise he has baseline kidney dysfunction.  Patient states that he is too weak and feels unsafe going home.  Patient is chronically ill-appearing and in his current state does not appear well enough to care for  himself at home.  Plan for PT evaluation and social work/transition of care evaluation.  Final Clinical Impression(s) / ED Diagnoses Final diagnoses:  None    Rx / DC Orders ED Discharge Orders     None        Lorelle Gibbs, DO 03/17/21 1445    Chris Payne, Chris Critchley, DO 03/17/21 1513

## 2021-03-18 DIAGNOSIS — N179 Acute kidney failure, unspecified: Secondary | ICD-10-CM | POA: Diagnosis not present

## 2021-03-18 DIAGNOSIS — R5381 Other malaise: Secondary | ICD-10-CM | POA: Diagnosis not present

## 2021-03-18 DIAGNOSIS — S81011A Laceration without foreign body, right knee, initial encounter: Secondary | ICD-10-CM | POA: Diagnosis not present

## 2021-03-18 DIAGNOSIS — L89150 Pressure ulcer of sacral region, unstageable: Secondary | ICD-10-CM | POA: Diagnosis not present

## 2021-03-18 DIAGNOSIS — Z743 Need for continuous supervision: Secondary | ICD-10-CM | POA: Diagnosis not present

## 2021-03-18 DIAGNOSIS — L89620 Pressure ulcer of left heel, unstageable: Secondary | ICD-10-CM | POA: Diagnosis not present

## 2021-03-18 DIAGNOSIS — E860 Dehydration: Secondary | ICD-10-CM | POA: Diagnosis not present

## 2021-03-18 DIAGNOSIS — I2510 Atherosclerotic heart disease of native coronary artery without angina pectoris: Secondary | ICD-10-CM | POA: Diagnosis not present

## 2021-03-18 DIAGNOSIS — I5022 Chronic systolic (congestive) heart failure: Secondary | ICD-10-CM | POA: Diagnosis not present

## 2021-03-18 DIAGNOSIS — D692 Other nonthrombocytopenic purpura: Secondary | ICD-10-CM | POA: Diagnosis not present

## 2021-03-18 DIAGNOSIS — S81822A Laceration with foreign body, left lower leg, initial encounter: Secondary | ICD-10-CM | POA: Diagnosis not present

## 2021-03-18 DIAGNOSIS — E875 Hyperkalemia: Secondary | ICD-10-CM | POA: Diagnosis not present

## 2021-03-18 DIAGNOSIS — Z8616 Personal history of COVID-19: Secondary | ICD-10-CM | POA: Diagnosis not present

## 2021-03-18 DIAGNOSIS — I69398 Other sequelae of cerebral infarction: Secondary | ICD-10-CM | POA: Diagnosis not present

## 2021-03-18 DIAGNOSIS — E785 Hyperlipidemia, unspecified: Secondary | ICD-10-CM | POA: Diagnosis not present

## 2021-03-18 DIAGNOSIS — E871 Hypo-osmolality and hyponatremia: Secondary | ICD-10-CM | POA: Diagnosis not present

## 2021-03-18 DIAGNOSIS — N1330 Unspecified hydronephrosis: Secondary | ICD-10-CM | POA: Diagnosis not present

## 2021-03-18 DIAGNOSIS — T380X5A Adverse effect of glucocorticoids and synthetic analogues, initial encounter: Secondary | ICD-10-CM | POA: Diagnosis not present

## 2021-03-18 DIAGNOSIS — Z79899 Other long term (current) drug therapy: Secondary | ICD-10-CM | POA: Diagnosis not present

## 2021-03-18 DIAGNOSIS — I5023 Acute on chronic systolic (congestive) heart failure: Secondary | ICD-10-CM | POA: Diagnosis not present

## 2021-03-18 DIAGNOSIS — I6522 Occlusion and stenosis of left carotid artery: Secondary | ICD-10-CM | POA: Diagnosis not present

## 2021-03-18 DIAGNOSIS — I69828 Other speech and language deficits following other cerebrovascular disease: Secondary | ICD-10-CM | POA: Diagnosis not present

## 2021-03-18 DIAGNOSIS — L89152 Pressure ulcer of sacral region, stage 2: Secondary | ICD-10-CM | POA: Diagnosis not present

## 2021-03-18 DIAGNOSIS — U071 COVID-19: Secondary | ICD-10-CM | POA: Diagnosis not present

## 2021-03-18 DIAGNOSIS — Z87891 Personal history of nicotine dependence: Secondary | ICD-10-CM | POA: Diagnosis not present

## 2021-03-18 DIAGNOSIS — Z7982 Long term (current) use of aspirin: Secondary | ICD-10-CM | POA: Diagnosis not present

## 2021-03-18 DIAGNOSIS — S51811A Laceration without foreign body of right forearm, initial encounter: Secondary | ICD-10-CM | POA: Diagnosis not present

## 2021-03-18 DIAGNOSIS — G894 Chronic pain syndrome: Secondary | ICD-10-CM | POA: Diagnosis not present

## 2021-03-18 DIAGNOSIS — I959 Hypotension, unspecified: Secondary | ICD-10-CM | POA: Diagnosis not present

## 2021-03-18 DIAGNOSIS — E1151 Type 2 diabetes mellitus with diabetic peripheral angiopathy without gangrene: Secondary | ICD-10-CM | POA: Diagnosis not present

## 2021-03-18 DIAGNOSIS — Z9181 History of falling: Secondary | ICD-10-CM | POA: Diagnosis not present

## 2021-03-18 DIAGNOSIS — R739 Hyperglycemia, unspecified: Secondary | ICD-10-CM | POA: Diagnosis not present

## 2021-03-18 DIAGNOSIS — M6281 Muscle weakness (generalized): Secondary | ICD-10-CM | POA: Diagnosis not present

## 2021-03-18 DIAGNOSIS — R2681 Unsteadiness on feet: Secondary | ICD-10-CM | POA: Diagnosis not present

## 2021-03-18 DIAGNOSIS — S81012A Laceration without foreign body, left knee, initial encounter: Secondary | ICD-10-CM | POA: Diagnosis not present

## 2021-03-18 DIAGNOSIS — Y92129 Unspecified place in nursing home as the place of occurrence of the external cause: Secondary | ICD-10-CM | POA: Diagnosis not present

## 2021-03-18 DIAGNOSIS — N401 Enlarged prostate with lower urinary tract symptoms: Secondary | ICD-10-CM | POA: Diagnosis present

## 2021-03-18 DIAGNOSIS — L03116 Cellulitis of left lower limb: Secondary | ICD-10-CM | POA: Diagnosis not present

## 2021-03-18 DIAGNOSIS — L0390 Cellulitis, unspecified: Secondary | ICD-10-CM | POA: Diagnosis not present

## 2021-03-18 DIAGNOSIS — R338 Other retention of urine: Secondary | ICD-10-CM | POA: Diagnosis not present

## 2021-03-18 DIAGNOSIS — E1165 Type 2 diabetes mellitus with hyperglycemia: Secondary | ICD-10-CM | POA: Diagnosis not present

## 2021-03-18 DIAGNOSIS — R059 Cough, unspecified: Secondary | ICD-10-CM | POA: Diagnosis present

## 2021-03-18 DIAGNOSIS — W19XXXA Unspecified fall, initial encounter: Secondary | ICD-10-CM | POA: Diagnosis present

## 2021-03-18 DIAGNOSIS — S81812A Laceration without foreign body, left lower leg, initial encounter: Secondary | ICD-10-CM | POA: Diagnosis not present

## 2021-03-18 DIAGNOSIS — I252 Old myocardial infarction: Secondary | ICD-10-CM | POA: Diagnosis not present

## 2021-03-18 DIAGNOSIS — J449 Chronic obstructive pulmonary disease, unspecified: Secondary | ICD-10-CM | POA: Diagnosis not present

## 2021-03-18 DIAGNOSIS — S51812A Laceration without foreign body of left forearm, initial encounter: Secondary | ICD-10-CM | POA: Diagnosis not present

## 2021-03-18 DIAGNOSIS — I110 Hypertensive heart disease with heart failure: Secondary | ICD-10-CM | POA: Diagnosis not present

## 2021-03-18 DIAGNOSIS — I10 Essential (primary) hypertension: Secondary | ICD-10-CM | POA: Diagnosis not present

## 2021-03-18 DIAGNOSIS — Z951 Presence of aortocoronary bypass graft: Secondary | ICD-10-CM | POA: Diagnosis not present

## 2021-03-18 DIAGNOSIS — R531 Weakness: Secondary | ICD-10-CM | POA: Diagnosis not present

## 2021-03-18 DIAGNOSIS — Z7401 Bed confinement status: Secondary | ICD-10-CM | POA: Diagnosis not present

## 2021-03-18 LAB — URINALYSIS, ROUTINE W REFLEX MICROSCOPIC
Bacteria, UA: NONE SEEN
Bilirubin Urine: NEGATIVE
Glucose, UA: 150 mg/dL — AB
Hgb urine dipstick: NEGATIVE
Ketones, ur: NEGATIVE mg/dL
Leukocytes,Ua: NEGATIVE
Nitrite: NEGATIVE
Protein, ur: 100 mg/dL — AB
Specific Gravity, Urine: 1.024 (ref 1.005–1.030)
pH: 5 (ref 5.0–8.0)

## 2021-03-18 MED ORDER — PANTOPRAZOLE SODIUM 40 MG PO TBEC
40.0000 mg | DELAYED_RELEASE_TABLET | Freq: Every day | ORAL | Status: DC
  Administered 2021-03-18: 40 mg via ORAL
  Filled 2021-03-18: qty 1

## 2021-03-18 MED ORDER — ALBUTEROL SULFATE HFA 108 (90 BASE) MCG/ACT IN AERS: 2.0000 | INHALATION_SPRAY | Freq: Four times a day (QID) | RESPIRATORY_TRACT | Status: DC | PRN

## 2021-03-18 MED ORDER — ROSUVASTATIN CALCIUM 20 MG PO TABS
40.0000 mg | ORAL_TABLET | Freq: Every day | ORAL | Status: DC
  Administered 2021-03-18: 40 mg via ORAL
  Filled 2021-03-18: qty 2

## 2021-03-18 MED ORDER — ACETAMINOPHEN 325 MG PO TABS
650.0000 mg | ORAL_TABLET | ORAL | Status: DC | PRN
  Administered 2021-03-18: 650 mg via ORAL
  Filled 2021-03-18: qty 2

## 2021-03-18 MED ORDER — DEXAMETHASONE 4 MG PO TABS
6.0000 mg | ORAL_TABLET | Freq: Every day | ORAL | Status: DC
  Administered 2021-03-18: 6 mg via ORAL

## 2021-03-18 MED ORDER — MOMETASONE FURO-FORMOTEROL FUM 200-5 MCG/ACT IN AERO
2.0000 | INHALATION_SPRAY | Freq: Two times a day (BID) | RESPIRATORY_TRACT | Status: DC
  Filled 2021-03-18: qty 8.8

## 2021-03-18 MED ORDER — METOPROLOL TARTRATE 25 MG PO TABS
25.0000 mg | ORAL_TABLET | Freq: Two times a day (BID) | ORAL | Status: DC
  Administered 2021-03-18: 25 mg via ORAL
  Filled 2021-03-18: qty 1

## 2021-03-18 MED ORDER — DEXAMETHASONE 4 MG PO TABS
6.0000 mg | ORAL_TABLET | ORAL | Status: DC
  Filled 2021-03-18: qty 1

## 2021-03-18 MED ORDER — ASPIRIN EC 81 MG PO TBEC
81.0000 mg | DELAYED_RELEASE_TABLET | Freq: Every day | ORAL | Status: DC
  Administered 2021-03-18: 81 mg via ORAL
  Filled 2021-03-18: qty 1

## 2021-03-18 MED ORDER — GABAPENTIN 300 MG PO CAPS
300.0000 mg | ORAL_CAPSULE | Freq: Two times a day (BID) | ORAL | Status: DC
  Administered 2021-03-18: 300 mg via ORAL
  Filled 2021-03-18: qty 1

## 2021-03-18 NOTE — Progress Notes (Signed)
[facility]tar called  Call report [phone]  [name].Taniqua Issa, MSW, [place]  Transitions of Care Clinical Social Worker I Direct Dial: [phone]  Fax: [phone] [email or web address]

## 2021-03-18 NOTE — Progress Notes (Signed)
CSW spoke with pt about PT recommendations, pt agreed with recommendation and  stated "im willing to go if it will help" .   CSW spoke with l[place] powers pt's son-in -law and informed him of conversation CSW had with pt. Pt's son in law who stated he is on the way to the hospital to speak with pt, as the patient ask him yesterday night to pick him up.    10:50am  CSW attempted to contact son- in law to inquire if he will be picking pt up, no response left VM   Faye Strohman M.Aaylah Pokorny, MSW, [place]  Transitions of Care Clinical Social Worker I Direct Dial: [phone]  Fax: [phone] [email or web address]

## 2021-03-18 NOTE — Progress Notes (Signed)
CSW spoke with pt and pt's son- in - law Chris Payne at bedside, mr Payne stated he does agree with pt and wants pt placed. CSW offered choice family chose [facility]. CSW spoke with [place] from [facility] to confirm bed.CSW to submit auth.   1:21pm Auth approved. CSW informed pt and family of d/c to [facility] today.   [name].[name], MSW, [place]  Transitions of Care Clinical Social Worker I Direct Dial: [phone]  Fax: [phone] [email or web address]

## 2021-03-18 NOTE — NC FL2 (Signed)
[place] LEVEL OF CARE SCREENING TOOL     IDENTIFICATION  Patient Name: Chris Payne Birthdate: 12-29-1950 Sex: male Admission Date (Current Location): 03/17/2021  [facility] and [place] Number:  Herbalist and Address:  [facility],  [address], [place]      Provider Number: [id number]  Attending Physician Name and Address:  Default, Provider, MD  Relative Name and Phone Number:  Powers,Luther son-in-law [phone]    Current Level of Care: Hospital Recommended Level of Care: [place] Prior Approval Number:    Date Approved/Denied:   PASRR Number: [id number]  Discharge Plan: SNF    Current Diagnoses: Patient Active Problem List   Diagnosis Date Noted   Cellulitis of left lower extremity    Swelling of left foot    Effusion of left knee    Acute hypoxemic respiratory failure due to COVID-19 [facility]) 03/09/2021   AKI (acute kidney injury) ([place]) 03/09/2021   Shortness of breath 03/08/2021   HFrEF (heart failure with reduced ejection fraction) ([place]) 03/08/2021   Pressure injury of skin 03/08/2020   Candida rash of groin 10/19/2018   Acute on chronic systolic heart failure ([place]) 10/17/2018   Acute on chronic systolic (congestive) heart failure ([place]) 10/17/2018   COPD exacerbation ([place]) 05/21/2018   Chronic pain syndrome 07/19/2015   Degenerative disc disease, lumbar 07/19/2015   Weakness 04/17/2015   Generalized weakness 04/17/2015   Polycythemia 04/17/2015   BPH (benign prostatic hyperplasia) 04/17/2015   Seizure disorder ([place]) 10/12/2014   TIA (transient ischemic attack) 08/23/2013   Other and unspecified angina pectoris 07/04/2013   Nontoxic multinodular goiter 10/07/2012   Unstable angina (HCC) 06/19/2012   Carotid stenosis    PAD (peripheral artery disease) (HCC)    Left inguinal hernia 05/30/2012   COPD (chronic obstructive pulmonary disease) ([place]) 08/24/2011   S/P AAA repair 08/24/2011    Lumbar disc disease    Hypertensive heart disease    Hyperlipidemia    CAD (coronary artery disease)    History of pontine CVA    GERD (gastroesophageal reflux disease)     Orientation RESPIRATION BLADDER Height & Weight     Self, Time, Situation, Place  Normal Continent Weight: 158 lb 11.7 oz (72 kg) Height:  5\' 7"  (170.2 cm)  BEHAVIORAL SYMPTOMS/MOOD NEUROLOGICAL BOWEL NUTRITION STATUS      Continent Diet  AMBULATORY STATUS COMMUNICATION OF NEEDS Skin   Extensive Assist Verbally Skin abrasions (Multiple skin tears, largest is the left shin, linear on the anterior area, bilateral small circular skin tears over the knees, small skin tears on bilateral forearms, no active bleeding)                       Personal Care Assistance Level of Assistance  Bathing, Feeding, Dressing Bathing Assistance: Limited assistance Feeding assistance: Independent Dressing Assistance: Limited assistance     Functional Limitations Info  Sight, Hearing, Speech Sight Info: Adequate Hearing Info: Adequate Speech Info: Adequate    SPECIAL CARE FACTORS FREQUENCY  PT (By licensed PT), OT (By licensed OT)     PT Frequency: 5 x a week OT Frequency: 5 x a week            Contractures Contractures Info: Not present    Additional Factors Info  Code Status, Allergies Code Status Info: full Allergies Info: No Known Allergies           Current Medications (03/18/2021):  This is  the current hospital active medication list Current Facility-Administered Medications  Medication Dose Route Frequency Provider Last Rate Last Admin   acetaminophen (TYLENOL) tablet 650 mg  650 mg Oral Q4H PRN Isla Pence, MD   650 mg at 03/18/21 0930   albuterol (VENTOLIN HFA) 108 (90 Base) MCG/ACT inhaler 2 puff  2 puff Inhalation Q6H PRN Isla Pence, MD       aspirin EC tablet 81 mg  81 mg Oral Daily Isla Pence, MD   81 mg at 03/18/21 0930   dexamethasone (DECADRON) tablet 6 mg  6 mg Oral Daily  Isla Pence, MD   6 mg at 03/18/21 3545   gabapentin (NEURONTIN) capsule 300 mg  300 mg Oral BID Isla Pence, MD   300 mg at 03/18/21 0930   metoprolol tartrate (LOPRESSOR) tablet 25 mg  25 mg Oral BID Isla Pence, MD   25 mg at 03/18/21 0930   mometasone-formoterol (DULERA) 200-5 MCG/ACT inhaler 2 puff  2 puff Inhalation BID Isla Pence, MD       pantoprazole (PROTONIX) EC tablet 40 mg  40 mg Oral Daily Isla Pence, MD   40 mg at 03/18/21 0930   rosuvastatin (CRESTOR) tablet 40 mg  40 mg Oral Daily Isla Pence, MD   40 mg at 03/18/21 6256   Current Outpatient Medications  Medication Sig Dispense Refill   albuterol (VENTOLIN HFA) 108 (90 Base) MCG/ACT inhaler Inhale 2 puffs into the lungs every 6 (six) hours as needed for wheezing or shortness of breath. 18 g 0   aspirin EC 81 MG EC tablet Take 1 tablet (81 mg total) by mouth daily.     budesonide-formoterol (SYMBICORT) 160-4.5 MCG/ACT inhaler Inhale 2 puffs into the lungs 2 (two) times daily. 1 each 2   dexamethasone (DECADRON) 6 MG tablet Take 1 tablet (6 mg total) by mouth daily for 5 days. 5 tablet 0   gabapentin (NEURONTIN) 300 MG capsule Take 300 mg by mouth 2 (two) times daily.     pantoprazole (PROTONIX) 40 MG tablet Take 40 mg by mouth daily.     rosuvastatin (CRESTOR) 40 MG tablet Take 1 tablet (40 mg total) by mouth daily. 90 tablet 0   triamcinolone cream (KENALOG) 0.1 % Apply 1 application topically 2 (two) times daily. (Patient taking differently: Apply 1 application topically 2 (two) times daily as needed (rash, itching).) 30 g 0   cephALEXin (KEFLEX) 500 MG capsule Take 500 mg by mouth 4 (four) times daily. Started on 03-13-21 x 2 days     furosemide (LASIX) 20 MG tablet Take 1 tablet (20 mg total) by mouth daily. (Patient not taking: Reported on 03/17/2021) 30 tablet 0   metoprolol tartrate (LOPRESSOR) 25 MG tablet Take 1 tablet (25 mg total) by mouth 2 (two) times daily. 180 tablet 0     Discharge  Medications: Please see discharge summary for a list of discharge medications.  Relevant Imaging Results:  Relevant Lab Results:   Additional Information [id number]  Illene Regulus, LCSW

## 2021-03-19 DIAGNOSIS — D692 Other nonthrombocytopenic purpura: Secondary | ICD-10-CM | POA: Diagnosis not present

## 2021-03-19 DIAGNOSIS — R5381 Other malaise: Secondary | ICD-10-CM | POA: Diagnosis not present

## 2021-03-19 DIAGNOSIS — I5022 Chronic systolic (congestive) heart failure: Secondary | ICD-10-CM | POA: Diagnosis not present

## 2021-03-19 DIAGNOSIS — J449 Chronic obstructive pulmonary disease, unspecified: Secondary | ICD-10-CM | POA: Diagnosis not present

## 2021-03-25 ENCOUNTER — Inpatient Hospital Stay (HOSPITAL_COMMUNITY)
Admission: EM | Admit: 2021-03-25 | Discharge: 2021-03-29 | DRG: 726 | Disposition: A | Discharge status: Skilled Nursing Facility | Payer: Medicare Other | Source: Skilled Nursing Facility | Attending: Internal Medicine | Admitting: Internal Medicine

## 2021-03-25 ENCOUNTER — Other Ambulatory Visit: Payer: Self-pay

## 2021-03-25 DIAGNOSIS — E871 Hypo-osmolality and hyponatremia: Secondary | ICD-10-CM | POA: Diagnosis not present

## 2021-03-25 DIAGNOSIS — J441 Chronic obstructive pulmonary disease with (acute) exacerbation: Secondary | ICD-10-CM | POA: Diagnosis not present

## 2021-03-25 DIAGNOSIS — N401 Enlarged prostate with lower urinary tract symptoms: Principal | ICD-10-CM | POA: Diagnosis present

## 2021-03-25 DIAGNOSIS — E875 Hyperkalemia: Secondary | ICD-10-CM | POA: Diagnosis not present

## 2021-03-25 DIAGNOSIS — I110 Hypertensive heart disease with heart failure: Secondary | ICD-10-CM | POA: Diagnosis present

## 2021-03-25 DIAGNOSIS — R140 Abdominal distension (gaseous): Secondary | ICD-10-CM | POA: Diagnosis not present

## 2021-03-25 DIAGNOSIS — I5022 Chronic systolic (congestive) heart failure: Secondary | ICD-10-CM | POA: Diagnosis present

## 2021-03-25 DIAGNOSIS — I255 Ischemic cardiomyopathy: Secondary | ICD-10-CM | POA: Diagnosis present

## 2021-03-25 DIAGNOSIS — E1165 Type 2 diabetes mellitus with hyperglycemia: Secondary | ICD-10-CM | POA: Diagnosis not present

## 2021-03-25 DIAGNOSIS — R5381 Other malaise: Secondary | ICD-10-CM | POA: Diagnosis not present

## 2021-03-25 DIAGNOSIS — L89152 Pressure ulcer of sacral region, stage 2: Secondary | ICD-10-CM | POA: Diagnosis not present

## 2021-03-25 DIAGNOSIS — G894 Chronic pain syndrome: Secondary | ICD-10-CM | POA: Diagnosis not present

## 2021-03-25 DIAGNOSIS — L89600 Pressure ulcer of unspecified heel, unstageable: Secondary | ICD-10-CM

## 2021-03-25 DIAGNOSIS — Z79899 Other long term (current) drug therapy: Secondary | ICD-10-CM

## 2021-03-25 DIAGNOSIS — Z7951 Long term (current) use of inhaled steroids: Secondary | ICD-10-CM

## 2021-03-25 DIAGNOSIS — N1330 Unspecified hydronephrosis: Secondary | ICD-10-CM | POA: Diagnosis not present

## 2021-03-25 DIAGNOSIS — Z8616 Personal history of COVID-19: Secondary | ICD-10-CM | POA: Diagnosis not present

## 2021-03-25 DIAGNOSIS — R338 Other retention of urine: Secondary | ICD-10-CM | POA: Diagnosis not present

## 2021-03-25 DIAGNOSIS — L89613 Pressure ulcer of right heel, stage 3: Secondary | ICD-10-CM | POA: Diagnosis not present

## 2021-03-25 DIAGNOSIS — K8020 Calculus of gallbladder without cholecystitis without obstruction: Secondary | ICD-10-CM | POA: Diagnosis not present

## 2021-03-25 DIAGNOSIS — E785 Hyperlipidemia, unspecified: Secondary | ICD-10-CM | POA: Diagnosis present

## 2021-03-25 DIAGNOSIS — L0390 Cellulitis, unspecified: Secondary | ICD-10-CM | POA: Diagnosis not present

## 2021-03-25 DIAGNOSIS — J449 Chronic obstructive pulmonary disease, unspecified: Secondary | ICD-10-CM | POA: Diagnosis present

## 2021-03-25 DIAGNOSIS — L03116 Cellulitis of left lower limb: Secondary | ICD-10-CM | POA: Diagnosis present

## 2021-03-25 DIAGNOSIS — R739 Hyperglycemia, unspecified: Secondary | ICD-10-CM | POA: Diagnosis not present

## 2021-03-25 DIAGNOSIS — R531 Weakness: Secondary | ICD-10-CM | POA: Diagnosis not present

## 2021-03-25 DIAGNOSIS — Y92129 Unspecified place in nursing home as the place of occurrence of the external cause: Secondary | ICD-10-CM

## 2021-03-25 DIAGNOSIS — U071 COVID-19: Secondary | ICD-10-CM | POA: Diagnosis not present

## 2021-03-25 DIAGNOSIS — R2681 Unsteadiness on feet: Secondary | ICD-10-CM | POA: Diagnosis not present

## 2021-03-25 DIAGNOSIS — S81812A Laceration without foreign body, left lower leg, initial encounter: Secondary | ICD-10-CM | POA: Diagnosis present

## 2021-03-25 DIAGNOSIS — Z743 Need for continuous supervision: Secondary | ICD-10-CM | POA: Diagnosis not present

## 2021-03-25 DIAGNOSIS — R059 Cough, unspecified: Secondary | ICD-10-CM

## 2021-03-25 DIAGNOSIS — L98429 Non-pressure chronic ulcer of back with unspecified severity: Secondary | ICD-10-CM

## 2021-03-25 DIAGNOSIS — Z8249 Family history of ischemic heart disease and other diseases of the circulatory system: Secondary | ICD-10-CM

## 2021-03-25 DIAGNOSIS — Z8673 Personal history of transient ischemic attack (TIA), and cerebral infarction without residual deficits: Secondary | ICD-10-CM

## 2021-03-25 DIAGNOSIS — Z811 Family history of alcohol abuse and dependence: Secondary | ICD-10-CM

## 2021-03-25 DIAGNOSIS — I69828 Other speech and language deficits following other cerebrovascular disease: Secondary | ICD-10-CM | POA: Diagnosis not present

## 2021-03-25 DIAGNOSIS — W19XXXA Unspecified fall, initial encounter: Secondary | ICD-10-CM | POA: Diagnosis present

## 2021-03-25 DIAGNOSIS — I2510 Atherosclerotic heart disease of native coronary artery without angina pectoris: Secondary | ICD-10-CM | POA: Diagnosis not present

## 2021-03-25 DIAGNOSIS — N179 Acute kidney failure, unspecified: Principal | ICD-10-CM

## 2021-03-25 DIAGNOSIS — T380X5A Adverse effect of glucocorticoids and synthetic analogues, initial encounter: Secondary | ICD-10-CM | POA: Diagnosis present

## 2021-03-25 DIAGNOSIS — I5020 Unspecified systolic (congestive) heart failure: Secondary | ICD-10-CM | POA: Diagnosis present

## 2021-03-25 DIAGNOSIS — I959 Hypotension, unspecified: Secondary | ICD-10-CM | POA: Diagnosis not present

## 2021-03-25 DIAGNOSIS — E1151 Type 2 diabetes mellitus with diabetic peripheral angiopathy without gangrene: Secondary | ICD-10-CM | POA: Diagnosis present

## 2021-03-25 DIAGNOSIS — I69398 Other sequelae of cerebral infarction: Secondary | ICD-10-CM | POA: Diagnosis not present

## 2021-03-25 DIAGNOSIS — E860 Dehydration: Secondary | ICD-10-CM | POA: Diagnosis not present

## 2021-03-25 DIAGNOSIS — Z7982 Long term (current) use of aspirin: Secondary | ICD-10-CM

## 2021-03-25 DIAGNOSIS — I739 Peripheral vascular disease, unspecified: Secondary | ICD-10-CM | POA: Diagnosis present

## 2021-03-25 DIAGNOSIS — Z833 Family history of diabetes mellitus: Secondary | ICD-10-CM

## 2021-03-25 DIAGNOSIS — M6281 Muscle weakness (generalized): Secondary | ICD-10-CM | POA: Diagnosis not present

## 2021-03-25 DIAGNOSIS — R339 Retention of urine, unspecified: Secondary | ICD-10-CM

## 2021-03-25 DIAGNOSIS — N3289 Other specified disorders of bladder: Secondary | ICD-10-CM | POA: Diagnosis not present

## 2021-03-25 DIAGNOSIS — Z951 Presence of aortocoronary bypass graft: Secondary | ICD-10-CM

## 2021-03-25 DIAGNOSIS — Z7401 Bed confinement status: Secondary | ICD-10-CM | POA: Diagnosis not present

## 2021-03-25 DIAGNOSIS — I6522 Occlusion and stenosis of left carotid artery: Secondary | ICD-10-CM | POA: Diagnosis not present

## 2021-03-25 DIAGNOSIS — Z9861 Coronary angioplasty status: Secondary | ICD-10-CM

## 2021-03-25 DIAGNOSIS — M5136 Other intervertebral disc degeneration, lumbar region: Secondary | ICD-10-CM | POA: Diagnosis present

## 2021-03-25 DIAGNOSIS — L89620 Pressure ulcer of left heel, unstageable: Secondary | ICD-10-CM | POA: Diagnosis present

## 2021-03-25 DIAGNOSIS — I252 Old myocardial infarction: Secondary | ICD-10-CM

## 2021-03-25 DIAGNOSIS — J9811 Atelectasis: Secondary | ICD-10-CM | POA: Diagnosis not present

## 2021-03-25 DIAGNOSIS — R262 Difficulty in walking, not elsewhere classified: Secondary | ICD-10-CM | POA: Diagnosis not present

## 2021-03-25 DIAGNOSIS — Z8051 Family history of malignant neoplasm of kidney: Secondary | ICD-10-CM

## 2021-03-25 DIAGNOSIS — L89150 Pressure ulcer of sacral region, unstageable: Secondary | ICD-10-CM | POA: Diagnosis not present

## 2021-03-25 DIAGNOSIS — D649 Anemia, unspecified: Secondary | ICD-10-CM | POA: Diagnosis not present

## 2021-03-25 DIAGNOSIS — K219 Gastro-esophageal reflux disease without esophagitis: Secondary | ICD-10-CM | POA: Diagnosis present

## 2021-03-25 DIAGNOSIS — I10 Essential (primary) hypertension: Secondary | ICD-10-CM | POA: Diagnosis not present

## 2021-03-25 DIAGNOSIS — L8990 Pressure ulcer of unspecified site, unspecified stage: Secondary | ICD-10-CM | POA: Diagnosis present

## 2021-03-25 DIAGNOSIS — R6889 Other general symptoms and signs: Secondary | ICD-10-CM | POA: Diagnosis not present

## 2021-03-25 DIAGNOSIS — R1312 Dysphagia, oropharyngeal phase: Secondary | ICD-10-CM | POA: Diagnosis not present

## 2021-03-25 DIAGNOSIS — N400 Enlarged prostate without lower urinary tract symptoms: Secondary | ICD-10-CM | POA: Diagnosis present

## 2021-03-25 DIAGNOSIS — Z87891 Personal history of nicotine dependence: Secondary | ICD-10-CM

## 2021-03-25 DIAGNOSIS — R2689 Other abnormalities of gait and mobility: Secondary | ICD-10-CM | POA: Diagnosis not present

## 2021-03-25 LAB — CBC
HCT: 42.2 % (ref 39.0–52.0)
Hemoglobin: 13.8 g/dL (ref 13.0–17.0)
MCH: 33.2 pg (ref 26.0–34.0)
MCHC: 32.7 g/dL (ref 30.0–36.0)
MCV: 101.4 fL — ABNORMAL HIGH (ref 80.0–100.0)
Platelets: 136 10*3/uL — ABNORMAL LOW (ref 150–400)
RBC: 4.16 MIL/uL — ABNORMAL LOW (ref 4.22–5.81)
RDW: 13.1 % (ref 11.5–15.5)
WBC: 11.3 10*3/uL — ABNORMAL HIGH (ref 4.0–10.5)
nRBC: 0 % (ref 0.0–0.2)

## 2021-03-25 LAB — BASIC METABOLIC PANEL
Anion gap: 11 (ref 5–15)
BUN: 115 mg/dL — ABNORMAL HIGH (ref 8–23)
CO2: 22 mmol/L (ref 22–32)
Calcium: 8 mg/dL — ABNORMAL LOW (ref 8.9–10.3)
Chloride: 94 mmol/L — ABNORMAL LOW (ref 98–111)
Creatinine, Ser: 4.35 mg/dL — ABNORMAL HIGH (ref 0.61–1.24)
GFR, Estimated: 14 mL/min — ABNORMAL LOW (ref >60)
Glucose, Bld: 523 mg/dL (ref 70–99)
Potassium: 5.2 mmol/L — ABNORMAL HIGH (ref 3.5–5.1)
Sodium: 127 mmol/L — ABNORMAL LOW (ref 135–145)

## 2021-03-25 LAB — CBG MONITORING, ED: Glucose-Capillary: 529 mg/dL (ref 70–99)

## 2021-03-25 NOTE — ED Triage Notes (Signed)
Pt here POV via PTAR from [facility] with complaints of Abnormal labs including high glucose of 503 PTAR reports 548. BUN 106.3  Pt alert to self at baseline.

## 2021-03-26 ENCOUNTER — Emergency Department (HOSPITAL_COMMUNITY): Payer: Medicare Other

## 2021-03-26 ENCOUNTER — Inpatient Hospital Stay (HOSPITAL_COMMUNITY): Payer: Medicare Other

## 2021-03-26 DIAGNOSIS — R059 Cough, unspecified: Secondary | ICD-10-CM | POA: Diagnosis present

## 2021-03-26 DIAGNOSIS — L89150 Pressure ulcer of sacral region, unstageable: Secondary | ICD-10-CM | POA: Diagnosis present

## 2021-03-26 DIAGNOSIS — T380X5A Adverse effect of glucocorticoids and synthetic analogues, initial encounter: Secondary | ICD-10-CM | POA: Diagnosis present

## 2021-03-26 DIAGNOSIS — R338 Other retention of urine: Secondary | ICD-10-CM | POA: Diagnosis present

## 2021-03-26 DIAGNOSIS — I2510 Atherosclerotic heart disease of native coronary artery without angina pectoris: Secondary | ICD-10-CM | POA: Diagnosis present

## 2021-03-26 DIAGNOSIS — Z8616 Personal history of COVID-19: Secondary | ICD-10-CM | POA: Diagnosis not present

## 2021-03-26 DIAGNOSIS — I5022 Chronic systolic (congestive) heart failure: Secondary | ICD-10-CM | POA: Diagnosis present

## 2021-03-26 DIAGNOSIS — G894 Chronic pain syndrome: Secondary | ICD-10-CM | POA: Diagnosis present

## 2021-03-26 DIAGNOSIS — L89620 Pressure ulcer of left heel, unstageable: Secondary | ICD-10-CM | POA: Diagnosis present

## 2021-03-26 DIAGNOSIS — I252 Old myocardial infarction: Secondary | ICD-10-CM | POA: Diagnosis not present

## 2021-03-26 DIAGNOSIS — J449 Chronic obstructive pulmonary disease, unspecified: Secondary | ICD-10-CM | POA: Diagnosis present

## 2021-03-26 DIAGNOSIS — R339 Retention of urine, unspecified: Secondary | ICD-10-CM

## 2021-03-26 DIAGNOSIS — Y92129 Unspecified place in nursing home as the place of occurrence of the external cause: Secondary | ICD-10-CM | POA: Diagnosis not present

## 2021-03-26 DIAGNOSIS — W19XXXA Unspecified fall, initial encounter: Secondary | ICD-10-CM | POA: Diagnosis present

## 2021-03-26 DIAGNOSIS — N179 Acute kidney failure, unspecified: Secondary | ICD-10-CM | POA: Diagnosis present

## 2021-03-26 DIAGNOSIS — E875 Hyperkalemia: Secondary | ICD-10-CM | POA: Diagnosis present

## 2021-03-26 DIAGNOSIS — I6522 Occlusion and stenosis of left carotid artery: Secondary | ICD-10-CM | POA: Diagnosis present

## 2021-03-26 DIAGNOSIS — R739 Hyperglycemia, unspecified: Secondary | ICD-10-CM | POA: Diagnosis present

## 2021-03-26 DIAGNOSIS — E785 Hyperlipidemia, unspecified: Secondary | ICD-10-CM | POA: Diagnosis present

## 2021-03-26 DIAGNOSIS — L03116 Cellulitis of left lower limb: Secondary | ICD-10-CM | POA: Diagnosis present

## 2021-03-26 DIAGNOSIS — I110 Hypertensive heart disease with heart failure: Secondary | ICD-10-CM | POA: Diagnosis present

## 2021-03-26 DIAGNOSIS — E1151 Type 2 diabetes mellitus with diabetic peripheral angiopathy without gangrene: Secondary | ICD-10-CM | POA: Diagnosis present

## 2021-03-26 DIAGNOSIS — N401 Enlarged prostate with lower urinary tract symptoms: Secondary | ICD-10-CM | POA: Diagnosis present

## 2021-03-26 DIAGNOSIS — I959 Hypotension, unspecified: Secondary | ICD-10-CM | POA: Diagnosis not present

## 2021-03-26 DIAGNOSIS — S81812A Laceration without foreign body, left lower leg, initial encounter: Secondary | ICD-10-CM | POA: Diagnosis present

## 2021-03-26 DIAGNOSIS — N1330 Unspecified hydronephrosis: Secondary | ICD-10-CM | POA: Diagnosis present

## 2021-03-26 DIAGNOSIS — E860 Dehydration: Secondary | ICD-10-CM | POA: Diagnosis not present

## 2021-03-26 DIAGNOSIS — E871 Hypo-osmolality and hyponatremia: Secondary | ICD-10-CM | POA: Diagnosis present

## 2021-03-26 LAB — BASIC METABOLIC PANEL
Anion gap: 9 (ref 5–15)
BUN: 109 mg/dL — ABNORMAL HIGH (ref 8–23)
CO2: 23 mmol/L (ref 22–32)
Calcium: 8 mg/dL — ABNORMAL LOW (ref 8.9–10.3)
Chloride: 99 mmol/L (ref 98–111)
Creatinine, Ser: 3.43 mg/dL — ABNORMAL HIGH (ref 0.61–1.24)
GFR, Estimated: 18 mL/min — ABNORMAL LOW (ref >60)
Glucose, Bld: 203 mg/dL — ABNORMAL HIGH (ref 70–99)
Potassium: 5.4 mmol/L — ABNORMAL HIGH (ref 3.5–5.1)
Sodium: 131 mmol/L — ABNORMAL LOW (ref 135–145)

## 2021-03-26 LAB — URINALYSIS, MICROSCOPIC (REFLEX)
Bacteria, UA: NONE SEEN
Squamous Epithelial / HPF: NONE SEEN (ref 0–5)

## 2021-03-26 LAB — URINALYSIS, ROUTINE W REFLEX MICROSCOPIC
Bilirubin Urine: NEGATIVE
Glucose, UA: >=500 mg/dL — AB
Ketones, ur: NEGATIVE mg/dL
Leukocytes,Ua: NEGATIVE
Nitrite: NEGATIVE
Protein, ur: NEGATIVE mg/dL
Specific Gravity, Urine: 1.01 (ref 1.005–1.030)
pH: 6 (ref 5.0–8.0)

## 2021-03-26 LAB — RESP PANEL BY RT-PCR (FLU A&B, COVID) ARPGX2
Influenza A by PCR: NEGATIVE
Influenza B by PCR: NEGATIVE
SARS Coronavirus 2 by RT PCR: POSITIVE — AB

## 2021-03-26 LAB — OSMOLALITY, URINE: Osmolality, Ur: 550 mOsm/kg (ref 300–900)

## 2021-03-26 LAB — CBG MONITORING, ED
Glucose-Capillary: 406 mg/dL — ABNORMAL HIGH (ref 70–99)
Glucose-Capillary: 361 mg/dL — ABNORMAL HIGH (ref 70–99)
Glucose-Capillary: 270 mg/dL — ABNORMAL HIGH (ref 70–99)
Glucose-Capillary: 159 mg/dL — ABNORMAL HIGH (ref 70–99)

## 2021-03-26 LAB — HEMOGLOBIN A1C
Hgb A1c MFr Bld: 8.2 % — ABNORMAL HIGH (ref 4.8–5.6)
Mean Plasma Glucose: 188.64 mg/dL

## 2021-03-26 LAB — OSMOLALITY: Osmolality: 321 mOsm/kg (ref 275–295)

## 2021-03-26 MED ORDER — ASPIRIN EC 81 MG PO TBEC
81.0000 mg | DELAYED_RELEASE_TABLET | Freq: Every day | ORAL | Status: DC
  Administered 2021-03-27 – 2021-03-28 (×2): 81 mg via ORAL
  Filled 2021-03-26 (×2): qty 1

## 2021-03-26 MED ORDER — INSULIN ASPART 100 UNIT/ML IV SOLN: 5.0000 [IU] | Freq: Once | INTRAVENOUS | Status: DC

## 2021-03-26 MED ORDER — ACETAMINOPHEN 325 MG PO TABS
650.0000 mg | ORAL_TABLET | Freq: Four times a day (QID) | ORAL | Status: DC | PRN
  Administered 2021-03-27: 650 mg via ORAL
  Filled 2021-03-26: qty 2

## 2021-03-26 MED ORDER — METOPROLOL TARTRATE 25 MG PO TABS: 25.0000 mg | ORAL_TABLET | Freq: Two times a day (BID) | ORAL | Status: DC

## 2021-03-26 MED ORDER — TRIAMCINOLONE ACETONIDE 0.1 % EX CREA: 1.0000 "application " | TOPICAL_CREAM | Freq: Two times a day (BID) | CUTANEOUS | Status: DC | PRN

## 2021-03-26 MED ORDER — VANCOMYCIN HCL 1000 MG/200ML IV SOLN
1000.0000 mg | Freq: Once | INTRAVENOUS | Status: AC
  Administered 2021-03-27: 1000 mg via INTRAVENOUS
  Filled 2021-03-26: qty 200

## 2021-03-26 MED ORDER — ACETAMINOPHEN 650 MG RE SUPP: 650.0000 mg | Freq: Four times a day (QID) | RECTAL | Status: DC | PRN

## 2021-03-26 MED ORDER — ROSUVASTATIN CALCIUM 20 MG PO TABS
40.0000 mg | ORAL_TABLET | Freq: Every day | ORAL | Status: DC
  Administered 2021-03-27 – 2021-03-28 (×2): 40 mg via ORAL
  Filled 2021-03-26 (×2): qty 2

## 2021-03-26 MED ORDER — ENOXAPARIN SODIUM 30 MG/0.3ML IJ SOSY
30.0000 mg | PREFILLED_SYRINGE | INTRAMUSCULAR | Status: DC
  Administered 2021-03-26 – 2021-03-28 (×3): 30 mg via SUBCUTANEOUS
  Filled 2021-03-26 (×3): qty 0.3

## 2021-03-26 MED ORDER — INSULIN ASPART 100 UNIT/ML IJ SOLN
0.0000 [IU] | Freq: Three times a day (TID) | INTRAMUSCULAR | Status: DC
  Administered 2021-03-26: 8 [IU] via SUBCUTANEOUS
  Administered 2021-03-27: 5 [IU] via SUBCUTANEOUS
  Administered 2021-03-27 – 2021-03-28 (×4): 3 [IU] via SUBCUTANEOUS
  Administered 2021-03-28: 2 [IU] via SUBCUTANEOUS

## 2021-03-26 MED ORDER — TAMSULOSIN HCL 0.4 MG PO CAPS
0.4000 mg | ORAL_CAPSULE | Freq: Every day | ORAL | Status: DC
  Administered 2021-03-26 – 2021-03-28 (×3): 0.4 mg via ORAL
  Filled 2021-03-26 (×3): qty 1

## 2021-03-26 MED ORDER — PANTOPRAZOLE SODIUM 40 MG PO TBEC
40.0000 mg | DELAYED_RELEASE_TABLET | Freq: Every day | ORAL | Status: DC
  Administered 2021-03-27 – 2021-03-28 (×2): 40 mg via ORAL
  Filled 2021-03-26 (×2): qty 1

## 2021-03-26 MED ORDER — FLUTICASONE FUROATE-VILANTEROL 200-25 MCG/ACT IN AEPB
1.0000 | INHALATION_SPRAY | Freq: Every day | RESPIRATORY_TRACT | Status: DC
  Administered 2021-03-27 – 2021-03-28 (×2): 1 via RESPIRATORY_TRACT
  Filled 2021-03-26: qty 28

## 2021-03-26 MED ORDER — INSULIN ASPART 100 UNIT/ML IJ SOLN
5.0000 [IU] | Freq: Once | INTRAMUSCULAR | Status: AC
  Administered 2021-03-26: 5 [IU] via SUBCUTANEOUS

## 2021-03-26 MED ORDER — ALBUTEROL SULFATE (2.5 MG/3ML) 0.083% IN NEBU: 2.5000 mg | INHALATION_SOLUTION | Freq: Four times a day (QID) | RESPIRATORY_TRACT | Status: DC | PRN

## 2021-03-26 MED ORDER — ACETAMINOPHEN 500 MG PO TABS
1000.0000 mg | ORAL_TABLET | Freq: Once | ORAL | Status: AC
  Administered 2021-03-26: 1000 mg via ORAL
  Filled 2021-03-26: qty 2

## 2021-03-26 NOTE — ED Notes (Signed)
Patient transported to CT 

## 2021-03-26 NOTE — ED Provider Notes (Signed)
[facility] EMERGENCY DEPARTMENT Provider Note   CSN: [id number] Arrival date & time: 03/25/21  7035     History No chief complaint on file.   Chris Payne is a 70 y.o. male.  Patient with complex medical history comes from [place] facility for significant abnormal labs.  Patient has a history of repaired aortic aneurysm, coronary artery disease, heart failure, heart attack, peripheral vascular disease, skin ulcers and stroke.  Patient denies known diabetes or insulin use.  Patient is felt general weakness recently.  Decreased urine output recently.  Patient unsure details as was sent over by facility.      Past Medical History:  Diagnosis Date   AAA (abdominal aortic aneurysm)    s/p repair 6/11   CAD (coronary artery disease)    s/p CABG 2/12:  LIMA to LAD, SVG to diagonal-1, SVG to ramus intermedius,, SVG to AM (Dr. Roxan Hockey) ;  b.  Myoview 10/13:  inf infarct with very mild peri-infarct ischemia, EF 49%, inf HK; c  He has severe three-vessel native coronary artery disease. Catheterization March 2015 as above   CAP (community acquired pneumonia) 02/23/2014   Carotid stenosis    a.  dopplers 0/09: LICA 38-18%;  b. Carotid dopplers 3/14:  R 0-39%, L 60-79%, repeat 6 mos   CHF (congestive heart failure) (HCC)    COPD (chronic obstructive pulmonary disease) (HCC)    GERD (gastroesophageal reflux disease)    Headache(784.0)    Hyperlipidemia    Hypertension    Hypoxia 02/23/2014   Inguinal hernia    Myocardial infarction [facility])    PCI x3   PAD (peripheral artery disease) ([place])    ABIs 4/12:  R 0.88, L 0.92; R SFA 40%, L CFA 50%, L SFA 50-60%   Pneumonia    hx   Stroke [facility])    h/o pontine CVA   Thyroid nodule    incidental finding on carotid doppler 3/14 => thyroid U/S ordered    Patient Active Problem List   Diagnosis Date Noted   Cellulitis of left lower extremity    Swelling of left foot    Effusion of left knee    Acute  hypoxemic respiratory failure due to COVID-19 ([place]) 03/09/2021   AKI (acute kidney injury) ([place]) 03/09/2021   Shortness of breath 03/08/2021   HFrEF (heart failure with reduced ejection fraction) ([place]) 03/08/2021   Pressure injury of skin 03/08/2020   Candida rash of groin 10/19/2018   Acute on chronic systolic heart failure ([place]) 10/17/2018   Acute on chronic systolic (congestive) heart failure ([place]) 10/17/2018   COPD exacerbation ([place]) 05/21/2018   Chronic pain syndrome 07/19/2015   Degenerative disc disease, lumbar 07/19/2015   Weakness 04/17/2015   Generalized weakness 04/17/2015   Polycythemia 04/17/2015   BPH (benign prostatic hyperplasia) 04/17/2015   Seizure disorder ([place]) 10/12/2014   TIA (transient ischemic attack) 08/23/2013   Other and unspecified angina pectoris 07/04/2013   Nontoxic multinodular goiter 10/07/2012   Unstable angina ([place]) 06/19/2012   Carotid stenosis    PAD (peripheral artery disease) ([place])    Left inguinal hernia 05/30/2012   COPD (chronic obstructive pulmonary disease) ([place]) 08/24/2011   S/P AAA repair 08/24/2011   Lumbar disc disease    Hypertensive heart disease    Hyperlipidemia    CAD (coronary artery disease)    History of pontine CVA    GERD (gastroesophageal reflux disease)     Past Surgical History:  Procedure Laterality Date  ABDOMINAL AORTIC ANEURYSM REPAIR  10-03-2009   CORONARY ARTERY BYPASS GRAFT  06/24/2010   LIMA to the LAD, SVG to first diagonal, SVG to ramus intermediate, SVG to acute marginal. EF 50%. 2/12   EXPLORATION POST OPERATIVE OPEN HEART     EXPLORATORY LAPAROTOMY  09/2009   ligation of lumbar arteries   EXTERNAL EAR SURGERY Left    laceration child   HIP SURGERY  40 years ago   left hip bone removal and pinning   INGUINAL HERNIA REPAIR Left 07/21/2012   Procedure: HERNIA REPAIR INGUINAL ADULT;  Surgeon: Merrie Roof, MD;  Location: [place];  Service: General;  Laterality: Left;   INSERTION OF MESH Left 07/21/2012    Procedure: INSERTION OF MESH;  Surgeon: Merrie Roof, MD;  Location: [facility];  Service: General;  Laterality: Left;   LEFT HEART CATHETERIZATION WITH CORONARY/GRAFT ANGIOGRAM  06/21/2012   Procedure: LEFT HEART CATHETERIZATION WITH Beatrix Fetters;  Surgeon: Minus Breeding, MD;  Location: [facility] C[facility] LAB;  Service: Cardiovascular;;   LEFT HEART CATHETERIZATION WITH CORONARY/GRAFT ANGIOGRAM N/A 07/04/2013   Procedure: LEFT HEART CATHETERIZATION WITH Beatrix Fetters;  Surgeon: Sinclair Grooms, MD;  Location: [facility] CATH LAB;  Service: Cardiovascular;  Laterality: N/A;   NM MYOCAR PERF WALL MOTION  12/02/2010   inferoapical defect is fixed c/w prior infarction/scar, there is minimal borderzone ischemia.       Family History  Problem Relation Age of Onset   Alcohol abuse Father    Cancer Sister 66       unknown primary   Diabetes Mother    Hypertension Mother    Cancer Sister 85       kidney cancer    Social History   Tobacco Use   Smoking status: Former    Packs/day: 1.00    Years: 30.00    Pack years: 30.00    Types: Cigarettes    Quit date: 01/25/2010    Years since quitting: 11.1   Smokeless tobacco: Never   Tobacco comments:    "havent smoked in 7 years"  Vaping Use   Vaping Use: Never used  Substance Use Topics   Alcohol use: No    Comment: former drinker - Quit 2011.   Drug use: No    Home Medications Prior to Admission medications   Medication Sig Start Date End Date Taking? Authorizing Provider  albuterol (VENTOLIN HFA) 108 (90 Base) MCG/ACT inhaler Inhale 2 puffs into the lungs every 6 (six) hours as needed for wheezing or shortness of breath. 03/08/20   [place] Donnamarie Poag, DO  aspirin EC 81 MG EC tablet Take 1 tablet (81 mg total) by mouth daily. 05/24/18   Geradine Girt, DO  budesonide-formoterol (SYMBICORT) 160-4.5 MCG/ACT inhaler Inhale 2 puffs into the lungs 2 (two) times daily. 03/08/20   [place] Eric J, DO  cephALEXin (KEFLEX) 500 MG capsule Take  500 mg by mouth 4 (four) times daily. Started on 03-13-21 x 2 days    [provider]  furosemide (LASIX) 20 MG tablet Take 1 tablet (20 mg total) by mouth daily. Patient not taking: Reported on 03/17/2021 02/10/20   Deno Etienne, DO  gabapentin (NEURONTIN) 300 MG capsule Take 300 mg by mouth 2 (two) times daily.    [provider]  metoprolol tartrate (LOPRESSOR) 25 MG tablet Take 1 tablet (25 mg total) by mouth 2 (two) times daily. 03/08/20 03/08/21  [place] Eric J, DO  pantoprazole (PROTONIX) 40 MG tablet Take 40  mg by mouth daily. 02/19/21   [provider]  rosuvastatin (CRESTOR) 40 MG tablet Take 1 tablet (40 mg total) by mouth daily. 03/08/20 03/17/21  [place] Donnamarie Poag, DO  triamcinolone cream (KENALOG) 0.1 % Apply 1 application topically 2 (two) times daily. Patient taking differently: Apply 1 application topically 2 (two) times daily as needed (rash, itching). 09/04/20   Varney Biles, MD    Allergies    Patient has no known allergies.  Review of Systems   Review of Systems  Constitutional:  Positive for fatigue. Negative for chills and fever.  HENT:  Negative for congestion.   Eyes:  Negative for visual disturbance.  Respiratory:  Negative for shortness of breath.   Cardiovascular:  Negative for chest pain.  Gastrointestinal:  Positive for abdominal pain. Negative for vomiting.  Genitourinary:  Positive for difficulty urinating. Negative for dysuria and flank pain.  Musculoskeletal:  Negative for back pain, neck pain and neck stiffness.  Skin:  Positive for rash.  Neurological:  Positive for weakness. Negative for light-headedness and headaches.   Physical Exam Updated Vital Signs BP 114/64   Pulse 70   Temp 98.2 F (36.8 C) (Oral)   Resp 16   SpO2 95%   Physical Exam Vitals and nursing note reviewed.  Constitutional:      General: He is not in acute distress.    Appearance: He is well-developed. He is ill-appearing.  HENT:     Head:  Normocephalic and atraumatic.     Mouth/Throat:     Mouth: Mucous membranes are dry.  Eyes:     General:        Right eye: No discharge.        Left eye: No discharge.     Conjunctiva/sclera: Conjunctivae normal.  Neck:     Trachea: No tracheal deviation.  Cardiovascular:     Rate and Rhythm: Normal rate.  Pulmonary:     Effort: Pulmonary effort is normal.     Breath sounds: Rales present.  Abdominal:     General: There is no distension.     Palpations: Abdomen is soft.     Tenderness: There is abdominal tenderness. There is no guarding.  Musculoskeletal:        General: Swelling and tenderness present.     Cervical back: Normal range of motion and neck supple. No rigidity.  Skin:    General: Skin is warm.     Capillary Refill: Capillary refill takes less than 2 seconds.     Findings: No rash.     Comments: Patient has multiple superficial ulcerations and ecchymosis to all extremities most significant heels and lower extremities anterior bilateral and large deep sacral wound.  Patient has pitting edema lower extremities up into the pelvis.  Neurological:     General: No focal deficit present.     Mental Status: He is alert.     Cranial Nerves: No cranial nerve deficit.     Comments: General fatigue, answers majority of questions appropriately however mild confusion for specifics.  Patient has general weakness.  Patient unable to lift legs against gravity.  Psychiatric:        Behavior: Behavior is slowed.     Comments: Fatigue appearance    ED Results / Procedures / Treatments   Labs (all labs ordered are listed, but only abnormal results are displayed) Labs Reviewed  BASIC METABOLIC PANEL - Abnormal; Notable for the following components:      Result Value   Sodium 127 (*)  Potassium 5.2 (*)    Chloride 94 (*)    Glucose, Bld 523 (*)    BUN 115 (*)    Creatinine, Ser 4.35 (*)    Calcium 8.0 (*)    GFR, Estimated 14 (*)    All other components within normal limits   CBC - Abnormal; Notable for the following components:   WBC 11.3 (*)    RBC 4.16 (*)    MCV 101.4 (*)    Platelets 136 (*)    All other components within normal limits  CBG MONITORING, ED - Abnormal; Notable for the following components:   Glucose-Capillary 529 (*)    All other components within normal limits  CBG MONITORING, ED - Abnormal; Notable for the following components:   Glucose-Capillary 406 (*)    All other components within normal limits  CBG MONITORING, ED - Abnormal; Notable for the following components:   Glucose-Capillary 361 (*)    All other components within normal limits  RESP PANEL BY RT-PCR (FLU A&B, COVID) [id number]  URINALYSIS, ROUTINE W REFLEX MICROSCOPIC  BRAIN NATRIURETIC PEPTIDE    EKG None  Radiology CT ABDOMEN PELVIS WO CONTRAST  Result Date: 03/26/2021 CLINICAL DATA:  Abdominal distension. EXAM: CT ABDOMEN AND PELVIS WITHOUT CONTRAST TECHNIQUE: Multidetector CT imaging of the abdomen and pelvis was performed following the standard protocol without IV contrast. COMPARISON:  Sep 15, 2020. FINDINGS: Lower chest: No acute abnormality. Hepatobiliary: Gallstone is noted. No biliary dilatation is noted. Stable hepatic cysts. Pancreas: Unremarkable. No pancreatic ductal dilatation or surrounding inflammatory changes. Spleen: Normal in size without focal abnormality. Adrenals/Urinary Tract: Adrenal glands are unremarkable. Mild bilateral hydroureteronephrosis is noted without obstructing calculus. Severe urinary bladder distention is noted concerning for bladder outlet obstruction. Stomach/Bowel: Stomach is within normal limits. Appendix appears normal. No evidence of bowel wall thickening, distention, or inflammatory changes. Vascular/Lymphatic: Status post aortobifemoral bypass graft placement. Stable appearance of saccular aneurysm arising from right side of proximal abdominal aorta. No adenopathy is noted. Reproductive: Moderately enlarged prostate gland is noted.  Surgical clips are noted in the prostate gland. Other: No abdominal wall hernia or abnormality. No abdominopelvic ascites. Musculoskeletal: Status post left hip arthrodesis. No acute osseous abnormality is noted. IMPRESSION: Mild bilateral hydroureteronephrosis is noted without obstructing calculus. Severe urinary bladder distention is noted concerning for bladder outlet obstruction. Moderate prostatic enlargement is again noted with surgical clips. Status post aortobifemoral graft placement. Stable appearance of saccular aneurysm arising from right side of proximal abdominal aorta. Cholelithiasis. Electronically Signed   By: Marijo Conception M.D.   On: 03/26/2021 11:00    Procedures .Critical Care Performed by: Elnora Morrison, MD Authorized by: Elnora Morrison, MD   Critical care provider statement:    Critical care time (minutes):  40   Critical care start time:  03/26/2021 10:00 AM   Critical care end time:  03/26/2021 10:40 AM   Critical care time was exclusive of:  Separately billable procedures and treating other patients and teaching time   Critical care was necessary to treat or prevent imminent or life-threatening deterioration of the following conditions:  Metabolic crisis   Critical care was time spent personally by me on the following activities:  Pulse oximetry, evaluation of patient's response to treatment, examination of patient, re-evaluation of patient's condition, review of old charts, ordering and review of laboratory studies and ordering and review of radiographic studies   Medications Ordered in ED Medications  insulin aspart (novoLOG) injection 5 Units (has no administration in time range)  acetaminophen (TYLENOL) tablet 1,000 mg (1,000 mg Oral Given 03/26/21 1150)    ED Course  I have reviewed the triage vital signs and the nursing notes.  Pertinent labs & imaging results that were available during my care of the patient were reviewed by me and considered in my medical  decision making (see chart for details).    MDM Rules/Calculators/A&P                           Patient with complex medical history, reviewed medical records and summary of medical conditions as patient unable to provide all the details.  Patient was sent over for significant abnormal labs are drawn yesterday.  Reviewed medical records/paperwork in the chart with significant elevated creatinine greater than 3, BUN greater than 100.  Repeat blood work ordered and sent and results confirm BUN 115, hyperglycemia 529 which improved to 361 with 5 units of insulin.  Patient has hyperkalemia mild 5.2 insulin will help this and treating acute renal failure.  With abdominal pain decreasing output concern for possible prostate related versus kidney stone versus mass versus other.  CT scan ordered and reviewed showing significant prostate lodgment and acute urinary retention.  Discussed Foley/drainage and sending urine with the nurse.  Tylenol ordered for pain.  Patient has hyponatremia 129, mild leukocytosis 11.3.  Patient will need admission for further treatment evaluation, monitoring kidney function and electrolytes.  Final Clinical Impression(s) / ED Diagnoses Final diagnoses:  Acute renal failure, unspecified acute renal failure type ([place])  Skin ulcer of sacrum, unspecified ulcer stage ([place])  General weakness  Hyponatremia  Hyperkalemia  Hyperglycemia  Acute urinary retention    Rx / DC Orders ED Discharge Orders     None        Elnora Morrison, MD 03/27/21 1121

## 2021-03-26 NOTE — H&P (Addendum)
Date: 03/26/2021               Patient Name:  Chris Payne MRN: [id number]  DOB: 1950-10-03 Age / Sex: 70 y.o., male   PCP: Benito Mccreedy, MD         Medical Service: Internal Medicine Teaching Service         Attending Physician: Dr. Lucious Groves, DO    First Contact: Dr. Alvie Heidelberg Pager: [phone]  Second Contact: Dr. Allyson Sabal Pager: [phone]       After Hours (After 5p/  First Contact Pager: [phone]  weekends / holidays): Second Contact Pager: [phone]   Chief Complaint: Abnormal labs   History of Present Illness: Chris Payne is a 70 year old man with a history of COPD, HFrEF, CAD status post CABG, ruptured AAA status postrepair, CVA, PAD, hypertension and prediabetes who is presenting from [facility] facility with abnormal labs.  He was just recently admitted to our service from 11/11-11/17 for acute hypoxic respiratory failure secondary to COVID along with cellulitis of his left lower extremity, urinary retention, and AKI.  He had a successful voiding trial and discharged home without a Foley catheter.  States since that time he has been having trouble urinating.  Describes his urination as "so-so".  He is also been experiencing worsening weakness and presented to the ED on 11/21 status post fall.  At that time noted to have a large fluid-filled blister on the left shin that broke when he fell and multiple skin tears.  He was discharged to [facility] facility.  He was found to have a blood glucose in the 500s and an elevated creatinine at his facility today and sent to the emergency department.  He denies any fevers, chills, headaches, chest pain, shortness of breath, changes in his vision or headaches.  Endorses left leg swelling which has been stable since his most recent hospitalization.  Meds:  No current facility-administered medications on file prior to encounter.   Current Outpatient Medications on File Prior to Encounter  Medication Sig Dispense Refill    albuterol (VENTOLIN HFA) 108 (90 Base) MCG/ACT inhaler Inhale 2 puffs into the lungs every 6 (six) hours as needed for wheezing or shortness of breath. 18 g 0   aspirin EC 81 MG EC tablet Take 1 tablet (81 mg total) by mouth daily.     budesonide-formoterol (SYMBICORT) 160-4.5 MCG/ACT inhaler Inhale 2 puffs into the lungs 2 (two) times daily. 1 each 2   cephALEXin (KEFLEX) 500 MG capsule Take 500 mg by mouth 4 (four) times daily. Started on 03-13-21 x 2 days     furosemide (LASIX) 20 MG tablet Take 1 tablet (20 mg total) by mouth daily. (Patient not taking: Reported on 03/17/2021) 30 tablet 0   gabapentin (NEURONTIN) 300 MG capsule Take 300 mg by mouth 2 (two) times daily.     metoprolol tartrate (LOPRESSOR) 25 MG tablet Take 1 tablet (25 mg total) by mouth 2 (two) times daily. 180 tablet 0   pantoprazole (PROTONIX) 40 MG tablet Take 40 mg by mouth daily.     rosuvastatin (CRESTOR) 40 MG tablet Take 1 tablet (40 mg total) by mouth daily. 90 tablet 0   triamcinolone cream (KENALOG) 0.1 % Apply 1 application topically 2 (two) times daily. (Patient taking differently: Apply 1 application topically 2 (two) times daily as needed (rash, itching).) 30 g 0    No outpatient medications have been marked as taking for the 03/25/21 encounter [facility]  Encounter).     Allergies: Allergies as of 03/25/2021   (No Known Allergies)   Past Medical History:  Diagnosis Date   AAA (abdominal aortic aneurysm)    s/p repair 6/11   CAD (coronary artery disease)    s/p CABG 2/12:  LIMA to LAD, SVG to diagonal-1, SVG to ramus intermedius,, SVG to AM (Dr. Roxan Hockey) ;  b.  Myoview 10/13:  inf infarct with very mild peri-infarct ischemia, EF 49%, inf HK; c  He has severe three-vessel native coronary artery disease. Catheterization March 2015 as above   CAP (community acquired pneumonia) 02/23/2014   Carotid stenosis    a.  dopplers 1/15: LICA 72-62%;  b. Carotid dopplers 3/14:  R 0-39%, L 60-79%, repeat 6 mos    CHF (congestive heart failure) (HCC)    COPD (chronic obstructive pulmonary disease) (HCC)    GERD (gastroesophageal reflux disease)    Headache(784.0)    Hyperlipidemia    Hypertension    Hypoxia 02/23/2014   Inguinal hernia    Myocardial infarction [facility])    PCI x3   PAD (peripheral artery disease) ([place])    ABIs 4/12:  R 0.88, L 0.92; R SFA 40%, L CFA 50%, L SFA 50-60%   Pneumonia    hx   Stroke [facility])    h/o pontine CVA   Thyroid nodule    incidental finding on carotid doppler 3/14 => thyroid U/S ordered    Family History:  Family History  Problem Relation Age of Onset   Alcohol abuse Father    Cancer Sister 12       unknown primary   Diabetes Mother    Hypertension Mother    Cancer Sister 1       kidney cancer     Social History: Prior to admission at [facility] facility a week ago he was living at home with wife and son.  States that he requires a lot of assistance from his son with his ADLs including bathing himself, getting around the house, feeding himself and taking his medications.  He smoked about 2 packs a day for about 20 years but quit in 2011.  Previously drank alcohol but states that he has not drank in many years.  Denies any illicit drug use.  Review of Systems: A complete ROS was negative except as per HPI.   Physical Exam: Blood pressure 133/74, pulse 66, temperature 98.2 F (36.8 C), temperature source Oral, resp. rate 14, SpO2 97 %. Physical Exam General: alert, chronically ill-appearing, no acute distress, comfortably resting HEENT: Normocephalic, atraumatic, EOM intact, conjunctiva normal CV: Regular rate and rhythm, no murmurs rubs or gallops Pulm: Mild expiratory wheezing, normal work of breathing Abdomen: Soft, nondistended, bowel sounds present, no tenderness to palpation MSK: Bilateral lower extremity 2+ pitting edema L>R Skin: Bilateral lower extremity cool to touch, multiple skin tears and abrasions spanning patient's chest, bilateral upper  and lower extremities, open blister on anterior left shin with overlying erythema and weeping in nature Neuro: Alert and oriented x3   EKG: personally reviewed my interpretation is NSR  CXR: personally reviewed my interpretation is no acute cardiopulmonary process  CT Abdomen Pelvis IMPRESSION:  Mild bilateral hydroureteronephrosis is noted without obstructing  calculus. Severe urinary bladder distention is noted concerning for  bladder outlet obstruction. Moderate prostatic enlargement is again  noted with surgical clips.   Status post aortobifemoral graft placement. 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  Cholelithiasis.   Assessment & Plan by Problem: Principal Problem:   AKI (acute kidney injury) ([place]) Active Problems:   PAD (peripheral artery disease) (HCC)   BPH (benign prostatic hyperplasia)   Pressure injury of skin   HFrEF (heart failure with reduced ejection fraction) ([place])   Urinary retention  Chris Payne is a 70 year old man with a history of COPD, HFrEF, CAD status post CABG, ruptured AAA status postrepair, CVA, PAD, hypertension and prediabetes who presented from [place] facility with abnormal labs admitted for AKI, hyperglycemia and urinary retention  AKI Urinary retention BPH 4.35 on admission, baseline approximately 0.95-1.34 with a GFR 57.  Likely in the setting of urinary retention and BPH.  CT abdomen showed bilateral hydroureteronephrosis without obstructing calculus and severe urinary bladder distention concerning for bladder outlet obstruction.  Moderate prostate enlargement again noted.  During last hospital admission patient had urinary retention and was discharged without a Foley catheter due to successful voiding trial.  Unfortunately has had difficulty urinating since discharge and an AKI likely in the setting of urinary retention due to BPH.  Previously was on tamsulosin but do not see  this on his medication list.  Approximately 3L and foley catheter in place. UA pending.  I suspect that his renal function will improve with Foley catheter in place and good urinary output. -Trend BMP -Strict ins and outs -Avoid nephrotoxic agents -Voiding trial in the next 1 to 2 days -Restart tamsulosin  Left lower extremity swelling  Recent LLE cellulitis Patient continues to have left lower extremity swelling and erythema.  He recently fell and sustained an open blister on his left anterior shin.  He completed a 5-day course of Keflex.  During his last hospitalization DVT study was unremarkable.  ABIs showed moderate bilateral PVD and absent left great toe waveform on the left.  Vascular surgery was to follow-up outpatient.  He also had an MRI of his left lower extremity which showed no evidence of osteo but remarkable skin thickening and soft tissue edema consistent with cellulitis.  He continues to have left lower extremity erythema and weeping along the open blister on his left anterior shin.  Mild leukocytosis, 11.3 significantly improved from 9 days ago and was 14.  Afebrile and vitals otherwise stable.  Can consider continuing Keflex for longer course.  Avoiding compression stockings to left leg given severe peripheral artery disease. -Wound care consult to address open blister  Hyperkalemia Hyponatremia Potassium 5.2.  Given insulin for hyperglycemia this will likely correct his hyperkalemia as well.  Sodium 127, suspect this is due to poor oral intake.  Will continue to monitor. -Trend BMP -Follow up serum and urine osm  Hyperglycemia Blood glucose 523 on admission.  History of prediabetes.  Last hemoglobin A1c was 1 year ago.  Metformin was discontinued on his last hospitalization. Possibly in the setting of recent steroid use for COPD exacerbation. -Follow-up hemoglobin A1c -SSI -CBG monitoring  HFrEF Ischemic cardiomyopathy Last echocardiogram 03/08/2021 showed an EF 55 to  60% with mild left ventricular hypertrophy, grade 1 diastolic dysfunction and normal RV systolic function.  He is volume up on exam with notable L>R lower extremity pitting edema and cool to touch extremities. -Continue to hold Entresto and Lasix in the setting of AKI -Strict ins and outs -Daily weights -Follow-up BNP  CAD status post CABG in 2012 with severe three-vessel disease (2018) PAD Carotid stenosis, L>R History of pontine infarct and TIA Stress test performed in 2020 showing moderate to large  sized inferior/lateral transmural scar with peri-infarct ischemia and akinesis. His home meds include ASA 81 mg daily and rosuvastatin 40 mg daily.  Bilateral extremities are cool to touch, recent ABIs during last admission showed moderate right and left lower extremity arterial disease with absent left great toe waveform. -Continue aspirin and rosuvastatin  Hypertension AAA status postrepair in 2011 -Continue metoprolol  Sacral ulcer Decubitus pressure wound noted during last hospitalization.  Get sacral pressure pad to offload sacrum.  Recent COVID infection COPD Recently admitted from 11/11-11/17 for acute hypoxic respiratory failure in the setting of COVID-19 and COPD exacerbation.  Home medications include Symbicort and albuterol, continue inpatient alternative Breo Ellipta and albuterol as needed. Of note, no PFTs on file.  Degenerative disc disease Chronic pain syndrome -Hold gabapentin 300 mg twice daily in setting of AKI  GERD -Continue daily PPI  Diet: HH/carb modified with fluid restriction VTE prophylaxis: Lovenox Full code  Dispo: Admit patient to Inpatient with expected length of stay greater than 2 midnights.  SignedMike Craze, DO 03/26/2021, 2:58 PM  Pager: [phone] After 5pm on weekdays and 1pm on weekends: On Call pager: [phone]

## 2021-03-26 NOTE — ED Notes (Signed)
Admitting MD paged regarding pts BP 79/54

## 2021-03-27 DIAGNOSIS — N179 Acute kidney failure, unspecified: Secondary | ICD-10-CM

## 2021-03-27 DIAGNOSIS — L89600 Pressure ulcer of unspecified heel, unstageable: Secondary | ICD-10-CM

## 2021-03-27 LAB — CBC WITH DIFFERENTIAL/PLATELET
Abs Immature Granulocytes: 0.05 10*3/uL (ref 0.00–0.07)
Basophils Absolute: 0 10*3/uL (ref 0.0–0.1)
Basophils Relative: 0 %
Eosinophils Absolute: 0 10*3/uL (ref 0.0–0.5)
Eosinophils Relative: 0 %
HCT: 44.6 % (ref 39.0–52.0)
Hemoglobin: 14.7 g/dL (ref 13.0–17.0)
Immature Granulocytes: 1 %
Lymphocytes Relative: 8 %
Lymphs Abs: 0.8 10*3/uL (ref 0.7–4.0)
MCH: 33.4 pg (ref 26.0–34.0)
MCHC: 33 g/dL (ref 30.0–36.0)
MCV: 101.4 fL — ABNORMAL HIGH (ref 80.0–100.0)
Monocytes Absolute: 0.9 10*3/uL (ref 0.1–1.0)
Monocytes Relative: 9 %
Neutro Abs: 7.6 10*3/uL (ref 1.7–7.7)
Neutrophils Relative %: 82 %
Platelets: 135 10*3/uL — ABNORMAL LOW (ref 150–400)
RBC: 4.4 MIL/uL (ref 4.22–5.81)
RDW: 13.3 % (ref 11.5–15.5)
WBC: 9.3 10*3/uL (ref 4.0–10.5)
nRBC: 0 % (ref 0.0–0.2)

## 2021-03-27 LAB — BASIC METABOLIC PANEL
Anion gap: 14 (ref 5–15)
BUN: 98 mg/dL — ABNORMAL HIGH (ref 8–23)
CO2: 20 mmol/L — ABNORMAL LOW (ref 22–32)
Calcium: 8.4 mg/dL — ABNORMAL LOW (ref 8.9–10.3)
Chloride: 101 mmol/L (ref 98–111)
Creatinine, Ser: 2.98 mg/dL — ABNORMAL HIGH (ref 0.61–1.24)
GFR, Estimated: 22 mL/min — ABNORMAL LOW (ref >60)
Glucose, Bld: 163 mg/dL — ABNORMAL HIGH (ref 70–99)
Potassium: 4.6 mmol/L (ref 3.5–5.1)
Sodium: 135 mmol/L (ref 135–145)

## 2021-03-27 LAB — CBG MONITORING, ED
Glucose-Capillary: 184 mg/dL — ABNORMAL HIGH (ref 70–99)
Glucose-Capillary: 240 mg/dL — ABNORMAL HIGH (ref 70–99)

## 2021-03-27 LAB — LACTIC ACID, PLASMA
Lactic Acid, Venous: 3.3 mmol/L (ref 0.5–1.9)
Lactic Acid, Venous: 1.3 mmol/L (ref 0.5–1.9)

## 2021-03-27 LAB — GLUCOSE, CAPILLARY
Glucose-Capillary: 196 mg/dL — ABNORMAL HIGH (ref 70–99)
Glucose-Capillary: 160 mg/dL — ABNORMAL HIGH (ref 70–99)

## 2021-03-27 LAB — C-REACTIVE PROTEIN: CRP: 21.5 mg/dL — ABNORMAL HIGH (ref <1.0)

## 2021-03-27 MED ORDER — CHLORHEXIDINE GLUCONATE CLOTH 2 % EX PADS
6.0000 | MEDICATED_PAD | Freq: Every day | CUTANEOUS | Status: DC
  Administered 2021-03-27 – 2021-03-28 (×2): 6 via TOPICAL

## 2021-03-27 MED ORDER — HYDROMORPHONE HCL 1 MG/ML PO LIQD
0.5000 mg | Freq: Once | ORAL | Status: AC
  Administered 2021-03-27: 0.5 mg via ORAL
  Filled 2021-03-27: qty 1

## 2021-03-27 MED ORDER — LACTATED RINGERS IV BOLUS
1000.0000 mL | Freq: Once | INTRAVENOUS | Status: AC
  Administered 2021-03-27: 1000 mL via INTRAVENOUS

## 2021-03-27 MED ORDER — LACTATED RINGERS IV BOLUS: 500.0000 mL | Freq: Once | INTRAVENOUS | Status: DC

## 2021-03-27 MED ORDER — VANCOMYCIN HCL 1000 MG/200ML IV SOLN: 1000.0000 mg | INTRAVENOUS | Status: DC

## 2021-03-27 MED ORDER — HYDROMORPHONE HCL 1 MG/ML PO LIQD: 0.5000 mg | Freq: Once | ORAL | Status: DC

## 2021-03-27 MED ORDER — LACTATED RINGERS IV SOLN: INTRAVENOUS | Status: AC

## 2021-03-27 NOTE — Consult Note (Signed)
[place] Nurse Consult Note: Patient receiving care in [facility] ED040 Patient has 4+ pitting edema BLE Brought in from [facility] with CBG of 503 at facility and 583 from [place]. Discharged from [facility] on 03/18/21 Reason for Consult: left anterior shin open blister Wound type: Multiple scattered wounds  LLE shin skin tear measures 9 x 7 with partial skin flap. Pink and red with tan drainage Left heel has a blister on the lateral side and an unstageable on the mid side of the heel  Right knee has a full thickness wound that measures 2 x 2 and is 50% yellow and 50% black with purulent drainage. Right forearm has a small skin tear without skin flap that measures 0.5 x 0.2 and is pink with small amount of drainage.  Base of left thumb has a partial thickness skin tear that has a partial flap and is draining purulent drainage.  Pressure Injury POA: Yes Periwound: Very fragile thin skin with multiple areas of bruising. Dressing procedure/placement/frequency: Patient has hx of PAD, compression not recommended but will wrap the LLE with Kerlix and Ace wrap which is not considered compression.  LLE shin wound: Place a piece of Xeroform gauze over the wound, cover with 4 x 4s and wrap with Kerlix and Ace wrap. Change daily Left heel: place a piece of Xeroform gauze over the blister on the lateral side of the heel and over the unstageable PI on the base of the heel. Wrap with the Kerlix and Ace wrap from just below the toes to just below the knee. DO NOT WRAP TIGHT. Change daily Base of the left thumb: Clean with NS and use to release the Aquacel that is on the wound. Place a cut to fit piece of Aquacel Advantage Kellie Simmering # [phone]) over the wound and wrap with a few wraps of Kerlix. Change daily.  Right knee: Clean with NS, pat dry then place a piece of Aquacel Advantage over the wound and secure with foam dressing. Change Aquacel daily. Right forearm: Clean with NS then place a piece of Aquacel Advantage over the wound  and wrap with a few turns of Kerlix. Change daily.  Place both feet in [facility] [place] # [phone])  Monitor the wound area(s) for worsening of condition such as: Signs/symptoms of infection, increase in size, development of or worsening of odor, development of pain, or increased pain at the affected locations.   Notify the medical team if any of these develop.  Thank you for the consult. [place] nurse will not follow at this time.   Please re-consult the [place] team if needed.  Cathlean Marseilles Tamala Julian, MSN, RN, [place], [place]le Pearl, [facility] Wound Treatment Associate Pager [phone]

## 2021-03-27 NOTE — Progress Notes (Signed)
Pharmacy Antibiotic Note  Chris Payne is a 70 y.o. male admitted on 03/25/2021 with cellulitis.  Pharmacy has been consulted for Vancomycin dosing. WBC 11.3. Noted renal dysfunction (4.35>3.43). Baseline appear around 1.5-2.   Plan: Vancomycin 1000 mg IV q48h >>>Estimated AUC: 489 Trend WBC, temp, renal function  F/U infectious work-up Drug levels as indicated      Temp (24hrs), Avg:98.5 F (36.9 C), Min:98 F (36.7 C), Max:99.4 F (37.4 C)  Recent Labs  Lab 03/25/21 1735 03/26/21 2028  WBC 11.3*  --   CREATININE 4.35* 3.43*    Estimated Creatinine Clearance: 18.5 mL/min (A) (by C-G formula based on SCr of 3.43 mg/dL (H)).    No Known Allergies  Narda Bonds, PharmD, BCPS Clinical Pharmacist Phone: [phone]

## 2021-03-27 NOTE — Progress Notes (Signed)
Paged early 11/30 PM that patient was hypotensive to 49T systollic. He was asymptomatic. Stable on RA. Lactate was elevated to 3.3, CRP 21.5. CXR was unremarkable. Patient was given 1L LR bolus x2. Bcx were obtained. Given patient's significant skin breakdown and what appeared to be pus on the bandages of the LLE vancomycin was started. His blood pressures improved to the 552Z systolic.   F/u Bcx Continue antibiotics

## 2021-03-27 NOTE — Progress Notes (Signed)
HD#1 SUBJECTIVE:  Patient Summary: [name] is a 70 year old man with a history of COPD, HFrEF, CAD status post CABG, ruptured AAA status postrepair, CVA, PAD, hypertension and prediabetes who is presenting from [place] with urinary retention and abnormal labs.  Overnight Events: NAEON  Interim History: This morning, patient is alert but disoriented, not knowing that he is in the hospital, unable to give an account of recent events. He reports good oral intake and has food nearby. He denies being in any significant pain.    OBJECTIVE:  Vital Signs: Vitals:   03/27/21 1215 03/27/21 1230 03/27/21 1321 03/27/21 1405  BP: 98/66 108/74  (!) 102/58  Pulse: 77 81  88  Resp: 14 19  (!) 21  Temp:   (!) 97.3 F (36.3 C)   TempSrc:   Axillary   SpO2: 97% 97%        Intake/Output Summary (Last 24 hours) at 03/27/2021 1436 Last data filed at 03/27/2021 3893 Gross per 24 hour  Intake 2200 ml  Output 2300 ml  Net -100 ml   Net IO Since Admission: -3,100 mL [03/27/21 1436]  Physical Exam: Physical Exam Vitals reviewed.  Cardiovascular:     Rate and Rhythm: Normal rate and regular rhythm.  Pulmonary:     Effort: Pulmonary effort is normal.     Breath sounds: Normal breath sounds.  Abdominal:     General: Bowel sounds are normal.     Palpations: Abdomen is soft.     Tenderness: There is no abdominal tenderness.     Comments: No suprapubic tenderness  Neurological:     Mental Status: He is alert. He is disoriented.     Patient Lines/Drains/Airways Status     Active Line/Drains/Airways     Name Placement date Placement time Site Days   Peripheral IV 03/26/21 20 G 1" Left;Anterior;Proximal Forearm 03/26/21  1346  Forearm  1   Urethral Catheter Kathlee Nations RN Straight-tip 16 Fr. 03/26/21  1225  Straight-tip  1   Pressure Injury 03/07/20 Sacrum Right Stage 1 -  Intact skin with non-blanchable redness of a localized area usually over a bony prominence. 03/07/20   2003  -- 385             ASSESSMENT/PLAN:  Assessment: [name] is a 70 year old man with a history of COPD, HFrEF, CAD status post CABG, ruptured AAA status postrepair, CVA, PAD, hypertension and prediabetes who is presenting from [place] with urinary retention and abnormal labs.  Plan: #Urinary retention #AKI #BPH CT A/P showing moderate prostatic enlargement with severe bladder distention. Foley placed with large volume output. Currently 5L out over the past 24 hrs. AKI initially with Cr of 4.35, now down to 3, almost certainly post-renal. Patient has a baseline of 0.95-1.35. He has developed some hypotension with the large output, likely related to dehydration.We will watch his BP closely and replace fluids with IV as needed. Likely will keep foley in for 3-4 weeks at this point and have hime f/u with outpatient Urology.  -Watch BP, I and O's -IVF as needed, remove fluid restriction -Foley in place -Flomax -Follow Cr with daily BMPs -Resume home gabapentin when Cr normalizes -Outpatient Urology f/u   #LLE shin wound #Pressure injury of the heel Patient with Hx of LLE cellulitis, treated with keflex. Report of recent fall, resulting in injury to the left shin with resulting blister that has since opened. Given one dose of vanc out of concern  for hypotension overnight.  -Wound care consult, s/o -D/C vanc -Monitor wound for signs of infection -F/u blood cultures -Dressing without compression given PAD Hx  #Hyperglycemia (resolved) Patient with recent course of steroids, Hx of prediabetes, with CBGs in the 500's. Easily resolved with 5U of novolog. Found to have A1C of 8.2.  -Moderate SSI   #Other problems, chronic and stable CAD status post CABG in 2012 with severe three-vessel disease (2018) PAD Carotid stenosis, L>R History of pontine infarct and TIA HFrEF Ischemic cardiomyopathy Hypertension AAA status postrepair in 2011 Degenerative disc  disease Chronic pain syndrome   FEN/GI: HH diet VTE: Lovenox Dispo: Likely return to SNF Code Status: FULL   Signature: Corky Sox, MD PGY-1 Pager: [phone]  Please contact the on call pager after 5 pm and on weekends at [phone].

## 2021-03-27 NOTE — Plan of Care (Signed)
  Problem: Education: Goal: Knowledge of General Education information will improve Description Including pain rating scale, medication(s)/side effects and non-pharmacologic comfort measures Outcome: Progressing   

## 2021-03-27 NOTE — Progress Notes (Signed)
   03/27/21 2138  Assess: MEWS Score  Temp 98.5 F (36.9 C)  BP 102/73  Pulse Rate (!) 118  Resp 16  SpO2 100 %  O2 Device Room Air  Assess: MEWS Score  MEWS Temp 0  MEWS Systolic 0  MEWS Pulse 2  MEWS RR 0  MEWS LOC 0  MEWS Score 2  MEWS Score Color Yellow  Assess: if the MEWS score is Yellow or Red  Were vital signs taken at a resting state? Yes  Focused Assessment No change from prior assessment  Early Detection of Sepsis Score *See Row Information* Low  MEWS guidelines implemented *See Row Information* Yes  Treat  MEWS Interventions Administered prn meds/treatments  Take Vital Signs  Increase Vital Sign Frequency  Yellow: Q 2hr X 2 then Q 4hr X 2, if remains yellow, continue Q 4hrs  Escalate  MEWS: Escalate Yellow: discuss with charge nurse/RN and consider discussing with provider and RRT  Notify: Charge Nurse/RN  Name of Charge Nurse/RN Notified Dorthula Nettles., RN  Date Charge Nurse/RN Notified 03/27/21  Time Charge Nurse/RN Notified 2140  Notify: Provider  Provider Name/Title MD Jeanice Lim  Date Provider Notified 03/27/21  Time Provider Notified 2157  Notification Type Page  Notification Reason Other (Comment) (yellow mews)  Provider response No new orders;Other (Comment) (PRN med given, continue to monitor)  Date of Provider Response 03/27/21  Time of Provider Response 2200  Notify: Rapid Response  Name of Rapid Response RN Notified MD page  Document  Patient Outcome Other (Comment) (continue to monitor)  Progress note created (see row info) Yes

## 2021-03-27 NOTE — Progress Notes (Signed)
NEW ADMISSION NOTE New Admission Note:   Arrival Method: ED Stretcher Mental Orientation: AAOx2 Telemetry: [id number] Assessment: Completed Skin: See WOC note and flowsheet IV: LFA Pain: 2/10 Tubes: foley Safety Measures: Safety Fall Prevention Plan has been given, discussed and signed Admission: Completed [facility] Orientation: Patient has been orientated to the room, unit and staff.  Family: none at bedside  Orders have been reviewed and implemented. Will continue to monitor the patient. Call light has been placed within reach and bed alarm has been activated.   Vira Agar, RN

## 2021-03-27 NOTE — Hospital Course (Signed)
12/1: -stomach is hurting for past 2 days  Not oriented to place or time. Unsure why he came to the hospital

## 2021-03-27 NOTE — ED Notes (Signed)
RN attempted to obtain blood cultures, unsuccessful. Phlebotomy to collect

## 2021-03-27 NOTE — Progress Notes (Signed)
Together with this Probation officer wasted dilaudid 0.5mg  liquid oral in stericycle witnessed by CN Dorthula Nettles., RN

## 2021-03-27 NOTE — ED Notes (Signed)
IMTS team at bedside

## 2021-03-27 NOTE — Progress Notes (Addendum)
Inpatient Diabetes Program Recommendations  AACE/ADA: New Consensus Statement on Inpatient Glycemic Control (2015)  Target Ranges:  Prepandial:   less than 140 mg/dL      Peak postprandial:   less than 180 mg/dL (1-2 hours)      Critically ill patients:  140 - 180 mg/dL   Lab Results  Component Value Date   GLUCAP 240 (H) 03/27/2021   HGBA1C 8.2 (H) 03/26/2021    Latest Reference Range & Units 03/08/21 09:26 03/26/21 20:28  Hemoglobin A1C 4.8 - 5.6 % 6.1 (H) 8.2 (H)   Review of Glycemic Control  Latest Reference Range & Units 03/26/21 02:16 03/26/21 11:35 03/26/21 17:09 03/26/21 21:49 03/27/21 07:43 03/27/21 11:28  Glucose-Capillary 70 - 99 mg/dL 406 (H) 361 (H) 270 (H) 159 (H) 184 (H) 240 (H)   Diabetes history: Pre-diabetes  Outpatient Diabetes medications: None however patient was discharged home on PO steroids during last hospitalization (d/c'd on Decadron 6 mg daily for 5 days) Current orders for Inpatient glycemic control:  Novolog moderate tid with meals  Inpatient Diabetes Program Recommendations:    It appears his A1C on 03/08/21 was 6.1% however he was d/c'd with Decadron 6 mg daily for 5 days.  A1C is now 8.1% however, likely due to high blood sugars while on steroids.  Metformin did have to be stopped during last admission.  May want to add DPP-4 inhibitor, such as Tradjenta 5 mg daily at discharge??  Anticipate that blood sugars should improve since steroids stopped?  Will follow.    Thanks,  Adah Perl, RN, BC-ADM Inpatient Diabetes Coordinator Pager [phone]  (8a-5p)

## 2021-03-27 NOTE — ED Notes (Signed)
Oncall MD paged regarding pts BP 85/44 (56)

## 2021-03-28 DIAGNOSIS — N401 Enlarged prostate with lower urinary tract symptoms: Principal | ICD-10-CM

## 2021-03-28 DIAGNOSIS — L89150 Pressure ulcer of sacral region, unstageable: Secondary | ICD-10-CM | POA: Diagnosis not present

## 2021-03-28 DIAGNOSIS — R338 Other retention of urine: Secondary | ICD-10-CM

## 2021-03-28 DIAGNOSIS — N179 Acute kidney failure, unspecified: Secondary | ICD-10-CM | POA: Diagnosis not present

## 2021-03-28 LAB — BASIC METABOLIC PANEL
Anion gap: 9 (ref 5–15)
BUN: 43 mg/dL — ABNORMAL HIGH (ref 8–23)
CO2: 23 mmol/L (ref 22–32)
Calcium: 7.8 mg/dL — ABNORMAL LOW (ref 8.9–10.3)
Chloride: 107 mmol/L (ref 98–111)
Creatinine, Ser: 1.26 mg/dL — ABNORMAL HIGH (ref 0.61–1.24)
GFR, Estimated: >60 mL/min (ref >60)
Glucose, Bld: 151 mg/dL — ABNORMAL HIGH (ref 70–99)
Potassium: 3.1 mmol/L — ABNORMAL LOW (ref 3.5–5.1)
Sodium: 139 mmol/L (ref 135–145)

## 2021-03-28 LAB — GLUCOSE, CAPILLARY
Glucose-Capillary: 160 mg/dL — ABNORMAL HIGH (ref 70–99)
Glucose-Capillary: 136 mg/dL — ABNORMAL HIGH (ref 70–99)
Glucose-Capillary: 177 mg/dL — ABNORMAL HIGH (ref 70–99)
Glucose-Capillary: 128 mg/dL — ABNORMAL HIGH (ref 70–99)

## 2021-03-28 LAB — CBC
HCT: 39 % (ref 39.0–52.0)
Hemoglobin: 12.8 g/dL — ABNORMAL LOW (ref 13.0–17.0)
MCH: 32.8 pg (ref 26.0–34.0)
MCHC: 32.8 g/dL (ref 30.0–36.0)
MCV: 100 fL (ref 80.0–100.0)
Platelets: 130 10*3/uL — ABNORMAL LOW (ref 150–400)
RBC: 3.9 MIL/uL — ABNORMAL LOW (ref 4.22–5.81)
RDW: 13.4 % (ref 11.5–15.5)
WBC: 8.4 10*3/uL (ref 4.0–10.5)
nRBC: 0 % (ref 0.0–0.2)

## 2021-03-28 MED ORDER — DAKINS (1/4 STRENGTH) 0.125 % EX SOLN
Freq: Two times a day (BID) | CUTANEOUS | Status: DC
  Filled 2021-03-28: qty 473

## 2021-03-28 MED ORDER — METOPROLOL TARTRATE 25 MG PO TABS
25.0000 mg | ORAL_TABLET | Freq: Two times a day (BID) | ORAL | Status: DC
  Administered 2021-03-28 (×2): 25 mg via ORAL
  Filled 2021-03-28 (×2): qty 1

## 2021-03-28 MED ORDER — TAMSULOSIN HCL 0.4 MG PO CAPS: 0.4000 mg | ORAL_CAPSULE | Freq: Every day | ORAL | Status: DC

## 2021-03-28 MED ORDER — POTASSIUM CHLORIDE 20 MEQ PO PACK
40.0000 meq | PACK | Freq: Once | ORAL | Status: AC
  Administered 2021-03-28: 40 meq via ORAL
  Filled 2021-03-28: qty 2

## 2021-03-28 MED ORDER — GABAPENTIN 300 MG PO CAPS
300.0000 mg | ORAL_CAPSULE | Freq: Two times a day (BID) | ORAL | Status: DC
  Administered 2021-03-28 (×2): 300 mg via ORAL
  Filled 2021-03-28 (×2): qty 1

## 2021-03-28 MED ORDER — INSULIN GLARGINE-YFGN 100 UNIT/ML ~~LOC~~ SOLN
5.0000 [IU] | Freq: Every day | SUBCUTANEOUS | Status: DC
  Administered 2021-03-28: 5 [IU] via SUBCUTANEOUS
  Filled 2021-03-28 (×2): qty 0.05

## 2021-03-28 NOTE — Evaluation (Signed)
Occupational Therapy Evaluation Patient Details Name: Chris Payne MRN: [id number] DOB: 1951-03-31 Today's Date: 03/28/2021   History of Present Illness Pt is a 70 y.o. male who presented to [facility] hospital 03/26/2021 from [facility] SNF due to blood glucose of 500 and elevated creatinine with urinary retention. PMH of COPD, HFrEFm CAD s/p CABG, Ruptured AAA s/p repair, CVA, HTN, Prediabetes.   Clinical Impression   Pt familiar to this therapist from previous admission in which he required moderate assistance to squat pivot to chair and set up to total assist for ADL. Pt cooperative for assessment. He presents with generalized weakness, impaired cognition with visual hallucinations and poor sitting balance. Pt requires max assist for bed level mobility and was unable to stand with assist of one from elevated bed. He requires set up to total assist for ADL. Recommend continued rehab in SNF upon discharge. Will follow acutely.      Recommendations for follow up therapy are one component of a multi-disciplinary discharge planning process, led by the attending physician.  Recommendations may be updated based on patient status, additional functional criteria and insurance authorization.   Follow Up Recommendations  Skilled nursing-short term rehab (<3 hours/day)    Assistance Recommended at Discharge Frequent or constant Supervision/Assistance  Functional Status Assessment  Patient has had a recent decline in their functional status and/or demonstrates limited ability to make significant improvements in function in a reasonable and predictable amount of time  Equipment Recommendations  Other (comment) (defer to next venue)    Recommendations for Other Services       Precautions / Restrictions Precautions Precautions: Fall Precaution Comments: foley, prevalon boots Restrictions Weight Bearing Restrictions: No      Mobility Bed Mobility Overal bed mobility: Needs Assistance Bed Mobility:  Supine to Sit;Sit to Supine     Supine to sit: Max assist Sit to supine: Max assist        Transfers                  General transfer comment: did not attempt in absence of second person      Balance Overall balance assessment: Needs assistance Sitting-balance support: Bilateral upper extremity supported;Feet supported Sitting balance-Leahy Scale: Poor Sitting balance - Comments: reliant on BUE support of bed Postural control: Posterior lean                                 ADL either performed or assessed with clinical judgement   ADL Overall ADL's : Needs assistance/impaired Eating/Feeding: Set up;Bed level   Grooming: Wash/dry hands;Wash/dry face;Bed level;Set up   Upper Body Bathing: Maximal assistance;Sitting   Lower Body Bathing: Total assistance;Bed level   Upper Body Dressing : Maximal assistance;Sitting   Lower Body Dressing: Total assistance;Bed level       Toileting- Clothing Manipulation and Hygiene: Total assistance;Bed level               Vision Ability to See in Adequate Light: 0 Adequate       Perception     Praxis      Pertinent Vitals/Pain Pain Assessment: Faces Faces Pain Scale: Hurts little more Pain Location: generalized with mobility Pain Descriptors / Indicators: Grimacing;Guarding Pain Intervention(s): Monitored during session;Repositioned     Hand Dominance Right   Extremity/Trunk Assessment Upper Extremity Assessment Upper Extremity Assessment: Generalized weakness   Lower Extremity Assessment Lower Extremity Assessment: Generalized weakness   Cervical / Trunk  Assessment Cervical / Trunk Assessment: Kyphotic;Other exceptions (weakness)   Communication Communication Communication: No difficulties   Cognition Arousal/Alertness: Awake/alert Behavior During Therapy: Flat affect Overall Cognitive Status: Impaired/Different from baseline Area of Impairment: Orientation;Memory;Following  commands;Safety/judgement;Awareness;Problem solving                 Orientation Level: Disoriented to;Time;Situation;Place   Memory: Decreased short-term memory Following Commands: Follows one step commands with increased time Safety/Judgement: Decreased awareness of safety;Decreased awareness of deficits Awareness: Intellectual Problem Solving: Slow processing;Decreased initiation;Difficulty sequencing;Requires verbal cues;Requires tactile cues       General Comments  VSS on RA    Exercises    Shoulder Instructions      Home Living Family/patient expects to be discharged to:: [place]: [place] (2 wheels);Wheelchair - manual;Cane - single point          Prior Functioning/Environment Prior Level of Function : Needs assist       Physical Assist : ADLs (physical);Mobility (physical) Mobility (physical): Gait;Stairs ADLs (physical): Bathing;Dressing;IADLs Mobility Comments: pt needing mod assist for transfers last hospitalization ADLs Comments: pt needing assist for bathing, dressing and toileting last hospitalization        OT Problem List: Decreased strength;Decreased activity tolerance;Impaired balance (sitting and/or standing);Decreased knowledge of use of DME or AE;Decreased cognition;Decreased coordination;Pain      OT Treatment/Interventions: Self-care/ADL training;DME and/or AE instruction;Therapeutic activities;Patient/family education;Balance training;Cognitive remediation/compensation    OT Goals(Current goals can be found in the care plan section) Acute Rehab OT Goals OT Goal Formulation: With patient Time For Goal Achievement: 04/11/21 Potential to Achieve Goals: Fair ADL Goals Pt Will Perform Grooming: with min assist;sitting Pt Will Perform Upper Body Dressing: with min assist;sitting Pt Will Transfer to Toilet: with +2 assist;with mod assist;stand pivot transfer;bedside commode Additional ADL Goal #1:  Pt will demonstrate fair sitting balance at EOB. Additional ADL Goal #2: Pt will perform bed mobility with moderate assistance in preparation for ADL.  OT Frequency: Min 2X/week   Barriers to D/C:            Co-evaluation              AM-PAC OT "6 Clicks" Daily Activity     Outcome Measure Help from another person eating meals?: A Little Help from another person taking care of personal grooming?: A Little Help from another person toileting, which includes using toliet, bedpan, or urinal?: Total Help from another person bathing (including washing, rinsing, drying)?: A Lot Help from another person to put on and taking off regular upper body clothing?: A Lot Help from another person to put on and taking off regular lower body clothing?: Total 6 Click Score: 12   End of Session    Activity Tolerance: Patient tolerated treatment well Patient left: in bed;with call bell/phone within reach;with bed alarm set  OT Visit Diagnosis: Muscle weakness (generalized) (M62.81);Other symptoms and signs involving cognitive function                Time: 1430-1450 OT Time Calculation (min): 20 min Charges:  OT General Charges $OT Visit: 1 Visit OT Evaluation $OT Eval Moderate Complexity: 1 Mod Nestor Lewandowsky, OTR/L Acute Rehabilitation Services Pager: [phone] Office: [phone]  Malka So 03/28/2021, 3:00 PM

## 2021-03-28 NOTE — Consult Note (Addendum)
WOC Nurse Consult Note: Patient receiving care in [facility] 5M22 May be DCd back to [facility] today. Wound recommend staying for another day to allow this treatment to get started and then to continue at the SNF Reason for Consult: Sacral wound Wound type: Unstageable PI to the coccyx Pressure Injury POA: Yes Measurement: 5 cm x 5.5 cm Wound bed: 80% black eschar with 20% pink/red tissue Drainage (amount, consistency, odor) tan brown on foam dressing.  Periwound: erythematous Dressing procedure/placement/frequency: Moisten Kerlix with Dakin's solution, insert into the wound, followed by dry gauze, cover with ABD pads and secure with Medipore tape. Twice daily dressing changes x 5 days. Once this order has expired then it will be followed by twice daily NS wet to dry dressings.   Monitor the wound area(s) for worsening of condition such as: Signs/symptoms of infection, increase in size, development of or worsening of odor, development of pain, or increased pain at the affected locations.   Notify the medical team if any of these develop.  Thank you for the consult. [place] nurse will not follow at this time.   Please re-consult the [place] team if needed.  Cathlean Marseilles Tamala Julian, MSN, RN, [place], Lysle Pearl, [facility] Wound Treatment Associate Pager [phone]

## 2021-03-28 NOTE — TOC Transition Note (Signed)
Transition of Care [facility]) - CM/SW Discharge Note   Patient Details  Name: Chris Payne MRN: [id number] Date of Birth: November 03, 1950  Transition of Care [facility]) CM/SW Contact:  Milas Gain, [place] Phone Number: 03/28/2021, 4:36 PM   Clinical Narrative:     Patient will DC to: [place] date: 03/28/2021  Family notified: Toula Moos  Transport by: Corey Harold  ?  Per MD patient ready for DC to [facility]. . RN, patient, patient's family, and facility notified of DC. Discharge Summary sent to facility. RN given number for report tele# [phone] RM# [phone]. DC packet on chart. Ambulance transport requested for patient.  CSW signing off.   Final next level of care: Skilled Nursing Facility Barriers to Discharge: No Barriers Identified   Patient Goals and CMS Choice Patient states their goals for this hospitalization and ongoing recovery are:: SNF CMS Medicare.gov Compare Post Acute Care list provided to:: Patient Represenative (must comment) (patients son n law) Choice offered to / list presented to :  (Patients son n law)  Discharge Placement              Patient chooses bed at: [facility] Patient to be transferred to facility by: [place] Name of family member notified: Toula Moos Patient and family notified of of transfer: 03/28/21  Discharge Plan and Services In-house Referral: Clinical Social Work                                   Social Determinants of Health (SDOH) Interventions     Readmission Risk Interventions No flowsheet data found.

## 2021-03-28 NOTE — Evaluation (Signed)
Physical Therapy Evaluation Patient Details Name: Chris Payne MRN: [id number] DOB: January 29, 1951 Today's Date: 03/28/2021  History of Present Illness  Pt is a 70 y.o. male who presented to [facility] hospital 03/26/2021 from [facility] due to blood glucose of 500 and elevated creatinine. PMH of COPD, HFrEFm CAD s/p CABG, Ruptured AAA s/p repair, CVA, HTN, Prediabetes.  Clinical Impression  Pt presents to PT with deficits in functional mobility, gait, balance, strength, power, endurance, and likely cognition. Pt is very stiff with limited strength and AROM noted. Pt requires significant assistance to mobilize in the bed, with impaired core and LE strength. In sitting pt is reliant on UE support to prevent loss of balance. Pt will benefit from continued acute PT services in an effort to improve strength and reduce caregiver burden.     Recommendations for follow up therapy are one component of a multi-disciplinary discharge planning process, led by the attending physician.  Recommendations may be updated based on patient status, additional functional criteria and insurance authorization.  Follow Up Recommendations Skilled nursing-short term rehab (<3 hours/day)    Assistance Recommended at Discharge Frequent or constant Supervision/Assistance  Functional Status Assessment Patient has had a recent decline in their functional status and demonstrates the ability to make significant improvements in function in a reasonable and predictable amount of time.  Equipment Recommendations   (mechanical lift if D/C home)    Recommendations for Other Services       Precautions / Restrictions Precautions Precautions: Fall Restrictions Weight Bearing Restrictions: No      Mobility  Bed Mobility Overal bed mobility: Needs Assistance Bed Mobility: Supine to Sit;Sit to Supine     Supine to sit: Max assist Sit to supine: Max assist        Transfers Overall transfer level:  (pt refuses transfer attempts  due to LE tightness)                      Ambulation/Gait                  Stairs            Wheelchair Mobility    Modified Rankin (Stroke Patients Only)       Balance Overall balance assessment: Needs assistance Sitting-balance support: Bilateral upper extremity supported;Feet supported Sitting balance-Leahy Scale: Poor Sitting balance - Comments: reliant on BUE support of bed Postural control: Posterior lean                                   Pertinent Vitals/Pain Pain Assessment: Faces Faces Pain Scale: Hurts even more Pain Location: BLE, posterior knees Pain Descriptors / Indicators: Tightness Pain Intervention(s): Monitored during session    Home Living Family/patient expects to be discharged to:: Skilled nursing facility Living Arrangements: Spouse/significant other Available Help at Discharge: Family;Available 24 hours/day Type of Home: House Home Access: Ramped entrance Entrance Stairs-Rails: None Entrance Stairs-Number of Steps: 1   Home Layout: Two level;Able to live on main level with bedroom/bathroom Home Equipment: Rolling Walker (2 wheels);Wheelchair - manual;Cane - single point      Prior Function Prior Level of Function : Needs assist       Physical Assist : ADLs (physical);Mobility (physical) Mobility (physical): Gait;Stairs ADLs (physical): Bathing;Dressing;IADLs Mobility Comments: Mod I with stand pivot transfers, w/c mobility at baseline. Able to walk up to ~10 ft with RW before last admission in November. ADLs  Comments: Someone assists in cleaning his back and dressing his lower half., sponge bathes at baseline     Hand Dominance   Dominant Hand: Right    Extremity/Trunk Assessment   Upper Extremity Assessment Upper Extremity Assessment: Generalized weakness    Lower Extremity Assessment Lower Extremity Assessment: Generalized weakness    Cervical / Trunk Assessment Cervical / Trunk  Assessment: Kyphotic  Communication   Communication: No difficulties (mild dysarthria, may be baseline)  Cognition Arousal/Alertness: Awake/alert Behavior During Therapy: WFL for tasks assessed/performed Overall Cognitive Status: No family/caregiver present to determine baseline cognitive functioning Area of Impairment: Memory;Safety/judgement;Awareness;Problem solving                     Memory: Decreased short-term memory Following Commands: Follows one step commands with increased time Safety/Judgement: Decreased awareness of safety;Decreased awareness of deficits Awareness: Intellectual Problem Solving: Slow processing          General Comments General comments (skin integrity, edema, etc.): VSS on RA    Exercises     Assessment/Plan    PT Assessment Patient needs continued PT services  PT Problem List Decreased strength;Decreased activity tolerance;Decreased balance;Decreased mobility;Decreased cognition;Decreased knowledge of use of DME;Decreased knowledge of precautions;Decreased safety awareness;Pain       PT Treatment Interventions DME instruction;Gait training;Functional mobility training;Therapeutic activities;Therapeutic exercise;Neuromuscular re-education;Balance training;Cognitive remediation;Patient/family education;Wheelchair mobility training    PT Goals (Current goals can be found in the Care Plan section)  Acute Rehab PT Goals Patient Stated Goal: to improve strength PT Goal Formulation: With patient Time For Goal Achievement: 04/11/21 Potential to Achieve Goals: Fair Additional Goals Additional Goal #1: Pt will mobilize in manual wheelchair for 100' with supervision    Frequency Min 2X/week   Barriers to discharge        Co-evaluation               AM-PAC PT "6 Clicks" Mobility  Outcome Measure Help needed turning from your back to your side while in a flat bed without using bedrails?: A Lot Help needed moving from lying on your  back to sitting on the side of a flat bed without using bedrails?: A Lot Help needed moving to and from a bed to a chair (including a wheelchair)?: Total Help needed standing up from a chair using your arms (e.g., wheelchair or bedside chair)?: Total Help needed to walk in hospital room?: Total Help needed climbing 3-5 steps with a railing? : Total 6 Click Score: 8    End of Session   Activity Tolerance: Patient tolerated treatment well Patient left: in bed;with call bell/phone within reach;with bed alarm set Nurse Communication: Mobility status PT Visit Diagnosis: Other abnormalities of gait and mobility (R26.89);Muscle weakness (generalized) (M62.81) Pain - part of body: Leg    Time: 6644-0347 PT Time Calculation (min) (ACUTE ONLY): 29 min   Charges:   PT Evaluation $PT Eval Low Complexity: [place], PT, DPT Acute Rehabilitation Pager: [phone] Office [phone]   Zenaida Niece 03/28/2021, 1:50 PM

## 2021-03-28 NOTE — Progress Notes (Signed)
Nursing report given to [facility] nurse stephanie

## 2021-03-28 NOTE — Progress Notes (Signed)
   HD#2 SUBJECTIVE:  Patient Summary: Mr. Chris Reeder. Payne is a 70 year old man with a history of COPD, HFrEF, CAD status post CABG, ruptured AAA status postrepair, CVA, PAD, hypertension and prediabetes who is presenting from [place] with urinary retention and abnormal labs.  Overnight Events: NAEON  Interim History: This morning the patient denies shortness of breath. He is updated on his lab values and plan. All questions answered.   OBJECTIVE:  Vital Signs: Vitals:   03/28/21 0157 03/28/21 0529 03/28/21 0939 03/28/21 1048  BP: 135/80 133/79 130/82   Pulse: 97 98 97 91  Resp: 18 16 20    Temp: 99.4 F (37.4 C) 98.2 F (36.8 C) 98.5 F (36.9 C)   TempSrc: Oral  Oral   SpO2: 95% 97% 99% 98%      Intake/Output Summary (Last 24 hours) at 03/28/2021 1203 Last data filed at 03/28/2021 0735 Gross per 24 hour  Intake 1360.11 ml  Output 1625 ml  Net -264.89 ml    Net IO Since Admission: -3,364.89 mL [03/28/21 1203]  Physical Exam: Physical Exam Vitals reviewed.  Cardiovascular:     Rate and Rhythm: Normal rate and regular rhythm.  Pulmonary:     Effort: Pulmonary effort is normal.     Comments: CTAB Abdominal:     General: Bowel sounds are normal.     Palpations: Abdomen is soft.     Tenderness: There is no abdominal tenderness.  Neurological:     Mental Status: He is alert. He is disoriented.     Patient Lines/Drains/Airways Status     Active Line/Drains/Airways     Name Placement date Placement time Site Days   Peripheral IV 03/26/21 20 G 1" Left;Anterior;Proximal Forearm 03/26/21  1346  Forearm  1   Urethral Catheter Kathlee Nations RN Straight-tip 16 Fr. 03/26/21  1225  Straight-tip  1   Pressure Injury 03/07/20 Sacrum Right Stage 1 -  Intact skin with non-blanchable redness of a localized area usually over a bony prominence. 03/07/20  2003  -- 385             ASSESSMENT/PLAN:  Assessment: Mr. Chris Schewe. Payne is a 70 year old man with a history of  COPD, HFrEF, CAD status post CABG, ruptured AAA status postrepair, CVA, PAD, hypertension and prediabetes who is presenting from [place] with urinary retention and abnormal labs.  Plan: #Urinary retention #AKI #BPH Cr has almost normalized at 1.26. Dx at this point is almost certainly post-renal obstruction. Rate of UOP has slowed to 1.6L/24hrs. Medically stable for D/C, awaiting placement.  -Foley in place -Flomax -Follow Cr with daily BMPs -Outpatient Urology f/u   #LLE shin wound #Pressure injury of the heel No changes, no signs of systemic infection.  -Wound care consult, s/o -Dressing without compression given PAD Hx  #Hyperglycemia Fasting CBG of 184 this AM, up to a high of 240. Required 11U SSI. -Moderate SSI  -Add 5U basal insulin  COPD -Albuterol inhaler PRN -Breo ellipta daily  #Other problems, chronic and stable CAD status post CABG in 2012 with severe three-vessel disease (2018) PAD Carotid stenosis, L>R History of pontine infarct and TIA HFrEF Ischemic cardiomyopathy Hypertension AAA status postrepair in 2011 Degenerative disc disease Chronic pain syndrome   FEN/GI: HH diet VTE: Lovenox Dispo: Likely return to SNF Code Status: FULL   Signature: Corky Sox, MD PGY-1 Pager: [phone]  Please contact the on call pager after 5 pm and on weekends at [phone].

## 2021-03-28 NOTE — TOC Initial Note (Addendum)
Transition of Care [facility]) - Initial/Assessment Note    Patient Details  Name: Chris Payne MRN: [id number] Date of Birth: 12/08/1950  Transition of [facility]) CM/SW Contact:    Milas Gain, [place] Phone Number: 03/28/2021, 3:40 PM  Clinical Narrative:       Update- Patients insurance authorization has been approved reference number [id number]. Approved from 12/2-12/6. Patient has SNF bed at [facility]. CSW informed MD. Update- CSW spoke with Nicki at [name] who confirmed she can accept patient for SNF placement today if we get insurance approval CSW started insurance authorization. Reference number is #   [id number] . CSW informed MD and Toula Moos.    CSW received consult for possible SNF placement at time of discharge. CSW spoke with patients step son   Toula Moos regarding PT recommendation of SNF placement at time of discharge. Patient comes from home with step son. Patients step son Toula Moos expressed understanding of PT recommendation and is agreeable to SNF placement at time of discharge. Toula Moos wants patient to return to [facility] for SNF. . CSW discussed insurance authorization process. No further questions reported at this time. CSW to continue to follow and assist with discharge planning needs.   Expected Discharge Plan: Skilled Nursing Facility Barriers to Discharge: Continued Medical Work up   Patient Goals and CMS Choice   CMS Medicare.gov Compare Post Acute Care list provided to:: Patient Represenative (must comment) (patients step son Toula Moos) Choice offered to / list presented to :  (Patients step son luther)  Expected Discharge Plan and Services Expected Discharge Plan: [place] In-house Referral: Clinical Social Work     Living arrangements for the past 2 months: Single Family Home                                      Prior Living Arrangements/Services Living arrangements for the past 2 months: Single Family Home Lives with:: Self, Relatives  (lives with step son Dentist) Patient language and need for interpreter reviewed:: Yes Do you feel safe going back to the place where you live?: No   SNF  Need for Family Participation in Patient Care: Yes (Comment) Care giver support system in place?: Yes (comment)   Criminal Activity/Legal Involvement Pertinent to Current Situation/Hospitalization: No - Comment as needed  Activities of Daily Living Home Assistive Devices/Equipment: Other (Comment) (from SNF) ADL Screening (condition at time of admission) Patient's cognitive ability adequate to safely complete daily activities?: No Is the patient deaf or have difficulty hearing?: No Does the patient have difficulty seeing, even when wearing glasses/contacts?: No Does the patient have difficulty concentrating, remembering, or making decisions?: No Patient able to express need for assistance with ADLs?: Yes Does the patient have difficulty dressing or bathing?: Yes Independently performs ADLs?: No Does the patient have difficulty walking or climbing stairs?: Yes Weakness of Legs: Both Weakness of Arms/Hands: Both  Permission Sought/Granted Permission sought to share information with : Case Manager, Family Supports, Chartered certified accountant granted to share information with : No  Share Information with NAME: Patient only oriented to self spoke with patients step son Toula Moos  Permission granted to share info w AGENCY: Patient only oriented to self spoke with patients step son Toula Moos SNF  Permission granted to share info w Relationship: Patient only oriented to self spoke with patients step son Toula Moos  Permission granted to share info w Contact Information: Patient only oriented  to self spoke with patients step son Toula Moos [phone]  Emotional Assessment       Orientation: : Oriented to Self Alcohol / Substance Use: Not Applicable Psych Involvement: No (comment)  Admission diagnosis:  Cough [R05.9] Hyperkalemia  [E87.5] Hyponatremia [E87.1] Hyperglycemia [R73.9] General weakness [R53.1] AKI (acute kidney injury) ([place]) [N17.9] Acute urinary retention [R33.8] Acute renal failure, unspecified acute renal failure type ([place]) [N17.9] Skin ulcer of sacrum, unspecified ulcer stage ([place]) [L98.429] Patient Active Problem List   Diagnosis Date Noted   Pressure injury of heel, unstageable ([place]) 03/27/2021   Urinary retention 03/26/2021   Cellulitis of left lower extremity    Swelling of left foot    Effusion of left knee    Acute hypoxemic respiratory failure due to COVID-19 ([place]) 03/09/2021   AKI (acute kidney injury) ([place]) 03/09/2021   Shortness of breath 03/08/2021   HFrEF (heart failure with reduced ejection fraction) ([place]) 03/08/2021   Unstageable pressure ulcer of sacral region ([place]) 03/08/2020   Candida rash of groin 10/19/2018   Acute on chronic systolic heart failure ([place]) 10/17/2018   Acute on chronic systolic (congestive) heart failure ([place]) 10/17/2018   COPD exacerbation ([place]) 05/21/2018   Chronic pain syndrome 07/19/2015   Degenerative disc disease, lumbar 07/19/2015   Weakness 04/17/2015   Generalized weakness 04/17/2015   Polycythemia 04/17/2015   BPH (benign prostatic hyperplasia) 04/17/2015   Seizure disorder ([place]) 10/12/2014   TIA (transient ischemic attack) 08/23/2013   Other and unspecified angina pectoris 07/04/2013   Nontoxic multinodular goiter 10/07/2012   Unstable angina (HCC) 06/19/2012   Carotid stenosis    PAD (peripheral artery disease) (HCC)    Left inguinal hernia 05/30/2012   COPD (chronic obstructive pulmonary disease) ([place]) 08/24/2011   S/P AAA repair 08/24/2011   Lumbar disc disease    Hypertensive heart disease    Hyperlipidemia    CAD (coronary artery disease)    History of pontine CVA    GERD (gastroesophageal reflux disease)    PCP:  Benito Mccreedy, MD Pharmacy:   [place], [place] - [address] [place] [address] Phone: [phone] Fax: [phone]  [facility] [address] [place] [address] Phone: [phone] Fax: [phone]     Social Determinants of Health (SDOH) Interventions    Readmission Risk Interventions No flowsheet data found.

## 2021-03-28 NOTE — Consult Note (Signed)
[facility] CM Inpatient Consult   03/28/2021  Chris Payne 03/21/1951 [id number]  [facility] [[facility]]  Accountable Care Organization [ACO] Patient: [employer] Medicare  Primary Care Provider:  Osei-Bonsu, George, MD [facility]    Patient screened for less than 30 days readmission hospitalization with noted high risk score for unplanned readmission risk and to assess for potential [facility]  [[facility]] Care Management service needs for post hospital transition.  Review of patient's medical record reveals patient is being recommended to continue post hospital skilled nursing facility for rehab. Patient with a past history with [facility] Care Management. Met with the patient at the bedside.  Patient oriented to name however, states, "I am wanting to go to [facility] for treatment, I feel so bad." Patient could not give history of last facility for rehab.  Plan:  Continue to follow progress and disposition to assess for post hospital care management needs.    For questions contact:    , RN BSN CCM [facility] Hospital Liaison  [phone] business mobile phone Toll free office [phone]  Fax number: [phone] [email or web address]     

## 2021-03-28 NOTE — Discharge Summary (Addendum)
Name: Chris Payne MRN: [id number] DOB: Jul 17, 1950 70 y.o. PCP: Benito Mccreedy, MD  Date of Admission: 03/25/2021  5:35 PM Date of Discharge: 03/28/2021 Attending Physician: Lucious Groves, DO  Discharge Diagnosis:  1. BPH, urinary retention 2. AKI 3. Hyperglycemia, prediabetes 4. LLE wound 5. Pressure injury of the heel 6. COPD 7. CAD status post CABG in 2012 with severe three-vessel disease (2018) 8. PAD 9. Carotid stenosis, L>R 10. History of pontine infarct and TIA 11. HFrEF 12. Ischemic cardiomyopathy 13. Hypertension 14. AAA status postrepair in 2011 15. Degenerative disc disease 16. Chronic pain syndrome  Discharge Medications: Allergies as of 03/28/2021   No Known Allergies      Medication List     STOP taking these medications    traMADol 50 MG tablet Commonly known as: ULTRAM   triamcinolone cream 0.1 % Commonly known as: KENALOG       TAKE these medications    acetaminophen 325 MG tablet Commonly known as: TYLENOL Take 650 mg by mouth every 6 (six) hours as needed for mild pain.   albuterol 108 (90 Base) MCG/ACT inhaler Commonly known as: VENTOLIN HFA Inhale 2 puffs into the lungs every 6 (six) hours as needed for wheezing or shortness of breath.   aspirin 81 MG EC tablet Take 1 tablet (81 mg total) by mouth daily.   budesonide-formoterol 160-4.5 MCG/ACT inhaler Commonly known as: Symbicort Inhale 2 puffs into the lungs 2 (two) times daily.   feeding supplement (PRO-STAT SUGAR FREE 64) Liqd Take 30 mLs by mouth 2 (two) times daily. Wound health   furosemide 20 MG tablet Commonly known as: LASIX Take 1 tablet (20 mg total) by mouth daily. What changed:  when to take this additional instructions   gabapentin 300 MG capsule Commonly known as: NEURONTIN Take 300 mg by mouth 2 (two) times daily.   metoprolol tartrate 25 MG tablet Commonly known as: LOPRESSOR Take 1 tablet (25 mg total) by mouth 2 (two) times daily.    pantoprazole 40 MG tablet Commonly known as: PROTONIX Take 40 mg by mouth daily.   rosuvastatin 40 MG tablet Commonly known as: CRESTOR Take 1 tablet (40 mg total) by mouth daily.   tamsulosin 0.4 MG Caps capsule Commonly known as: FLOMAX Take 1 capsule (0.4 mg total) by mouth daily. Start taking on: March 29, 2021               Discharge Care Instructions  (From admission, onward)           Start     Ordered   03/28/21 0000  Discharge wound care:       Comments: Per nursing   03/28/21 1629            Disposition and follow-up:   Mr.Chris Payne was discharged from [facility] in Stable condition.  At the hospital follow up visit please address:  1.  Ensure eventual voiding trial with foley catheter and Urology follow up. Consider addition of finasteride. Monitor A1C for potential T2DM. Likely needs metformin.   2.  Labs / imaging needed at time of follow-up: NA  3.  Pending labs/ test needing follow-up: BMP to monitor [place] by problem list: BPH, urinary retention, AKI Patient presented with difficulty urinating and a Cr of 4.35. At previous hospitalization he required a foley catheter, but had a successful voiding trial before discharge. On this admission, he was found to have a severely distended bladder  and moderate prostatic enlargement on CT A/P. Foley catheter relieved the obstruction and the patient urinated approximately 5 liters over the course of several hours. On the day of discharge his Cr was 1.26. It was strongly felt that his AKI was due to post-renal causes. He was started on Flomax and discharged on this medication with a foley catheter in place. Outpatient Urology f/u is recommended.  Hyperglycemia, prediabetes Patient was recently hospitalized for COPD exacerbation and received steroids for several days. He has a history of prediabetes, but on this admission his A1C was found to be 8.2 and he had a CBG of  500. It is thought that his hyperglycemia was partly due to the steroid use and he was therefore not given the diagnosis of T2DM. He would likely benefit from metformin.  LLE shin wound Patient was treated for cellulitis of the LLE at his recent hospitalization with Keflex. He fell after that time and had a large blister that has healed poorly. Patient has PAD. On our exam he has a healing wound as shown below. This was not treated with any ABX.   HFrEF Ischemic cardiomyopathy Last echocardiogram 03/08/2021 showed an EF 55 to 60% with mild left ventricular hypertrophy, grade 1 diastolic dysfunction and normal RV systolic function.     CAD status post CABG in 2012 with severe three-vessel disease (2018) PAD Carotid stenosis, L>R History of pontine infarct and TIA Stress test performed in 2020 showing moderate to large sized inferior/lateral transmural scar with peri-infarct ischemia and akinesis. His home meds include ASA 81 mg daily and rosuvastatin 40 mg daily.    Hypertension AAA status postrepair in 2011 Continued home Lopressor  Sacral ulcer Noted at last hospitalization. Given foam pad to offset.    Recent COVID infection COPD Recently admitted from 11/11-11/17 for acute hypoxic respiratory failure in the setting of COVID-19 and COPD exacerbation.  Home medications include Symbicort and albuterol, continue inpatient alternative Breo Ellipta and albuterol as needed. Of note, no PFTs on file.   Degenerative disc disease Chronic pain syndrome Continued home gabapentin after AKI resolved.   GERD Daily PPI  Discharge Exam:   BP 130/82 (BP Location: Right Arm)   Pulse 91   Temp 98.5 F (36.9 C) (Oral)   Resp 20   SpO2 98%  Discharge exam:  Vitals reviewed.  Cardiovascular:     Rate and Rhythm: Normal rate and regular rhythm.  Pulmonary:     Effort: Pulmonary effort is normal.     Comments: CTAB Abdominal:     General: Bowel sounds are normal.     Palpations: Abdomen is  soft.     Tenderness: There is no abdominal tenderness.  Neurological:     Mental Status: He is alert. He is disoriented.       Pertinent Labs, Studies, and Procedures:  BMP, CT A/P, foley catheter placed  Discharge Instructions: Discharge Instructions     Diet - low sodium heart healthy   Complete by: As directed    Discharge wound care:   Complete by: As directed    Per nursing   Increase activity slowly   Complete by: As directed        Signed: Corky Sox, MD PGY-1

## 2021-03-28 NOTE — NC FL2 (Signed)
[place] LEVEL OF CARE SCREENING TOOL     IDENTIFICATION  Patient Name: Chris Payne Birthdate: 04-17-1951 Sex: male Admission Date (Current Location): 03/25/2021  [facility] and [place] Number:  Herbalist and Address:  [facility] [place] [address] [place], [place] [address]      Provider Number: [id number]  Attending Physician Name and Address:  Lucious Groves, DO  Relative Name and Phone Number:  Powers,Luther son-in-law [phone]    Current Level of Care: Hospital Recommended Level of Care: [place] Prior Approval Number:    Date Approved/Denied:   PASRR Number: [id number]  Discharge Plan: SNF    Current Diagnoses: Patient Active Problem List   Diagnosis Date Noted   Pressure injury of heel, unstageable ([place]) 03/27/2021   Urinary retention 03/26/2021   Cellulitis of left lower extremity    Swelling of left foot    Effusion of left knee    Acute hypoxemic respiratory failure due to COVID-19 [facility]) 03/09/2021   AKI (acute kidney injury) ([place]) 03/09/2021   Shortness of breath 03/08/2021   HFrEF (heart failure with reduced ejection fraction) ([place]) 03/08/2021   Unstageable pressure ulcer of sacral region ([place]) 03/08/2020   Candida rash of groin 10/19/2018   Acute on chronic systolic heart failure ([place]) 10/17/2018   Acute on chronic systolic (congestive) heart failure ([place]) 10/17/2018   COPD exacerbation ([place]) 05/21/2018   Chronic pain syndrome 07/19/2015   Degenerative disc disease, lumbar 07/19/2015   Weakness 04/17/2015   Generalized weakness 04/17/2015   Polycythemia 04/17/2015   BPH (benign prostatic hyperplasia) 04/17/2015   Seizure disorder ([place]) 10/12/2014   TIA (transient ischemic attack) 08/23/2013   Other and unspecified angina pectoris 07/04/2013   Nontoxic multinodular goiter 10/07/2012   Unstable angina ([place]) 06/19/2012   Carotid stenosis    PAD (peripheral artery disease) (HCC)     Left inguinal hernia 05/30/2012   COPD (chronic obstructive pulmonary disease) ([place]) 08/24/2011   S/P AAA repair 08/24/2011   Lumbar disc disease    Hypertensive heart disease    Hyperlipidemia    CAD (coronary artery disease)    History of pontine CVA    GERD (gastroesophageal reflux disease)     Orientation RESPIRATION BLADDER Height & Weight     Self, Time, Situation, Place  Normal Indwelling catheter Weight:   Height:     BEHAVIORAL SYMPTOMS/MOOD NEUROLOGICAL BOWEL NUTRITION STATUS      Continent Diet  AMBULATORY STATUS COMMUNICATION OF NEEDS Skin   Extensive Assist Verbally PU Stage and Appropriate Care (Multiple skin tears, largest is the left shin, linear on the anterior area, bilateral small circular skin tears over the knees, small skin tears on bilateral forearms.Pressure Injury 03/27/21 Sacrum Unstageable - Full thickness tissue loss)                       Personal Care Assistance Level of Assistance  Bathing, Feeding, Dressing Bathing Assistance: Limited assistance Feeding assistance: Independent Dressing Assistance: Limited assistance     Functional Limitations Info  Sight, Hearing, Speech Sight Info: Adequate Hearing Info: Adequate Speech Info: Adequate    SPECIAL CARE FACTORS FREQUENCY  PT (By licensed PT), OT (By licensed OT)     PT Frequency: 5x/week OT Frequency: 5x/week            Contractures Contractures Info: Not present    Additional Factors Info  Code Status, Allergies Code Status Info: Full Allergies Info:  No known Allergies           Current Medications (03/28/2021):  This is the current hospital active medication list Current Facility-Administered Medications  Medication Dose Route Frequency Provider Last Rate Last Admin   acetaminophen (TYLENOL) tablet 650 mg  650 mg Oral Q6H PRN Rehman, Areeg N, DO   650 mg at 03/27/21 0243   Or   acetaminophen (TYLENOL) suppository 650 mg  650 mg Rectal Q6H PRN Rehman, Areeg N, DO        albuterol (PROVENTIL) (2.5 MG/3ML) 0.083% nebulizer solution 2.5 mg  2.5 mg Inhalation Q6H PRN Rehman, Areeg N, DO       aspirin EC tablet 81 mg  81 mg Oral Daily Rehman, Areeg N, DO   81 mg at 03/28/21 0956   Chlorhexidine Gluconate Cloth 2 % PADS 6 each  6 each Topical Daily Joni Reining C, DO   6 each at 03/28/21 0943   enoxaparin (LOVENOX) injection 30 mg  30 mg Subcutaneous Q24H Rehman, Areeg N, DO   30 mg at 03/27/21 1545   fluticasone furoate-vilanterol (BREO ELLIPTA) 200-25 MCG/ACT 1 puff  1 puff Inhalation Daily Rehman, Areeg N, DO   1 puff at 03/28/21 0748   gabapentin (NEURONTIN) capsule 300 mg  300 mg Oral BID Virl Axe, MD   300 mg at 03/28/21 1049   insulin aspart (novoLOG) injection 0-15 Units  0-15 Units Subcutaneous TID WC Rehman, Areeg N, DO   2 Units at 03/28/21 1134   insulin glargine-yfgn (SEMGLEE) injection 5 Units  5 Units Subcutaneous Daily Corky Sox, MD       metoprolol tartrate (LOPRESSOR) tablet 25 mg  25 mg Oral BID Virl Axe, MD   25 mg at 03/28/21 1049   pantoprazole (PROTONIX) EC tablet 40 mg  40 mg Oral Daily Rehman, Areeg N, DO   40 mg at 03/28/21 0956   potassium chloride (KLOR-CON) packet 40 mEq  40 mEq Oral Once Virl Axe, MD       rosuvastatin (CRESTOR) tablet 40 mg  40 mg Oral Daily Rehman, Areeg N, DO   40 mg at 03/28/21 0956   sodium hypochlorite (DAKIN'S 1/4 STRENGTH) topical solution   Irrigation BID Lucious Groves, DO   Given at 03/28/21 1316   tamsulosin (FLOMAX) capsule 0.4 mg  0.4 mg Oral Daily Rehman, Areeg N, DO   0.4 mg at 03/28/21 0956   triamcinolone cream (KENALOG) 0.1 % cream 1 application  1 application Topical BID PRN Rehman, Areeg N, DO         Discharge Medications: Please see discharge summary for a list of discharge medications.  Relevant Imaging Results:  Relevant Lab Results:   Additional Information JYN:[id number]  [employer], LCSW

## 2021-03-29 DIAGNOSIS — I69828 Other speech and language deficits following other cerebrovascular disease: Secondary | ICD-10-CM | POA: Diagnosis not present

## 2021-03-29 DIAGNOSIS — R262 Difficulty in walking, not elsewhere classified: Secondary | ICD-10-CM | POA: Diagnosis not present

## 2021-03-29 DIAGNOSIS — S81801D Unspecified open wound, right lower leg, subsequent encounter: Secondary | ICD-10-CM | POA: Diagnosis not present

## 2021-03-29 DIAGNOSIS — D649 Anemia, unspecified: Secondary | ICD-10-CM | POA: Diagnosis not present

## 2021-03-29 DIAGNOSIS — I739 Peripheral vascular disease, unspecified: Secondary | ICD-10-CM | POA: Diagnosis not present

## 2021-03-29 DIAGNOSIS — L89152 Pressure ulcer of sacral region, stage 2: Secondary | ICD-10-CM | POA: Diagnosis not present

## 2021-03-29 DIAGNOSIS — R2689 Other abnormalities of gait and mobility: Secondary | ICD-10-CM | POA: Diagnosis not present

## 2021-03-29 DIAGNOSIS — R6889 Other general symptoms and signs: Secondary | ICD-10-CM | POA: Diagnosis not present

## 2021-03-29 DIAGNOSIS — S81802A Unspecified open wound, left lower leg, initial encounter: Secondary | ICD-10-CM | POA: Diagnosis not present

## 2021-03-29 DIAGNOSIS — R5381 Other malaise: Secondary | ICD-10-CM | POA: Diagnosis not present

## 2021-03-29 DIAGNOSIS — J441 Chronic obstructive pulmonary disease with (acute) exacerbation: Secondary | ICD-10-CM | POA: Diagnosis not present

## 2021-03-29 DIAGNOSIS — L97812 Non-pressure chronic ulcer of other part of right lower leg with fat layer exposed: Secondary | ICD-10-CM | POA: Diagnosis not present

## 2021-03-29 DIAGNOSIS — Z978 Presence of other specified devices: Secondary | ICD-10-CM | POA: Diagnosis not present

## 2021-03-29 DIAGNOSIS — L03116 Cellulitis of left lower limb: Secondary | ICD-10-CM | POA: Diagnosis not present

## 2021-03-29 DIAGNOSIS — Z7401 Bed confinement status: Secondary | ICD-10-CM | POA: Diagnosis not present

## 2021-03-29 DIAGNOSIS — N179 Acute kidney failure, unspecified: Secondary | ICD-10-CM | POA: Diagnosis not present

## 2021-03-29 DIAGNOSIS — L97822 Non-pressure chronic ulcer of other part of left lower leg with fat layer exposed: Secondary | ICD-10-CM | POA: Diagnosis not present

## 2021-03-29 DIAGNOSIS — S81802D Unspecified open wound, left lower leg, subsequent encounter: Secondary | ICD-10-CM | POA: Diagnosis not present

## 2021-03-29 DIAGNOSIS — I10 Essential (primary) hypertension: Secondary | ICD-10-CM | POA: Diagnosis not present

## 2021-03-29 DIAGNOSIS — M6281 Muscle weakness (generalized): Secondary | ICD-10-CM | POA: Diagnosis not present

## 2021-03-29 DIAGNOSIS — R2681 Unsteadiness on feet: Secondary | ICD-10-CM | POA: Diagnosis not present

## 2021-03-29 DIAGNOSIS — Z8673 Personal history of transient ischemic attack (TIA), and cerebral infarction without residual deficits: Secondary | ICD-10-CM | POA: Diagnosis not present

## 2021-03-29 DIAGNOSIS — L89613 Pressure ulcer of right heel, stage 3: Secondary | ICD-10-CM | POA: Diagnosis not present

## 2021-03-29 DIAGNOSIS — L89150 Pressure ulcer of sacral region, unstageable: Secondary | ICD-10-CM | POA: Diagnosis not present

## 2021-03-29 DIAGNOSIS — I5022 Chronic systolic (congestive) heart failure: Secondary | ICD-10-CM | POA: Diagnosis not present

## 2021-03-29 DIAGNOSIS — L89623 Pressure ulcer of left heel, stage 3: Secondary | ICD-10-CM | POA: Diagnosis not present

## 2021-03-29 DIAGNOSIS — J449 Chronic obstructive pulmonary disease, unspecified: Secondary | ICD-10-CM | POA: Diagnosis not present

## 2021-03-29 DIAGNOSIS — S81801A Unspecified open wound, right lower leg, initial encounter: Secondary | ICD-10-CM | POA: Diagnosis not present

## 2021-03-29 DIAGNOSIS — I69398 Other sequelae of cerebral infarction: Secondary | ICD-10-CM | POA: Diagnosis not present

## 2021-03-29 DIAGNOSIS — N390 Urinary tract infection, site not specified: Secondary | ICD-10-CM | POA: Diagnosis not present

## 2021-03-29 DIAGNOSIS — R1312 Dysphagia, oropharyngeal phase: Secondary | ICD-10-CM | POA: Diagnosis not present

## 2021-03-29 NOTE — Progress Notes (Signed)
[facility] arrived to transport pt. to [facility].Pt was able to discharge without incident. Pt PIV removed, pt. paperwork given to PTAR.

## 2021-03-31 DIAGNOSIS — D649 Anemia, unspecified: Secondary | ICD-10-CM | POA: Diagnosis not present

## 2021-03-31 DIAGNOSIS — R5381 Other malaise: Secondary | ICD-10-CM | POA: Diagnosis not present

## 2021-03-31 DIAGNOSIS — J449 Chronic obstructive pulmonary disease, unspecified: Secondary | ICD-10-CM | POA: Diagnosis not present

## 2021-03-31 DIAGNOSIS — I5022 Chronic systolic (congestive) heart failure: Secondary | ICD-10-CM | POA: Diagnosis not present

## 2021-04-01 LAB — CULTURE, BLOOD (ROUTINE X 2)
Culture: NO GROWTH
Special Requests: ADEQUATE

## 2021-04-02 LAB — CULTURE, BLOOD (ROUTINE X 2)
Culture: NO GROWTH
Special Requests: ADEQUATE

## 2021-04-07 DIAGNOSIS — L89623 Pressure ulcer of left heel, stage 3: Secondary | ICD-10-CM | POA: Diagnosis not present

## 2021-04-07 DIAGNOSIS — S81801A Unspecified open wound, right lower leg, initial encounter: Secondary | ICD-10-CM | POA: Diagnosis not present

## 2021-04-07 DIAGNOSIS — S81802A Unspecified open wound, left lower leg, initial encounter: Secondary | ICD-10-CM | POA: Diagnosis not present

## 2021-04-07 DIAGNOSIS — L89150 Pressure ulcer of sacral region, unstageable: Secondary | ICD-10-CM | POA: Diagnosis not present

## 2021-04-11 DIAGNOSIS — R5381 Other malaise: Secondary | ICD-10-CM | POA: Diagnosis not present

## 2021-04-11 DIAGNOSIS — Z8673 Personal history of transient ischemic attack (TIA), and cerebral infarction without residual deficits: Secondary | ICD-10-CM | POA: Diagnosis not present

## 2021-04-11 DIAGNOSIS — Z978 Presence of other specified devices: Secondary | ICD-10-CM | POA: Diagnosis not present

## 2021-04-17 DIAGNOSIS — N390 Urinary tract infection, site not specified: Secondary | ICD-10-CM | POA: Diagnosis not present

## 2021-04-18 DIAGNOSIS — N390 Urinary tract infection, site not specified: Secondary | ICD-10-CM | POA: Diagnosis not present

## 2021-04-24 DIAGNOSIS — L89152 Pressure ulcer of sacral region, stage 2: Secondary | ICD-10-CM | POA: Diagnosis not present

## 2021-04-24 DIAGNOSIS — J449 Chronic obstructive pulmonary disease, unspecified: Secondary | ICD-10-CM | POA: Diagnosis not present

## 2021-04-24 DIAGNOSIS — I739 Peripheral vascular disease, unspecified: Secondary | ICD-10-CM | POA: Diagnosis not present

## 2021-04-24 DIAGNOSIS — I5022 Chronic systolic (congestive) heart failure: Secondary | ICD-10-CM | POA: Diagnosis not present

## 2021-04-27 ENCOUNTER — Observation Stay (HOSPITAL_COMMUNITY): Payer: Medicare HMO

## 2021-04-27 ENCOUNTER — Emergency Department (HOSPITAL_COMMUNITY): Payer: Medicare HMO

## 2021-04-27 ENCOUNTER — Inpatient Hospital Stay (HOSPITAL_COMMUNITY)
Admission: EM | Admit: 2021-04-27 | Discharge: 2021-05-04 | DRG: 698 | Disposition: A | Discharge status: Home Health | Payer: Medicare HMO | Attending: Internal Medicine | Admitting: Internal Medicine

## 2021-04-27 ENCOUNTER — Encounter (HOSPITAL_COMMUNITY): Payer: Self-pay | Admitting: Emergency Medicine

## 2021-04-27 ENCOUNTER — Other Ambulatory Visit: Payer: Self-pay

## 2021-04-27 DIAGNOSIS — R338 Other retention of urine: Secondary | ICD-10-CM | POA: Diagnosis present

## 2021-04-27 DIAGNOSIS — G894 Chronic pain syndrome: Secondary | ICD-10-CM | POA: Diagnosis present

## 2021-04-27 DIAGNOSIS — R6889 Other general symptoms and signs: Secondary | ICD-10-CM | POA: Diagnosis not present

## 2021-04-27 DIAGNOSIS — Z8679 Personal history of other diseases of the circulatory system: Secondary | ICD-10-CM

## 2021-04-27 DIAGNOSIS — R310 Gross hematuria: Secondary | ICD-10-CM | POA: Diagnosis present

## 2021-04-27 DIAGNOSIS — L89626 Pressure-induced deep tissue damage of left heel: Secondary | ICD-10-CM | POA: Diagnosis not present

## 2021-04-27 DIAGNOSIS — L03317 Cellulitis of buttock: Secondary | ICD-10-CM

## 2021-04-27 DIAGNOSIS — Z7982 Long term (current) use of aspirin: Secondary | ICD-10-CM

## 2021-04-27 DIAGNOSIS — L03818 Cellulitis of other sites: Secondary | ICD-10-CM | POA: Diagnosis present

## 2021-04-27 DIAGNOSIS — N390 Urinary tract infection, site not specified: Secondary | ICD-10-CM | POA: Diagnosis present

## 2021-04-27 DIAGNOSIS — I2510 Atherosclerotic heart disease of native coronary artery without angina pectoris: Secondary | ICD-10-CM | POA: Diagnosis present

## 2021-04-27 DIAGNOSIS — K828 Other specified diseases of gallbladder: Secondary | ICD-10-CM | POA: Diagnosis not present

## 2021-04-27 DIAGNOSIS — Z951 Presence of aortocoronary bypass graft: Secondary | ICD-10-CM

## 2021-04-27 DIAGNOSIS — Z8673 Personal history of transient ischemic attack (TIA), and cerebral infarction without residual deficits: Secondary | ICD-10-CM | POA: Diagnosis not present

## 2021-04-27 DIAGNOSIS — R0902 Hypoxemia: Secondary | ICD-10-CM | POA: Diagnosis not present

## 2021-04-27 DIAGNOSIS — J449 Chronic obstructive pulmonary disease, unspecified: Secondary | ICD-10-CM | POA: Diagnosis present

## 2021-04-27 DIAGNOSIS — Z8616 Personal history of COVID-19: Secondary | ICD-10-CM | POA: Diagnosis not present

## 2021-04-27 DIAGNOSIS — L89150 Pressure ulcer of sacral region, unstageable: Secondary | ICD-10-CM | POA: Diagnosis not present

## 2021-04-27 DIAGNOSIS — I5020 Unspecified systolic (congestive) heart failure: Secondary | ICD-10-CM | POA: Diagnosis present

## 2021-04-27 DIAGNOSIS — R319 Hematuria, unspecified: Secondary | ICD-10-CM

## 2021-04-27 DIAGNOSIS — Z7401 Bed confinement status: Secondary | ICD-10-CM | POA: Diagnosis not present

## 2021-04-27 DIAGNOSIS — I110 Hypertensive heart disease with heart failure: Secondary | ICD-10-CM | POA: Diagnosis present

## 2021-04-27 DIAGNOSIS — I739 Peripheral vascular disease, unspecified: Secondary | ICD-10-CM | POA: Diagnosis present

## 2021-04-27 DIAGNOSIS — K219 Gastro-esophageal reflux disease without esophagitis: Secondary | ICD-10-CM | POA: Diagnosis present

## 2021-04-27 DIAGNOSIS — Z79899 Other long term (current) drug therapy: Secondary | ICD-10-CM

## 2021-04-27 DIAGNOSIS — L89151 Pressure ulcer of sacral region, stage 1: Secondary | ICD-10-CM | POA: Diagnosis not present

## 2021-04-27 DIAGNOSIS — U071 COVID-19: Secondary | ICD-10-CM | POA: Diagnosis not present

## 2021-04-27 DIAGNOSIS — Z7951 Long term (current) use of inhaled steroids: Secondary | ICD-10-CM

## 2021-04-27 DIAGNOSIS — K7689 Other specified diseases of liver: Secondary | ICD-10-CM | POA: Diagnosis not present

## 2021-04-27 DIAGNOSIS — E1165 Type 2 diabetes mellitus with hyperglycemia: Secondary | ICD-10-CM | POA: Insufficient documentation

## 2021-04-27 DIAGNOSIS — I5032 Chronic diastolic (congestive) heart failure: Secondary | ICD-10-CM | POA: Diagnosis not present

## 2021-04-27 DIAGNOSIS — N401 Enlarged prostate with lower urinary tract symptoms: Secondary | ICD-10-CM | POA: Diagnosis present

## 2021-04-27 DIAGNOSIS — E876 Hypokalemia: Secondary | ICD-10-CM | POA: Diagnosis present

## 2021-04-27 DIAGNOSIS — Y846 Urinary catheterization as the cause of abnormal reaction of the patient, or of later complication, without mention of misadventure at the time of the procedure: Secondary | ICD-10-CM | POA: Diagnosis present

## 2021-04-27 DIAGNOSIS — T83511A Infection and inflammatory reaction due to indwelling urethral catheter, initial encounter: Secondary | ICD-10-CM | POA: Diagnosis not present

## 2021-04-27 DIAGNOSIS — E1142 Type 2 diabetes mellitus with diabetic polyneuropathy: Secondary | ICD-10-CM | POA: Insufficient documentation

## 2021-04-27 DIAGNOSIS — Z743 Need for continuous supervision: Secondary | ICD-10-CM | POA: Diagnosis not present

## 2021-04-27 DIAGNOSIS — A419 Sepsis, unspecified organism: Secondary | ICD-10-CM

## 2021-04-27 DIAGNOSIS — R339 Retention of urine, unspecified: Secondary | ICD-10-CM | POA: Diagnosis present

## 2021-04-27 DIAGNOSIS — Z87891 Personal history of nicotine dependence: Secondary | ICD-10-CM

## 2021-04-27 DIAGNOSIS — L0390 Cellulitis, unspecified: Secondary | ICD-10-CM

## 2021-04-27 DIAGNOSIS — I252 Old myocardial infarction: Secondary | ICD-10-CM | POA: Diagnosis not present

## 2021-04-27 DIAGNOSIS — K5900 Constipation, unspecified: Secondary | ICD-10-CM | POA: Insufficient documentation

## 2021-04-27 DIAGNOSIS — E785 Hyperlipidemia, unspecified: Secondary | ICD-10-CM | POA: Diagnosis present

## 2021-04-27 DIAGNOSIS — I517 Cardiomegaly: Secondary | ICD-10-CM | POA: Diagnosis not present

## 2021-04-27 DIAGNOSIS — Z9889 Other specified postprocedural states: Secondary | ICD-10-CM | POA: Diagnosis not present

## 2021-04-27 DIAGNOSIS — T82858A Stenosis of vascular prosthetic devices, implants and grafts, initial encounter: Secondary | ICD-10-CM | POA: Diagnosis not present

## 2021-04-27 DIAGNOSIS — R531 Weakness: Secondary | ICD-10-CM | POA: Diagnosis not present

## 2021-04-27 LAB — URINALYSIS, ROUTINE W REFLEX MICROSCOPIC
Bilirubin Urine: NEGATIVE
Glucose, UA: NEGATIVE mg/dL
Ketones, ur: NEGATIVE mg/dL
Nitrite: NEGATIVE
Protein, ur: 100 mg/dL — AB
RBC / HPF: >50 RBC/hpf — ABNORMAL HIGH (ref 0–5)
RBC / HPF: >50 RBC/hpf — ABNORMAL HIGH (ref 0–5)
Specific Gravity, Urine: 1.02 (ref 1.005–1.030)
Specific Gravity, Urine: 1.016 (ref 1.005–1.030)
pH: 6.5 (ref 5.0–8.0)
pH: 6 (ref 5.0–8.0)

## 2021-04-27 LAB — CBC WITH DIFFERENTIAL/PLATELET
Abs Immature Granulocytes: 0.06 10*3/uL (ref 0.00–0.07)
Basophils Absolute: 0 10*3/uL (ref 0.0–0.1)
Basophils Relative: 0 %
Eosinophils Absolute: 0 10*3/uL (ref 0.0–0.5)
Eosinophils Relative: 0 %
HCT: 37.8 % — ABNORMAL LOW (ref 39.0–52.0)
Hemoglobin: 11.9 g/dL — ABNORMAL LOW (ref 13.0–17.0)
Immature Granulocytes: 0 %
Lymphocytes Relative: 12 %
Lymphs Abs: 1.7 10*3/uL (ref 0.7–4.0)
MCH: 33.6 pg (ref 26.0–34.0)
MCHC: 31.5 g/dL (ref 30.0–36.0)
MCV: 106.8 fL — ABNORMAL HIGH (ref 80.0–100.0)
Monocytes Absolute: 1.2 10*3/uL — ABNORMAL HIGH (ref 0.1–1.0)
Monocytes Relative: 9 %
Neutro Abs: 11 10*3/uL — ABNORMAL HIGH (ref 1.7–7.7)
Neutrophils Relative %: 79 %
Platelets: 283 10*3/uL (ref 150–400)
RBC: 3.54 MIL/uL — ABNORMAL LOW (ref 4.22–5.81)
RDW: 15.2 % (ref 11.5–15.5)
WBC: 14 10*3/uL — ABNORMAL HIGH (ref 4.0–10.5)
nRBC: 0 % (ref 0.0–0.2)

## 2021-04-27 LAB — COMPREHENSIVE METABOLIC PANEL
ALT: 27 U/L (ref 0–44)
AST: 29 U/L (ref 15–41)
Albumin: 2.2 g/dL — ABNORMAL LOW (ref 3.5–5.0)
Alkaline Phosphatase: 108 U/L (ref 38–126)
Anion gap: 5 (ref 5–15)
BUN: 28 mg/dL — ABNORMAL HIGH (ref 8–23)
CO2: 30 mmol/L (ref 22–32)
Calcium: 8.4 mg/dL — ABNORMAL LOW (ref 8.9–10.3)
Chloride: 99 mmol/L (ref 98–111)
Creatinine, Ser: 0.58 mg/dL — ABNORMAL LOW (ref 0.61–1.24)
GFR, Estimated: >60 mL/min (ref >60)
Glucose, Bld: 147 mg/dL — ABNORMAL HIGH (ref 70–99)
Potassium: 3.9 mmol/L (ref 3.5–5.1)
Sodium: 134 mmol/L — ABNORMAL LOW (ref 135–145)
Total Bilirubin: 0.6 mg/dL (ref 0.3–1.2)
Total Protein: 6.8 g/dL (ref 6.5–8.1)

## 2021-04-27 LAB — RESP PANEL BY RT-PCR (FLU A&B, COVID) ARPGX2
Influenza A by PCR: NEGATIVE
Influenza B by PCR: NEGATIVE
SARS Coronavirus 2 by RT PCR: POSITIVE — AB

## 2021-04-27 LAB — TYPE AND SCREEN
ABO/RH(D): A POS
Antibody Screen: NEGATIVE

## 2021-04-27 MED ORDER — SODIUM CHLORIDE 0.9 % IV SOLN: 1.0000 g | INTRAVENOUS | Status: DC

## 2021-04-27 MED ORDER — IOHEXOL 350 MG/ML SOLN
80.0000 mL | Freq: Once | INTRAVENOUS | Status: AC | PRN
  Administered 2021-04-27: 80 mL via INTRAVENOUS

## 2021-04-27 MED ORDER — VANCOMYCIN HCL IN DEXTROSE 1-5 GM/200ML-% IV SOLN
1000.0000 mg | Freq: Once | INTRAVENOUS | Status: AC
  Administered 2021-04-27: 1000 mg via INTRAVENOUS
  Filled 2021-04-27: qty 200

## 2021-04-27 MED ORDER — SODIUM CHLORIDE 0.9 % IV BOLUS
500.0000 mL | Freq: Once | INTRAVENOUS | Status: AC
Start: 2021-04-27 — End: 2021-04-27
  Administered 2021-04-27: 500 mL via INTRAVENOUS

## 2021-04-27 MED ORDER — SODIUM CHLORIDE 0.9 % IV SOLN
2.0000 g | Freq: Once | INTRAVENOUS | Status: AC
  Administered 2021-04-27: 2 g via INTRAVENOUS
  Filled 2021-04-27: qty 20

## 2021-04-27 NOTE — ED Triage Notes (Addendum)
Patient presents from home with complaints of hematuria. He has an indwelling catheter. He also complains of general body aches and rectal pain  EMS vitals: 106/72 BP 92 HR 24 RR 96% SPO2 on room air 196 CBG 15 GCS  30 CO2

## 2021-04-27 NOTE — ED Provider Notes (Signed)
[facility] DEPT Provider Note   CSN: [id number] Arrival date & time: 04/27/21  1637     History  Chief Complaint  Patient presents with   Hematuria    Chris Payne is a 71 y.o. male.  HPI He presents for evaluation of hematuria.  He also complains of pain in his chest, and abdomen.  He is a moderately poor historian.  He lives with family members, in their home and was transferred here from there.  He was hospitalized a month ago, and treated for COVID infection, acute renal failure, metabolic disorders and acute urinary retention.  Level 5 caveat-confusion    Home Medications Prior to Admission medications   Medication Sig Start Date End Date Taking? Authorizing Provider  acetaminophen (TYLENOL) 325 MG tablet Take 650 mg by mouth every 6 (six) hours as needed for mild pain.    [provider]  albuterol (VENTOLIN HFA) 108 (90 Base) MCG/ACT inhaler Inhale 2 puffs into the lungs every 6 (six) hours as needed for wheezing or shortness of breath. 03/08/20   [place] Donnamarie Poag, DO  Amino Acids-Protein Hydrolys (FEEDING SUPPLEMENT, PRO-STAT SUGAR FREE 64,) LIQD Take 30 mLs by mouth 2 (two) times daily. Wound health    [provider]  aspirin EC 81 MG EC tablet Take 1 tablet (81 mg total) by mouth daily. 05/24/18   Geradine Girt, DO  budesonide-formoterol (SYMBICORT) 160-4.5 MCG/ACT inhaler Inhale 2 puffs into the lungs 2 (two) times daily. 03/08/20   [place] Donnamarie Poag, DO  furosemide (LASIX) 20 MG tablet Take 1 tablet (20 mg total) by mouth daily. Patient taking differently: Take 20 mg by mouth 2 (two) times daily. X 3 days --stop date 03-27-21 02/10/20   Deno Etienne, DO  gabapentin (NEURONTIN) 300 MG capsule Take 300 mg by mouth 2 (two) times daily.    [provider]  metoprolol tartrate (LOPRESSOR) 25 MG tablet Take 1 tablet (25 mg total) by mouth 2 (two) times daily. 03/08/20 03/26/21  [place] Eric J, DO  pantoprazole  (PROTONIX) 40 MG tablet Take 40 mg by mouth daily. 02/19/21   [provider]  rosuvastatin (CRESTOR) 40 MG tablet Take 1 tablet (40 mg total) by mouth daily. 03/08/20 03/26/21  [place] Eric J, DO  tamsulosin (FLOMAX) 0.4 MG CAPS capsule Take 1 capsule (0.4 mg total) by mouth daily. 03/29/21   Corky Sox, MD      Allergies    Patient has no known allergies.    Review of Systems   Review of Systems  Unable to perform ROS: Mental status change   Physical Exam Updated Vital Signs BP 110/64    Pulse 91    Temp 100.1 F (37.8 C) (Oral)    Resp 19    SpO2 97%  Physical Exam Vitals and nursing note reviewed.  Constitutional:      Appearance: He is well-developed. He is not ill-appearing.  HENT:     Head: Normocephalic and atraumatic.     Right Ear: External ear normal.     Left Ear: External ear normal.     Nose: No congestion.     Mouth/Throat:     Pharynx: No oropharyngeal exudate.  Eyes:     General: No scleral icterus.       Right eye: No discharge.        Left eye: No discharge.     Conjunctiva/sclera: Conjunctivae normal.     Pupils: Pupils are equal, round, and reactive to  light.  Neck:     Trachea: Phonation normal.  Cardiovascular:     Rate and Rhythm: Normal rate and regular rhythm.     Heart sounds: Normal heart sounds.  Pulmonary:     Effort: Pulmonary effort is normal. No respiratory distress.     Breath sounds: Normal breath sounds. No stridor.     Comments: No increased work of breathing Abdominal:     General: There is no distension.     Palpations: Abdomen is soft. There is no mass.     Tenderness: There is no abdominal tenderness.     Hernia: No hernia is present.  Genitourinary:    Comments: Foley catheter in penis.  Penis and scrotum, scrotal contents normal.  Foley catheter has bloody urine present. Musculoskeletal:        General: Normal range of motion.     Cervical back: Normal range of motion and neck supple.  Skin:    General: Skin  is warm and dry.     Coloration: Skin is not jaundiced or pale.  Neurological:     Mental Status: He is alert.     Cranial Nerves: No cranial nerve deficit.     Sensory: No sensory deficit.     Motor: No abnormal muscle tone.     Coordination: Coordination normal.  Psychiatric:        Mood and Affect: Mood normal.        Behavior: Behavior normal.    ED Results / Procedures / Treatments   Labs (all labs ordered are listed, but only abnormal results are displayed) Labs Reviewed  RESP PANEL BY RT-PCR (FLU A&B, COVID) ARPGX2 - Abnormal; Notable for the following components:      Result Value   SARS Coronavirus 2 by RT PCR POSITIVE (*)    All other components within normal limits  COMPREHENSIVE METABOLIC PANEL - Abnormal; Notable for the following components:   Sodium 134 (*)    Glucose, Bld 147 (*)    BUN 28 (*)    Creatinine, Ser 0.58 (*)    Calcium 8.4 (*)    Albumin 2.2 (*)    All other components within normal limits  CBC WITH DIFFERENTIAL/PLATELET - Abnormal; Notable for the following components:   WBC 14.0 (*)    RBC 3.54 (*)    Hemoglobin 11.9 (*)    HCT 37.8 (*)    MCV 106.8 (*)    Neutro Abs 11.0 (*)    Monocytes Absolute 1.2 (*)    All other components within normal limits  URINALYSIS, ROUTINE W REFLEX MICROSCOPIC - Abnormal; Notable for the following components:   Color, Urine RED (*)    APPearance TURBID (*)    Glucose, UA   (*)    Value: TEST NOT REPORTED DUE TO COLOR INTERFERENCE OF URINE PIGMENT   Hgb urine dipstick   (*)    Value: TEST NOT REPORTED DUE TO COLOR INTERFERENCE OF URINE PIGMENT   Bilirubin Urine   (*)    Value: TEST NOT REPORTED DUE TO COLOR INTERFERENCE OF URINE PIGMENT   Ketones, ur   (*)    Value: TEST NOT REPORTED DUE TO COLOR INTERFERENCE OF URINE PIGMENT   Protein, ur   (*)    Value: TEST NOT REPORTED DUE TO COLOR INTERFERENCE OF URINE PIGMENT   Nitrite   (*)    Value: TEST NOT REPORTED DUE TO COLOR INTERFERENCE OF URINE PIGMENT    Leukocytes,Ua   (*)  Value: TEST NOT REPORTED DUE TO COLOR INTERFERENCE OF URINE PIGMENT   RBC / HPF >50 (*)    Bacteria, UA MANY (*)    All other components within normal limits  URINALYSIS, ROUTINE W REFLEX MICROSCOPIC - Abnormal; Notable for the following components:   Color, Urine AMBER (*)    APPearance CLOUDY (*)    Hgb urine dipstick LARGE (*)    Protein, ur 100 (*)    Leukocytes,Ua LARGE (*)    RBC / HPF >50 (*)    Bacteria, UA MANY (*)    All other components within normal limits  URINE CULTURE  TYPE AND SCREEN    EKG EKG Interpretation  Date/Time:  Sunday April 27 2021 19:18:24 EST Ventricular Rate:  86 PR Interval:  160 QRS Duration: 99 QT Interval:  369 QTC Calculation: 442 R Axis:   -11 Text Interpretation: Sinus rhythm Atrial premature complex since last tracing no significant change Confirmed by Daleen Bo [phone]) on 04/27/2021 8:20:34 PM  Radiology DG Chest Port 1 View  Result Date: 04/27/2021 CLINICAL DATA:  Pain. EXAM: PORTABLE CHEST 1 VIEW COMPARISON:  Chest x-ray 03/26/2021. FINDINGS: The aorta is tortuous, unchanged. The heart is enlarged, unchanged. The lungs and costophrenic angles are clear. There is no pneumothorax. No acute fractures are seen. IMPRESSION: 1. No acute cardiopulmonary process. 2. Stable cardiomegaly. Electronically Signed   By: Ronney Asters M.D.   On: 04/27/2021 18:24    Procedures Procedures    Medications Ordered in ED Medications  sodium chloride 0.9 % bolus 500 mL (500 mLs Intravenous New Bag/Given 04/27/21 1823)  cefTRIAXone (ROCEPHIN) 2 g in sodium chloride 0.9 % 100 mL IVPB (2 g Intravenous New Bag/Given 04/27/21 2149)    ED Course/ Medical Decision Making/ A&P                           Medical Decision Making   Patient Vitals for the past 24 hrs:  BP Temp Temp src Pulse Resp SpO2  04/27/21 2148 -- 100.1 F (37.8 C) Oral -- -- --  04/27/21 2145 110/64 -- -- 91 19 97 %  04/27/21 2130 116/64 -- -- 86 16 96 %   04/27/21 2115 (!) 106/52 -- -- 88 20 95 %  04/27/21 2100 (!) 111/58 -- -- 88 (!) 21 96 %  04/27/21 2045 103/64 -- -- 86 17 96 %  04/27/21 2030 116/60 -- -- 82 18 96 %  04/27/21 2015 122/62 -- -- 85 15 96 %  04/27/21 2000 109/60 -- -- 81 (!) 21 96 %  04/27/21 1945 106/64 -- -- 85 20 97 %  04/27/21 1930 119/66 -- -- 86 17 96 %  04/27/21 1915 (!) 124/58 -- -- 87 (!) 22 98 %  04/27/21 1900 104/61 -- -- 86 20 97 %  04/27/21 1845 111/60 -- -- 82 15 96 %  04/27/21 1830 -- -- -- 81 (!) 21 97 %  04/27/21 1815 -- -- -- 82 (!) 22 97 %  04/27/21 1800 110/68 -- -- 82 16 97 %  04/27/21 1745 114/66 -- -- 83 (!) 21 97 %  04/27/21 1730 117/70 -- -- 85 20 98 %  04/27/21 1715 121/77 -- -- 84 (!) 21 96 %  04/27/21 1700 118/67 -- -- 84 17 96 %  04/27/21 1650 129/69 (!) 100.9 F (38.3 C) Oral 87 (!) 21 96 %    9:55 PM Reevaluation with update and discussion with patient.  No family members available here or by telephone. After initial assessment and treatment, an updated evaluation reveals patient is confused, talking to people that are not there.  He states "I am just talking to myself."  After initial Foley catheter change, he continues to have moderate amount of blood in the urine which appears to be flowing well.  He has frank hematuria.  Rocephin ordered for probable UTI. Illness risk, cannot be discussed with patient due to confusion. Daleen Bo    Medical Decision Making: Summary: Patient presenting with hematuria, unable to give complete history.  Family members could not be reached.  He apparently lives at home, since most recent hospitalization.  Foley catheter was changed to make sure that it was appropriately placed and draining.  The urine still looked bloody after Foley change.  Patient with elevated white count and low-grade fever.  Suspect urinary tract infection causing hematuria.  I do not think this is frank blood because his vital signs are reassuring and his hemoglobin is only slightly  low.  Doubt sepsis or hemodynamic instability.  Patient with confusion unable to give history.  I suspect that he has dementia although this has not previously been diagnosed.  Critical Interventions-clinical evaluation, laboratory testing, Foley catheter care, Foley change.; to evaluate  Chief Complaint  Patient presents with   Hematuria    and assess for illness characterized as Acute, Previously Undiagnosed, Uncertain Prognosis, and Complicated   After These Interventions, the Patient was reevaluated and was found with persistent confusion and hematuria.  Being treated for urinary tract infection.  Doubt sepsis.  This patient is Presenting for Evaluation of hematuria, which does require a range of treatment options, and is a complaint that involves a moderate risk of morbidity and mortality. The Differential Diagnoses include UTI, Foley catheter misplacement, chronic illness, acute urinary retention. I decided to review pertinent External Data, and in summary prior hospital admission, 03/25/2021, with prostatic hypertrophy causing urinary retention.  AKI improved with Foley catheter placement.  He was also managed for lower extremity pressure sores and cellulitis.  I was unable to get any additional Historical Information from anyone by telephone, as the patient is confused and additional history was necessary.  Clinical Laboratory Tests Ordered, included CBC, Metabolic panel, and Urinalysis. Review indicates normal except urinalysis on readable due to large amount of blood, sodium low, glucose high, BUN high, creatinine normal, calcium low, albumin low, white count high, hemoglobin level. Emergent testing abnormality management required for stabilization-hematuria.  Anemia. Radiologic Tests Ordered, included chest x-ray.  I independently Visualized: No acute abnormality images, which show no infiltrate or edema  Cardiac Monitor Tracing which shows normal sinus rhythm, indicating stable  cardiac status    Pharmaceutical Risk Management antibiotics for UTI Prescription Management  Treatment Complication Risk-full history unavailable Therapeutic Limits, due to confusion.      9:45 PM-Consult complete with hospitalist. Patient case explained and discussed. Requirement for hospitalization for monitoring and treatment agreed on. Possible Risk of increased bleeding, acute infection.  She agrees to evaluate patient for further evaluation and treatment. Call ended at 9:50 PM  CRITICAL CARE-no Performed by: Daleen Bo  Nursing Notes Reviewed/ Care Coordinated Applicable Imaging Reviewed Interpretation of Laboratory Data incorporated into ED treatment  Plan: Admit           Final Clinical Impression(s) / ED Diagnoses Final diagnoses:  Hematuria, unspecified type  Urinary tract infection associated with indwelling urethral catheter, initial encounter [facility])    Rx / DC Orders ED  Discharge Orders     None         Daleen Bo, MD 04/27/21 2221

## 2021-04-27 NOTE — H&P (Addendum)
History and Physical    Chris Payne:[id number] DOB: January 12, 1951 DOA: 04/27/2021  PCP: Benito Mccreedy, MD  Patient coming from: Home   I have personally briefly reviewed patient's old medical records in [place]  Chief Complaint: hematuria  HPI: Chris Payne is a 71 y.o. male with medical history significant for CAD s/p CABG, carotid stenosis, PAD, CVA, hypertension, HFrEF, COPD, chronic pain syndrome, recent BPH with urinary retention and indwelling catheter who presents from home with concerns of hematuria.  Patient alert and oriented to self and place only.  Unable to provide any meaningful history. No answer from phone number on record. Reportedly brought in from home for hematuria.  Patient recently hospitalized from 03/25/2021 to 03/28/2021 with acute urinary retention secondary to BPH with AKI.  He was discharged home with indwelling Foley catheter.  Unclear whether he had follow-up with urology.  Does not appear to be on daily anticoagulation other than aspirin.  ED Course: He was febrile up to 100.56F, normotensive on room air. WBC of 14, hemoglobin of 11.9. Sodium of 134, K of 3.9, creatinine of 0.58, BG of 147. UA had to be repeated due to gross hematuria with initial sample.  UA positive for large leukocyte, negative nitrite, many bacteria, greater than 50 RBC.  Review of Systems: Unable to fully obtain since patient alert and oriented only to self and place  Past Medical History:  Diagnosis Date   AAA (abdominal aortic aneurysm)    s/p repair 6/11   CAD (coronary artery disease)    s/p CABG 2/12:  LIMA to LAD, SVG to diagonal-1, SVG to ramus intermedius,, SVG to AM (Dr. Roxan Hockey) ;  b.  Myoview 10/13:  inf infarct with very mild peri-infarct ischemia, EF 49%, inf HK; c  He has severe three-vessel native coronary artery disease. Catheterization March 2015 as above   CAP (community acquired pneumonia) 02/23/2014   Carotid stenosis    a.  dopplers 4/26:  LICA 83-41%;  b. Carotid dopplers 3/14:  R 0-39%, L 60-79%, repeat 6 mos   CHF (congestive heart failure) (HCC)    COPD (chronic obstructive pulmonary disease) (HCC)    GERD (gastroesophageal reflux disease)    Headache(784.0)    Hyperlipidemia    Hypertension    Hypoxia 02/23/2014   Inguinal hernia    Myocardial infarction [facility])    PCI x3   PAD (peripheral artery disease) ([facility])    ABIs 4/12:  R 0.88, L 0.92; R SFA 40%, L CFA 50%, L SFA 50-60%   Pneumonia    hx   Stroke [facility])    h/o pontine CVA   Thyroid nodule    incidental finding on carotid doppler 3/14 => thyroid U/S ordered    Past Surgical History:  Procedure Laterality Date   ABDOMINAL AORTIC ANEURYSM REPAIR  10-03-2009   CORONARY ARTERY BYPASS GRAFT  06/24/2010   LIMA to the LAD, SVG to first diagonal, SVG to ramus intermediate, SVG to acute marginal. EF 50%. 2/12   EXPLORATION POST OPERATIVE OPEN HEART     EXPLORATORY LAPAROTOMY  09/2009   ligation of lumbar arteries   EXTERNAL EAR SURGERY Left    laceration child   HIP SURGERY  40 years ago   left hip bone removal and pinning   INGUINAL HERNIA REPAIR Left 07/21/2012   Procedure: HERNIA REPAIR INGUINAL ADULT;  Surgeon: Merrie Roof, MD;  Location: [facility];  Service: General;  Laterality: Left;   INSERTION OF MESH Left  07/21/2012   Procedure: INSERTION OF MESH;  Surgeon: Merrie Roof, MD;  Location: [facility];  Service: General;  Laterality: Left;   LEFT HEART CATHETERIZATION WITH CORONARY/GRAFT ANGIOGRAM  06/21/2012   Procedure: LEFT HEART CATHETERIZATION WITH Beatrix Fetters;  Surgeon: Minus Breeding, MD;  Location: [facility] CATH LAB;  Service: Cardiovascular;;   LEFT HEART CATHETERIZATION WITH CORONARY/GRAFT ANGIOGRAM N/A 07/04/2013   Procedure: LEFT HEART CATHETERIZATION WITH Beatrix Fetters;  Surgeon: Sinclair Grooms, MD;  Location: [facility] CATH LAB;  Service: Cardiovascular;  Laterality: N/A;   NM MYOCAR PERF WALL MOTION  12/02/2010   inferoapical defect is  fixed c/w prior infarction/scar, there is minimal borderzone ischemia.     reports that he quit smoking about 11 years ago. His smoking use included cigarettes. He has a 30.00 pack-year smoking history. He has never used smokeless tobacco. He reports that he does not drink alcohol and does not use drugs. Social History  No Known Allergies  Family History  Problem Relation Age of Onset   Alcohol abuse Father    Cancer Sister 37       unknown primary   Diabetes Mother    Hypertension Mother    Cancer Sister 63       kidney cancer     Prior to Admission medications   Medication Sig Start Date End Date Taking? Authorizing Provider  acetaminophen (TYLENOL) 325 MG tablet Take 650 mg by mouth every 6 (six) hours as needed for mild pain.    [provider]  albuterol (VENTOLIN HFA) 108 (90 Base) MCG/ACT inhaler Inhale 2 puffs into the lungs every 6 (six) hours as needed for wheezing or shortness of breath. 03/08/20   [place] Donnamarie Poag, DO  Amino Acids-Protein Hydrolys (FEEDING SUPPLEMENT, PRO-STAT SUGAR FREE 64,) LIQD Take 30 mLs by mouth 2 (two) times daily. Wound health    [provider]  aspirin EC 81 MG EC tablet Take 1 tablet (81 mg total) by mouth daily. 05/24/18   Geradine Girt, DO  budesonide-formoterol (SYMBICORT) 160-4.5 MCG/ACT inhaler Inhale 2 puffs into the lungs 2 (two) times daily. 03/08/20   [place] Donnamarie Poag, DO  furosemide (LASIX) 20 MG tablet Take 1 tablet (20 mg total) by mouth daily. Patient taking differently: Take 20 mg by mouth 2 (two) times daily. X 3 days --stop date 03-27-21 02/10/20   Deno Etienne, DO  gabapentin (NEURONTIN) 300 MG capsule Take 300 mg by mouth 2 (two) times daily.    [provider]  metoprolol tartrate (LOPRESSOR) 25 MG tablet Take 1 tablet (25 mg total) by mouth 2 (two) times daily. 03/08/20 03/26/21  [place] Eric J, DO  pantoprazole (PROTONIX) 40 MG tablet Take 40 mg by mouth daily. 02/19/21   [provider]   rosuvastatin (CRESTOR) 40 MG tablet Take 1 tablet (40 mg total) by mouth daily. 03/08/20 03/26/21  [place] Eric J, DO  tamsulosin (FLOMAX) 0.4 MG CAPS capsule Take 1 capsule (0.4 mg total) by mouth daily. 03/29/21   Corky Sox, MD    Physical Exam: Vitals:   04/27/21 2115 04/27/21 2130 04/27/21 2145 04/27/21 2148  BP: (!) 106/52 116/64 110/64   Pulse: 88 86 91   Resp: 20 16 19    Temp:    100.1 F (37.8 C)  TempSrc:    Oral  SpO2: 95% 96% 97%     Constitutional: NAD, calm, comfortable, nontoxic appearing exam cachectic elderly male laying flat in bed Vitals:   04/27/21 2115 04/27/21 2130  04/27/21 2145 04/27/21 2148  BP: (!) 106/52 116/64 110/64   Pulse: 88 86 91   Resp: 20 16 19    Temp:    100.1 F (37.8 C)  TempSrc:    Oral  SpO2: 95% 96% 97%    Eyes:  lids and conjunctivae normal ENMT: Mucous membranes are moist. Neck: normal, supple, Respiratory: clear to auscultation bilaterally, no wheezing, no crackles. Normal respiratory effort.  Cardiovascular: Regular rate and rhythm, no murmurs / rubs / gallops. No extremity edema.  Abdomen: Soft, nondistended ,no tenderness,  Bowel sounds positive.  GU: Indwelling Foley catheter in place with minimal gross hematuria Musculoskeletal: no clubbing / cyanosis. No joint deformity upper and lower extremities.  Muscle wasting in all extremities. Large deep sacral wound with bone exposed and surrounding erythema Skin: no rashes, lesions, ulcers.  Neurologic: CN 2-12 grossly intact. Strength 5/5 in all 4.  Psychiatric:  Alert and oriented only to self and place. Normal mood.     Labs on Admission: I have personally reviewed following labs and imaging studies  CBC: Recent Labs  Lab 04/27/21 1834  WBC 14.0*  NEUTROABS 11.0*  HGB 11.9*  HCT 37.8*  MCV 106.8*  PLT 160   Basic Metabolic Panel: Recent Labs  Lab 04/27/21 1834  NA 134*  K 3.9  CL 99  CO2 30  GLUCOSE 147*  BUN 28*  CREATININE 0.58*  CALCIUM 8.4*    GFR: CrCl cannot be calculated (Unknown ideal weight.). Liver Function Tests: Recent Labs  Lab 04/27/21 1834  AST 29  ALT 27  ALKPHOS 108  BILITOT 0.6  PROT 6.8  ALBUMIN 2.2*   No results for input(s): LIPASE, AMYLASE in the last 168 hours. No results for input(s): AMMONIA in the last 168 hours. Coagulation Profile: No results for input(s): INR, PROTIME in the last 168 hours. Cardiac Enzymes: No results for input(s): CKTOTAL, CKMB, CKMBINDEX, TROPONINI in the last 168 hours. BNP (last 3 results) No results for input(s): PROBNP in the last 8760 hours. HbA1C: No results for input(s): HGBA1C in the last 72 hours. CBG: No results for input(s): GLUCAP in the last 168 hours. Lipid Profile: No results for input(s): CHOL, HDL, LDLCALC, TRIG, CHOLHDL, LDLDIRECT in the last 72 hours. Thyroid Function Tests: No results for input(s): TSH, T4TOTAL, FREET4, T3FREE, THYROIDAB in the last 72 hours. Anemia Panel: No results for input(s): VITAMINB12, FOLATE, FERRITIN, TIBC, IRON, RETICCTPCT in the last 72 hours. Urine analysis:    Component Value Date/Time   COLORURINE AMBER (A) 04/27/2021 2100   APPEARANCEUR CLOUDY (A) 04/27/2021 2100   LABSPEC 1.016 04/27/2021 2100   PHURINE 6.0 04/27/2021 2100   GLUCOSEU NEGATIVE 04/27/2021 2100   HGBUR LARGE (A) 04/27/2021 2100   BILIRUBINUR NEGATIVE 04/27/2021 2100   BILIRUBINUR small 08/23/2013 1736   KETONESUR NEGATIVE 04/27/2021 2100   PROTEINUR 100 (A) 04/27/2021 2100   UROBILINOGEN 1.0 02/08/2015 1902   NITRITE NEGATIVE 04/27/2021 2100   LEUKOCYTESUR LARGE (A) 04/27/2021 2100    Radiological Exams on Admission: DG Chest Port 1 View  Result Date: 04/27/2021 CLINICAL DATA:  Pain. EXAM: PORTABLE CHEST 1 VIEW COMPARISON:  Chest x-ray 03/26/2021. FINDINGS: The aorta is tortuous, unchanged. The heart is enlarged, unchanged. The lungs and costophrenic angles are clear. There is no pneumothorax. No acute fractures are seen. IMPRESSION: 1. No  acute cardiopulmonary process. 2. Stable cardiomegaly. Electronically Signed   By: Ronney Asters M.D.   On: 04/27/2021 18:24      Assessment/Plan  Sepsis secondary to  UTI with chronic indwelling catheter -Patient presented with fever, leukocytosis and positive UA -Catheter was changed in the ED.  Foley was placed during previous admission early December following acute urinary retention due to BPH -Continue IV Rocephin pending urine culture  Hematuria secondary to UTI - CT abdomen did not show any other source of bleeding - Continue treatment as above.  Hemoglobin is stable 11.9.  Cellulitis stage IV/unstageable sacral ulcer -Start IV vancomycin - Wound consult.  CT abdomen pelvis cannot rule out osteomyelitis-consider Ortho consult in the morning  Hx COVID-19 viral infection Has been positive since 11/11 and previously treated with steroid No respiratory symptoms noted. Okay to be off isolation precaution.   HFrEF Euvolemic on exam Last echocardiogram on 03/08/2021 with EF of 55 to 60% and grade 1 diastolic dysfunction  AAA unchanged infrarenal AAA at 5.4cm continue outpatient following up with vascular   Will need to follow up on med rec since family not answering phone and pt not able to provide history  DVT prophylaxis:SCDs Code Status: Full Family Communication: Unable to reach family on record after 2 attempts disposition Plan: Home with observation Consults called:  Admission status: Observation  Level of care: Med-Surg  Status is: Observation  The patient remains OBS appropriate and will d/c before 2 midnights.        Orene Desanctis DO [facility]   If 7PM-7AM, please contact night-coverage [email or web address]   04/27/2021, 10:34 PM

## 2021-04-28 DIAGNOSIS — T83511A Infection and inflammatory reaction due to indwelling urethral catheter, initial encounter: Secondary | ICD-10-CM | POA: Diagnosis present

## 2021-04-28 DIAGNOSIS — Z87891 Personal history of nicotine dependence: Secondary | ICD-10-CM | POA: Diagnosis not present

## 2021-04-28 DIAGNOSIS — L89150 Pressure ulcer of sacral region, unstageable: Secondary | ICD-10-CM | POA: Diagnosis present

## 2021-04-28 DIAGNOSIS — R319 Hematuria, unspecified: Secondary | ICD-10-CM | POA: Diagnosis not present

## 2021-04-28 DIAGNOSIS — I252 Old myocardial infarction: Secondary | ICD-10-CM | POA: Diagnosis not present

## 2021-04-28 DIAGNOSIS — Z8679 Personal history of other diseases of the circulatory system: Secondary | ICD-10-CM | POA: Diagnosis not present

## 2021-04-28 DIAGNOSIS — J449 Chronic obstructive pulmonary disease, unspecified: Secondary | ICD-10-CM | POA: Diagnosis present

## 2021-04-28 DIAGNOSIS — R339 Retention of urine, unspecified: Secondary | ICD-10-CM | POA: Diagnosis not present

## 2021-04-28 DIAGNOSIS — G894 Chronic pain syndrome: Secondary | ICD-10-CM | POA: Diagnosis present

## 2021-04-28 DIAGNOSIS — Y846 Urinary catheterization as the cause of abnormal reaction of the patient, or of later complication, without mention of misadventure at the time of the procedure: Secondary | ICD-10-CM | POA: Diagnosis present

## 2021-04-28 DIAGNOSIS — L89151 Pressure ulcer of sacral region, stage 1: Secondary | ICD-10-CM | POA: Diagnosis not present

## 2021-04-28 DIAGNOSIS — I5020 Unspecified systolic (congestive) heart failure: Secondary | ICD-10-CM | POA: Diagnosis not present

## 2021-04-28 DIAGNOSIS — I739 Peripheral vascular disease, unspecified: Secondary | ICD-10-CM | POA: Diagnosis present

## 2021-04-28 DIAGNOSIS — I2510 Atherosclerotic heart disease of native coronary artery without angina pectoris: Secondary | ICD-10-CM | POA: Diagnosis present

## 2021-04-28 DIAGNOSIS — Z8673 Personal history of transient ischemic attack (TIA), and cerebral infarction without residual deficits: Secondary | ICD-10-CM | POA: Diagnosis not present

## 2021-04-28 DIAGNOSIS — A419 Sepsis, unspecified organism: Secondary | ICD-10-CM | POA: Diagnosis not present

## 2021-04-28 DIAGNOSIS — N390 Urinary tract infection, site not specified: Secondary | ICD-10-CM | POA: Diagnosis present

## 2021-04-28 DIAGNOSIS — K219 Gastro-esophageal reflux disease without esophagitis: Secondary | ICD-10-CM | POA: Diagnosis present

## 2021-04-28 DIAGNOSIS — L0390 Cellulitis, unspecified: Secondary | ICD-10-CM

## 2021-04-28 DIAGNOSIS — E876 Hypokalemia: Secondary | ICD-10-CM | POA: Diagnosis present

## 2021-04-28 DIAGNOSIS — U071 COVID-19: Secondary | ICD-10-CM | POA: Diagnosis present

## 2021-04-28 DIAGNOSIS — I110 Hypertensive heart disease with heart failure: Secondary | ICD-10-CM | POA: Diagnosis present

## 2021-04-28 DIAGNOSIS — N401 Enlarged prostate with lower urinary tract symptoms: Secondary | ICD-10-CM | POA: Diagnosis present

## 2021-04-28 DIAGNOSIS — Z9889 Other specified postprocedural states: Secondary | ICD-10-CM

## 2021-04-28 DIAGNOSIS — I5032 Chronic diastolic (congestive) heart failure: Secondary | ICD-10-CM | POA: Diagnosis present

## 2021-04-28 DIAGNOSIS — L03818 Cellulitis of other sites: Secondary | ICD-10-CM | POA: Diagnosis present

## 2021-04-28 DIAGNOSIS — Z8616 Personal history of COVID-19: Secondary | ICD-10-CM

## 2021-04-28 DIAGNOSIS — E785 Hyperlipidemia, unspecified: Secondary | ICD-10-CM | POA: Diagnosis present

## 2021-04-28 DIAGNOSIS — R338 Other retention of urine: Secondary | ICD-10-CM | POA: Diagnosis present

## 2021-04-28 DIAGNOSIS — R310 Gross hematuria: Secondary | ICD-10-CM | POA: Diagnosis present

## 2021-04-28 DIAGNOSIS — L03317 Cellulitis of buttock: Secondary | ICD-10-CM | POA: Diagnosis not present

## 2021-04-28 DIAGNOSIS — L89626 Pressure-induced deep tissue damage of left heel: Secondary | ICD-10-CM | POA: Diagnosis present

## 2021-04-28 LAB — BASIC METABOLIC PANEL
Anion gap: 6 (ref 5–15)
BUN: 21 mg/dL (ref 8–23)
CO2: 27 mmol/L (ref 22–32)
Calcium: 8.2 mg/dL — ABNORMAL LOW (ref 8.9–10.3)
Chloride: 99 mmol/L (ref 98–111)
Creatinine, Ser: 0.48 mg/dL — ABNORMAL LOW (ref 0.61–1.24)
GFR, Estimated: >60 mL/min (ref >60)
Glucose, Bld: 122 mg/dL — ABNORMAL HIGH (ref 70–99)
Potassium: 3.2 mmol/L — ABNORMAL LOW (ref 3.5–5.1)
Sodium: 132 mmol/L — ABNORMAL LOW (ref 135–145)

## 2021-04-28 LAB — C-REACTIVE PROTEIN: CRP: 9.4 mg/dL — ABNORMAL HIGH (ref <1.0)

## 2021-04-28 LAB — CBC
HCT: 33.2 % — ABNORMAL LOW (ref 39.0–52.0)
Hemoglobin: 10.5 g/dL — ABNORMAL LOW (ref 13.0–17.0)
MCH: 33.4 pg (ref 26.0–34.0)
MCHC: 31.6 g/dL (ref 30.0–36.0)
MCV: 105.7 fL — ABNORMAL HIGH (ref 80.0–100.0)
Platelets: 248 10*3/uL (ref 150–400)
RBC: 3.14 MIL/uL — ABNORMAL LOW (ref 4.22–5.81)
RDW: 15.3 % (ref 11.5–15.5)
WBC: 12.5 10*3/uL — ABNORMAL HIGH (ref 4.0–10.5)
nRBC: 0 % (ref 0.0–0.2)

## 2021-04-28 LAB — MAGNESIUM: Magnesium: 1.8 mg/dL (ref 1.7–2.4)

## 2021-04-28 LAB — SEDIMENTATION RATE: Sed Rate: 105 mm/hr — ABNORMAL HIGH (ref 0–16)

## 2021-04-28 MED ORDER — MOMETASONE FURO-FORMOTEROL FUM 200-5 MCG/ACT IN AERO
2.0000 | INHALATION_SPRAY | Freq: Two times a day (BID) | RESPIRATORY_TRACT | Status: DC
  Administered 2021-04-29 – 2021-05-03 (×9): 2 via RESPIRATORY_TRACT
  Filled 2021-04-28 (×2): qty 8.8

## 2021-04-28 MED ORDER — ALBUTEROL SULFATE HFA 108 (90 BASE) MCG/ACT IN AERS
2.0000 | INHALATION_SPRAY | Freq: Four times a day (QID) | RESPIRATORY_TRACT | Status: DC | PRN
  Filled 2021-04-28: qty 6.7

## 2021-04-28 MED ORDER — ACETAMINOPHEN 325 MG PO TABS
650.0000 mg | ORAL_TABLET | Freq: Four times a day (QID) | ORAL | Status: DC | PRN
  Administered 2021-04-28 – 2021-05-03 (×9): 650 mg via ORAL
  Filled 2021-04-28 (×9): qty 2

## 2021-04-28 MED ORDER — GABAPENTIN 300 MG PO CAPS
300.0000 mg | ORAL_CAPSULE | Freq: Two times a day (BID) | ORAL | Status: DC
  Administered 2021-04-28 – 2021-05-03 (×12): 300 mg via ORAL
  Filled 2021-04-28 (×12): qty 1

## 2021-04-28 MED ORDER — CHLORHEXIDINE GLUCONATE CLOTH 2 % EX PADS
6.0000 | MEDICATED_PAD | Freq: Every day | CUTANEOUS | Status: DC
  Administered 2021-04-28 – 2021-05-03 (×6): 6 via TOPICAL

## 2021-04-28 MED ORDER — SODIUM CHLORIDE 0.9 % IV SOLN: INTRAVENOUS | Status: DC

## 2021-04-28 MED ORDER — GUAIFENESIN ER 600 MG PO TB12
1200.0000 mg | ORAL_TABLET | Freq: Two times a day (BID) | ORAL | Status: DC
  Administered 2021-04-28 – 2021-05-03 (×12): 1200 mg via ORAL
  Filled 2021-04-28 (×12): qty 2

## 2021-04-28 MED ORDER — TAMSULOSIN HCL 0.4 MG PO CAPS
0.4000 mg | ORAL_CAPSULE | Freq: Every day | ORAL | Status: DC
  Administered 2021-04-28 – 2021-05-03 (×6): 0.4 mg via ORAL
  Filled 2021-04-28 (×6): qty 1

## 2021-04-28 MED ORDER — SODIUM CHLORIDE 0.9 % IV SOLN: INTRAVENOUS | Status: AC

## 2021-04-28 MED ORDER — SODIUM CHLORIDE 0.9 % IV SOLN
2.0000 g | INTRAVENOUS | Status: DC
  Administered 2021-04-28 – 2021-04-30 (×3): 2 g via INTRAVENOUS
  Filled 2021-04-28 (×3): qty 20

## 2021-04-28 MED ORDER — PANTOPRAZOLE SODIUM 40 MG PO TBEC
40.0000 mg | DELAYED_RELEASE_TABLET | Freq: Every day | ORAL | Status: DC
  Administered 2021-04-28 – 2021-05-03 (×6): 40 mg via ORAL
  Filled 2021-04-28 (×6): qty 1

## 2021-04-28 MED ORDER — IPRATROPIUM-ALBUTEROL 0.5-2.5 (3) MG/3ML IN SOLN
3.0000 mL | Freq: Three times a day (TID) | RESPIRATORY_TRACT | Status: DC
  Administered 2021-04-28: 3 mL via RESPIRATORY_TRACT
  Filled 2021-04-28: qty 3

## 2021-04-28 MED ORDER — COLLAGENASE 250 UNIT/GM EX OINT
TOPICAL_OINTMENT | Freq: Every day | CUTANEOUS | Status: DC
  Filled 2021-04-28: qty 30

## 2021-04-28 MED ORDER — ROSUVASTATIN CALCIUM 20 MG PO TABS
40.0000 mg | ORAL_TABLET | Freq: Every day | ORAL | Status: DC
  Administered 2021-04-28 – 2021-05-03 (×6): 40 mg via ORAL
  Filled 2021-04-28 (×4): qty 2

## 2021-04-28 MED ORDER — POTASSIUM CHLORIDE CRYS ER 20 MEQ PO TBCR
40.0000 meq | EXTENDED_RELEASE_TABLET | Freq: Once | ORAL | Status: AC
  Administered 2021-04-28: 40 meq via ORAL
  Filled 2021-04-28: qty 2

## 2021-04-28 MED ORDER — DAKINS (1/4 STRENGTH) 0.125 % EX SOLN
Freq: Every day | CUTANEOUS | Status: AC
  Filled 2021-04-28: qty 473

## 2021-04-28 MED ORDER — PROSOURCE PLUS PO LIQD
30.0000 mL | Freq: Two times a day (BID) | ORAL | Status: DC
  Administered 2021-04-29 – 2021-05-03 (×9): 30 mL via ORAL
  Filled 2021-04-28 (×12): qty 30

## 2021-04-28 MED ORDER — ENSURE ENLIVE PO LIQD
237.0000 mL | Freq: Two times a day (BID) | ORAL | Status: DC
  Administered 2021-04-28 – 2021-04-30 (×4): 237 mL via ORAL

## 2021-04-28 MED ORDER — METOPROLOL TARTRATE 25 MG PO TABS
25.0000 mg | ORAL_TABLET | Freq: Two times a day (BID) | ORAL | Status: DC
  Administered 2021-04-28 – 2021-05-03 (×11): 25 mg via ORAL
  Filled 2021-04-28 (×11): qty 1

## 2021-04-28 MED ORDER — VANCOMYCIN HCL 1750 MG/350ML IV SOLN
1750.0000 mg | INTRAVENOUS | Status: DC
  Administered 2021-04-29 – 2021-05-01 (×4): 1750 mg via INTRAVENOUS
  Filled 2021-04-28 (×5): qty 350

## 2021-04-28 MED ORDER — IPRATROPIUM-ALBUTEROL 0.5-2.5 (3) MG/3ML IN SOLN: 3.0000 mL | Freq: Two times a day (BID) | RESPIRATORY_TRACT | Status: DC

## 2021-04-28 NOTE — ED Notes (Signed)
Pt. On cardiac monitor. 

## 2021-04-28 NOTE — Progress Notes (Signed)
Pharmacy Antibiotic Note  Chris Payne is a 71 y.o. male admitted on 04/27/2021 with medical history significant for CAD s/p CABG, carotid stenosis, PAD, CVA, hypertension, HFrEF, COPD, chronic pain syndrome, recent BPH with urinary retention and indwelling catheter who presents from home with concerns of hematuria.  Pharmacy has been consulted to dose vancomycin for cellulitis.  1st dose given in ED  Plan: Vancomycin 1750mg  IV q24h (AUC 481.4, Usec Scr 0.8) Follow renal function and clinical course  Weight: 72 kg (158 lb 11.7 oz)  Temp (24hrs), Avg:100.5 F (38.1 C), Min:100.1 F (37.8 C), Max:100.9 F (38.3 C)  Recent Labs  Lab 04/27/21 1834  WBC 14.0*  CREATININE 0.58*    Estimated Creatinine Clearance: 79.2 mL/min (A) (by C-G formula based on SCr of 0.58 mg/dL (L)).    No Known Allergies  Antimicrobials this admission: 1/1 CTX >> 1/1 vanc >>  Dose adjustments this admission:   Microbiology results: 1/1 UCx:   Thank you for allowing pharmacy to be a part of this patients care.  Dolly Rias RPh 04/28/2021, 3:02 AM

## 2021-04-28 NOTE — Progress Notes (Signed)
Dulera MDI is not available at this time- no administration.

## 2021-04-28 NOTE — Consult Note (Signed)
WOC Nurse Consult Note: Patient receiving care in [facility] ED [phone] Reason for Consult: Sacral wound Wound type: Unstageable PI to the coccyx. Worsened since last admission Pressure Injury POA: Yes Measurement: 14 cm x 15 cm  Wound bed: Pink, purple, black Drainage (amount, consistency, odor) Serosanguinous on foam dressing. Patient was soiled.  Periwound: macerated Dressing procedure/placement/frequency: Will order Hydrotherapy M-Sa followed by Dakin's x 5 days then Santyl once the Dakin's order expires.  Monitor the wound area(s) for worsening of condition such as: Signs/symptoms of infection, increase in size, development of or worsening of odor, development of pain, or increased pain at the affected locations.   Notify the medical team if any of these develop.  Thank you for the consult. [place] nurse will not follow at this time.   Please re-consult the [place] team if needed.  Cathlean Marseilles Tamala Julian, MSN, RN, [place], Lysle Pearl, [facility] Wound Treatment Associate Pager [phone]

## 2021-04-28 NOTE — Progress Notes (Signed)
PROGRESS NOTE    Chris Payne  OVZ:[id number] DOB: 1950-10-01 DOA: 04/27/2021 PCP: Benito Mccreedy, MD    Chief Complaint  Patient presents with   Hematuria    Brief Narrative:  Patient 71 year old gentleman history of coronary artery disease status post CABG, carotid stenosis, PAD, CVA, hypertension, COPD, chronic pain syndrome, recent BPH with urinary retention and indwelling Foley catheter presenting from home with concerns of hematuria. Patient noted on admission to be alert and oriented to self and place only unable to provide any meaningful history.  Patient noted to have recently been hospitalized 03/25/2021-03/28/2021 with acute urinary retention secondary to BPH with AKI discharged home with indwelling Foley catheter unclear as to whether he had follow-up with urology.  Patient not on daily anticoagulation.  Patient seen in the ED noted to have fever with temp up to 100.9, leukocytosis, urinalysis with gross hematuria on initial sample and UA positive for large leukocytes, nitrite negative, many bacteria, greater than 50 RBCs concerning for UTI.  Patient also noted to have a stage IV sacral decubitus with surrounding erythema concerning for cellulitis.  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  Patient placed empirically on IV Rocephin, IV vancomycin.  Wound care RN consulted.  General surgery consulted due to concerns for osteomyelitis.   Assessment & Plan:   Principal Problem:   UTI (urinary tract infection) Active Problems:   Unstageable pressure ulcer of sacral region ([place])   HFrEF (heart failure  with reduced ejection fraction) (H[facility])   Urinary retention   Sepsis ([place])   Cellulitis   History of COVID-19   History of AAA (abdominal aortic aneurysm) repair  #1 sepsis secondary to UTI with chronic indwelling Foley catheter, POA -Patient presented with criteria consistent with sepsis with fever, leukocytosis, positive urinalysis. -Foley catheter changed in the ED. -Initial Foley noted to have been placed during previous admission in early December following acute urinary retention due to BPH. -Urine cultures pending. -Continue empiric IV Rocephin.  Follow.  2.  Hematuria secondary to UTI -CT abdomen and pelvis done with no other significant source for bleeding. -Foley catheter changed with improvement with hematuria. -Hemoglobin stable at 10.5. -Follow.  3.  Sacral cellulitis/stage IV unstageable sacral ulcer -CT abdomen and pelvis concerning for possible osteomyelitis. -Sed rate ordered this morning elevated at 105, CRP at 9.4. -Wound care consulted and hydrotherapy ordered as well as dressing changes. -Consult with general surgery for further evaluation and management. -Continue empiric IV vancomycin.  4.  History of COVID-19 viral infection -Patient noted to have been positive for COVID-19 since 03/07/2021 and previously treated with steroids. -Currently asymptomatic. -Not hypoxic. -No need for airborne precautions or isolation at this time.  5.  Chronic diastolic CHF/ CAD s/p CABG -Euvolemic on examination. -Stable. -Resume home regimen Lopressor 25 mg p.o. twice daily. -Outpatient follow-up.  6.  Hypokalemia -Magnesium at 1.8. -K-Dur 40 mEq p.o. x1. -Magnesium sulfate 2 g IV x1. -Repeat labs in the morning.  7.AAA/status post aortofemoral bypass grafting -Noted on CT abdomen and pelvis unchanged. -Outpatient follow-up with vascular surgery.   DVT prophylaxis: SCDs Code Status: Full Family Communication: Updated patient.  No family at bedside Disposition:  Status is: Inpatient  The patient will require care spanning > 2 midnights and should be moved to inpatient because: Severity of illness      Consultants:  General surgery pending Wound care RN  Procedures: CT abdomen and pelvis 04/27/2021 Chest x-ray 04/27/2021  Antimicrobials:  IV Rocephin 04/27/2021>>>>> IV vancomycin 04/27/2021>>>>>   Subjective: Sitting up in bed on gurney in ED eating breakfast.  Not sure as to why he is in the hospital.  Complain of some chest tightness but in the same sentence states he is feeling okay and better than when he presented to the hospital.  No abdominal pain.  Denies any significant shortness of breath.  Pleasantly confused.  Objective: Vitals:   04/28/21 0515 04/28/21 0530 04/28/21 0630 04/28/21 0800  BP: 115/61 109/63 104/63 109/63  Pulse:  80 74 77  Resp: 18 17 17 18   Temp:    99.6 F (37.6 C)  TempSrc:    Oral  SpO2: 97% 98% 97% 96%  Weight:        Intake/Output Summary (Last 24 hours) at 04/28/2021 0954 Last data filed at 04/28/2021 9147 Gross per 24 hour  Intake 600 ml  Output 300 ml  Net 300 ml   Filed Weights   04/27/21 2230  Weight: 72 kg    Examination:  General exam: Appears calm and comfortable  Respiratory system: Clear to auscultation anterior lung fields.  No wheezes, no crackles, no rhonchi.  Fair air movement.  Speaking in full sentences. Cardiovascular system: S1 & S2 heard, RRR. No JVD, murmurs, rubs, gallops or clicks. No pedal edema. Gastrointestinal system: Abdomen is nondistended, soft and nontender. No organomegaly or masses felt. Normal bowel sounds heard. Genitourinary: Indwelling Foley catheter with minimal hematuria noted. Central nervous system: Alert and oriented. No focal neurological deficits. Extremities: Symmetric 5 x 5 power. Skin: Large deep sacral wound stage IV with bone exposed with surrounding erythema. Psychiatry: Judgement and insight appear normal. Mood & affect appropriate.     Data  Reviewed: I have personally reviewed following labs and imaging studies  CBC: Recent Labs  Lab 04/27/21 1834 04/28/21 0817  WBC 14.0* 12.5*  NEUTROABS 11.0*  --   HGB 11.9* 10.5*  HCT 37.8* 33.2*  MCV 106.8* 105.7*  PLT 283 829    Basic Metabolic Panel: Recent Labs  Lab 04/27/21 1834 04/28/21 0817  NA 134* 132*  K 3.9 3.2*  CL 99 99  CO2 30 27  GLUCOSE 147* 122*  BUN 28* 21  CREATININE 0.58* 0.48*  CALCIUM 8.4* 8.2*    GFR: Estimated Creatinine Clearance: 79.2 mL/min (A) (by C-G formula based on SCr of 0.48 mg/dL (L)).  Liver Function Tests: Recent Labs  Lab 04/27/21 1834  AST 29  ALT 27  ALKPHOS 108  BILITOT 0.6  PROT 6.8  ALBUMIN 2.2*    CBG: No results for input(s): GLUCAP in the last 168 hours.   Recent Results (from the past 240 hour(s))  Resp Panel by RT-PCR (Flu A&B, Covid) Nasopharyngeal Swab     Status: Abnormal   Collection Time: 04/27/21  6:34 PM   Specimen: Nasopharyngeal Swab; Nasopharyngeal(NP) swabs in vial transport medium  Result Value Ref Range Status   SARS Coronavirus 2 by RT PCR POSITIVE (A) NEGATIVE Final    Comment: (NOTE) SARS-CoV-2 target nucleic acids are DETECTED.  The SARS-CoV-2 RNA is generally detectable in upper respiratory specimens during the acute phase of infection. Positive results are indicative of the presence of the identified virus, but  do not rule out bacterial infection or co-infection with other pathogens not detected by the test. Clinical correlation with patient history and other diagnostic information is necessary to determine patient infection status. The expected result is Negative.  Fact Sheet for Patients: EntrepreneurPulse.com.au  Fact Sheet for Healthcare Providers: IncredibleEmployment.be  This test is not yet approved or cleared by the [place] FDA and  has been authorized for detection and/or diagnosis of SARS-CoV-2 by FDA under an Emergency Use  Authorization (EUA).  This EUA will remain in effect (meaning this test can be used) for the duration of  the COVID-19 declaration under Section 564(b)(1) of the A ct, 21 U.S.C. section 360bbb-3(b)(1), unless the authorization is terminated or revoked sooner.     Influenza A by PCR NEGATIVE NEGATIVE Final   Influenza B by PCR NEGATIVE NEGATIVE Final    Comment: (NOTE) The Xpert Xpress SARS-CoV-2/FLU/RSV plus assay is intended as an aid in the diagnosis of influenza from Nasopharyngeal swab specimens and should not be used as a sole basis for treatment. Nasal washings and aspirates are unacceptable for Xpert Xpress SARS-CoV-2/FLU/RSV testing.  Fact Sheet for Patients: EntrepreneurPulse.com.au  Fact Sheet for Healthcare Providers: IncredibleEmployment.be  This test is not yet approved or cleared by the [place] FDA and has been authorized for detection and/or diagnosis of SARS-CoV-2 by FDA under an Emergency Use Authorization (EUA). This EUA will remain in effect (meaning this test can be used) for the duration of the COVID-19 declaration under Section 564(b)(1) of the Act, 21 U.S.C. section 360bbb-3(b)(1), unless the authorization is terminated or revoked.  Performed at [facility], [place] [address], [place], [place] [address]          Radiology Studies: CT ABDOMEN PELVIS W CONTRAST  Result Date: 04/28/2021 CLINICAL DATA:  Gross hematuria.  Indwelling catheter. EXAM: CT ABDOMEN AND PELVIS WITH CONTRAST TECHNIQUE: Multidetector CT imaging of the abdomen and pelvis was performed using the standard protocol following bolus administration of intravenous contrast. CONTRAST:  31mL OMNIPAQUE IOHEXOL 350 MG/ML SOLN COMPARISON:  CT without contrast 03/26/2021, CT with contrast 09/15/2020 FINDINGS: Lower chest: There is scattered linear scarring or atelectasis. Right lower lobe posterior basal bronchiolar thickening and  multifocal bronchiolar impaction are increased since the last CT, without visible bronchopneumonia. There is cardiomegaly with sternotomy sutures and native calcific CAD. Hepatobiliary: Stable hepatic cysts. 2.8 cm single stone in the mid gallbladder lumen with mild gallbladder dilatation without wall thickening or biliary dilatation. Pancreas: Moderate diffuse fatty atrophy. No mass enhancement or ductal dilatation. Spleen: Normal. Adrenals/Urinary Tract: Stable 1.5 cm right adrenal adenoma. Normal left adrenal. Few tiny subcentimeter too small to characterize hypodensities again noted right kidney with cortical scarring anterior left lower pole. No mass enhancement, renal calculus or hydronephrosis is seen. The bladder was previously markedly dilated now is catheterized contracted and not well seen. There are no perivesical inflammatory changes but the bladder wall could be at least mildly thickened. There are few tiny stones in the right side of the lumen. Stomach/Bowel: No dilatation or wall thickening including the appendix. Moderate fecal stasis. Scattered uncomplicated colonic diverticula. Vascular/Lymphatic: Patent aortobifemoral bypass grafting but with severe stenosis at the left femoral graft surgical anastomosis. This was seen previously. Native aortoiliac atherosclerosis is again noted with 5.4 cm thrombosed saccular infrarenal AAA , not significantly changed. Reproductive: Prominent prostate gland measuring 5.2 cm. Brachytherapy seeds and/or surgical clips in the prostate are again shown. Other: Small umbilical and inguinal fat hernias. No free air, hemorrhage  or fluid. Musculoskeletal: Skin breakdown over the dorsal aspect of the sacrococcygeal junction occurred since the prior study. Underlying osteomyelitis is not excluded. Left hip chronic arthrodesis pinning is again shown, osteopenia and degenerative changes of the lumbar spine and severe L5-S1 degenerative foraminal stenosis. IMPRESSION: 1. The  bladder, previously markedly distended has been catheterized since the last CT and is now decompressed and not well seen but could be thickened. There are few tiny stones in the right side of the lumen. 2. Interval skin breakdown over the dorsal sacrococcygeal junction. Underlying osteomyelitis not excluded but no frank destructive lesion is evident. 3. Distended gallbladder with single 2.8 cm stone but no wall thickening or biliary dilatation. 4. Aortobifemoral bypass grafting with severe stenosis at the left femoral graft tie-in. 5. 5.4 cm thrombosed saccular infrarenal AAA , not significantly changed. Vascular surgery follow-up suggested unless already scheduled. 6. Multifocal bronchiolar impaction in the right lower lobe. 7. Hepatic cysts and other chronic findings discussed above. Electronically Signed   By: Telford Nab M.D.   On: 04/28/2021 00:19   DG Chest Port 1 View  Result Date: 04/27/2021 CLINICAL DATA:  Pain. EXAM: PORTABLE CHEST 1 VIEW COMPARISON:  Chest x-ray 03/26/2021. FINDINGS: The aorta is tortuous, unchanged. The heart is enlarged, unchanged. The lungs and costophrenic angles are clear. There is no pneumothorax. No acute fractures are seen. IMPRESSION: 1. No acute cardiopulmonary process. 2. Stable cardiomegaly. Electronically Signed   By: Ronney Asters M.D.   On: 04/27/2021 18:24        Scheduled Meds:  feeding supplement (PRO-STAT SUGAR FREE 64)  30 mL Oral BID   gabapentin  300 mg Oral BID   guaiFENesin  1,200 mg Oral BID   ipratropium-albuterol  3 mL Nebulization TID   mometasone-formoterol  2 puff Inhalation BID   pantoprazole  40 mg Oral Daily   potassium chloride  40 mEq Oral Once   rosuvastatin  40 mg Oral Daily   tamsulosin  0.4 mg Oral Daily   Continuous Infusions:  sodium chloride     cefTRIAXone (ROCEPHIN)  IV     vancomycin       LOS: 0 days    Time spent: 40 minutes    Irine Seal, MD [facility]   To contact the attending provider  between 7A-7P or the covering provider during after hours 7P-7A, please log into the web site [email or web address] and access using universal [place] password for that web site. If you do not have the password, please call the hospital operator.  04/28/2021, 9:54 AM

## 2021-04-28 NOTE — ED Notes (Signed)
Unable to give 930-10am meds due to not being verified. Pharmacy notified by RN.

## 2021-04-29 DIAGNOSIS — N390 Urinary tract infection, site not specified: Secondary | ICD-10-CM | POA: Diagnosis not present

## 2021-04-29 DIAGNOSIS — T83511A Infection and inflammatory reaction due to indwelling urethral catheter, initial encounter: Secondary | ICD-10-CM | POA: Diagnosis not present

## 2021-04-29 LAB — CBC WITH DIFFERENTIAL/PLATELET
Abs Immature Granulocytes: 0.06 10*3/uL (ref 0.00–0.07)
Basophils Absolute: 0 10*3/uL (ref 0.0–0.1)
Basophils Relative: 0 %
Eosinophils Absolute: 0 10*3/uL (ref 0.0–0.5)
Eosinophils Relative: 0 %
HCT: 31.7 % — ABNORMAL LOW (ref 39.0–52.0)
Hemoglobin: 9.7 g/dL — ABNORMAL LOW (ref 13.0–17.0)
Immature Granulocytes: 1 %
Lymphocytes Relative: 22 %
Lymphs Abs: 2.6 10*3/uL (ref 0.7–4.0)
MCH: 33.6 pg (ref 26.0–34.0)
MCHC: 30.6 g/dL (ref 30.0–36.0)
MCV: 109.7 fL — ABNORMAL HIGH (ref 80.0–100.0)
Monocytes Absolute: 1.2 10*3/uL — ABNORMAL HIGH (ref 0.1–1.0)
Monocytes Relative: 11 %
Neutro Abs: 7.7 10*3/uL (ref 1.7–7.7)
Neutrophils Relative %: 66 %
Platelets: 243 10*3/uL (ref 150–400)
RBC: 2.89 MIL/uL — ABNORMAL LOW (ref 4.22–5.81)
RDW: 15.2 % (ref 11.5–15.5)
WBC: 11.6 10*3/uL — ABNORMAL HIGH (ref 4.0–10.5)
nRBC: 0 % (ref 0.0–0.2)

## 2021-04-29 LAB — RENAL FUNCTION PANEL
Albumin: 1.7 g/dL — ABNORMAL LOW (ref 3.5–5.0)
Anion gap: 10 (ref 5–15)
BUN: 19 mg/dL (ref 8–23)
CO2: 25 mmol/L (ref 22–32)
Calcium: 8.1 mg/dL — ABNORMAL LOW (ref 8.9–10.3)
Chloride: 103 mmol/L (ref 98–111)
Creatinine, Ser: 0.61 mg/dL (ref 0.61–1.24)
GFR, Estimated: >60 mL/min (ref >60)
Glucose, Bld: 112 mg/dL — ABNORMAL HIGH (ref 70–99)
Phosphorus: 2.6 mg/dL (ref 2.5–4.6)
Potassium: 4.4 mmol/L (ref 3.5–5.1)
Sodium: 138 mmol/L (ref 135–145)

## 2021-04-29 LAB — URINE CULTURE

## 2021-04-29 LAB — MAGNESIUM: Magnesium: 1.9 mg/dL (ref 1.7–2.4)

## 2021-04-29 MED ORDER — METRONIDAZOLE 500 MG/100ML IV SOLN
500.0000 mg | Freq: Two times a day (BID) | INTRAVENOUS | Status: DC
  Administered 2021-04-29 – 2021-05-01 (×6): 500 mg via INTRAVENOUS
  Filled 2021-04-29 (×6): qty 100

## 2021-04-29 NOTE — Plan of Care (Signed)
  Problem: Safety: Goal: Ability to remain free from injury will improve Outcome: Progressing   Problem: Pain Managment: Goal: General experience of comfort will improve Outcome: Progressing   Problem: Skin Integrity: Goal: Risk for impaired skin integrity will decrease Outcome: Progressing   

## 2021-04-29 NOTE — Progress Notes (Addendum)
04/29/21 1300 Hydrotherapy evaluation  Subjective Assessment  Subjective How does it look  Patient and Family Stated Goals agreed with PLS  Date of Onset  (present on admit)  Prior Treatments per pt, family changed dress.  Evaluation and Treatment  Evaluation and Treatment Procedures Explained to Patient/Family Yes  Evaluation and Treatment Procedures agreed to  Pressure Injury 04/29/21 Sacrum Medial Unstageable - Full thickness tissue loss in which the base of the injury is covered by slough (yellow, tan, gray, green or brown) and/or eschar (tan, brown or black) in the wound bed. PT only, large painfu wound  Date First Assessed/Time First Assessed: 04/29/21 1130   Location: Sacrum  Location Orientation: Medial  Staging: Unstageable - Full thickness tissue loss in which the base of the injury is covered by slough (yellow, tan, gray, green or brown) and/or ...  Dressing Type Foam - Lift dressing to assess site every shift;Gauze (Comment);Dakin's-soaked gauze;Moist to dry  Dressing Changed  Dressing Change Frequency Daily  State of Healing Eschar  Site / Wound Assessment Bleeding;Brown;Yellow  % Wound base Red or Granulating 0%  % Wound base Yellow/Fibrinous Exudate 100%  Peri-wound Assessment Excoriated;Maceration  Wound Length (cm) 6 cm  Wound Width (cm) 5.5 cm  Wound Depth (cm) 1 cm  Wound Surface Area (cm^2) 33 cm^2  Wound Volume (cm^3) 33 cm^3  Undermining (cm) 1-1.5  Margins Unattached edges (unapproximated)  Drainage Amount Moderate  Drainage Description Odor  Treatment Debridement (Selective);Hydrotherapy (Pulse lavage) (Dakins)  Hydrotherapy  Pulsed Lavage with Suction (psi) -4 psi  Pulsed Lavage with Suction - Normal Saline Used 1000 mL  Pulsed Lavage Tip Tip with splash shield  Pulsed lavage therapy - wound location sacral  Selective Debridement  Selective Debridement - Location sacral  Selective Debridement - Tools Used Forceps;Scissors  Selective Debridement -  Tissue Removed eschar, slough  Wound Therapy - Assess/Plan/Recommendations  Wound Therapy - Clinical Statement Patient comes from home with large and painful  sacral wound, more to the right of center, partially covered with eschar, bone palpable in wound bed.  Patient  unable to provide details of home situration. sounds like he has a hospital bed, family has been performing dressing changes. Patient not clear if he gets out of bed and what equipment he has at home. Patient will benefit from Hydrotherapy to  improve wound  qualities and decrease bioburden effects.  Patient appears to be bed bound essentially , reports significant pain in the left knee with decreased ROM. Continue PT 6 x week. Initial dressing for Dakins x 5 days then santyl.  Wound Therapy - Functional Problem List total care/bedbound  Factors Delaying/Impairing Wound Healing Altered sensation;Multiple medical problems  Hydrotherapy Plan Debridement;Dressing change;Patient/family education;Pulsatile lavage with suction  Wound Therapy - Frequency 6X / week  Wound Therapy - Current Recommendations Case manager/social work  Wound Therapy - Follow Up Recommendations dressing changes by RN;Other (comment) (SNF)  Wound Therapy Goals - Improve the function of patient's integumentary system by progressing the wound(s) through the phases of wound healing by:  Decrease Necrotic Tissue to 50  Decrease Necrotic Tissue - Progress Goal set today  Increase Granulation Tissue to 50  Increase Granulation Tissue - Progress Goal set today  Patient/Family will be able to  reposition  Patient/Family Instruction Goal - Progress Goal set today  Goals/treatment plan/discharge plan were made with and agreed upon by patient/family No, Patient unable to participate in goals/treatment/discharge plan and family unavailable  Time For Goal Achievement 2 weeks  Wound  Therapy - Potential for Goals Fair     Unstageable sacral wound-04/29/2020  Wound Image     Tresa Endo PT Acute Rehabilitation Services Pager [phone] Office [phone] Mod Eval, [place], dress,x 2 [phone] gun and tip

## 2021-04-29 NOTE — Progress Notes (Signed)
PROGRESS NOTE    SENDER RUEB  MHD:[id number] DOB: Jun 21, 1950 DOA: 04/27/2021 PCP: Benito Mccreedy, MD    Chief Complaint  Patient presents with   Hematuria    Brief Narrative:  Patient 71 year old gentleman history of coronary artery disease status post CABG, carotid stenosis, PAD, CVA, hypertension, COPD, chronic pain syndrome, recent BPH with urinary retention and indwelling Foley catheter presenting from home with concerns of hematuria. Patient noted on admission to be alert and oriented to self and place only unable to provide any meaningful history.  Patient noted to have recently been hospitalized 03/25/2021-03/28/2021 with acute urinary retention secondary to BPH with AKI discharged home with indwelling Foley catheter unclear as to whether he had follow-up with urology.  Patient not on daily anticoagulation.  Patient seen in the ED noted to have fever with temp up to 100.9, leukocytosis, urinalysis with gross hematuria on initial sample and UA positive for large leukocytes, nitrite negative, many bacteria, greater than 50 RBCs concerning for UTI.  Patient also noted to have a stage IV sacral decubitus with surrounding erythema concerning for cellulitis.  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  Patient placed empirically on IV Rocephin, IV vancomycin.  Wound care RN consulted.  General surgery consulted due to concerns for osteomyelitis.   Assessment & Plan:   Principal Problem:   UTI (urinary tract infection) Active Problems:   Unstageable pressure ulcer of sacral region ([place])   HFrEF (heart failure  with reduced ejection fraction) ([facility])   Urinary retention   Sepsis ([place])   Cellulitis   History of COVID-19   History of AAA (abdominal aortic aneurysm) repair  #1 sepsis secondary to UTI with chronic indwelling Foley catheter, POA -Patient presented with criteria consistent with sepsis with fever, leukocytosis, positive urinalysis. -Foley catheter changed in the ED. -Initial Foley noted to have been placed during previous admission in early December following acute urinary retention due to BPH. -Urine cultures pending. -Continue empiric IV Rocephin.  Follow.  2.  Hematuria secondary to UTI -CT abdomen and pelvis done with no other significant source for bleeding. -Foley catheter changed with improvement with hematuria. -Hemoglobin stable at 10.5. -Follow.  3.  Sacral cellulitis/stage IV unstageable sacral ulcer -CT abdomen and pelvis concerning for possible osteomyelitis. -Sed rate ordered this morning elevated at 105, CRP at 9.4. -Wound care consulted and hydrotherapy ordered as well as dressing changes. -Consult with general surgery for further evaluation and management. -Continue empiric IV vancomycin.  Add IV Flagyl for anaerobic coverage.  4.  History of COVID-19 viral infection -Patient noted to have been positive for COVID-19 since 03/07/2021 and previously treated with steroids. -Currently asymptomatic. -Not hypoxic. -No need for airborne precautions or isolation at this time.  5.  Chronic diastolic CHF/ CAD s/p CABG -Euvolemic on examination. -Stable. -Resume home regimen Lopressor 25 mg p.o. twice daily. -Outpatient follow-up.  6.  Hypokalemia Now corrected.  7.AAA/status post aortofemoral bypass grafting -Noted on CT abdomen and pelvis unchanged. -Outpatient follow-up with vascular surgery.   DVT prophylaxis: SCDs Code Status: Full Family Communication: Updated patient.  No family at bedside Disposition:   Status is: Inpatient  The patient will  require care spanning > 2 midnights and should be moved to inpatient because: Severity of illness      Consultants:  General surgery pending Wound care RN  Procedures: CT abdomen and pelvis 04/27/2021 Chest x-ray 04/27/2021  Antimicrobials:  IV Rocephin 04/27/2021>>>>> IV vancomycin 04/27/2021>>>>>   Subjective: In the well.  No nausea or vomiting.  Continues to have pain on his bottom.  No chest pain.  No abdominal pain.  Oral intake adequate.  Sleeping okay.  Objective: Vitals:   04/28/21 2000 04/28/21 2235 04/29/21 0600 04/29/21 1300  BP:  129/74 130/77 (!) 118/55  Pulse:  84 72 (!) 56  Resp:  14 15 16   Temp:  98.9 F (37.2 C) 98.8 F (37.1 C) 98.3 F (36.8 C)  TempSrc:  Oral Oral Oral  SpO2:  94% 96% 98%  Weight: 104.8 kg     Height: 5\' 7"  (1.702 m)       Intake/Output Summary (Last 24 hours) at 04/29/2021 2032 Last data filed at 04/29/2021 1503 Gross per 24 hour  Intake 1562.03 ml  Output 2650 ml  Net -1087.97 ml    Filed Weights   04/27/21 2230 04/28/21 2000  Weight: 72 kg 104.8 kg    Examination: General: Appear in mild distress, no Rash; Oral Mucosa Clear, moist. no Abnormal Neck Mass Or lumps, Conjunctiva normal  Cardiovascular: S1 and S2 Present, no Murmur, Respiratory: good respiratory effort, Bilateral Air entry present and CTA, no Crackles, no wheezes Abdomen: Bowel Sound present, Soft and no tenderness Extremities: trace Pedal edema Neurology: alert and oriented to time, place, and person affect appropriate. no new focal deficit Gait not checked due to patient safety concerns    Data Reviewed: I have personally reviewed following labs and imaging studies  CBC: Recent Labs  Lab 04/27/21 1834 04/28/21 0817 04/29/21 0314  WBC 14.0* 12.5* 11.6*  NEUTROABS 11.0*  --  7.7  HGB 11.9* 10.5* 9.7*  HCT 37.8* 33.2* 31.7*  MCV 106.8* 105.7* 109.7*  PLT 283 248 243     Basic Metabolic Panel: Recent Labs  Lab 04/27/21 1834 04/28/21 0817  04/29/21 0314  NA 134* 132* 138  K 3.9 3.2* 4.4  CL 99 99 103  CO2 30 27 25   GLUCOSE 147* 122* 112*  BUN 28* 21 19  CREATININE 0.58* 0.48* 0.61  CALCIUM 8.4* 8.2* 8.1*  MG  --  1.8 1.9  PHOS  --   --  2.6     GFR: Estimated Creatinine Clearance: 97.8 mL/min (by C-G formula based on SCr of 0.61 mg/dL).  Liver Function Tests: Recent Labs  Lab 04/27/21 1834 04/29/21 0314  AST 29  --   ALT 27  --   ALKPHOS 108  --   BILITOT 0.6  --   PROT 6.8  --   ALBUMIN 2.2* 1.7*     CBG: No results for input(s): GLUCAP in the last 168 hours.   Recent Results (from the past 240 hour(s))  Resp Panel by RT-PCR (Flu A&B, Covid) Nasopharyngeal Swab     Status: Abnormal   Collection Time: 04/27/21  6:34 PM   Specimen: Nasopharyngeal Swab; Nasopharyngeal(NP) swabs in vial transport medium  Result Value Ref Range Status   SARS Coronavirus 2 by RT PCR POSITIVE (A) NEGATIVE Final    Comment: (NOTE) SARS-CoV-2 target nucleic acids are DETECTED.  The SARS-CoV-2 RNA is generally detectable in upper respiratory specimens during the acute phase of infection. Positive results are indicative of the presence of the identified  virus, but do not rule out bacterial infection or co-infection with other pathogens not detected by the test. Clinical correlation with patient history and other diagnostic information is necessary to determine patient infection status. The expected result is Negative.  Fact Sheet for Patients: EntrepreneurPulse.com.au  Fact Sheet for Healthcare Providers: IncredibleEmployment.be  This test is not yet approved or cleared by the [place] FDA and  has been authorized for detection and/or diagnosis of SARS-CoV-2 by FDA under an Emergency Use Authorization (EUA).  This EUA will remain in effect (meaning this test can be used) for the duration of  the COVID-19 declaration under Section 564(b)(1) of the A ct, 21 U.S.C. section  360bbb-3(b)(1), unless the authorization is terminated or revoked sooner.     Influenza A by PCR NEGATIVE NEGATIVE Final   Influenza B by PCR NEGATIVE NEGATIVE Final    Comment: (NOTE) The Xpert Xpress SARS-CoV-2/FLU/RSV plus assay is intended as an aid in the diagnosis of influenza from Nasopharyngeal swab specimens and should not be used as a sole basis for treatment. Nasal washings and aspirates are unacceptable for Xpert Xpress SARS-CoV-2/FLU/RSV testing.  Fact Sheet for Patients: EntrepreneurPulse.com.au  Fact Sheet for Healthcare Providers: IncredibleEmployment.be  This test is not yet approved or cleared by the [place] FDA and has been authorized for detection and/or diagnosis of SARS-CoV-2 by FDA under an Emergency Use Authorization (EUA). This EUA will remain in effect (meaning this test can be used) for the duration of the COVID-19 declaration under Section 564(b)(1) of the Act, 21 U.S.C. section 360bbb-3(b)(1), unless the authorization is terminated or revoked.  Performed at [facility], [place] [address], [place], [place] [address]   Urine Culture     Status: Abnormal   Collection Time: 04/27/21  9:44 PM   Specimen: Urine, Catheterized  Result Value Ref Range Status   Specimen Description   Final    URINE, CATHETERIZED Performed at [facility] [address] [place], [place] [address]    Special Requests   Final    NONE Performed at [facility], [place] [address], [place], [place] [address]    Culture MULTIPLE SPECIES PRESENT, SUGGEST RECOLLECTION (A)  Final   Report Status 04/29/2021 FINAL  Final          Radiology Studies: CT ABDOMEN PELVIS W CONTRAST  Result Date: 04/28/2021 CLINICAL DATA:  Gross hematuria.  Indwelling catheter. EXAM: CT ABDOMEN AND PELVIS WITH CONTRAST TECHNIQUE: Multidetector CT imaging of the abdomen and pelvis was performed using the  standard protocol following bolus administration of intravenous contrast. CONTRAST:  6mL OMNIPAQUE IOHEXOL 350 MG/ML SOLN COMPARISON:  CT without contrast 03/26/2021, CT with contrast 09/15/2020 FINDINGS: Lower chest: There is scattered linear scarring or atelectasis. Right lower lobe posterior basal bronchiolar thickening and multifocal bronchiolar impaction are increased since the last CT, without visible bronchopneumonia. There is cardiomegaly with sternotomy sutures and native calcific CAD. Hepatobiliary: Stable hepatic cysts. 2.8 cm single stone in the mid gallbladder lumen with mild gallbladder dilatation without wall thickening or biliary dilatation. Pancreas: Moderate diffuse fatty atrophy. No mass enhancement or ductal dilatation. Spleen: Normal. Adrenals/Urinary Tract: Stable 1.5 cm right adrenal adenoma. Normal left adrenal. Few tiny subcentimeter too small to characterize hypodensities again noted right kidney with cortical scarring anterior left lower pole. No mass enhancement, renal calculus or hydronephrosis is seen. The bladder was previously markedly dilated now is catheterized contracted and not well seen. There are no perivesical inflammatory changes but the bladder  wall could be at least mildly thickened. There are few tiny stones in the right side of the lumen. Stomach/Bowel: No dilatation or wall thickening including the appendix. Moderate fecal stasis. Scattered uncomplicated colonic diverticula. Vascular/Lymphatic: Patent aortobifemoral bypass grafting but with severe stenosis at the left femoral graft surgical anastomosis. This was seen previously. Native aortoiliac atherosclerosis is again noted with 5.4 cm thrombosed saccular infrarenal AAA , not significantly changed. Reproductive: Prominent prostate gland measuring 5.2 cm. Brachytherapy seeds and/or surgical clips in the prostate are again shown. Other: Small umbilical and inguinal fat hernias. No free air, hemorrhage or fluid.  Musculoskeletal: Skin breakdown over the dorsal aspect of the sacrococcygeal junction occurred since the prior study. Underlying osteomyelitis is not excluded. Left hip chronic arthrodesis pinning is again shown, osteopenia and degenerative changes of the lumbar spine and severe L5-S1 degenerative foraminal stenosis. IMPRESSION: 1. The bladder, previously markedly distended has been catheterized since the last CT and is now decompressed and not well seen but could be thickened. There are few tiny stones in the right side of the lumen. 2. Interval skin breakdown over the dorsal sacrococcygeal junction. Underlying osteomyelitis not excluded but no frank destructive lesion is evident. 3. Distended gallbladder with single 2.8 cm stone but no wall thickening or biliary dilatation. 4. Aortobifemoral bypass grafting with severe stenosis at the left femoral graft tie-in. 5. 5.4 cm thrombosed saccular infrarenal AAA , not significantly changed. Vascular surgery follow-up suggested unless already scheduled. 6. Multifocal bronchiolar impaction in the right lower lobe. 7. Hepatic cysts and other chronic findings discussed above. Electronically Signed   By: Telford Nab M.D.   On: 04/28/2021 00:19        Scheduled Meds:  (feeding supplement) PROSource Plus  30 mL Oral BID   Chlorhexidine Gluconate Cloth  6 each Topical Daily   [START ON 05/04/2021] collagenase   Topical Daily   feeding supplement  237 mL Oral BID BM   gabapentin  300 mg Oral BID   guaiFENesin  1,200 mg Oral BID   metoprolol tartrate  25 mg Oral BID   mometasone-formoterol  2 puff Inhalation BID   pantoprazole  40 mg Oral Daily   rosuvastatin  40 mg Oral Daily   sodium hypochlorite   Irrigation Daily   tamsulosin  0.4 mg Oral Daily   Continuous Infusions:  cefTRIAXone (ROCEPHIN)  IV 2 g (04/28/21 2246)   metronidazole 500 mg (04/29/21 0853)   vancomycin 1,750 mg (04/29/21 0007)     LOS: 1 day    Time spent: 40 minutes    Berle Mull, MD [facility]   To contact the attending provider between 7A-7P or the covering provider during after hours 7P-7A, please log into the web site [email or web address] and access using universal [place] password for that web site. If you do not have the password, please call the hospital operator.  04/29/2021, 8:32 PM

## 2021-04-29 NOTE — Consult Note (Signed)
Chris Payne [birth year]  [id number].    Requesting MD: Dr. Posey Pronto Chief Complaint/Reason for Consult: sacral decubitus ulcer  HPI:  Mr. Weisgerber is a 71 yo male with multiple comorbidities including recent BPH with an indwelling foley catheter who presented with hematuria. At admission he had leukocytosis. He also has a known sacral decubitus ulcer. A CT scan was done and showed possible osteomyelitis in the sacrum at the site of the ulcer. General surgery was consulted. Wound care has also seen the patient and recommended hydrotherapy. He was started on empiric antibiotics and WBC today is 11.6.  ROS: Review of Systems  Constitutional:  Negative for chills and fever.  Respiratory:  Negative for stridor.   Gastrointestinal:  Negative for abdominal pain.  Genitourinary:  Positive for hematuria.  Skin:        Decubitus ulcer   Family History  Problem Relation Age of Onset   Alcohol abuse Father    Cancer Sister 70       unknown primary   Diabetes Mother    Hypertension Mother    Cancer Sister 58       kidney cancer    Past Medical History:  Diagnosis Date   AAA (abdominal aortic aneurysm)    s/p repair 6/11   CAD (coronary artery disease)    s/p CABG 2/12:  LIMA to LAD, SVG to diagonal-1, SVG to ramus intermedius,, SVG to AM (Dr. Roxan Hockey) ;  b.  Myoview 10/13:  inf infarct with very mild peri-infarct ischemia, EF 49%, inf HK; c  He has severe three-vessel native coronary artery disease. Catheterization March 2015 as above   CAP (community acquired pneumonia) 02/23/2014   Carotid stenosis    a.  dopplers 7/00: LICA 17-49%;  b. Carotid dopplers 3/14:  R 0-39%, L 60-79%, repeat 6 mos   CHF (congestive heart failure) (HCC)    COPD (chronic obstructive pulmonary disease) (HCC)    GERD (gastroesophageal reflux disease)    Headache(784.0)    Hyperlipidemia    Hypertension    Hypoxia 02/23/2014   Inguinal hernia    Myocardial infarction [facility])    PCI x3   PAD (peripheral  artery disease) ([place])    ABIs 4/12:  R 0.88, L 0.92; R SFA 40%, L CFA 50%, L SFA 50-60%   Pneumonia    hx   Stroke [facility])    h/o pontine CVA   Thyroid nodule    incidental finding on carotid doppler 3/14 => thyroid U/S ordered    Past Surgical History:  Procedure Laterality Date   ABDOMINAL AORTIC ANEURYSM REPAIR  10-03-2009   CORONARY ARTERY BYPASS GRAFT  06/24/2010   LIMA to the LAD, SVG to first diagonal, SVG to ramus intermediate, SVG to acute marginal. EF 50%. 2/12   EXPLORATION POST OPERATIVE OPEN HEART     EXPLORATORY LAPAROTOMY  09/2009   ligation of lumbar arteries   EXTERNAL EAR SURGERY Left    laceration child   HIP SURGERY  40 years ago   left hip bone removal and pinning   INGUINAL HERNIA REPAIR Left 07/21/2012   Procedure: HERNIA REPAIR INGUINAL ADULT;  Surgeon: Merrie Roof, MD;  Location: [facility];  Service: General;  Laterality: Left;   INSERTION OF MESH Left 07/21/2012   Procedure: INSERTION OF MESH;  Surgeon: Merrie Roof, MD;  Location: [facility];  Service: General;  Laterality: Left;   LEFT HEART CATHETERIZATION WITH CORONARY/GRAFT ANGIOGRAM  06/21/2012   Procedure: LEFT HEART  CATHETERIZATION WITH Beatrix Fetters;  Surgeon: Minus Breeding, MD;  Location: [facility] CATH LAB;  Service: Cardiovascular;;   LEFT HEART CATHETERIZATION WITH CORONARY/GRAFT ANGIOGRAM N/A 07/04/2013   Procedure: LEFT HEART CATHETERIZATION WITH Beatrix Fetters;  Surgeon: Sinclair Grooms, MD;  Location: [facility] CATH LAB;  Service: Cardiovascular;  Laterality: N/A;   NM MYOCAR PERF WALL MOTION  12/02/2010   inferoapical defect is fixed c/w prior infarction/scar, there is minimal borderzone ischemia.    Social History:  reports that he quit smoking about 11 years ago. His smoking use included cigarettes. He has a 30.00 pack-year smoking history. He has never used smokeless tobacco. He reports that he does not drink alcohol and does not use drugs.  Allergies: No Known Allergies  Medications  Prior to Admission  Medication Sig Dispense Refill   acetaminophen (TYLENOL) 500 MG tablet Take 1,000 mg by mouth every 6 (six) hours as needed for mild pain.     albuterol (VENTOLIN HFA) 108 (90 Base) MCG/ACT inhaler Inhale 2 puffs into the lungs every 6 (six) hours as needed for wheezing or shortness of breath. 18 g 0   aspirin EC 81 MG EC tablet Take 1 tablet (81 mg total) by mouth daily.     budesonide-formoterol (SYMBICORT) 160-4.5 MCG/ACT inhaler Inhale 2 puffs into the lungs 2 (two) times daily. 1 each 2   ciprofloxacin (CIPRO) 500 MG tablet Take 500 mg by mouth 2 (two) times daily.     Ensure (ENSURE) Take 237 mLs by mouth 2 (two) times daily between meals.     furosemide (LASIX) 20 MG tablet Take 1 tablet (20 mg total) by mouth daily. 30 tablet 0   gabapentin (NEURONTIN) 300 MG capsule Take 300 mg by mouth 2 (two) times daily.     metFORMIN (GLUCOPHAGE) 500 MG tablet Take 500 mg by mouth daily with supper.     metoprolol tartrate (LOPRESSOR) 25 MG tablet Take 1 tablet (25 mg total) by mouth 2 (two) times daily. 180 tablet 0   pantoprazole (PROTONIX) 40 MG tablet Take 40 mg by mouth daily.     rosuvastatin (CRESTOR) 40 MG tablet Take 1 tablet (40 mg total) by mouth daily. 90 tablet 0   tamsulosin (FLOMAX) 0.4 MG CAPS capsule Take 1 capsule (0.4 mg total) by mouth daily. 30 capsule    Amino Acids-Protein Hydrolys (FEEDING SUPPLEMENT, PRO-STAT SUGAR FREE 64,) LIQD Take 30 mLs by mouth 2 (two) times daily. Wound health       Physical Exam: Blood pressure 130/77, pulse 72, temperature 98.8 F (37.1 C), temperature source Oral, resp. rate 15, height 5\' 7"  (1.702 m), weight 104.8 kg, SpO2 96 %. General: resting comfortably, appears stated age, no apparent distress Neurological: alert, no focal deficits, cranial nerves grossly in tact HEENT: normocephalic, atraumatic, oropharynx clear, no scleral icterus CV: extremities warm and well-perfused Respiratory: normal work of breathing,  symmetric chest wall expansion Abdomen: soft, nondistended, nontender to deep palpation. No masses or organomegaly. GU: foley in place draining clear yellow urine Psychiatric: normal mood and affect Skin: warm and dry. Decubitus ulcer overlying the sacrum has fibrinous exudate, no visible bone, no surrounding cellulitis and no purulent drainage.   Results for orders placed or performed during the hospital encounter of 04/27/21 (from the past 48 hour(s))  Comprehensive metabolic panel     Status: Abnormal   Collection Time: 04/27/21  6:34 PM  Result Value Ref Range   Sodium 134 (L) 135 - 145 mmol/L   Potassium 3.9 3.5 -  5.1 mmol/L   Chloride 99 98 - 111 mmol/L   CO2 30 22 - 32 mmol/L   Glucose, Bld 147 (H) 70 - 99 mg/dL    Comment: Glucose reference range applies only to samples taken after fasting for at least 8 hours.   BUN 28 (H) 8 - 23 mg/dL   Creatinine, Ser 0.58 (L) 0.61 - 1.24 mg/dL   Calcium 8.4 (L) 8.9 - 10.3 mg/dL   Total Protein 6.8 6.5 - 8.1 g/dL   Albumin 2.2 (L) 3.5 - 5.0 g/dL   AST 29 15 - 41 U/L   ALT 27 0 - 44 U/L   Alkaline Phosphatase 108 38 - 126 U/L   Total Bilirubin 0.6 0.3 - 1.2 mg/dL   GFR, Estimated >60 >60 mL/min    Comment: (NOTE) Calculated using the CKD-EPI Creatinine Equation (2021)    Anion gap 5 5 - 15    Comment: Performed at [facility], [place] [address] [place], [place] [address]  CBC with Differential     Status: Abnormal   Collection Time: 04/27/21  6:34 PM  Result Value Ref Range   WBC 14.0 (H) 4.0 - 10.5 K/uL   RBC 3.54 (L) 4.22 - 5.81 MIL/uL   Hemoglobin 11.9 (L) 13.0 - 17.0 g/dL   HCT 37.8 (L) 39.0 - 52.0 %   MCV 106.8 (H) 80.0 - 100.0 fL   MCH 33.6 26.0 - 34.0 pg   MCHC 31.5 30.0 - 36.0 g/dL   RDW 15.2 11.5 - 15.5 %   Platelets 283 150 - 400 K/uL   nRBC 0.0 0.0 - 0.2 %   Neutrophils Relative % 79 %   Neutro Abs 11.0 (H) 1.7 - 7.7 K/uL   Lymphocytes Relative 12 %   Lymphs Abs 1.7 0.7 - 4.0 K/uL   Monocytes  Relative 9 %   Monocytes Absolute 1.2 (H) 0.1 - 1.0 K/uL   Eosinophils Relative 0 %   Eosinophils Absolute 0.0 0.0 - 0.5 K/uL   Basophils Relative 0 %   Basophils Absolute 0.0 0.0 - 0.1 K/uL   Immature Granulocytes 0 %   Abs Immature Granulocytes 0.06 0.00 - 0.07 K/uL    Comment: Performed at [facility], [place] [address], [place], [place] [address]  Resp Panel by RT-PCR (Flu A&B, Covid) Nasopharyngeal Swab     Status: Abnormal   Collection Time: 04/27/21  6:34 PM   Specimen: Nasopharyngeal Swab; Nasopharyngeal(NP) swabs in vial transport medium  Result Value Ref Range   SARS Coronavirus 2 by RT PCR POSITIVE (A) NEGATIVE    Comment: (NOTE) SARS-CoV-2 target nucleic acids are DETECTED.  The SARS-CoV-2 RNA is generally detectable in upper respiratory specimens during the acute phase of infection. Positive results are indicative of the presence of the identified virus, but do not rule out bacterial infection or co-infection with other pathogens not detected by the test. Clinical correlation with patient history and other diagnostic information is necessary to determine patient infection status. The expected result is Negative.  Fact Sheet for Patients: EntrepreneurPulse.com.au  Fact Sheet for Healthcare Providers: IncredibleEmployment.be  This test is not yet approved or cleared by the [place] FDA and  has been authorized for detection and/or diagnosis of SARS-CoV-2 by FDA under an Emergency Use Authorization (EUA).  This EUA will remain in effect (meaning this test can be used) for the duration of  the COVID-19 declaration under Section 564(b)(1) of the A ct, 21 U.S.C. section 360bbb-3(b)(1), unless the  authorization is terminated or revoked sooner.     Influenza A by PCR NEGATIVE NEGATIVE   Influenza B by PCR NEGATIVE NEGATIVE    Comment: (NOTE) The Xpert Xpress SARS-CoV-2/FLU/RSV plus assay is intended as an aid in  the diagnosis of influenza from Nasopharyngeal swab specimens and should not be used as a sole basis for treatment. Nasal washings and aspirates are unacceptable for Xpert Xpress SARS-CoV-2/FLU/RSV testing.  Fact Sheet for Patients: EntrepreneurPulse.com.au  Fact Sheet for Healthcare Providers: IncredibleEmployment.be  This test is not yet approved or cleared by the [place] FDA and has been authorized for detection and/or diagnosis of SARS-CoV-2 by FDA under an Emergency Use Authorization (EUA). This EUA will remain in effect (meaning this test can be used) for the duration of the COVID-19 declaration under Section 564(b)(1) of the Act, 21 U.S.C. section 360bbb-3(b)(1), unless the authorization is terminated or revoked.  Performed at [facility], [place] [address] [place], [place] [address]   Urinalysis, Routine w reflex microscopic Nasopharyngeal Swab     Status: Abnormal   Collection Time: 04/27/21  6:34 PM  Result Value Ref Range   Color, Urine RED (A) YELLOW   APPearance TURBID (A) CLEAR   Specific Gravity, Urine 1.020 1.005 - 1.030   pH 6.5 5.0 - 8.0   Glucose, UA (A) NEGATIVE mg/dL    TEST NOT REPORTED DUE TO COLOR INTERFERENCE OF URINE PIGMENT   Hgb urine dipstick (A) NEGATIVE    TEST NOT REPORTED DUE TO COLOR INTERFERENCE OF URINE PIGMENT   Bilirubin Urine (A) NEGATIVE    TEST NOT REPORTED DUE TO COLOR INTERFERENCE OF URINE PIGMENT   Ketones, ur (A) NEGATIVE mg/dL    TEST NOT REPORTED DUE TO COLOR INTERFERENCE OF URINE PIGMENT   Protein, ur (A) NEGATIVE mg/dL    TEST NOT REPORTED DUE TO COLOR INTERFERENCE OF URINE PIGMENT   Nitrite (A) NEGATIVE    TEST NOT REPORTED DUE TO COLOR INTERFERENCE OF URINE PIGMENT   Leukocytes,Ua (A) NEGATIVE    TEST NOT REPORTED DUE TO COLOR INTERFERENCE OF URINE PIGMENT   RBC / HPF >50 (H) 0 - 5 RBC/hpf   WBC, UA 6-10 0 - 5 WBC/hpf   Bacteria, UA MANY (A) NONE SEEN   Mucus  PRESENT     Comment: Performed at [facility], [place] [address], [place], [place] [address]  Type and screen     Status: None   Collection Time: 04/27/21  6:34 PM  Result Value Ref Range   ABO/RH(D) A POS    Antibody Screen NEG    Sample Expiration      04/30/2021,2359 Performed at [facility], [place] [address] [place], [place] [address]   Urinalysis, Routine w reflex microscopic Urine, Catheterized     Status: Abnormal   Collection Time: 04/27/21  9:00 PM  Result Value Ref Range   Color, Urine AMBER (A) YELLOW   APPearance CLOUDY (A) CLEAR   Specific Gravity, Urine 1.016 1.005 - 1.030   pH 6.0 5.0 - 8.0   Glucose, UA NEGATIVE NEGATIVE mg/dL   Hgb urine dipstick LARGE (A) NEGATIVE   Bilirubin Urine NEGATIVE NEGATIVE   Ketones, ur NEGATIVE NEGATIVE mg/dL   Protein, ur 100 (A) NEGATIVE mg/dL   Nitrite NEGATIVE NEGATIVE   Leukocytes,Ua LARGE (A) NEGATIVE   RBC / HPF >50 (H) 0 - 5 RBC/hpf   WBC, UA 11-20 0 - 5 WBC/hpf   Bacteria, UA MANY (A) NONE SEEN  Mucus PRESENT    Budding Yeast PRESENT     Comment: Performed at [facility], [place] [address], [place], [place] [address]  CBC     Status: Abnormal   Collection Time: 04/28/21  8:17 AM  Result Value Ref Range   WBC 12.5 (H) 4.0 - 10.5 K/uL   RBC 3.14 (L) 4.22 - 5.81 MIL/uL   Hemoglobin 10.5 (L) 13.0 - 17.0 g/dL   HCT 33.2 (L) 39.0 - 52.0 %   MCV 105.7 (H) 80.0 - 100.0 fL   MCH 33.4 26.0 - 34.0 pg   MCHC 31.6 30.0 - 36.0 g/dL   RDW 15.3 11.5 - 15.5 %   Platelets 248 150 - 400 K/uL   nRBC 0.0 0.0 - 0.2 %    Comment: Performed at [facility], [place] [address] [place], [place] [address]  Basic metabolic panel     Status: Abnormal   Collection Time: 04/28/21  8:17 AM  Result Value Ref Range   Sodium 132 (L) 135 - 145 mmol/L   Potassium 3.2 (L) 3.5 - 5.1 mmol/L    Comment: DELTA CHECK NOTED   Chloride 99 98 - 111 mmol/L   CO2 27 22 - 32 mmol/L    Glucose, Bld 122 (H) 70 - 99 mg/dL    Comment: Glucose reference range applies only to samples taken after fasting for at least 8 hours.   BUN 21 8 - 23 mg/dL   Creatinine, Ser 0.48 (L) 0.61 - 1.24 mg/dL   Calcium 8.2 (L) 8.9 - 10.3 mg/dL   GFR, Estimated >60 >60 mL/min    Comment: (NOTE) Calculated using the CKD-EPI Creatinine Equation (2021)    Anion gap 6 5 - 15    Comment: Performed at [facility], [place] [address] [place], [place] [address]  Magnesium     Status: None   Collection Time: 04/28/21  8:17 AM  Result Value Ref Range   Magnesium 1.8 1.7 - 2.4 mg/dL    Comment: Performed at [facility], [place] [address], [place], [place] [address]  Sedimentation rate     Status: Abnormal   Collection Time: 04/28/21  8:17 AM  Result Value Ref Range   Sed Rate 105 (H) 0 - 16 mm/hr    Comment: Performed at [facility], [place] [address], [place], [place] [address]  C-reactive protein     Status: Abnormal   Collection Time: 04/28/21 11:05 AM  Result Value Ref Range   CRP 9.4 (H) <1.0 mg/dL    Comment: Performed at [place] [address], [place], [place] [address]  CBC with Differential/Platelet     Status: Abnormal   Collection Time: 04/29/21  3:14 AM  Result Value Ref Range   WBC 11.6 (H) 4.0 - 10.5 K/uL   RBC 2.89 (L) 4.22 - 5.81 MIL/uL   Hemoglobin 9.7 (L) 13.0 - 17.0 g/dL   HCT 31.7 (L) 39.0 - 52.0 %   MCV 109.7 (H) 80.0 - 100.0 fL   MCH 33.6 26.0 - 34.0 pg   MCHC 30.6 30.0 - 36.0 g/dL   RDW 15.2 11.5 - 15.5 %   Platelets 243 150 - 400 K/uL   nRBC 0.0 0.0 - 0.2 %   Neutrophils Relative % 66 %   Neutro Abs 7.7 1.7 - 7.7 K/uL   Lymphocytes Relative 22 %   Lymphs Abs 2.6 0.7 - 4.0 K/uL   Monocytes Relative 11 %  Monocytes Absolute 1.2 (H) 0.1 - 1.0 K/uL   Eosinophils Relative 0 %   Eosinophils Absolute 0.0 0.0 - 0.5 K/uL   Basophils Relative 0 %   Basophils Absolute 0.0 0.0 - 0.1 K/uL   Immature  Granulocytes 1 %   Abs Immature Granulocytes 0.06 0.00 - 0.07 K/uL    Comment: Performed at [facility], [place] [address] [place], [place] [address]  Renal function panel     Status: Abnormal   Collection Time: 04/29/21  3:14 AM  Result Value Ref Range   Sodium 138 135 - 145 mmol/L   Potassium 4.4 3.5 - 5.1 mmol/L    Comment: DELTA CHECK NOTED   Chloride 103 98 - 111 mmol/L   CO2 25 22 - 32 mmol/L   Glucose, Bld 112 (H) 70 - 99 mg/dL    Comment: Glucose reference range applies only to samples taken after fasting for at least 8 hours.   BUN 19 8 - 23 mg/dL   Creatinine, Ser 0.61 0.61 - 1.24 mg/dL   Calcium 8.1 (L) 8.9 - 10.3 mg/dL   Phosphorus 2.6 2.5 - 4.6 mg/dL   Albumin 1.7 (L) 3.5 - 5.0 g/dL   GFR, Estimated >60 >60 mL/min    Comment: (NOTE) Calculated using the CKD-EPI Creatinine Equation (2021)    Anion gap 10 5 - 15    Comment: Performed at [facility], [place] [address], [place], [place] [address]  Magnesium     Status: None   Collection Time: 04/29/21  3:14 AM  Result Value Ref Range   Magnesium 1.9 1.7 - 2.4 mg/dL    Comment: Performed at [facility], [place] [address] [place],  [address]   CT ABDOMEN PELVIS W CONTRAST  Result Date: 04/28/2021 CLINICAL DATA:  Gross hematuria.  Indwelling catheter. EXAM: CT ABDOMEN AND PELVIS WITH CONTRAST TECHNIQUE: Multidetector CT imaging of the abdomen and pelvis was performed using the standard protocol following bolus administration of intravenous contrast. CONTRAST:  77mL OMNIPAQUE IOHEXOL 350 MG/ML SOLN COMPARISON:  CT without contrast 03/26/2021, CT with contrast 09/15/2020 FINDINGS: Lower chest: There is scattered linear scarring or atelectasis. Right lower lobe posterior basal bronchiolar thickening and multifocal bronchiolar impaction are increased since the last CT, without visible bronchopneumonia. There is cardiomegaly with sternotomy sutures and native calcific CAD.  Hepatobiliary: Stable hepatic cysts. 2.8 cm single stone in the mid gallbladder lumen with mild gallbladder dilatation without wall thickening or biliary dilatation. Pancreas: Moderate diffuse fatty atrophy. No mass enhancement or ductal dilatation. Spleen: Normal. Adrenals/Urinary Tract: Stable 1.5 cm right adrenal adenoma. Normal left adrenal. Few tiny subcentimeter too small to characterize hypodensities again noted right kidney with cortical scarring anterior left lower pole. No mass enhancement, renal calculus or hydronephrosis is seen. The bladder was previously markedly dilated now is catheterized contracted and not well seen. There are no perivesical inflammatory changes but the bladder wall could be at least mildly thickened. There are few tiny stones in the right side of the lumen. Stomach/Bowel: No dilatation or wall thickening including the appendix. Moderate fecal stasis. Scattered uncomplicated colonic diverticula. Vascular/Lymphatic: Patent aortobifemoral bypass grafting but with severe stenosis at the left femoral graft surgical anastomosis. This was seen previously. Native aortoiliac atherosclerosis is again noted with 5.4 cm thrombosed saccular infrarenal AAA , not significantly changed. Reproductive: Prominent prostate gland measuring 5.2 cm. Brachytherapy seeds and/or surgical clips in the prostate are again shown. Other: Small umbilical and inguinal fat hernias. No free  air, hemorrhage or fluid. Musculoskeletal: Skin breakdown over the dorsal aspect of the sacrococcygeal junction occurred since the prior study. Underlying osteomyelitis is not excluded. Left hip chronic arthrodesis pinning is again shown, osteopenia and degenerative changes of the lumbar spine and severe L5-S1 degenerative foraminal stenosis. IMPRESSION: 1. The bladder, previously markedly distended has been catheterized since the last CT and is now decompressed and not well seen but could be thickened. There are few tiny stones  in the right side of the lumen. 2. Interval skin breakdown over the dorsal sacrococcygeal junction. Underlying osteomyelitis not excluded but no frank destructive lesion is evident. 3. Distended gallbladder with single 2.8 cm stone but no wall thickening or biliary dilatation. 4. Aortobifemoral bypass grafting with severe stenosis at the left femoral graft tie-in. 5. 5.4 cm thrombosed saccular infrarenal AAA , not significantly changed. Vascular surgery follow-up suggested unless already scheduled. 6. Multifocal bronchiolar impaction in the right lower lobe. 7. Hepatic cysts and other chronic findings discussed above. Electronically Signed   By: Telford Nab M.D.   On: 04/28/2021 00:19   DG Chest Port 1 View  Result Date: 04/27/2021 CLINICAL DATA:  Pain. EXAM: PORTABLE CHEST 1 VIEW COMPARISON:  Chest x-ray 03/26/2021. FINDINGS: The aorta is tortuous, unchanged. The heart is enlarged, unchanged. The lungs and costophrenic angles are clear. There is no pneumothorax. No acute fractures are seen. IMPRESSION: 1. No acute cardiopulmonary process. 2. Stable cardiomegaly. Electronically Signed   By: Ronney Asters M.D.   On: 04/27/2021 18:24      Assessment/Plan 71 yo male admitted with hematuria, general surgery consulted for a sacral decubitus ulcer. There are no signs of cellulitis on exam and there is no eschar over the wound. No indication for surgical debridement at this time. Agree with wound care recommendations of hydrotherapy and WTD dressings to promote gentle ongoing debridement. Surgery will sign off at this time, please call with any further questions or concerns.   Michaelle Birks, MD [facility] Surgery General, Hepatobiliary and Pancreatic Surgery 04/29/21 10:45 AM

## 2021-04-30 DIAGNOSIS — I5020 Unspecified systolic (congestive) heart failure: Secondary | ICD-10-CM | POA: Diagnosis not present

## 2021-04-30 DIAGNOSIS — L03317 Cellulitis of buttock: Secondary | ICD-10-CM | POA: Diagnosis not present

## 2021-04-30 DIAGNOSIS — T83511A Infection and inflammatory reaction due to indwelling urethral catheter, initial encounter: Secondary | ICD-10-CM | POA: Diagnosis not present

## 2021-04-30 DIAGNOSIS — R319 Hematuria, unspecified: Secondary | ICD-10-CM | POA: Diagnosis not present

## 2021-04-30 LAB — CBC WITH DIFFERENTIAL/PLATELET
Abs Immature Granulocytes: 0.03 10*3/uL (ref 0.00–0.07)
Basophils Absolute: 0 10*3/uL (ref 0.0–0.1)
Basophils Relative: 0 %
Eosinophils Absolute: 0.1 10*3/uL (ref 0.0–0.5)
Eosinophils Relative: 1 %
HCT: 34.3 % — ABNORMAL LOW (ref 39.0–52.0)
Hemoglobin: 10.8 g/dL — ABNORMAL LOW (ref 13.0–17.0)
Immature Granulocytes: 0 %
Lymphocytes Relative: 25 %
Lymphs Abs: 1.9 10*3/uL (ref 0.7–4.0)
MCH: 33.3 pg (ref 26.0–34.0)
MCHC: 31.5 g/dL (ref 30.0–36.0)
MCV: 105.9 fL — ABNORMAL HIGH (ref 80.0–100.0)
Monocytes Absolute: 0.7 10*3/uL (ref 0.1–1.0)
Monocytes Relative: 9 %
Neutro Abs: 4.9 10*3/uL (ref 1.7–7.7)
Neutrophils Relative %: 65 %
Platelets: 261 10*3/uL (ref 150–400)
RBC: 3.24 MIL/uL — ABNORMAL LOW (ref 4.22–5.81)
RDW: 14.9 % (ref 11.5–15.5)
WBC: 7.6 10*3/uL (ref 4.0–10.5)
nRBC: 0 % (ref 0.0–0.2)

## 2021-04-30 LAB — RENAL FUNCTION PANEL
Albumin: 1.7 g/dL — ABNORMAL LOW (ref 3.5–5.0)
Anion gap: 4 — ABNORMAL LOW (ref 5–15)
BUN: 20 mg/dL (ref 8–23)
CO2: 23 mmol/L (ref 22–32)
Calcium: 7.9 mg/dL — ABNORMAL LOW (ref 8.9–10.3)
Chloride: 109 mmol/L (ref 98–111)
Creatinine, Ser: 0.51 mg/dL — ABNORMAL LOW (ref 0.61–1.24)
GFR, Estimated: >60 mL/min (ref >60)
Glucose, Bld: 89 mg/dL (ref 70–99)
Phosphorus: 2.5 mg/dL (ref 2.5–4.6)
Potassium: 3.5 mmol/L (ref 3.5–5.1)
Sodium: 136 mmol/L (ref 135–145)

## 2021-04-30 LAB — MAGNESIUM: Magnesium: 2 mg/dL (ref 1.7–2.4)

## 2021-04-30 MED ORDER — SIMETHICONE 80 MG PO CHEW
160.0000 mg | CHEWABLE_TABLET | Freq: Four times a day (QID) | ORAL | Status: DC
  Administered 2021-04-30 – 2021-05-03 (×9): 160 mg via ORAL
  Filled 2021-04-30 (×10): qty 2

## 2021-04-30 NOTE — Consult Note (Addendum)
[facility] Nurse wound follow up Seen today at the request of PT during hydrotherapy session. Patient with two distinct areas of pressure injury to the sacrum: from 12- 6 o'clock, it is skin-level maroon/purple discoloration.  From 6-12 o'clock it is a full thickness defect with a nonviable base (Unstageable). The skin level DTPI is exclusively painful and the patient cannot tolerate palpation or really, hydrotherapy. I will recommend that we place an antimicrobial nonadherent over this tissue in an attempt to facilitate drying and donate antimicrobial properties. Hydrotherapy will continue daily (6 days a week) this week and then decrease to three times a week next week after the applications of collagenase (Santyl).  Note: Fecal incontinence continues.  Patient stools during the treatment today.  Patient is on a mattress replacement with low air loss feature.  I will plan to assess on Friday, 05/02/21 with PT during hydrotherapy session. The expertise and input of the acute rehab professionals in the care of this patient is appreciated.  [facility] nursing team will follow, and will remain available to this patient, the nursing and medical teams.   Thanks, Maudie Flakes, MSN, RN, [place], Arther Abbott  Pager# [phone]

## 2021-04-30 NOTE — Clinical Social Work Note (Signed)
CSW left VM for patient's son-in-law/POA, [name], and requested call back.

## 2021-04-30 NOTE — Progress Notes (Signed)
PROGRESS NOTE    Chris Payne  ACZ:[id number] DOB: 1951/02/05 DOA: 04/27/2021 PCP: Benito Mccreedy, MD    Chief Complaint  Patient presents with   Hematuria    Brief Narrative:  Patient 71 year old gentleman history of coronary artery disease status post CABG, carotid stenosis, PAD, CVA, hypertension, COPD, chronic pain syndrome, recent BPH with urinary retention and indwelling Foley catheter presenting from home with concerns of hematuria. Patient noted on admission to be alert and oriented to self and place only unable to provide any meaningful history.  Patient noted to have recently been hospitalized 03/25/2021-03/28/2021 with acute urinary retention secondary to BPH with AKI discharged home with indwelling Foley catheter unclear as to whether he had follow-up with urology.  Patient not on daily anticoagulation.  Patient seen in the ED noted to have fever with temp up to 100.9, leukocytosis, urinalysis with gross hematuria on initial sample and UA positive for large leukocytes, nitrite negative, many bacteria, greater than 50 RBCs concerning for UTI.  Patient also noted to have a stage IV sacral decubitus with surrounding erythema concerning for cellulitis.  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  Patient placed empirically on IV Rocephin, IV vancomycin.  Wound care RN consulted.  General surgery consulted due to concerns for osteomyelitis.   Assessment & Plan:   Principal Problem:   UTI (urinary tract infection) Active Problems:   Unstageable pressure ulcer of sacral region ([place])   HFrEF (heart failure  with reduced ejection fraction) ([facility])   Urinary retention   Sepsis ([place])   Cellulitis   History of COVID-19   History of AAA (abdominal aortic aneurysm) repair  #1 sepsis secondary to UTI with chronic indwelling Foley catheter, POA -Patient presented with criteria consistent with sepsis with fever, leukocytosis, positive urinalysis. -Foley catheter changed in the ED. -Initial Foley noted to have been placed during previous admission in early December following acute urinary retention due to BPH. -Urine cultures with multiple species. -Continue empiric IV Rocephin and transition to oral antibiotics in the next 24 hours, to treat for total of 5 to 7 days..  Follow.  2.  Hematuria secondary to UTI -CT abdomen and pelvis done with no other significant source for bleeding. -Foley catheter changed with improvement with hematuria. -Hemoglobin stable at 10.8 -Follow.  3.  Sacral cellulitis/stage IV unstageable sacral ulcer -CT abdomen and pelvis concerning for possible osteomyelitis. -Sed rate ordered this morning elevated at 105, CRP at 9.4. -Wound care consulted and hydrotherapy ordered as well as dressing changes. -Patient seen in consultation by general surgery who has assessed the wounds and feels no surgical debridement necessary at this time and agree with wound care recommendations of hydrotherapy and wet-to-dry dressing changes to promote gentle ongoing debridement.   -General surgery has signed off.  -Continue empiric IV vancomycin and IV Flagyl for anaerobic coverage.  4.  History of COVID-19 viral infection -Patient noted to have been positive for COVID-19 since 03/07/2021 and previously treated with steroids. -Currently asymptomatic. -Not hypoxic. -No need for airborne precautions or isolation at this time.  5.  Chronic diastolic CHF/ CAD s/p CABG -Euvolemic on examination. -Continue home regimen Lopressor, statin.   -  Outpatient follow-up stable.  6.   Hypokalemia -Magnesium at 2.0, potassium at 3.5. -Repeat labs in the morning.  7.AAA/status post aortofemoral bypass grafting -Noted on CT abdomen and pelvis unchanged. -Outpatient follow-up with vascular surgery.  8.  Stage IV sacral ulcer/unstageable ulcer, heel ulcers POA -Continue current dressing changes and hydrotherapy per wound care. Pressure Injury 03/27/21 Sacrum Unstageable - Full thickness tissue loss in which the base of the injury is covered by slough (yellow, tan, gray, green or brown) and/or eschar (tan, brown or black) in the wound bed. necrotic tissue and slough (Active)  03/27/21 1345  Location: Sacrum  Location Orientation:   Staging: Unstageable - Full thickness tissue loss in which the base of the injury is covered by slough (yellow, tan, gray, green or brown) and/or eschar (tan, brown or black) in the wound bed.  Wound Description (Comments): necrotic tissue and slough  Present on Admission: Yes     Pressure Injury 04/28/21 Foot Left;Posterior Stage 3 -  Full thickness tissue loss. Subcutaneous fat may be visible but bone, tendon or muscle are NOT exposed. prevalon boots and foam heel protectors intact (Active)  04/28/21 2000  Location: Foot (lateral heel)  Location Orientation: Left;Posterior  Staging: Stage 3 -  Full thickness tissue loss. Subcutaneous fat may be visible but bone, tendon or muscle are NOT exposed.  Wound Description (Comments): prevalon boots and foam heel protectors intact  Present on Admission: Yes     Pressure Injury 04/28/21 Heel Left Deep Tissue Pressure Injury - Purple or maroon localized area of discolored intact skin or blood-filled blister due to damage of underlying soft tissue from pressure and/or shear. blood filled blister left heel (Active)  04/28/21 2000  Location: Heel  Location Orientation: Left  Staging: Deep Tissue Pressure Injury - Purple or maroon localized area of discolored intact skin or blood-filled blister due to damage  of underlying soft tissue from pressure and/or shear.  Wound Description (Comments): blood filled blister left heel  Present on Admission: Yes     Pressure Injury 04/29/21 Sacrum Medial Unstageable - Full thickness tissue loss in which the base of the injury is covered by slough (yellow, tan, gray, green or brown) and/or eschar (tan, brown or black) in the wound bed. PT only, large painfu wound (Active)  04/29/21 1130  Location: Sacrum  Location Orientation: Medial  Staging: Unstageable - Full thickness tissue loss in which the base of the injury is covered by slough (yellow, tan, gray, green or brown) and/or eschar (tan, brown or black) in the wound bed.  Wound Description (Comments): PT only, large painfu wound  Present on Admission: Yes         DVT prophylaxis: SCDs Code Status: Full Family Communication: Updated patient.  No family at bedside Disposition:   Status is: Inpatient  The patient will require care spanning > 2 midnights and should be moved to inpatient because: Severity of illness      Consultants:  General surgery: Dr. Zenia Resides 04/29/2021 Wound care RN  Procedures: CT abdomen and pelvis 04/27/2021 Chest x-ray 04/27/2021  Antimicrobials:  IV Rocephin 04/27/2021>>>>> IV vancomycin 04/27/2021>>>>> IV Flagyl 04/29/2021>>>>   Subjective: Patient laying in bed.  Alert and oriented to self place and time.  No chest pain.  No shortness of breath.  No abdominal pain.  Overall feeling better.   Objective: Vitals:   04/29/21 0600 04/29/21 1300 04/29/21 2145 04/30/21 0532  BP: 130/77 (!) 118/55 134/69 130/64  Pulse: 72 (!) 56 72 64  Resp:  15 16 17 17   Temp: 98.8 F (37.1 C) 98.3 F (36.8 C) 99.5 F (37.5 C) 97.9 F (36.6 C)  TempSrc: Oral Oral Oral Oral  SpO2: 96% 98% 96% 95%  Weight:      Height:        Intake/Output Summary (Last 24 hours) at 04/30/2021 1103 Last data filed at 04/30/2021 5366 Gross per 24 hour  Intake 1185.95 ml  Output 900 ml  Net 285.95 ml     Filed Weights   04/27/21 2230 04/28/21 2000  Weight: 72 kg 104.8 kg    Examination:  General exam: NAD Respiratory system: Clear to auscultation bilaterally anterior lung fields.  No wheezes, no crackles, no rhonchi.  Normal respiratory effort.  Speaking in full sentences.   Cardiovascular system: Regular rate and rhythm no murmurs rubs or gallops.  No JVD.  No lower extremity edema.  Gastrointestinal system: Abdomen is soft, nontender, nondistended, positive bowel sounds.  No rebound.  No guarding.   Genitourinary: Indwelling Foley catheter with clear urine. Central nervous system: Alert and oriented. No focal neurological deficits. Extremities: Symmetric 5 x 5 power. Skin: Large deep sacral wound stage IV with bone exposed with surrounding erythema. Psychiatry: Judgement and insight appear normal. Mood & affect appropriate.     Data Reviewed: I have personally reviewed following labs and imaging studies  CBC: Recent Labs  Lab 04/27/21 1834 04/28/21 0817 04/29/21 0314 04/30/21 0833  WBC 14.0* 12.5* 11.6* 7.6  NEUTROABS 11.0*  --  7.7 4.9  HGB 11.9* 10.5* 9.7* 10.8*  HCT 37.8* 33.2* 31.7* 34.3*  MCV 106.8* 105.7* 109.7* 105.9*  PLT 283 248 243 261     Basic Metabolic Panel: Recent Labs  Lab 04/27/21 1834 04/28/21 0817 04/29/21 0314 04/30/21 0833  NA 134* 132* 138 136  K 3.9 3.2* 4.4 3.5  CL 99 99 103 109  CO2 30 27 25 23   GLUCOSE 147* 122* 112* 89  BUN 28* 21 19 20   CREATININE 0.58* 0.48* 0.61 0.51*  CALCIUM 8.4* 8.2* 8.1* 7.9*  MG  --  1.8 1.9 2.0  PHOS  --   --  2.6 2.5     GFR: Estimated Creatinine Clearance: 97.8 mL/min (A) (by C-G formula based on SCr of 0.51 mg/dL (L)).  Liver Function Tests: Recent Labs  Lab 04/27/21 1834 04/29/21 0314 04/30/21 0833  AST 29  --   --   ALT 27  --   --   ALKPHOS 108  --   --   BILITOT 0.6  --   --   PROT 6.8  --   --   ALBUMIN 2.2* 1.7* 1.7*     CBG: No results for input(s): GLUCAP in the last 168  hours.   Recent Results (from the past 240 hour(s))  Resp Panel by RT-PCR (Flu A&B, Covid) Nasopharyngeal Swab     Status: Abnormal   Collection Time: 04/27/21  6:34 PM   Specimen: Nasopharyngeal Swab; Nasopharyngeal(NP) swabs in vial transport medium  Result Value Ref Range Status   SARS Coronavirus 2 by RT PCR POSITIVE (A) NEGATIVE Final    Comment: (NOTE) SARS-CoV-2 target nucleic acids are DETECTED.  The SARS-CoV-2 RNA is generally detectable in upper respiratory specimens during the acute phase of infection. Positive results are indicative of the presence of the identified virus, but do not rule out bacterial infection or co-infection with other pathogens not detected by the test. Clinical correlation with patient history and other diagnostic information is necessary to determine  patient infection status. The expected result is Negative.  Fact Sheet for Patients: EntrepreneurPulse.com.au  Fact Sheet for Healthcare Providers: IncredibleEmployment.be  This test is not yet approved or cleared by the [place] FDA and  has been authorized for detection and/or diagnosis of SARS-CoV-2 by FDA under an Emergency Use Authorization (EUA).  This EUA will remain in effect (meaning this test can be used) for the duration of  the COVID-19 declaration under Section 564(b)(1) of the A ct, 21 U.S.C. section 360bbb-3(b)(1), unless the authorization is terminated or revoked sooner.     Influenza A by PCR NEGATIVE NEGATIVE Final   Influenza B by PCR NEGATIVE NEGATIVE Final    Comment: (NOTE) The Xpert Xpress SARS-CoV-2/FLU/RSV plus assay is intended as an aid in the diagnosis of influenza from Nasopharyngeal swab specimens and should not be used as a sole basis for treatment. Nasal washings and aspirates are unacceptable for Xpert Xpress SARS-CoV-2/FLU/RSV testing.  Fact Sheet for Patients: EntrepreneurPulse.com.au  Fact Sheet  for Healthcare Providers: IncredibleEmployment.be  This test is not yet approved or cleared by the [place] FDA and has been authorized for detection and/or diagnosis of SARS-CoV-2 by FDA under an Emergency Use Authorization (EUA). This EUA will remain in effect (meaning this test can be used) for the duration of the COVID-19 declaration under Section 564(b)(1) of the Act, 21 U.S.C. section 360bbb-3(b)(1), unless the authorization is terminated or revoked.  Performed at [facility], [place] [address] [place], [place] [address]   Urine Culture     Status: Abnormal   Collection Time: 04/27/21  9:44 PM   Specimen: Urine, Catheterized  Result Value Ref Range Status   Specimen Description   Final    URINE, CATHETERIZED Performed at [facility] [address], [place], [place] [address]    Special Requests   Final    NONE Performed at [facility], [place] [address], [place], [place] [address]    Culture MULTIPLE SPECIES PRESENT, SUGGEST RECOLLECTION (A)  Final   Report Status 04/29/2021 FINAL  Final          Radiology Studies: No results found.      Scheduled Meds:  (feeding supplement) PROSource Plus  30 mL Oral BID   Chlorhexidine Gluconate Cloth  6 each Topical Daily   [START ON 05/04/2021] collagenase   Topical Daily   feeding supplement  237 mL Oral BID BM   gabapentin  300 mg Oral BID   guaiFENesin  1,200 mg Oral BID   metoprolol tartrate  25 mg Oral BID   mometasone-formoterol  2 puff Inhalation BID   pantoprazole  40 mg Oral Daily   rosuvastatin  40 mg Oral Daily   sodium hypochlorite   Irrigation Daily   tamsulosin  0.4 mg Oral Daily   Continuous Infusions:  cefTRIAXone (ROCEPHIN)  IV 2 g (04/29/21 2239)   metronidazole 500 mg (04/30/21 1015)   vancomycin 1,750 mg (04/29/21 2314)     LOS: 2 days    Time spent: 35 minutes    Irine Seal, MD [facility]   To contact the attending provider between 7A-7P or the covering provider during after hours 7P-7A, please log into the web site [email or web address] and access using universal [place] password for that web site. If you do not have the password, please call the hospital operator.  04/30/2021, 11:03 AM

## 2021-04-30 NOTE — Progress Notes (Addendum)
°   04/30/21 1400 Hydrotherapy note  Subjective Assessment  Subjective the water is cold  Patient and Family Stated Goals agreed with PLS  Date of Onset  (present)  Prior Treatments per pt, family changed dress.  Evaluation and Treatment  Evaluation and Treatment Procedures Explained to Patient/Family Yes  Evaluation and Treatment Procedures agreed to  Pressure Injury 04/29/21 Sacrum Medial Unstageable - Full thickness tissue loss in which the base of the injury is covered by slough (yellow, tan, gray, green or brown) and/or eschar (tan, brown or black) in the wound bed. PT only, large painfu wound  Date First Assessed/Time First Assessed: 04/29/21 1130   Location: Sacrum  Location Orientation: Medial  Staging: Unstageable - Full thickness tissue loss in which the base of the injury is covered by slough (yellow, tan, gray, green or brown) and/or ...  Dressing Type Dakin's-soaked gauze;Foam - Lift dressing to assess site every shift;Petroleum  Dressing Changed  Dressing Change Frequency Daily  State of Healing Non-healing  Site / Wound Assessment Friable;Painful;Brown;Black;Bleeding  % Wound base Red or Granulating 0%  % Wound base Yellow/Fibrinous Exudate 100%  Peri-wound Assessment Excoriated  Wound Length (cm) 6 cm  Wound Width (cm) 5.5 cm  Wound Depth (cm) 1 cm  Wound Surface Area (cm^2) 33 cm^2  Wound Volume (cm^3) 33 cm^3  Undermining (cm) 1-1.5  Margins Unattached edges (unapproximated)  Drainage Amount Moderate  Drainage Description Odor  Treatment Debridement (Selective);Hydrotherapy (Pulse lavage) ([place])  Hydrotherapy  Pulsed Lavage with Suction (psi) -4 psi  Pulsed Lavage with Suction - Normal Saline Used 1000 mL  Pulsed Lavage Tip Tip with splash shield  Pulsed lavage therapy - wound location sacral  Selective Debridement  Selective Debridement - Location sacral  Selective Debridement - Tools Used Forceps;Scissors  Selective Debridement - Tissue Removed eschar,  slough  Wound Therapy - Assess/Plan/Recommendations  Wound Therapy - Clinical Statement Maudie Flakes, [facility], in to offer recommendations for hydrotherapy, dressings, which PT appreciates greatly. Added xeroform to black eschar, Dakins and ABD until 1/8, then start santyl MWF. The patient endorses pain when performing hydrotherpay. No family present. patient will benefit from SNF to have frequent dressings cahnges and pressure relief mattress. patient is not a candidate to be sitting in any chair at this time due to severity of the sacral wound. Hydrotherapy will not be neccessary  at DC per collaboration with Ms. Lissa Morales.  Wound Therapy - Functional Problem List total care/bedbound  Factors Delaying/Impairing Wound Healing Altered sensation;Multiple medical problems  Hydrotherapy Plan Debridement;Dressing change;Patient/family education;Pulsatile lavage with suction  Wound Therapy - Frequency 6X / week  Wound Therapy - Current Recommendations Case manager/social work  Wound Therapy - Follow Up Recommendations dressing changes by RN;Other (comment)  Wound Therapy Goals - Improve the function of patient's integumentary system by progressing the wound(s) through the phases of wound healing by:  Decrease Necrotic Tissue to 50  Decrease Necrotic Tissue - Progress Progressing toward goal  Increase Granulation Tissue to 50  Increase Granulation Tissue - Progress Progressing toward goal  Patient/Family will be able to  reposition  Patient/Family Instruction Goal - Progress Progressing toward goal  Goals/treatment plan/discharge plan were made with and agreed upon by patient/family No, Patient unable to participate in goals/treatment/discharge plan and family unavailable  Time For Goal Achievement 2 weeks  Wound Therapy - Potential for Goals [place] Pager [phone] Office [phone] Deb<20, 2 dres, kit,

## 2021-05-01 DIAGNOSIS — R319 Hematuria, unspecified: Secondary | ICD-10-CM | POA: Diagnosis not present

## 2021-05-01 DIAGNOSIS — T83511A Infection and inflammatory reaction due to indwelling urethral catheter, initial encounter: Secondary | ICD-10-CM | POA: Diagnosis not present

## 2021-05-01 DIAGNOSIS — L03317 Cellulitis of buttock: Secondary | ICD-10-CM | POA: Diagnosis not present

## 2021-05-01 DIAGNOSIS — I5020 Unspecified systolic (congestive) heart failure: Secondary | ICD-10-CM | POA: Diagnosis not present

## 2021-05-01 MED ORDER — CEFDINIR 300 MG PO CAPS
300.0000 mg | ORAL_CAPSULE | Freq: Two times a day (BID) | ORAL | Status: AC
  Administered 2021-05-01 – 2021-05-02 (×3): 300 mg via ORAL
  Filled 2021-05-01 (×3): qty 1

## 2021-05-01 MED ORDER — POTASSIUM CHLORIDE CRYS ER 20 MEQ PO TBCR
40.0000 meq | EXTENDED_RELEASE_TABLET | Freq: Once | ORAL | Status: AC
  Administered 2021-05-01: 40 meq via ORAL
  Filled 2021-05-01: qty 2

## 2021-05-01 NOTE — Progress Notes (Signed)
CSW made multiple calls today to all phone numbers/contact people in the chart. CSW was only able to leave a VM for patient's son-in-law, [name].

## 2021-05-01 NOTE — Evaluation (Addendum)
Physical Therapy Evaluation Patient Details Name: Chris Payne MRN: [id number] DOB: 07-Jan-1951 Today's Date: 05/01/2021  History of Present Illness  Pt is a 71 y.o. male who presented to [facility] hospital 04/27/21 for hematuria from home. Patient recently hospitalized from 03/25/2021 to 03/28/2021 with acute urinary retention secondary to BPH with AKI.  He was discharged home with indwelling Foley catheter. Pt has prior hospitalization 11/11-11/17 for COVID-19 PNA and was discharged home but return on 11/21 with need for SNF placement. Pt discharged to [facility] on 11/22. PMH of COPD, HFrEFm CAD s/p CABG, Ruptured AAA s/p repair, CVA, HTN, Prediabetes.   Clinical Impression  Chris Payne is 71 y.o. male admitted with above HPI and diagnosis. Patient is currently limited by functional impairments below (see PT problem list). Patient admitted from home with family, uncertain how much assist he is being provided at home for mobility. Patient has had multiple hospitalization in last year, in Nov 2021 pt wa mobilizing household distance with min guard and RW. One year later in Nov 2022 patient unable to ambulate and required Mod-Max +2 assist for bed mobility and transfers and has required Max/Total Assist for bed mobility since last admission 1 month ago. Pt is limited by Lt knee pain and contracture and OOB to recliner is contraindicated due to sacral pressure injury. At this time feel pt is most appropriate for long term care or to return home with 24/7 Assist from family/home aids. Recommend Palliative consult and chaplain services for emotional support due to functional decliner. PT will continue to see pt on hydrotherapy caseload and address bed mobility for pressure relief as part of wound care goals. Signing off on mobility at this time. See below for any follow-up Physical Therapy or equipment needs. PT is signing off. Thank you for this referral.        Recommendations for follow up therapy are one  component of a multi-disciplinary discharge planning process, led by the attending physician.  Recommendations may be updated based on patient status, additional functional criteria and insurance authorization.  Follow Up Recommendations Long-term institutional care without follow-up therapy vs. Return Home with 24/7 Assist from family    Assistance Recommended at Discharge Frequent or constant Supervision/Assistance  Patient can return home with the following  Assistance with feeding;Assistance with cooking/housework;Two people to help with bathing/dressing/bathroom;Two people to help with walking and/or transfers;Direct supervision/assist for medications management;Assist for transportation;Help with stairs or ramp for entrance    Equipment Recommendations Hospital bed (new pressure relief mattress)  Recommendations for Other Services   Palliative Consult; Chaplain services   Functional Status Assessment Patient has not had a recent decline in their functional status     Precautions / Restrictions Precautions Precautions: Fall Precaution Comments: immediate dizziness with sitting (no tolerance) Restrictions Weight Bearing Restrictions: No Other Position/Activity Restrictions: no OOB in chair activity due to sacral wound.      Mobility  Bed Mobility Overal bed mobility: Needs Assistance Bed Mobility: Supine to Sit;Sit to Supine Rolling: Mod assist;Max assist Sidelying to sit: Max assist;HOB elevated;+2 for safety/equipment   Sit to supine: Max assist;+2 for safety/equipment        Transfers                   General transfer comment: deferred, pt c/o dizziness sitting EOB    Ambulation/Gait                  Stairs  Wheelchair Mobility    Modified Rankin (Stroke Patients Only)       Balance Overall balance assessment: Needs assistance Sitting-balance support: Feet unsupported;Bilateral upper extremity supported Sitting  balance-Leahy Scale: Poor                                       Pertinent Vitals/Pain Pain Assessment: Faces Faces Pain Scale: Hurts little more Pain Location: sacrum Pain Descriptors / Indicators: Grimacing Pain Intervention(s): Limited activity within patient's tolerance;Premedicated before session;Monitored during session    Home Living Family/patient expects to be discharged to:: Unsure Living Arrangements: Spouse/significant other;Other relatives Available Help at Discharge: Family;Available 24 hours/day Type of Home: House Home Access: Ramped entrance       Home Layout: Two level;Able to live on main level with bedroom/bathroom Home Equipment: Rolling Walker (2 wheels);Wheelchair - manual;Cane - single point;Hospital bed Additional Comments: some information recieved from chart review, pt unreliabel historian    Prior Function Prior Level of Function : Needs assist       Physical Assist : ADLs (physical);Mobility (physical)     Mobility Comments: Total Assist ADLs Comments: max-total assist at bed level     Hand Dominance   Dominant Hand: Right    Extremity/Trunk Assessment   Upper Extremity Assessment Upper Extremity Assessment: RUE deficits/detail;LUE deficits/detail RUE Deficits / Details: WFL ROM, grossly 3+/5 strength throughout LUE Deficits / Details: WFL ROM, grossly 3+/5 strength throughout    Lower Extremity Assessment Lower Extremity Assessment: Defer to PT evaluation RLE Deficits / Details: Knee ROM limited lacking ext and flex (0-90 but painful), 2/5 for knee ext/flex, and hip abduction/ adduction 2/5, and 2/5 ankle dorsi/plantar flexion. LLE Deficits / Details: Knee ROM limited lacking ext and flex (10-25), 1/5 for knee ext, and hip abduction, 2/5 for knee flexion and 2-/5 for hip adduction, and 2/5 ankle dorsi/plantar flexion.    Cervical / Trunk Assessment Cervical / Trunk Assessment: Kyphotic  Communication   Communication:  No difficulties  Cognition Arousal/Alertness: Awake/alert Behavior During Therapy: WFL for tasks assessed/performed Overall Cognitive Status: No family/caregiver present to determine baseline cognitive functioning                                 General Comments: Patient exhibits grossly functional cognition - he is able to answer questions appropriately though he is not oriented to year or date - he did say the correct month. Knows who the President is. Is able to follow commands. His memory of recent medical events is impaired. When asked when he had last been at rehab he stated "years ago."        General Comments      Exercises     Assessment/Plan    PT Assessment All further PT needs can be met in the next venue of care  PT Problem List Decreased strength;Decreased range of motion;Decreased balance;Decreased activity tolerance;Decreased mobility;Decreased cognition;Decreased knowledge of use of DME;Decreased safety awareness;Decreased knowledge of precautions;Pain       PT Treatment Interventions Patient/family education;Therapeutic exercise;Functional mobility training;DME instruction;Therapeutic activities;Balance training    PT Goals (Current goals can be found in the Care Plan section)  Acute Rehab PT Goals Patient Stated Goal: none stated PT Goal Formulation: With patient Time For Goal Achievement: 05/15/21 Potential to Achieve Goals: Fair    Frequency Other (Comment) (sign off)  Co-evaluation               AM-PAC PT "6 Clicks" Mobility  Outcome Measure Help needed turning from your back to your side while in a flat bed without using bedrails?: Total Help needed moving from lying on your back to sitting on the side of a flat bed without using bedrails?: Total Help needed moving to and from a bed to a chair (including a wheelchair)?: Total Help needed standing up from a chair using your arms (e.g., wheelchair or bedside chair)?: Total Help  needed to walk in hospital room?: Total Help needed climbing 3-5 steps with a railing? : Total 6 Click Score: 6    End of Session   Activity Tolerance: Treatment limited secondary to medical complications (Comment) (dizziness (no BP taken)) Patient left: in bed;with call bell/phone within reach;with bed alarm set Nurse Communication: Mobility status PT Visit Diagnosis: Other abnormalities of gait and mobility (R26.89);Muscle weakness (generalized) (M62.81);History of falling (Z91.81)    Time: 9437-0052 PT Time Calculation (min) (ACUTE ONLY): 22 min   Charges:   PT Evaluation $PT Eval Moderate Complexity: 1 Mod          Verner Mould, DPT Acute Rehabilitation Services Office [phone] Pager [phone]   Jacques Navy 05/01/2021, 1:23 PM

## 2021-05-01 NOTE — Progress Notes (Signed)
Physical Therapy Wound Treatment Patient Details  Name: Chris Payne MRN: [id number] Date of Birth: 07-16-1950  Today's Date: 05/01/2021 Time: 8916-9450 Time Calculation (min): 35 min  Subjective  Subjective Assessment Subjective: "I wish it was all just over" Patient and Family Stated Goals: agreed with PLS Date of Onset:  (present) Prior Treatments: per pt, family changed dress.  Pain Score:    Wound Assessment  Pressure Injury 04/29/21 Sacrum Medial Unstageable - Full thickness tissue loss in which the base of the injury is covered by slough (yellow, tan, gray, green or brown) and/or eschar (tan, brown or black) in the wound bed. PT only, large painfu wound (Active)  Dressing Type Dakin's-soaked gauze;Impregnated gauze (bismuth);Gauze (Comment);Foam - Lift dressing to assess site every shift 05/01/21 1300  Dressing Changed 05/01/21 1300  Dressing Change Frequency Daily 05/01/21 1300  State of Healing Non-healing 05/01/21 1300  Site / Wound Assessment Brown;Yellow;Black;Painful 05/01/21 1300  % Wound base Red or Granulating 0% 05/01/21 1300  % Wound base Yellow/Fibrinous Exudate 100% 05/01/21 1300  Peri-wound Assessment Excoriated 05/01/21 1300  Wound Length (cm) 6 cm 04/30/21 1400  Wound Width (cm) 5.5 cm 04/30/21 1400  Wound Depth (cm) 1 cm 04/30/21 1400  Wound Surface Area (cm^2) 33 cm^2 04/30/21 1400  Wound Volume (cm^3) 33 cm^3 04/30/21 1400  Undermining (cm) 1-1.5 04/30/21 1400  Margins Unattached edges (unapproximated) 05/01/21 1300  Drainage Amount Moderate 05/01/21 1300  Drainage Description Serosanguineous;Odor 05/01/21 1300  Treatment Cleansed;Debridement (Selective);Hydrotherapy (Pulse lavage) 05/01/21 1300      Hydrotherapy Pulsed lavage therapy - wound location: sacral Pulsed Lavage with Suction (psi): 4 psi Pulsed Lavage with Suction - Normal Saline Used: 1000 mL Pulsed Lavage Tip: Tip with splash shield Selective Debridement Selective Debridement -  Location: sacral Selective Debridement - Tools Used: Forceps, Scissors Selective Debridement - Tissue Removed: eschar, slough    Wound Assessment and Plan  Wound Therapy - Assess/Plan/Recommendations Wound Therapy - Clinical Statement: Wound continues to be tender and pt reporting pain with pulsed lavage. No granulation tissue present and wound bed remains fibrous with yellow/brown adheared slough. Able to debride some loosened slough after pulsed lavage with tenderness at margins only. Continue PT 6x/week for Hydrotherapy to promote improved woundbed quality and reduce bioburden to control infection.  Continue with Dakins to wound bed, xeroform to black non-viable tissue, and foam dressing until 1/8, then start santyl MWF. Wound Therapy - Functional Problem List: total care/bedbound Factors Delaying/Impairing Wound Healing: Altered sensation, Multiple medical problems Hydrotherapy Plan: Debridement, Dressing change, Patient/family education, Pulsatile lavage with suction Wound Therapy - Frequency: 6X / week Wound Therapy - Current Recommendations: Case manager/social work Wound Therapy - Follow Up Recommendations: dressing changes by RN, Other (comment)  Wound Therapy Goals- Improve the function of patient's integumentary system by progressing the wound(s) through the phases of wound healing (inflammation - proliferation - remodeling) by: Wound Therapy Goals - Improve the function of patient's integumentary system by progressing the wound(s) through the phases of wound healing by: Decrease Necrotic Tissue to: 50 Decrease Necrotic Tissue - Progress: Progressing toward goal Increase Granulation Tissue to: 50 Increase Granulation Tissue - Progress: Progressing toward goal Patient/Family will be able to : reposition Patient/Family Instruction Goal - Progress: Progressing toward goal Goals/treatment plan/discharge plan were made with and agreed upon by patient/family: No, Patient unable to  participate in goals/treatment/discharge plan and family unavailable Time For Goal Achievement: 2 weeks Wound Therapy - Potential for Goals: Fair  Goals will be updated until maximal potential achieved  or discharge criteria met.  Discharge criteria: when goals achieved, discharge from hospital, MD decision/surgical intervention, no progress towards goals, refusal/missing three consecutive treatments without notification or medical reason.  GP     Charges PT Wound Care Charges $Wound Debridement up to 20 cm: < or equal to 20 cm $PT Hydrotherapy Dressing: 1 dressing $PT PLS Gun and Tip: 1 Supply $PT Hydrotherapy Visit: 1 Visit       Verner Mould, DPT Acute Rehabilitation Services Office [phone] Pager [phone]   Jacques Navy 05/01/2021, 2:02 PM

## 2021-05-01 NOTE — TOC Initial Note (Signed)
Transition of Care [facility]) - Initial/Assessment Note   Patient Details  Name: Chris Payne MRN: [id number] Date of Birth: 1950-10-02  Transition of Care [facility] CM/SW Contact:    Sherie Don, LCSW Phone Number: 05/01/2021, 3:49 PM  Clinical Narrative: CSW received return call from [employer]. Per Toula Moos, the plan is for the patient to return home with family. Although patient requires "a lot" of hands on care from family, the family is not interested in long-term placement at this time. Toula Moos provides assistance with ADLs and wound care. Toula Moos reported that patient has not been home for more than a few days at a time before needing to return to the hospital. TOC to follow.  Expected Discharge Plan: Home/Self Care Barriers to Discharge: Continued Medical Work up  Patient Goals and CMS Choice Patient states their goals for this hospitalization and ongoing recovery are:: Go home  Expected Discharge Plan and Services Expected Discharge Plan: Home/Self Care In-house Referral: Clinical Social Work Living arrangements for the past 2 months: Single Family Home  Prior Living Arrangements/Services Living arrangements for the past 2 months: Single Family Home Lives with:: Relatives Patient language and need for interpreter reviewed:: Yes Do you feel safe going back to the place where you live?: Yes      Need for Family Participation in Patient Care: Yes (Comment) Care giver support system in place?: Yes (comment) Criminal Activity/Legal Involvement Pertinent to Current Situation/Hospitalization: No - Comment as needed  Activities of Daily Living Home Assistive Devices/Equipment: Other (Comment) (unable to complete at this time) ADL Screening (condition at time of admission) Patient's cognitive ability adequate to safely complete daily activities?: No Is the patient deaf or have difficulty hearing?: No Does the patient have difficulty seeing, even when wearing glasses/contacts?: No Does  the patient have difficulty concentrating, remembering, or making decisions?: Yes Patient able to express need for assistance with ADLs?: Yes Does the patient have difficulty dressing or bathing?: Yes Independently performs ADLs?: No Communication: Independent Dressing (OT): Needs assistance Is this a change from baseline?: Pre-admission baseline Grooming: Needs assistance Is this a change from baseline?: Pre-admission baseline Feeding: Needs assistance Is this a change from baseline?: Pre-admission baseline Bathing: Needs assistance Is this a change from baseline?: Pre-admission baseline Toileting: Needs assistance Is this a change from baseline?: Pre-admission baseline In/Out Bed: Needs assistance Is this a change from baseline?: Pre-admission baseline Walks in Home: Needs assistance Is this a change from baseline?: Pre-admission baseline Does the patient have difficulty walking or climbing stairs?: Yes Weakness of Legs: Both Weakness of Arms/Hands: Both  Permission Sought/Granted Permission sought to share information with : Family Supports Permission granted to share information with : Yes, Verbal Permission Granted  Emotional Assessment Appearance:: Appears stated age Attitude/Demeanor/Rapport: Engaged Affect (typically observed): Accepting Orientation: : Oriented to Self, Oriented to Place, Oriented to  Time, Oriented to Situation Alcohol / Substance Use: Not Applicable Psych Involvement: No (comment)  Admission diagnosis:  UTI (urinary tract infection) [N39.0] Urinary tract infection associated with indwelling urethral catheter, initial encounter ([place]) [B15.176H, N39.0] Hematuria, unspecified type [R31.9] Patient Active Problem List   Diagnosis Date Noted   Sepsis ([place]) 04/28/2021   Cellulitis 04/28/2021   History of COVID-19 04/28/2021   History of AAA (abdominal aortic aneurysm) repair 04/28/2021   Hematuria    Polyneuropathy due to type 2 diabetes mellitus ([place])  04/27/2021   Hyperglycemia due to type 2 diabetes mellitus ([place]) 04/27/2021   History of tobacco use 04/27/2021   Constipation 04/27/2021  UTI (urinary tract infection) 04/27/2021   Pressure injury of heel, unstageable ([place]) 03/27/2021   Urinary retention 03/26/2021   Cellulitis of left lower extremity    Swelling of left foot    Effusion of left knee    Acute hypoxemic respiratory failure due to COVID-19 [facility]) 03/09/2021   AKI (acute kidney injury) ([place]) 03/09/2021   Shortness of breath 03/08/2021   HFrEF (heart failure with reduced ejection fraction) ([place]) 03/08/2021   Unstageable pressure ulcer of sacral region ([place]) 03/08/2020   [place]ida rash of groin 10/19/2018   Acute on chronic systolic heart failure ([place]) 10/17/2018   Acute on chronic systolic (congestive) heart failure ([place]) 10/17/2018   COPD exacerbation ([place]) 05/21/2018   Chronic pain syndrome 07/19/2015   Degenerative disc disease, lumbar 07/19/2015   Weakness 04/17/2015   Generalized weakness 04/17/2015   Polycythemia 04/17/2015   BPH (benign prostatic hyperplasia) 04/17/2015   Seizure disorder ([place]) 10/12/2014   TIA (transient ischemic attack) 08/23/2013   Other and unspecified angina pectoris 07/04/2013   Nontoxic multinodular goiter 10/07/2012   Unstable angina (H[facility]) 06/19/2012   Carotid stenosis    PAD (peripheral artery disease) (H[facility])    Left inguinal hernia 05/30/2012   COPD (chronic obstructive pulmonary disease) ([place]) 08/24/2011   S/P AAA repair 08/24/2011   Lumbar disc disease    Hypertensive heart disease    Hyperlipidemia    CAD (coronary artery disease)    History of pontine CVA    GERD (gastroesophageal reflux disease)    PCP:  Benito Mccreedy, MD Pharmacy:   [facility] [place] - [address] [place] [address] Phone: [phone] Fax: [phone]  [facility] [address] [address] Phone: [phone] Fax: [phone]  Readmission Risk Interventions No flowsheet data found.

## 2021-05-01 NOTE — Progress Notes (Signed)
PROGRESS NOTE    Chris Payne  OYD:[id number] DOB: February 01, 1951 DOA: 04/27/2021 PCP: Benito Mccreedy, MD    Chief Complaint  Patient presents with   Hematuria    Brief Narrative:  Patient 71 year old gentleman history of coronary artery disease status post CABG, carotid stenosis, PAD, CVA, hypertension, COPD, chronic pain syndrome, recent BPH with urinary retention and indwelling Foley catheter presenting from home with concerns of hematuria. Patient noted on admission to be alert and oriented to self and place only unable to provide any meaningful history.  Patient noted to have recently been hospitalized 03/25/2021-03/28/2021 with acute urinary retention secondary to BPH with AKI discharged home with indwelling Foley catheter unclear as to whether he had follow-up with urology.  Patient not on daily anticoagulation.  Patient seen in the ED noted to have fever with temp up to 100.9, leukocytosis, urinalysis with gross hematuria on initial sample and UA positive for large leukocytes, nitrite negative, many bacteria, greater than 50 RBCs concerning for UTI.  Patient also noted to have a stage IV sacral decubitus with surrounding erythema concerning for cellulitis.  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  Patient placed empirically on IV Rocephin, IV vancomycin.  Wound care RN consulted.  General surgery consulted due to concerns for osteomyelitis.   Assessment & Plan:   Principal Problem:   UTI (urinary tract infection) Active Problems:   Unstageable pressure ulcer of sacral region ([place])   HFrEF (heart failure  with reduced ejection fraction) (H[facility])   Urinary retention   Sepsis ([place])   Cellulitis   History of COVID-19   History of AAA (abdominal aortic aneurysm) repair  #1 sepsis secondary to UTI with chronic indwelling Foley catheter, POA -Patient presented with criteria consistent with sepsis with fever, leukocytosis, positive urinalysis. -Foley catheter changed in the ED. -Initial Foley noted to have been placed during previous admission in early December following acute urinary retention due to BPH. -Urine cultures with multiple species. -Transition from IV Rocephin to Omnicef to complete a 5-day course of treatment. Follow.  2.  Hematuria secondary to UTI -CT abdomen and pelvis done with no other significant source for bleeding. -Foley catheter changed with improvement with hematuria. -Urine clear. -Hemoglobin stable at 10.8.   -Follow.   3.  Sacral cellulitis/stage IV unstageable sacral ulcer -CT abdomen and pelvis concerning for possible osteomyelitis. -Sed rate ordered this morning elevated at 105, CRP at 9.4. -Wound care consulted and hydrotherapy ordered as well as dressing changes. -Patient seen in consultation by general surgery who has assessed the wounds and feels no surgical debridement necessary at this time and agree with wound care recommendations of hydrotherapy and wet-to-dry dressing changes to promote gentle ongoing debridement.   -General surgery has signed off.  -Continue empiric IV vancomycin and IV Flagyl for anaerobic coverage.  4.  History of COVID-19 viral infection -Patient noted to have been positive for COVID-19 since 03/07/2021 and previously treated with steroids. -Currently asymptomatic. -Not hypoxic. -No need for airborne precautions or isolation at this time.  5.  Chronic diastolic CHF/ CAD s/p CABG -Euvolemic on examination. -Continue home regimen Lopressor, statin.   -Outpatient follow-up stable.  6.  Hypokalemia -Repleted.  Repeat labs in the  a.m.  7.AAA/status post aortofemoral bypass grafting -Noted on CT abdomen and pelvis unchanged. -Outpatient follow-up with vascular surgery.  8.  Stage IV sacral ulcer/unstageable ulcer, heel ulcers POA -Continue current dressing changes and hydrotherapy per wound care. Pressure Injury 03/27/21 Sacrum Unstageable - Full thickness tissue loss in which the base of the injury is covered by slough (yellow, tan, gray, green or brown) and/or eschar (tan, brown or black) in the wound bed. necrotic tissue and slough (Active)  03/27/21 1345  Location: Sacrum  Location Orientation:   Staging: Unstageable - Full thickness tissue loss in which the base of the injury is covered by slough (yellow, tan, gray, green or brown) and/or eschar (tan, brown or black) in the wound bed.  Wound Description (Comments): necrotic tissue and slough  Present on Admission: Yes     Pressure Injury 04/28/21 Foot Left;Posterior Stage 3 -  Full thickness tissue loss. Subcutaneous fat may be visible but bone, tendon or muscle are NOT exposed. prevalon boots and foam heel protectors intact (Active)  04/28/21 2000  Location: Foot (lateral heel)  Location Orientation: Left;Posterior  Staging: Stage 3 -  Full thickness tissue loss. Subcutaneous fat may be visible but bone, tendon or muscle are NOT exposed.  Wound Description (Comments): prevalon boots and foam heel protectors intact  Present on Admission: Yes     Pressure Injury 04/28/21 Heel Left Deep Tissue Pressure Injury - Purple or maroon localized area of discolored intact skin or blood-filled blister due to damage of underlying soft tissue from pressure and/or shear. blood filled blister left heel (Active)  04/28/21 2000  Location: Heel  Location Orientation: Left  Staging: Deep Tissue Pressure Injury - Purple or maroon localized area of discolored intact skin or blood-filled blister due to damage of underlying soft tissue from pressure and/or shear.  Wound Description  (Comments): blood filled blister left heel  Present on Admission: Yes     Pressure Injury 04/29/21 Sacrum Medial Unstageable - Full thickness tissue loss in which the base of the injury is covered by slough (yellow, tan, gray, green or brown) and/or eschar (tan, brown or black) in the wound bed. PT only, large painfu wound (Active)  04/29/21 1130  Location: Sacrum  Location Orientation: Medial  Staging: Unstageable - Full thickness tissue loss in which the base of the injury is covered by slough (yellow, tan, gray, green or brown) and/or eschar (tan, brown or black) in the wound bed.  Wound Description (Comments): PT only, large painfu wound  Present on Admission: Yes         DVT prophylaxis: SCDs Code Status: Full Family Communication: Updated patient.  No family at bedside Disposition:   Status is: Inpatient  The patient will require care spanning > 2 midnights and should be moved to inpatient because: Severity of illness      Consultants:  General surgery: Dr. Zenia Resides 04/29/2021 Wound care RN  Procedures: CT abdomen and pelvis 04/27/2021 Chest x-ray 04/27/2021  Antimicrobials:  IV Rocephin 04/27/2021>>>>> IV vancomycin 04/27/2021>>>>> IV Flagyl 04/29/2021>>>>   Subjective: Laying in bed.  Alert and oriented to self place and time.  No chest pain.  No shortness of breath.  No abdominal pain.  Feeling better overall.   Objective: Vitals:   04/30/21 2044 04/30/21 2101 05/01/21 0615 05/01/21 0648  BP:  127/65  124/74  Pulse:  70  68  Resp:  17  17  Temp:  98.6 F (37 C)  98.2  F (36.8 C)  TempSrc:  Oral  Oral  SpO2: 96% 96% 93% 97%  Weight:      Height:        Intake/Output Summary (Last 24 hours) at 05/01/2021 1054 Last data filed at 05/01/2021 0600 Gross per 24 hour  Intake 950.07 ml  Output 1300 ml  Net -349.93 ml    Filed Weights   04/27/21 2230 04/28/21 2000  Weight: 72 kg 104.8 kg    Examination:  General exam: NAD Respiratory system: CTA B anterior lung  fields.  No wheezes, no crackles, no rhonchi.  Normal respiratory effort.  Speaking in full sentences.   Cardiovascular system: RRR no murmurs rubs or gallops.  No JVD.  No lower extremity edema.  Gastrointestinal system: Abdomen is soft, nontender, nondistended, positive bowel sounds.  No rebound.  No guarding.  Genitourinary: Indwelling Foley catheter with clear urine. Central nervous system: Alert and oriented. No focal neurological deficits. Extremities: Symmetric 5 x 5 power. Skin: Large deep sacral wound stage IV with bone exposed with surrounding erythema. Psychiatry: Judgement and insight appear normal. Mood & affect appropriate.     Data Reviewed: I have personally reviewed following labs and imaging studies  CBC: Recent Labs  Lab 04/27/21 1834 04/28/21 0817 04/29/21 0314 04/30/21 0833  WBC 14.0* 12.5* 11.6* 7.6  NEUTROABS 11.0*  --  7.7 4.9  HGB 11.9* 10.5* 9.7* 10.8*  HCT 37.8* 33.2* 31.7* 34.3*  MCV 106.8* 105.7* 109.7* 105.9*  PLT 283 248 243 261     Basic Metabolic Panel: Recent Labs  Lab 04/27/21 1834 04/28/21 0817 04/29/21 0314 04/30/21 0833  NA 134* 132* 138 136  K 3.9 3.2* 4.4 3.5  CL 99 99 103 109  CO2 30 27 25 23   GLUCOSE 147* 122* 112* 89  BUN 28* 21 19 20   CREATININE 0.58* 0.48* 0.61 0.51*  CALCIUM 8.4* 8.2* 8.1* 7.9*  MG  --  1.8 1.9 2.0  PHOS  --   --  2.6 2.5     GFR: Estimated Creatinine Clearance: 97.8 mL/min (A) (by C-G formula based on SCr of 0.51 mg/dL (L)).  Liver Function Tests: Recent Labs  Lab 04/27/21 1834 04/29/21 0314 04/30/21 0833  AST 29  --   --   ALT 27  --   --   ALKPHOS 108  --   --   BILITOT 0.6  --   --   PROT 6.8  --   --   ALBUMIN 2.2* 1.7* 1.7*     CBG: No results for input(s): GLUCAP in the last 168 hours.   Recent Results (from the past 240 hour(s))  Resp Panel by RT-PCR (Flu A&B, Covid) Nasopharyngeal Swab     Status: Abnormal   Collection Time: 04/27/21  6:34 PM   Specimen: Nasopharyngeal  Swab; Nasopharyngeal(NP) swabs in vial transport medium  Result Value Ref Range Status   SARS Coronavirus 2 by RT PCR POSITIVE (A) NEGATIVE Final    Comment: (NOTE) SARS-CoV-2 target nucleic acids are DETECTED.  The SARS-CoV-2 RNA is generally detectable in upper respiratory specimens during the acute phase of infection. Positive results are indicative of the presence of the identified virus, but do not rule out bacterial infection or co-infection with other pathogens not detected by the test. Clinical correlation with patient history and other diagnostic information is necessary to determine patient infection status. The expected result is Negative.  Fact Sheet for Patients: EntrepreneurPulse.com.au  Fact Sheet for Healthcare Providers: IncredibleEmployment.be  This test is  not yet approved or cleared by the [place] and  has been authorized for detection and/or diagnosis of SARS-CoV-2 by FDA under an Emergency Use Authorization (EUA).  This EUA will remain in effect (meaning this test can be used) for the duration of  the COVID-19 declaration under Section 564(b)(1) of the A ct, 21 U.S.C. section 360bbb-3(b)(1), unless the authorization is terminated or revoked sooner.     Influenza A by PCR NEGATIVE NEGATIVE Final   Influenza B by PCR NEGATIVE NEGATIVE Final    Comment: (NOTE) The Xpert Xpress SARS-CoV-2/FLU/RSV plus assay is intended as an aid in the diagnosis of influenza from Nasopharyngeal swab specimens and should not be used as a sole basis for treatment. Nasal washings and aspirates are unacceptable for Xpert Xpress SARS-CoV-2/FLU/RSV testing.  Fact Sheet for Patients: EntrepreneurPulse.com.au  Fact Sheet for Healthcare Providers: IncredibleEmployment.be  This test is not yet approved or cleared by the [place] FDA and has been authorized for detection and/or diagnosis of  SARS-CoV-2 by FDA under an Emergency Use Authorization (EUA). This EUA will remain in effect (meaning this test can be used) for the duration of the COVID-19 declaration under Section 564(b)(1) of the Act, 21 U.S.C. section 360bbb-3(b)(1), unless the authorization is terminated or revoked.  Performed at [facility], [place] [address], [place], [place] [address]   Urine Culture     Status: Abnormal   Collection Time: 04/27/21  9:44 PM   Specimen: Urine, Catheterized  Result Value Ref Range Status   Specimen Description   Final    URINE, CATHETERIZED Performed at [facility] [address] [place], [place] [address]    Special Requests   Final    NONE Performed at [facility], [place] [address], [place], [place] [address]    Culture MULTIPLE SPECIES PRESENT, SUGGEST RECOLLECTION (A)  Final   Report Status 04/29/2021 FINAL  Final          Radiology Studies: No results found.      Scheduled Meds:  (feeding supplement) PROSource Plus  30 mL Oral BID   Chlorhexidine Gluconate Cloth  6 each Topical Daily   [START ON 05/04/2021] collagenase   Topical Daily   feeding supplement  237 mL Oral BID BM   gabapentin  300 mg Oral BID   guaiFENesin  1,200 mg Oral BID   metoprolol tartrate  25 mg Oral BID   mometasone-formoterol  2 puff Inhalation BID   pantoprazole  40 mg Oral Daily   rosuvastatin  40 mg Oral Daily   simethicone  160 mg Oral QID   sodium hypochlorite   Irrigation Daily   tamsulosin  0.4 mg Oral Daily   Continuous Infusions:  cefTRIAXone (ROCEPHIN)  IV 2 g (04/30/21 2147)   metronidazole 500 mg (05/01/21 0830)   vancomycin 1,750 mg (04/30/21 2252)     LOS: 3 days    Time spent: 35 minutes    Irine Seal, MD [facility]   To contact the attending provider between 7A-7P or the covering provider during after hours 7P-7A, please log into the web site [email or web address] and access using  universal [place] password for that web site. If you do not have the password, please call the hospital operator.  05/01/2021, 10:54 AM

## 2021-05-01 NOTE — Evaluation (Signed)
Occupational Therapy Evaluation Patient Details Name: Chris Payne MRN: [id number] DOB: [birth year] Today's Date: 05/01/2021   History of Present Illness Pt is a 71 y.o. male who presented to [facility] hospital 04/27/21 for hematuria from home. Patient recently hospitalized from 03/25/2021 to 03/28/2021 with acute urinary retention secondary to BPH with AKI.  He was discharged home with indwelling Foley catheter. Pt has prior hospitalization 11/11-11/17 for COVID-19 PNA and was discharged home but return on 11/21 with need for SNF placement. Pt discharged to [facility] on 11/22. PMH of COPD, HFrEFm CAD s/p CABG, Ruptured AAA s/p repair, CVA, HTN, Prediabetes.   Clinical Impression   Mr. Oley Lahaie is a 71 year old man who presents with above medical history. He has had multiple hospitalizations in the last two months and was discharged to short term rehab on last hospitalization. Today patient presents with mild confusion - he is grossly oriented to month (states 22 instead of 23 for the year) and the President. He reports has help at home and a hospital bed. His ability to detail his recent medical history is poor however. On evaluation he demonstrates generalized weakness, poor activity tolerance, impaired balance and has pain. He was unable to tolerate sitting at edge of bed due to dizziness. Patient has a sacral wound and his lower extremities are lacking range of motion. It appears he hasn't been out of bed in a while and that he has been near total care at home. Due to patient's sacral - prolonged sitting and or placement in chair is contraindicated. Once returned to supine patient reports improved dizziness but then voiced despondent thoughts - questioning the quality of his life wishing "to be done." Patient's family not present nor answered a call to ask questions in regards to assistance at home - though prior notes suggests there are multiple members in the home to provide patient with 24/7  assistance.   Patient appears to be near his home baseline and exhibits poor rehab potential. Therapist recommends palliative services and 24/7 assistance at discharge - whether at a facility or back at home. May benefit from a home health home assessment for education of family members on how to position patient in bed to reduce further skin breakdown and on how to maintain patient's current upper body abilities.      Recommendations for follow up therapy are one component of a multi-disciplinary discharge planning process, led by the attending physician.  Recommendations may be updated based on patient status, additional functional criteria and insurance authorization.   Follow Up Recommendations  Long-term institutional care without follow-up therapy (vs 24/7 assistance at home.)    Assistance Recommended at Discharge    Patient can return home with the following A lot of help with bathing/dressing/bathroom;Two people to help with walking and/or transfers;Direct supervision/assist for medications management;Assistance with cooking/housework;Direct supervision/assist for financial management    Functional Status Assessment  Patient has not had a recent decline in their functional status  Equipment Recommendations   (patient reports he needs a new mattress for hospital bed. Says it sags in the middle.)    Recommendations for Other Services       Precautions / Restrictions Precautions Precautions: Fall Precaution Comments: immediate dizziness with sitting (no tolerance) Restrictions Weight Bearing Restrictions: No Other Position/Activity Restrictions: no OOB in chair activity due to sacral wound.      Mobility Bed Mobility Overal bed mobility: Needs Assistance Bed Mobility: Supine to Sit;Sit to Supine;Rolling Rolling: Mod assist;Max assist Sidelying  to sit: Max assist;HOB elevated;+2 for safety/equipment Supine to sit: Max assist;HOB elevated Sit to supine: Max assist;+2 for  safety/equipment        Transfers                   General transfer comment: deferred, pt c/o dizziness sitting EOB      Balance Overall balance assessment: Needs assistance Sitting-balance support: Feet unsupported;Bilateral upper extremity supported Sitting balance-Leahy Scale: Poor                                     ADL either performed or assessed with clinical judgement   ADL Overall ADL's : Needs assistance/impaired Eating/Feeding: Set up;Bed level   Grooming: Set up;Bed level Grooming Details (indicate cue type and reason): able to wash face Upper Body Bathing: Moderate assistance;Bed level   Lower Body Bathing: Total assistance;Bed level   Upper Body Dressing : Bed level;Moderate assistance   Lower Body Dressing: Total assistance;Bed level     Toilet Transfer Details (indicate cue type and reason): unable Toileting- Clothing Manipulation and Hygiene: Total assistance;Bed level Toileting - Clothing Manipulation Details (indicate cue type and reason): incontinent of BM and foley catheter     Functional mobility during ADLs: Moderate assistance;+2 for physical assistance;+2 for safety/equipment       Vision   Vision Assessment?: No apparent visual deficits     Perception     Praxis      Pertinent Vitals/Pain Pain Assessment: Faces Faces Pain Scale: Hurts little more Pain Location: sacrum Pain Descriptors / Indicators: Grimacing Pain Intervention(s): Limited activity within patient's tolerance;Premedicated before session;Monitored during session     Hand Dominance Right   Extremity/Trunk Assessment Upper Extremity Assessment Upper Extremity Assessment: RUE deficits/detail;LUE deficits/detail RUE Deficits / Details: WFL ROM, grossly 3+/5 strength throughout LUE Deficits / Details: WFL ROM, grossly 3+/5 strength throughout   Lower Extremity Assessment Lower Extremity Assessment: Defer to PT evaluation RLE Deficits /  Details: Knee ROM limited lacking ext and flex (0-90 but painful), 2/5 for knee ext/flex, and hip abduction/ adduction 2/5, and 2/5 ankle dorsi/plantar flexion. LLE Deficits / Details: Knee ROM limited lacking ext and flex (10-25), 1/5 for knee ext, and hip abduction, 2/5 for knee flexion and 2-/5 for hip adduction, and 2/5 ankle dorsi/plantar flexion.   Cervical / Trunk Assessment Cervical / Trunk Assessment: Kyphotic   Communication Communication Communication: No difficulties   Cognition Arousal/Alertness: Awake/alert Behavior During Therapy: WFL for tasks assessed/performed Overall Cognitive Status: No family/caregiver present to determine baseline cognitive functioning                                 General Comments: Patient exhibits grossly functional cognition - he is able to answer questions appropriately though he is not oriented to year or date - he did say the correct month. Knows who the President is. Is able to follow commands. His memory of recent medical events is impaired. When asked when he had last been at rehab he stated "years ago."     General Comments       Exercises     Shoulder Instructions      Home Living Family/patient expects to be discharged to:: Unsure Living Arrangements: Spouse/significant other;Other relatives Available Help at Discharge: Family;Available 24 hours/day Type of Home: House Home Access: Ramped entrance  Home Layout: Two level;Able to live on main level with bedroom/bathroom     Bathroom Shower/Tub: Sponge bathes at baseline;Tub/shower unit   Bathroom Toilet: Standard Bathroom Accessibility: Yes   Home Equipment: Conservation officer, nature (2 wheels);Wheelchair - manual;Cane - single point;Hospital bed   Additional Comments: some information recieved from chart review, pt unreliabel historian      Prior Functioning/Environment Prior Level of Function : Needs assist       Physical Assist : ADLs (physical);Mobility  (physical)     Mobility Comments: Total Assist ADLs Comments: max-total assist at bed level        OT Problem List: Decreased strength;Decreased activity tolerance;Impaired balance (sitting and/or standing);Decreased cognition;Pain;Decreased safety awareness      OT Treatment/Interventions:      OT Goals(Current goals can be found in the care plan section) Acute Rehab OT Goals OT Goal Formulation: All assessment and education complete, DC therapy  OT Frequency:      Co-evaluation              AM-PAC OT "6 Clicks" Daily Activity     Outcome Measure Help from another person eating meals?: A Little Help from another person taking care of personal grooming?: A Little Help from another person toileting, which includes using toliet, bedpan, or urinal?: Total Help from another person bathing (including washing, rinsing, drying)?: A Lot Help from another person to put on and taking off regular upper body clothing?: A Lot Help from another person to put on and taking off regular lower body clothing?: Total 6 Click Score: 12   End of Session    Activity Tolerance: Patient limited by fatigue;Other (comment) (dizziness) Patient left:    OT Visit Diagnosis: Muscle weakness (generalized) (M62.81)                Time: 3154-0086 OT Time Calculation (min): 21 min Charges:  OT General Charges $OT Visit: 1 Visit OT Evaluation $OT Eval Low Complexity: 1 Low  Jashun Puertas, OTR/L [place]  Office [phone] Pager: [place] 05/01/2021, 1:53 PM

## 2021-05-02 DIAGNOSIS — R319 Hematuria, unspecified: Secondary | ICD-10-CM | POA: Diagnosis not present

## 2021-05-02 DIAGNOSIS — I5020 Unspecified systolic (congestive) heart failure: Secondary | ICD-10-CM | POA: Diagnosis not present

## 2021-05-02 DIAGNOSIS — T83511A Infection and inflammatory reaction due to indwelling urethral catheter, initial encounter: Secondary | ICD-10-CM | POA: Diagnosis not present

## 2021-05-02 DIAGNOSIS — L03317 Cellulitis of buttock: Secondary | ICD-10-CM | POA: Diagnosis not present

## 2021-05-02 LAB — BASIC METABOLIC PANEL
Anion gap: 6 (ref 5–15)
BUN: 15 mg/dL (ref 8–23)
CO2: 23 mmol/L (ref 22–32)
Calcium: 8.1 mg/dL — ABNORMAL LOW (ref 8.9–10.3)
Chloride: 111 mmol/L (ref 98–111)
Creatinine, Ser: 0.6 mg/dL — ABNORMAL LOW (ref 0.61–1.24)
GFR, Estimated: >60 mL/min (ref >60)
Glucose, Bld: 99 mg/dL (ref 70–99)
Potassium: 3.8 mmol/L (ref 3.5–5.1)
Sodium: 140 mmol/L (ref 135–145)

## 2021-05-02 LAB — CBC
HCT: 33.4 % — ABNORMAL LOW (ref 39.0–52.0)
Hemoglobin: 10.3 g/dL — ABNORMAL LOW (ref 13.0–17.0)
MCH: 32.9 pg (ref 26.0–34.0)
MCHC: 30.8 g/dL (ref 30.0–36.0)
MCV: 106.7 fL — ABNORMAL HIGH (ref 80.0–100.0)
Platelets: 289 10*3/uL (ref 150–400)
RBC: 3.13 MIL/uL — ABNORMAL LOW (ref 4.22–5.81)
RDW: 15.2 % (ref 11.5–15.5)
WBC: 7.5 10*3/uL (ref 4.0–10.5)
nRBC: 0 % (ref 0.0–0.2)

## 2021-05-02 LAB — MAGNESIUM: Magnesium: 1.9 mg/dL (ref 1.7–2.4)

## 2021-05-02 MED ORDER — QUETIAPINE FUMARATE 25 MG PO TABS
25.0000 mg | ORAL_TABLET | Freq: Every day | ORAL | Status: DC
  Administered 2021-05-02 – 2021-05-03 (×2): 25 mg via ORAL
  Filled 2021-05-02 (×2): qty 1

## 2021-05-02 MED ORDER — AMOXICILLIN-POT CLAVULANATE 875-125 MG PO TABS
1.0000 | ORAL_TABLET | Freq: Two times a day (BID) | ORAL | Status: DC
  Administered 2021-05-03 (×2): 1 via ORAL
  Filled 2021-05-02 (×2): qty 1

## 2021-05-02 MED ORDER — ALUM & MAG HYDROXIDE-SIMETH 200-200-20 MG/5ML PO SUSP
30.0000 mL | Freq: Once | ORAL | Status: AC
  Administered 2021-05-02: 30 mL via ORAL
  Filled 2021-05-02: qty 30

## 2021-05-02 MED ORDER — METRONIDAZOLE 500 MG PO TABS
500.0000 mg | ORAL_TABLET | Freq: Two times a day (BID) | ORAL | Status: AC
  Administered 2021-05-02 (×2): 500 mg via ORAL
  Filled 2021-05-02 (×2): qty 1

## 2021-05-02 MED ORDER — DOXYCYCLINE HYCLATE 100 MG PO TABS
100.0000 mg | ORAL_TABLET | Freq: Two times a day (BID) | ORAL | Status: DC
  Administered 2021-05-02 – 2021-05-03 (×4): 100 mg via ORAL
  Filled 2021-05-02 (×4): qty 1

## 2021-05-02 MED ORDER — LIDOCAINE VISCOUS HCL 2 % MT SOLN
15.0000 mL | Freq: Once | OROMUCOSAL | Status: AC
  Administered 2021-05-02: 15 mL via ORAL
  Filled 2021-05-02: qty 15

## 2021-05-02 MED ORDER — DOXYCYCLINE HYCLATE 100 MG PO TABS
100.0000 mg | ORAL_TABLET | Freq: Two times a day (BID) | ORAL | Status: DC
  Administered 2021-05-02: 100 mg via ORAL
  Filled 2021-05-02: qty 1

## 2021-05-02 NOTE — Plan of Care (Signed)
  Problem: Education: Goal: Knowledge of General Education information will improve Description Including pain rating scale, medication(s)/side effects and non-pharmacologic comfort measures Outcome: Progressing   

## 2021-05-02 NOTE — TOC Progression Note (Signed)
Transition of Care [facility]) - Progression Note   Patient Details  Name: Chris Payne MRN: [id number] Date of Birth: October 29, 1950  Transition of Care [facility]) CM/SW [place], LCSW Phone Number: 05/02/2021, 3:47 PM  Clinical Narrative: [facility] consulted for [facility] and wound care center appointment; patient will also need a hoyer lift at home. CSW made DME referral to [place] with [employer]. DME request packet was also faxed to [facility] for expedited approval. CSW called wound care center and they will call the patient's son, Chris Payne, to set up the appointment. Patient referred to the following [facility] agencies:  [facility]: declined [facility]: declined [facility]: under review    TOC to follow.  Expected Discharge Plan: [place] Barriers to Discharge: Continued Medical Work up, No [facility] will accept this patient  Expected Discharge Plan and Services Expected Discharge Plan: [place] In-house Referral: Clinical Social Work Post Acute Care Choice: Durable Medical Equipment, Home Health Living arrangements for the past 2 months: Single Family Home        DME Arranged: Other see comment Harrel Lemon lift) DME Agency: [facility] Date DME Agency Contacted: 05/02/21 Representative spoke with at DME Agency: Andee Poles  Readmission Risk Interventions No flowsheet data found.

## 2021-05-02 NOTE — Consult Note (Addendum)
[place] Nurse wound follow up Visit with PT did not occur as planned:  Nursing staff performed wound care to sacrum and heels today, so PT deferred hydrotherapy treatment until tomorrow. In addition to sacrum, [place] team members to assess bilateral heels.  [place] nursing team will follow, seeing weekly and as needed while in house and will remain available to this patient, the nursing and medical teams.   Thanks, Maudie Flakes, MSN, RN, [place], Arther Abbott  Pager# [phone]

## 2021-05-02 NOTE — Progress Notes (Signed)
Patient confused, physically aggressive, and uncooperative this morning. Patient grabbed pulse ox and wrapped cord around his hand and would not turn loose and was kicking and punching towards staff when vitals were taken Unable to perform dressing change.

## 2021-05-02 NOTE — Care Management Important Message (Signed)
Important Message  Patient Details IM Letter placed in Patients room. Name: Chris Payne MRN: [id number] Date of Birth: 1950-08-24   Medicare Important Message Given:  Yes     Kerin Salen 05/02/2021, 11:00 AM

## 2021-05-02 NOTE — Progress Notes (Signed)
PROGRESS NOTE    Chris Payne  VZD:[id number] DOB: 06-Aug-1950 DOA: 04/27/2021 PCP: Benito Mccreedy, MD    Chief Complaint  Patient presents with   Hematuria    Brief Narrative:  Patient 71 year old gentleman history of coronary artery disease status post CABG, carotid stenosis, PAD, CVA, hypertension, COPD, chronic pain syndrome, recent BPH with urinary retention and indwelling Foley catheter presenting from home with concerns of hematuria. Patient noted on admission to be alert and oriented to self and place only unable to provide any meaningful history.  Patient noted to have recently been hospitalized 03/25/2021-03/28/2021 with acute urinary retention secondary to BPH with AKI discharged home with indwelling Foley catheter unclear as to whether he had follow-up with urology.  Patient not on daily anticoagulation.  Patient seen in the ED noted to have fever with temp up to 100.9, leukocytosis, urinalysis with gross hematuria on initial sample and UA positive for large leukocytes, nitrite negative, many bacteria, greater than 50 RBCs concerning for UTI.  Patient also noted to have a stage IV sacral decubitus with surrounding erythema concerning for cellulitis.  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  Patient placed empirically on IV Rocephin, IV vancomycin.  Wound care RN consulted.  General surgery consulted due to concerns for osteomyelitis.   Assessment & Plan:   Principal Problem:   UTI (urinary tract infection) Active Problems:   Unstageable pressure ulcer of sacral region ([place])   HFrEF (heart failure  with reduced ejection fraction) (HCC)   Urinary retention   Sepsis ([place])   Cellulitis   History of COVID-19   History of AAA (abdominal aortic aneurysm) repair  #1 sepsis secondary to UTI with chronic indwelling Foley catheter, POA -Patient presented with criteria consistent with sepsis with fever, leukocytosis, positive urinalysis. -Foley catheter changed in the ED. -Initial Foley noted to have been placed during previous admission in early December following acute urinary retention due to BPH. -Urine cultures with multiple species. -IV Rocephin and transition to [place].   -We will transition from [place] to Augmentin to complete 5-day course of antibiotic treatment.    2.  Hematuria secondary to UTI -CT abdomen and pelvis done with no other significant source for bleeding. -Foley catheter changed with improvement with hematuria. -Urine clear. -Hemoglobin stable at 10.3. -Follow.   3.  Sacral cellulitis/stage IV unstageable sacral ulcer -CT abdomen and pelvis concerning for possible osteomyelitis. -Sed rate ordered this morning elevated at 105, CRP at 9.4. -Wound care consulted and hydrotherapy ordered as well as dressing changes. -Patient seen in consultation by general surgery who has assessed the wounds and feels no surgical debridement necessary at this time and agree with wound care recommendations of hydrotherapy and wet-to-dry dressing changes to promote gentle ongoing debridement.   -General surgery has signed off.  -Transition from IV vancomycin and IV Flagyl to doxycycline and Augmentin to complete a 7 to 10-day course of antibiotic treatment.   -We will need outpatient follow-up with the wound care center.   -We will also need home health RN on discharge.  4.  History of COVID-19 viral infection -Patient noted to have been positive for COVID-19 since 03/07/2021 and previously  treated with steroids. -Currently asymptomatic. -Not hypoxic. -No need for airborne  precautions or isolation at this time.  5.  Chronic diastolic CHF/ CAD s/p CABG -Euvolemic on examination. -Continue statin, Lopressor.   -Outpatient follow-up.  6.  Hypokalemia -Repleted.  Repeat labs in the a.m.  7.AAA/status post aortofemoral bypass grafting -Noted on CT abdomen and pelvis unchanged. -Outpatient follow-up with vascular surgery.  8.  Stage IV sacral ulcer/unstageable ulcer, heel ulcers POA -Continue current dressing changes and hydrotherapy per wound care. Pressure Injury 03/27/21 Sacrum Unstageable - Full thickness tissue loss in which the base of the injury is covered by slough (yellow, tan, gray, green or brown) and/or eschar (tan, brown or black) in the wound bed. necrotic tissue and slough (Active)  03/27/21 1345  Location: Sacrum  Location Orientation:   Staging: Unstageable - Full thickness tissue loss in which the base of the injury is covered by slough (yellow, tan, gray, green or brown) and/or eschar (tan, brown or black) in the wound bed.  Wound Description (Comments): necrotic tissue and slough  Present on Admission: Yes     Pressure Injury 04/28/21 Foot Left;Posterior Stage 3 -  Full thickness tissue loss. Subcutaneous fat may be visible but bone, tendon or muscle are NOT exposed. prevalon boots and foam heel protectors intact (Active)  04/28/21 2000  Location: Foot (lateral heel)  Location Orientation: Left;Posterior  Staging: Stage 3 -  Full thickness tissue loss. Subcutaneous fat may be visible but bone, tendon or muscle are NOT exposed.  Wound Description (Comments): prevalon boots and foam heel protectors intact  Present on Admission: Yes     Pressure Injury 04/28/21 Heel Left Deep Tissue Pressure Injury - Purple or maroon localized area of discolored intact skin or blood-filled blister due to damage of underlying soft tissue from pressure and/or shear. blood filled blister left heel (Active)  04/28/21 2000  Location: Heel  Location Orientation:  Left  Staging: Deep Tissue Pressure Injury - Purple or maroon localized area of discolored intact skin or blood-filled blister due to damage of underlying soft tissue from pressure and/or shear.  Wound Description (Comments): blood filled blister left heel  Present on Admission: Yes     Pressure Injury 04/29/21 Sacrum Medial Unstageable - Full thickness tissue loss in which the base of the injury is covered by slough (yellow, tan, gray, green or brown) and/or eschar (tan, brown or black) in the wound bed. PT only, large painfu wound (Active)  04/29/21 1130  Location: Sacrum  Location Orientation: Medial  Staging: Unstageable - Full thickness tissue loss in which the base of the injury is covered by slough (yellow, tan, gray, green or brown) and/or eschar (tan, brown or black) in the wound bed.  Wound Description (Comments): PT only, large painfu wound  Present on Admission: Yes         DVT prophylaxis: SCDs Code Status: Full Family Communication: Updated patient.  No family at bedside Disposition:   Status is: Inpatient  The patient will require care spanning > 2 midnights and should be moved to inpatient because: Severity of illness      Consultants:  General surgery: Dr. Zenia Resides 04/29/2021 Wound care RN  Procedures: CT abdomen and pelvis 04/27/2021 Chest x-ray 04/27/2021  Antimicrobials:  IV Rocephin 04/27/2021>>>>> 05/01/2021 IV vancomycin 04/27/2021>>>>> 10/14/2021 IV Flagyl 04/29/2021>>>> 05/02/2021 Omnicef 05/01/2021>>>>> 05/02/2021 Augmentin 05/03/2021>>>>> Doxycycline 05/02/2021>>>>>   Subjective: Laying in bed eating breakfast.  Complain of heartburn.  No chest pain.  No shortness of breath.  No abdominal  pain.  Overall feeling better.    Objective: Vitals:   05/01/21 1723 05/01/21 2010 05/01/21 2029 05/02/21 0451  BP: 98/64  125/72 (!) 132/99  Pulse: 68  76 76  Resp: 20  17 17   Temp: 97.7 F (36.5 C)  98.3 F (36.8 C) 97.6 F (36.4 C)  TempSrc: Oral  Oral   SpO2: 100% 97%  99%   Weight:      Height:        Intake/Output Summary (Last 24 hours) at 05/02/2021 1031 Last data filed at 05/02/2021 0943 Gross per 24 hour  Intake --  Output 2375 ml  Net -2375 ml    Filed Weights   04/27/21 2230 04/28/21 2000  Weight: 72 kg 104.8 kg    Examination:  General exam: NAD. Respiratory system: Lungs clear to auscultation bilaterally.  No wheezes, no crackles, no rhonchi.  Normal respiratory effort.  Speaking in full sentences.  Cardiovascular system: Regular rate rhythm no murmurs rubs or gallops.  No JVD.  No lower extremity edema.  Gastrointestinal system: Abdomen is soft, nontender, nondistended, positive bowel sounds.  No rebound.  No guarding.  Genitourinary: Indwelling Foley catheter with clear urine. Central nervous system: Alert and oriented. No focal neurological deficits. Extremities: Symmetric 5 x 5 power. Skin: Large deep sacral wound stage IV with bone exposed with surrounding erythema. Psychiatry: Judgement and insight appear normal. Mood & affect appropriate.     Data Reviewed: I have personally reviewed following labs and imaging studies  CBC: Recent Labs  Lab 04/27/21 1834 04/28/21 0817 04/29/21 0314 04/30/21 0833 05/02/21 0330  WBC 14.0* 12.5* 11.6* 7.6 7.5  NEUTROABS 11.0*  --  7.7 4.9  --   HGB 11.9* 10.5* 9.7* 10.8* 10.3*  HCT 37.8* 33.2* 31.7* 34.3* 33.4*  MCV 106.8* 105.7* 109.7* 105.9* 106.7*  PLT 283 248 243 261 289     Basic Metabolic Panel: Recent Labs  Lab 04/27/21 1834 04/28/21 0817 04/29/21 0314 04/30/21 0833 05/02/21 0330  NA 134* 132* 138 136 140  K 3.9 3.2* 4.4 3.5 3.8  CL 99 99 103 109 111  CO2 30 27 25 23 23   GLUCOSE 147* 122* 112* 89 99  BUN 28* 21 19 20 15   CREATININE 0.58* 0.48* 0.61 0.51* 0.60*  CALCIUM 8.4* 8.2* 8.1* 7.9* 8.1*  MG  --  1.8 1.9 2.0 1.9  PHOS  --   --  2.6 2.5  --      GFR: Estimated Creatinine Clearance: 97.8 mL/min (A) (by C-G formula based on SCr of 0.6 mg/dL (L)).  Liver  Function Tests: Recent Labs  Lab 04/27/21 1834 04/29/21 0314 04/30/21 0833  AST 29  --   --   ALT 27  --   --   ALKPHOS 108  --   --   BILITOT 0.6  --   --   PROT 6.8  --   --   ALBUMIN 2.2* 1.7* 1.7*     CBG: No results for input(s): GLUCAP in the last 168 hours.   Recent Results (from the past 240 hour(s))  Resp Panel by RT-PCR (Flu A&B, Covid) Nasopharyngeal Swab     Status: Abnormal   Collection Time: 04/27/21  6:34 PM   Specimen: Nasopharyngeal Swab; Nasopharyngeal(NP) swabs in vial transport medium  Result Value Ref Range Status   SARS Coronavirus 2 by RT PCR POSITIVE (A) NEGATIVE Final    Comment: (NOTE) SARS-CoV-2 target nucleic acids are DETECTED.  The SARS-CoV-2 RNA is generally detectable in  upper respiratory specimens during the acute phase of infection. Positive results are indicative of the presence of the identified virus, but do not rule out bacterial infection or co-infection with other pathogens not detected by the test. Clinical correlation with patient history and other diagnostic information is necessary to determine patient infection status. The expected result is Negative.  Fact Sheet for Patients: EntrepreneurPulse.com.au  Fact Sheet for Healthcare Providers: IncredibleEmployment.be  This test is not yet approved or cleared by the [place] FDA and  has been authorized for detection and/or diagnosis of SARS-CoV-2 by FDA under an Emergency Use Authorization (EUA).  This EUA will remain in effect (meaning this test can be used) for the duration of  the COVID-19 declaration under Section 564(b)(1) of the A ct, 21 U.S.C. section 360bbb-3(b)(1), unless the authorization is terminated or revoked sooner.     Influenza A by PCR NEGATIVE NEGATIVE Final   Influenza B by PCR NEGATIVE NEGATIVE Final    Comment: (NOTE) The Xpert Xpress SARS-CoV-2/FLU/RSV plus assay is intended as an aid in the diagnosis of  influenza from Nasopharyngeal swab specimens and should not be used as a sole basis for treatment. Nasal washings and aspirates are unacceptable for Xpert Xpress SARS-CoV-2/FLU/RSV testing.  Fact Sheet for Patients: EntrepreneurPulse.com.au  Fact Sheet for Healthcare Providers: IncredibleEmployment.be  This test is not yet approved or cleared by the [place] FDA and has been authorized for detection and/or diagnosis of SARS-CoV-2 by FDA under an Emergency Use Authorization (EUA). This EUA will remain in effect (meaning this test can be used) for the duration of the COVID-19 declaration under Section 564(b)(1) of the Act, 21 U.S.C. section 360bbb-3(b)(1), unless the authorization is terminated or revoked.  Performed at [facility], [place] [address], [place], [place] [address]   Urine Culture     Status: Abnormal   Collection Time: 04/27/21  9:44 PM   Specimen: Urine, Catheterized  Result Value Ref Range Status   Specimen Description   Final    URINE, CATHETERIZED Performed at [facility] [address] [place], [place] [address]    Special Requests   Final    NONE Performed at [facility], [place] [address], [place], [place] [address]    Culture MULTIPLE SPECIES PRESENT, SUGGEST RECOLLECTION (A)  Final   Report Status 04/29/2021 FINAL  Final          Radiology Studies: No results found.      Scheduled Meds:  (feeding supplement) PROSource Plus  30 mL Oral BID   [START ON 05/03/2021] amoxicillin-clavulanate  1 tablet Oral Q12H   cefdinir  300 mg Oral Q12H   Chlorhexidine Gluconate Cloth  6 each Topical Daily   [START ON 05/04/2021] collagenase   Topical Daily   doxycycline  100 mg Oral Q12H   feeding supplement  237 mL Oral BID BM   gabapentin  300 mg Oral BID   guaiFENesin  1,200 mg Oral BID   metoprolol tartrate  25 mg Oral BID   metroNIDAZOLE  500 mg Oral Q12H    mometasone-formoterol  2 puff Inhalation BID   pantoprazole  40 mg Oral Daily   QUEtiapine  25 mg Oral QHS   rosuvastatin  40 mg Oral Daily   simethicone  160 mg Oral QID   sodium hypochlorite   Irrigation Daily   tamsulosin  0.4 mg Oral Daily   Continuous Infusions:     LOS: 4 days    Time spent:  35 minutes    Irine Seal, MD [facility]   To contact the attending provider between 7A-7P or the covering provider during after hours 7P-7A, please log into the web site [email or web address] and access using universal [place] password for that web site. If you do not have the password, please call the hospital operator.  05/02/2021, 10:31 AM

## 2021-05-02 NOTE — Progress Notes (Signed)
Chaplain met with patient at bedside this afternoon. Chaplain introduced spiritual care services.  Chris Payne was sitting up and appearing sleepy and confused. He repeated his question "What's going on?" And looking outside his room window.  After attempting to converse - he rested a bit and requested to watch TV.  Chaplain provided him opportunity to talk if he desired so.   Will remain available for additional consultation for spiritual services.   Provided ministry of presence for a brief time.   Ended visit with a departing blessing.      05/02/21 1600  Clinical Encounter Type  Visited With Patient  Visit Type Initial  Referral From Physician;Nurse  Consult/Referral To Chaplain  Spiritual Encounters  Spiritual Needs Emotional;Prayer  Stress Factors  Patient Stress Factors Health changes;Lack of knowledge;Loss;Loss of control;Major life changes  Family Stress Factors None identified

## 2021-05-03 DIAGNOSIS — T83511A Infection and inflammatory reaction due to indwelling urethral catheter, initial encounter: Secondary | ICD-10-CM | POA: Diagnosis not present

## 2021-05-03 DIAGNOSIS — I5020 Unspecified systolic (congestive) heart failure: Secondary | ICD-10-CM | POA: Diagnosis not present

## 2021-05-03 DIAGNOSIS — Z9889 Other specified postprocedural states: Secondary | ICD-10-CM | POA: Diagnosis not present

## 2021-05-03 DIAGNOSIS — R319 Hematuria, unspecified: Secondary | ICD-10-CM | POA: Diagnosis not present

## 2021-05-03 LAB — BASIC METABOLIC PANEL
Anion gap: 4 — ABNORMAL LOW (ref 5–15)
BUN: 13 mg/dL (ref 8–23)
CO2: 27 mmol/L (ref 22–32)
Calcium: 8.6 mg/dL — ABNORMAL LOW (ref 8.9–10.3)
Chloride: 109 mmol/L (ref 98–111)
Creatinine, Ser: 0.56 mg/dL — ABNORMAL LOW (ref 0.61–1.24)
GFR, Estimated: >60 mL/min (ref >60)
Glucose, Bld: 104 mg/dL — ABNORMAL HIGH (ref 70–99)
Potassium: 3.6 mmol/L (ref 3.5–5.1)
Sodium: 140 mmol/L (ref 135–145)

## 2021-05-03 MED ORDER — SIMETHICONE 80 MG PO CHEW: 160.0000 mg | CHEWABLE_TABLET | Freq: Four times a day (QID) | ORAL | 0 refills | Status: DC | PRN

## 2021-05-03 MED ORDER — POTASSIUM CHLORIDE CRYS ER 20 MEQ PO TBCR
40.0000 meq | EXTENDED_RELEASE_TABLET | Freq: Once | ORAL | Status: AC
  Administered 2021-05-03: 40 meq via ORAL
  Filled 2021-05-03: qty 2

## 2021-05-03 MED ORDER — DAKINS (1/4 STRENGTH) 0.125 % EX SOLN: Freq: Every day | CUTANEOUS | 0 refills | Status: AC

## 2021-05-03 MED ORDER — AMOXICILLIN-POT CLAVULANATE 875-125 MG PO TABS: 1.0000 | ORAL_TABLET | Freq: Two times a day (BID) | ORAL | 0 refills | Status: AC

## 2021-05-03 MED ORDER — DOXYCYCLINE HYCLATE 100 MG PO TABS: 100.0000 mg | ORAL_TABLET | Freq: Two times a day (BID) | ORAL | 0 refills | Status: AC

## 2021-05-03 MED ORDER — COLLAGENASE 250 UNIT/GM EX OINT: TOPICAL_OINTMENT | Freq: Every day | CUTANEOUS | 0 refills | Status: AC

## 2021-05-03 MED ORDER — ASPIRIN 81 MG PO TBEC: 81.0000 mg | DELAYED_RELEASE_TABLET | Freq: Every day | ORAL | 12 refills | Status: DC

## 2021-05-03 NOTE — Plan of Care (Signed)
  Problem: Pain Managment: Goal: General experience of comfort will improve Outcome: Progressing   Problem: Safety: Goal: Ability to remain free from injury will improve Outcome: Progressing   

## 2021-05-03 NOTE — TOC Transition Note (Addendum)
Transition of Care [facility]) - CM/SW Discharge Note   Patient Details  Name: Chris Payne MRN: [id number] Date of Birth: 09/10/1950  [facility] CM/SW Contact:  Ross Ludwig, LCSW Phone Number: 05/03/2021, 1:07 PM   Clinical Narrative:     Patient will be going home with home health [facility]  CSW signing off please reconsult with any other social work needs, home health agency has been notified of planned discharge.  2:30pm CSW attempted to contact patient's emergency contacts Toula Moos voice mail left, Chrissie Noa unable to leave a voice mail, and Karilyn Cota unable to leave a voice mail.   4:55pm  CSW received phone call from patient's son Toula Moos.  He said his phones are still out at home, and he just received the message.  Per patient's son he will be here in 30-45 minutes to pick up patient.  CSW will put together an EMS packet in case patient ends up needing EMS transport.  CSW updated bedside nurse.  5:35pm  CSW received phone call from patient's son, he does need EMS transport to get patient home.  CSW updated bedside nurse, and arranged EMS transport.  Final next level of care: [place] Barriers to Discharge: Barriers Resolved   Patient Goals and CMS Choice Patient states their goals for this hospitalization and ongoing recovery are:: To return back home. CMS Medicare.gov Compare Post Acute Care list provided to:: Patient Represenative (must comment) Choice offered to / list presented to : Adult Children  Discharge Placement                       Discharge Plan and Services In-house Referral: Clinical Social Work   Post Acute Care Choice: Museum/gallery conservator, Home Health          DME Arranged: Other see comment Harrel Lemon lift) DME Agency: [facility] Date DME Agency Contacted: 05/02/21 Time DME Agency Contacted: [phone] Representative spoke with at DME Agency: Andee Poles HH Arranged: PT, OT, Nurse's Aide, RN, Social Work [place] Agency:  [facility] [place] Date [place]: 05/03/21 Time [place]: 1307 Representative spoke with at [place]: [place] ([place]) Interventions     Readmission Risk Interventions No flowsheet data found.

## 2021-05-03 NOTE — Plan of Care (Signed)
  Problem: Education: Goal: Knowledge of General Education information will improve Description Including pain rating scale, medication(s)/side effects and non-pharmacologic comfort measures Outcome: Progressing   

## 2021-05-03 NOTE — Progress Notes (Signed)
Physical Therapy Wound Treatment Patient Details  Name: Chris Payne MRN: [id number] Date of Birth: 06-22-1950   Today's Date: 05/03/2021 Time: 4174-0814 Time Calculation (min): 32   Subjective  Subjective Assessment Subjective: "whichever way you need me to Get it over with" Patient and Family Stated Goals: agreed with PLS Date of Onset:  (present)/admission Prior Treatments: per pt, family changed dress.  Pain Score:  premedicated, rest breaks given during pls lavage with incr pain. Tol well, pain not rated by pt, grimaces and indicates when break needed. Repost no pain after tx complete.   Wound Assessment     Pressure Injury 04/29/21 Sacrum Medial Unstageable - Full thickness tissue loss in which the base of the injury is covered by slough (yellow, tan, gray, green or brown) and/or eschar (tan, brown or black) in the wound bed. PT only, large painfu wound (Active)  Dressing Type NS gauze;Impregnated gauze (bismuth);Gauze (Comment);Foam - Lift dressing to assess site every shift  Dressing Changed  Dressing Change Frequency Daily  State of Healing Non-healing  Site / Wound Assessment Brown;Yellow;Black;Painful  % Wound base Red or Granulating 0%  % Wound base Yellow/Fibrinous Exudate 100%  Peri-wound Assessment Excoriated  Wound Length (cm) 6 cm  Wound Width (cm) 5.5 cm  Wound Depth (cm) 1 cm  Wound Surface Area (cm^2) 33 cm^2  Wound Volume (cm^3) 33 cm^3  Undermining (cm) 1-1.5  Margins Unattached edges (unapproximated)  Drainage Amount Moderate  Drainage Description Serosanguineous;Odor  Treatment Cleansed;Debridement (Selective);Hydrotherapy (Pulse lavage)       Hydrotherapy Pulsed lavage therapy - wound location: sacral Pulsed Lavage with Suction (psi): 4 psi Pulsed Lavage with Suction - Normal Saline Used: 1000 mL Pulsed Lavage Tip: Tip with splash shield Selective Debridement Selective Debridement - Location: sacral Selective Debridement - Tools Used: Forceps,  Scissors Selective Debridement - Tissue Removed: yellow-white slough      Wound Assessment and Plan  Wound Therapy - Assess/Plan/Recommendations Wound Therapy - Clinical Statement: Wound continues to be tender and pt reporting pain with pulsed lavage. No granulation tissue present and wound bed remains fibrous with yellow/brown adheared slough. Able to debride some loosened slough after pulsed lavage with tenderness at margins only. Continue PT 6x/week for Hydrotherapy to promote improved woundbed quality and reduce bioburden to control infection.  Continue with Dakins to wound bed, xeroform to black non-viable tissue, and foam dressing until 1/8, then start santyl MWF. Wound Therapy - Functional Problem List: total care/bedbound Factors Delaying/Impairing Wound Healing: Altered sensation, Multiple medical problems Hydrotherapy Plan: Debridement, Dressing change, Patient/family education, Pulsatile lavage with suction Wound Therapy - Frequency: 6X / week Wound Therapy - Current Recommendations: Case manager/social work Wound Therapy - Follow Up Recommendations: dressing changes by RN, Other (comment)   Wound Therapy Goals- Improve the function of patient's integumentary system by progressing the wound(s) through the phases of wound healing (inflammation - proliferation - remodeling) by: Wound Therapy Goals - Improve the function of patient's integumentary system by progressing the wound(s) through the phases of wound healing by: Decrease Necrotic Tissue to: 50 Decrease Necrotic Tissue - Progress: Progressing toward goal Increase Granulation Tissue to: 50 Increase Granulation Tissue - Progress: Progressing toward goal Patient/Family will be able to : reposition Patient/Family Instruction Goal - Progress: Progressing toward goal Goals/treatment plan/discharge plan were made with and agreed upon by patient/family: No, Patient unable to participate in goals/treatment/discharge plan and family  unavailable Time For Goal Achievement: 2 weeks Wound Therapy - Potential for Goals: Fair   Goals will be  updated until maximal potential achieved or discharge criteria met.  Discharge criteria: when goals achieved, discharge from hospital, MD decision/surgical intervention, no progress towards goals, refusal/missing three consecutive treatments without notification or medical reason.         Charges PT Wound Care Charges $Wound Debridement up to 20 cm: < or equal to 20 cm $PT Hydrotherapy Dressing: 1 dressing $PT PLS Gun and Tip: 1 Supply $PT Hydrotherapy Visit: 1 Visit    Baxter Flattery, PT  Acute Rehab Dept ([place]) [phone] Pager [phone]  05/03/2021

## 2021-05-03 NOTE — Progress Notes (Signed)
Call for updated ETA from ems, there are still 3 people at [facility] to be picked up before him and possible others at [facility] attempted to call family to update on timeframe as it is now after 11pm, was unable to reach anyone at any of the numbers listed in [place] for Chris Payne, and [name], (full voicemail box, continuous ringing, etc.) patient is sleeping comfortably at this time, will continue to provide nursing care and await his transportation.

## 2021-05-03 NOTE — Progress Notes (Signed)
PT has seen this patient today. Discharge order was written for this patient. However, patient's emergency contacts [name], Dellia Nims, and Jairo Ben have been contacted numerous times (have attempted to contact these emergency contacts numerous times to see if they can come pick up the patient. However, there is no response, voicemail box that has not been set up, or voicemail box is full. Randall Hiss, the Education officer, museum is aware. Also made Dr. Irine Seal, MD aware.

## 2021-05-03 NOTE — Progress Notes (Signed)
Patient confused. Attempted to call the contact numbers listed in the patient's emergency contact numbers to review discharge paperwork. Phone calls went straight to voicemail or voicemail box had not been set up yet. Discharge instructions included with patient's AVS packet to go home with patient when patient is picked up by EMS.

## 2021-05-03 NOTE — Progress Notes (Signed)
Just received call back from [facility], let him know that it could still be quite a while before his father is picked up. Toula Moos stated that was fine, that they had figured it might take a while.

## 2021-05-03 NOTE — Progress Notes (Signed)
Awaiting EMS to transport patient home with son, all night meds given, pt swallowed with no issues, foley care done, pt denies pain at this time, oriented to self only, peripheral iv site removed from left forearm, pressure dressing applied, all belongings packed up in 2 belongings bags to be transported with patient along with D/C instructions, will continue to monitor until patient is picked up.

## 2021-05-03 NOTE — Discharge Summary (Signed)
Physician Discharge Summary  Chris Payne UTM:[id number] DOB: 1950/06/09 DOA: 04/27/2021  PCP: Benito Mccreedy, MD  Admit date: 04/27/2021 Discharge date: 05/03/2021  Time spent: 55 minutes  Recommendations for Outpatient Follow-up:  Follow-up at [facility] for further management of sacral wound. Follow-up with Benito Mccreedy, MD in 2 weeks.  On follow-up patient will need a basic metabolic profile to follow-up on electrolytes and renal function. Follow-up with [facility], for follow-up on chronic indwelling Foley catheter, urinary retention.   Discharge Diagnoses:  Principal Problem:   UTI (urinary tract infection) Active Problems:   Unstageable pressure ulcer of sacral region ([place])   HFrEF (heart failure with reduced ejection fraction) ([facility])   Urinary retention   Sepsis ([place])   Cellulitis   History of COVID-19   History of AAA (abdominal aortic aneurysm) repair   Discharge Condition: Stable and improved  Diet recommendation: Heart healthy  Filed Weights   04/27/21 2230 04/28/21 2000  Weight: 72 kg 104.8 kg    History of present illness:  HPI per Dr Clarita Crane is a 71 y.o. male with medical history significant for CAD s/p CABG, carotid stenosis, PAD, CVA, hypertension, HFrEF, COPD, chronic pain syndrome, recent BPH with urinary retention and indwelling catheter who presents from home with concerns of hematuria.   Patient alert and oriented to self and place only.  Unable to provide any meaningful history. No answer from phone number on record. Reportedly brought in from home for hematuria.  Patient recently hospitalized from 03/25/2021 to 03/28/2021 with acute urinary retention secondary to BPH with AKI.  He was discharged home with indwelling Foley catheter.  Unclear whether he had follow-up with urology.  Does not appear to be on daily anticoagulation other than aspirin.   ED Course: He was febrile up to 100.38F,  normotensive on room air. WBC of 14, hemoglobin of 11.9. Sodium of 134, K of 3.9, creatinine of 0.58, BG of 147. UA had to be repeated due to gross hematuria with initial sample.  UA positive for large leukocyte, negative nitrite, many bacteria, greater than 50 RBC.  Hospital Course:  #1 sepsis secondary to UTI with chronic indwelling Foley catheter, POA -Patient presented with criteria consistent with sepsis with fever, leukocytosis, positive urinalysis. -Foley catheter changed in the ED. -Initial Foley noted to have been placed during previous admission in early December following acute urinary retention due to BPH. -Urine cultures with multiple species. -Was on IV Rocephin and transition to [facility] and has completed 5-day course of antibiotic treatment.   -Patient transition to Augmentin on discharge for 4 more days due to concerns for sacral cellulitis as well.   -Will need outpatient follow-up with urology 1 week post discharge.  2.  Hematuria secondary to UTI -CT abdomen and pelvis done with no other significant source for bleeding. -Foley catheter changed with improvement with hematuria. -Urine clear. -Patient aspirin was held and will be resumed 3 days post discharge. -Hemoglobin remained stable at 10.3. -Patient will be discharged with Foley catheter will need outpatient follow-up with urology.   3.  Sacral cellulitis/stage IV unstageable sacral ulcer -CT abdomen and pelvis concerning for possible osteomyelitis. -Sed rate ordered this morning elevated at 105, CRP at 9.4. -Wound care consulted and hydrotherapy ordered as well as dressing changes. -Patient seen in consultation by general surgery who has assessed the wounds and feels no surgical debridement necessary at this time and agree with wound care recommendations of hydrotherapy and wet-to-dry  dressing changes to promote gentle ongoing debridement.   -General surgery has signed off.  -Patient initially was on IV  vancomycin, IV Flagyl, and subsequently transitioned from IV vancomycin and IV Flagyl to doxycycline and Omnicef and subsequently transition to Augmentin.  Patient be discharged home on 4 more days of Augmentin and doxycycline to complete a 10-day course of antibiotic treatment.   -Patient to be set up to follow-up with the wound care center.   -Patient will also be discharged home with home health RN.   4.  History of COVID-19 viral infection -Patient noted to have been positive for COVID-19 since 03/07/2021 and previously treated with steroids. -Currently asymptomatic. -Not hypoxic. -No need for airborne precautions or isolation at this time.  5.  Chronic diastolic CHF/ CAD s/p CABG -Euvolemic on examination. -Patient maintained on home regimen Lopressor, statin.  -Outpatient follow-up.  6.  Hypokalemia -Repleted. -Outpatient follow-up with PCP  7.AAA/status post aortofemoral bypass grafting -Noted on CT abdomen and pelvis unchanged. -Outpatient follow-up with vascular surgery as scheduled.   8.  Stage IV sacral ulcer/unstageable ulcer, heel ulcers POA -Continue current dressing changes and hydrotherapy per wound care. Pressure Injury 03/27/21 Sacrum Unstageable - Full thickness tissue loss in which the base of the injury is covered by slough (yellow, tan, gray, green or brown) and/or eschar (tan, brown or black) in the wound bed. necrotic tissue and slough (Active)  03/27/21 1345  Location: Sacrum  Location Orientation:   Staging: Unstageable - Full thickness tissue loss in which the base of the injury is covered by slough (yellow, tan, gray, green or brown) and/or eschar (tan, brown or black) in the wound bed.  Wound Description (Comments): necrotic tissue and slough  Present on Admission: Yes     Pressure Injury 04/28/21 Foot Left;Posterior Stage 3 -  Full thickness tissue loss. Subcutaneous fat may be visible but bone, tendon or muscle are NOT exposed. prevalon boots and foam  heel protectors intact (Active)  04/28/21 2000  Location: Foot (lateral heel)  Location Orientation: Left;Posterior  Staging: Stage 3 -  Full thickness tissue loss. Subcutaneous fat may be visible but bone, tendon or muscle are NOT exposed.  Wound Description (Comments): prevalon boots and foam heel protectors intact  Present on Admission: Yes     Pressure Injury 04/28/21 Heel Left Deep Tissue Pressure Injury - Purple or maroon localized area of discolored intact skin or blood-filled blister due to damage of underlying soft tissue from pressure and/or shear. blood filled blister left heel (Active)  04/28/21 2000  Location: Heel  Location Orientation: Left  Staging: Deep Tissue Pressure Injury - Purple or maroon localized area of discolored intact skin or blood-filled blister due to damage of underlying soft tissue from pressure and/or shear.  Wound Description (Comments): blood filled blister left heel  Present on Admission: Yes     Pressure Injury 04/29/21 Sacrum Medial Unstageable - Full thickness tissue loss in which the base of the injury is covered by slough (yellow, tan, gray, green or brown) and/or eschar (tan, brown or black) in the wound bed. PT only, large painfu wound (Active)  04/29/21 1130  Location: Sacrum  Location Orientation: Medial  Staging: Unstageable - Full thickness tissue loss in which the base of the injury is covered by slough (yellow, tan, gray, green or brown) and/or eschar (tan, brown or black) in the wound bed.  Wound Description (Comments): PT only, large painfu wound  Present on Admission: Yes       Procedures:  CT abdomen and pelvis 04/27/2021 Chest x-ray 04/27/2021  Consultations: General surgery: Dr. Zenia Resides 04/29/2021 Wound care RN  Discharge Exam: Vitals:   05/02/21 2135 05/03/21 0448  BP: 130/83 120/72  Pulse: 78 74  Resp: 18 17  Temp: 98.1 F (36.7 C) 98.5 F (36.9 C)  SpO2: 97% 96%    General: NAD Cardiovascular: Regular rate rhythm no  murmurs rubs or gallops.  No JVD.  No lower extremity edema. Respiratory: Clear to auscultation bilaterally anterior lung fields.  No wheezes, no crackles, no rhonchi.  Fair air movement.  Speaking in full sentences.  Discharge Instructions   Discharge Instructions     Diet - low sodium heart healthy   Complete by: As directed    Discharge wound care:   Complete by: As directed    As above.   Increase activity slowly   Complete by: As directed       Allergies as of 05/03/2021   No Known Allergies      Medication List     STOP taking these medications    ciprofloxacin 500 MG tablet Commonly known as: CIPRO       TAKE these medications    acetaminophen 500 MG tablet Commonly known as: TYLENOL Take 1,000 mg by mouth every 6 (six) hours as needed for mild pain.   albuterol 108 (90 Base) MCG/ACT inhaler Commonly known as: VENTOLIN HFA Inhale 2 puffs into the lungs every 6 (six) hours as needed for wheezing or shortness of breath.   amoxicillin-clavulanate 875-125 MG tablet Commonly known as: AUGMENTIN Take 1 tablet by mouth every 12 (twelve) hours for 4 days.   aspirin 81 MG EC tablet Take 1 tablet (81 mg total) by mouth daily. Start taking on: May 06, 2021 What changed: These instructions start on May 06, 2021. If you are unsure what to do until then, ask your doctor or other care provider.   budesonide-formoterol 160-4.5 MCG/ACT inhaler Commonly known as: Symbicort Inhale 2 puffs into the lungs 2 (two) times daily.   collagenase ointment Commonly known as: SANTYL Apply topically daily. Start taking on: May 04, 2021   doxycycline 100 MG tablet Commonly known as: VIBRA-TABS Take 1 tablet (100 mg total) by mouth every 12 (twelve) hours for 4 days.   Ensure Take 237 mLs by mouth 2 (two) times daily between meals.   feeding supplement (PRO-STAT SUGAR FREE 64) Liqd Take 30 mLs by mouth 2 (two) times daily. Wound health   furosemide 20 MG  tablet Commonly known as: LASIX Take 1 tablet (20 mg total) by mouth daily.   gabapentin 300 MG capsule Commonly known as: NEURONTIN Take 300 mg by mouth 2 (two) times daily.   metFORMIN 500 MG tablet Commonly known as: GLUCOPHAGE Take 500 mg by mouth daily with supper.   metoprolol tartrate 25 MG tablet Commonly known as: LOPRESSOR Take 1 tablet (25 mg total) by mouth 2 (two) times daily.   pantoprazole 40 MG tablet Commonly known as: PROTONIX Take 40 mg by mouth daily.   rosuvastatin 40 MG tablet Commonly known as: CRESTOR Take 1 tablet (40 mg total) by mouth daily.   simethicone 80 MG chewable tablet Commonly known as: MYLICON Chew 2 tablets (160 mg total) by mouth 4 (four) times daily as needed for flatulence.   sodium hypochlorite 0.125 % Soln Commonly known as: DAKIN'S 1/4 STRENGTH Irrigate with as directed daily for 2 days.   tamsulosin 0.4 MG Caps capsule Commonly known as: FLOMAX Take 1 capsule (  0.4 mg total) by mouth daily.               Durable Medical Equipment  (From admission, onward)           Start     Ordered   05/02/21 1216  For home use only DME Other see comment  Once       Comments: Harrel Lemon lift  Question:  Length of Need  Answer:  12 Months   05/02/21 1215   05/01/21 1024  For home use only DME Air overlay mattress  Once        05/01/21 1024              Discharge Care Instructions  (From admission, onward)           Start     Ordered   05/03/21 0000  Discharge wound care:       Comments: As above.   05/03/21 1130           No Known Allergies  Follow-up Information     [facility]              Follow up.   Why: The clinic will call to schedule a wound care appointment. Contact information: [address] [address] [phone]        Benito Mccreedy, MD. Schedule an appointment as soon as possible for a visit in 2 week(s).    Specialty: Internal Medicine Contact information: [address] [place] [address] [place]. Schedule an appointment as soon as possible for a visit in 1 week(s).   Contact information: [place] [phone]                 The results of significant diagnostics from this hospitalization (including imaging, microbiology, ancillary and laboratory) are listed below for reference.    Significant Diagnostic Studies: CT ABDOMEN PELVIS W CONTRAST  Result Date: 04/28/2021 CLINICAL DATA:  Gross hematuria.  Indwelling catheter. EXAM: CT ABDOMEN AND PELVIS WITH CONTRAST TECHNIQUE: Multidetector CT imaging of the abdomen and pelvis was performed using the standard protocol following bolus administration of intravenous contrast. CONTRAST:  15mL OMNIPAQUE IOHEXOL 350 MG/ML SOLN COMPARISON:  CT without contrast 03/26/2021, CT with contrast 09/15/2020 FINDINGS: Lower chest: There is scattered linear scarring or atelectasis. Right lower lobe posterior basal bronchiolar thickening and multifocal bronchiolar impaction are increased since the last CT, without visible bronchopneumonia. There is cardiomegaly with sternotomy sutures and native calcific CAD. Hepatobiliary: Stable hepatic cysts. 2.8 cm single stone in the mid gallbladder lumen with mild gallbladder dilatation without wall thickening or biliary dilatation. Pancreas: Moderate diffuse fatty atrophy. No mass enhancement or ductal dilatation. Spleen: Normal. Adrenals/Urinary Tract: Stable 1.5 cm right adrenal adenoma. Normal left adrenal. Few tiny subcentimeter too small to characterize hypodensities again noted right kidney with cortical scarring anterior left lower pole. No mass enhancement, renal calculus or hydronephrosis is seen. The bladder was previously markedly dilated now is catheterized contracted and not well seen. There are no  perivesical inflammatory changes but the bladder wall could be at least mildly thickened. There are few tiny stones in the right side of the lumen. Stomach/Bowel: No dilatation or wall thickening including the appendix. Moderate fecal stasis. Scattered uncomplicated colonic diverticula. Vascular/Lymphatic: Patent aortobifemoral bypass grafting but with severe stenosis at the left  femoral graft surgical anastomosis. This was seen previously. Native aortoiliac atherosclerosis is again noted with 5.4 cm thrombosed saccular infrarenal AAA , not significantly changed. Reproductive: Prominent prostate gland measuring 5.2 cm. Brachytherapy seeds and/or surgical clips in the prostate are again shown. Other: Small umbilical and inguinal fat hernias. No free air, hemorrhage or fluid. Musculoskeletal: Skin breakdown over the dorsal aspect of the sacrococcygeal junction occurred since the prior study. Underlying osteomyelitis is not excluded. Left hip chronic arthrodesis pinning is again shown, osteopenia and degenerative changes of the lumbar spine and severe L5-S1 degenerative foraminal stenosis. IMPRESSION: 1. The bladder, previously markedly distended has been catheterized since the last CT and is now decompressed and not well seen but could be thickened. There are few tiny stones in the right side of the lumen. 2. Interval skin breakdown over the dorsal sacrococcygeal junction. Underlying osteomyelitis not excluded but no frank destructive lesion is evident. 3. Distended gallbladder with single 2.8 cm stone but no wall thickening or biliary dilatation. 4. Aortobifemoral bypass grafting with severe stenosis at the left femoral graft tie-in. 5. 5.4 cm thrombosed saccular infrarenal AAA , not significantly changed. Vascular surgery follow-up suggested unless already scheduled. 6. Multifocal bronchiolar impaction in the right lower lobe. 7. Hepatic cysts and other chronic findings discussed above. Electronically Signed   By:  Telford Nab M.D.   On: 04/28/2021 00:19   DG Chest Port 1 View  Result Date: 04/27/2021 CLINICAL DATA:  Pain. EXAM: PORTABLE CHEST 1 VIEW COMPARISON:  Chest x-ray 03/26/2021. FINDINGS: The aorta is tortuous, unchanged. The heart is enlarged, unchanged. The lungs and costophrenic angles are clear. There is no pneumothorax. No acute fractures are seen. IMPRESSION: 1. No acute cardiopulmonary process. 2. Stable cardiomegaly. Electronically Signed   By: Ronney Asters M.D.   On: 04/27/2021 18:24    Microbiology: Recent Results (from the past 240 hour(s))  Resp Panel by RT-PCR (Flu A&B, Covid) Nasopharyngeal Swab     Status: Abnormal   Collection Time: 04/27/21  6:34 PM   Specimen: Nasopharyngeal Swab; Nasopharyngeal(NP) swabs in vial transport medium  Result Value Ref Range Status   SARS Coronavirus 2 by RT PCR POSITIVE (A) NEGATIVE Final    Comment: (NOTE) SARS-CoV-2 target nucleic acids are DETECTED.  The SARS-CoV-2 RNA is generally detectable in upper respiratory specimens during the acute phase of infection. Positive results are indicative of the presence of the identified virus, but do not rule out bacterial infection or co-infection with other pathogens not detected by the test. Clinical correlation with patient history and other diagnostic information is necessary to determine patient infection status. The expected result is Negative.  Fact Sheet for Patients: EntrepreneurPulse.com.au  Fact Sheet for Healthcare Providers: IncredibleEmployment.be  This test is not yet approved or cleared by the [place] FDA and  has been authorized for detection and/or diagnosis of SARS-CoV-2 by FDA under an Emergency Use Authorization (EUA).  This EUA will remain in effect (meaning this test can be used) for the duration of  the COVID-19 declaration under Section 564(b)(1) of the A ct, 21 U.S.C. section 360bbb-3(b)(1), unless the authorization  is terminated or revoked sooner.     Influenza A by PCR NEGATIVE NEGATIVE Final   Influenza B by PCR NEGATIVE NEGATIVE Final    Comment: (NOTE) The Xpert Xpress SARS-CoV-2/FLU/RSV plus assay is intended as an aid in the diagnosis of influenza from Nasopharyngeal swab specimens and should not be used as a sole basis for treatment. Nasal washings and aspirates are  unacceptable for Xpert Xpress SARS-CoV-2/FLU/RSV testing.  Fact Sheet for Patients: EntrepreneurPulse.com.au  Fact Sheet for Healthcare Providers: IncredibleEmployment.be  This test is not yet approved or cleared by the [place] FDA and has been authorized for detection and/or diagnosis of SARS-CoV-2 by FDA under an Emergency Use Authorization (EUA). This EUA will remain in effect (meaning this test can be used) for the duration of the COVID-19 declaration under Section 564(b)(1) of the Act, 21 U.S.C. section 360bbb-3(b)(1), unless the authorization is terminated or revoked.  Performed at [facility], [place] [address], [place], [place] [address]   Urine Culture     Status: Abnormal   Collection Time: 04/27/21  9:44 PM   Specimen: Urine, Catheterized  Result Value Ref Range Status   Specimen Description   Final    URINE, CATHETERIZED Performed at [place] [address] [place], [place] [address]    Special Requests   Final    NONE Performed at [facility], [place] [address], [place], [place] [address]    Culture MULTIPLE SPECIES PRESENT, SUGGEST RECOLLECTION (A)  Final   Report Status 04/29/2021 FINAL  Final     Labs: Basic Metabolic Panel: Recent Labs  Lab 04/28/21 0817 04/29/21 0314 04/30/21 0833 05/02/21 0330 05/03/21 0324  NA 132* 138 136 140 140  K 3.2* 4.4 3.5 3.8 3.6  CL 99 103 109 111 109  CO2 27 25 23 23 27   GLUCOSE 122* 112* 89 99 104*  BUN 21 19 20 15 13   CREATININE 0.48* 0.61 0.51* 0.60*  0.56*  CALCIUM 8.2* 8.1* 7.9* 8.1* 8.6*  MG 1.8 1.9 2.0 1.9  --   PHOS  --  2.6 2.5  --   --    Liver Function Tests: Recent Labs  Lab 04/27/21 1834 04/29/21 0314 04/30/21 0833  AST 29  --   --   ALT 27  --   --   ALKPHOS 108  --   --   BILITOT 0.6  --   --   PROT 6.8  --   --   ALBUMIN 2.2* 1.7* 1.7*   No results for input(s): LIPASE, AMYLASE in the last 168 hours. No results for input(s): AMMONIA in the last 168 hours. CBC: Recent Labs  Lab 04/27/21 1834 04/28/21 0817 04/29/21 0314 04/30/21 0833 05/02/21 0330  WBC 14.0* 12.5* 11.6* 7.6 7.5  NEUTROABS 11.0*  --  7.7 4.9  --   HGB 11.9* 10.5* 9.7* 10.8* 10.3*  HCT 37.8* 33.2* 31.7* 34.3* 33.4*  MCV 106.8* 105.7* 109.7* 105.9* 106.7*  PLT 283 248 243 261 289   Cardiac Enzymes: No results for input(s): CKTOTAL, CKMB, CKMBINDEX, TROPONINI in the last 168 hours. BNP: BNP (last 3 results) Recent Labs    09/15/20 0957 03/08/21 1251 03/12/21 0251  BNP 43.9 86.1 55.1    ProBNP (last 3 results) No results for input(s): PROBNP in the last 8760 hours.  CBG: No results for input(s): GLUCAP in the last 168 hours.     Signed:  Irine Seal MD.  [facility] 05/03/2021, 11:38 AM

## 2021-05-07 ENCOUNTER — Encounter (HOSPITAL_BASED_OUTPATIENT_CLINIC_OR_DEPARTMENT_OTHER): Payer: Medicare HMO | Admitting: Physician Assistant

## 2021-05-08 ENCOUNTER — Other Ambulatory Visit: Payer: Self-pay

## 2021-05-08 ENCOUNTER — Encounter (HOSPITAL_COMMUNITY): Payer: Self-pay

## 2021-05-08 ENCOUNTER — Emergency Department (HOSPITAL_COMMUNITY)
Admission: EM | Admit: 2021-05-08 | Discharge: 2021-05-08 | Disposition: A | Discharge status: Home / Self Care | Payer: Medicare HMO | Attending: Emergency Medicine | Admitting: Emergency Medicine

## 2021-05-08 DIAGNOSIS — L89159 Pressure ulcer of sacral region, unspecified stage: Secondary | ICD-10-CM | POA: Diagnosis not present

## 2021-05-08 DIAGNOSIS — R4182 Altered mental status, unspecified: Secondary | ICD-10-CM | POA: Diagnosis not present

## 2021-05-08 DIAGNOSIS — R319 Hematuria, unspecified: Secondary | ICD-10-CM | POA: Diagnosis not present

## 2021-05-08 DIAGNOSIS — R338 Other retention of urine: Secondary | ICD-10-CM

## 2021-05-08 DIAGNOSIS — X58XXXA Exposure to other specified factors, initial encounter: Secondary | ICD-10-CM | POA: Diagnosis not present

## 2021-05-08 DIAGNOSIS — R404 Transient alteration of awareness: Secondary | ICD-10-CM | POA: Diagnosis not present

## 2021-05-08 DIAGNOSIS — Z7982 Long term (current) use of aspirin: Secondary | ICD-10-CM | POA: Diagnosis not present

## 2021-05-08 DIAGNOSIS — R300 Dysuria: Secondary | ICD-10-CM | POA: Insufficient documentation

## 2021-05-08 DIAGNOSIS — R29898 Other symptoms and signs involving the musculoskeletal system: Secondary | ICD-10-CM | POA: Diagnosis not present

## 2021-05-08 DIAGNOSIS — R531 Weakness: Secondary | ICD-10-CM | POA: Diagnosis not present

## 2021-05-08 DIAGNOSIS — S41111A Laceration without foreign body of right upper arm, initial encounter: Secondary | ICD-10-CM | POA: Insufficient documentation

## 2021-05-08 DIAGNOSIS — R339 Retention of urine, unspecified: Secondary | ICD-10-CM | POA: Insufficient documentation

## 2021-05-08 DIAGNOSIS — Z7401 Bed confinement status: Secondary | ICD-10-CM | POA: Diagnosis not present

## 2021-05-08 DIAGNOSIS — S4991XA Unspecified injury of right shoulder and upper arm, initial encounter: Secondary | ICD-10-CM | POA: Diagnosis present

## 2021-05-08 DIAGNOSIS — L89150 Pressure ulcer of sacral region, unstageable: Secondary | ICD-10-CM

## 2021-05-08 LAB — CBC WITH DIFFERENTIAL/PLATELET
Abs Immature Granulocytes: 0.06 10*3/uL (ref 0.00–0.07)
Basophils Absolute: 0 10*3/uL (ref 0.0–0.1)
Basophils Relative: 0 %
Eosinophils Absolute: 0 10*3/uL (ref 0.0–0.5)
Eosinophils Relative: 0 %
HCT: 42.2 % (ref 39.0–52.0)
Hemoglobin: 12.9 g/dL — ABNORMAL LOW (ref 13.0–17.0)
Immature Granulocytes: 1 %
Lymphocytes Relative: 10 %
Lymphs Abs: 1.3 10*3/uL (ref 0.7–4.0)
MCH: 33.2 pg (ref 26.0–34.0)
MCHC: 30.6 g/dL (ref 30.0–36.0)
MCV: 108.5 fL — ABNORMAL HIGH (ref 80.0–100.0)
Monocytes Absolute: 1.1 10*3/uL — ABNORMAL HIGH (ref 0.1–1.0)
Monocytes Relative: 9 %
Neutro Abs: 10.1 10*3/uL — ABNORMAL HIGH (ref 1.7–7.7)
Neutrophils Relative %: 80 %
Platelets: 358 10*3/uL (ref 150–400)
RBC: 3.89 MIL/uL — ABNORMAL LOW (ref 4.22–5.81)
RDW: 15.8 % — ABNORMAL HIGH (ref 11.5–15.5)
WBC: 12.5 10*3/uL — ABNORMAL HIGH (ref 4.0–10.5)
nRBC: 0 % (ref 0.0–0.2)

## 2021-05-08 LAB — URINALYSIS, ROUTINE W REFLEX MICROSCOPIC
Bilirubin Urine: NEGATIVE
Glucose, UA: NEGATIVE mg/dL
Ketones, ur: NEGATIVE mg/dL
Nitrite: NEGATIVE
Protein, ur: NEGATIVE mg/dL
RBC / HPF: >50 RBC/hpf — ABNORMAL HIGH (ref 0–5)
Specific Gravity, Urine: 1.018 (ref 1.005–1.030)
pH: 6 (ref 5.0–8.0)

## 2021-05-08 LAB — BASIC METABOLIC PANEL
Anion gap: 7 (ref 5–15)
BUN: 24 mg/dL — ABNORMAL HIGH (ref 8–23)
CO2: 28 mmol/L (ref 22–32)
Calcium: 8.9 mg/dL (ref 8.9–10.3)
Chloride: 101 mmol/L (ref 98–111)
Creatinine, Ser: 0.8 mg/dL (ref 0.61–1.24)
GFR, Estimated: >60 mL/min (ref >60)
Glucose, Bld: 152 mg/dL — ABNORMAL HIGH (ref 70–99)
Potassium: 4.8 mmol/L (ref 3.5–5.1)
Sodium: 136 mmol/L (ref 135–145)

## 2021-05-08 NOTE — Discharge Instructions (Addendum)
You were seen in the emergency department for urinary symptoms in the setting of a Foley catheter.  Your Foley catheter was replaced with good improvement in your symptoms and good urine flow.  Please follow-up with urology and your primary care doctor.  Continue your regular medications.  Return to the emergency department if any worsening or concerning symptoms

## 2021-05-08 NOTE — ED Provider Notes (Signed)
[facility] DEPT Provider Note   CSN: [id number] Arrival date & time: 05/08/21  1114     History  Chief Complaint  Patient presents with   Groin Pain    Chris Payne is a 71 y.o. male.  Level 5 caveat secondary to poor historian.  He is here by ambulance from home for complaint of pain from Foley catheter.  He said it started this morning.  On review of prior medical records looks like he has had a catheter in for at least a month.  He was admitted last week for sepsis UTI and had a catheter exchanged.  He denies any fever or cough.  He has some low abdominal pain.  The history is provided by the patient and the EMS personnel.  Male GU Problem Presenting symptoms: dysuria   Context: spontaneously   Relieved by:  None tried Worsened by:  Nothing Ineffective treatments:  None tried Associated symptoms: abdominal pain and hematuria   Associated symptoms: no fever, no nausea and no vomiting   Risk factors: recent infection and urinary catheter       Home Medications Prior to Admission medications   Medication Sig Start Date End Date Taking? Authorizing Provider  acetaminophen (TYLENOL) 500 MG tablet Take 1,000 mg by mouth every 6 (six) hours as needed for mild pain.    [provider]  albuterol (VENTOLIN HFA) 108 (90 Base) MCG/ACT inhaler Inhale 2 puffs into the lungs every 6 (six) hours as needed for wheezing or shortness of breath. 03/08/20   [place] Donnamarie Poag, DO  Amino Acids-Protein Hydrolys (FEEDING SUPPLEMENT, PRO-STAT SUGAR FREE 64,) LIQD Take 30 mLs by mouth 2 (two) times daily. Wound health    [provider]  aspirin 81 MG EC tablet Take 1 tablet (81 mg total) by mouth daily. 05/06/21   Eugenie Filler, MD  budesonide-formoterol [facility]) 160-4.5 MCG/ACT inhaler Inhale 2 puffs into the lungs 2 (two) times daily. 03/08/20   [place] Donnamarie Poag, DO  collagenase (SANTYL) ointment Apply topically daily. 05/04/21   Eugenie Filler, MD  Ensure (ENSURE) Take 237 mLs by mouth 2 (two) times daily between meals.    [provider]  furosemide (LASIX) 20 MG tablet Take 1 tablet (20 mg total) by mouth daily. 02/10/20   Deno Etienne, DO  gabapentin (NEURONTIN) 300 MG capsule Take 300 mg by mouth 2 (two) times daily.    [provider]  metFORMIN (GLUCOPHAGE) 500 MG tablet Take 500 mg by mouth daily with supper.    [provider]  metoprolol tartrate (LOPRESSOR) 25 MG tablet Take 1 tablet (25 mg total) by mouth 2 (two) times daily. 03/08/20 04/28/21  [place] Donnamarie Poag, DO  pantoprazole (PROTONIX) 40 MG tablet Take 40 mg by mouth daily. 02/19/21   [provider]  rosuvastatin (CRESTOR) 40 MG tablet Take 1 tablet (40 mg total) by mouth daily. 03/08/20 04/28/21  [place] Donnamarie Poag, DO  simethicone (MYLICON) 80 MG chewable tablet Chew 2 tablets (160 mg total) by mouth 4 (four) times daily as needed for flatulence. 05/03/21   Eugenie Filler, MD  tamsulosin (FLOMAX) 0.4 MG CAPS capsule Take 1 capsule (0.4 mg total) by mouth daily. 03/29/21   Corky Sox, MD      Allergies    Patient has no known allergies.    Review of Systems   Review of Systems  Constitutional:  Negative for fever.  HENT:  Negative for sore throat.   Eyes:  Negative for visual disturbance.  Respiratory:  Negative for shortness of breath.   Cardiovascular:  Negative for chest pain.  Gastrointestinal:  Positive for abdominal pain. Negative for nausea and vomiting.  Genitourinary:  Positive for dysuria and hematuria.  Musculoskeletal:  Negative for neck pain.  Skin:  Positive for wound. Negative for rash.  Neurological:  Negative for headaches.   Physical Exam Updated Vital Signs BP (!) 170/100 (BP Location: Left Arm)    Pulse 96    Temp 98.3 F (36.8 C) (Oral)    Resp 18    Ht 5\' 7"  (1.702 m)    Wt 73 kg    SpO2 96%    BMI 25.22 kg/m  Physical Exam Vitals and nursing note reviewed.  Constitutional:      General: He  is not in acute distress.    Appearance: Normal appearance. He is well-developed.  HENT:     Head: Normocephalic and atraumatic.  Eyes:     Conjunctiva/sclera: Conjunctivae normal.  Cardiovascular:     Rate and Rhythm: Normal rate and regular rhythm.     Heart sounds: No murmur heard. Pulmonary:     Effort: Pulmonary effort is normal. No respiratory distress.     Breath sounds: Normal breath sounds.  Abdominal:     Palpations: Abdomen is soft.     Tenderness: There is no abdominal tenderness. There is no guarding or rebound.  Genitourinary:    Penis: Normal.      Comments: Circumcised male with Foley catheter in place.  No lesions identified. Musculoskeletal:        General: No swelling. Normal range of motion.     Cervical back: Neck supple.     Right lower leg: No edema.     Left lower leg: No edema.     Comments: Skin tear on right upper arm without signs of infection.  Large sacral decubitus  Skin:    General: Skin is warm and dry.     Capillary Refill: Capillary refill takes less than 2 seconds.  Neurological:     General: No focal deficit present.     Mental Status: He is alert.    ED Results / Procedures / Treatments   Labs (all labs ordered are listed, but only abnormal results are displayed) Labs Reviewed  URINALYSIS, ROUTINE W REFLEX MICROSCOPIC - Abnormal; Notable for the following components:      Result Value   APPearance HAZY (*)    Hgb urine dipstick MODERATE (*)    Leukocytes,Ua LARGE (*)    RBC / HPF >50 (*)    Bacteria, UA RARE (*)    All other components within normal limits  BASIC METABOLIC PANEL - Abnormal; Notable for the following components:   Glucose, Bld 152 (*)    BUN 24 (*)    All other components within normal limits  CBC WITH DIFFERENTIAL/PLATELET - Abnormal; Notable for the following components:   WBC 12.5 (*)    RBC 3.89 (*)    Hemoglobin 12.9 (*)    MCV 108.5 (*)    RDW 15.8 (*)    Neutro Abs 10.1 (*)    Monocytes Absolute 1.1  (*)    All other components within normal limits  URINE CULTURE    EKG None  Radiology No results found.  Procedures Procedures    Medications Ordered in ED Medications - No data to display  ED Course/ Medical Decision Making/ A&P Clinical Course as of 05/09/21 0949  Thu May 08, 2021  1203 Nurse replaced Foley and got out about 1100 cc of urine.  Sample sent for urinalysis.  Patient's abdominal symptoms are improved now.  We were able to roll the patient and he is got a large unstageable sacral decubitus.  No foul drainage. [MB]  6222 Patient states at baseline he cannot walk and is bedbound.  He cannot tell me why this is so. [MB]  1233 Attempted to reach family, mailbox full and unable to leave message. [MB]  68 Nurse was able to reach patient's son and he is on his way up to pick up patient. [MB]    Clinical Course User Index [MB] Hayden Rasmussen, MD                           Medical Decision Making This patient complains of Foley catheter pain abdominal pain; this involves an extensive number of treatment Options and is a complaint that carries with it a high risk of complications and Morbidity. The differential includes infection, retention, obstruction, constipation, sepsis, Sirs  I ordered, reviewed and interpreted labs, which included CBC with elevated white count, hemoglobin low better than priors, chemistries with elevated glucose normal renal function, urinalysis with hematuria possible infection although rare bacteria Additional history obtained from EMS Previous records obtained and reviewed in epic including recent discharge summary  After the interventions stated above, I reevaluated the patient and found patient to be symptomatically improved after replacement of his obstructed Foley.  He has no more abdominal or groin/GU pain.  This is likely the cause of his symptoms.  Currently no indications for admission.  As he just finished antibiotics do not feel  needs another course at present.  Recommended close follow-up with [facility] and his primary care doctor.  Family on the way up to pick him up.          Final Clinical Impression(s) / ED Diagnoses Final diagnoses:  Acute urinary retention  Pressure injury of sacral region, unstageable [facility])    Rx / DC Orders ED Discharge Orders     None         Hayden Rasmussen, MD 05/09/21 [phone]

## 2021-05-08 NOTE — ED Triage Notes (Signed)
Pt to er room number 2 via [facility], per [facility] pt is here for pain in his penis, pt has indwelling foley cath, states that pt was recently d/c from hospital.  Pt has urine in foley bag, pt has multiple skin tears on her arms and legs.

## 2021-05-08 NOTE — ED Notes (Signed)
Dressings placed on wounds on upper arms, telfa and kerlex per md instructions.

## 2021-05-08 NOTE — ED Notes (Signed)
Attempted to call family to update them, no answer

## 2021-05-08 NOTE — ED Notes (Signed)
Son called and he is unable to come get pt, states that he would like me to call [phone] to arrange transportation, called number, they were available in 4 hours and pt would not be able to lay flat.  Called [facility] for transportation home.

## 2021-05-08 NOTE — ED Notes (Signed)
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  Pt has numerous bruises and skin tears on his person.  Pt oriented to person and place, doesn't know day of the week, re oriented pt.  Repositioned pt off of pressure ulcer.

## 2021-05-08 NOTE — ED Notes (Signed)
Attempted to call step son to confirm that someone was home, no answer, pt states that his wife is home, report to [facility], 1200 ml drained from foley bag, pt verbalized understanding d/c instructions and follow up, advised to return for any concerns or worsening symptoms.  Pt from dpt via [facility].

## 2021-05-10 LAB — URINE CULTURE: Culture: >=100000 — AB

## 2021-05-11 ENCOUNTER — Telehealth (HOSPITAL_BASED_OUTPATIENT_CLINIC_OR_DEPARTMENT_OTHER): Payer: Self-pay | Admitting: *Deleted

## 2021-05-11 NOTE — Telephone Encounter (Signed)
Post ED Visit - Positive Culture Follow-up  Culture report reviewed by antimicrobial stewardship pharmacist: [place] Team []  Elenor Quinones, Pharm.D. []  Heide Guile, Pharm.D., BCPS AQ-ID []  Parks Neptune, Pharm.D., BCPS []  Alycia Rossetti, Pharm.D., BCPS []  [place], Pharm.D., BCPS, AAHIVP []  Legrand Como, Pharm.D., BCPS, AAHIVP []  Salome Arnt, PharmD, BCPS []  Johnnette Gourd, PharmD, BCPS []  Hughes Better, PharmD, BCPS []  Leeroy Cha, PharmD []  Laqueta Linden, PharmD, BCPS []  Albertina Parr, PharmD  [place] Team []  Leodis Sias, PharmD []  Lindell Spar, PharmD []  Royetta Asal, PharmD []  Graylin Shiver, Rph []  Rema Fendt) Glennon Mac, PharmD []  Arlyn Dunning, PharmD []  Netta Cedars, PharmD []  Dia Sitter, PharmD []  Leone Haven, PharmD [x]  Gretta Arab, PharmD []  Theodis Shove, PharmD []  Peggyann Juba, PharmD []  Reuel Boom, PharmD   Positive urine culture  no further patient follow-up is required at this time.  Rosie Fate 05/11/2021, 9:14 AM

## 2021-05-14 ENCOUNTER — Encounter (HOSPITAL_COMMUNITY): Payer: Self-pay | Admitting: Student

## 2021-05-14 ENCOUNTER — Inpatient Hospital Stay (HOSPITAL_COMMUNITY)
Admission: EM | Admit: 2021-05-14 | Discharge: 2021-05-19 | DRG: 871 | Disposition: A | Discharge status: Hospice | Payer: Medicare HMO | Attending: Internal Medicine | Admitting: Internal Medicine

## 2021-05-14 ENCOUNTER — Other Ambulatory Visit: Payer: Self-pay

## 2021-05-14 ENCOUNTER — Emergency Department (HOSPITAL_COMMUNITY): Payer: Medicare HMO

## 2021-05-14 DIAGNOSIS — Z66 Do not resuscitate: Secondary | ICD-10-CM | POA: Diagnosis present

## 2021-05-14 DIAGNOSIS — R4020 Unspecified coma: Secondary | ICD-10-CM | POA: Diagnosis not present

## 2021-05-14 DIAGNOSIS — Y738 Miscellaneous gastroenterology and urology devices associated with adverse incidents, not elsewhere classified: Secondary | ICD-10-CM | POA: Diagnosis present

## 2021-05-14 DIAGNOSIS — E1169 Type 2 diabetes mellitus with other specified complication: Secondary | ICD-10-CM | POA: Diagnosis not present

## 2021-05-14 DIAGNOSIS — Z79899 Other long term (current) drug therapy: Secondary | ICD-10-CM

## 2021-05-14 DIAGNOSIS — L89154 Pressure ulcer of sacral region, stage 4: Secondary | ICD-10-CM | POA: Diagnosis not present

## 2021-05-14 DIAGNOSIS — Z7951 Long term (current) use of inhaled steroids: Secondary | ICD-10-CM

## 2021-05-14 DIAGNOSIS — A419 Sepsis, unspecified organism: Secondary | ICD-10-CM | POA: Diagnosis not present

## 2021-05-14 DIAGNOSIS — L89159 Pressure ulcer of sacral region, unspecified stage: Secondary | ICD-10-CM

## 2021-05-14 DIAGNOSIS — Z951 Presence of aortocoronary bypass graft: Secondary | ICD-10-CM | POA: Diagnosis not present

## 2021-05-14 DIAGNOSIS — R339 Retention of urine, unspecified: Secondary | ICD-10-CM | POA: Diagnosis present

## 2021-05-14 DIAGNOSIS — L89150 Pressure ulcer of sacral region, unstageable: Secondary | ICD-10-CM | POA: Diagnosis not present

## 2021-05-14 DIAGNOSIS — Z811 Family history of alcohol abuse and dependence: Secondary | ICD-10-CM

## 2021-05-14 DIAGNOSIS — Z7401 Bed confinement status: Secondary | ICD-10-CM

## 2021-05-14 DIAGNOSIS — R0689 Other abnormalities of breathing: Secondary | ICD-10-CM | POA: Diagnosis not present

## 2021-05-14 DIAGNOSIS — R531 Weakness: Secondary | ICD-10-CM | POA: Diagnosis not present

## 2021-05-14 DIAGNOSIS — L0390 Cellulitis, unspecified: Secondary | ICD-10-CM | POA: Diagnosis present

## 2021-05-14 DIAGNOSIS — J9811 Atelectasis: Secondary | ICD-10-CM | POA: Diagnosis not present

## 2021-05-14 DIAGNOSIS — T83511A Infection and inflammatory reaction due to indwelling urethral catheter, initial encounter: Secondary | ICD-10-CM | POA: Diagnosis not present

## 2021-05-14 DIAGNOSIS — Z7984 Long term (current) use of oral hypoglycemic drugs: Secondary | ICD-10-CM | POA: Diagnosis not present

## 2021-05-14 DIAGNOSIS — N390 Urinary tract infection, site not specified: Secondary | ICD-10-CM

## 2021-05-14 DIAGNOSIS — Z515 Encounter for palliative care: Secondary | ICD-10-CM | POA: Diagnosis not present

## 2021-05-14 DIAGNOSIS — K5730 Diverticulosis of large intestine without perforation or abscess without bleeding: Secondary | ICD-10-CM | POA: Diagnosis not present

## 2021-05-14 DIAGNOSIS — Z978 Presence of other specified devices: Secondary | ICD-10-CM | POA: Diagnosis not present

## 2021-05-14 DIAGNOSIS — R6520 Severe sepsis without septic shock: Secondary | ICD-10-CM | POA: Diagnosis present

## 2021-05-14 DIAGNOSIS — E785 Hyperlipidemia, unspecified: Secondary | ICD-10-CM | POA: Diagnosis present

## 2021-05-14 DIAGNOSIS — K8020 Calculus of gallbladder without cholecystitis without obstruction: Secondary | ICD-10-CM | POA: Diagnosis present

## 2021-05-14 DIAGNOSIS — Z8673 Personal history of transient ischemic attack (TIA), and cerebral infarction without residual deficits: Secondary | ICD-10-CM

## 2021-05-14 DIAGNOSIS — K5989 Other specified functional intestinal disorders: Secondary | ICD-10-CM | POA: Diagnosis not present

## 2021-05-14 DIAGNOSIS — M5126 Other intervertebral disc displacement, lumbar region: Secondary | ICD-10-CM | POA: Diagnosis not present

## 2021-05-14 DIAGNOSIS — I714 Abdominal aortic aneurysm, without rupture, unspecified: Secondary | ICD-10-CM | POA: Diagnosis present

## 2021-05-14 DIAGNOSIS — I5022 Chronic systolic (congestive) heart failure: Secondary | ICD-10-CM | POA: Diagnosis present

## 2021-05-14 DIAGNOSIS — G9341 Metabolic encephalopathy: Secondary | ICD-10-CM | POA: Diagnosis not present

## 2021-05-14 DIAGNOSIS — L89600 Pressure ulcer of unspecified heel, unstageable: Secondary | ICD-10-CM | POA: Diagnosis not present

## 2021-05-14 DIAGNOSIS — I2510 Atherosclerotic heart disease of native coronary artery without angina pectoris: Secondary | ICD-10-CM | POA: Diagnosis present

## 2021-05-14 DIAGNOSIS — K5900 Constipation, unspecified: Secondary | ICD-10-CM | POA: Diagnosis not present

## 2021-05-14 DIAGNOSIS — I110 Hypertensive heart disease with heart failure: Secondary | ICD-10-CM | POA: Diagnosis present

## 2021-05-14 DIAGNOSIS — E876 Hypokalemia: Secondary | ICD-10-CM | POA: Diagnosis present

## 2021-05-14 DIAGNOSIS — Z87891 Personal history of nicotine dependence: Secondary | ICD-10-CM

## 2021-05-14 DIAGNOSIS — R0902 Hypoxemia: Secondary | ICD-10-CM | POA: Diagnosis not present

## 2021-05-14 DIAGNOSIS — J90 Pleural effusion, not elsewhere classified: Secondary | ICD-10-CM | POA: Diagnosis not present

## 2021-05-14 DIAGNOSIS — E1151 Type 2 diabetes mellitus with diabetic peripheral angiopathy without gangrene: Secondary | ICD-10-CM | POA: Diagnosis present

## 2021-05-14 DIAGNOSIS — Z7982 Long term (current) use of aspirin: Secondary | ICD-10-CM

## 2021-05-14 DIAGNOSIS — E1165 Type 2 diabetes mellitus with hyperglycemia: Secondary | ICD-10-CM | POA: Diagnosis not present

## 2021-05-14 DIAGNOSIS — R600 Localized edema: Secondary | ICD-10-CM | POA: Diagnosis not present

## 2021-05-14 DIAGNOSIS — M869 Osteomyelitis, unspecified: Secondary | ICD-10-CM | POA: Diagnosis present

## 2021-05-14 DIAGNOSIS — K219 Gastro-esophageal reflux disease without esophagitis: Secondary | ICD-10-CM | POA: Diagnosis present

## 2021-05-14 DIAGNOSIS — J449 Chronic obstructive pulmonary disease, unspecified: Secondary | ICD-10-CM | POA: Diagnosis present

## 2021-05-14 DIAGNOSIS — M4696 Unspecified inflammatory spondylopathy, lumbar region: Secondary | ICD-10-CM | POA: Diagnosis not present

## 2021-05-14 DIAGNOSIS — Z20822 Contact with and (suspected) exposure to covid-19: Secondary | ICD-10-CM | POA: Diagnosis not present

## 2021-05-14 DIAGNOSIS — R000 Tachycardia, unspecified: Secondary | ICD-10-CM | POA: Diagnosis not present

## 2021-05-14 DIAGNOSIS — I5020 Unspecified systolic (congestive) heart failure: Secondary | ICD-10-CM | POA: Diagnosis not present

## 2021-05-14 DIAGNOSIS — R5383 Other fatigue: Secondary | ICD-10-CM | POA: Diagnosis not present

## 2021-05-14 DIAGNOSIS — L89623 Pressure ulcer of left heel, stage 3: Secondary | ICD-10-CM | POA: Diagnosis present

## 2021-05-14 DIAGNOSIS — M4628 Osteomyelitis of vertebra, sacral and sacrococcygeal region: Secondary | ICD-10-CM | POA: Diagnosis not present

## 2021-05-14 DIAGNOSIS — R4182 Altered mental status, unspecified: Secondary | ICD-10-CM | POA: Diagnosis not present

## 2021-05-14 DIAGNOSIS — K6389 Other specified diseases of intestine: Secondary | ICD-10-CM | POA: Diagnosis not present

## 2021-05-14 DIAGNOSIS — Z7189 Other specified counseling: Secondary | ICD-10-CM | POA: Diagnosis not present

## 2021-05-14 DIAGNOSIS — L03317 Cellulitis of buttock: Secondary | ICD-10-CM | POA: Diagnosis not present

## 2021-05-14 DIAGNOSIS — Z8249 Family history of ischemic heart disease and other diseases of the circulatory system: Secondary | ICD-10-CM

## 2021-05-14 DIAGNOSIS — Z833 Family history of diabetes mellitus: Secondary | ICD-10-CM

## 2021-05-14 DIAGNOSIS — I252 Old myocardial infarction: Secondary | ICD-10-CM

## 2021-05-14 DIAGNOSIS — M47816 Spondylosis without myelopathy or radiculopathy, lumbar region: Secondary | ICD-10-CM | POA: Diagnosis not present

## 2021-05-14 DIAGNOSIS — I959 Hypotension, unspecified: Secondary | ICD-10-CM | POA: Diagnosis not present

## 2021-05-14 DIAGNOSIS — G9340 Encephalopathy, unspecified: Secondary | ICD-10-CM | POA: Diagnosis not present

## 2021-05-14 DIAGNOSIS — I119 Hypertensive heart disease without heart failure: Secondary | ICD-10-CM | POA: Diagnosis present

## 2021-05-14 DIAGNOSIS — T83091A Other mechanical complication of indwelling urethral catheter, initial encounter: Secondary | ICD-10-CM | POA: Diagnosis present

## 2021-05-14 LAB — URINALYSIS, ROUTINE W REFLEX MICROSCOPIC
Glucose, UA: NEGATIVE mg/dL
Ketones, ur: NEGATIVE mg/dL
Nitrite: NEGATIVE
Protein, ur: 100 mg/dL — AB
Specific Gravity, Urine: >1.03 — ABNORMAL HIGH (ref 1.005–1.030)
pH: 6 (ref 5.0–8.0)

## 2021-05-14 LAB — COMPREHENSIVE METABOLIC PANEL
ALT: 26 U/L (ref 0–44)
AST: 27 U/L (ref 15–41)
Albumin: 2.1 g/dL — ABNORMAL LOW (ref 3.5–5.0)
Alkaline Phosphatase: 103 U/L (ref 38–126)
Anion gap: 11 (ref 5–15)
BUN: 23 mg/dL (ref 8–23)
CO2: 25 mmol/L (ref 22–32)
Calcium: 8.8 mg/dL — ABNORMAL LOW (ref 8.9–10.3)
Chloride: 98 mmol/L (ref 98–111)
Creatinine, Ser: 0.81 mg/dL (ref 0.61–1.24)
GFR, Estimated: >60 mL/min (ref >60)
Glucose, Bld: 142 mg/dL — ABNORMAL HIGH (ref 70–99)
Potassium: 3.4 mmol/L — ABNORMAL LOW (ref 3.5–5.1)
Sodium: 134 mmol/L — ABNORMAL LOW (ref 135–145)
Total Bilirubin: 1.1 mg/dL (ref 0.3–1.2)
Total Protein: 6.7 g/dL (ref 6.5–8.1)

## 2021-05-14 LAB — CBC WITH DIFFERENTIAL/PLATELET
Abs Immature Granulocytes: 0.09 10*3/uL — ABNORMAL HIGH (ref 0.00–0.07)
Basophils Absolute: 0 10*3/uL (ref 0.0–0.1)
Basophils Relative: 0 %
Eosinophils Absolute: 0 10*3/uL (ref 0.0–0.5)
Eosinophils Relative: 0 %
HCT: 41.5 % (ref 39.0–52.0)
Hemoglobin: 13.2 g/dL (ref 13.0–17.0)
Immature Granulocytes: 1 %
Lymphocytes Relative: 8 %
Lymphs Abs: 1.2 10*3/uL (ref 0.7–4.0)
MCH: 33.6 pg (ref 26.0–34.0)
MCHC: 31.8 g/dL (ref 30.0–36.0)
MCV: 105.6 fL — ABNORMAL HIGH (ref 80.0–100.0)
Monocytes Absolute: 1.2 10*3/uL — ABNORMAL HIGH (ref 0.1–1.0)
Monocytes Relative: 8 %
Neutro Abs: 12.6 10*3/uL — ABNORMAL HIGH (ref 1.7–7.7)
Neutrophils Relative %: 83 %
Platelets: 288 10*3/uL (ref 150–400)
RBC: 3.93 MIL/uL — ABNORMAL LOW (ref 4.22–5.81)
RDW: 14.3 % (ref 11.5–15.5)
WBC: 15.1 10*3/uL — ABNORMAL HIGH (ref 4.0–10.5)
nRBC: 0 % (ref 0.0–0.2)

## 2021-05-14 LAB — URINALYSIS, MICROSCOPIC (REFLEX)
RBC / HPF: >50 RBC/hpf (ref 0–5)
WBC, UA: >50 WBC/hpf (ref 0–5)

## 2021-05-14 MED ORDER — VANCOMYCIN HCL 750 MG/150ML IV SOLN
750.0000 mg | Freq: Two times a day (BID) | INTRAVENOUS | Status: DC
  Administered 2021-05-15 – 2021-05-17 (×5): 750 mg via INTRAVENOUS
  Filled 2021-05-14 (×5): qty 150

## 2021-05-14 MED ORDER — VANCOMYCIN HCL 1500 MG/300ML IV SOLN
1500.0000 mg | Freq: Once | INTRAVENOUS | Status: AC
  Administered 2021-05-15: 1500 mg via INTRAVENOUS
  Filled 2021-05-14: qty 300

## 2021-05-14 MED ORDER — METRONIDAZOLE 500 MG/100ML IV SOLN
500.0000 mg | Freq: Once | INTRAVENOUS | Status: AC
  Administered 2021-05-15: 500 mg via INTRAVENOUS
  Filled 2021-05-14: qty 100

## 2021-05-14 MED ORDER — SODIUM CHLORIDE 0.9 % IV SOLN
2.0000 g | Freq: Three times a day (TID) | INTRAVENOUS | Status: DC
  Administered 2021-05-15 – 2021-05-17 (×7): 2 g via INTRAVENOUS
  Filled 2021-05-14 (×7): qty 2

## 2021-05-14 MED ORDER — ACETAMINOPHEN 325 MG PO TABS
650.0000 mg | ORAL_TABLET | Freq: Once | ORAL | Status: AC
  Administered 2021-05-14: 650 mg via ORAL
  Filled 2021-05-14: qty 2

## 2021-05-14 MED ORDER — SODIUM CHLORIDE 0.9 % IV SOLN
2.0000 g | Freq: Once | INTRAVENOUS | Status: AC
  Administered 2021-05-15: 2 g via INTRAVENOUS
  Filled 2021-05-14: qty 2

## 2021-05-14 MED ORDER — LACTATED RINGERS IV BOLUS (SEPSIS)
500.0000 mL | Freq: Once | INTRAVENOUS | Status: AC
  Administered 2021-05-14: 500 mL via INTRAVENOUS

## 2021-05-14 MED ORDER — VANCOMYCIN HCL IN DEXTROSE 1-5 GM/200ML-% IV SOLN: 1000.0000 mg | Freq: Once | INTRAVENOUS | Status: DC

## 2021-05-14 NOTE — ED Notes (Signed)
Chris Payne [phone] would like an update

## 2021-05-14 NOTE — ED Notes (Signed)
Pt placed on 4L [place] due to o2 saturation 83%.

## 2021-05-14 NOTE — Sepsis Progress Note (Signed)
Following per sepsis protocol   

## 2021-05-14 NOTE — ED Triage Notes (Signed)
Pt biba from home with complaints of dark urine x 4 weeks, difficulty walking x 4 weeks.

## 2021-05-14 NOTE — ED Provider Notes (Signed)
[place] EMERGENCY DEPARTMENT Provider Note   CSN: [id number] Arrival date & time: 05/14/21  2125     History  Chief Complaint  Patient presents with   Weakness    Since dc from hospital 4 days ago     Chris Payne is a 71 y.o. male with a hx of CAD, CHF, COPD, GERD, PAD, prior stroke, and AAA presents to the ED via EMS for evaluation of persistent weakness. I called & spoke with patient's son Chris Payne via telephone for history- He reports the patient has had problems with weakness and has been nonambulatory over the past month or so, he lives at home and there is plans to set up home health and a lift but this has not been done yet. Since he is nonambulatory and his wife cannot lift him he has not been getting up. He has been complaining of increased pain to his ulcers. Today he seemed out of it, less active, and confused compared to baseline. They have not noted any respiratory issues, fevers, vomiting, or diarrhea. They have not noted any recent head injuries.   Patient reports he feels anxious, generally weak, and has had urinary frequency. He reports his lower back hurts. He answers variably and seems confused, level 5 caveat secondary to AMS.   HPI     Home Medications Prior to Admission medications   Medication Sig Start Date End Date Taking? Authorizing Provider  acetaminophen (TYLENOL) 500 MG tablet Take 1,000 mg by mouth every 6 (six) hours as needed for mild pain.    [provider]  albuterol (VENTOLIN HFA) 108 (90 Base) MCG/ACT inhaler Inhale 2 puffs into the lungs every 6 (six) hours as needed for wheezing or shortness of breath. 03/08/20   [place] Donnamarie Poag, DO  Amino Acids-Protein Hydrolys (FEEDING SUPPLEMENT, PRO-STAT SUGAR FREE 64,) LIQD Take 30 mLs by mouth 2 (two) times daily. Wound health    [provider]  aspirin 81 MG EC tablet Take 1 tablet (81 mg total) by mouth daily. 05/06/21   Eugenie Filler, MD   budesonide-formoterol [name]) 160-4.5 MCG/ACT inhaler Inhale 2 puffs into the lungs 2 (two) times daily. 03/08/20   [place] Donnamarie Poag, DO  collagenase (SANTYL) ointment Apply topically daily. 05/04/21   Eugenie Filler, MD  Ensure (ENSURE) Take 237 mLs by mouth 2 (two) times daily between meals.    [provider]  furosemide (LASIX) 20 MG tablet Take 1 tablet (20 mg total) by mouth daily. 02/10/20   Deno Etienne, DO  gabapentin (NEURONTIN) 300 MG capsule Take 300 mg by mouth 2 (two) times daily.    [provider]  metFORMIN (GLUCOPHAGE) 500 MG tablet Take 500 mg by mouth daily with supper.    [provider]  metoprolol tartrate (LOPRESSOR) 25 MG tablet Take 1 tablet (25 mg total) by mouth 2 (two) times daily. 03/08/20 04/28/21  [place] Donnamarie Poag, DO  pantoprazole (PROTONIX) 40 MG tablet Take 40 mg by mouth daily. 02/19/21   [provider]  rosuvastatin (CRESTOR) 40 MG tablet Take 1 tablet (40 mg total) by mouth daily. 03/08/20 04/28/21  [place] Donnamarie Poag, DO  simethicone (MYLICON) 80 MG chewable tablet Chew 2 tablets (160 mg total) by mouth 4 (four) times daily as needed for flatulence. 05/03/21   Eugenie Filler, MD  tamsulosin (FLOMAX) 0.4 MG CAPS capsule Take 1 capsule (0.4 mg total) by mouth daily. 03/29/21   Corky Sox, MD  Allergies    Patient has no known allergies.    Review of Systems   Review of Systems  Unable to perform ROS: Mental status change   Physical Exam Updated Vital Signs BP 110/74    Pulse (!) 109    Temp 98.7 F (37.1 C) (Oral)    Resp 18    SpO2 94%  Physical Exam Vitals and nursing note reviewed.  Constitutional:      Comments: Chronically ill appearing. Nontoxic  HENT:     Head: Normocephalic.  Eyes:     Pupils: Pupils are equal, round, and reactive to light.  Cardiovascular:     Rate and Rhythm: Normal rate and regular rhythm.  Pulmonary:     Effort: Pulmonary effort is normal. No respiratory distress.      Breath sounds: No wheezing.  Abdominal:     General: There is no distension.     Palpations: Abdomen is soft.     Tenderness: There is abdominal tenderness (lower abdominal tenderness to palpation). There is no guarding or rebound.  Genitourinary:    Comments: Chaperone present.  Large sacral ulcer present as pictured below. Purulence to bandages. Foley catheter in place.  Musculoskeletal:     Comments: Multiple abrasions to Les in various stage of healing.  Pitting edema to the feet. Ulcers present to the heels.   Skin:    General: Skin is warm and dry.  Neurological:     Mental Status: He is alert.     Comments: Clear speech. Oriented to person and place, disoriented to year, oriented to month. Moving all extremities.      ED Results / Procedures / Treatments   Labs (all labs ordered are listed, but only abnormal results are displayed) Labs Reviewed  COMPREHENSIVE METABOLIC PANEL - Abnormal; Notable for the following components:      Result Value   Sodium 134 (*)    Potassium 3.4 (*)    Glucose, Bld 142 (*)    Calcium 8.8 (*)    Albumin 2.1 (*)    All other components within normal limits  CBC WITH DIFFERENTIAL/PLATELET - Abnormal; Notable for the following components:   WBC 15.1 (*)    RBC 3.93 (*)    MCV 105.6 (*)    Neutro Abs 12.6 (*)    Monocytes Absolute 1.2 (*)    Abs Immature Granulocytes 0.09 (*)    All other components within normal limits  URINALYSIS, ROUTINE W REFLEX MICROSCOPIC - Abnormal; Notable for the following components:   Color, Urine ORANGE (*)    APPearance TURBID (*)    Specific Gravity, Urine >1.030 (*)    Hgb urine dipstick LARGE (*)    Bilirubin Urine SMALL (*)    Protein, ur 100 (*)    Leukocytes,Ua MODERATE (*)    All other components within normal limits  URINALYSIS, MICROSCOPIC (REFLEX) - Abnormal; Notable for the following components:   Bacteria, UA MANY (*)    All other components within normal limits  RESP PANEL BY RT-PCR (FLU  A&B, COVID) ARPGX2  CULTURE, BLOOD (ROUTINE X 2)  CULTURE, BLOOD (ROUTINE X 2)  URINE CULTURE  LACTIC ACID, PLASMA  LACTIC ACID, PLASMA  PROTIME-INR  APTT    EKG EKG Interpretation  Date/Time:  Wednesday May 14 2021 21:25:45 EST Ventricular Rate:  111 PR Interval:  158 QRS Duration: 98 QT Interval:  324 QTC Calculation: 441 R Axis:   -19 Text Interpretation: Sinus tachycardia Borderline left axis deviation Confirmed by Ralene Bathe,  Benjamine Mola [phone]) on 05/14/2021 10:54:15 PM  Radiology CT Abdomen Pelvis W Contrast  Result Date: 05/15/2021 CLINICAL DATA:  Abdominal pain, dark urine.  Difficulty walking. EXAM: CT ABDOMEN AND PELVIS WITH CONTRAST TECHNIQUE: Multidetector CT imaging of the abdomen and pelvis was performed using the standard protocol following bolus administration of intravenous contrast. RADIATION DOSE REDUCTION: This exam was performed according to the departmental dose-optimization program which includes automated exposure control, adjustment of the mA and/or kV according to patient size and/or use of iterative reconstruction technique. CONTRAST:  34mL OMNIPAQUE IOHEXOL 300 MG/ML  SOLN COMPARISON:  04/27/2021. FINDINGS: Lower chest: The heart is enlarged and there is no pericardial effusion. Scattered coronary artery calcifications are noted. There is aneurysmal dilatation of the ascending aorta measuring 4.1 cm. There is bilateral lower lobe bronchiectasis with bronchial wall thickening. Consolidation is present in the left lung base and patchy airspace disease is noted at the right lung base. No no pleural effusion. Hepatobiliary: Multiple scattered cysts are present in the liver and unchanged from the prior exam. The gallbladder is distended measuring 5.3 cm and a few stones are identified. No biliary ductal dilatation. Pancreas: There is fatty atrophy of the pancreas. No pancreatic ductal dilatation or surrounding fat stranding. Spleen: Normal in size without focal  abnormality. Adrenals/Urinary Tract: There is a stable nodule in the right adrenal gland measuring 1.5 cm. The left adrenal gland is within normal limits. No renal calculus or obstructive uropathy bilaterally. The kidneys enhance symmetrically. Subcentimeter hypodensities are present in the kidneys bilaterally and too small to further characterize. A Foley catheter is present in the urinary bladder and the bladder is nondistended. Stomach/Bowel: The stomach is within normal limits. No bowel obstruction, free air or pneumatosis. A normal appendix is present in the right lower quadrant. The rectum distends below the level of the pubococcygeal line suggesting pelvic floor dysfunction. There is a large amount of stool in the rectum. A few scattered diverticula are present along the colon without evidence of diverticulitis. No focal bowel wall thickening. Vascular/Lymphatic: Atherosclerotic calcification of the aorta. There is redemonstration of a saccular infrarenal abdominal aortic aneurysm measuring 5.1 cm, not significantly changed from the prior exam. Aortobifemoral bypass grafts are noted. There is severe stenosis at the left femoral graft anastomosis. No abdominal or pelvic lymphadenopathy. Reproductive: The prostate gland is enlarged and contains multiple brachytherapy seeds and/or surgical clips. Other: A fat containing umbilical hernia is noted.  No free fluid. Musculoskeletal: Fixation hardware is noted at the left hip. Degenerative changes are present in the thoracolumbar spine. Sternotomy wires are noted over the midline. There is a skin defect over the posterior aspect of the sacrum and coccyx, unchanged from the prior exam. IMPRESSION: 1. No renal calculus or obstructive uropathy bilaterally. A Foley catheter is present in the urinary bladder in the bladder is decompressed. 2. Bronchiectasis with left lower lobe consolidation and patchy airspace disease at the lung bases bilaterally, concerning for  pneumonia. 3. Distended gallbladder with cholelithiasis. 4. Large amount of stool in the rectum and moderate amount of retained stool in the colon, possible constipation. 5. Remaining chronic changes as described above. Electronically Signed   By: Brett Fairy M.D.   On: 05/15/2021 00:46   DG Chest Port 1 View  Result Date: 05/14/2021 CLINICAL DATA:  Possible sepsis EXAM: PORTABLE CHEST 1 VIEW COMPARISON:  04/27/2021 FINDINGS: Cardiac shadow is within normal limits. Postsurgical changes are noted. The lungs are well aerated bilaterally. Left basilar atelectasis is noted with mild blunting  of the costophrenic angle new from the prior exam. IMPRESSION: Left basilar atelectasis and small effusion. Electronically Signed   By: Inez Catalina M.D.   On: 05/14/2021 23:21    Procedures Procedures    Medications Ordered in ED Medications - No data to display  ED Course/ Medical Decision Making/ A&P                           Medical Decision Making Amount and/or Complexity of Data Reviewed Labs: ordered. Radiology: ordered. ECG/medicine tests: ordered.  Risk OTC drugs. Prescription drug management. Decision regarding hospitalization.   Patient presents to the ED with complaints of weakness, back pain, & AMS, this involves an extensive number of treatment options, and is a complaint that carries with it a high risk of complications and morbidity. Nontoxic, vitals with fever on rectal temp @ 100.6. Mild tachycardia quickly improved. Code sepsis initiated with broad spectrum abx and small fluid bolus. No 30 cc/kg bolus given not hypotensive at this time.   Additional history obtained:  Additional history obtained from patient's son via telephone & chart review.  External records from outside source obtained and reviewed including Recent admission 01/01-01/17 2023 - presented with hematuria with recent urinary retention requiring indwelling cathter--> found to have UTI with sepsis, urine culture  with multiple species, IV rocephin--> Omnicef and subsequent augmentin @ discharge for additional 4 days due to concerns for sacral cellulitis. Plan for outpatient urology follow up  EKG: Sinus tachycardia Borderline left axis deviation  Lab Tests:  I Ordered, reviewed, and interpreted labs, pertinent results include:  CBC: Leukocytosis  CMP: Hypoalbuminemia, mild electrolyte abnormalities noted Lactic acid: mild elevation.  Covid/flu: negative UA; Infected   Imaging Studies ordered:  I ordered imaging studies which included CXR & CT A/P,  I independently reviewed & interpreted imaging & am in agreement with radiology impression which shows:  CXR: Left basilar atelectasis and small effusion.  A/p CT: 1. No renal calculus or obstructive uropathy bilaterally. A Foley catheter is present in the urinary bladder in the bladder is decompressed. 2. Bronchiectasis with left lower lobe consolidation and patchy airspace disease at the lung bases bilaterally, concerning for pneumonia. 3. Distended gallbladder with cholelithiasis. 4. Large amount of stool in the rectum and moderate amount of retained stool in the colon, possible constipation. 5. Remaining chronic changes as described above.  ED Course:  Patient with findings consistent w/ sepsis, UA with infection, CT with findings of pneumonia, and sacral wound with concerning appearance, possibly multifactorial. Patient was on RA on my assessment however per nursing staff desaturating at times therefore placed on supplemental O2. Will discuss w/ medicine for admission. Discussed w/ attending who has evaluated the patient as shared visit & as in agreement. Updated patient's son Chris Payne via telephone.   Consultation:  Discussed with hospitalist Dr. Alcario Drought- accepts admission.   Critical Interventions: abx, fluids, oxygen  Portions of this note were generated with Dragon dictation software. Dictation errors may occur despite best attempts at  proofreading.         Final Clinical Impression(s) / ED Diagnoses Final diagnoses:  Sepsis, due to unspecified organism, unspecified whether acute organ dysfunction present [facility])  Acute UTI  Pressure injury of skin of sacral region, unspecified injury stage    Rx / DC Orders ED Discharge Orders     None         Amaryllis Dyke, PA-C 05/15/21 0434    Quintella Reichert,  MD 05/16/21 [id number]

## 2021-05-14 NOTE — ED Notes (Signed)
Unable to obtain blood cultures/ lab aware and will attempt

## 2021-05-14 NOTE — Progress Notes (Signed)
Pharmacy Antibiotic Note  Chris Payne is a 72 y.o. male admitted on 05/14/2021 presenting with generalized weakness and urinary frequency.  Pharmacy has been consulted for cefepime and vancomycin dosing.  Plan: Vancomycin 1500 mg Iv x 1, then 750 mg IV q 12h (eAUC 411) Cefepime 2g IV q 8 hours Monitor renal function, Cx and clinical progression to narrow Vancomycin levels as needed     Temp (24hrs), Avg:99.7 F (37.6 C), Min:98.7 F (37.1 C), Max:100.6 F (38.1 C)  Recent Labs  Lab 05/08/21 1145 05/14/21 2213  WBC 12.5* 15.1*  CREATININE 0.80 0.81    Estimated Creatinine Clearance: 78.2 mL/min (by C-G formula based on SCr of 0.81 mg/dL).    No Known Allergies  Bertis Ruddy, PharmD Clinical Pharmacist ED Pharmacist Phone # [phone] 05/14/2021 11:14 PM

## 2021-05-15 ENCOUNTER — Emergency Department (HOSPITAL_COMMUNITY): Payer: Medicare HMO

## 2021-05-15 ENCOUNTER — Inpatient Hospital Stay (HOSPITAL_COMMUNITY): Payer: Medicare HMO

## 2021-05-15 DIAGNOSIS — A419 Sepsis, unspecified organism: Secondary | ICD-10-CM | POA: Diagnosis present

## 2021-05-15 DIAGNOSIS — Z7984 Long term (current) use of oral hypoglycemic drugs: Secondary | ICD-10-CM | POA: Diagnosis not present

## 2021-05-15 DIAGNOSIS — K5900 Constipation, unspecified: Secondary | ICD-10-CM | POA: Diagnosis not present

## 2021-05-15 DIAGNOSIS — I5020 Unspecified systolic (congestive) heart failure: Secondary | ICD-10-CM | POA: Diagnosis not present

## 2021-05-15 DIAGNOSIS — Z515 Encounter for palliative care: Secondary | ICD-10-CM | POA: Diagnosis not present

## 2021-05-15 DIAGNOSIS — Y738 Miscellaneous gastroenterology and urology devices associated with adverse incidents, not elsewhere classified: Secondary | ICD-10-CM | POA: Diagnosis present

## 2021-05-15 DIAGNOSIS — Z66 Do not resuscitate: Secondary | ICD-10-CM | POA: Diagnosis not present

## 2021-05-15 DIAGNOSIS — L89600 Pressure ulcer of unspecified heel, unstageable: Secondary | ICD-10-CM

## 2021-05-15 DIAGNOSIS — G9341 Metabolic encephalopathy: Secondary | ICD-10-CM | POA: Diagnosis present

## 2021-05-15 DIAGNOSIS — E1169 Type 2 diabetes mellitus with other specified complication: Secondary | ICD-10-CM | POA: Diagnosis present

## 2021-05-15 DIAGNOSIS — M869 Osteomyelitis, unspecified: Secondary | ICD-10-CM | POA: Diagnosis present

## 2021-05-15 DIAGNOSIS — L89150 Pressure ulcer of sacral region, unstageable: Secondary | ICD-10-CM | POA: Diagnosis not present

## 2021-05-15 DIAGNOSIS — R339 Retention of urine, unspecified: Secondary | ICD-10-CM | POA: Diagnosis present

## 2021-05-15 DIAGNOSIS — N390 Urinary tract infection, site not specified: Secondary | ICD-10-CM | POA: Diagnosis not present

## 2021-05-15 DIAGNOSIS — Z978 Presence of other specified devices: Secondary | ICD-10-CM

## 2021-05-15 DIAGNOSIS — K6389 Other specified diseases of intestine: Secondary | ICD-10-CM | POA: Diagnosis not present

## 2021-05-15 DIAGNOSIS — K5989 Other specified functional intestinal disorders: Secondary | ICD-10-CM | POA: Diagnosis not present

## 2021-05-15 DIAGNOSIS — Z7982 Long term (current) use of aspirin: Secondary | ICD-10-CM | POA: Diagnosis not present

## 2021-05-15 DIAGNOSIS — J449 Chronic obstructive pulmonary disease, unspecified: Secondary | ICD-10-CM | POA: Diagnosis present

## 2021-05-15 DIAGNOSIS — R531 Weakness: Secondary | ICD-10-CM | POA: Diagnosis present

## 2021-05-15 DIAGNOSIS — M4628 Osteomyelitis of vertebra, sacral and sacrococcygeal region: Secondary | ICD-10-CM | POA: Diagnosis not present

## 2021-05-15 DIAGNOSIS — G9340 Encephalopathy, unspecified: Secondary | ICD-10-CM | POA: Diagnosis not present

## 2021-05-15 DIAGNOSIS — M47816 Spondylosis without myelopathy or radiculopathy, lumbar region: Secondary | ICD-10-CM | POA: Diagnosis not present

## 2021-05-15 DIAGNOSIS — R600 Localized edema: Secondary | ICD-10-CM | POA: Diagnosis not present

## 2021-05-15 DIAGNOSIS — Z79899 Other long term (current) drug therapy: Secondary | ICD-10-CM | POA: Diagnosis not present

## 2021-05-15 DIAGNOSIS — Z8673 Personal history of transient ischemic attack (TIA), and cerebral infarction without residual deficits: Secondary | ICD-10-CM | POA: Diagnosis not present

## 2021-05-15 DIAGNOSIS — I5022 Chronic systolic (congestive) heart failure: Secondary | ICD-10-CM | POA: Diagnosis present

## 2021-05-15 DIAGNOSIS — Z7189 Other specified counseling: Secondary | ICD-10-CM | POA: Diagnosis not present

## 2021-05-15 DIAGNOSIS — M4696 Unspecified inflammatory spondylopathy, lumbar region: Secondary | ICD-10-CM | POA: Diagnosis not present

## 2021-05-15 DIAGNOSIS — Z7951 Long term (current) use of inhaled steroids: Secondary | ICD-10-CM | POA: Diagnosis not present

## 2021-05-15 DIAGNOSIS — L89159 Pressure ulcer of sacral region, unspecified stage: Secondary | ICD-10-CM | POA: Diagnosis not present

## 2021-05-15 DIAGNOSIS — T83511A Infection and inflammatory reaction due to indwelling urethral catheter, initial encounter: Secondary | ICD-10-CM

## 2021-05-15 DIAGNOSIS — I2510 Atherosclerotic heart disease of native coronary artery without angina pectoris: Secondary | ICD-10-CM | POA: Diagnosis present

## 2021-05-15 DIAGNOSIS — L03317 Cellulitis of buttock: Secondary | ICD-10-CM | POA: Diagnosis not present

## 2021-05-15 DIAGNOSIS — L89154 Pressure ulcer of sacral region, stage 4: Secondary | ICD-10-CM | POA: Diagnosis present

## 2021-05-15 DIAGNOSIS — L89623 Pressure ulcer of left heel, stage 3: Secondary | ICD-10-CM | POA: Diagnosis present

## 2021-05-15 DIAGNOSIS — I714 Abdominal aortic aneurysm, without rupture, unspecified: Secondary | ICD-10-CM | POA: Diagnosis present

## 2021-05-15 DIAGNOSIS — R6520 Severe sepsis without septic shock: Secondary | ICD-10-CM

## 2021-05-15 DIAGNOSIS — Z951 Presence of aortocoronary bypass graft: Secondary | ICD-10-CM | POA: Diagnosis not present

## 2021-05-15 DIAGNOSIS — K5730 Diverticulosis of large intestine without perforation or abscess without bleeding: Secondary | ICD-10-CM | POA: Diagnosis not present

## 2021-05-15 DIAGNOSIS — E1151 Type 2 diabetes mellitus with diabetic peripheral angiopathy without gangrene: Secondary | ICD-10-CM | POA: Diagnosis present

## 2021-05-15 DIAGNOSIS — K8020 Calculus of gallbladder without cholecystitis without obstruction: Secondary | ICD-10-CM | POA: Diagnosis present

## 2021-05-15 DIAGNOSIS — Z20822 Contact with and (suspected) exposure to covid-19: Secondary | ICD-10-CM | POA: Diagnosis present

## 2021-05-15 DIAGNOSIS — M5126 Other intervertebral disc displacement, lumbar region: Secondary | ICD-10-CM | POA: Diagnosis not present

## 2021-05-15 DIAGNOSIS — I110 Hypertensive heart disease with heart failure: Secondary | ICD-10-CM | POA: Diagnosis present

## 2021-05-15 LAB — COMPREHENSIVE METABOLIC PANEL
ALT: 21 U/L (ref 0–44)
AST: 20 U/L (ref 15–41)
Albumin: 1.7 g/dL — ABNORMAL LOW (ref 3.5–5.0)
Alkaline Phosphatase: 86 U/L (ref 38–126)
Anion gap: 8 (ref 5–15)
BUN: 22 mg/dL (ref 8–23)
CO2: 26 mmol/L (ref 22–32)
Calcium: 8.2 mg/dL — ABNORMAL LOW (ref 8.9–10.3)
Chloride: 101 mmol/L (ref 98–111)
Creatinine, Ser: 0.66 mg/dL (ref 0.61–1.24)
GFR, Estimated: >60 mL/min (ref >60)
Glucose, Bld: 110 mg/dL — ABNORMAL HIGH (ref 70–99)
Potassium: 3.1 mmol/L — ABNORMAL LOW (ref 3.5–5.1)
Sodium: 135 mmol/L (ref 135–145)
Total Bilirubin: 0.6 mg/dL (ref 0.3–1.2)
Total Protein: 5.6 g/dL — ABNORMAL LOW (ref 6.5–8.1)

## 2021-05-15 LAB — CBC
HCT: 35.8 % — ABNORMAL LOW (ref 39.0–52.0)
Hemoglobin: 11 g/dL — ABNORMAL LOW (ref 13.0–17.0)
MCH: 32.6 pg (ref 26.0–34.0)
MCHC: 30.7 g/dL (ref 30.0–36.0)
MCV: 106.2 fL — ABNORMAL HIGH (ref 80.0–100.0)
Platelets: 265 10*3/uL (ref 150–400)
RBC: 3.37 MIL/uL — ABNORMAL LOW (ref 4.22–5.81)
RDW: 14.5 % (ref 11.5–15.5)
WBC: 11.9 10*3/uL — ABNORMAL HIGH (ref 4.0–10.5)
nRBC: 0 % (ref 0.0–0.2)

## 2021-05-15 LAB — RESP PANEL BY RT-PCR (FLU A&B, COVID) ARPGX2
Influenza A by PCR: NEGATIVE
Influenza B by PCR: NEGATIVE
SARS Coronavirus 2 by RT PCR: NEGATIVE

## 2021-05-15 LAB — CBG MONITORING, ED
Glucose-Capillary: 99 mg/dL (ref 70–99)
Glucose-Capillary: 95 mg/dL (ref 70–99)

## 2021-05-15 LAB — PROCALCITONIN: Procalcitonin: 0.83 ng/mL

## 2021-05-15 LAB — CORTISOL-AM, BLOOD: Cortisol - AM: 20 ug/dL (ref 6.7–22.6)

## 2021-05-15 LAB — PROTIME-INR
INR: 1.3 — ABNORMAL HIGH (ref 0.8–1.2)
Prothrombin Time: 16.5 seconds — ABNORMAL HIGH (ref 11.4–15.2)

## 2021-05-15 LAB — GLUCOSE, CAPILLARY: Glucose-Capillary: 118 mg/dL — ABNORMAL HIGH (ref 70–99)

## 2021-05-15 LAB — LACTIC ACID, PLASMA: Lactic Acid, Venous: 2 mmol/L (ref 0.5–1.9)

## 2021-05-15 MED ORDER — SENNA 8.6 MG PO TABS
1.0000 | ORAL_TABLET | Freq: Two times a day (BID) | ORAL | Status: DC
  Administered 2021-05-15 – 2021-05-17 (×4): 8.6 mg via ORAL
  Filled 2021-05-15 (×5): qty 1

## 2021-05-15 MED ORDER — ONDANSETRON HCL 4 MG PO TABS: 4.0000 mg | ORAL_TABLET | Freq: Four times a day (QID) | ORAL | Status: DC | PRN

## 2021-05-15 MED ORDER — ACETAMINOPHEN 325 MG PO TABS: 650.0000 mg | ORAL_TABLET | Freq: Four times a day (QID) | ORAL | Status: DC | PRN

## 2021-05-15 MED ORDER — POTASSIUM CHLORIDE 20 MEQ PO PACK
40.0000 meq | PACK | Freq: Once | ORAL | Status: AC
  Administered 2021-05-15: 40 meq via ORAL
  Filled 2021-05-15: qty 2

## 2021-05-15 MED ORDER — CHLORHEXIDINE GLUCONATE CLOTH 2 % EX PADS
6.0000 | MEDICATED_PAD | Freq: Every day | CUTANEOUS | Status: DC
  Administered 2021-05-15 – 2021-05-17 (×3): 6 via TOPICAL

## 2021-05-15 MED ORDER — ONDANSETRON HCL 4 MG/2ML IJ SOLN: 4.0000 mg | Freq: Four times a day (QID) | INTRAMUSCULAR | Status: DC | PRN

## 2021-05-15 MED ORDER — ACETAMINOPHEN 650 MG RE SUPP: 650.0000 mg | Freq: Four times a day (QID) | RECTAL | Status: DC | PRN

## 2021-05-15 MED ORDER — INSULIN ASPART 100 UNIT/ML IJ SOLN: 0.0000 [IU] | Freq: Three times a day (TID) | INTRAMUSCULAR | Status: DC

## 2021-05-15 MED ORDER — COLLAGENASE 250 UNIT/GM EX OINT
TOPICAL_OINTMENT | Freq: Every day | CUTANEOUS | Status: DC
  Filled 2021-05-15: qty 30

## 2021-05-15 MED ORDER — TAMSULOSIN HCL 0.4 MG PO CAPS
0.4000 mg | ORAL_CAPSULE | Freq: Every day | ORAL | Status: DC
  Administered 2021-05-15 – 2021-05-17 (×2): 0.4 mg via ORAL
  Filled 2021-05-15 (×3): qty 1

## 2021-05-15 MED ORDER — FLEET ENEMA 7-19 GM/118ML RE ENEM
1.0000 | ENEMA | Freq: Once | RECTAL | Status: AC
  Administered 2021-05-15: 1 via RECTAL
  Filled 2021-05-15: qty 1

## 2021-05-15 MED ORDER — ENOXAPARIN SODIUM 40 MG/0.4ML IJ SOSY
40.0000 mg | PREFILLED_SYRINGE | INTRAMUSCULAR | Status: DC
  Administered 2021-05-15 – 2021-05-17 (×3): 40 mg via SUBCUTANEOUS
  Filled 2021-05-15 (×3): qty 0.4

## 2021-05-15 MED ORDER — DAKINS (1/4 STRENGTH) 0.125 % EX SOLN
Freq: Two times a day (BID) | CUTANEOUS | Status: DC
  Administered 2021-05-15: 1
  Filled 2021-05-15: qty 473

## 2021-05-15 MED ORDER — DOCUSATE SODIUM 100 MG PO CAPS
100.0000 mg | ORAL_CAPSULE | Freq: Two times a day (BID) | ORAL | Status: DC
  Administered 2021-05-15 – 2021-05-17 (×4): 100 mg via ORAL
  Filled 2021-05-15 (×5): qty 1

## 2021-05-15 MED ORDER — LACTATED RINGERS IV SOLN: INTRAVENOUS | Status: DC

## 2021-05-15 MED ORDER — FLEET ENEMA 7-19 GM/118ML RE ENEM: 1.0000 | ENEMA | Freq: Once | RECTAL | Status: DC | PRN

## 2021-05-15 MED ORDER — IOHEXOL 300 MG/ML  SOLN
80.0000 mL | Freq: Once | INTRAMUSCULAR | Status: AC | PRN
  Administered 2021-05-15: 80 mL via INTRAVENOUS

## 2021-05-15 NOTE — ED Notes (Addendum)
Cleaned pt's coccyx wound w/NS and applied santyl ointment and padded dressing

## 2021-05-15 NOTE — ED Notes (Signed)
Patient transported to MRI 

## 2021-05-15 NOTE — ED Notes (Signed)
Placed pt on left side to relieve pressure from coccyx wound.

## 2021-05-15 NOTE — Consult Note (Addendum)
WOC Nurse Consult Note: Patient receiving care in [facility] [id number] This patient is well known to the [place] team from previous admissions at [facility] and [facility]. His wounds continue to worsen and this admission he has multiple wounds, skin tears and soiled dressings as well as dressings that were stuck to his skin.  Reason for Consult: Multiple wounds Wound type: Unstageable sacral wound 50% black 50% red/pink/yellow evolving to a wider area of the sacrum with bloody malodorous drainage. Stool on the dressing. (See photo in media tab) Skin tears on the left upper arm with partial flap approx 8 x 7, right forearm, LLE, left knee, RLE lateral side of the leg.  Excoriated skin on the right upper thigh from the foley securement device (this had been moved to the left leg) DTI to bilateral heels with an evolving PI on the left heel that is now a stage 2. Pressure Injury POA: Yes Dressing procedure/placement/frequency: Sacral wound: Hydrotherapy M-Sa followed by [facility] x 5 days. Once this order expires we will continue with Santyl dressing changes twice daily. BEDSIDE NURSE TO CHANGE DRESSING ON SUNDAYS. Skin tears: Place a cut to fit piece of Xeroform gauze over the wound and cover with a foam dressing. The right upper arm will require wrapping with Kerlix instead of foam dressing.  Right upper thigh excoriation will need no treatment at this time. May apply Sween moisturizing lotion (pink top in clean supply) over this daily.  Bilateral heels: Clean with soap and water. Place a heel foam dressing over the right heel. Assess daily and change every 3 days.  Apply a piece of Xeroform gauze over the left heel then cover with a heel foam dressing. Change Xeroform daily. Change foam dressing PRN or every 3 days. Place both feet in [employer]. Mattress Replacement to a standard [employer] has been ordered.  Turn every 2 hours.  Nutrition consult recommended.   Monitor the wound area(s) for worsening of condition such  as: Signs/symptoms of infection, increase in size, development of or worsening of odor, development of pain, or increased pain at the affected locations.   Notify the medical team if any of these develop.  Consider Additional Pressure Injury Prevention Measures such as:  Support surfaces (air mattress) chair cushion Kellie Simmering # [phone]) Heel offloading boots Kellie Simmering # [phone]) Turning and Positioning  Measures to reduce shear (draw sheet, knees up) Skin protection Products (Foam dressing) Moisture management products (Critic-Aid Barrier Cream-Purple top) Sween moisturizing lotion (Pink top in clean supply) Nutrition Management Protection for Medical Devices Routine Skin Assessment   Thank you for the consult. [place] nurse will not follow at this time.   Please re-consult the [place] team if needed.  Cathlean Marseilles Tamala Julian, MSN, RN, [place], Lysle Pearl, [facility] Wound Treatment Associate Pager [phone]

## 2021-05-15 NOTE — Evaluation (Signed)
Occupational Therapy Evaluation Patient Details Name: Chris Payne MRN: [id number] DOB: 12/19/1950 Today's Date: 05/15/2021   History of Present Illness 71 y.o. male with medical history significant of CAD, HFrEF, COPD, prior stroke, AAA, indwelling foley catheter chronically.  Pt admitted earlier this month for CAUTI and cellulitis of sacral decubitus.  Pt presented to [facility] with persistent weakness.   Clinical Impression   Patient admitted for the above diagnosis.  PTA he lives at home, and has regressed to a nearly dependent care at bedlevel.  Deficits impacting his functional status are listed below.  Currently he is Max A for bed mobility/rolling due to pain, and total assist for lower body ADL/toileting at bedlevel.  OT to follow along in the acute setting at 1x/wk, and SNF is recommended due to the heavy level of care the patient requires.  Home is a possibility if family can assume 24 hour Max A to near dependent care.  [place] OT would only be appropriate for family training.  Given the intermittent level of care HH provides, the patient would not progress functionally.       Recommendations for follow up therapy are one component of a multi-disciplinary discharge planning process, led by the attending physician.  Recommendations may be updated based on patient status, additional functional criteria and insurance authorization.   Follow Up Recommendations  Skilled nursing-short term rehab (<3 hours/day)    Assistance Recommended at Discharge Frequent or constant Supervision/Assistance  Patient can return home with the following A lot of help with bathing/dressing/bathroom;Two people to help with walking and/or transfers;Direct supervision/assist for medications management;Assistance with cooking/housework;Direct supervision/assist for financial management;Help with stairs or ramp for entrance;Assist for transportation    Functional Status Assessment  Patient has had a recent decline in  their functional status and demonstrates the ability to make significant improvements in function in a reasonable and predictable amount of time.  Equipment Recommendations  None recommended by OT    Recommendations for Other Services       Precautions / Restrictions Precautions Precautions: Fall Restrictions Weight Bearing Restrictions: No Other Position/Activity Restrictions: ? OOB tolerance in chair activity due to sacral wound.  Bilateral heel wounds.  Very fragile skin.      Mobility Bed Mobility               General bed mobility comments: patient deferred due to vauge complaints of stomach pain - RN notified    Transfers                          Balance                                           ADL either performed or assessed with clinical judgement   ADL   Eating/Feeding: Set up;Bed level   Grooming: Set up;Bed level   Upper Body Bathing: Moderate assistance;Bed level   Lower Body Bathing: Total assistance;Bed level   Upper Body Dressing : Bed level;Moderate assistance   Lower Body Dressing: Total assistance;Bed level     Toilet Transfer Details (indicate cue type and reason): unable Toileting- Clothing Manipulation and Hygiene: Total assistance;Bed level               Vision   Vision Assessment?: No apparent visual deficits     Perception Perception Perception: Not tested   Praxis Praxis  Praxis: Not tested    Pertinent Vitals/Pain Pain Assessment Pain Assessment: Faces Faces Pain Scale: Hurts even more Pain Location: sacrum Pain Descriptors / Indicators: Grimacing Pain Intervention(s): Monitored during session     Hand Dominance Right   Extremity/Trunk Assessment Upper Extremity Assessment Upper Extremity Assessment: Generalized weakness RUE Deficits / Details: WFL ROM, grossly 3+/5 strength throughout, limited shoulder flexion LUE Deficits / Details: WFL ROM, grossly 3+/5 strength throughout,  limited end range shoulder flexion   Lower Extremity Assessment Lower Extremity Assessment: Defer to PT evaluation   Cervical / Trunk Assessment Cervical / Trunk Assessment: Kyphotic;Other exceptions Cervical / Trunk Exceptions: Patient only able to turn his head to midline R due to tightness.  Position of comfort is turned L.   Communication Communication Communication: No difficulties   Cognition Arousal/Alertness: Awake/alert Behavior During Therapy: WFL for tasks assessed/performed Overall Cognitive Status: History of cognitive impairments - at baseline                                 General Comments: Patient oriented to self only.  Follows one step commands.  Decreased complex thought and problem solving.     General Comments       Exercises     Shoulder Instructions      Home Living Family/patient expects to be discharged to:: Private residence Living Arrangements: Spouse/significant other;Other relatives Available Help at Discharge: Family;Available 24 hours/day Type of Home: House Home Access: Ramped entrance   Entrance Stairs-Rails: None Home Layout: Two level;Able to live on main level with bedroom/bathroom     Bathroom Shower/Tub: Sponge bathes at baseline;Tub/shower unit   Bathroom Toilet: Standard Bathroom Accessibility: Yes   Home Equipment: Conservation officer, nature (2 wheels);Wheelchair - manual;Cane - single point;Hospital bed          Prior Functioning/Environment Prior Level of Function : Needs assist       Physical Assist : ADLs (physical);Mobility (physical) Mobility (physical): Transfers;Bed mobility ADLs (physical): Bathing;Dressing;IADLs;Toileting Mobility Comments: Bed to wheelchair only at home.  Mostly bedlevel ADLs Comments: max-total assist at bed level, patient has not been gettng OOB        OT Problem List: Decreased strength;Decreased activity tolerance;Impaired balance (sitting and/or standing);Decreased  cognition;Pain;Decreased safety awareness;Decreased range of motion      OT Treatment/Interventions: Self-care/ADL training;Therapeutic exercise;Therapeutic activities;Balance training;DME and/or AE instruction    OT Goals(Current goals can be found in the care plan section) Acute Rehab OT Goals Patient Stated Goal: Return home OT Goal Formulation: With patient Time For Goal Achievement: 05/29/21 Potential to Achieve Goals: Fair  OT Frequency: Min 1X/week    Co-evaluation              AM-PAC OT "6 Clicks" Daily Activity     Outcome Measure Help from another person eating meals?: A Little Help from another person taking care of personal grooming?: A Little Help from another person toileting, which includes using toliet, bedpan, or urinal?: Total Help from another person bathing (including washing, rinsing, drying)?: A Lot Help from another person to put on and taking off regular upper body clothing?: A Lot Help from another person to put on and taking off regular lower body clothing?: Total 6 Click Score: 12   End of Session    Activity Tolerance: Patient limited by pain Patient left: in bed;with call bell/phone within reach  OT Visit Diagnosis: Muscle weakness (generalized) (M62.81);Other symptoms and signs involving cognitive function;Other abnormalities of  gait and mobility (R26.89)                Time: 1431-1450 OT Time Calculation (min): 19 min Charges:  OT General Charges $OT Visit: 1 Visit OT Evaluation $OT Eval Moderate Complexity: 1 Mod  05/15/2021  RP, OTR/L  Acute Rehabilitation Services  Office:  [phone]   Metta Clines 05/15/2021, 3:12 PM

## 2021-05-15 NOTE — ED Notes (Signed)
Pt cleaned up of moderate BM. Fresh brief placed on pt. Padded dressing C/D/I. Dressing pulled back, Dakin's applied, dressing replaced. Pt tolerated well.

## 2021-05-15 NOTE — ED Notes (Signed)
Lanny Hurst EMT and I gave pt a bed bath. I cleaned the skin tears with soap and water and applied vaseline gauze and dry dressing on the right upper arm and vaseline gauze and padded dressing on the others.

## 2021-05-15 NOTE — ED Notes (Signed)
I encouraged pt to eat breakfast, I even tried to feed him, but pt stated he didn't have an appetite and didn't want to eat.

## 2021-05-15 NOTE — ED Notes (Signed)
Patient refusing to be "stuck" for second blood culture, MD aware

## 2021-05-15 NOTE — ED Notes (Signed)
Pt has a large skin tear on right upper arm. Small skin tear on left chest, small skin tear on left forearm, 2 small skin tear on right leg one on knee and one above knee. Pt has deep tissue injuries on heels bilat. 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

## 2021-05-15 NOTE — Progress Notes (Signed)
PROGRESS NOTE    Chris Payne   FHL:[id number]  DOB: [birth year]  DOA: 05/14/2021 PCP: Benito Mccreedy, MD   Brief Narrative:  LORING LISKEY is a 71 year old male who lives at home has BPH with urinary retention and a chronic Foley, stage IV sacral decubitus ulcer, PAD, CVA, CAD status post CABG, HFrEF, hypertension.  He is bedbound.  He was in the hospital from 04/27/2021 through 05/03/2021 with sepsis thought to be secondary to a UTI.  He was discharged home with 4 days of Augmentin.  He returns for pain in the Foley catheter.  He also states to me that he has pain in his bottom.    In ED: Foley was replaced over a liter of urine was obtained. He was noted to have a temperature of 100.6 rectally He was given Vanco and cefepime and an MRI of the sacrum was ordered to look for osteomyelitis.  Subjective: Appears quite confused.  States his bottom hurts.    Assessment & Plan:   Principal Problem:   Sepsis ([place]) likely secondary to infection of stage IV sacral ulcer -Has leukocytosis, had tachycardia and a fever - As he has a chronic Foley, UA is likely going to be unreliable - Could be that his decubitus ulcer that is infected and MRI does show osteomyelitis -He lives at home with stepson and wife, is unable to be turned and this is likely not going to heal -I have spoken with his son today who states hospice came in to speak with them but he wanted to speak with others before he made a final decision - I will go ahead and ask for a palliative care consult to speak with the son and help make a decision for him- he states he is the main decision maker and not the patient's wife as she gets confused- seems he is a candidate for hospice home  Active Problems: Chronic Foley with obstruction -Foley changed in ED with a good amount of urine output subsequently -He is on Flomax at home and I will continue this    Hypertension  -His BP is low and therefore we will hold metoprolol  and Lasix -His last echo in 11/22 showed a normal EF and grade 1 diastolic dysfunction    Pressure injury of heel, unstageable ([place]) -Continue wound care per nursing    Constipation -Given enema in the ED    Diabetes mellitus, with hyperglycemia - He takes metformin at home - Continue sliding scale insulin in the hospital Hemoglobin A1C    Component Value Date/Time   HGBA1C 8.2 (H) 03/26/2021 2028     DVT prophylaxis: enoxaparin (LOVENOX) injection 40 mg Start: 05/15/21 0800   Code Status: Full code Family Communication: [name] step son- [phone] Level of Care: Level of care: Telemetry Medical Disposition Plan:  Status is: Inpatient  Remains inpatient appropriate because: cont IV abx while waiting on palliative care discussions      Consultants:  Palliative care Procedures:  none Antimicrobials:  Anti-infectives (From admission, onward)    Start     Dose/Rate Route Frequency Ordered Stop   05/15/21 1200  vancomycin (VANCOREADY) IVPB 750 mg/150 mL        750 mg 150 mL/hr over 60 Minutes Intravenous Every 12 hours 05/14/21 2319     05/15/21 0600  ceFEPIme (MAXIPIME) 2 g in sodium chloride 0.9 % 100 mL IVPB        2 g 200 mL/hr over 30 Minutes Intravenous Every 8 hours  05/14/21 2319     05/14/21 2315  ceFEPIme (MAXIPIME) 2 g in sodium chloride 0.9 % 100 mL IVPB        2 g 200 mL/hr over 30 Minutes Intravenous  Once 05/14/21 2310 05/15/21 0129   05/14/21 2315  metroNIDAZOLE (FLAGYL) IVPB 500 mg        500 mg 100 mL/hr over 60 Minutes Intravenous  Once 05/14/21 2310 05/15/21 0232   05/14/21 2315  vancomycin (VANCOCIN) IVPB 1000 mg/200 mL premix  Status:  Discontinued        1,000 mg 200 mL/hr over 60 Minutes Intravenous  Once 05/14/21 2310 05/14/21 2312   05/14/21 2315  vancomycin (VANCOREADY) IVPB 1500 mg/300 mL        1,500 mg 150 mL/hr over 120 Minutes Intravenous  Once 05/14/21 2312 05/15/21 0434        Objective: Vitals:   05/15/21 0737  05/15/21 0800 05/15/21 0900 05/15/21 1045  BP:  105/72 (!) 101/57 (!) 115/57  Pulse:  85 80 85  Resp:  (!) 22 18 17   Temp: 98.2 F (36.8 C)     TempSrc: Oral     SpO2:  95% 94% 100%    Intake/Output Summary (Last 24 hours) at 05/15/2021 1248 Last data filed at 05/15/2021 1229 Gross per 24 hour  Intake 250 ml  Output --  Net 250 ml   There were no vitals filed for this visit.  Examination: General exam: Appears comfortable  HEENT: PERRLA, oral mucosa moist, no sclera icterus or thrush Respiratory system: Clear to auscultation. Respiratory effort normal. Cardiovascular system: S1 & S2 heard, RRR.   Gastrointestinal system: Abdomen soft, non-tender, nondistended. Normal bowel sounds. Central nervous system: Alert and oriented to person but not place or time. No focal neurological deficits. Extremities: No cyanosis, clubbing - 2 + pedal  edema Skin: skin tears and abrasion on body, numerous bruises   Psychiatry:  Mood & affect appropriate.     Data Reviewed: I have personally reviewed following labs and imaging studies  CBC: Recent Labs  Lab 05/14/21 2213 05/15/21 0432  WBC 15.1* 11.9*  NEUTROABS 12.6*  --   HGB 13.2 11.0*  HCT 41.5 35.8*  MCV 105.6* 106.2*  PLT 288 937   Basic Metabolic Panel: Recent Labs  Lab 05/14/21 2213 05/15/21 0432  NA 134* 135  K 3.4* 3.1*  CL 98 101  CO2 25 26  GLUCOSE 142* 110*  BUN 23 22  CREATININE 0.81 0.66  CALCIUM 8.8* 8.2*   GFR: Estimated Creatinine Clearance: 79.2 mL/min (by C-G formula based on SCr of 0.66 mg/dL). Liver Function Tests: Recent Labs  Lab 05/14/21 2213 05/15/21 0432  AST 27 20  ALT 26 21  ALKPHOS 103 86  BILITOT 1.1 0.6  PROT 6.7 5.6*  ALBUMIN 2.1* 1.7*   No results for input(s): LIPASE, AMYLASE in the last 168 hours. No results for input(s): AMMONIA in the last 168 hours. Coagulation Profile: Recent Labs  Lab 05/15/21 0432  INR 1.3*   Cardiac Enzymes: No results for input(s): CKTOTAL,  CKMB, CKMBINDEX, TROPONINI in the last 168 hours. BNP (last 3 results) No results for input(s): PROBNP in the last 8760 hours. HbA1C: No results for input(s): HGBA1C in the last 72 hours. CBG: Recent Labs  Lab 05/15/21 0829 05/15/21 1119  GLUCAP 99 95   Lipid Profile: No results for input(s): CHOL, HDL, LDLCALC, TRIG, CHOLHDL, LDLDIRECT in the last 72 hours. Thyroid Function Tests: No results for input(s): TSH, T4TOTAL, FREET4,  T3FREE, THYROIDAB in the last 72 hours. Anemia Panel: No results for input(s): VITAMINB12, FOLATE, FERRITIN, TIBC, IRON, RETICCTPCT in the last 72 hours. Urine analysis:    Component Value Date/Time   COLORURINE ORANGE (A) 05/14/2021 2159   APPEARANCEUR TURBID (A) 05/14/2021 2159   LABSPEC >1.030 (H) 05/14/2021 2159   PHURINE 6.0 05/14/2021 2159   GLUCOSEU NEGATIVE 05/14/2021 2159   HGBUR LARGE (A) 05/14/2021 2159   BILIRUBINUR SMALL (A) 05/14/2021 2159   BILIRUBINUR small 08/23/2013 [place] 05/14/2021 2159   PROTEINUR 100 (A) 05/14/2021 2159   UROBILINOGEN 1.0 02/08/2015 1902   NITRITE NEGATIVE 05/14/2021 2159   LEUKOCYTESUR MODERATE (A) 05/14/2021 2159   Sepsis Labs: @LABRCNTIP (procalcitonin:4,lacticidven:4) ) Recent Results (from the past 240 hour(s))  Urine Culture     Status: Abnormal   Collection Time: 05/08/21 11:31 AM   Specimen: Urine, Catheterized  Result Value Ref Range Status   Specimen Description   Final    URINE, CATHETERIZED Performed at [facility], [place] [address] [place], [place] [address]    Special Requests   Final    NONE Performed at [facility], [place] [address], [place], [place] [address]    Culture >=100,000 COLONIES/mL YEAST (A)  Final   Report Status 05/10/2021 FINAL  Final  Resp Panel by RT-PCR (Flu A&B, Covid) Nasopharyngeal Swab     Status: None   Collection Time: 05/14/21 11:10 PM   Specimen: Nasopharyngeal Swab; Nasopharyngeal(NP) swabs in vial  transport medium  Result Value Ref Range Status   SARS Coronavirus 2 by RT PCR NEGATIVE NEGATIVE Final    Comment: (NOTE) SARS-CoV-2 target nucleic acids are NOT DETECTED.  The SARS-CoV-2 RNA is generally detectable in upper respiratory specimens during the acute phase of infection. The lowest concentration of SARS-CoV-2 viral copies this assay can detect is 138 copies/mL. A negative result does not preclude SARS-Cov-2 infection and should not be used as the sole basis for treatment or other patient management decisions. A negative result may occur with  improper specimen collection/handling, submission of specimen other than nasopharyngeal swab, presence of viral mutation(s) within the areas targeted by this assay, and inadequate number of viral copies(<138 copies/mL). A negative result must be combined with clinical observations, patient history, and epidemiological information. The expected result is Negative.  Fact Sheet for Patients:  EntrepreneurPulse.com.au  Fact Sheet for Healthcare Providers:  IncredibleEmployment.be  This test is no t yet approved or cleared by the [place] FDA and  has been authorized for detection and/or diagnosis of SARS-CoV-2 by FDA under an Emergency Use Authorization (EUA). This EUA will remain  in effect (meaning this test can be used) for the duration of the COVID-19 declaration under Section 564(b)(1) of the Act, 21 U.S.C.section 360bbb-3(b)(1), unless the authorization is terminated  or revoked sooner.       Influenza A by PCR NEGATIVE NEGATIVE Final   Influenza B by PCR NEGATIVE NEGATIVE Final    Comment: (NOTE) The Xpert Xpress SARS-CoV-2/FLU/RSV plus assay is intended as an aid in the diagnosis of influenza from Nasopharyngeal swab specimens and should not be used as a sole basis for treatment. Nasal washings and aspirates are unacceptable for Xpert Xpress SARS-CoV-2/FLU/RSV testing.  Fact  Sheet for Patients: EntrepreneurPulse.com.au  Fact Sheet for Healthcare Providers: IncredibleEmployment.be  This test is not yet approved or cleared by the [place] FDA and has been authorized for detection and/or diagnosis of SARS-CoV-2 by FDA under an Emergency Use Authorization (EUA).  This EUA will remain in effect (meaning this test can be used) for the duration of the COVID-19 declaration under Section 564(b)(1) of the Act, 21 U.S.C. section 360bbb-3(b)(1), unless the authorization is terminated or revoked.  Performed at [facility], [place] [address] [place], [place] [address]   Blood Culture (routine x 2)     Status: None (Preliminary result)   Collection Time: 05/15/21 12:43 AM   Specimen: BLOOD  Result Value Ref Range Status   Specimen Description BLOOD BLOOD LEFT WRIST  Final   Special Requests   Final    BOTTLES DRAWN AEROBIC AND ANAEROBIC Blood Culture adequate volume   Culture   Final    NO GROWTH < 12 HOURS Performed at [facility], [place] [address] [place], [place] [address]    Report Status PENDING  Incomplete         Radiology Studies: MR LUMBAR SPINE WO CONTRAST  Result Date: 05/15/2021 CLINICAL DATA:  Low back pain, infection suspected EXAM: MRI LUMBAR SPINE WITHOUT CONTRAST TECHNIQUE: Multiplanar, multisequence MR imaging of the lumbar spine was performed. No intravenous contrast was administered. COMPARISON:  Lumbar spine MRI 08/16/2015, same-day CT abdomen/pelvis FINDINGS: Segmentation: Standard; the lowest formed disc space is designated L5-S1. Alignment: There is trace retrolisthesis of L5 on S1, unchanged since 2017. Alignment at the other levels is normal. Vertebrae: Vertebral body heights are preserved. A focus of T1 and T2 hyperintensity in the L3 vertebral body is unchanged, likely an intraosseous hemangioma. There is no suspicious marrow signal abnormality. There is no marrow edema. There is a  prominent Schmorl's node indenting the superior L5 endplate without associated edema. There is edema in the lower sacrum with surrounding soft tissue edema, better evaluated on the separately dictated sacrum MRI. There is no osseous destruction to suggest discitis/osteomyelitis. Conus medullaris and cauda equina: Conus extends to the L1 level. Conus and cauda equina appear normal. Paraspinal and other soft tissues: The paraspinal soft tissues are unremarkable. A saccular infrarenal abdominal aortic aneurysm is again seen, better evaluated on the same-day CT abdomen/pelvis. Disc levels: There is marked intervertebral disc space narrowing at L5-S1. Facet arthropathy is most advanced at L4-L5 T12-L1: There is mild bilateral facet arthropathy without significant spinal canal or neural foraminal stenosis L1-L2: Mild degenerative endplate change and bilateral facet arthropathy without significant spinal canal or neural foraminal stenosis. L2-L3: There is a mild disc bulge and mild bilateral facet arthropathy without significant spinal canal or neural foraminal stenosis. L3-L4: There is a mild disc bulge and mild bilateral facet arthropathy resulting in mild left worse than right neural foraminal stenosis without significant spinal canal stenosis L4-L5: There is a mild disc bulge and moderate bilateral facet arthropathy resulting in mild bilateral neural foraminal stenosis without significant spinal canal stenosis L5-S1: There is a diffuse disc bulge with bilateral foraminal components, degenerative endplate change, and bilateral facet arthropathy resulting in moderate right and mild left neural foraminal stenosis. Overall, the above findings are not significantly changed since 2017. IMPRESSION: 1. Bone and soft tissue edema in the lower sacrum consistent with osteomyelitis, better evaluated on the separately dictated sacrum MRI. 2. No other evidence of discitis/osteomyelitis in the lumbar spine. 3. Moderate facet  arthropathy at L4-L5. 4. Degenerative changes at L5-S1 result in moderate right and mild left neural foraminal stenosis, not significantly changed since 2017. 5. Mild degenerative changes at the remaining levels as detailed above without significant spinal canal or neural foraminal stenosis, also not significantly changed since 2017.  6. Saccular infrarenal abdominal aortic aneurysm, better evaluated on prior CT. Electronically Signed   By: Valetta Mole M.D.   On: 05/15/2021 08:25   CT Abdomen Pelvis W Contrast  Result Date: 05/15/2021 CLINICAL DATA:  Abdominal pain, dark urine.  Difficulty walking. EXAM: CT ABDOMEN AND PELVIS WITH CONTRAST TECHNIQUE: Multidetector CT imaging of the abdomen and pelvis was performed using the standard protocol following bolus administration of intravenous contrast. RADIATION DOSE REDUCTION: This exam was performed according to the departmental dose-optimization program which includes automated exposure control, adjustment of the mA and/or kV according to patient size and/or use of iterative reconstruction technique. CONTRAST:  41mL OMNIPAQUE IOHEXOL 300 MG/ML  SOLN COMPARISON:  04/27/2021. FINDINGS: Lower chest: The heart is enlarged and there is no pericardial effusion. Scattered coronary artery calcifications are noted. There is aneurysmal dilatation of the ascending aorta measuring 4.1 cm. There is bilateral lower lobe bronchiectasis with bronchial wall thickening. Consolidation is present in the left lung base and patchy airspace disease is noted at the right lung base. No no pleural effusion. Hepatobiliary: Multiple scattered cysts are present in the liver and unchanged from the prior exam. The gallbladder is distended measuring 5.3 cm and a few stones are identified. No biliary ductal dilatation. Pancreas: There is fatty atrophy of the pancreas. No pancreatic ductal dilatation or surrounding fat stranding. Spleen: Normal in size without focal abnormality. Adrenals/Urinary  Tract: There is a stable nodule in the right adrenal gland measuring 1.5 cm. The left adrenal gland is within normal limits. No renal calculus or obstructive uropathy bilaterally. The kidneys enhance symmetrically. Subcentimeter hypodensities are present in the kidneys bilaterally and too small to further characterize. A Foley catheter is present in the urinary bladder and the bladder is nondistended. Stomach/Bowel: The stomach is within normal limits. No bowel obstruction, free air or pneumatosis. A normal appendix is present in the right lower quadrant. The rectum distends below the level of the pubococcygeal line suggesting pelvic floor dysfunction. There is a large amount of stool in the rectum. A few scattered diverticula are present along the colon without evidence of diverticulitis. No focal bowel wall thickening. Vascular/Lymphatic: Atherosclerotic calcification of the aorta. There is redemonstration of a saccular infrarenal abdominal aortic aneurysm measuring 5.1 cm, not significantly changed from the prior exam. Aortobifemoral bypass grafts are noted. There is severe stenosis at the left femoral graft anastomosis. No abdominal or pelvic lymphadenopathy. Reproductive: The prostate gland is enlarged and contains multiple brachytherapy seeds and/or surgical clips. Other: A fat containing umbilical hernia is noted.  No free fluid. Musculoskeletal: Fixation hardware is noted at the left hip. Degenerative changes are present in the thoracolumbar spine. Sternotomy wires are noted over the midline. There is a skin defect over the posterior aspect of the sacrum and coccyx, unchanged from the prior exam. IMPRESSION: 1. No renal calculus or obstructive uropathy bilaterally. A Foley catheter is present in the urinary bladder in the bladder is decompressed. 2. Bronchiectasis with left lower lobe consolidation and patchy airspace disease at the lung bases bilaterally, concerning for pneumonia. 3. Distended gallbladder  with cholelithiasis. 4. Large amount of stool in the rectum and moderate amount of retained stool in the colon, possible constipation. 5. Remaining chronic changes as described above. Electronically Signed   By: Brett Fairy M.D.   On: 05/15/2021 00:46   MR SACRUM SI JOINTS WO CONTRAST  Result Date: 05/15/2021 CLINICAL DATA:  Sacral decubitus ulcer. EXAM: MRI SACRUM WITHOUT CONTRAST TECHNIQUE: Multiplanar, multisequence MR imaging of the  sacrum was performed. No intravenous contrast was administered. COMPARISON:  CT abdomen pelvis from same day. FINDINGS: Bones/Joint/Cartilage Abnormal marrow edema with corresponding decreased T1 marrow signal involving the lower sacrum and coccyx, with partial bony destruction of the coccyx, new since last November. No fracture or dislocation. Normal sacroiliac joints. No joint effusion. Muscles and Tendons Left greater than right pelvic muscle atrophy. Soft tissue Midline sacral decubitus ulcer extending to bone. Small fluid collections anterior and posterior to the lower sacrum measuring up to 1.6 cm (series 4, image 21). No soft tissue mass. Large stool ball in the rectum.  Foley catheter within the bladder. IMPRESSION: 1. Midline sacral decubitus ulcer extending to bone with underlying osteomyelitis of the lower sacrum and coccyx, new since November 2022. 2. Small fluid collections anterior and posterior to the lower sacrum measuring up to 1.6 cm, likely small abscesses. Electronically Signed   By: Titus Dubin M.D.   On: 05/15/2021 08:19   DG Chest Port 1 View  Result Date: 05/14/2021 CLINICAL DATA:  Possible sepsis EXAM: PORTABLE CHEST 1 VIEW COMPARISON:  04/27/2021 FINDINGS: Cardiac shadow is within normal limits. Postsurgical changes are noted. The lungs are well aerated bilaterally. Left basilar atelectasis is noted with mild blunting of the costophrenic angle new from the prior exam. IMPRESSION: Left basilar atelectasis and small effusion. Electronically  Signed   By: Inez Catalina M.D.   On: 05/14/2021 23:21      Scheduled Meds:  [START ON 05/20/2021] collagenase   Topical Daily   docusate sodium  100 mg Oral BID   enoxaparin (LOVENOX) injection  40 mg Subcutaneous Q24H   insulin aspart  0-9 Units Subcutaneous TID WC   senna  1 tablet Oral BID   sodium hypochlorite   Irrigation BID   Continuous Infusions:  ceFEPime (MAXIPIME) IV Stopped (05/15/21 0925)   lactated ringers 100 mL/hr at 05/15/21 1229   vancomycin Stopped (05/15/21 1229)     LOS: 0 days      Debbe Odea, MD [facility] Pager: [email or web address] 05/15/2021, 12:48 PM

## 2021-05-15 NOTE — H&P (Signed)
History and Physical    Chris Payne QQP:[id number] DOB: 02/28/1951 DOA: 05/14/2021  PCP: Benito Mccreedy, MD  Patient coming from: Home  I have personally briefly reviewed patient's old medical records in [place]  Chief Complaint: Generalized weakness  HPI: Chris Payne is a 71 y.o. male with medical history significant of CAD, HFrEF, COPD, prior stroke, AAA.  Pt has indwelling foley catheter chronically since urinary retention back in Dec.  Pt admitted earlier this month for CAUTI and cellulitis of sacral decubitus.  Urine grew out multiple sp.  Pt presents to ED with persistent weakness.  Per son: the patient has had problems with weakness and has been nonambulatory over the past month or so, he lives at home and there is plans to set up home health and a lift but this has not been done yet. Since he is nonambulatory and his wife cannot lift him he has not been getting up. He has been complaining of increased pain to his ulcers. Today he seemed out of it, less active, and confused compared to baseline. They have not noted any respiratory issues, fevers, vomiting, or diarrhea. They have not noted any recent head injuries.   Pt confused, complains of low back pain.   ED Course: CT AP just shows constipation.  Also cholelithiasis with distended gallbladder but no cholecystitis findings.  LFTs nl.  WBC 15k.   Past Medical History:  Diagnosis Date   AAA (abdominal aortic aneurysm)    s/p repair 6/11   CAD (coronary artery disease)    s/p CABG 2/12:  LIMA to LAD, SVG to diagonal-1, SVG to ramus intermedius,, SVG to AM (Dr. Roxan Hockey) ;  b.  Myoview 10/13:  inf infarct with very mild peri-infarct ischemia, EF 49%, inf HK; c  He has severe three-vessel native coronary artery disease. Catheterization March 2015 as above   CAP (community acquired pneumonia) 02/23/2014   Carotid stenosis    a.  dopplers 7/12: LICA 45-80%;  b. Carotid dopplers 3/14:  R 0-39%, L  60-79%, repeat 6 mos   CHF (congestive heart failure) (HCC)    COPD (chronic obstructive pulmonary disease) (HCC)    GERD (gastroesophageal reflux disease)    Headache(784.0)    Hyperlipidemia    Hypertension    Hypoxia 02/23/2014   Inguinal hernia    Myocardial infarction [facility])    PCI x3   PAD (peripheral artery disease) ([place])    ABIs 4/12:  R 0.88, L 0.92; R SFA 40%, L CFA 50%, L SFA 50-60%   Pneumonia    hx   Stroke [facility])    h/o pontine CVA   Thyroid nodule    incidental finding on carotid doppler 3/14 => thyroid U/S ordered    Past Surgical History:  Procedure Laterality Date   ABDOMINAL AORTIC ANEURYSM REPAIR  10-03-2009   CORONARY ARTERY BYPASS GRAFT  06/24/2010   LIMA to the LAD, SVG to first diagonal, SVG to ramus intermediate, SVG to acute marginal. EF 50%. 2/12   EXPLORATION POST OPERATIVE OPEN HEART     EXPLORATORY LAPAROTOMY  09/2009   ligation of lumbar arteries   EXTERNAL EAR SURGERY Left    laceration child   HIP SURGERY  40 years ago   left hip bone removal and pinning   INGUINAL HERNIA REPAIR Left 07/21/2012   Procedure: HERNIA REPAIR INGUINAL ADULT;  Surgeon: Merrie Roof, MD;  Location: [place];  Service: General;  Laterality: Left;   INSERTION OF MESH  Left 07/21/2012   Procedure: INSERTION OF MESH;  Surgeon: Merrie Roof, MD;  Location: [place];  Service: General;  Laterality: Left;   LEFT HEART CATHETERIZATION WITH CORONARY/GRAFT ANGIOGRAM  06/21/2012   Procedure: LEFT HEART CATHETERIZATION WITH Beatrix Fetters;  Surgeon: Minus Breeding, MD;  Location: [facility] CATH LAB;  Service: Cardiovascular;;   LEFT HEART CATHETERIZATION WITH CORONARY/GRAFT ANGIOGRAM N/A 07/04/2013   Procedure: LEFT HEART CATHETERIZATION WITH Beatrix Fetters;  Surgeon: Sinclair Grooms, MD;  Location[facility] C[facility] LAB;  Service: Cardiovascular;  Laterality: N/A;   NM MYOCAR PERF WALL MOTION  12/02/2010   inferoapical defect is fixed c/w prior infarction/scar, there is minimal  borderzone ischemia.     reports that he quit smoking about 11 years ago. His smoking use included cigarettes. He has a 30.00 pack-year smoking history. He has never used smokeless tobacco. He reports that he does not drink alcohol and does not use drugs.  No Known Allergies  Family History  Problem Relation Age of Onset   Alcohol abuse Father    Cancer Sister 5       unknown primary   Diabetes Mother    Hypertension Mother    Cancer Sister 66       kidney cancer    Prior to Admission medications   Medication Sig Start Date End Date Taking? Authorizing Provider  acetaminophen (TYLENOL) 500 MG tablet Take 1,000 mg by mouth every 6 (six) hours as needed for mild pain.    [provider]  albuterol (VENTOLIN HFA) 108 (90 Base) MCG/ACT inhaler Inhale 2 puffs into the lungs every 6 (six) hours as needed for wheezing or shortness of breath. 03/08/20   [place] Donnamarie Poag, DO  Amino Acids-Protein Hydrolys (FEEDING SUPPLEMENT, PRO-STAT SUGAR FREE 64,) LIQD Take 30 mLs by mouth 2 (two) times daily. Wound health    [provider]  aspirin 81 MG EC tablet Take 1 tablet (81 mg total) by mouth daily. 05/06/21   Eugenie Filler, MD  budesonide-formoterol [facility]) 160-4.5 MCG/ACT inhaler Inhale 2 puffs into the lungs 2 (two) times daily. 03/08/20   [place] Donnamarie Poag, DO  collagenase (SANTYL) ointment Apply topically daily. 05/04/21   Eugenie Filler, MD  Ensure (ENSURE) Take 237 mLs by mouth 2 (two) times daily between meals.    [provider]  furosemide (LASIX) 20 MG tablet Take 1 tablet (20 mg total) by mouth daily. 02/10/20   Deno Etienne, DO  gabapentin (NEURONTIN) 300 MG capsule Take 300 mg by mouth 2 (two) times daily.    [provider]  metFORMIN (GLUCOPHAGE) 500 MG tablet Take 500 mg by mouth daily with supper.    [provider]  metoprolol tartrate (LOPRESSOR) 25 MG tablet Take 1 tablet (25 mg total) by mouth 2 (two) times daily. 03/08/20  04/28/21  [place] Donnamarie Poag, DO  pantoprazole (PROTONIX) 40 MG tablet Take 40 mg by mouth daily. 02/19/21   [provider]  rosuvastatin (CRESTOR) 40 MG tablet Take 1 tablet (40 mg total) by mouth daily. 03/08/20 05/15/21  [place] Donnamarie Poag, DO  simethicone (MYLICON) 80 MG chewable tablet Chew 2 tablets (160 mg total) by mouth 4 (four) times daily as needed for flatulence. 05/03/21   Eugenie Filler, MD  tamsulosin (FLOMAX) 0.4 MG CAPS capsule Take 1 capsule (0.4 mg total) by mouth daily. 03/29/21   Corky Sox, MD    Physical Exam: Vitals:   05/14/21 2315 05/14/21 2335 05/15/21 0030 05/15/21 0130  BP: (!) 114/93 (!) 106/50 105/62 108/60  Pulse: 86 80 82 76  Resp: 20 16 17 19   Temp:      TempSrc:      SpO2: 96% 95% 93% 93%    Constitutional: Ill appearing Eyes: PERRL, lids and conjunctivae normal ENMT: Mucous membranes are moist. Posterior pharynx clear of any exudate or lesions.Normal dentition.  Neck: normal, supple, no masses, no thyromegaly Respiratory: clear to auscultation bilaterally, no wheezing, no crackles. Normal respiratory effort. No accessory muscle use.  Cardiovascular: Regular rate and rhythm, no murmurs / rubs / gallops. No extremity edema. 2+ pedal pulses. No carotid bruits.  Abdomen: Lower abdominal TTP Musculoskeletal: no clubbing / cyanosis. No joint deformity upper and lower extremities. Good ROM, no contractures. Normal muscle tone.  Skin: Sacral decubitus (see below), also bas bilateral heel decubitus.  Heel decubiti dont appear infected (in contrast to sacrum). Neurologic: MAE, grossly non-focal Psychiatric: Oriented to person and place, not time.   Labs on Admission: I have personally reviewed following labs and imaging studies  CBC: Recent Labs  Lab 05/08/21 1145 05/14/21 2213  WBC 12.5* 15.1*  NEUTROABS 10.1* 12.6*  HGB 12.9* 13.2  HCT 42.2 41.5  MCV 108.5* 105.6*  PLT 358 161   Basic Metabolic Panel: Recent Labs  Lab 05/08/21 1145  05/14/21 2213  NA 136 134*  K 4.8 3.4*  CL 101 98  CO2 28 25  GLUCOSE 152* 142*  BUN 24* 23  CREATININE 0.80 0.81  CALCIUM 8.9 8.8*   GFR: Estimated Creatinine Clearance: 78.2 mL/min (by C-G formula based on SCr of 0.81 mg/dL). Liver Function Tests: Recent Labs  Lab 05/14/21 2213  AST 27  ALT 26  ALKPHOS 103  BILITOT 1.1  PROT 6.7  ALBUMIN 2.1*   No results for input(s): LIPASE, AMYLASE in the last 168 hours. No results for input(s): AMMONIA in the last 168 hours. Coagulation Profile: No results for input(s): INR, PROTIME in the last 168 hours. Cardiac Enzymes: No results for input(s): CKTOTAL, CKMB, CKMBINDEX, TROPONINI in the last 168 hours. BNP (last 3 results) No results for input(s): PROBNP in the last 8760 hours. HbA1C: No results for input(s): HGBA1C in the last 72 hours. CBG: No results for input(s): GLUCAP in the last 168 hours. Lipid Profile: No results for input(s): CHOL, HDL, LDLCALC, TRIG, CHOLHDL, LDLDIRECT in the last 72 hours. Thyroid Function Tests: No results for input(s): TSH, T4TOTAL, FREET4, T3FREE, THYROIDAB in the last 72 hours. Anemia Panel: No results for input(s): VITAMINB12, FOLATE, FERRITIN, TIBC, IRON, RETICCTPCT in the last 72 hours. Urine analysis:    Component Value Date/Time   COLORURINE ORANGE (A) 05/14/2021 2159   APPEARANCEUR TURBID (A) 05/14/2021 2159   LABSPEC >1.030 (H) 05/14/2021 2159   PHURINE 6.0 05/14/2021 2159   GLUCOSEU NEGATIVE 05/14/2021 2159   HGBUR LARGE (A) 05/14/2021 2159   BILIRUBINUR SMALL (A) 05/14/2021 2159   BILIRUBINUR small 08/23/2013 [place] 05/14/2021 2159   PROTEINUR 100 (A) 05/14/2021 2159   UROBILINOGEN 1.0 02/08/2015 1902   NITRITE NEGATIVE 05/14/2021 2159   LEUKOCYTESUR MODERATE (A) 05/14/2021 2159    Radiological Exams on Admission: CT Abdomen Pelvis W Contrast  Result Date: 05/15/2021 CLINICAL DATA:  Abdominal pain, dark urine.  Difficulty walking. EXAM: CT ABDOMEN AND  PELVIS WITH CONTRAST TECHNIQUE: Multidetector CT imaging of the abdomen and pelvis was performed using the standard protocol following bolus administration of intravenous contrast. RADIATION DOSE REDUCTION: This exam was performed according to the departmental  dose-optimization program which includes automated exposure control, adjustment of the mA and/or kV according to patient size and/or use of iterative reconstruction technique. CONTRAST:  51mL OMNIPAQUE IOHEXOL 300 MG/ML  SOLN COMPARISON:  04/27/2021. FINDINGS: Lower chest: The heart is enlarged and there is no pericardial effusion. Scattered coronary artery calcifications are noted. There is aneurysmal dilatation of the ascending aorta measuring 4.1 cm. There is bilateral lower lobe bronchiectasis with bronchial wall thickening. Consolidation is present in the left lung base and patchy airspace disease is noted at the right lung base. No no pleural effusion. Hepatobiliary: Multiple scattered cysts are present in the liver and unchanged from the prior exam. The gallbladder is distended measuring 5.3 cm and a few stones are identified. No biliary ductal dilatation. Pancreas: There is fatty atrophy of the pancreas. No pancreatic ductal dilatation or surrounding fat stranding. Spleen: Normal in size without focal abnormality. Adrenals/Urinary Tract: There is a stable nodule in the right adrenal gland measuring 1.5 cm. The left adrenal gland is within normal limits. No renal calculus or obstructive uropathy bilaterally. The kidneys enhance symmetrically. Subcentimeter hypodensities are present in the kidneys bilaterally and too small to further characterize. A Foley catheter is present in the urinary bladder and the bladder is nondistended. Stomach/Bowel: The stomach is within normal limits. No bowel obstruction, free air or pneumatosis. A normal appendix is present in the right lower quadrant. The rectum distends below the level of the pubococcygeal line  suggesting pelvic floor dysfunction. There is a large amount of stool in the rectum. A few scattered diverticula are present along the colon without evidence of diverticulitis. No focal bowel wall thickening. Vascular/Lymphatic: Atherosclerotic calcification of the aorta. There is redemonstration of a saccular infrarenal abdominal aortic aneurysm measuring 5.1 cm, not significantly changed from the prior exam. Aortobifemoral bypass grafts are noted. There is severe stenosis at the left femoral graft anastomosis. No abdominal or pelvic lymphadenopathy. Reproductive: The prostate gland is enlarged and contains multiple brachytherapy seeds and/or surgical clips. Other: A fat containing umbilical hernia is noted.  No free fluid. Musculoskeletal: Fixation hardware is noted at the left hip. Degenerative changes are present in the thoracolumbar spine. Sternotomy wires are noted over the midline. There is a skin defect over the posterior aspect of the sacrum and coccyx, unchanged from the prior exam. IMPRESSION: 1. No renal calculus or obstructive uropathy bilaterally. A Foley catheter is present in the urinary bladder in the bladder is decompressed. 2. Bronchiectasis with left lower lobe consolidation and patchy airspace disease at the lung bases bilaterally, concerning for pneumonia. 3. Distended gallbladder with cholelithiasis. 4. Large amount of stool in the rectum and moderate amount of retained stool in the colon, possible constipation. 5. Remaining chronic changes as described above. Electronically Signed   By: Brett Fairy M.D.   On: 05/15/2021 00:46   DG Chest Port 1 View  Result Date: 05/14/2021 CLINICAL DATA:  Possible sepsis EXAM: PORTABLE CHEST 1 VIEW COMPARISON:  04/27/2021 FINDINGS: Cardiac shadow is within normal limits. Postsurgical changes are noted. The lungs are well aerated bilaterally. Left basilar atelectasis is noted with mild blunting of the costophrenic angle new from the prior exam.  IMPRESSION: Left basilar atelectasis and small effusion. Electronically Signed   By: Inez Catalina M.D.   On: 05/14/2021 23:21    EKG: Independently reviewed.  Assessment/Plan Principal Problem:   Sepsis ([place]) Active Problems:   Hypertensive heart disease   Unstageable pressure ulcer of sacral region (HCC)   HFrEF (heart failure with  reduced ejection fraction) (HCC)   Pressure injury of heel, unstageable (HCC)   Constipation   UTI (urinary tract infection)   Cellulitis   Chronic indwelling Foley catheter   Acute metabolic encephalopathy    Sepsis - Possible sources = UTI and sacral decubitus.  Am most concerned about the sacral decubitus which looks worse than earlier this month based on photos. Sepsis pathway Cont empiric cefepime + vanc BCx UCx pending Getting MRI of Sacrum and L spine to look for osteomyelitis evidence Wound care consult Likely needs general surgery consult for debridement of sacral ulcer in AM IVF: 500cc bolus and LR at 100 cc/hr for now Heel ulcers - Wound care consult, dont appear grossly infected (in contrast to sacrum) Constipation - Fleet enema x1 now Starting scheduled senna + docusate daily Acute metabolic encephalopathy - Likely secondary to #1 above DM2 - Hold metformin Sensitive SSI AC/HS Debility - PT/OT eval and treat SW for placement (probably needs SNF vs CIR at this point).  DVT prophylaxis: Lovenox Code Status: Full Family Communication: No family in room Disposition Plan: Likely needs SNF vs CIR after admit Consults called: None Admission status: Admit to inpatient  Severity of Illness: The appropriate patient status for this patient is INPATIENT. Inpatient status is judged to be reasonable and necessary in order to provide the required intensity of service to ensure the patient's safety. The patient's presenting symptoms, physical exam findings, and initial radiographic and laboratory data in the context of their chronic  comorbidities is felt to place them at high risk for further clinical deterioration. Furthermore, it is not anticipated that the patient will be medically stable for discharge from the hospital within 2 midnights of admission.   * I certify that at the point of admission it is my clinical judgment that the patient will require inpatient hospital care spanning beyond 2 midnights from the point of admission due to high intensity of service, high risk for further deterioration and high frequency of surveillance required.*   Amulya Quintin M. DO [facility]  How to contact the [facility] Attending or Consulting provider [place] or covering provider during after hours [place], for this patient?  Check the care team in [facility] and look for a) attending/consulting [facility] provider listed and b) the [facility] team listed Log into [email or web address]  Amion Physician Scheduling and messaging for groups and whole hospitals  On call and physician scheduling software for group practices, residents, hospitalists and other medical providers for call, clinic, rotation and shift schedules. OnCall Enterprise is a hospital-wide system for scheduling doctors and paging doctors on call. EasyPlot is for scientific plotting and data analysis.  [email or web address]  and use [place] universal password to access. If you do not have the password, please contact the hospital operator.  Locate the [facility] provider you are looking for under [facility] and page to a number that you can be directly reached. If you still have difficulty reaching the provider, please page the [facility] (Director on Call) for the Hospitalists listed on amion for assistance.  05/15/2021, 2:20 AM

## 2021-05-16 LAB — URINE CULTURE: Culture: >=100000 — AB

## 2021-05-16 LAB — BASIC METABOLIC PANEL
Anion gap: 5 (ref 5–15)
BUN: 14 mg/dL (ref 8–23)
CO2: 26 mmol/L (ref 22–32)
Calcium: 8.1 mg/dL — ABNORMAL LOW (ref 8.9–10.3)
Chloride: 102 mmol/L (ref 98–111)
Creatinine, Ser: 0.63 mg/dL (ref 0.61–1.24)
GFR, Estimated: >60 mL/min (ref >60)
Glucose, Bld: 86 mg/dL (ref 70–99)
Potassium: 2.7 mmol/L — CL (ref 3.5–5.1)
Sodium: 133 mmol/L — ABNORMAL LOW (ref 135–145)

## 2021-05-16 LAB — GLUCOSE, CAPILLARY
Glucose-Capillary: 92 mg/dL (ref 70–99)
Glucose-Capillary: 103 mg/dL — ABNORMAL HIGH (ref 70–99)
Glucose-Capillary: 84 mg/dL (ref 70–99)
Glucose-Capillary: 73 mg/dL (ref 70–99)
Glucose-Capillary: 118 mg/dL — ABNORMAL HIGH (ref 70–99)

## 2021-05-16 LAB — MAGNESIUM: Magnesium: 1.4 mg/dL — ABNORMAL LOW (ref 1.7–2.4)

## 2021-05-16 MED ORDER — MORPHINE SULFATE (PF) 2 MG/ML IV SOLN
2.0000 mg | Freq: Four times a day (QID) | INTRAVENOUS | Status: DC | PRN
Start: 2021-05-16 — End: 2021-05-20
  Administered 2021-05-16 – 2021-05-17 (×2): 2 mg via INTRAVENOUS
  Filled 2021-05-16 (×2): qty 1

## 2021-05-16 MED ORDER — MAGNESIUM SULFATE 4 GM/100ML IV SOLN
4.0000 g | Freq: Once | INTRAVENOUS | Status: AC
  Administered 2021-05-16: 4 g via INTRAVENOUS
  Filled 2021-05-16: qty 100

## 2021-05-16 MED ORDER — ZINC SULFATE 220 (50 ZN) MG PO CAPS
220.0000 mg | ORAL_CAPSULE | Freq: Every day | ORAL | Status: DC
  Administered 2021-05-17: 220 mg via ORAL
  Filled 2021-05-16 (×2): qty 1

## 2021-05-16 MED ORDER — ASCORBIC ACID 500 MG PO TABS
500.0000 mg | ORAL_TABLET | Freq: Two times a day (BID) | ORAL | Status: DC
  Administered 2021-05-16 – 2021-05-17 (×2): 500 mg via ORAL
  Filled 2021-05-16 (×3): qty 1

## 2021-05-16 MED ORDER — POTASSIUM CHLORIDE CRYS ER 20 MEQ PO TBCR
40.0000 meq | EXTENDED_RELEASE_TABLET | ORAL | Status: DC
  Filled 2021-05-16: qty 2

## 2021-05-16 MED ORDER — ENSURE ENLIVE PO LIQD
237.0000 mL | Freq: Three times a day (TID) | ORAL | Status: DC
  Administered 2021-05-17 – 2021-05-19 (×4): 237 mL via ORAL

## 2021-05-16 MED ORDER — POTASSIUM CHLORIDE 10 MEQ/100ML IV SOLN
10.0000 meq | INTRAVENOUS | Status: AC
  Administered 2021-05-16 (×6): 10 meq via INTRAVENOUS
  Filled 2021-05-16 (×2): qty 100

## 2021-05-16 MED ORDER — ADULT MULTIVITAMIN W/MINERALS CH
1.0000 | ORAL_TABLET | Freq: Every day | ORAL | Status: DC
  Administered 2021-05-17: 1 via ORAL
  Filled 2021-05-16 (×2): qty 1

## 2021-05-16 NOTE — Progress Notes (Addendum)
Initial Nutrition Assessment  DOCUMENTATION CODES:   Not applicable  INTERVENTION:   -MVI with minerals daily -500 mg vitamin C BID -220 mg zinc sulfate daily x 14 days -Ensure Enlive po TID, each supplement provides 350 kcal and 20 grams of protein  -Feeding assistance with meals  NUTRITION DIAGNOSIS:   Increased nutrient needs related to wound healing as evidenced by estimated needs.  GOAL:   Patient will meet greater than or equal to 90% of their needs  MONITOR:   PO intake, Supplement acceptance, Labs, Weight trends, Skin, I & O's  REASON FOR ASSESSMENT:   Consult Assessment of nutrition requirement/status, Wound healing  ASSESSMENT:   Chris Payne is a 71 year old male who lives at home has BPH with urinary retention and a chronic Foley, stage IV sacral decubitus ulcer, PAD, CVA, CAD status post CABG, HFrEF, hypertension.  He is bedbound.  Pt admitted with sepsis secondary to sacral ulcer.   Reviewed I/O's: -500 ml x 24 hours  UOP: 750 ml x 24 hours  Pt unavailable at time of visit. Attempted to speak with pt via call to hospital room phone, however, unable to reach. RD unable to obtain further nutrition-related history or complete nutrition-focused physical exam at this time.    Pt is currently on a regular diet. No meal completion data available at this time. Per nursing notes, RN attempted to feed pt, however, pt refused, stating he didn't have an appetite.   Reviewed wt hx; pt has experienced a 5.3 % wt loss over the past 3 months, which is not significant for time frame.   Pt is at high risk for malnutrition, however, unable to identify at this time. Pt would benefit from addition of oral nutrition supplements.   Palliative care consult pending; per chart review, pt son is leaning towards comfort care, but would like to discuss with other family members prior to committing to this.  Medications reviewed and include colace, senokot, and lactated ringers  infusion @ 100 ml/hr.   Lab Results  Component Value Date   HGBA1C 8.2 (H) 03/26/2021   PTA DM medications are 500 mg metformin BID.   Labs reviewed: Na: 133, K: 2.7, CBGS: 92-118 (inpatient orders for glycemic control are none).     Diet Order:   Diet Order             Diet regular Room service appropriate? Yes; Fluid consistency: Thin  Diet effective now                   EDUCATION NEEDS:   Education needs have been addressed  Skin:  Skin Assessment: Skin Integrity Issues: Skin Integrity Issues:: Unstageable, Other (Comment), DTI, Stage II DTI: bilateral heels Stage II: lt heel Unstageable: sacrum Other: skin tears to lt upper arm, rt forearm, LLE, lt knee, and RLE; excorciated skin on rt upper thigh  Last BM:  05/15/21  Height:   Ht Readings from Last 1 Encounters:  05/08/21 5\' 7"  (1.702 m)    Weight:   Wt Readings from Last 1 Encounters:  05/08/21 73 kg    Ideal Body Weight:  67.3 kg  BMI:  There is no height or weight on file to calculate BMI.  Estimated Nutritional Needs:   Kcal:  2150-2350  Protein:  120-135 grams  Fluid:  > 2 L    Loistine Chance, RD, LDN, [place] Registered Dietitian II Certified Diabetes Care and Education Specialist Please refer to [facility] for RD and/or RD on-call/weekend/after hours  pager

## 2021-05-16 NOTE — Progress Notes (Addendum)
° °  This pt is familiar to [place] at [place]. He had some [place] services in the home with [facility] who reached out to [place] on 05-07-21 to evaluate the pt for hospice services at home. We were able to get this arranged with family for 05/14/21 and did go to home with intent to enroll for hospice services. However with speaking to son and wife at visit- The pt at this time was alert and oriented but did not participate in the visit referred all questions to be answered by his son. The wife was noted to have some mild dementia and also did not participate in the conversation. We did proceed with hospice information about what we could assist with and type of care we could provide in the home. The son wanted to discuss with the rest of the family before committing to full comfort.   05/15/21 430pm The son Toula Moos called our office letting us know his dad was back at [facility] and that he was now interested in hospice services at home.   We will be happy to proceed with hospice services and referral workup at home if this is indeed what the family wants. Please reach out to [place] with official referral after PC see's the pt and this is truly what they desire.   [name] RN [phone]

## 2021-05-16 NOTE — Progress Notes (Signed)
PT Cancellation Note  Patient Details Name: Chris Payne MRN: [id number] DOB: 09-Sep-1950   Cancelled Treatment:    Reason Eval/Treat Not Completed: Fatigue/lethargy limiting ability to participate - per RN, pt is resting and refusing cares at present. PT to check back when more appropriate.  Stacie Glaze, PT DPT Acute Rehabilitation Services Pager [phone]  Office [phone]    Louis Matte 05/16/2021, 3:32 PM

## 2021-05-16 NOTE — Consult Note (Signed)
° °  [facility] Inpatient Consult   05/16/2021  ERIK BURKETT 10-13-1950 [id number]  [facility] [ACO] Patient: [employer]  Primary Care Provider:  Benito Mccreedy, MD, [facility]   Patient screened for hospitalization with noted high risk score for unplanned readmission risk and  to assess for potential [facility] service needs for post hospital transition.  Review of patient's medical record reveals patient is being recommended for ongoing palliative/hospice for post hospital.   Plan:  Continue to follow progress and disposition to assess for post hospital care management needs.    For questions contact:   Natividad Brood, RN BSN [facility] Liaison  [phone] business mobile phone Toll free office [phone]  Fax number: [phone] [email or web address]

## 2021-05-16 NOTE — Progress Notes (Signed)
Received critical K+ of 2.7 from lab at 0859. Paged Dr. Wynelle Cleveland to notify her of the result. Received orders for oral K+ to supplement patient.    Chris Payne

## 2021-05-16 NOTE — Progress Notes (Signed)
Physical Therapy Wound Treatment  Pt asked therapist to discontinue hydrotherapy,  WOC RN has order discontinuation.  Will signoff.   Patient Details  Name: Chris Payne MRN: [id number] Date of Birth: 1951-04-14  Today's Date: 05/16/2021 Time: 6363-3019 Time Calculation (min): 27 min  Subjective  Subjective Assessment Subjective: Please stop, I don't want this Patient and Family Stated Goals: pt asks to stop this intervention Date of Onset:  (unsure) Prior Treatments:  (unsure)  Pain Score:  6-8/10  after premedication  Wound Assessment     Wound Image   05/16/21 1342     Pressure Injury 04/29/21 Sacrum Medial Unstageable - Full thickness tissue loss in which the base of the injury is covered by slough (yellow, tan, gray, green or brown) and/or eschar (tan, brown or black) in the wound bed. PT only, large painfu wound (Active)  Dressing Type ABD;Barrier Film (skin prep);Foam - Lift dressing to assess site every shift;Gauze (Comment);Moist to dry;Normal saline moist dressing 05/16/21 1342  Dressing Clean;Dry;Intact 05/16/21 1342  Dressing Change Frequency Daily 05/16/21 1342  State of Healing Non-healing 05/16/21 1342  Site / Wound Assessment Black;Brown;Yellow 05/16/21 1342  % Wound base Red or Granulating 5% 05/16/21 1342  % Wound base Yellow/Fibrinous Exudate 25% 05/16/21 1342  % Wound base Black/Eschar 70% 05/16/21 1342  % Wound base Other/Granulation Tissue (Comment) 0% 05/16/21 1342  Peri-wound Assessment Erythema (blanchable) 05/16/21 1342  Wound Length (cm) 9 cm 05/16/21 1342  Wound Width (cm) 11 cm 05/16/21 1342  Wound Depth (cm) 0.5 cm 05/16/21 1342  Wound Surface Area (cm^2) 99 cm^2 05/16/21 1342  Wound Volume (cm^3) 49.5 cm^3 05/16/21 1342  Undermining (cm) unable to determine due to pain and pt's wishes to stop on hydro. 05/16/21 1342  Margins Unattached edges (unapproximated) 05/02/21 1330  Drainage Amount Copious 05/16/21 1342  Drainage Description  Purulent;Odor 05/16/21 1342  Treatment Cleansed;Debridement (Selective);Hydrotherapy (Pulse lavage);Packing (Saline gauze) 05/16/21 1342      Hydrotherapy Pulsed lavage therapy - wound location: sacral Pulsed Lavage with Suction (psi): 4 psi Pulsed Lavage with Suction - Normal Saline Used: 1000 mL Pulsed Lavage Tip: Tip with splash shield Selective Debridement Selective Debridement - Location: sacral Selective Debridement - Tools Used: Forceps, Scalpel (mostly) Selective Debridement - Tissue Removed: eschar, slough    Wound Assessment and Plan  Wound Therapy - Assess/Plan/Recommendations Wound Therapy - Clinical Statement: Pt's wound very sensitive even with pain meds.  He might benefit from hydrotherapy for PLS and debridement, but is choosing that we discontinue at this time. Wound Therapy - Functional Problem List: total care/bedbound Factors Delaying/Impairing Wound Healing: Altered sensation, Multiple medical problems Hydrotherapy Plan: Other (comment) (discontinue due to patients wishes) Wound Therapy - Current Recommendations: Case manager/social work Wound Therapy - Follow Up Recommendations: dressing changes by RN  Wound Therapy Goals- Improve the function of patient's integumentary system by progressing the wound(s) through the phases of wound healing (inflammation - proliferation - remodeling) by:    Goals will be updated until maximal potential achieved or discharge criteria met.  Discharge criteria: when goals achieved, discharge from hospital, MD decision/surgical intervention, no progress towards goals, refusal/missing three consecutive treatments without notification or medical reason.  GP     Charges PT Wound Care Charges $Wound Debridement up to 20 cm: < or equal to 20 cm $ Wound Debridement each add'l 20 sqcm: 3 $PT PLS Gun and Tip: 1 Supply $PT Hydrotherapy Visit: 1 Visit       Eliseo Gum Kamdyn Colborn 05/16/2021, 1:58 PM

## 2021-05-16 NOTE — Progress Notes (Signed)
Patient refusing oral medications at this time. Will attempt later. MD notified.   Chris Payne E

## 2021-05-16 NOTE — Plan of Care (Signed)

## 2021-05-16 NOTE — Progress Notes (Signed)
PROGRESS NOTE    CHRISTION LEONHARD   VOJ:[id number]  DOB: [birth year]  DOA: 05/14/2021 PCP: Benito Mccreedy, MD   Brief Narrative:  Chris Payne is a 71 year old male who lives at home has BPH with urinary retention and a chronic Foley, stage IV sacral decubitus ulcer, PAD, CVA, CAD status post CABG, HFrEF, hypertension.  He is bedbound.  He was in the hospital from 04/27/2021 through 05/03/2021 with sepsis thought to be secondary to a UTI.  He was discharged home with 4 days of Augmentin.  He returns for pain in the Foley catheter.  He also states to me that he has pain in his bottom.    In ED: Foley was replaced over a liter of urine was obtained. He was noted to have a temperature of 100.6 rectally, WBC count 15.1 and heart rates greater than 100  He was given Vanco and cefepime and an MRI of the sacrum was ordered to look for osteomyelitis.  Subjective: He was sleeping when I entered the room.  Easily awakens.  Has no complaints for me.    Assessment & Plan:   Principal Problem: Severe sepsis ([place]) likely secondary to infection of stage IV sacral ulcer - leukocytosis, tachycardia and a fever - As he has a chronic Foley, UA is likely going to be unreliable - Could be that his decubitus ulcer that is infected and MRI does show osteomyelitis -Continue Vanco and cefepime -He lives at home with stepson and wife, is unable to be turned and this is likely not going to heal -I have spoken with his son who states hospice came in to speak with them but he wanted to speak with others before he made a final decision -Today the patient is declining hydrotherapy and has declined a.m. oral medications -On 1/19, I requested a palliative care consult to speak with the son and help him to come to a decision- he states he is the main decision maker and not the patient's wife as she gets confused- seems to me he is a candidate for hospice home -As needed morphine ordered for pain   Active  Problems:  Confusion - Possibly has acute metabolic encephalopathy-follow for improvement  Chronic Foley with obstruction -Foley changed in ED with a good amount of urine output subsequently -He is on Flomax at home and I will continue this -Urine culture shows greater than 100,000 colonies of yeast-we will hold off on treating this  Hypokalemia and hypomagnesemia - Replace and follow    Hypertension  -His BP is low and therefore we are holding metoprolol and Lasix -His last echo in 11/22 showed a normal EF and grade 1 diastolic dysfunction   Pressure Injury 03/27/21 Sacrum Unstageable - Full thickness tissue loss in which the base of the injury is covered by slough (yellow, tan, gray, green or brown) and/or eschar (tan, brown or black) in the wound bed. necrotic tissue and slough (Active)  03/27/21 1345  Location: Sacrum  Location Orientation:   Staging: Unstageable - Full thickness tissue loss in which the base of the injury is covered by slough (yellow, tan, gray, green or brown) and/or eschar (tan, brown or black) in the wound bed.  Wound Description (Comments): necrotic tissue and slough  Present on Admission: Yes     Pressure Injury 04/28/21 Foot Left;Posterior Stage 3 -  Full thickness tissue loss. Subcutaneous fat may be visible but bone, tendon or muscle are NOT exposed. prevalon boots and foam heel protectors intact (Active)  04/28/21 2000  Location: Foot (lateral heel)  Location Orientation: Left;Posterior  Staging: Stage 3 -  Full thickness tissue loss. Subcutaneous fat may be visible but bone, tendon or muscle are NOT exposed.  Wound Description (Comments): prevalon boots and foam heel protectors intact  Present on Admission: Yes     Pressure Injury 04/28/21 Heel Left Deep Tissue Pressure Injury - Purple or maroon localized area of discolored intact skin or blood-filled blister due to damage of underlying soft tissue from pressure and/or shear. blood filled blister left  heel (Active)  04/28/21 2000  Location: Heel  Location Orientation: Left  Staging: Deep Tissue Pressure Injury - Purple or maroon localized area of discolored intact skin or blood-filled blister due to damage of underlying soft tissue from pressure and/or shear.  Wound Description (Comments): blood filled blister left heel  Present on Admission: Yes     Pressure Injury 04/29/21 Sacrum Medial Unstageable - Full thickness tissue loss in which the base of the injury is covered by slough (yellow, tan, gray, green or brown) and/or eschar (tan, brown or black) in the wound bed. PT only, large painfu wound (Active)  04/29/21 1130  Location: Sacrum  Location Orientation: Medial  Staging: Unstageable - Full thickness tissue loss in which the base of the injury is covered by slough (yellow, tan, gray, green or brown) and/or eschar (tan, brown or black) in the wound bed.  Wound Description (Comments): PT only, large painfu wound  Present on Admission: Yes      Constipation -Given enema in the ED    Diabetes mellitus, with hyperglycemia - He takes metformin at home - Continue sliding scale insulin in the hospital Hemoglobin A1C    Component Value Date/Time   HGBA1C 8.2 (H) 03/26/2021 2028    DVT prophylaxis: enoxaparin (LOVENOX) injection 40 mg Start: 05/15/21 0800   Code Status: Full code Family Communication: [name] step son- [phone] Level of Care: Level of care: Telemetry Medical Disposition Plan:  Status is: Inpatient  Remains inpatient appropriate because: cont IV abx while waiting on palliative care discussions   Consultants:  Palliative care Procedures:  none Antimicrobials:  Anti-infectives (From admission, onward)    Start     Dose/Rate Route Frequency Ordered Stop   05/15/21 1200  vancomycin (VANCOREADY) IVPB 750 mg/150 mL        750 mg 150 mL/hr over 60 Minutes Intravenous Every 12 hours 05/14/21 2319     05/15/21 0600  ceFEPIme (MAXIPIME) 2 g in sodium  chloride 0.9 % 100 mL IVPB        2 g 200 mL/hr over 30 Minutes Intravenous Every 8 hours 05/14/21 2319     05/14/21 2315  ceFEPIme (MAXIPIME) 2 g in sodium chloride 0.9 % 100 mL IVPB        2 g 200 mL/hr over 30 Minutes Intravenous  Once 05/14/21 2310 05/15/21 0129   05/14/21 2315  metroNIDAZOLE (FLAGYL) IVPB 500 mg        500 mg 100 mL/hr over 60 Minutes Intravenous  Once 05/14/21 2310 05/15/21 0232   05/14/21 2315  vancomycin (VANCOCIN) IVPB 1000 mg/200 mL premix  Status:  Discontinued        1,000 mg 200 mL/hr over 60 Minutes Intravenous  Once 05/14/21 2310 05/14/21 2312   05/14/21 2315  vancomycin (VANCOREADY) IVPB 1500 mg/300 mL        1,500 mg 150 mL/hr over 120 Minutes Intravenous  Once 05/14/21 2312 05/15/21 0434  Objective: Vitals:   05/15/21 1315 05/15/21 1345 05/15/21 2024 05/16/21 0804  BP: 101/84 100/64 (!) 129/58 122/61  Pulse: (!) 105 (!) 105 90 71  Resp: 17 17  16   Temp:   98.3 F (36.8 C) 98.9 F (37.2 C)  TempSrc:   Oral   SpO2: 97% 98% 97% 100%    Intake/Output Summary (Last 24 hours) at 05/16/2021 1335 Last data filed at 05/16/2021 0947 Gross per 24 hour  Intake --  Output 750 ml  Net -750 ml    There were no vitals filed for this visit.  Examination: General exam: Appears comfortable  HEENT: PERRLA, oral mucosa moist, no sclera icterus or thrush Respiratory system: Clear to auscultation. Respiratory effort normal. Cardiovascular system: S1 & S2 heard, RRR.   Gastrointestinal system: Abdomen soft, non-tender, nondistended. Normal bowel sounds. Central nervous system: Alert and oriented to person but not place or time. No focal neurological deficits. Extremities: No cyanosis, clubbing - 2 + pedal  edema Skin: skin tears and abrasion on body, numerous bruises   Psychiatry:  Mood & affect appropriate.     Data Reviewed: I have personally reviewed following labs and imaging studies  CBC: Recent Labs  Lab 05/14/21 2213 05/15/21 0432   WBC 15.1* 11.9*  NEUTROABS 12.6*  --   HGB 13.2 11.0*  HCT 41.5 35.8*  MCV 105.6* 106.2*  PLT 288 096    Basic Metabolic Panel: Recent Labs  Lab 05/14/21 2213 05/15/21 0432 05/16/21 0751  NA 134* 135 133*  K 3.4* 3.1* 2.7*  CL 98 101 102  CO2 25 26 26   GLUCOSE 142* 110* 86  BUN 23 22 14   CREATININE 0.81 0.66 0.63  CALCIUM 8.8* 8.2* 8.1*  MG  --   --  1.4*    GFR: Estimated Creatinine Clearance: 78 mL/min (by C-G formula based on SCr of 0.63 mg/dL). Liver Function Tests: Recent Labs  Lab 05/14/21 2213 05/15/21 0432  AST 27 20  ALT 26 21  ALKPHOS 103 86  BILITOT 1.1 0.6  PROT 6.7 5.6*  ALBUMIN 2.1* 1.7*    No results for input(s): LIPASE, AMYLASE in the last 168 hours. No results for input(s): AMMONIA in the last 168 hours. Coagulation Profile: Recent Labs  Lab 05/15/21 0432  INR 1.3*    Cardiac Enzymes: No results for input(s): CKTOTAL, CKMB, CKMBINDEX, TROPONINI in the last 168 hours. BNP (last 3 results) No results for input(s): PROBNP in the last 8760 hours. HbA1C: No results for input(s): HGBA1C in the last 72 hours. CBG: Recent Labs  Lab 05/15/21 1119 05/15/21 1550 05/15/21 2055 05/16/21 0803 05/16/21 1150  GLUCAP 95 92 118* 103* 84    Lipid Profile: No results for input(s): CHOL, HDL, LDLCALC, TRIG, CHOLHDL, LDLDIRECT in the last 72 hours. Thyroid Function Tests: No results for input(s): TSH, T4TOTAL, FREET4, T3FREE, THYROIDAB in the last 72 hours. Anemia Panel: No results for input(s): VITAMINB12, FOLATE, FERRITIN, TIBC, IRON, RETICCTPCT in the last 72 hours. Urine analysis:    Component Value Date/Time   COLORURINE ORANGE (A) 05/14/2021 2159   APPEARANCEUR TURBID (A) 05/14/2021 2159   LABSPEC >1.030 (H) 05/14/2021 2159   PHURINE 6.0 05/14/2021 2159   GLUCOSEU NEGATIVE 05/14/2021 2159   HGBUR LARGE (A) 05/14/2021 2159   BILIRUBINUR SMALL (A) 05/14/2021 2159   BILIRUBINUR small 08/23/2013 [facility] 05/14/2021  2159   PROTEINUR 100 (A) 05/14/2021 2159   UROBILINOGEN 1.0 02/08/2015 1902   NITRITE NEGATIVE 05/14/2021 2159  LEUKOCYTESUR MODERATE (A) 05/14/2021 2159   Sepsis Labs: @LABRCNTIP (procalcitonin:4,lacticidven:4) ) Recent Results (from the past 240 hour(s))  Urine Culture     Status: Abnormal   Collection Time: 05/08/21 11:31 AM   Specimen: Urine, Catheterized  Result Value Ref Range Status   Specimen Description   Final    URINE, CATHETERIZED Performed at [facility] [address], [place], [place] [address]    Special Requests   Final    NONE Performed at [facility], [place] [address], [place], [place] [address]    Culture >=100,000 COLONIES/mL YEAST (A)  Final   Report Status 05/10/2021 FINAL  Final  Resp Panel by RT-PCR (Flu A&B, Covid) Nasopharyngeal Swab     Status: None   Collection Time: 05/14/21 11:10 PM   Specimen: Nasopharyngeal Swab; Nasopharyngeal(NP) swabs in vial transport medium  Result Value Ref Range Status   SARS Coronavirus 2 by RT PCR NEGATIVE NEGATIVE Final    Comment: (NOTE) SARS-CoV-2 target nucleic acids are NOT DETECTED.  The SARS-CoV-2 RNA is generally detectable in upper respiratory specimens during the acute phase of infection. The lowest concentration of SARS-CoV-2 viral copies this assay can detect is 138 copies/mL. A negative result does not preclude SARS-Cov-2 infection and should not be used as the sole basis for treatment or other patient management decisions. A negative result may occur with  improper specimen collection/handling, submission of specimen other than nasopharyngeal swab, presence of viral mutation(s) within the areas targeted by this assay, and inadequate number of viral copies(<138 copies/mL). A negative result must be combined with clinical observations, patient history, and epidemiological information. The expected result is Negative.  Fact Sheet for Patients:   EntrepreneurPulse.com.au  Fact Sheet for Healthcare Providers:  IncredibleEmployment.be  This test is no t yet approved or cleared by the [place] FDA and  has been authorized for detection and/or diagnosis of SARS-CoV-2 by FDA under an Emergency Use Authorization (EUA). This EUA will remain  in effect (meaning this test can be used) for the duration of the COVID-19 declaration under Section 564(b)(1) of the Act, 21 U.S.C.section 360bbb-3(b)(1), unless the authorization is terminated  or revoked sooner.       Influenza A by PCR NEGATIVE NEGATIVE Final   Influenza B by PCR NEGATIVE NEGATIVE Final    Comment: (NOTE) The Xpert Xpress SARS-CoV-2/FLU/RSV plus assay is intended as an aid in the diagnosis of influenza from Nasopharyngeal swab specimens and should not be used as a sole basis for treatment. Nasal washings and aspirates are unacceptable for Xpert Xpress SARS-CoV-2/FLU/RSV testing.  Fact Sheet for Patients: EntrepreneurPulse.com.au  Fact Sheet for Healthcare Providers: IncredibleEmployment.be  This test is not yet approved or cleared by the [place] FDA and has been authorized for detection and/or diagnosis of SARS-CoV-2 by FDA under an Emergency Use Authorization (EUA). This EUA will remain in effect (meaning this test can be used) for the duration of the COVID-19 declaration under Section 564(b)(1) of the Act, 21 U.S.C. section 360bbb-3(b)(1), unless the authorization is terminated or revoked.  Performed at [facility], [place] [address], [place], [place] [address]   Urine Culture     Status: Abnormal   Collection Time: 05/14/21 11:10 PM   Specimen: In/Out Cath Urine  Result Value Ref Range Status   Specimen Description IN/OUT CATH URINE  Final   Special Requests   Final    NONE Performed at [facility], [place] [address] [place], [place] [address]  Culture >=100,000  COLONIES/mL YEAST (A)  Final   Report Status 05/16/2021 FINAL  Final  Blood Culture (routine x 2)     Status: None (Preliminary result)   Collection Time: 05/15/21 12:43 AM   Specimen: BLOOD  Result Value Ref Range Status   Specimen Description BLOOD BLOOD LEFT WRIST  Final   Special Requests   Final    BOTTLES DRAWN AEROBIC AND ANAEROBIC Blood Culture adequate volume   Culture   Final    NO GROWTH 1 DAY Performed at [facility], [place] [address] [place], [place] [address]    Report Status PENDING  Incomplete         Radiology Studies: MR LUMBAR SPINE WO CONTRAST  Result Date: 05/15/2021 CLINICAL DATA:  Low back pain, infection suspected EXAM: MRI LUMBAR SPINE WITHOUT CONTRAST TECHNIQUE: Multiplanar, multisequence MR imaging of the lumbar spine was performed. No intravenous contrast was administered. COMPARISON:  Lumbar spine MRI 08/16/2015, same-day CT abdomen/pelvis FINDINGS: Segmentation: Standard; the lowest formed disc space is designated L5-S1. Alignment: There is trace retrolisthesis of L5 on S1, unchanged since 2017. Alignment at the other levels is normal. Vertebrae: Vertebral body heights are preserved. A focus of T1 and T2 hyperintensity in the L3 vertebral body is unchanged, likely an intraosseous hemangioma. There is no suspicious marrow signal abnormality. There is no marrow edema. There is a prominent Schmorl's node indenting the superior L5 endplate without associated edema. There is edema in the lower sacrum with surrounding soft tissue edema, better evaluated on the separately dictated sacrum MRI. There is no osseous destruction to suggest discitis/osteomyelitis. Conus medullaris and cauda equina: Conus extends to the L1 level. Conus and cauda equina appear normal. Paraspinal and other soft tissues: The paraspinal soft tissues are unremarkable. A saccular infrarenal abdominal aortic aneurysm is again seen, better evaluated on the same-day CT abdomen/pelvis. Disc levels:  There is marked intervertebral disc space narrowing at L5-S1. Facet arthropathy is most advanced at L4-L5 T12-L1: There is mild bilateral facet arthropathy without significant spinal canal or neural foraminal stenosis L1-L2: Mild degenerative endplate change and bilateral facet arthropathy without significant spinal canal or neural foraminal stenosis. L2-L3: There is a mild disc bulge and mild bilateral facet arthropathy without significant spinal canal or neural foraminal stenosis. L3-L4: There is a mild disc bulge and mild bilateral facet arthropathy resulting in mild left worse than right neural foraminal stenosis without significant spinal canal stenosis L4-L5: There is a mild disc bulge and moderate bilateral facet arthropathy resulting in mild bilateral neural foraminal stenosis without significant spinal canal stenosis L5-S1: There is a diffuse disc bulge with bilateral foraminal components, degenerative endplate change, and bilateral facet arthropathy resulting in moderate right and mild left neural foraminal stenosis. Overall, the above findings are not significantly changed since 2017. IMPRESSION: 1. Bone and soft tissue edema in the lower sacrum consistent with osteomyelitis, better evaluated on the separately dictated sacrum MRI. 2. No other evidence of discitis/osteomyelitis in the lumbar spine. 3. Moderate facet arthropathy at L4-L5. 4. Degenerative changes at L5-S1 result in moderate right and mild left neural foraminal stenosis, not significantly changed since 2017. 5. Mild degenerative changes at the remaining levels as detailed above without significant spinal canal or neural foraminal stenosis, also not significantly changed since 2017. 6. Saccular infrarenal abdominal aortic aneurysm, better evaluated on prior CT. Electronically Signed   By: Valetta Mole M.D.   On: 05/15/2021 08:25   CT Abdomen Pelvis W Contrast  Result Date: 05/15/2021 CLINICAL  DATA:  Abdominal pain, dark urine.  Difficulty  walking. EXAM: CT ABDOMEN AND PELVIS WITH CONTRAST TECHNIQUE: Multidetector CT imaging of the abdomen and pelvis was performed using the standard protocol following bolus administration of intravenous contrast. RADIATION DOSE REDUCTION: This exam was performed according to the departmental dose-optimization program which includes automated exposure control, adjustment of the mA and/or kV according to patient size and/or use of iterative reconstruction technique. CONTRAST:  74mL OMNIPAQUE IOHEXOL 300 MG/ML  SOLN COMPARISON:  04/27/2021. FINDINGS: Lower chest: The heart is enlarged and there is no pericardial effusion. Scattered coronary artery calcifications are noted. There is aneurysmal dilatation of the ascending aorta measuring 4.1 cm. There is bilateral lower lobe bronchiectasis with bronchial wall thickening. Consolidation is present in the left lung base and patchy airspace disease is noted at the right lung base. No no pleural effusion. Hepatobiliary: Multiple scattered cysts are present in the liver and unchanged from the prior exam. The gallbladder is distended measuring 5.3 cm and a few stones are identified. No biliary ductal dilatation. Pancreas: There is fatty atrophy of the pancreas. No pancreatic ductal dilatation or surrounding fat stranding. Spleen: Normal in size without focal abnormality. Adrenals/Urinary Tract: There is a stable nodule in the right adrenal gland measuring 1.5 cm. The left adrenal gland is within normal limits. No renal calculus or obstructive uropathy bilaterally. The kidneys enhance symmetrically. Subcentimeter hypodensities are present in the kidneys bilaterally and too small to further characterize. A Foley catheter is present in the urinary bladder and the bladder is nondistended. Stomach/Bowel: The stomach is within normal limits. No bowel obstruction, free air or pneumatosis. A normal appendix is present in the right lower quadrant. The rectum distends below the level of  the pubococcygeal line suggesting pelvic floor dysfunction. There is a large amount of stool in the rectum. A few scattered diverticula are present along the colon without evidence of diverticulitis. No focal bowel wall thickening. Vascular/Lymphatic: Atherosclerotic calcification of the aorta. There is redemonstration of a saccular infrarenal abdominal aortic aneurysm measuring 5.1 cm, not significantly changed from the prior exam. Aortobifemoral bypass grafts are noted. There is severe stenosis at the left femoral graft anastomosis. No abdominal or pelvic lymphadenopathy. Reproductive: The prostate gland is enlarged and contains multiple brachytherapy seeds and/or surgical clips. Other: A fat containing umbilical hernia is noted.  No free fluid. Musculoskeletal: Fixation hardware is noted at the left hip. Degenerative changes are present in the thoracolumbar spine. Sternotomy wires are noted over the midline. There is a skin defect over the posterior aspect of the sacrum and coccyx, unchanged from the prior exam. IMPRESSION: 1. No renal calculus or obstructive uropathy bilaterally. A Foley catheter is present in the urinary bladder in the bladder is decompressed. 2. Bronchiectasis with left lower lobe consolidation and patchy airspace disease at the lung bases bilaterally, concerning for pneumonia. 3. Distended gallbladder with cholelithiasis. 4. Large amount of stool in the rectum and moderate amount of retained stool in the colon, possible constipation. 5. Remaining chronic changes as described above. Electronically Signed   By: Brett Fairy M.D.   On: 05/15/2021 00:46   MR SACRUM SI JOINTS WO CONTRAST  Result Date: 05/15/2021 CLINICAL DATA:  Sacral decubitus ulcer. EXAM: MRI SACRUM WITHOUT CONTRAST TECHNIQUE: Multiplanar, multisequence MR imaging of the sacrum was performed. No intravenous contrast was administered. COMPARISON:  CT abdomen pelvis from same day. FINDINGS: Bones/Joint/Cartilage Abnormal  marrow edema with corresponding decreased T1 marrow signal involving the lower sacrum and coccyx, with partial bony  destruction of the coccyx, new since last November. No fracture or dislocation. Normal sacroiliac joints. No joint effusion. Muscles and Tendons Left greater than right pelvic muscle atrophy. Soft tissue Midline sacral decubitus ulcer extending to bone. Small fluid collections anterior and posterior to the lower sacrum measuring up to 1.6 cm (series 4, image 21). No soft tissue mass. Large stool ball in the rectum.  Foley catheter within the bladder. IMPRESSION: 1. Midline sacral decubitus ulcer extending to bone with underlying osteomyelitis of the lower sacrum and coccyx, new since November 2022. 2. Small fluid collections anterior and posterior to the lower sacrum measuring up to 1.6 cm, likely small abscesses. Electronically Signed   By: Titus Dubin M.D.   On: 05/15/2021 08:19   DG Chest Port 1 View  Result Date: 05/14/2021 CLINICAL DATA:  Possible sepsis EXAM: PORTABLE CHEST 1 VIEW COMPARISON:  04/27/2021 FINDINGS: Cardiac shadow is within normal limits. Postsurgical changes are noted. The lungs are well aerated bilaterally. Left basilar atelectasis is noted with mild blunting of the costophrenic angle new from the prior exam. IMPRESSION: Left basilar atelectasis and small effusion. Electronically Signed   By: Inez Catalina M.D.   On: 05/14/2021 23:21      Scheduled Meds:  vitamin C  500 mg Oral BID   Chlorhexidine Gluconate Cloth  6 each Topical Daily   [START ON 05/20/2021] collagenase   Topical Daily   docusate sodium  100 mg Oral BID   enoxaparin (LOVENOX) injection  40 mg Subcutaneous Q24H   feeding supplement  237 mL Oral TID BM   insulin aspart  0-9 Units Subcutaneous TID WC   multivitamin with minerals  1 tablet Oral Daily   senna  1 tablet Oral BID   sodium hypochlorite   Irrigation BID   tamsulosin  0.4 mg Oral Daily   zinc sulfate  220 mg Oral Daily   Continuous  Infusions:  ceFEPime (MAXIPIME) IV 2 g (05/16/21 0844)   lactated ringers 100 mL/hr at 05/16/21 0456   potassium chloride     vancomycin 750 mg (05/16/21 1317)     LOS: 1 day      Debbe Odea, MD [facility] Pager: [email or web address] 05/16/2021, 1:35 PM

## 2021-05-17 DIAGNOSIS — Z66 Do not resuscitate: Secondary | ICD-10-CM

## 2021-05-17 DIAGNOSIS — Z515 Encounter for palliative care: Secondary | ICD-10-CM

## 2021-05-17 DIAGNOSIS — Z7189 Other specified counseling: Secondary | ICD-10-CM

## 2021-05-17 LAB — GLUCOSE, CAPILLARY
Glucose-Capillary: 102 mg/dL — ABNORMAL HIGH (ref 70–99)
Glucose-Capillary: 110 mg/dL — ABNORMAL HIGH (ref 70–99)
Glucose-Capillary: 111 mg/dL — ABNORMAL HIGH (ref 70–99)

## 2021-05-17 MED ORDER — MORPHINE SULFATE (PF) 2 MG/ML IV SOLN
2.0000 mg | Freq: Four times a day (QID) | INTRAVENOUS | Status: DC
  Administered 2021-05-17 – 2021-05-19 (×9): 2 mg via INTRAVENOUS
  Filled 2021-05-17 (×10): qty 1

## 2021-05-17 MED ORDER — GLYCOPYRROLATE 1 MG PO TABS
1.0000 mg | ORAL_TABLET | ORAL | Status: DC | PRN
  Filled 2021-05-17: qty 1

## 2021-05-17 MED ORDER — GLYCOPYRROLATE 0.2 MG/ML IJ SOLN
0.2000 mg | INTRAMUSCULAR | Status: DC | PRN
  Administered 2021-05-18 – 2021-05-19 (×2): 0.2 mg via INTRAVENOUS
  Filled 2021-05-17 (×4): qty 1

## 2021-05-17 MED ORDER — POLYVINYL ALCOHOL 1.4 % OP SOLN
1.0000 [drp] | Freq: Four times a day (QID) | OPHTHALMIC | Status: DC | PRN
  Filled 2021-05-17: qty 15

## 2021-05-17 MED ORDER — LORAZEPAM 2 MG/ML IJ SOLN: 0.5000 mg | INTRAMUSCULAR | Status: DC | PRN

## 2021-05-17 MED ORDER — BIOTENE DRY MOUTH MT LIQD
15.0000 mL | OROMUCOSAL | Status: DC | PRN
Start: 2021-05-17 — End: 2021-05-20

## 2021-05-17 MED ORDER — ORAL CARE MOUTH RINSE
15.0000 mL | Freq: Two times a day (BID) | OROMUCOSAL | Status: DC
  Administered 2021-05-17 – 2021-05-18 (×3): 15 mL via OROMUCOSAL

## 2021-05-17 MED ORDER — GLYCOPYRROLATE 0.2 MG/ML IJ SOLN
0.2000 mg | INTRAMUSCULAR | Status: DC | PRN
  Filled 2021-05-17: qty 1

## 2021-05-17 NOTE — Progress Notes (Signed)
° °  Palliative Medicine Inpatient Follow Up Note  Palliative care has seen the order for consult on Chris Payne.   I have called patients son, Chris Payne six times. I am unable to leave a voicemail.   I have text messaged both numbers on file.   We will continue to follow. Anticipate having goals of care conversations when son gets back to the Palliative team.  No Charge. ______________________________________________________________________________________ [place] Team Team Cell Phone: [phone] Please utilize secure chat with additional questions, if there is no response within 30 minutes please call the above phone number  Palliative Medicine Team providers are available by phone from 7am to 7pm daily and can be reached through the team cell phone.  Should this patient require assistance outside of these hours, please call the patient's attending physician.

## 2021-05-17 NOTE — Progress Notes (Signed)
Pharmacy Antibiotic Note  Chris Payne is a 71 y.o. 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  Pharmacy has been consulted for vancomycin and cefepime dosing. Family is exploring home hospice.  Plan: Vancomycin 750 IV every 12 hours. Cefepime 2g IV q 8 hours Monitor clinical progress, cultures/sensitivities, renal function, abx plan. Vancomycin levels as indicated    Temp (24hrs), Avg:98 F (36.7 C), Min:97.8 F (36.6 C), Max:98.1 F (36.7 C)  Recent Labs  Lab 05/14/21 2213 05/15/21 0053 05/15/21 0432 05/16/21 0751  WBC 15.1*  --  11.9*  --   CREATININE 0.81  --  0.66 0.63  LATICACIDVEN  --  2.0*  --   --     Estimated Creatinine Clearance: 78 mL/min (by C-G formula based on SCr of 0.63 mg/dL).    No Known Allergies  Antimicrobials this admission: 1/19 Cefepime >> 1/19 vanc >> 1/19 flagyl x 1  Dose adjustments this admission:   Microbiology results: 1/19 BCx - ng x 2d 1/18 UC - yeast (chronic foley)   Thank you for allowing [place] to participate in this patients care. Jens Som, PharmD 05/17/2021 8:16 AM  **Pharmacist phone directory can be found on [email or web address] listed under [place]**

## 2021-05-17 NOTE — Progress Notes (Addendum)
PROGRESS NOTE    Chris Payne   JIR:[id number]  DOB: [birth year]  DOA: 05/14/2021 PCP: Benito Mccreedy, MD   Brief Narrative:  Chris Payne is a 71 year old male who lives at home has BPH with urinary retention and a chronic Foley, stage IV sacral decubitus ulcer, PAD, CVA, CAD status post CABG, HFrEF, hypertension.  He is bedbound.  He was in the hospital from 04/27/2021 through 05/03/2021 with sepsis thought to be secondary to a UTI.  He was discharged home with 4 days of Augmentin.  He returns for pain in the Foley catheter.  He also states to me that he has pain in his bottom.    In ED: Foley was replaced over a liter of urine was obtained. He was noted to have a temperature of 100.6 rectally, WBC count 15.1 and heart rates greater than 100  He was given Vanco and cefepime and an MRI of the sacrum was ordered to look for osteomyelitis.  Subjective: Per RN, he is refusing labs today. He remains confused for me. Today he has no complaints.     Assessment & Plan:   Principal Problem: Severe sepsis ([place])- POA likely secondary to infection of stage IV sacral ulcer - leukocytosis, tachycardia and a fever - Could be that his decubitus ulcer that is infected - MRI does show osteomyelitis -Continue Vanc and cefepime   -He lives at home with stepson and wife, is unable to be turned and this is likely not going to heal -I have spoken with his son who states hospice came in to speak with them but he wanted to speak with others before he made a final decision -the patient remains confused, is declining hydrotherapy, meds and labs a.  -On 1/19, I requested a palliative care consult to speak with the son and help him to come to a decision-   -As needed morphine ordered for pain - I feel he is a hospice home candidate   Active Problems:  Confusion - Possibly has acute metabolic encephalopathy with some underlying dementia - not much improvement  Chronic Foley with  obstruction -Foley changed in ED with a good amount of urine output subsequently -Urine culture shows greater than 100,000 colonies of yeast  - dc Flomax as he does not need it in setting of foley and is intermittently taking meds  Hypokalemia and hypomagnesemia - Replace as able and follow    Hypertension  -His BP is low and therefore we are holding metoprolol and Lasix -His last echo in 11/22 showed a normal EF and grade 1 diastolic dysfunction   Pressure Injury 03/27/21 Sacrum Unstageable - Full thickness tissue loss in which the base of the injury is covered by slough (yellow, tan, gray, green or brown) and/or eschar (tan, brown or black) in the wound bed. necrotic tissue and slough (Active)  03/27/21 1345  Location: Sacrum  Location Orientation:   Staging: Unstageable - Full thickness tissue loss in which the base of the injury is covered by slough (yellow, tan, gray, green or brown) and/or eschar (tan, brown or black) in the wound bed.  Wound Description (Comments): necrotic tissue and slough  Present on Admission: Yes     Pressure Injury 04/28/21 Foot Left;Posterior Stage 3 -  Full thickness tissue loss. Subcutaneous fat may be visible but bone, tendon or muscle are NOT exposed. prevalon boots and foam heel protectors intact (Active)  04/28/21 2000  Location: Foot (lateral heel)  Location Orientation: Left;Posterior  Staging: Stage 3 -  Full thickness tissue loss. Subcutaneous fat may be visible but bone, tendon or muscle are NOT exposed.  Wound Description (Comments): prevalon boots and foam heel protectors intact  Present on Admission: Yes     Pressure Injury 04/28/21 Heel Left Deep Tissue Pressure Injury - Purple or maroon localized area of discolored intact skin or blood-filled blister due to damage of underlying soft tissue from pressure and/or shear. blood filled blister left heel (Active)  04/28/21 2000  Location: Heel  Location Orientation: Left  Staging: Deep Tissue  Pressure Injury - Purple or maroon localized area of discolored intact skin or blood-filled blister due to damage of underlying soft tissue from pressure and/or shear.  Wound Description (Comments): blood filled blister left heel  Present on Admission: Yes     Pressure Injury 04/29/21 Sacrum Medial Unstageable - Full thickness tissue loss in which the base of the injury is covered by slough (yellow, tan, gray, green or brown) and/or eschar (tan, brown or black) in the wound bed. PT only, large painfu wound (Active)  04/29/21 1130  Location: Sacrum  Location Orientation: Medial  Staging: Unstageable - Full thickness tissue loss in which the base of the injury is covered by slough (yellow, tan, gray, green or brown) and/or eschar (tan, brown or black) in the wound bed.  Wound Description (Comments): PT only, large painfu wound  Present on Admission: Yes      Constipation -Given enema in the ED    Diabetes mellitus, with hyperglycemia - He takes metformin at home - Continue sliding scale insulin in the hospital Hemoglobin A1C    Component Value Date/Time   HGBA1C 8.2 (H) 03/26/2021 2028    DVT prophylaxis: enoxaparin (LOVENOX) injection 40 mg Start: 05/15/21 0800   Code Status: Full code Family Communication: [name] step son- [phone] Level of Care: Level of care: Telemetry Medical Disposition Plan:  Status is: Inpatient  Remains inpatient appropriate because: cont IV abx while waiting on palliative care discussions   Consultants:  Palliative care Procedures:  none Antimicrobials:  Anti-infectives (From admission, onward)    Start     Dose/Rate Route Frequency Ordered Stop   05/15/21 1200  vancomycin (VANCOREADY) IVPB 750 mg/150 mL        750 mg 150 mL/hr over 60 Minutes Intravenous Every 12 hours 05/14/21 2319     05/15/21 0600  ceFEPIme (MAXIPIME) 2 g in sodium chloride 0.9 % 100 mL IVPB        2 g 200 mL/hr over 30 Minutes Intravenous Every 8 hours 05/14/21  2319     05/14/21 2315  ceFEPIme (MAXIPIME) 2 g in sodium chloride 0.9 % 100 mL IVPB        2 g 200 mL/hr over 30 Minutes Intravenous  Once 05/14/21 2310 05/15/21 0129   05/14/21 2315  metroNIDAZOLE (FLAGYL) IVPB 500 mg        500 mg 100 mL/hr over 60 Minutes Intravenous  Once 05/14/21 2310 05/15/21 0232   05/14/21 2315  vancomycin (VANCOCIN) IVPB 1000 mg/200 mL premix  Status:  Discontinued        1,000 mg 200 mL/hr over 60 Minutes Intravenous  Once 05/14/21 2310 05/14/21 2312   05/14/21 2315  vancomycin (VANCOREADY) IVPB 1500 mg/300 mL        1,500 mg 150 mL/hr over 120 Minutes Intravenous  Once 05/14/21 2312 05/15/21 0434        Objective: Vitals:   05/16/21 1642 05/16/21 2115 05/17/21 0647 05/17/21 0753  BP: Marland Kitchen)  100/53 (!) 104/59 134/72 119/79  Pulse: 85 83 98 98  Resp: 16 18 16 18   Temp:  97.8 F (36.6 C)  98.1 F (36.7 C)  TempSrc:  Oral  Oral  SpO2: 98% 100% 100% 99%    Intake/Output Summary (Last 24 hours) at 05/17/2021 1122 Last data filed at 05/17/2021 0600 Gross per 24 hour  Intake 1650 ml  Output 1900 ml  Net -250 ml    There were no vitals filed for this visit.  Examination: General exam: Appears comfortable  HEENT:  oral mucosa moist, no sclera icterus or thrush Respiratory system: Clear to auscultation. Respiratory effort normal. Cardiovascular system: S1 & S2 heard, regular rate and rhythm Gastrointestinal system: Abdomen soft, non-tender, nondistended. Normal bowel sounds   Central nervous system: Alert and oriented only to person.   Extremities: No cyanosis, clubbing- has pedal edema Psychiatry:  Mood & affect appropriate.   Skin: multiple wounds, skin tears and abrasion on body, numerous bruises   Psychiatry:  Mood & affect appropriate.     Data Reviewed: I have personally reviewed following labs and imaging studies  CBC: Recent Labs  Lab 05/14/21 2213 05/15/21 0432  WBC 15.1* 11.9*  NEUTROABS 12.6*  --   HGB 13.2 11.0*  HCT 41.5  35.8*  MCV 105.6* 106.2*  PLT 288 378    Basic Metabolic Panel: Recent Labs  Lab 05/14/21 2213 05/15/21 0432 05/16/21 0751  NA 134* 135 133*  K 3.4* 3.1* 2.7*  CL 98 101 102  CO2 25 26 26   GLUCOSE 142* 110* 86  BUN 23 22 14   CREATININE 0.81 0.66 0.63  CALCIUM 8.8* 8.2* 8.1*  MG  --   --  1.4*    GFR: Estimated Creatinine Clearance: 78 mL/min (by C-G formula based on SCr of 0.63 mg/dL). Liver Function Tests: Recent Labs  Lab 05/14/21 2213 05/15/21 0432  AST 27 20  ALT 26 21  ALKPHOS 103 86  BILITOT 1.1 0.6  PROT 6.7 5.6*  ALBUMIN 2.1* 1.7*    No results for input(s): LIPASE, AMYLASE in the last 168 hours. No results for input(s): AMMONIA in the last 168 hours. Coagulation Profile: Recent Labs  Lab 05/15/21 0432  INR 1.3*    Cardiac Enzymes: No results for input(s): CKTOTAL, CKMB, CKMBINDEX, TROPONINI in the last 168 hours. BNP (last 3 results) No results for input(s): PROBNP in the last 8760 hours. HbA1C: No results for input(s): HGBA1C in the last 72 hours. CBG: Recent Labs  Lab 05/16/21 0803 05/16/21 1150 05/16/21 1644 05/16/21 2116 05/17/21 0754  GLUCAP 103* 84 73 118* 102*    Lipid Profile: No results for input(s): CHOL, HDL, LDLCALC, TRIG, CHOLHDL, LDLDIRECT in the last 72 hours. Thyroid Function Tests: No results for input(s): TSH, T4TOTAL, FREET4, T3FREE, THYROIDAB in the last 72 hours. Anemia Panel: No results for input(s): VITAMINB12, FOLATE, FERRITIN, TIBC, IRON, RETICCTPCT in the last 72 hours. Urine analysis:    Component Value Date/Time   COLORURINE ORANGE (A) 05/14/2021 2159   APPEARANCEUR TURBID (A) 05/14/2021 2159   LABSPEC >1.030 (H) 05/14/2021 2159   PHURINE 6.0 05/14/2021 2159   GLUCOSEU NEGATIVE 05/14/2021 2159   HGBUR LARGE (A) 05/14/2021 2159   BILIRUBINUR SMALL (A) 05/14/2021 2159   BILIRUBINUR small 08/23/2013 [place] 05/14/2021 2159   PROTEINUR 100 (A) 05/14/2021 2159   UROBILINOGEN 1.0  02/08/2015 1902   NITRITE NEGATIVE 05/14/2021 2159   LEUKOCYTESUR MODERATE (A) 05/14/2021 2159   Sepsis Labs: @LABRCNTIP (procalcitonin:4,lacticidven:4) )  Recent Results (from the past 240 hour(s))  Urine Culture     Status: Abnormal   Collection Time: 05/08/21 11:31 AM   Specimen: Urine, Catheterized  Result Value Ref Range Status   Specimen Description   Final    URINE, CATHETERIZED Performed at [facility] [address] [place], [place] [address]    Special Requests   Final    NONE Performed at [facility], [place] [address], [place], [place] [address]    Culture >=100,000 COLONIES/mL YEAST (A)  Final   Report Status 05/10/2021 FINAL  Final  Resp Panel by RT-PCR (Flu A&B, Covid) Nasopharyngeal Swab     Status: None   Collection Time: 05/14/21 11:10 PM   Specimen: Nasopharyngeal Swab; Nasopharyngeal(NP) swabs in vial transport medium  Result Value Ref Range Status   SARS Coronavirus 2 by RT PCR NEGATIVE NEGATIVE Final    Comment: (NOTE) SARS-CoV-2 target nucleic acids are NOT DETECTED.  The SARS-CoV-2 RNA is generally detectable in upper respiratory specimens during the acute phase of infection. The lowest concentration of SARS-CoV-2 viral copies this assay can detect is 138 copies/mL. A negative result does not preclude SARS-Cov-2 infection and should not be used as the sole basis for treatment or other patient management decisions. A negative result may occur with  improper specimen collection/handling, submission of specimen other than nasopharyngeal swab, presence of viral mutation(s) within the areas targeted by this assay, and inadequate number of viral copies(<138 copies/mL). A negative result must be combined with clinical observations, patient history, and epidemiological information. The expected result is Negative.  Fact Sheet for Patients:  EntrepreneurPulse.com.au  Fact Sheet for Healthcare Providers:   IncredibleEmployment.be  This test is no t yet approved or cleared by the [place] FDA and  has been authorized for detection and/or diagnosis of SARS-CoV-2 by FDA under an Emergency Use Authorization (EUA). This EUA will remain  in effect (meaning this test can be used) for the duration of the COVID-19 declaration under Section 564(b)(1) of the Act, 21 U.S.C.section 360bbb-3(b)(1), unless the authorization is terminated  or revoked sooner.       Influenza A by PCR NEGATIVE NEGATIVE Final   Influenza B by PCR NEGATIVE NEGATIVE Final    Comment: (NOTE) The Xpert Xpress SARS-CoV-2/FLU/RSV plus assay is intended as an aid in the diagnosis of influenza from Nasopharyngeal swab specimens and should not be used as a sole basis for treatment. Nasal washings and aspirates are unacceptable for Xpert Xpress SARS-CoV-2/FLU/RSV testing.  Fact Sheet for Patients: EntrepreneurPulse.com.au  Fact Sheet for Healthcare Providers: IncredibleEmployment.be  This test is not yet approved or cleared by the [place] FDA and has been authorized for detection and/or diagnosis of SARS-CoV-2 by FDA under an Emergency Use Authorization (EUA). This EUA will remain in effect (meaning this test can be used) for the duration of the COVID-19 declaration under Section 564(b)(1) of the Act, 21 U.S.C. section 360bbb-3(b)(1), unless the authorization is terminated or revoked.  Performed at [facility], [place] [address], [place], [place] [address]   Urine Culture     Status: Abnormal   Collection Time: 05/14/21 11:10 PM   Specimen: In/Out Cath Urine  Result Value Ref Range Status   Specimen Description IN/OUT CATH URINE  Final   Special Requests   Final    NONE Performed at [facility], [place] [address], [place], [place] [address]    Culture >=100,000 COLONIES/mL YEAST (A)  Final  Report Status 05/16/2021 FINAL  Final  Blood  Culture (routine x 2)     Status: None (Preliminary result)   Collection Time: 05/15/21 12:43 AM   Specimen: BLOOD  Result Value Ref Range Status   Specimen Description BLOOD BLOOD LEFT WRIST  Final   Special Requests   Final    BOTTLES DRAWN AEROBIC AND ANAEROBIC Blood Culture adequate volume   Culture   Final    NO GROWTH 2 DAYS Performed at [facility], [address] [place], [place] [address]    Report Status PENDING  Incomplete         Radiology Studies: No results found.    Scheduled Meds:  vitamin C  500 mg Oral BID   Chlorhexidine Gluconate Cloth  6 each Topical Daily   [START ON 05/20/2021] collagenase   Topical Daily   docusate sodium  100 mg Oral BID   enoxaparin (LOVENOX) injection  40 mg Subcutaneous Q24H   feeding supplement  237 mL Oral TID BM   insulin aspart  0-9 Units Subcutaneous TID WC   multivitamin with minerals  1 tablet Oral Daily   senna  1 tablet Oral BID   sodium hypochlorite   Irrigation BID   tamsulosin  0.4 mg Oral Daily   zinc sulfate  220 mg Oral Daily   Continuous Infusions:  ceFEPime (MAXIPIME) IV 2 g (05/17/21 0946)   lactated ringers 100 mL/hr at 05/16/21 1527   vancomycin 750 mg (05/17/21 0130)     LOS: 2 days      Debbe Odea, MD [facility] Pager: [email or web address] 05/17/2021, 11:22 AM

## 2021-05-17 NOTE — Plan of Care (Signed)
°  Problem: Clinical Measurements: Goal: Ability to maintain clinical measurements within normal limits will improve 05/17/2021 1900 by Karis Juba, RN Outcome: Progressing  Goal: Will remain free from infection 05/17/2021 1900 by Karis Juba, RN Outcome: Progressing  Goal: Diagnostic test results will improve 05/17/2021 1900 by Karis Juba, RN Outcome: Progressing  Goal: Respiratory complications will improve 05/17/2021 1900 by Karis Juba, RN Outcome: Progressing  Goal: Cardiovascular complication will be avoided 05/17/2021 1900 by Karis Juba, RN Outcome: Progressing    Problem: Elimination: Goal: Will not experience complications related to bowel motility 05/17/2021 1900 by Karis Juba, RN Outcome: Progressing  Goal: Will not experience complications related to urinary retention 05/17/2021 1900 by Karis Juba, RN Outcome: Progressing    Problem: Pain Managment: Goal: General experience of comfort will improve 05/17/2021 1900 by Karis Juba, RN Outcome: Progressing    Problem: Safety: Goal: Ability to remain free from injury will improve 05/17/2021 1900 by Karis Juba, RN Outcome: Progressing    Problem: Skin Integrity: Goal: Risk for impaired skin integrity will decrease 05/17/2021 1900 by Karis Juba, RN Outcome: Progressing

## 2021-05-17 NOTE — Progress Notes (Addendum)
°   05/17/21 1529  Clinical Encounter Type  Visited With Patient  Visit Type Critical Care;Spiritual support  Referral From Other (Comment) (from text -  no number given)  Consult/Referral To Chaplain   Chaplain Myna Freimark responded to page requesting chaplain visit because family is in distress about the patient's dying process. When chaplain arrived, the attending nurse was at his bedside, there were no support person present. The patient appeared confused and said he thought I was holding a baby. Patient reached for Chaplain's hand while praying at bedside. Chaplain remains available for follow up spiritual/emotional support as needed. This note was prepared by Jeanine Luz, M.Div..  For questions please contact by phone [phone].

## 2021-05-17 NOTE — Consult Note (Signed)
Palliative Medicine Inpatient Consult Note  Consulting Provider: Debbe Odea, MD  Reason for consult:   [facility] Palliative Medicine Consult  Reason for Consult? Chris Payne, lives with stepson who states he is the main decision maker> Chris Payne [phone]   Recurrent admissions, chronic foley stage 4 sacral decubitus that will never heal   HPI:  Per intake H&P --> Chris Payne is a 71 year old male who lives at home has BPH with urinary retention and a chronic Foley, stage IV sacral decubitus ulcer, PAD, CVA, CAD status post CABG, HFrEF, hypertension.  He is bedbound.   Palliative care has been asked to get involved to address goals of care in the setting of a notable decline.  Clinical Assessment/Goals of Care:  *Please note that this is a verbal dictation therefore any spelling or grammatical errors are due to the "[place] One" system interpretation.  I have reviewed medical records including EPIC notes, labs and imaging, received report from bedside RN, assessed the patient who is resting in bed in NAD.    I Called patients step son, Chris Payne  to further discuss diagnosis prognosis, GOC, EOL wishes, disposition and options.   I introduced Palliative Medicine as specialized medical care for people living with serious illness. It focuses on providing relief from the symptoms and stress of a serious illness. The goal is to improve quality of life for both the patient and the family.  Medical History Review and Understanding:  Chris Payne and I reviewed [name] medical history inclusive of BPH with chronic foley, PAD, CVA, heart failure, prior aortic aneurysm, and sacral ulcer.   Discussed the concern over Chris Payne state presently in the setting of rehospitalization's and sepsis d/t pressure injury. Reviewed the term osteomyelitis and how this is difficult to treat especially in the sacral area.   Social History: Chris Payne lives in the [place] area. He has  been married to his spouse for over fifteen years. She too is in poor health. He has two sons and one daughter. 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 He is a man of faith and practices within the [facility] denomination.  Functional and Nutritional State:  Chris Payne has been hospitalized now for two days. He was living at home prior to his initial admission in November. He was able to walk then and perform basic bADLs. He went to [name] since then and had developed a pretty noticeable Stg IV decubitus ulcer. He is eating 0% of meals.   Advance Directives: A detailed discussion was had today regarding advanced directives.Will obtain a copy of these.   Code Status: I shared the importance of considering a DNAR/DNI in he setting of patients chronically frail state and co-morbidities. We reviewed that we are most likely to cause trauma and further harm than any true benefit in a resuscitation effort.   Goals for the Future:  Patients son shares that he needs a safe place for his father to be,. He expresses the inability to care for him.  Discussion:  Chris Payne shares that this hospitalization is the first time anyone has shared with him how poorly Chris Payne is doing. He expresses that he is very saddened by this news. He goes on to share that he is mentally and emotionally overburdened with caring for his very ill mother. Provided time for him to express his feelings.   Discussed the importance of continued conversation with family and their  medical providers regarding overall plan of care and treatment options, ensuring  decisions are within the context of the patients values and GOCs. _______________________________ Addendum:  Family meeting held this evening with Chris Payne's son, Chris Payne and his significant other.  Providers present were Dr. Wynelle Payne, Chris Payne, and myself.  A comprehensive review of patient's medical conditions was held.  Patient's son was very distressed about the  rapidity of his decline in the last 2 to 3 weeks.  He shares that he is astounded regarding Chris Payne sacral decubitus injury and wonders why "no one told him".  We allowed him time to express himself and discussed the nature and significance of a sacral decubitus ulcer which migrates to the bone.  Dr. Wynelle Payne was very forthright sharing that she believes Chris Payne is encroaching his end-of-life journey and at this point in time all that we can do is promote comfort and decrease suffering.  Reviewed the possibility of transitioning to comfort measures. We talked about transition to comfort measures in house and what that would entail inclusive of medications to control pain, dyspnea, agitation, nausea, itching, and hiccups.    We discussed stopping all uneccessary measures such as intravenous fluids, intravenous antibiotics, cardiac monitoring, blood draws, needle sticks, and frequent vital signs.   Utilized reflective listening throughout our time together as Chris Payne was quite tearful.  Discussed making Chris Payne a DO NOT RESUSCITATE CODE STATUS which Chris Payne was in favor of as he does not feel Chris Payne would fare well any resuscitation situation.  Patient's son and his significant other would like some time to process all that has been shared with them.  Tomorrow they will have a decision on whether or not to pursue inpatient hospice.  Question and concerns answered.  Decision Maker: Chris Payne (son): [phone]  SUMMARY OF RECOMMENDATIONS   DNAR/DNI  Comfort Measures  Medications per [facility] - will add low dose morphine ATC to aid in generalized pain  Spiritual support  Appreciate TOC for consideration of inpatient hospice at [facility]  Ongoing palliative care support  Code Status/Advance Care Planning: DNAR/DNI    Palliative Prophylaxis:  Aspiration, Bowel Regimen, Delirium Protocol, Frequent Pain Assessment, Oral Care, Palliative Wound Care, and Turn  Reposition  Additional Recommendations (Limitations, Scope, Preferences): Comfort measures    Psycho-social/Spiritual:  Desire for further Chaplaincy support: Yes Additional Recommendations: Education on end-of-life process   Prognosis: Less than 2 weeks  Discharge Planning: Discharge plan contingent upon family's decision on whether or not to pursue inpatient hospice  Vitals:   05/17/21 0647 05/17/21 0753  BP: 134/72 119/79  Pulse: 98 98  Resp: 16 18  Temp:  98.1 F (36.7 C)  SpO2: 100% 99%    Intake/Output Summary (Last 24 hours) at 05/17/2021 1312 Last data filed at 05/17/2021 0600 Gross per 24 hour  Intake 1650 ml  Output 1900 ml  Net -250 ml   Gen:  Elderly Caucasian M in NAD HEENT: moist mucous membranes CV: Regular rate and rhythm  PULM:  On 2LPM [place] ABD: soft/nontender  EXT: No edema  Neuro: Alert and oriented to self only  PPS: 10%   This conversation/these recommendations were discussed with patient primary care team, Dr. Wynelle Payne  Total Time: 118  MDM High  Medical Decision Making:4 #/Complex Problems:   4                   Data Reviewed:  4               Management:4 (1-Straightforward, 2-Low, 3-Moderate, 4-High) ______________________________________________________ [place]  Team Team Cell Phone: [phone] Please utilize secure chat with additional questions, if there is no response within 30 minutes please call the above phone number  Palliative Medicine Team providers are available by phone from 7am to 7pm daily and can be reached through the team cell phone.  Should this patient require assistance outside of these hours, please call the patient's attending physician.

## 2021-05-18 DIAGNOSIS — G9341 Metabolic encephalopathy: Secondary | ICD-10-CM

## 2021-05-18 NOTE — Progress Notes (Signed)
° °  I have been able to communicate with the pt's son via e-mail off and on today. He would like to take the pt home. He has equipment in the home of hospital bed, walker and wheelchair. I have ordered to be delivered from [employer] an alternating air pressure mattress, over bed table, oxygen, suction machine, and hoyer lift. I have requested that [employer] just drive by and drop off hoping family will be there when they come by. [employer] typically does not deliver until they know someone is in home and verifies this with a telephone call on the way. I have let them know the pt' son does not have a working phone at this time.   I will continue to follow and once we know equipment is in home we can arrange for pt to go home. I wlll coordinate with the Case Manager at hospital.   [place] [phone]

## 2021-05-18 NOTE — Progress Notes (Addendum)
° °  Palliative Medicine Inpatient Follow Up Note  Consulting Provider: Debbe Odea, MD   Reason for consult:   [place] Palliative Medicine Consult  Reason for Consult? Chris Payne, lives with stepson who states he is the main decision maker> [name] [phone]    Recurrent admissions, chronic foley stage 4 sacral decubitus that will never heal    HPI:  Per intake H&P --> Chris KREMPASKY is a 71 year old male who lives at home has BPH with urinary retention and a chronic Foley, stage IV sacral decubitus ulcer, PAD, CVA, CAD status post CABG, HFrEF, hypertension.  He is bedbound.    Palliative care has been asked to get involved to address goals of care in the setting of a notable decline.  Today's Discussion (05/18/2021):  *Please note that this is a verbal dictation therefore any spelling or grammatical errors are due to the "[place] One" system interpretation.  Chart reviewed inclusive of progress notes, laboratory results, and diagnostic images.   I met at bedside with Chris Payne this morning. There was no family present. He appeared to be comfortable and in no visible distress.  I have called patient's son this morning to review and discuss the discharge plan. He has been having recurring issues with his telephone therefore I left a VM.  I have reached out to [place] team as she has corresponded with Toula Moos via email. Plan for additional DME to be delivered.   Objective Assessment: Vital Signs Vitals:   05/17/21 1946 05/18/21 0605  BP: (!) 115/57 (!) 112/54  Pulse: 82 75  Resp: 14 12  Temp: 98.6 F (37 C) (!) 97.5 F (36.4 C)  SpO2: 95% 91%    Intake/Output Summary (Last 24 hours) at 05/18/2021 7616 Last data filed at 05/18/2021 0500 Gross per 24 hour  Intake --  Output 1000 ml  Net -1000 ml    Gen:  Elderly Caucasian M in NAD HEENT: moist mucous membranes CV: Regular rate and rhythm  PULM:  On 2LPM  [place] ABD: soft/nontender  EXT: No edema  Neuro: Somnolent  SUMMARY OF RECOMMENDATIONS   DNAR/DNI   Comfort Measures   Medications per MAR - continue low dose morphine ATC to aid in generalized pain   Spiritual support   Appreciate TOC and [facility] helping to guide family regarding the living situation that may best suit Mancel Bale needs   Ongoing palliative care support  MDM - High  Medical Decision Making: 4 #/Complex Problems: 4                     Data Reviewed:    4             Management: 4 (1-Straightforward, 2-Low, 3-Moderate, 4-High) ______________________________________________________________________________________ [place] Palliative Medicine Team Team Cell Phone: [phone] Please utilize secure chat with additional questions, if there is no response within 30 minutes please call the above phone number  Palliative Medicine Team providers are available by phone from 7am to 7pm daily and can be reached through the team cell phone.  Should this patient require assistance outside of these hours, please call the patient's attending physician.

## 2021-05-18 NOTE — Plan of Care (Signed)
  Problem: Pain Managment: Goal: General experience of comfort will improve Outcome: Progressing   Problem: Safety: Goal: Ability to remain free from injury will improve Outcome: Progressing   Problem: Skin Integrity: Goal: Risk for impaired skin integrity will decrease Outcome: Progressing   

## 2021-05-18 NOTE — Progress Notes (Signed)
PROGRESS NOTE    Chris Payne   WVP:[id number]  DOB: 1950/05/12  DOA: 05/14/2021 PCP: Benito Mccreedy, MD   Brief Narrative:  Chris Payne is a 71 year old male who lives at home has BPH with urinary retention and a chronic Foley, stage IV sacral decubitus ulcer, PAD, CVA, CAD status post CABG, HFrEF, hypertension.  He is bedbound.  He was in the hospital from 04/27/2021 through 05/03/2021 with sepsis thought to be secondary to a UTI.  He was discharged home with 4 days of Augmentin.  He returns for pain in the Foley catheter.  He also states to me that he has pain in his bottom.    In ED: Foley was replaced over a liter of urine was obtained. He was noted to have a temperature of 100.6 rectally, WBC count 15.1 and heart rates greater than 100  He was given Vanco and cefepime and an MRI of the sacrum was ordered to look for osteomyelitis.  Subjective: He is sleepy but awakens easily. He has no complaints for me.    Assessment & Plan:   Principal Problem: Severe sepsis ([place])- POA likely secondary to infection of stage IV sacral ulcer - leukocytosis, tachycardia and a fever - Could be that his decubitus ulcer that is infected - MRI does show osteomyelitis -As needed morphine ordered for pain - I feel he is a hospice home candidate and have discussed this with the son- transitioned to comfort care on 1/21 after further conversation between myself, the son and palliative care- appreciate palliative care assistance- awaiting hospice home bed   Active Problems:  Confusion - Possibly has acute metabolic encephalopathy with some underlying dementia    Chronic Foley with obstruction Hypokalemia and hypomagnesemia Hypertension      Pressure Injury 03/27/21 Sacrum Unstageable - Full thickness tissue loss in which the base of the injury is covered by slough (yellow, tan, gray, green or brown) and/or eschar (tan, brown or black) in the wound bed. necrotic tissue and slough  (Active)  03/27/21 1345  Location: Sacrum  Location Orientation:   Staging: Unstageable - Full thickness tissue loss in which the base of the injury is covered by slough (yellow, tan, gray, green or brown) and/or eschar (tan, brown or black) in the wound bed.  Wound Description (Comments): necrotic tissue and slough  Present on Admission: Yes     Pressure Injury 04/28/21 Foot Left;Posterior Stage 3 -  Full thickness tissue loss. Subcutaneous fat may be visible but bone, tendon or muscle are NOT exposed. prevalon boots and foam heel protectors intact (Active)  04/28/21 2000  Location: Foot (lateral heel)  Location Orientation: Left;Posterior  Staging: Stage 3 -  Full thickness tissue loss. Subcutaneous fat may be visible but bone, tendon or muscle are NOT exposed.  Wound Description (Comments): prevalon boots and foam heel protectors intact  Present on Admission: Yes     Pressure Injury 04/28/21 Heel Left Deep Tissue Pressure Injury - Purple or maroon localized area of discolored intact skin or blood-filled blister due to damage of underlying soft tissue from pressure and/or shear. blood filled blister left heel (Active)  04/28/21 2000  Location: Heel  Location Orientation: Left  Staging: Deep Tissue Pressure Injury - Purple or maroon localized area of discolored intact skin or blood-filled blister due to damage of underlying soft tissue from pressure and/or shear.  Wound Description (Comments): blood filled blister left heel  Present on Admission: Yes     Pressure Injury 04/29/21 Sacrum Medial Unstageable -  Full thickness tissue loss in which the base of the injury is covered by slough (yellow, tan, gray, green or brown) and/or eschar (tan, brown or black) in the wound bed. PT only, large painfu wound (Active)  04/29/21 1130  Location: Sacrum  Location Orientation: Medial  Staging: Unstageable - Full thickness tissue loss in which the base of the injury is covered by slough (yellow, tan,  gray, green or brown) and/or eschar (tan, brown or black) in the wound bed.  Wound Description (Comments): PT only, large painfu wound  Present on Admission: Yes    Family Communication: Chris Payne step son- [phone] Level of Care: Level of care: Med-Surg Disposition Plan:  Status is: Inpatient  Remains inpatient appropriate because: cont IV abx while waiting on palliative care discussions   Consultants:  Palliative care   Antimicrobials:  Anti-infectives (From admission, onward)    Start     Dose/Rate Route Frequency Ordered Stop   05/15/21 1200  vancomycin (VANCOREADY) IVPB 750 mg/150 mL  Status:  Discontinued        750 mg 150 mL/hr over 60 Minutes Intravenous Every 12 hours 05/14/21 2319 05/17/21 1529   05/15/21 0600  ceFEPIme (MAXIPIME) 2 g in sodium chloride 0.9 % 100 mL IVPB  Status:  Discontinued        2 g 200 mL/hr over 30 Minutes Intravenous Every 8 hours 05/14/21 2319 05/17/21 1529   05/14/21 2315  ceFEPIme (MAXIPIME) 2 g in sodium chloride 0.9 % 100 mL IVPB        2 g 200 mL/hr over 30 Minutes Intravenous  Once 05/14/21 2310 05/15/21 0129   05/14/21 2315  metroNIDAZOLE (FLAGYL) IVPB 500 mg        500 mg 100 mL/hr over 60 Minutes Intravenous  Once 05/14/21 2310 05/15/21 0232   05/14/21 2315  vancomycin (VANCOCIN) IVPB 1000 mg/200 mL premix  Status:  Discontinued        1,000 mg 200 mL/hr over 60 Minutes Intravenous  Once 05/14/21 2310 05/14/21 2312   05/14/21 2315  vancomycin (VANCOREADY) IVPB 1500 mg/300 mL        1,500 mg 150 mL/hr over 120 Minutes Intravenous  Once 05/14/21 2312 05/15/21 0434        Objective: Vitals:   05/17/21 0753 05/17/21 1404 05/17/21 1946 05/18/21 0605  BP: 119/79 125/76 (!) 115/57 (!) 112/54  Pulse: 98 (!) 106 82 75  Resp: 18 16 14 12   Temp: 98.1 F (36.7 C) 98.4 F (36.9 C) 98.6 F (37 C) (!) 97.5 F (36.4 C)  TempSrc: Oral Oral    SpO2: 99% 97% 95% 91%    Intake/Output Summary (Last 24 hours) at 05/18/2021 1224 Last  data filed at 05/18/2021 1200 Gross per 24 hour  Intake 0 ml  Output 1000 ml  Net -1000 ml    There were no vitals filed for this visit.  Examination: Appears comfortable in bed      Debbe Odea, MD [facility] Pager: [email or web address] 05/18/2021, 12:24 PM

## 2021-05-19 MED ORDER — MORPHINE SULFATE (CONCENTRATE) 10 MG /0.5 ML PO SOLN: 5.0000 mg | ORAL | 0 refills | Status: AC | PRN

## 2021-05-19 NOTE — Progress Notes (Signed)
° °  I have been in contact with the son this am and he will update me as soon as the equipment is in the home by email. We are hoping that we will be able to get him home today after the equipment is ordered. The pt's wife and the son's fiancee will be at home all day waiting on this to happen. The pt will need to go home by ambulance.   I will update the CM with TOC as soon as I know more.   [name] RN [phone]

## 2021-05-19 NOTE — TOC Transition Note (Signed)
Transition of Care [facility]) - CM/SW Discharge Note   Patient Details  Name: JERIMY JOHANSON MRN: [id number] Date of Birth: 10/15/1950  Transition of Care [facility]) CM/SW Contact:  Bartholomew Crews, RN Phone Number: [phone] 05/19/2021, 12:42 PM   Clinical Narrative:     Damaris Schooner with Cherie at [facility]. DME to be delivered to home today. Verified home address. Upon DME delivery ambulance transport to be arranged.   Final next level of care: Home w Hospice Care Barriers to Discharge: Barriers Resolved   Patient Goals and CMS Choice Patient states their goals for this hospitalization and ongoing recovery are:: home with hospice care      Discharge Placement                       Discharge Plan and Services                                     Social Determinants of Health (SDOH) Interventions     Readmission Risk Interventions No flowsheet data found.

## 2021-05-19 NOTE — TOC Transition Note (Signed)
Transition of Care [facility]) - CM/SW Discharge Note   Patient Details  Name: Chris Payne MRN: [id number] Date of Birth: 08/08/1950  [facility]) CM/SW Contact:  Bartholomew Crews, RN Phone Number: [phone] 05/19/2021, 4:34 PM   Clinical Narrative:     Spoke with Cherie at [facility]. DME to be delivered within 2 hours. Agreeable to PTAR arrangements for 6pm pick up. Appreciate CSW assistance with transportation set up. No further TOC needs identified.   Final next level of care: Home w Hospice Care Barriers to Discharge: Barriers Resolved   Patient Goals and CMS Choice Patient states their goals for this hospitalization and ongoing recovery are:: home with hospice care      Discharge Placement                       Discharge Plan and Services                                     Social Determinants of Health (SDOH) Interventions     Readmission Risk Interventions No flowsheet data found.

## 2021-05-19 NOTE — Progress Notes (Signed)
° °  Palliative Medicine Inpatient Follow Up Note  Consulting Provider: Debbe Odea, MD   Reason for consult:   [place] Palliative Medicine Consult  Reason for Consult? [place], lives with stepson who states he is the main decision maker> [name] [phone]    Recurrent admissions, chronic foley stage 4 sacral decubitus that will never heal    HPI:  Per intake H&P --> Chris Payne is a 71 year old male who lives at home has BPH with urinary retention and a chronic Foley, stage IV sacral decubitus ulcer, PAD, CVA, CAD status post CABG, HFrEF, hypertension.  He is bedbound.    Palliative care has been asked to get involved to address goals of care in the setting of a notable decline.  Today's Discussion (05/19/2021):  *Please note that this is a verbal dictation therefore any spelling or grammatical errors are due to the "[place] One" system interpretation.  Chart reviewed inclusive of progress notes, laboratory results, and diagnostic images.   I met at bedside with Chris Payne this morning. He was awake when spoken to. We reviewed the plan to get him home which he seemed fairly content with.  Morphine at present dosing dose seeming to be relieving patient symptoms.  Patient son, Toula Moos did not call the PMT back last night though per [name] they continue to correspond by email and a plan for discharge will be set in place once DME are delivered.  Objective Assessment: Vital Signs Vitals:   05/18/21 2017 05/18/21 2242  BP: 114/80   Pulse: (!) 48 (!) 102  Resp: 18   Temp: 97.6 F (36.4 C)   SpO2: (!) 85% 94%    Intake/Output Summary (Last 24 hours) at 05/19/2021 0740 Last data filed at 05/18/2021 2249 Gross per 24 hour  Intake 300 ml  Output --  Net 300 ml   Gen:  Elderly Caucasian M in NAD HEENT: moist mucous membranes CV: Regular rate and rhythm  PULM:  On 2LPM [place] ABD: soft/nontender  EXT: No edema  Neuro: Somnolent  SUMMARY  OF RECOMMENDATIONS   DNAR/DNI   Comfort Measures   Medications per MAR - continue low dose morphine ATC to aid in generalized pain   Spiritual support   Plan for patient to discharge home with [facility] once DME are delivered   Ongoing palliative care support  MDM - High in the setting of review of opioids via parenteral route.  Medical Decision Making: 4 #/Complex Problems: 4                     Data Reviewed:    4             Management: 4 (1-Straightforward, 2-Low, 3-Moderate, 4-High) ______________________________________________________________________________________ [place] Team Team Cell Phone: [phone] Please utilize secure chat with additional questions, if there is no response within 30 minutes please call the above phone number  Palliative Medicine Team providers are available by phone from 7am to 7pm daily and can be reached through the team cell phone.  Should this patient require assistance outside of these hours, please call the patient's attending physician.

## 2021-05-19 NOTE — Care Management Important Message (Signed)
Important Message  Patient Details  Name: Chris Payne MRN: [id number] Date of Birth: 1950-12-18   Medicare Important Message Given:  Yes     Chris Payne 05/19/2021, 3:37 PM

## 2021-05-19 NOTE — Discharge Summary (Signed)
Physician Discharge Summary  Chris Payne FGH:[id number] DOB: October 25, 1950 DOA: 05/14/2021  PCP: Benito Mccreedy, MD  Admit date: 05/14/2021 Discharge date: 05/19/2021  Admitted From: Home with stepson Disposition: Same  Recommendations for Outpatient Follow-up:  Will be going home with hospice  Consultations: Palliative care   Discharge Diagnoses:  Principal Problem:   Sepsis ([place]) Active Problems:   Acute metabolic encephalopathy   Unstageable pressure ulcer of sacral region ([place])   Pressure injury of heel, unstageable ([place])   Chronic indwelling Foley catheter      Brief Summary: Chris Payne is a 71 year old male who lives at home has BPH with urinary retention and a chronic Foley, stage IV sacral decubitus ulcer, PAD, CVA, CAD status post CABG, HFrEF, hypertension.  He is bedbound.   He was in the hospital from 04/27/2021 through 05/03/2021 with sepsis thought to be secondary to a UTI.  He was discharged home with 4 days of Augmentin.   He returns for pain in the penis and was noted to have an obstructed Foley catheter was changed in the ED.   In ED: Foley was replaced over a liter of urine was obtained. He was noted to have a temperature of 100.6 rectally, WBC count 15.1 and heart rates greater than 100   He was given Vanc and cefepime and an MRI of the sacrum was ordered to look for osteomyelitis.  Hospital Course:  Principal Problem: Severe sepsis ([place]) with mental status changes- POA likely secondary to infection of stage IV sacral ulcer - leukocytosis, tachycardia and a fever - Could be that his decubitus ulcer that is infected - MRI does show osteomyelitis - transitioned to comfort care on 1/21 after further conversation between myself, the son and palliative care- appreciate palliative care assistance-he will be going home with hospice services     Active Problems:   Confusion - Possibly has acute metabolic encephalopathy    Chronic Foley with  obstruction Hypokalemia and hypomagnesemia Hypertension  Multiple chronic pressure ulcers Pressure Injury 03/27/21 Sacrum Unstageable - Full thickness tissue loss in which the base of the injury is covered by slough (yellow, tan, gray, green or brown) and/or eschar (tan, brown or black) in the wound bed. necrotic tissue and slough (Active)  03/27/21 1345  Location: Sacrum  Location Orientation:   Staging: Unstageable - Full thickness tissue loss in which the base of the injury is covered by slough (yellow, tan, gray, green or brown) and/or eschar (tan, brown or black) in the wound bed.  Wound Description (Comments): necrotic tissue and slough  Present on Admission: Yes     Pressure Injury 04/28/21 Foot Left;Posterior Stage 3 -  Full thickness tissue loss. Subcutaneous fat may be visible but bone, tendon or muscle are NOT exposed. prevalon boots and foam heel protectors intact (Active)  04/28/21 2000  Location: Foot (lateral heel)  Location Orientation: Left;Posterior  Staging: Stage 3 -  Full thickness tissue loss. Subcutaneous fat may be visible but bone, tendon or muscle are NOT exposed.  Wound Description (Comments): prevalon boots and foam heel protectors intact  Present on Admission: Yes     Pressure Injury 04/28/21 Heel Left Deep Tissue Pressure Injury - Purple or maroon localized area of discolored intact skin or blood-filled blister due to damage of underlying soft tissue from pressure and/or shear. blood filled blister left heel (Active)  04/28/21 2000  Location: Heel  Location Orientation: Left  Staging: Deep Tissue Pressure Injury - Purple or maroon localized area of discolored intact  skin or blood-filled blister due to damage of underlying soft tissue from pressure and/or shear.  Wound Description (Comments): blood filled blister left heel  Present on Admission: Yes     Pressure Injury 04/29/21 Sacrum Medial Unstageable - Full thickness tissue loss in which the base of the  injury is covered by slough (yellow, tan, gray, green or brown) and/or eschar (tan, brown or black) in the wound bed. PT only, large painfu wound (Active)  04/29/21 1130  Location: Sacrum  Location Orientation: Medial  Staging: Unstageable - Full thickness tissue loss in which the base of the injury is covered by slough (yellow, tan, gray, green or brown) and/or eschar (tan, brown or black) in the wound bed.  Wound Description (Comments): PT only, large painfu wound  Present on Admission: Yes      Discharge Exam: Vitals:   05/18/21 2017 05/18/21 2242  BP: 114/80   Pulse: (!) 48 (!) 102  Resp: 18   Temp: 97.6 F (36.4 C)   SpO2: (!) 85% 94%   Vitals:   05/17/21 1946 05/18/21 0605 05/18/21 2017 05/18/21 2242  BP: (!) 115/57 (!) 112/54 114/80   Pulse: 82 75 (!) 48 (!) 102  Resp: 14 12 18    Temp: 98.6 F (37 C) (!) 97.5 F (36.4 C) 97.6 F (36.4 C)   TempSrc:      SpO2: 95% 91% (!) 85% 94%     Discharge Instructions  Discharge Instructions     Diet - low sodium heart healthy   Complete by: As directed    No wound care   Complete by: As directed       Allergies as of 05/19/2021   No Known Allergies      Medication List     STOP taking these medications    albuterol 108 (90 Base) MCG/ACT inhaler Commonly known as: VENTOLIN HFA   aspirin 81 MG EC tablet   budesonide-formoterol 160-4.5 MCG/ACT inhaler Commonly known as: Symbicort   Ensure   furosemide 20 MG tablet Commonly known as: LASIX   gabapentin 300 MG capsule Commonly known as: NEURONTIN   metFORMIN 500 MG tablet Commonly known as: GLUCOPHAGE   metoprolol tartrate 25 MG tablet Commonly known as: LOPRESSOR   pantoprazole 40 MG tablet Commonly known as: PROTONIX   rosuvastatin 40 MG tablet Commonly known as: CRESTOR   simethicone 80 MG chewable tablet Commonly known as: MYLICON   tamsulosin 0.4 MG Caps capsule Commonly known as: FLOMAX       TAKE these medications     acetaminophen 500 MG tablet Commonly known as: TYLENOL Take 1,000 mg by mouth every 6 (six) hours as needed for mild pain.   collagenase ointment Commonly known as: SANTYL Apply topically daily.        No Known Allergies    MR LUMBAR SPINE WO CONTRAST  Result Date: 05/15/2021 CLINICAL DATA:  Low back pain, infection suspected EXAM: MRI LUMBAR SPINE WITHOUT CONTRAST TECHNIQUE: Multiplanar, multisequence MR imaging of the lumbar spine was performed. No intravenous contrast was administered. COMPARISON:  Lumbar spine MRI 08/16/2015, same-day CT abdomen/pelvis FINDINGS: Segmentation: Standard; the lowest formed disc space is designated L5-S1. Alignment: There is trace retrolisthesis of L5 on S1, unchanged since 2017. Alignment at the other levels is normal. Vertebrae: Vertebral body heights are preserved. A focus of T1 and T2 hyperintensity in the L3 vertebral body is unchanged, likely an intraosseous hemangioma. There is no suspicious marrow signal abnormality. There is no marrow edema. There is  a prominent Schmorl's node indenting the superior L5 endplate without associated edema. There is edema in the lower sacrum with surrounding soft tissue edema, better evaluated on the separately dictated sacrum MRI. There is no osseous destruction to suggest discitis/osteomyelitis. Conus medullaris and cauda equina: Conus extends to the L1 level. Conus and cauda equina appear normal. Paraspinal and other soft tissues: The paraspinal soft tissues are unremarkable. A saccular infrarenal abdominal aortic aneurysm is again seen, better evaluated on the same-day CT abdomen/pelvis. Disc levels: There is marked intervertebral disc space narrowing at L5-S1. Facet arthropathy is most advanced at L4-L5 T12-L1: There is mild bilateral facet arthropathy without significant spinal canal or neural foraminal stenosis L1-L2: Mild degenerative endplate change and bilateral facet arthropathy without significant spinal canal  or neural foraminal stenosis. L2-L3: There is a mild disc bulge and mild bilateral facet arthropathy without significant spinal canal or neural foraminal stenosis. L3-L4: There is a mild disc bulge and mild bilateral facet arthropathy resulting in mild left worse than right neural foraminal stenosis without significant spinal canal stenosis L4-L5: There is a mild disc bulge and moderate bilateral facet arthropathy resulting in mild bilateral neural foraminal stenosis without significant spinal canal stenosis L5-S1: There is a diffuse disc bulge with bilateral foraminal components, degenerative endplate change, and bilateral facet arthropathy resulting in moderate right and mild left neural foraminal stenosis. Overall, the above findings are not significantly changed since 2017. IMPRESSION: 1. Bone and soft tissue edema in the lower sacrum consistent with osteomyelitis, better evaluated on the separately dictated sacrum MRI. 2. No other evidence of discitis/osteomyelitis in the lumbar spine. 3. Moderate facet arthropathy at L4-L5. 4. Degenerative changes at L5-S1 result in moderate right and mild left neural foraminal stenosis, not significantly changed since 2017. 5. Mild degenerative changes at the remaining levels as detailed above without significant spinal canal or neural foraminal stenosis, also not significantly changed since 2017. 6. Saccular infrarenal abdominal aortic aneurysm, better evaluated on prior CT. Electronically Signed   By: Valetta Mole M.D.   On: 05/15/2021 08:25   CT Abdomen Pelvis W Contrast  Result Date: 05/15/2021 CLINICAL DATA:  Abdominal pain, dark urine.  Difficulty walking. EXAM: CT ABDOMEN AND PELVIS WITH CONTRAST TECHNIQUE: Multidetector CT imaging of the abdomen and pelvis was performed using the standard protocol following bolus administration of intravenous contrast. RADIATION DOSE REDUCTION: This exam was performed according to the departmental dose-optimization program which  includes automated exposure control, adjustment of the mA and/or kV according to patient size and/or use of iterative reconstruction technique. CONTRAST:  6mL OMNIPAQUE IOHEXOL 300 MG/ML  SOLN COMPARISON:  04/27/2021. FINDINGS: Lower chest: The heart is enlarged and there is no pericardial effusion. Scattered coronary artery calcifications are noted. There is aneurysmal dilatation of the ascending aorta measuring 4.1 cm. There is bilateral lower lobe bronchiectasis with bronchial wall thickening. Consolidation is present in the left lung base and patchy airspace disease is noted at the right lung base. No no pleural effusion. Hepatobiliary: Multiple scattered cysts are present in the liver and unchanged from the prior exam. The gallbladder is distended measuring 5.3 cm and a few stones are identified. No biliary ductal dilatation. Pancreas: There is fatty atrophy of the pancreas. No pancreatic ductal dilatation or surrounding fat stranding. Spleen: Normal in size without focal abnormality. Adrenals/Urinary Tract: There is a stable nodule in the right adrenal gland measuring 1.5 cm. The left adrenal gland is within normal limits. No renal calculus or obstructive uropathy bilaterally. The kidneys enhance symmetrically.  Subcentimeter hypodensities are present in the kidneys bilaterally and too small to further characterize. A Foley catheter is present in the urinary bladder and the bladder is nondistended. Stomach/Bowel: The stomach is within normal limits. No bowel obstruction, free air or pneumatosis. A normal appendix is present in the right lower quadrant. The rectum distends below the level of the pubococcygeal line suggesting pelvic floor dysfunction. There is a large amount of stool in the rectum. A few scattered diverticula are present along the colon without evidence of diverticulitis. No focal bowel wall thickening. Vascular/Lymphatic: Atherosclerotic calcification of the aorta. There is redemonstration of a  saccular infrarenal abdominal aortic aneurysm measuring 5.1 cm, not significantly changed from the prior exam. Aortobifemoral bypass grafts are noted. There is severe stenosis at the left femoral graft anastomosis. No abdominal or pelvic lymphadenopathy. Reproductive: The prostate gland is enlarged and contains multiple brachytherapy seeds and/or surgical clips. Other: A fat containing umbilical hernia is noted.  No free fluid. Musculoskeletal: Fixation hardware is noted at the left hip. Degenerative changes are present in the thoracolumbar spine. Sternotomy wires are noted over the midline. There is a skin defect over the posterior aspect of the sacrum and coccyx, unchanged from the prior exam. IMPRESSION: 1. No renal calculus or obstructive uropathy bilaterally. A Foley catheter is present in the urinary bladder in the bladder is decompressed. 2. Bronchiectasis with left lower lobe consolidation and patchy airspace disease at the lung bases bilaterally, concerning for pneumonia. 3. Distended gallbladder with cholelithiasis. 4. Large amount of stool in the rectum and moderate amount of retained stool in the colon, possible constipation. 5. Remaining chronic changes as described above. Electronically Signed   By: Brett Fairy M.D.   On: 05/15/2021 00:46   CT ABDOMEN PELVIS W CONTRAST  Result Date: 04/28/2021 CLINICAL DATA:  Gross hematuria.  Indwelling catheter. EXAM: CT ABDOMEN AND PELVIS WITH CONTRAST TECHNIQUE: Multidetector CT imaging of the abdomen and pelvis was performed using the standard protocol following bolus administration of intravenous contrast. CONTRAST:  30mL OMNIPAQUE IOHEXOL 350 MG/ML SOLN COMPARISON:  CT without contrast 03/26/2021, CT with contrast 09/15/2020 FINDINGS: Lower chest: There is scattered linear scarring or atelectasis. Right lower lobe posterior basal bronchiolar thickening and multifocal bronchiolar impaction are increased since the last CT, without visible bronchopneumonia.  There is cardiomegaly with sternotomy sutures and native calcific CAD. Hepatobiliary: Stable hepatic cysts. 2.8 cm single stone in the mid gallbladder lumen with mild gallbladder dilatation without wall thickening or biliary dilatation. Pancreas: Moderate diffuse fatty atrophy. No mass enhancement or ductal dilatation. Spleen: Normal. Adrenals/Urinary Tract: Stable 1.5 cm right adrenal adenoma. Normal left adrenal. Few tiny subcentimeter too small to characterize hypodensities again noted right kidney with cortical scarring anterior left lower pole. No mass enhancement, renal calculus or hydronephrosis is seen. The bladder was previously markedly dilated now is catheterized contracted and not well seen. There are no perivesical inflammatory changes but the bladder wall could be at least mildly thickened. There are few tiny stones in the right side of the lumen. Stomach/Bowel: No dilatation or wall thickening including the appendix. Moderate fecal stasis. Scattered uncomplicated colonic diverticula. Vascular/Lymphatic: Patent aortobifemoral bypass grafting but with severe stenosis at the left femoral graft surgical anastomosis. This was seen previously. Native aortoiliac atherosclerosis is again noted with 5.4 cm thrombosed saccular infrarenal AAA , not significantly changed. Reproductive: Prominent prostate gland measuring 5.2 cm. Brachytherapy seeds and/or surgical clips in the prostate are again shown. Other: Small umbilical and inguinal fat hernias. No free  air, hemorrhage or fluid. Musculoskeletal: Skin breakdown over the dorsal aspect of the sacrococcygeal junction occurred since the prior study. Underlying osteomyelitis is not excluded. Left hip chronic arthrodesis pinning is again shown, osteopenia and degenerative changes of the lumbar spine and severe L5-S1 degenerative foraminal stenosis. IMPRESSION: 1. The bladder, previously markedly distended has been catheterized since the last CT and is now  decompressed and not well seen but could be thickened. There are few tiny stones in the right side of the lumen. 2. Interval skin breakdown over the dorsal sacrococcygeal junction. Underlying osteomyelitis not excluded but no frank destructive lesion is evident. 3. Distended gallbladder with single 2.8 cm stone but no wall thickening or biliary dilatation. 4. Aortobifemoral bypass grafting with severe stenosis at the left femoral graft tie-in. 5. 5.4 cm thrombosed saccular infrarenal AAA , not significantly changed. Vascular surgery follow-up suggested unless already scheduled. 6. Multifocal bronchiolar impaction in the right lower lobe. 7. Hepatic cysts and other chronic findings discussed above. Electronically Signed   By: Telford Nab M.D.   On: 04/28/2021 00:19   MR SACRUM SI JOINTS WO CONTRAST  Result Date: 05/15/2021 CLINICAL DATA:  Sacral decubitus ulcer. EXAM: MRI SACRUM WITHOUT CONTRAST TECHNIQUE: Multiplanar, multisequence MR imaging of the sacrum was performed. No intravenous contrast was administered. COMPARISON:  CT abdomen pelvis from same day. FINDINGS: Bones/Joint/Cartilage Abnormal marrow edema with corresponding decreased T1 marrow signal involving the lower sacrum and coccyx, with partial bony destruction of the coccyx, new since last November. No fracture or dislocation. Normal sacroiliac joints. No joint effusion. Muscles and Tendons Left greater than right pelvic muscle atrophy. Soft tissue Midline sacral decubitus ulcer extending to bone. Small fluid collections anterior and posterior to the lower sacrum measuring up to 1.6 cm (series 4, image 21). No soft tissue mass. Large stool ball in the rectum.  Foley catheter within the bladder. IMPRESSION: 1. Midline sacral decubitus ulcer extending to bone with underlying osteomyelitis of the lower sacrum and coccyx, new since November 2022. 2. Small fluid collections anterior and posterior to the lower sacrum measuring up to 1.6 cm, likely  small abscesses. Electronically Signed   By: Titus Dubin M.D.   On: 05/15/2021 08:19   DG Chest Port 1 View  Result Date: 05/14/2021 CLINICAL DATA:  Possible sepsis EXAM: PORTABLE CHEST 1 VIEW COMPARISON:  04/27/2021 FINDINGS: Cardiac shadow is within normal limits. Postsurgical changes are noted. The lungs are well aerated bilaterally. Left basilar atelectasis is noted with mild blunting of the costophrenic angle new from the prior exam. IMPRESSION: Left basilar atelectasis and small effusion. Electronically Signed   By: Inez Catalina M.D.   On: 05/14/2021 23:21   DG Chest Port 1 View  Result Date: 04/27/2021 CLINICAL DATA:  Pain. EXAM: PORTABLE CHEST 1 VIEW COMPARISON:  Chest x-ray 03/26/2021. FINDINGS: The aorta is tortuous, unchanged. The heart is enlarged, unchanged. The lungs and costophrenic angles are clear. There is no pneumothorax. No acute fractures are seen. IMPRESSION: 1. No acute cardiopulmonary process. 2. Stable cardiomegaly. Electronically Signed   By: Ronney Asters M.D.   On: 04/27/2021 18:24     The results of significant diagnostics from this hospitalization (including imaging, microbiology, ancillary and laboratory) are listed below for reference.     Microbiology: Recent Results (from the past 240 hour(s))  Resp Panel by RT-PCR (Flu A&B, Covid) Nasopharyngeal Swab     Status: None   Collection Time: 05/14/21 11:10 PM   Specimen: Nasopharyngeal Swab; Nasopharyngeal(NP) swabs in vial transport  medium  Result Value Ref Range Status   SARS Coronavirus 2 by RT PCR NEGATIVE NEGATIVE Final    Comment: (NOTE) SARS-CoV-2 target nucleic acids are NOT DETECTED.  The SARS-CoV-2 RNA is generally detectable in upper respiratory specimens during the acute phase of infection. The lowest concentration of SARS-CoV-2 viral copies this assay can detect is 138 copies/mL. A negative result does not preclude SARS-Cov-2 infection and should not be used as the sole basis for treatment  or other patient management decisions. A negative result may occur with  improper specimen collection/handling, submission of specimen other than nasopharyngeal swab, presence of viral mutation(s) within the areas targeted by this assay, and inadequate number of viral copies(<138 copies/mL). A negative result must be combined with clinical observations, patient history, and epidemiological information. The expected result is Negative.  Fact Sheet for Patients:  EntrepreneurPulse.com.au  Fact Sheet for Healthcare Providers:  IncredibleEmployment.be  This test is no t yet approved or cleared by the [place] FDA and  has been authorized for detection and/or diagnosis of SARS-CoV-2 by FDA under an Emergency Use Authorization (EUA). This EUA will remain  in effect (meaning this test can be used) for the duration of the COVID-19 declaration under Section 564(b)(1) of the Act, 21 U.S.C.section 360bbb-3(b)(1), unless the authorization is terminated  or revoked sooner.       Influenza A by PCR NEGATIVE NEGATIVE Final   Influenza B by PCR NEGATIVE NEGATIVE Final    Comment: (NOTE) The Xpert Xpress SARS-CoV-2/FLU/RSV plus assay is intended as an aid in the diagnosis of influenza from Nasopharyngeal swab specimens and should not be used as a sole basis for treatment. Nasal washings and aspirates are unacceptable for Xpert Xpress SARS-CoV-2/FLU/RSV testing.  Fact Sheet for Patients: EntrepreneurPulse.com.au  Fact Sheet for Healthcare Providers: IncredibleEmployment.be  This test is not yet approved or cleared by the [place] FDA and has been authorized for detection and/or diagnosis of SARS-CoV-2 by FDA under an Emergency Use Authorization (EUA). This EUA will remain in effect (meaning this test can be used) for the duration of the COVID-19 declaration under Section 564(b)(1) of the Act, 21 U.S.C. section  360bbb-3(b)(1), unless the authorization is terminated or revoked.  Performed at [facility], [place] [address] [place], [place] [address]   Urine Culture     Status: Abnormal   Collection Time: 05/14/21 11:10 PM   Specimen: In/Out Cath Urine  Result Value Ref Range Status   Specimen Description IN/OUT CATH URINE  Final   Special Requests   Final    NONE Performed at [facility], [place] [address], [place], [place] [address]    Culture >=100,000 COLONIES/mL YEAST (A)  Final   Report Status 05/16/2021 FINAL  Final  Blood Culture (routine x 2)     Status: None (Preliminary result)   Collection Time: 05/15/21 12:43 AM   Specimen: BLOOD  Result Value Ref Range Status   Specimen Description BLOOD BLOOD LEFT WRIST  Final   Special Requests   Final    BOTTLES DRAWN AEROBIC AND ANAEROBIC Blood Culture adequate volume   Culture   Final    NO GROWTH 4 DAYS Performed at [facility], [name] [address] [place], [place] [address]    Report Status PENDING  Incomplete     Labs: BNP (last 3 results) Recent Labs    09/15/20 0957 03/08/21 1251 03/12/21 0251  BNP 43.9 86.1 93.8   Basic Metabolic Panel: Recent Labs  Lab  05/14/21 2213 05/15/21 0432 05/16/21 0751  NA 134* 135 133*  K 3.4* 3.1* 2.7*  CL 98 101 102  CO2 25 26 26   GLUCOSE 142* 110* 86  BUN 23 22 14   CREATININE 0.81 0.66 0.63  CALCIUM 8.8* 8.2* 8.1*  MG  --   --  1.4*   Liver Function Tests: Recent Labs  Lab 05/14/21 2213 05/15/21 0432  AST 27 20  ALT 26 21  ALKPHOS 103 86  BILITOT 1.1 0.6  PROT 6.7 5.6*  ALBUMIN 2.1* 1.7*   No results for input(s): LIPASE, AMYLASE in the last 168 hours. No results for input(s): AMMONIA in the last 168 hours. CBC: Recent Labs  Lab 05/14/21 2213 05/15/21 0432  WBC 15.1* 11.9*  NEUTROABS 12.6*  --   HGB 13.2 11.0*  HCT 41.5 35.8*  MCV 105.6* 106.2*  PLT 288 265   Cardiac Enzymes: No results for input(s): CKTOTAL, CKMB, CKMBINDEX, TROPONINI in the  last 168 hours. BNP: Invalid input(s): POCBNP CBG: Recent Labs  Lab 05/16/21 1644 05/16/21 2116 05/17/21 0754 05/17/21 1130 05/17/21 1539  GLUCAP 73 118* 102* 110* 111*   D-Dimer No results for input(s): DDIMER in the last 72 hours. Hgb A1c No results for input(s): HGBA1C in the last 72 hours. Lipid Profile No results for input(s): CHOL, HDL, LDLCALC, TRIG, CHOLHDL, LDLDIRECT in the last 72 hours. Thyroid function studies No results for input(s): TSH, T4TOTAL, T3FREE, THYROIDAB in the last 72 hours.  Invalid input(s): FREET3 Anemia work up No results for input(s): VITAMINB12, FOLATE, FERRITIN, TIBC, IRON, RETICCTPCT in the last 72 hours. Urinalysis    Component Value Date/Time   COLORURINE ORANGE (A) 05/14/2021 2159   APPEARANCEUR TURBID (A) 05/14/2021 2159   LABSPEC >1.030 (H) 05/14/2021 2159   PHURINE 6.0 05/14/2021 2159   GLUCOSEU NEGATIVE 05/14/2021 2159   HGBUR LARGE (A) 05/14/2021 2159   BILIRUBINUR SMALL (A) 05/14/2021 2159   BILIRUBINUR small 08/23/2013 [place] 05/14/2021 2159   PROTEINUR 100 (A) 05/14/2021 2159   UROBILINOGEN 1.0 02/08/2015 1902   NITRITE NEGATIVE 05/14/2021 2159   LEUKOCYTESUR MODERATE (A) 05/14/2021 2159   Sepsis Labs Invalid input(s): PROCALCITONIN,  WBC,  LACTICIDVEN Microbiology Recent Results (from the past 240 hour(s))  Resp Panel by RT-PCR (Flu A&B, Covid) Nasopharyngeal Swab     Status: None   Collection Time: 05/14/21 11:10 PM   Specimen: Nasopharyngeal Swab; Nasopharyngeal(NP) swabs in vial transport medium  Result Value Ref Range Status   SARS Coronavirus 2 by RT PCR NEGATIVE NEGATIVE Final    Comment: (NOTE) SARS-CoV-2 target nucleic acids are NOT DETECTED.  The SARS-CoV-2 RNA is generally detectable in upper respiratory specimens during the acute phase of infection. The lowest concentration of SARS-CoV-2 viral copies this assay can detect is 138 copies/mL. A negative result does not preclude  SARS-Cov-2 infection and should not be used as the sole basis for treatment or other patient management decisions. A negative result may occur with  improper specimen collection/handling, submission of specimen other than nasopharyngeal swab, presence of viral mutation(s) within the areas targeted by this assay, and inadequate number of viral copies(<138 copies/mL). A negative result must be combined with clinical observations, patient history, and epidemiological information. The expected result is Negative.  Fact Sheet for Patients:  EntrepreneurPulse.com.au  Fact Sheet for Healthcare Providers:  IncredibleEmployment.be  This test is no t yet approved or cleared by the [place] FDA and  has been authorized for detection and/or diagnosis of SARS-CoV-2 by  FDA under an Emergency Use Authorization (EUA). This EUA will remain  in effect (meaning this test can be used) for the duration of the COVID-19 declaration under Section 564(b)(1) of the Act, 21 U.S.C.section 360bbb-3(b)(1), unless the authorization is terminated  or revoked sooner.       Influenza A by PCR NEGATIVE NEGATIVE Final   Influenza B by PCR NEGATIVE NEGATIVE Final    Comment: (NOTE) The Xpert Xpress SARS-CoV-2/FLU/RSV plus assay is intended as an aid in the diagnosis of influenza from Nasopharyngeal swab specimens and should not be used as a sole basis for treatment. Nasal washings and aspirates are unacceptable for Xpert Xpress SARS-CoV-2/FLU/RSV testing.  Fact Sheet for Patients: EntrepreneurPulse.com.au  Fact Sheet for Healthcare Providers: IncredibleEmployment.be  This test is not yet approved or cleared by the [place] FDA and has been authorized for detection and/or diagnosis of SARS-CoV-2 by FDA under an Emergency Use Authorization (EUA). This EUA will remain in effect (meaning this test can be used) for the duration of  the COVID-19 declaration under Section 564(b)(1) of the Act, 21 U.S.C. section 360bbb-3(b)(1), unless the authorization is terminated or revoked.  Performed at [facility], [place] [address], [place], [place] [address]   Urine Culture     Status: Abnormal   Collection Time: 05/14/21 11:10 PM   Specimen: In/Out Cath Urine  Result Value Ref Range Status   Specimen Description IN/OUT CATH URINE  Final   Special Requests   Final    NONE Performed at [facility], [place] [address] [place], [place] [address]    Culture >=100,000 COLONIES/mL YEAST (A)  Final   Report Status 05/16/2021 FINAL  Final  Blood Culture (routine x 2)     Status: None (Preliminary result)   Collection Time: 05/15/21 12:43 AM   Specimen: BLOOD  Result Value Ref Range Status   Specimen Description BLOOD BLOOD LEFT WRIST  Final   Special Requests   Final    BOTTLES DRAWN AEROBIC AND ANAEROBIC Blood Culture adequate volume   Culture   Final    NO GROWTH 4 DAYS Performed at [facility], [place] [address] [place], [place] [address]    Report Status PENDING  Incomplete       SIGNED:   Debbe Odea, MD  [facility] 05/19/2021, 11:03 AM

## 2021-05-20 LAB — CULTURE, BLOOD (ROUTINE X 2)
Culture: NO GROWTH
Special Requests: ADEQUATE

## 2021-06-02 NOTE — Progress Notes (Signed)
[name] ([id number]) , Visit Report for 06/03/2021 Allergy List Details Patient Name: Date of Service: Chris Payne, Chris Payne 06/03/2021 9:00 A M Medical Record Number: [id number] Patient Account Number: [id number] Date of Birth/Sex: Treating RN: 04/18/1951 (71 y.o. [name] Primary Care [name]: Other Clinician: Referring [name]: Treating [name]/Extender: Cheree Ditto in Treatment: 0 Allergies Active Allergies No Known Allergies Allergy Notes Electronic Signature(s) Signed: 06/02/2021 4:08:36 PM By: Donavan Burnet CHT EMT BS , , Entered By: Donavan Burnet on 06/02/2021 16:08:36

## 2021-06-03 ENCOUNTER — Encounter (HOSPITAL_BASED_OUTPATIENT_CLINIC_OR_DEPARTMENT_OTHER): Payer: Medicare HMO | Attending: Internal Medicine | Admitting: Internal Medicine

## 2021-07-26 DEATH — deceased
